# Patient Record
Sex: Female | Born: 1935 | Race: White | Hispanic: No | Smoking: Never smoker
Health system: Southern US, Community
[De-identification: ages and names within clinical notes are randomized; demographics above are authoritative.]

## PROBLEM LIST (undated history)

## (undated) DIAGNOSIS — F0390 Unspecified dementia without behavioral disturbance: Secondary | ICD-10-CM

## (undated) DIAGNOSIS — I2510 Atherosclerotic heart disease of native coronary artery without angina pectoris: Secondary | ICD-10-CM

## (undated) DIAGNOSIS — I10 Essential (primary) hypertension: Secondary | ICD-10-CM

## (undated) DIAGNOSIS — IMO0001 Reserved for inherently not codable concepts without codable children: Secondary | ICD-10-CM

## (undated) DIAGNOSIS — E119 Type 2 diabetes mellitus without complications: Secondary | ICD-10-CM

## (undated) DIAGNOSIS — E7800 Pure hypercholesterolemia, unspecified: Secondary | ICD-10-CM

## (undated) DIAGNOSIS — I639 Cerebral infarction, unspecified: Secondary | ICD-10-CM

## (undated) DIAGNOSIS — I509 Heart failure, unspecified: Secondary | ICD-10-CM

## (undated) DIAGNOSIS — N189 Chronic kidney disease, unspecified: Secondary | ICD-10-CM

## (undated) DIAGNOSIS — M47816 Spondylosis without myelopathy or radiculopathy, lumbar region: Secondary | ICD-10-CM

## (undated) DIAGNOSIS — Z794 Long term (current) use of insulin: Secondary | ICD-10-CM

## (undated) HISTORY — PX: APPENDECTOMY: SHX54

## (undated) HISTORY — PX: KNEE SURGERY: SHX244

## (undated) HISTORY — DX: Atherosclerotic heart disease of native coronary artery without angina pectoris: I25.10

## (undated) HISTORY — DX: Long term (current) use of insulin: Z79.4

## (undated) HISTORY — DX: Reserved for inherently not codable concepts without codable children: IMO0001

## (undated) HISTORY — DX: Type 2 diabetes mellitus without complications: E11.9

## (undated) HISTORY — DX: Cerebral infarction, unspecified: I63.9

## (undated) HISTORY — DX: Chronic kidney disease, unspecified: N18.9

---

## 1994-11-28 DIAGNOSIS — I639 Cerebral infarction, unspecified: Secondary | ICD-10-CM

## 1994-11-28 HISTORY — DX: Cerebral infarction, unspecified: I63.9

## 2001-11-28 DIAGNOSIS — I2510 Atherosclerotic heart disease of native coronary artery without angina pectoris: Secondary | ICD-10-CM

## 2001-11-28 HISTORY — DX: Atherosclerotic heart disease of native coronary artery without angina pectoris: I25.10

## 2002-01-22 ENCOUNTER — Encounter: Payer: Self-pay | Admitting: Emergency Medicine

## 2002-01-22 ENCOUNTER — Inpatient Hospital Stay (HOSPITAL_COMMUNITY): Admission: EM | Admit: 2002-01-22 | Discharge: 2002-01-25 | Payer: Self-pay | Admitting: Emergency Medicine

## 2002-01-25 ENCOUNTER — Encounter: Payer: Self-pay | Admitting: Cardiology

## 2002-04-02 ENCOUNTER — Ambulatory Visit (HOSPITAL_COMMUNITY): Admission: RE | Admit: 2002-04-02 | Discharge: 2002-04-02 | Payer: Self-pay | Admitting: Unknown Physician Specialty

## 2002-04-09 ENCOUNTER — Emergency Department (HOSPITAL_COMMUNITY): Admission: EM | Admit: 2002-04-09 | Discharge: 2002-04-09 | Payer: Self-pay | Admitting: Emergency Medicine

## 2003-05-27 ENCOUNTER — Ambulatory Visit (HOSPITAL_COMMUNITY): Admission: RE | Admit: 2003-05-27 | Discharge: 2003-05-27 | Payer: Self-pay | Admitting: Obstetrics & Gynecology

## 2003-05-27 ENCOUNTER — Encounter: Payer: Self-pay | Admitting: Obstetrics & Gynecology

## 2003-06-12 ENCOUNTER — Ambulatory Visit (HOSPITAL_COMMUNITY): Admission: RE | Admit: 2003-06-12 | Discharge: 2003-06-12 | Payer: Self-pay | Admitting: Obstetrics & Gynecology

## 2004-11-02 ENCOUNTER — Ambulatory Visit: Payer: Self-pay | Admitting: Family Medicine

## 2005-02-14 ENCOUNTER — Ambulatory Visit: Payer: Self-pay | Admitting: Family Medicine

## 2005-03-28 ENCOUNTER — Ambulatory Visit: Payer: Self-pay | Admitting: Family Medicine

## 2005-07-27 ENCOUNTER — Ambulatory Visit: Payer: Self-pay | Admitting: Cardiology

## 2005-08-10 ENCOUNTER — Ambulatory Visit: Payer: Self-pay

## 2005-10-28 ENCOUNTER — Ambulatory Visit: Payer: Self-pay | Admitting: Cardiology

## 2007-09-26 ENCOUNTER — Ambulatory Visit: Payer: Self-pay | Admitting: Cardiology

## 2007-09-27 ENCOUNTER — Ambulatory Visit: Payer: Self-pay | Admitting: Cardiology

## 2007-09-27 ENCOUNTER — Inpatient Hospital Stay (HOSPITAL_COMMUNITY): Admission: AD | Admit: 2007-09-27 | Discharge: 2007-09-30 | Payer: Self-pay | Admitting: Cardiology

## 2007-10-17 ENCOUNTER — Ambulatory Visit: Payer: Self-pay | Admitting: Cardiology

## 2008-01-17 ENCOUNTER — Ambulatory Visit: Payer: Self-pay | Admitting: Cardiology

## 2008-01-17 ENCOUNTER — Ambulatory Visit: Payer: Self-pay | Admitting: Cardiovascular Disease

## 2008-02-13 ENCOUNTER — Ambulatory Visit: Payer: Self-pay | Admitting: Cardiology

## 2008-02-21 ENCOUNTER — Ambulatory Visit: Payer: Self-pay

## 2008-04-30 ENCOUNTER — Ambulatory Visit: Payer: Self-pay | Admitting: Cardiology

## 2008-12-05 DIAGNOSIS — E785 Hyperlipidemia, unspecified: Secondary | ICD-10-CM

## 2008-12-05 DIAGNOSIS — I2510 Atherosclerotic heart disease of native coronary artery without angina pectoris: Secondary | ICD-10-CM | POA: Insufficient documentation

## 2009-02-19 ENCOUNTER — Ambulatory Visit: Payer: Self-pay | Admitting: Cardiology

## 2009-03-31 ENCOUNTER — Encounter: Payer: Self-pay | Admitting: Cardiology

## 2009-09-09 ENCOUNTER — Ambulatory Visit: Payer: Self-pay | Admitting: Cardiology

## 2009-09-24 ENCOUNTER — Encounter (INDEPENDENT_AMBULATORY_CARE_PROVIDER_SITE_OTHER): Payer: Self-pay | Admitting: *Deleted

## 2010-03-09 ENCOUNTER — Ambulatory Visit: Payer: Self-pay | Admitting: Cardiology

## 2010-03-09 DIAGNOSIS — R609 Edema, unspecified: Secondary | ICD-10-CM

## 2010-03-09 DIAGNOSIS — I679 Cerebrovascular disease, unspecified: Secondary | ICD-10-CM

## 2010-03-26 ENCOUNTER — Ambulatory Visit (HOSPITAL_COMMUNITY): Admission: RE | Admit: 2010-03-26 | Discharge: 2010-03-26 | Payer: Self-pay | Admitting: Cardiology

## 2010-03-26 ENCOUNTER — Ambulatory Visit: Payer: Self-pay

## 2010-03-26 ENCOUNTER — Ambulatory Visit: Payer: Self-pay | Admitting: Cardiology

## 2010-03-26 ENCOUNTER — Ambulatory Visit: Payer: Self-pay | Admitting: Internal Medicine

## 2010-04-02 ENCOUNTER — Telehealth: Payer: Self-pay | Admitting: Cardiology

## 2010-04-04 LAB — CONVERTED CEMR LAB
BUN: 31 mg/dL — ABNORMAL HIGH (ref 6–23)
CO2: 30 meq/L (ref 19–32)
Calcium: 9.7 mg/dL (ref 8.4–10.5)
Chloride: 107 meq/L (ref 96–112)
Creatinine, Ser: 1.7 mg/dL — ABNORMAL HIGH (ref 0.4–1.2)
GFR calc non Af Amer: 31.07 mL/min (ref >60)
Glucose, Bld: 80 mg/dL (ref 70–99)
Potassium: 4.4 meq/L (ref 3.5–5.1)
Sodium: 142 meq/L (ref 135–145)

## 2010-04-22 ENCOUNTER — Telehealth (INDEPENDENT_AMBULATORY_CARE_PROVIDER_SITE_OTHER): Payer: Self-pay | Admitting: *Deleted

## 2010-06-19 ENCOUNTER — Inpatient Hospital Stay (HOSPITAL_COMMUNITY)
Admission: EM | Admit: 2010-06-19 | Discharge: 2010-06-20 | Payer: Self-pay | Source: Home / Self Care | Admitting: Emergency Medicine

## 2010-06-19 ENCOUNTER — Encounter (INDEPENDENT_AMBULATORY_CARE_PROVIDER_SITE_OTHER): Payer: Self-pay | Admitting: Internal Medicine

## 2010-06-19 ENCOUNTER — Ambulatory Visit: Payer: Self-pay | Admitting: Vascular Surgery

## 2010-07-04 ENCOUNTER — Observation Stay (HOSPITAL_COMMUNITY): Admission: EM | Admit: 2010-07-04 | Discharge: 2010-07-05 | Payer: Self-pay | Admitting: Emergency Medicine

## 2010-07-04 ENCOUNTER — Ambulatory Visit: Payer: Self-pay | Admitting: Internal Medicine

## 2010-07-05 ENCOUNTER — Encounter: Payer: Self-pay | Admitting: Cardiology

## 2010-07-16 ENCOUNTER — Encounter: Payer: Self-pay | Admitting: Physician Assistant

## 2010-07-20 ENCOUNTER — Ambulatory Visit: Payer: Self-pay | Admitting: Cardiology

## 2010-07-20 ENCOUNTER — Encounter: Payer: Self-pay | Admitting: Physician Assistant

## 2010-07-20 ENCOUNTER — Ambulatory Visit: Payer: Self-pay | Admitting: Cardiovascular Disease

## 2010-07-20 DIAGNOSIS — R7982 Elevated C-reactive protein (CRP): Secondary | ICD-10-CM

## 2010-07-20 DIAGNOSIS — R002 Palpitations: Secondary | ICD-10-CM

## 2010-07-20 DIAGNOSIS — I5032 Chronic diastolic (congestive) heart failure: Secondary | ICD-10-CM

## 2010-07-23 ENCOUNTER — Emergency Department (HOSPITAL_COMMUNITY): Admission: EM | Admit: 2010-07-23 | Discharge: 2010-07-23 | Payer: Self-pay | Admitting: Emergency Medicine

## 2010-07-30 ENCOUNTER — Telehealth: Payer: Self-pay | Admitting: Cardiology

## 2010-08-04 ENCOUNTER — Ambulatory Visit: Payer: Self-pay | Admitting: Cardiology

## 2010-09-01 ENCOUNTER — Telehealth: Payer: Self-pay | Admitting: Cardiology

## 2010-09-01 ENCOUNTER — Encounter: Payer: Self-pay | Admitting: Cardiology

## 2010-09-13 ENCOUNTER — Other Ambulatory Visit: Payer: Self-pay | Admitting: Obstetrics and Gynecology

## 2010-09-15 ENCOUNTER — Ambulatory Visit: Payer: Self-pay | Admitting: Cardiology

## 2010-09-16 ENCOUNTER — Telehealth (INDEPENDENT_AMBULATORY_CARE_PROVIDER_SITE_OTHER): Payer: Self-pay | Admitting: *Deleted

## 2010-09-29 ENCOUNTER — Telehealth (INDEPENDENT_AMBULATORY_CARE_PROVIDER_SITE_OTHER): Payer: Self-pay | Admitting: Radiology

## 2010-09-30 ENCOUNTER — Ambulatory Visit: Payer: Self-pay

## 2010-09-30 ENCOUNTER — Encounter: Payer: Self-pay | Admitting: Cardiology

## 2010-10-06 ENCOUNTER — Telehealth (INDEPENDENT_AMBULATORY_CARE_PROVIDER_SITE_OTHER): Payer: Self-pay | Admitting: Radiology

## 2010-10-07 ENCOUNTER — Encounter: Payer: Self-pay | Admitting: Cardiology

## 2010-10-07 ENCOUNTER — Ambulatory Visit: Payer: Self-pay

## 2010-10-07 ENCOUNTER — Ambulatory Visit: Payer: Self-pay | Admitting: Cardiology

## 2010-10-07 ENCOUNTER — Encounter (HOSPITAL_COMMUNITY)
Admission: RE | Admit: 2010-10-07 | Discharge: 2010-11-27 | Payer: Self-pay | Source: Home / Self Care | Attending: Cardiology | Admitting: Cardiology

## 2010-11-08 ENCOUNTER — Encounter: Payer: Self-pay | Admitting: Cardiology

## 2010-11-10 ENCOUNTER — Ambulatory Visit: Payer: Self-pay | Admitting: Cardiology

## 2010-12-10 ENCOUNTER — Ambulatory Visit (HOSPITAL_COMMUNITY)
Admission: RE | Admit: 2010-12-10 | Discharge: 2010-12-10 | Payer: Self-pay | Source: Home / Self Care | Attending: Obstetrics and Gynecology | Admitting: Obstetrics and Gynecology

## 2010-12-13 LAB — GLUCOSE, CAPILLARY: Glucose-Capillary: 114 mg/dL — ABNORMAL HIGH (ref 70–99)

## 2010-12-13 LAB — URINALYSIS, ROUTINE W REFLEX MICROSCOPIC
Bilirubin Urine: NEGATIVE
Ketones, ur: NEGATIVE mg/dL
Leukocytes, UA: NEGATIVE
Nitrite: NEGATIVE
Protein, ur: 100 mg/dL — AB
Specific Gravity, Urine: 1.025 (ref 1.005–1.030)
Urine Glucose, Fasting: NEGATIVE mg/dL
Urobilinogen, UA: 0.2 mg/dL (ref 0.0–1.0)
pH: 5.5 (ref 5.0–8.0)

## 2010-12-13 LAB — PROTIME-INR
INR: 0.97 (ref 0.00–1.49)
Prothrombin Time: 13.1 seconds (ref 11.6–15.2)

## 2010-12-13 LAB — COMPREHENSIVE METABOLIC PANEL
ALT: 11 U/L (ref 0–35)
AST: 12 U/L (ref 0–37)
Albumin: 3.7 g/dL (ref 3.5–5.2)
Alkaline Phosphatase: 54 U/L (ref 39–117)
BUN: 29 mg/dL — ABNORMAL HIGH (ref 6–23)
CO2: 27 mEq/L (ref 19–32)
Calcium: 10 mg/dL (ref 8.4–10.5)
Chloride: 104 mEq/L (ref 96–112)
Creatinine, Ser: 1.7 mg/dL — ABNORMAL HIGH (ref 0.4–1.2)
GFR calc Af Amer: 35 mL/min — ABNORMAL LOW (ref >60)
GFR calc non Af Amer: 29 mL/min — ABNORMAL LOW (ref >60)
Glucose, Bld: 140 mg/dL — ABNORMAL HIGH (ref 70–99)
Potassium: 4.2 mEq/L (ref 3.5–5.1)
Sodium: 138 mEq/L (ref 135–145)
Total Bilirubin: 0.7 mg/dL (ref 0.3–1.2)
Total Protein: 6.9 g/dL (ref 6.0–8.3)

## 2010-12-13 LAB — CBC
HCT: 34.6 % — ABNORMAL LOW (ref 36.0–46.0)
Hemoglobin: 11.3 g/dL — ABNORMAL LOW (ref 12.0–15.0)
MCH: 28.2 pg (ref 26.0–34.0)
MCHC: 32.7 g/dL (ref 30.0–36.0)
MCV: 86.3 fL (ref 78.0–100.0)
Platelets: 284 10*3/uL (ref 150–400)
RBC: 4.01 MIL/uL (ref 3.87–5.11)
RDW: 13.5 % (ref 11.5–15.5)
WBC: 4.9 10*3/uL (ref 4.0–10.5)

## 2010-12-13 LAB — URINE MICROSCOPIC-ADD ON

## 2010-12-13 LAB — APTT: aPTT: 32 seconds (ref 24–37)

## 2010-12-30 NOTE — Assessment & Plan Note (Signed)
Summary: [facility]  Medications Added METOPROLOL SUCCINATE 50 MG XR24H-TAB (METOPROLOL SUCCINATE) 1 by mouth two times a day DILT-XR 120 MG XR24H-CAP (DILTIAZEM HCL) one daily      Allergies Added:   Visit Type:  Follow-up Primary Provider:  Dr. Adela Glimpse.  CC:  Palpiations.  History of Present Illness: The patient presents for followup of palpitations. Her last visit I had her wear an event monitor. She has had documented PVCs. I did increase her beta blocker to 50 mg b.i.d. She has occasionally taken t.i.d. and still ill spell the patient. She says her heart rate goes up if she walks up the stairs. She'll stop and he gets better and she can continue. She does not have any new dyspnea, PND or orthopnea. She has not had any new chest pressure, neck or arm discomfort. She denies any weight gain that she has had some lower extremity edema. She was told when she was hospitalized earlier this year to hold her diuretic. She does have renal insufficiency. However, she has been dosing with Lasix p.r.n. lower extremity edema.  Apparently the patient needs a D&C. She is presenting here for followup of palpitations and preoperative clearance.  Current Medications (verified): 1)  Aspirin 81 Mg  Tabs (Aspirin) .Marland Kitchen.. 1 By Mouth Daily 2)  Amlodipine Besylate 10 Mg Tabs (Amlodipine Besylate) .Marland Kitchen.. 1 By Mouth Dialy 3)  Isosorbide Mononitrate Cr 60 Mg Xr24h-Tab (Isosorbide Mononitrate) .Marland Kitchen.. 1 By Mouth Daily 4)  Furosemide 20 Mg Tabs (Furosemide) .... One Daily 5)  Metoprolol Succinate 50 Mg Xr24h-Tab (Metoprolol Succinate) .Marland Kitchen.. 1 By Mouth Two Times A Day 6)  Novolin 70/30 70-30 % Susp (Insulin Isophane & Regular) .... Take As Directed  Allergies (verified): 1)  ! Darvocet  Past History:  Past Medical History: CAD, NATIVE VESSEL (ICD-414.01)(Two-vessel PCI of the first OM branch and of the   first obtuse marginal and the mid-LAD. 2008) CVA (ICD-434.91) HYPERTENSION, BENIGN  (ICD-401.1) HYPERLIPIDEMIA-MIXED (ICD-272.4) Hypertrophic cardiomyopathy  Review of Systems       As stated in the HPI and negative for all other systems.   Vital Signs:  Patient profile:   77 year old female Height:      64 inches Weight:      162 pounds BMI:     27.91 Pulse rate:   66 / minute Resp:     18 per minute BP sitting:   158 / 66  (right arm)  Vitals Entered By: Marrion Coy, CNA (September 15, 2010 11:21 AM)  Physical Exam  General:  Well developed, well nourished, in no acute distress. Head:  normocephalic and atraumatic Eyes:  PERRLA/EOM intact; conjunctiva and lids normal. Mouth:  Edentulous. Oral mucosa normal. Neck:  Neck supple, no JVD. No masses, thyromegaly or abnormal cervical nodes. Chest Wall:  no deformities or breast masses noted Lungs:  Clear bilaterally to auscultation and percussion. Abdomen:  Bowel sounds positive; abdomen soft and non-tender without masses, organomegaly, or hernias noted. No hepatosplenomegaly. Msk:  Back normal, normal gait. Muscle strength and tone normal. Extremities:  Mild edema with right varicose vein Neurologic:  Alert and oriented x 3. Skin:  Intact without lesions or rashes. Cervical Nodes:  no significant adenopathy Axillary Nodes:  no significant adenopathy Inguinal Nodes:  no significant adenopathy Psych:  Normal affect.   Detailed Cardiovascular Exam  Neck    Carotids: Carotids full and equal bilaterally with transmitted systolic murmur      Neck Veins: Normal, no JVD.    Heart  Inspection: no deformities or lifts noted.      Palpation: normal PMI with no thrills palpable.      Auscultation: regular rate and rhythm, S1, S2 36 apical systolic murmur radiating out aortic outflow tract  Vascular    Abdominal Aorta: no palpable masses, pulsations, or audible bruits.      Femoral Pulses: normal femoral pulses bilaterally.      Pedal Pulses: normal pedal pulses bilaterally.      Radial Pulses: normal radial  pulses bilaterally.      Peripheral Circulation: no clubbing, cyanosis,  with normal capillary refill.     EKG  Procedure date:  09/15/2010  Findings:      Sinus rhythm, rate 67, axis within normal limits, intervals within normal limits, poor anterior R-wave progression  Impression & Recommendations:  Problem # 1:  PALPITATIONS (ICD-785.1)  She has had PVCs. I will discontinue her Norvasc and add 120 mg of Cardizem CD. She will stay on metoprolol XL 50 b.i.d. She is not to self medicate.  Orders: EKG w/ Interpretation (93000)  Problem # 2:  PREOPERATIVE EXAMINATION (ICD-V72.84) She would need stress perfusion imaging prior to elective surgery. I reviewed her coronary anatomy from her previous catheterization she had multiple areas of small vessel and nonobstructive age as well as the disease treated. We would need to risk stratify.  Problem # 3:  EDEMA (ICD-782.3) We discussed using diuretics that she needs to do this sparingly given no insufficiency. Her creatinine done 2 days ago was 1.86.  Problem # 4:  DIASTOLIC HEART FAILURE, CHRONIC (ICD-428.32) She seems to be euvolemic and we will manage volume with salt and fluid restriction and diuretics sparingly.  Problem # 5:  HOCM (ICD-425.1) She does have septal hypertrophy which will be managed clinically.  Patient Instructions: 1)  Your physician recommends that you schedule a follow-up appointment on 11/10/2010 at 10:30 am in [place] 2)  Your physician has recommended you make the following change in your medication: Stop amlodipine and start Diltiazem 120 a day 3)  Your physician has requested that you have an exercise stress myoview.  For further information please visit [email or web address]  Please follow instruction sheet, as given. Prescriptions: DILT-XR 120 MG XR24H-CAP (DILTIAZEM HCL) one daily  #30 x 6   Entered by:   Charolotte Capuchin, RN   Authorized by:   Rollene Rotunda, MD, [facility]   Signed by:   Charolotte Capuchin, RN on 09/16/2010   Method used:   Faxed to ...       Hospital doctor (retail)       [address]       [place], [place]  [address]       Ph: [phone] or [phone]       Fax: [phone]   RxID:   [id number]

## 2010-12-30 NOTE — Progress Notes (Signed)
   Phone Note From Other Clinic   Caller: Jan/[facility] Initial call taken by: KM    Faxed Labs,12 Lead over to [phone] [facility]  Apr 22, 2010 10:34 AM

## 2010-12-30 NOTE — Assessment & Plan Note (Signed)
Summary: [name]  Medications Added FUROSEMIDE 20 MG TABS (FUROSEMIDE) OUT LIVALO 4 MG TABS (PITAVASTATIN CALCIUM) 1 by mouth daily NORVASC 10 MG TABS (AMLODIPINE BESYLATE) 1 by mouth daily      Allergies Added:   Visit Type:  Follow-up Primary Provider:  Dr. Elana Alm  CC:  CAD.  History of Present Illness: The patient presents for follow up of coronary artery disease, hypertrophic cardiomyopathy and palpitations. At her last visit I switched her from Norvasc to Cardizem for treatment of her rapid heart rate. She says she feels much better on this. She denies any palpitations, presyncope or syncope. She is having less shortness of breath and has more energy. She has not had any chest discomfort, neck or arm discomfort. She has had increased lower extremity swelling the patient out of her Lasix q. day. However, she is not describing PND or orthopnea.  Current Medications (verified): 1)  Aspirin 81 Mg  Tabs (Aspirin) .Marland Kitchen.. 1 By Mouth Daily 2)  Isosorbide Mononitrate Cr 60 Mg Xr24h-Tab (Isosorbide Mononitrate) .Marland Kitchen.. 1 By Mouth Daily 3)  Furosemide 20 Mg Tabs (Furosemide) .... Out 4)  Novolin 70/30 70-30 % Susp (Insulin Isophane & Regular) .... Take As Directed 5)  Dilt-Xr 120 Mg Xr24h-Cap (Diltiazem Hcl) .... One Daily 6)  Livalo 4 Mg Tabs (Pitavastatin Calcium) .Marland Kitchen.. 1 By Mouth Daily 7)  Norvasc 10 Mg Tabs (Amlodipine Besylate) .Marland Kitchen.. 1 By Mouth Daily  Allergies (verified): 1)  ! Darvocet  Past History:  Past Medical History: Reviewed history from 09/15/2010 and no changes required. CAD, NATIVE VESSEL (ICD-414.01)(Two-vessel PCI of the first OM branch and of the   first obtuse marginal and the mid-LAD. 2008) CVA (ICD-434.91) HYPERTENSION, BENIGN (ICD-401.1) HYPERLIPIDEMIA-MIXED (ICD-272.4) Hypertrophic cardiomyopathy  Past Surgical History: Reviewed history from 12/05/2008 and no changes required. Hysterectomy Appendecotmy R Knee Surgery  Review of Systems   As stated in the HPI and negative for all other systems.   Vital Signs:  Patient profile:   77 year old female Height:      64 inches Weight:      165 pounds BMI:     28.42 Pulse rate:   78 / minute Resp:     16 per minute BP sitting:   150 / 72  (right arm)  Vitals Entered By: Marrion Coy, CNA (November 10, 2010 10:34 AM)  Physical Exam  General:  Well developed, well nourished, in no acute distress. Head:  normocephalic and atraumatic Eyes:  PERRLA/EOM intact; conjunctiva and lids normal. Neck:  Neck supple, no JVD. No masses, thyromegaly or abnormal cervical nodes. Chest Wall:  no deformities  Lungs:  Clear bilaterally to auscultation and percussion. Abdomen:  Bowel sounds positive; abdomen soft and non-tender without masses, organomegaly, or hernias noted. No hepatosplenomegaly. Msk:  Back normal, normal gait. Muscle strength and tone normal. Extremities:  moderate, edema with right varicose vein Neurologic:  Alert and oriented x 3. Skin:  Intact without lesions or rashes. Cervical Nodes:  no significant adenopathy Axillary Nodes:  no significant adenopathy Inguinal Nodes:  no significant adenopathy Psych:  Normal affect.   Detailed Cardiovascular Exam  Neck    Carotids: Carotids full and equal bilaterally with transmitted systolic murmur      Neck Veins: Normal, no JVD.    Heart    Inspection: no deformities or lifts noted.      Palpation: normal PMI with no thrills palpable.      Auscultation: regular rate and rhythm, S1, S2 36 apical systolic murmur  radiating out aortic outflow tract  Vascular    Abdominal Aorta: no palpable masses, pulsations, or audible bruits.      Femoral Pulses: normal femoral pulses bilaterally.      Pedal Pulses: normal pedal pulses bilaterally.      Radial Pulses: normal radial pulses bilaterally.      Peripheral Circulation: no clubbing, cyanosis,  with normal capillary refill.     Impression & Recommendations:  Problem # 1:   PALPITATIONS (ICD-785.1) This is much improved on the current regimen and she will continue with this.  Problem # 2:  EDEMA (ICD-782.3) She is to restart her Lasix at 20 mg daily. I have suggested compression stockings if she has continued edema. I. need to avoid overdiuresis with her renal insufficiency.  Problem # 3:  PREOPERATIVE EXAMINATION (ICD-V72.84) Patient did have a recent stress perfusion study demonstrating a well-preserved ejection fraction and no high-risk findings. This was in anticipation of a D&C. However, she is deferring surgery so her own accord.  Problem # 4:  HYPERTENSION, BENIGN (ICD-401.1) her blood pressure is controlled on meds as listed.  Patient Instructions: 1)  Your physician wants you to follow-up in:  6 months.  You will receive a reminder letter in the mail two months in advance. If you don't receive a letter, please call our office to schedule the follow-up appointment.

## 2010-12-30 NOTE — Progress Notes (Signed)
Summary: lab results     Phone Note Call from Patient Call back at Home Phone [phone]   Caller: Patient Reason for Call: Lab or Test Results Summary of Call: request lab results Initial call taken by: Migdalia Dk,  Apr 02, 2010 10:26 AM  Follow-up for Phone Call        spoke w/ pt.  She was still c/o feet swelling and she stopped her Simvastatin because a neighbor told her it would help.  She thinks it is some better today.  I gave her the results of her labs, echo, and carotid ultrasound but told her that Dr Antoine Poche had to review both the echo and carotid. She request that when you call her to please call her in the morning before 9am.   Dennis Bast, RN, BSN  Apr 02, 2010 11:18 AM  Additional Follow-up for Phone Call Additional follow up Details #1::        Reduce Norvasc to 5 mg.  Edema likely related to amlodipine.  Needs to keep a BP diary as other meds will likely need to be increased. Additional Follow-up by: Rollene Rotunda, MD, [facility],  Apr 04, 2010 12:50 PM    Additional Follow-up for Phone Call Additional follow up Details #2::    NA at home number 2:51pm 04/06/2010  Sander Nephew, RN

## 2010-12-30 NOTE — Letter (Signed)
Summary: [facility]   [facility]   Imported By: Roderic Ovens 09/10/2010 15:07:04  _____________________________________________________________________  External Attachment:    Type:   Image     Comment:   External Document

## 2010-12-30 NOTE — Assessment & Plan Note (Signed)
Summary: rov per pt call/jss  Medications Added CARVEDILOL 12.5 MG TABS (CARVEDILOL) one twice a day ASPIRIN 81 MG  TABS (ASPIRIN) 1 by mouth daily PLAVIX 75 MG TABS (CLOPIDOGREL BISULFATE) 1 by mouth daily HYDROCHLOROTHIAZIDE 25 MG TABS (HYDROCHLOROTHIAZIDE) 1 by mouth daily BENAZEPRIL HCL 40 MG TABS (BENAZEPRIL HCL) 1 by mouth two times a day SIMVASTATIN 40 MG TABS (SIMVASTATIN) 1 by mouth daily AMLODIPINE BESYLATE 10 MG TABS (AMLODIPINE BESYLATE) 1 by mouth dialy ISOSORBIDE MONONITRATE CR 60 MG XR24H-TAB (ISOSORBIDE MONONITRATE) 1 by mouth daily ETODOLAC 200 MG CAPS (ETODOLAC) 1 by mouth two times a day FUROSEMIDE 20 MG TABS (FUROSEMIDE) one daily POTASSIUM CHLORIDE CR 10 MEQ CR-TABS (POTASSIUM CHLORIDE) one daily      Allergies Added: ! DARVOCET  Visit Type:  Follow-up Primary Provider:  Dr. Adela Glimpse.  CC:  Edema and CAD.  History of Present Illness: The patient presents for followup of the above. Since I last saw her she occasionally will get some chest discomfort. This is sporadic and stable. He goes away quickly. She has not needed to take any nitroglycerin. She denies any resting symptoms that radiate to her neck or arms. She's had no associated nausea vomiting or diaphoresis. She's had no new palpitations, presyncope or syncope. She can go up and down steps without significant limitations. Her most significant concern his lower extremity edema. This is improved when she wakes up in the morning but returns quickly. She says she avoids salt. Of note at the time of her last visit she was not taking carvedilol for unclear reasons. This was restarted and she's had no problems with this. She does say her blood pressure still runs slightly high.  Current Medications (verified): 1)  Coreg 6.25 Mg Tabs (Carvedilol) .Marland Kitchen.. 1 By Mouth Two Times A Day 2)  Aspirin 81 Mg  Tabs (Aspirin) .Marland Kitchen.. 1 By Mouth Daily 3)  Plavix 75 Mg Tabs (Clopidogrel Bisulfate) .Marland Kitchen.. 1 By Mouth Daily 4)   Hydrochlorothiazide 25 Mg Tabs (Hydrochlorothiazide) .Marland Kitchen.. 1 By Mouth Daily 5)  Benazepril Hcl 40 Mg Tabs (Benazepril Hcl) .Marland Kitchen.. 1 By Mouth Two Times A Day 6)  Simvastatin 40 Mg Tabs (Simvastatin) .Marland Kitchen.. 1 By Mouth Daily 7)  Amlodipine Besylate 10 Mg Tabs (Amlodipine Besylate) .Marland Kitchen.. 1 By Mouth Dialy 8)  Isosorbide Mononitrate Cr 60 Mg Xr24h-Tab (Isosorbide Mononitrate) .Marland Kitchen.. 1 By Mouth Daily 9)  Etodolac 200 Mg Caps (Etodolac) .Marland Kitchen.. 1 By Mouth Two Times A Day  Allergies (verified): 1)  ! Darvocet  Past History:  Past Medical History: Reviewed history from 12/05/2008 and no changes required. Current Problems:  CAD, NATIVE VESSEL (ICD-414.01) CVA (ICD-434.91) HYPERTENSION, BENIGN (ICD-401.1) HYPERLIPIDEMIA-MIXED (ICD-272.4)  Past Surgical History: Reviewed history from 12/05/2008 and no changes required. Hysterectomy Appendecotmy R Knee Surgery  Review of Systems       As stated in the HPI and negative for all other systems.   Vital Signs:  Patient profile:   77 year old female Height:      64 inches Weight:      180 pounds BMI:     31.01 Pulse rate:   70 / minute Resp:     18 per minute BP sitting:   142 / 62  (right arm)  Vitals Entered By: Marrion Coy, CNA (March 09, 2010 11:58 AM)  Physical Exam  General:  Well developed, well nourished, in no acute distress. Head:  normocephalic and atraumatic Eyes:  PERRLA/EOM intact; conjunctiva and lids normal. Mouth:  edentulous, otherwise unremarkable Neck:  Mild jugular venous distention, weight form within normal limits, no thyromegaly, right carotid bruit Chest Wall:  no deformities or breast masses noted Lungs:  Clear bilaterally to auscultation and percussion. Abdomen:  Bowel sounds positive; abdomen soft and non-tender without masses, organomegaly, or hernias noted. No hepatosplenomegaly. Msk:  Back normal, normal gait. Muscle strength and tone normal. Extremities:  moderate bilateral lower extremity edema with right  varicosities Neurologic:  Alert and oriented x 3. Skin:  Intact without lesions or rashes. Cervical Nodes:  no significant adenopathy Axillary Nodes:  no significant adenopathy Psych:  Normal affect.   Detailed Cardiovascular Exam  Neck    Carotids: right carotid bruit:.      Neck Veins: Mild jugular venous distention at 45  Heart    Inspection: no deformities or lifts noted.      Palpation: normal PMI with no thrills palpable.      Auscultation: S1 and S2 within normal limits, no S3, no S4, no clicks, no rubs, 2/6 apical systolic murmur radiating slightly at the aortic outflow tract, no diastolic murmurs  Vascular    Abdominal Aorta: no palpable masses, pulsations, or audible bruits.      Femoral Pulses: normal femoral pulses bilaterally.      Pedal Pulses: normal pedal pulses bilaterally.      Radial Pulses: normal radial pulses bilaterally.      Peripheral Circulation: no clubbing, cyanosis, or edema noted with normal capillary refill.     EKG  Procedure date:  03/09/2010  Findings:      sinus rhythm, rate 70, LVH by voltage criteria, slightly prolonged QT interval, poor anterior R-wave progression, nonspecific lateral T-wave changes  Impression & Recommendations:  Problem # 1:  EDEMA (ICD-782.3) Her most significant complaint is edema. Today I will discontinue the hydrochlorothiazide, and start Lasix 20 mg daily and potassium 10 mEq daily. She will get a basic metabolic profile in 10 days. She will keep her feet elevated. Of note this edema could be related to the Norvasc in which case he will not likely go away with diuretics.  I will also be checking an echocardiogram to evaluate the possibility of pulmonary hypertension as she had some mild evidence of this previously.  Problem # 2:  CAROTID BRUIT (ICD-785.9) She will get carotid Dopplers. Orders: Carotid Duplex (Carotid Duplex)  Problem # 3:  CAD, NATIVE VESSEL (ICD-414.01) She has had coronary intervention. She  is having no new symptoms. Further cardiovascular testing is not indicated. She will continue with risk reduction. Orders: EKG w/ Interpretation (93000)  Other Orders: Echocardiogram (Echo)  Patient Instructions: 1)  Your physician recommends that you schedule a follow-up appointment as instructed 2)  Your physician recommends that you return for lab work in: 2 weeks for BMP  401.1 v58.69 3)  Your physician has recommended you make the following change in your medication: Increase Carvedilol to 12.5 mg twice a day, Stop HCTZ and start Lasix 20 mg a day, start potassium chlo 10 mEq a day 4)  Your physician has requested that you have a carotid duplex. This test is an ultrasound of the carotid arteries in your neck. It looks at blood flow through these arteries that supply the brain with blood. Allow one hour for this exam. There are no restrictions or special instructions. 5)  Your physician has requested that you have an echocardiogram.  Echocardiography is a painless test that uses sound waves to create images of your heart. It provides your doctor with information about the  size and shape of your heart and how well your heart's chambers and valves are working.  This procedure takes approximately one hour. There are no restrictions for this procedure. Prescriptions: CARVEDILOL 12.5 MG TABS (CARVEDILOL) one twice a day  #60 x 6   Entered by:   Charolotte Capuchin, RN   Authorized by:   Rollene Rotunda, MD, [facility]   Signed by:   Charolotte Capuchin, RN on 03/09/2010   Method used:   Faxed to ...       Hospital doctor (retail)       [address]       [place], [place]  [address]       Ph: [phone] or [phone]       Fax: [phone]   RxID:   [id number] POTASSIUM CHLORIDE CR 10 MEQ CR-TABS (POTASSIUM CHLORIDE) one daily  #30 x 6   Entered by:   Charolotte Capuchin, RN   Authorized by:   Rollene Rotunda, MD, [facility]   Signed by:   Charolotte Capuchin, RN on 03/09/2010   Method used:   Faxed to ...       Hospital doctor (retail)       [address]       [place], [place]  [address]       Ph: [phone] or [phone]       Fax: [phone]   RxID:   [id number] FUROSEMIDE 20 MG TABS (FUROSEMIDE) one daily  #30 x 6   Entered by:   Charolotte Capuchin, RN   Authorized by:   Rollene Rotunda, MD, [facility]   Signed by:   Charolotte Capuchin, RN on 03/09/2010   Method used:   Faxed to ...       Hospital doctor (retail)       [address]       [place], [place]  [address]       Ph: [phone] or [phone]       Fax: [phone]   RxID:   [id number]

## 2010-12-30 NOTE — Assessment & Plan Note (Signed)
Summary: [facility]  Medications Added METOPROLOL SUCCINATE 50 MG XR24H-TAB (METOPROLOL SUCCINATE) one twice a day      Allergies Added:  beneath urine is in nowas she is in no acute knees is an 80 mg twice a day okay so the small slices and she's not confused slow there's no main is masses is Vioxx is in place  Visit Type:  Follow-up Primary Provider:  Dr. Adela Glimpse.  CC:  palpitations.  History of Present Illness: The patient presents for followup of palpitations. She was seen last by our physician's assistant after a brief hospitalization for chest pain. She was having frequent ectopy. She continues to feel palpitations. She says these have been throughout the day and particularly bothersome at night. She feels her heart "skipping". She does not describe chest pressure, neck or arm discomfort. She does not describe shortness of breath, PND or orthopnea. She does not have weight gain or edema. There has been some confusion about her discharge medications and we are reviewing this thoroughly today.  Of note the patient wore an event monitor which I reviewed today. She has sinus tachycardia with frequent ectopy. She sometimes has ectopy in a bigeminal pattern. She had no sustained pauses. There was no atrial fibrillation or flutter noted.  Current Medications (verified): 1)  Aspirin 81 Mg  Tabs (Aspirin) .Marland Kitchen.. 1 By Mouth Daily 2)  Amlodipine Besylate 10 Mg Tabs (Amlodipine Besylate) .Marland Kitchen.. 1 By Mouth Dialy 3)  Isosorbide Mononitrate Cr 60 Mg Xr24h-Tab (Isosorbide Mononitrate) .Marland Kitchen.. 1 By Mouth Daily 4)  Furosemide 20 Mg Tabs (Furosemide) .... One Daily 5)  Norethindrone Acetate 5 Mg Tabs (Norethindrone Acetate) .... Take 2 Tablets Daily 6)  Metoprolol Succinate 50 Mg Xr24h-Tab (Metoprolol Succinate) .... One Twice A Day 7)  Novolin 70/30 70-30 % Susp (Insulin Isophane & Regular) .... Take As Directed  Allergies (verified): 1)  ! Darvocet  Past History:  Past Medical History: CAD,  NATIVE VESSEL (ICD-414.01)(PTCA and Cypher stenting of the mid LAD 2008) CVA (ICD-434.91) HYPERTENSION, BENIGN (ICD-401.1) HYPERLIPIDEMIA-MIXED (ICD-272.4)  Past Surgical History: Reviewed history from 12/05/2008 and no changes required. Hysterectomy Appendecotmy R Knee Surgery  Review of Systems       As stated in the HPI and negative for all other systems.   Vital Signs:  Patient profile:   77 year old female Height:      64 inches Weight:      158 pounds BMI:     27.22 Pulse rate:   52 / minute Resp:     16 per minute BP sitting:   152 / 72  (right arm)  Vitals Entered By: Marrion Coy, CNA (August 04, 2010 1:06 PM)  Physical Exam  General:  Well developed, well nourished, in no acute distress. Head:  normocephalic and atraumatic Eyes:  PERRLA/EOM intact; conjunctiva and lids normal. Mouth:  Edentulous. Oral mucosa normal. Neck:  Neck supple, no JVD. No masses, thyromegaly or abnormal cervical nodes. Chest Wall:  no deformities or breast masses noted Lungs:  Clear bilaterally to auscultation and percussion. Abdomen:  Bowel sounds positive; abdomen soft and non-tender without masses, organomegaly, or hernias noted. No hepatosplenomegaly. Msk:  Back normal, normal gait. Muscle strength and tone normal. Extremities:  No clubbing or cyanosis. Neurologic:  Alert and oriented x 3. Skin:  Intact without lesions or rashes. Cervical Nodes:  no significant adenopathy Axillary Nodes:  no significant adenopathy Inguinal Nodes:  no significant adenopathy Psych:  Normal affect.   Detailed Cardiovascular Exam  Neck    Carotids: Carotids full and equal bilaterally without bruits.      Neck Veins: Normal, no JVD.    Heart    Inspection: no deformities or lifts noted.      Palpation: normal PMI with no thrills palpable.      Auscultation: regular rate and rhythm, S1, S2 without murmurs, rubs, gallops, or clicks.    Vascular    Abdominal Aorta: no palpable masses,  pulsations, or audible bruits.      Femoral Pulses: normal femoral pulses bilaterally.      Pedal Pulses: normal pedal pulses bilaterally.      Radial Pulses: normal radial pulses bilaterally.      Peripheral Circulation: no clubbing, cyanosis, or edema noted with normal capillary refill.     Impression & Recommendations:  Problem # 1:  PALPITATIONS (ICD-785.1) She is having frequent ectopy. At this point I will increase her metoprolol to 50 mg XL b.i.d. If she continues to have these symptoms she will let us know.  Problem # 2:  DIASTOLIC HEART FAILURE, CHRONIC (ICD-428.32) She seems to be euvolemic. She was sent home off of diuretics. Other medicines were apparently not refillable in the pharmacy. We have clarified this and she will remain on the meds as listed.  Problem # 3:  HYPERTENSION, BENIGN (ICD-401.1) Her blood pressure is well controlled. She will continue the meds as listed.  Patient Instructions: 1)  Your physician recommends that you schedule a follow-up appointment in: 1 month with Dr Antoine Poche 2)  Your physician has recommended you make the following change in your medication: Increase Metoprolol to 50 mg twice daily Prescriptions: AMLODIPINE BESYLATE 10 MG TABS (AMLODIPINE BESYLATE) 1 by mouth dialy  #30 x 11   Entered by:   Charolotte Capuchin, RN   Authorized by:   Rollene Rotunda, MD, [facility]   Signed by:   Charolotte Capuchin, RN on 08/04/2010   Method used:   Faxed to ...       [facility]ospital [facility] (retail)       [address]       [place], [place]  [address]       Ph: [phone] or [phone]       Fax: [phone]   RxID:   [id number] ISOSORBIDE MONONITRATE CR 60 MG XR24H-TAB (ISOSORBIDE MONONITRATE) 1 by mouth daily  #30 x 11   Entered by:   Charolotte Capuchin, RN   Authorized by:   Rollene Rotunda, MD, [facility]   Signed by:   Charolotte Capuchin, RN on 08/04/2010   Method used:   Faxed to ...       [facility] (retail)       [address]       [place], [place]  [address]       Ph: [phone] or [phone]       Fax: [phone]   RxID:   [id number] METOPROLOL SUCCINATE 50 MG XR24H-TAB (METOPROLOL SUCCINATE) one twice a day  #60 x 6   Entered by:   Charolotte Capuchin, RN   Authorized by:   Rollene Rotunda, MD, [facility]   Signed by:   Charolotte Capuchin, RN on 08/04/2010   Method used:   Faxed to ...       Hospital doctor (retail)       [address]  [place], [place]  [address]       Ph: [phone] or [phone]       Fax: [phone]   RxID:   [id number]

## 2010-12-30 NOTE — Progress Notes (Signed)
Summary: nuc pre-procedure  Phone Note Outgoing Call   Call placed by: Domenic Polite, CNMT,  October 06, 2010 10:32 AM Call placed to: Patient Reason for Call: Confirm/change Appt Summary of Call: Reviewed information on [employer] Information Sheet (see scanned document for further details).  Spoke with patient.  Initial call taken by: Domenic Polite, CNMT,  October 06, 2010 10:32 AM     Nuclear Med Background Indications for Stress Test: Evaluation for Ischemia, Surgical Clearance, Stent Patency  Indications Comments: Pre-Op clearance for D&C - Dr. Elana Alm  History: COPD, Heart Catheterization, Myocardial Perfusion Study, Stents  History Comments: Hypertrophic cardiomyopathy, '03 cath, '06 MPI trivial isch./ EF = 63%, '08 Stents- OM1/LAD  Symptoms: Palpitations    Nuclear Pre-Procedure Cardiac Risk Factors: CVA, Family History - CAD, Hypertension, Lipids Height (in): 64

## 2010-12-30 NOTE — Progress Notes (Signed)
Summary: SURGICAL CLEARANCE NEEDED   Phone Note Other Incoming   Caller: [facility] Details for Reason: CLEARANCE FOR D&C/HYSTERECTOMY Summary of Call: PT HAS APPT 09/15/2010 FOR SURGICAL CLEARANCE WHICH NEEDS TO BE FAXED TO [phone] ATTN DR Isaias Cowman [facility].  SURGERY IS SCHEDULED FOR 09/17/2010 Initial call taken by: Charolotte Capuchin, RN,  September 01, 2010 2:27 PM

## 2010-12-30 NOTE — Progress Notes (Signed)
Summary: Pt calling regarding med & heart monitor/ call back after 3 pm   Phone Note Call from Patient Call back at Home Phone [phone]   Caller: Patient Summary of Call: Pt calling regarding medication Initial call taken by: Judie Grieve,  July 30, 2010 11:09 AM  Follow-up for Phone Call        pt saw PA 2 weeks ago and was told to take Metoprolol 50 mg twice a day and wear an event monitor.  She is concerned because while she has been taking the increase dose of medication she has not seen much of an improvment. She wants a different medication and to not wear the monitor any longer.  Pt aware I will discuss with Dr Antoine Poche and call her back with orders. Follow-up by: Charolotte Capuchin, RN,  July 30, 2010 12:43 PM  Additional Follow-up for Phone Call Additional follow up Details #1::        Per Dr Antoine Poche.  Pt to continue current therapy and to have an appt for eval.  Appt scheduled for [facility] office 08/04/2010 at 1:30.  She is aware and agreeable Additional Follow-up by: Charolotte Capuchin, RN,  July 30, 2010 5:36 PM

## 2010-12-30 NOTE — Progress Notes (Signed)
Summary: nuc pre-procedure  Phone Note Outgoing Call   Call placed by: Domenic Polite, CNMT,  September 29, 2010 4:04 PM Call placed to: Patient Reason for Call: Confirm/change Appt Summary of Call: Tried to reach patient, there was no answer. Initial call taken by: Domenic Polite, CNMT,  September 29, 2010 4:04 PM

## 2010-12-30 NOTE — Miscellaneous (Signed)
  Clinical Lists Changes  Observations: Added new observation of DOPPLER LEG:  No evidence of deep vein or superficial thrombosis involving the     right lower extremity and left lower extremity.   - No evidence of Baker's cyst on the right or left. (06/19/2010 15:03) Added new observation of US CAROTID: Stable, mild carotid artery disease 0-39% bilateral ICA stenosis (03/26/2010 15:04) Added new observation of ECHOINTERP:  - Left ventricle: The cavity size was normal. There was mild focal     basal and moderate concentric hypertrophy of the septum. Systolic     function was normal. The estimated ejection fraction was in the     range of 60% to 65%. Wall motion was normal; there were no     regional wall motion abnormalities. Features are consistent with a     pseudonormal left ventricular filling pattern, with concomitant     abnormal relaxation and increased filling pressure (grade 2     diastolic dysfunction).   - Left atrium: The atrium was mildly dilated. (03/26/2010 15:02)      Echocardiogram  Procedure date:  03/26/2010  Findings:       - Left ventricle: The cavity size was normal. There was mild focal     basal and moderate concentric hypertrophy of the septum. Systolic     function was normal. The estimated ejection fraction was in the     range of 60% to 65%. Wall motion was normal; there were no     regional wall motion abnormalities. Features are consistent with a     pseudonormal left ventricular filling pattern, with concomitant     abnormal relaxation and increased filling pressure (grade 2     diastolic dysfunction).   - Left atrium: The atrium was mildly dilated.  Venous Doppler  Procedure date:  06/19/2010  Findings:       No evidence of deep vein or superficial thrombosis involving the     right lower extremity and left lower extremity.   - No evidence of Baker's cyst on the right or left.  Carotid Doppler  Procedure date:  03/26/2010  Findings:     Stable, mild carotid artery disease 0-39% bilateral ICA stenosis

## 2010-12-30 NOTE — Progress Notes (Signed)
Summary: nuc pre-procedure  Phone Note Outgoing Call   Call placed by: Domenic Polite, CNMT,  September 29, 2010 4:06 PM Call placed to: Patient Reason for Call: Confirm/change Appt Summary of Call: Tried to reach patient,there was no answer. Initial call taken by: Domenic Polite, CNMT,  September 29, 2010 4:06 PM     Nuclear Med Background Indications for Stress Test: Evaluation for Ischemia, Surgical Clearance, Stent Patency  Indications Comments: Pre-Op clearance for D&C - Dr. Elana Alm  History: COPD, Heart Catheterization, Myocardial Perfusion Study, Stents  History Comments: Hypertrophic cardiomyopathy, '03 cath, '06 MPI trivial isch./ EF = 63%, '08 Stents- OM1/LAD  Symptoms: Palpitations    Nuclear Pre-Procedure Cardiac Risk Factors: CVA, Family History - CAD, Hypertension, Lipids Height (in): 64

## 2010-12-30 NOTE — Assessment & Plan Note (Signed)
Summary: E[facility]/. [facility]  Medications Added NORETHINDRONE ACETATE 5 MG TABS (NORETHINDRONE ACETATE) take 2 tablets daily METOPROLOL SUCCINATE 25 MG XR24H-TAB (METOPROLOL SUCCINATE) take one two times a day METOPROLOL SUCCINATE 50 MG XR24H-TAB (METOPROLOL SUCCINATE) Take one tablet by mouth twice a day NOVOLIN 70/30 70-30 % SUSP (INSULIN ISOPHANE & REGULAR) take as directed        Visit Type:  Follow-up Primary Provider:  Dr. Adela Glimpse.  CC:  irregular heart beat and edema in feet.  History of Present Illness: This is a 77 year old white female patient who was recently hospitalized with pneumonia and acute on chronic diastolic heart failure June 19, 2010. She diuresed easily and was sent home the next day. She was then readmitted July 04, 2010 with ventricular bigeminy and palpitations. She had mild volume depletion which was corrected. Her heart failure was compensated.  The patient returns for followup today.She continues to have daily palpitations.She says her heart races and then slows down to normal. She does not become dizzy or presyncopal with this. She is scheduled to have a monitor placed on her today. Her breathing has been normal and she has had no recurrent swelling. She is watching her salt closely but her blood pressure remains elevated.  Current Medications (verified): 1)  Aspirin 81 Mg  Tabs (Aspirin) .Marland Kitchen.. 1 By Mouth Daily 2)  Amlodipine Besylate 10 Mg Tabs (Amlodipine Besylate) .Marland Kitchen.. 1 By Mouth Dialy 3)  Isosorbide Mononitrate Cr 60 Mg Xr24h-Tab (Isosorbide Mononitrate) .Marland Kitchen.. 1 By Mouth Daily 4)  Furosemide 20 Mg Tabs (Furosemide) .... One Daily 5)  Norethindrone Acetate 5 Mg Tabs (Norethindrone Acetate) .... Take 2 Tablets Daily 6)  Metoprolol Succinate 25 Mg Xr24h-Tab (Metoprolol Succinate) .... Take One Two Times A Day 7)  Novolin 70/30 70-30 % Susp (Insulin Isophane & Regular) .... Take As Directed  Allergies: 1)  ! Darvocet  Past History:  Past Medical History: Last  updated: 12/05/2008 Current Problems:  CAD, NATIVE VESSEL (ICD-414.01) CVA (ICD-434.91) HYPERTENSION, BENIGN (ICD-401.1) HYPERLIPIDEMIA-MIXED (ICD-272.4)  Past Surgical History: Last updated: 12/05/2008 Hysterectomy Appendecotmy R Knee Surgery  Review of Systems       see history of present illness  Vital Signs:  Patient profile:   77 year old female Height:      64 inches Weight:      163 pounds Pulse rate:   84 / minute Pulse rhythm:   regular BP sitting:   162 / 66  (left arm)  Vitals Entered By: Jacquelin Hawking, CMA (July 20, 2010 10:42 AM)  Physical Exam  General:   Well-nournished, in no acute distress. Neck:Bilateral carotid bruits, No JVD, HJR,  or thyroid enlargement Lungs: No tachypnea, clear without wheezing, rales, or rhonchi Cardiovascular: RRR with some skipping, PMI not displaced, positive S4 and 2/6 systolic murmur at the left sternal border, bruit, thrill, or heave. Abdomen: BS normal. Soft without organomegaly, masses, lesions or tenderness. Extremities:venous stasis changes on the right ankle, without cyanosis, clubbing or edema. Good distal pulses bilateral SKin: Warm, no lesions or rashes  Musculoskeletal: No deformities Neuro: no focal signs    EKG  Procedure date:  07/20/2010  Findings:      normal sinus rhythm with nonspecific ST-T wave changes no acute change  Impression & Recommendations:  Problem # 1:  DIASTOLIC HEART FAILURE, CHRONIC (ICD-428.32) Patient had a recent admission with pneumonia and acute on chronic diastolic heart failure. She is compensated today. She should continue to follow low sodium diet. Her blood pressure is elevated today.  I will increase her metoprolol.Patient had 2-D echo March 26, 2010. Her ejection fraction was 60-65%. She had grade 2 diastolic dysfunction. The following medications were removed from the medication list:    Carvedilol 12.5 Mg Tabs (Carvedilol) ..... One twice a day    Plavix 75 Mg Tabs  (Clopidogrel bisulfate) .Marland Kitchen... 1 by mouth daily    Benazepril Hcl 40 Mg Tabs (Benazepril hcl) .Marland Kitchen... 1 by mouth two times a day Her updated medication list for this problem includes:    Aspirin 81 Mg Tabs (Aspirin) .Marland Kitchen... 1 by mouth daily    Amlodipine Besylate 10 Mg Tabs (Amlodipine besylate) .Marland Kitchen... 1 by mouth dialy    Isosorbide Mononitrate Cr 60 Mg Xr24h-tab (Isosorbide mononitrate) .Marland Kitchen... 1 by mouth daily    Furosemide 20 Mg Tabs (Furosemide) ..... One daily    Metoprolol Succinate 25 Mg Xr24h-tab (Metoprolol succinate) .Marland Kitchen... Take one two times a day  Problem # 2:  HYPERTENSION, BENIGN (ICD-401.1)  Patient's blood pressure is elevated. I will increase her metoprolol. The following medications were removed from the medication list:    Carvedilol 12.5 Mg Tabs (Carvedilol) ..... One twice a day    Benazepril Hcl 40 Mg Tabs (Benazepril hcl) .Marland Kitchen... 1 by mouth two times a day Her updated medication list for this problem includes:    Aspirin 81 Mg Tabs (Aspirin) .Marland Kitchen... 1 by mouth daily    Amlodipine Besylate 10 Mg Tabs (Amlodipine besylate) .Marland Kitchen... 1 by mouth dialy    Furosemide 20 Mg Tabs (Furosemide) ..... One daily    Metoprolol Succinate 50 Mg Xr24h-tab (Metoprolol succinate) .Marland Kitchen... Take one tablet by mouth twice a day  The following medications were removed from the medication list:    Carvedilol 12.5 Mg Tabs (Carvedilol) ..... One twice a day    Benazepril Hcl 40 Mg Tabs (Benazepril hcl) .Marland Kitchen... 1 by mouth two times a day Her updated medication list for this problem includes:    Aspirin 81 Mg Tabs (Aspirin) .Marland Kitchen... 1 by mouth daily    Amlodipine Besylate 10 Mg Tabs (Amlodipine besylate) .Marland Kitchen... 1 by mouth dialy    Furosemide 20 Mg Tabs (Furosemide) ..... One daily    Metoprolol Succinate 50 Mg Xr24h-tab (Metoprolol succinate) .Marland Kitchen... Take one tablet by mouth twice a day  Problem # 3:  PALPITATIONS (ICD-785.1)  Patient continues to have palpitations. She had ventricular bigeminy in the hospital. She  is scheduled for monitor to be placed on her today. We will also check a BMET. The following medications were removed from the medication list:    Carvedilol 12.5 Mg Tabs (Carvedilol) ..... One twice a day    Plavix 75 Mg Tabs (Clopidogrel bisulfate) .Marland Kitchen... 1 by mouth daily    Benazepril Hcl 40 Mg Tabs (Benazepril hcl) .Marland Kitchen... 1 by mouth two times a day Her updated medication list for this problem includes:    Aspirin 81 Mg Tabs (Aspirin) .Marland Kitchen... 1 by mouth daily    Amlodipine Besylate 10 Mg Tabs (Amlodipine besylate) .Marland Kitchen... 1 by mouth dialy    Isosorbide Mononitrate Cr 60 Mg Xr24h-tab (Isosorbide mononitrate) .Marland Kitchen... 1 by mouth daily    Metoprolol Succinate 50 Mg Xr24h-tab (Metoprolol succinate) .Marland Kitchen... Take one tablet by mouth twice a day  The following medications were removed from the medication list:    Carvedilol 12.5 Mg Tabs (Carvedilol) ..... One twice a day    Plavix 75 Mg Tabs (Clopidogrel bisulfate) .Marland Kitchen... 1 by mouth daily    Benazepril Hcl 40 Mg Tabs (Benazepril hcl) .Marland KitchenMarland KitchenMarland KitchenMarland Kitchen  1 by mouth two times a day Her updated medication list for this problem includes:    Aspirin 81 Mg Tabs (Aspirin) .Marland Kitchen... 1 by mouth daily    Amlodipine Besylate 10 Mg Tabs (Amlodipine besylate) .Marland Kitchen... 1 by mouth dialy    Isosorbide Mononitrate Cr 60 Mg Xr24h-tab (Isosorbide mononitrate) .Marland Kitchen... 1 by mouth daily    Metoprolol Succinate 50 Mg Xr24h-tab (Metoprolol succinate) .Marland Kitchen... Take one tablet by mouth twice a day  Orders: EKG w/ Interpretation (93000)  Problem # 4:  CAROTID BRUIT (ICD-785.9) Patient has bilateral carotid bruits. She had Dopplers back in April that were okay.  Problem # 5:  CAD, NATIVE VESSEL (ICD-414.01) Patient had stenting of the mid LAD and OM in October 2008. Patient denies chest pain. The following medications were removed from the medication list:    Carvedilol 12.5 Mg Tabs (Carvedilol) ..... One twice a day    Plavix 75 Mg Tabs (Clopidogrel bisulfate) .Marland Kitchen... 1 by mouth daily    Benazepril Hcl  40 Mg Tabs (Benazepril hcl) .Marland Kitchen... 1 by mouth two times a day Her updated medication list for this problem includes:    Aspirin 81 Mg Tabs (Aspirin) .Marland Kitchen... 1 by mouth daily    Amlodipine Besylate 10 Mg Tabs (Amlodipine besylate) .Marland Kitchen... 1 by mouth dialy    Isosorbide Mononitrate Cr 60 Mg Xr24h-tab (Isosorbide mononitrate) .Marland Kitchen... 1 by mouth daily    Metoprolol Succinate 50 Mg Xr24h-tab (Metoprolol succinate) .Marland Kitchen... Take one tablet by mouth twice a day  Orders: TLB-BMP (Basic Metabolic Panel-BMET) (80048-METABOL) EKG w/ Interpretation (93000)  Patient Instructions: 1)  Your physician recommends that you schedule a follow-up appointment in:  2 months with Dr. Antoine Poche 2)  Your physician recommends that you  have lab work today: BMET 3)  Your physician has recommended you make the following change in your medication: INCREASE Metoprolol Prescriptions: METOPROLOL SUCCINATE 50 MG XR24H-TAB (METOPROLOL SUCCINATE) Take one tablet by mouth twice a day  #30 x 6   Entered by:   Lisabeth Devoid RN   Authorized by:   Marletta Lor, PA-C   Signed by:   Lisabeth Devoid RN on 07/20/2010   Method used:   Print then Give to Patient   RxID:   [id number]

## 2010-12-30 NOTE — Miscellaneous (Signed)
  Clinical Lists Changes  Observations: Added new observation of NUCLEAR NOS: Exercise Capacity: Lexiscan with no exercise. BP Response: Normal blood pressure response. Clinical Symptoms: There is chest pain ECG Impression: No significant ST segment change suggestive of ischemia. Overall Impression: Abnormal lexisan nuclear study with mild inferior ischemia.   (10/07/2010 14:12)      Nuclear Study  Procedure date:  10/07/2010  Findings:      Exercise Capacity: Lexiscan with no exercise. BP Response: Normal blood pressure response. Clinical Symptoms: There is chest pain ECG Impression: No significant ST segment change suggestive of ischemia. Overall Impression: Abnormal lexisan nuclear study with mild inferior ischemia.

## 2010-12-30 NOTE — Assessment & Plan Note (Signed)
Summary: Cardiology Nuclear Testing  Nuclear Med Background Indications for Stress Test: Evaluation for Ischemia, Surgical Clearance, Stent Patency  Indications Comments: Pre-Op clearance for D&C - Dr. Elana Alm  History: Asthma, COPD, Heart Catheterization, Myocardial Infarction, Myocardial Perfusion Study, Stents  History Comments: Hypertrophic cardiomyopathy.  '03 cath.  '06 MPI trivial isch./ EF = 63%.  '08 Stents- OM1/LAD  Symptoms: DOE, Palpitations, Rapid HR    Nuclear Pre-Procedure Cardiac Risk Factors: CVA, Family History - CAD, Hypertension, IDDM Type 2, Lipids Caffeine/Decaff Intake: None NPO After: 9:30 PM Lungs: Clear IV 0.9% NS with Angio Cath: 20g     IV Site: R Antecubital IV Started by: Stanton Kidney, EMT-P Chest Size (in) 38     Cup Size D     Height (in): 64 Weight (lb): 164 BMI: 28.25 Tech Comments: CBG= 165 @ 11:15 am this day. Stanton Kidney, EMT-P  October 07, 2010 11:15 AM   Nuclear Med Study 1 or 2 day study:  1 day     Stress Test Type:  Eugenie Birks Reading MD:  Olga Millers, MD     Referring MD:  Shela Commons. Hochrein Resting Radionuclide:  Technetium 81m Tetrofosmin     Resting Radionuclide Dose:  33 mCi  Stress Radionuclide:  Technetium 72m Tetrofosmin     Stress Radionuclide Dose:  33 mCi   Stress Protocol      Max HR:  105 bpm     Predicted Max HR:  144 bpm  Max Systolic BP: 168 mm Hg     Percent Max HR:  72.92 %Rate Pressure Product:  62130  Lexiscan: 0.4 mg   Stress Test Technologist:  Irean Hong,  RN     Nuclear Technologist:  Domenic Polite, CNMT  Rest Procedure  Myocardial perfusion imaging was performed at rest 45 minutes following the intravenous administration of Technetium 46m Tetrofosmin.  Stress Procedure  The patient received IV Lexiscan 0.4 mg over 15-seconds.  Technetium 88m Tetrofosmin injected at 30-seconds.  There were nonspecific changes with lexiscan, rare PJC.  Quantitative spect images were obtained after a 45 minute  delay.  QPS Raw Data Images:  Acquisition technically good; normal left ventricular size. Stress Images:  There is decreased uptake in the inferior wall Rest Images:  Normal homogeneous uptake in all areas of the myocardium. Subtraction (SDS):  These findings are consistent with mild inferior ischemia. Transient Ischemic Dilatation:  1.02  (Normal <1.22)  Lung/Heart Ratio:  0.29  (Normal <0.45)  Quantitative Gated Spect Images QGS EDV:  121 ml QGS ESV:  41 ml QGS EF:  66 % QGS cine images:  Normal wall motion.   Overall Impression  Exercise Capacity: Lexiscan with no exercise. BP Response: Normal blood pressure response. Clinical Symptoms: There is chest pain ECG Impression: No significant ST segment change suggestive of ischemia. Overall Impression: Abnormal lexisan nuclear study with mild inferior ischemia.  Appended Document: Nuclear  NA 5:45 pm 11/15  NA 11/17 Low risk scan. The patient is at acceptable risk for the planned surgery.  She should continue with secondary risk reduction.  Appended Document: Cardiology Nuclear Testing pt aware of results and OK to have surgery

## 2010-12-30 NOTE — Letter (Signed)
Summary: [facility]   Imported By: Roderic Ovens 07/30/2010 16:04:19  _____________________________________________________________________  External Attachment:    Type:   Image     Comment:   External Document

## 2010-12-30 NOTE — Progress Notes (Signed)
   OV,12 lead faxed to [facility] @ [phone] [facility]  September 16, 2010 1:32 PM

## 2010-12-31 NOTE — Op Note (Signed)
NAMESHADAY, RAYBORN               ACCOUNT NO.:  [id number]  MEDICAL RECORD NO.:  [id number]          PATIENT TYPE:  AMB  LOCATION:  SDC                           FACILITY:  [facility]  PHYSICIAN:  Miguel Aschoff, M.D.       DATE OF BIRTH:  1932-09-08  DATE OF PROCEDURE:  12/10/2010 DATE OF DISCHARGE:  12/10/2010                              OPERATIVE REPORT   PREOPERATIVE DIAGNOSIS:  Postmenopausal bleeding.  POSTOPERATIVE DIAGNOSIS:  Postmenopausal bleeding.  PROCEDURES:  Cervical dilatation, hysteroscopy, uterine curettage.  SURGEON:  Miguel Aschoff, MD  ANESTHESIA:  IV sedation with paracervical block.  COMPLICATIONS:  None.  JUSTIFICATION:  The patient is a 77 year old white female initially evaluated in August 2011 for postmenopausal bleeding.  The patient underwent an endometrial biopsy at that time and the pathology report revealed scanty fragments of benign endometrium with crowded glands and breakdown.  I was concern whether this represented a polyp or hyperplasia.  Because of this bleeding in this pathology report on endometrial biopsy, it was felt that a more thorough examination of the uterine cavity had to be obtained and in view of the patient's age and medical problems, the patient ultimately underwent cardiac evaluation and medical clearance and presents now to undergo D and C and hysteroscopy.  The risks and benefits of procedure were discussed with the patient.  Informed consent has been obtained.  PROCEDURE IN DETAILS:  The patient was taken to the operating, placed in the supine position.  IV sedation was administered without difficulty. She was then placed in the dorsal lithotomy position, prepped and draped in the usual sterile fashion.  Examination under anesthesia revealed normal external genitalia.  The patient has a large rectocele, grade 3, grade 2 cystocele and grade 2-3 uterine descensus.  Vaginal vault showed atrophic changes.  The cervix was without gross  lesion.  The uterus appeared to be top normal size but was smooth and regular and mobile. No adnexal masses were felt.  At this point, a speculum was placed in the vaginal vault.  Anterior cervical lip was grasped with tenaculum and then the cervix was injected with a total of 10 mL of 1% Xylocaine by placing equal amounts at the 12, 4, and 8 o'clock positions.  After this was done, the cervix was sounded to 10 cm and once this was done then the cervix was dilated using serial Pratt dilators until a #25 Pratt dilator could be passed.  Once this was done, the diagnostic hysteroscope was advanced through the endocervical canal.  No endocervical lesions were noted on entering the endometrial cavity.  No polyps or submucous myomas were noted.  There were no gross lesions noted in the endometrial cavity.  The only remarkable finding were multiple petechia long the cervix which appeared to be more consistent with atrophy.  At this point, the hysteroscope was removed and sharp vigorous curettage was carried out with only scant amount of tissue being obtained.  Again consistent with the hysteroscopic findings.  The tissue obtained was sent for pathologic evaluation.  At this point once the curettage had been completed, the instruments were removed. Hemostasis  was readily achieved.  The patient reversed to the anesthetic and brought to recovery room in satisfactory condition.  Blood loss was minimal.  Plan is for the patient to be discharged home.  Medications for home include Tylenol No. 3 one or two every 4-6 h. if needed for pain.  She is to continue all her preoperative medications.  She is to call on January 18 for pathology report.  She will be seen back in 4 weeks for followup examination.     Miguel Aschoff, M.D.     AR/MEDQ  D:  12/10/2010  T:  12/11/2010  Job:  [id number]  Electronically Signed by Miguel Aschoff M.D. on 12/31/2010 06:44:29 AM

## 2011-02-09 LAB — CBC
HCT: 32.4 % — ABNORMAL LOW (ref 36.0–46.0)
Hemoglobin: 11 g/dL — ABNORMAL LOW (ref 12.0–15.0)
MCH: 30.3 pg (ref 26.0–34.0)
MCHC: 34 g/dL (ref 30.0–36.0)
MCV: 89.2 fL (ref 78.0–100.0)
Platelets: 212 10*3/uL (ref 150–400)
RBC: 3.64 MIL/uL — ABNORMAL LOW (ref 3.87–5.11)
RDW: 13.7 % (ref 11.5–15.5)
WBC: 4.3 10*3/uL (ref 4.0–10.5)

## 2011-02-09 LAB — APTT: aPTT: 28 seconds (ref 24–37)

## 2011-02-09 LAB — BASIC METABOLIC PANEL
BUN: 36 mg/dL — ABNORMAL HIGH (ref 6–23)
CO2: 26 mEq/L (ref 19–32)
Calcium: 10 mg/dL (ref 8.4–10.5)
Chloride: 106 mEq/L (ref 96–112)
Creatinine, Ser: 1.86 mg/dL — ABNORMAL HIGH (ref 0.4–1.2)
GFR calc Af Amer: 32 mL/min — ABNORMAL LOW (ref >60)
GFR calc non Af Amer: 26 mL/min — ABNORMAL LOW (ref >60)
Glucose, Bld: 134 mg/dL — ABNORMAL HIGH (ref 70–99)
Potassium: 4 mEq/L (ref 3.5–5.1)
Sodium: 138 mEq/L (ref 135–145)

## 2011-02-09 LAB — PROTIME-INR
INR: 0.93 (ref 0.00–1.49)
Prothrombin Time: 12.7 seconds (ref 11.6–15.2)

## 2011-02-10 LAB — CBC
HCT: 28.8 % — ABNORMAL LOW (ref 36.0–46.0)
Hemoglobin: 9.8 g/dL — ABNORMAL LOW (ref 12.0–15.0)
MCH: 31.2 pg (ref 26.0–34.0)
MCHC: 34.1 g/dL (ref 30.0–36.0)
MCV: 91.6 fL (ref 78.0–100.0)
Platelets: 176 10*3/uL (ref 150–400)
RBC: 3.15 MIL/uL — ABNORMAL LOW (ref 3.87–5.11)
RDW: 14.1 % (ref 11.5–15.5)
WBC: 3.4 10*3/uL — ABNORMAL LOW (ref 4.0–10.5)

## 2011-02-10 LAB — BASIC METABOLIC PANEL
BUN: 26 mg/dL — ABNORMAL HIGH (ref 6–23)
CO2: 23 mEq/L (ref 19–32)
Calcium: 9.6 mg/dL (ref 8.4–10.5)
Chloride: 111 mEq/L (ref 96–112)
Creatinine, Ser: 1.7 mg/dL — ABNORMAL HIGH (ref 0.4–1.2)
GFR calc Af Amer: 35 mL/min — ABNORMAL LOW (ref >60)
GFR calc non Af Amer: 29 mL/min — ABNORMAL LOW (ref >60)
Glucose, Bld: 170 mg/dL — ABNORMAL HIGH (ref 70–99)
Potassium: 4.5 mEq/L (ref 3.5–5.1)
Sodium: 139 mEq/L (ref 135–145)

## 2011-02-10 LAB — SAMPLE TO BLOOD BANK

## 2011-02-10 LAB — DIFFERENTIAL
Basophils Absolute: 0 10*3/uL (ref 0.0–0.1)
Basophils Relative: 1 % (ref 0–1)
Eosinophils Absolute: 0.1 10*3/uL (ref 0.0–0.7)
Eosinophils Relative: 2 % (ref 0–5)
Lymphocytes Relative: 24 % (ref 12–46)
Lymphs Abs: 0.8 10*3/uL (ref 0.7–4.0)
Monocytes Absolute: 0.3 10*3/uL (ref 0.1–1.0)
Monocytes Relative: 9 % (ref 3–12)
Neutro Abs: 2.2 10*3/uL (ref 1.7–7.7)
Neutrophils Relative %: 64 % (ref 43–77)

## 2011-02-11 LAB — RENAL FUNCTION PANEL
Albumin: 3.6 g/dL (ref 3.5–5.2)
BUN: 46 mg/dL — ABNORMAL HIGH (ref 6–23)
Creatinine, Ser: 2.06 mg/dL — ABNORMAL HIGH (ref 0.4–1.2)
Phosphorus: 4.4 mg/dL (ref 2.3–4.6)

## 2011-02-11 LAB — TROPONIN I
Troponin I: 0.01 ng/mL (ref 0.00–0.06)
Troponin I: 0.02 ng/mL (ref 0.00–0.06)

## 2011-02-11 LAB — CBC
HCT: 32.8 % — ABNORMAL LOW (ref 36.0–46.0)
Hemoglobin: 10.6 g/dL — ABNORMAL LOW (ref 12.0–15.0)
MCH: 29.9 pg (ref 26.0–34.0)
MCHC: 32.3 g/dL (ref 30.0–36.0)
MCV: 92.7 fL (ref 78.0–100.0)
Platelets: 187 10*3/uL (ref 150–400)
RBC: 3.54 MIL/uL — ABNORMAL LOW (ref 3.87–5.11)
RDW: 13.1 % (ref 11.5–15.5)
WBC: 3.9 10*3/uL — ABNORMAL LOW (ref 4.0–10.5)

## 2011-02-11 LAB — BASIC METABOLIC PANEL
BUN: 54 mg/dL — ABNORMAL HIGH (ref 6–23)
CO2: 21 mEq/L (ref 19–32)
Calcium: 9.4 mg/dL (ref 8.4–10.5)
Chloride: 108 mEq/L (ref 96–112)
Creatinine, Ser: 2.58 mg/dL — ABNORMAL HIGH (ref 0.4–1.2)
GFR calc Af Amer: 22 mL/min — ABNORMAL LOW (ref >60)
GFR calc non Af Amer: 18 mL/min — ABNORMAL LOW (ref >60)
Glucose, Bld: 113 mg/dL — ABNORMAL HIGH (ref 70–99)
Potassium: 5.6 mEq/L — ABNORMAL HIGH (ref 3.5–5.1)
Sodium: 136 mEq/L (ref 135–145)

## 2011-02-11 LAB — CK TOTAL AND CKMB (NOT AT ARMC)
CK, MB: 1.8 ng/mL (ref 0.3–4.0)
CK, MB: 2.1 ng/mL (ref 0.3–4.0)
Relative Index: INVALID (ref 0.0–2.5)
Total CK: 38 U/L (ref 7–177)
Total CK: 41 U/L (ref 7–177)

## 2011-02-11 LAB — DIFFERENTIAL
Basophils Absolute: 0 10*3/uL (ref 0.0–0.1)
Basophils Relative: 0 % (ref 0–1)
Eosinophils Relative: 1 % (ref 0–5)
Monocytes Absolute: 0.4 10*3/uL (ref 0.1–1.0)
Neutro Abs: 2.5 10*3/uL (ref 1.7–7.7)

## 2011-02-11 LAB — GLUCOSE, CAPILLARY
Glucose-Capillary: 157 mg/dL — ABNORMAL HIGH (ref 70–99)
Glucose-Capillary: 159 mg/dL — ABNORMAL HIGH (ref 70–99)

## 2011-02-11 LAB — TSH: TSH: 2.536 u[IU]/mL (ref 0.350–4.500)

## 2011-02-12 LAB — POCT I-STAT, CHEM 8
BUN: 25 mg/dL — ABNORMAL HIGH (ref 6–23)
Creatinine, Ser: 1.9 mg/dL — ABNORMAL HIGH (ref 0.4–1.2)
Hemoglobin: 13.3 g/dL (ref 12.0–15.0)
Potassium: 5.9 mEq/L — ABNORMAL HIGH (ref 3.5–5.1)
Sodium: 137 mEq/L (ref 135–145)

## 2011-02-12 LAB — COMPREHENSIVE METABOLIC PANEL
ALT: 9 U/L (ref 0–35)
AST: 10 U/L (ref 0–37)
Alkaline Phosphatase: 44 U/L (ref 39–117)
CO2: 28 mEq/L (ref 19–32)
GFR calc Af Amer: 27 mL/min — ABNORMAL LOW (ref >60)
GFR calc non Af Amer: 22 mL/min — ABNORMAL LOW (ref >60)
Glucose, Bld: 79 mg/dL (ref 70–99)
Potassium: 4.2 mEq/L (ref 3.5–5.1)
Sodium: 134 mEq/L — ABNORMAL LOW (ref 135–145)
Total Bilirubin: 0.8 mg/dL (ref 0.3–1.2)
Total Protein: 6.6 g/dL (ref 6.0–8.3)

## 2011-02-12 LAB — CARDIAC PANEL(CRET KIN+CKTOT+MB+TROPI)
CK, MB: 1.9 ng/mL (ref 0.3–4.0)
CK, MB: 2 ng/mL (ref 0.3–4.0)
Relative Index: INVALID (ref 0.0–2.5)
Total CK: 42 U/L (ref 7–177)
Troponin I: 0.22 ng/mL — ABNORMAL HIGH (ref 0.00–0.06)

## 2011-02-12 LAB — DIFFERENTIAL
Eosinophils Absolute: 0 10*3/uL (ref 0.0–0.7)
Lymphocytes Relative: 7 % — ABNORMAL LOW (ref 12–46)
Lymphs Abs: 0.7 10*3/uL (ref 0.7–4.0)
Neutrophils Relative %: 87 % — ABNORMAL HIGH (ref 43–77)

## 2011-02-12 LAB — LIPID PANEL
HDL: 46 mg/dL (ref >39)
LDL Cholesterol: 180 mg/dL — ABNORMAL HIGH (ref 0–99)
Total CHOL/HDL Ratio: 5.9 RATIO
Triglycerides: 219 mg/dL — ABNORMAL HIGH (ref <150)
VLDL: 44 mg/dL — ABNORMAL HIGH (ref 0–40)

## 2011-02-12 LAB — GLUCOSE, CAPILLARY
Glucose-Capillary: 114 mg/dL — ABNORMAL HIGH (ref 70–99)
Glucose-Capillary: 123 mg/dL — ABNORMAL HIGH (ref 70–99)
Glucose-Capillary: 124 mg/dL — ABNORMAL HIGH (ref 70–99)

## 2011-02-12 LAB — CK TOTAL AND CKMB (NOT AT ARMC)
CK, MB: 1.8 ng/mL (ref 0.3–4.0)
Total CK: 35 U/L (ref 7–177)

## 2011-02-12 LAB — POCT CARDIAC MARKERS
CKMB, poc: 1.2 ng/mL (ref 1.0–8.0)
Myoglobin, poc: 90.6 ng/mL (ref 12–200)

## 2011-02-12 LAB — BRAIN NATRIURETIC PEPTIDE: Pro B Natriuretic peptide (BNP): 851 pg/mL — ABNORMAL HIGH (ref 0.0–100.0)

## 2011-02-12 LAB — CBC
HCT: 32.5 % — ABNORMAL LOW (ref 36.0–46.0)
Hemoglobin: 11.4 g/dL — ABNORMAL LOW (ref 12.0–15.0)
Platelets: 177 10*3/uL (ref 150–400)
RBC: 3.81 MIL/uL — ABNORMAL LOW (ref 3.87–5.11)
WBC: 3.3 10*3/uL — ABNORMAL LOW (ref 4.0–10.5)
WBC: 9.6 10*3/uL (ref 4.0–10.5)

## 2011-02-12 LAB — CREATININE, URINE, RANDOM: Creatinine, Urine: 40.3 mg/dL

## 2011-02-12 LAB — TSH: TSH: 5.073 u[IU]/mL — ABNORMAL HIGH (ref 0.350–4.500)

## 2011-03-30 ENCOUNTER — Encounter: Payer: Self-pay | Admitting: Physician Assistant

## 2011-03-30 DIAGNOSIS — N184 Chronic kidney disease, stage 4 (severe): Secondary | ICD-10-CM | POA: Insufficient documentation

## 2011-03-30 DIAGNOSIS — E119 Type 2 diabetes mellitus without complications: Secondary | ICD-10-CM

## 2011-03-30 DIAGNOSIS — N189 Chronic kidney disease, unspecified: Secondary | ICD-10-CM

## 2011-04-12 NOTE — Assessment & Plan Note (Signed)
[facility]                            CARDIOLOGY OFFICE NOTE   [name], [name]                      MRN:          [id number]  DATE:09/09/2009                            DOB:          12-Oct-1932    PRIMARY DOCTOR:  Shelby Roan, MD   REASON FOR PRESENTATION:  Evaluate the patient with coronary disease and  palpitations.   HISTORY OF PRESENT ILLNESS:  The patient is 77 years old.  She presents  for 78-month followup, but reports that she has been having palpitations  over the last 2-3 weeks.  She describes episodes where her heart beats  very quickly.  It may last for up to 48 hours.  She said she has never  had this before.  It happens at rest.  She does not describe  irregularity.  She does not describe chest pressure or neck or arm  discomfort.  She does not have any presyncope or syncope.  It does make  her feel uncomfortable.  She has not taken her heart rate or blood  pressure during these episodes.  She has some mild lower extremity  swelling, but denies any weight gain.  She reports that her primary  discussed with her some abnormality of her thyroid, though I do not see  mention of this.  Her last thyroid study done in May was normal.   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   ALLERGIES:  Intolerances none.   MEDICATIONS:  1. Simvastatin 20 mg daily.  2. Humulin.  3. Aspirin 81 mg daily.  4. Plavix 75 mg daily.  5. Benazepril 40 mg b.i.d.  6. Imdur 60 mg daily.  7. Norvasc 10 mg daily.  8. Hydrochlorothiazide 25 mg daily.  9. Novolin.  10.Multivitamin.   REVIEW OF SYSTEMS:  As stated in the HPI, and otherwise negative for all  other systems.   PHYSICAL EXAMINATION:  GENERAL:  The patient is in  no distress.  VITAL SIGNS:  Blood pressure 158/66, heart rate 68 and regular, weight  173 pounds, body mass index 28.  HEENT:  Eyes are unremarkable.  Pupils are equal, round, and reactive to  light.  Fundi not visualized.  Oral mucosa unremarkable. NECK:  No  jugular venous distention at 45 degrees.  Carotid upstroke brisk and  symmetrical.  No bruits, no thyromegaly.  LYMPHATICS:  No cervical,  axillary, or inguinal adenopathy.  LUNGS:  Clear to auscultation bilaterally.  BACK:  No costovertebral angle tenderness.  CHEST:  Unremarkable.  HEART:  PMI not displaced or sustained.  S1 and S2 within normal limits.  No S3, no S4, no clicks, no rubs, no murmurs.  ABDOMEN:  Flat.  Positive  bowel sounds.  Normal in frequency and pitch.  No bruits, no rebound, no  guarding, no midline pulsatile mass, no hepatomegaly, no splenomegaly.  SKIN:  No rashes, no nodules.  EXTREMITIES:  2+ pulses, mild bilateral  lower extremity edema with  varicose veins on the right lower extremity and chronic venous stasis  changes.  NEURO:  Oriented to birth, place, and time.  Cranial nerves II through  XII grossly intact.  Motor grossly intact.   EKG; sinus rhythm, rate 68, right bundle-branch block, left anterior  fascicular block, right bundle-branch block is new since previous EKG.   ASSESSMENT AND PLAN:  1. The patient has palpitations as described.  She is no longer on a      beta-blocker.  I look through my notes and through her primary      office notes and could not see a reason that this was discontinued.      I am going to restart 6.25 mg twice a day of carvedilol.  If she      has continued tachy palpitations, I will increase the dose most      likely and also check an event or Holter monitor.  If she has any      severe symptoms, she is to come and get an EKG while they are      happening since they seem to persist for quite awhile.  2. Coronary disease.  We had a discussion about need to  continue      Plavix as she would like to come off this, but she has no      contraindications.  I would suggest staying on it.  3. Diabetes, per Dr. Elana [name].  4. Hypertension.  Blood pressure is slightly elevated.  This will be      treated in the context of restarting the beta-blocker.  5. Followup.  I will see her back in about 2 months or sooner if      needed.     Shelby Rotunda, MD, [facility]  Electronically Signed    JH/MedQ  DD: 09/09/2009  DT: 09/10/2009  Job #: [id number]   cc:   [name], M.D.

## 2011-04-12 NOTE — Discharge Summary (Signed)
[name], [name]               ACCOUNT NO.:  [id number]   MEDICAL RECORD NO.:  [id number]          PATIENT TYPE:  INP   LOCATION:  2020                         FACILITY:  [facility]   PHYSICIAN:  Learta Codding, MD,FACC DATE OF BIRTH:  [birth year]   DATE OF ADMISSION:  09/27/2007  DATE OF DISCHARGE:  09/30/2007                               DISCHARGE SUMMARY   DISCHARGING PHYSICIAN:  Learta Codding, MD   PRIMARY CARDIOLOGIST:  Learta Codding, MD.   PRIMARY CARE:  Unclear at this time.   DISCHARGING DIAGNOSIS:  1. Coronary artery disease, non-ST elevated MI this admission.  Status      post cardiac catheterization with drug-eluting stent to the obtuse      marginal 1 and mid- left anterior descending.  2. Altered mental status after sedative and narcotics given this      admission, resolved at time of discharge  3. Mild bump in renal function this admission stable at time of      discharge.  Creatinine 1.43 after IV fluids  4. Diabetes.  5. Hypertension.  6. Dyslipidemia.  7. Previous light stroke.  8. Previous normal ejection fraction by catheterization.   PAST SURGICAL HISTORY INCLUDES:  1. Left knee surgery.  2. Appendectomy.  3. Bilateral cataract surgery.   HOSPITAL COURSE:  [name] is a very pleasant 77 year old female from  [place] who came to the emergency room at [facility]  complaining of burning in her chest.  There, the patient was found to  have a positive enzymes with ST depression on EKG.  The patient was  transferred to [facility] for further evaluation and catheterization by  Dr. Veverly Fells. Excell Seltzer on September 28, 2007.  The patient tolerated  procedure without complications.  However, the patient experienced some  decrease in mental status after sedative hypnotics given for  catheterization and pain.  Also mild elevation in creatinine noted 1.52.  The patient kept for further evaluation, sedatives, hypnotics, and  narcotics discontinued.   The  patient gently hydrated with IV fluids.  BUN and creatinine  stabilized at 26 and 1.43, potassium 4.5.  Hemoglobin stable at 9.7.  The patient mental status returned to baseline.  Patient stable to go  home after being seen by Dr. Lewayne Bunting on day of discharge.  At time of  discharge she has been given the post cardiac catheterization discharge  instructions.  Our office in [place] will call the patient on Monday for a  followup visit.   MEDICATIONS AT DISCHARGE INCLUDE:  1. Aspirin 325 daily.  2. Plavix 75 mg daily.  3. Benazepril 40 mg daily.  4. Insulin 70/30 as previously prescribed.  5. Benicar 40 mg.  6. Furosemide 40 mg daily.  7. Carvedilol 25 mg p.o. b.i.d.  The patient has been given      prescriptions for the Plavix and carvedilol and nitroglycerin.  8. She has been instructed to STOP her Toprol.   The patient understands to call our office for any problems from her  cath site, and to follow up within 2 weeks.  DURATION OF DISCHARGE ENCOUNTER:  Greater than 30 minutes.      Dorian Pod, ACNP      Learta Codding, MD,FACC  Electronically Signed    MB/MEDQ  D:  09/30/2007  T:  10/01/2007  Job:  [id number]

## 2011-04-12 NOTE — Cardiovascular Report (Signed)
[name], [name]               ACCOUNT NO.:  [id number]   MEDICAL RECORD NO.:  [id number]          PATIENT TYPE:  INP   LOCATION:  2020                         FACILITY:  [facility]   PHYSICIAN:  Veverly Fells. Excell Seltzer, MD  DATE OF BIRTH:  Jan 19, 1932   DATE OF PROCEDURE:  09/28/2007  DATE OF DISCHARGE:                            CARDIAC CATHETERIZATION   PROCEDURE:  PTCA and stenting of OM1, PTCA and stenting of the mid-LAD,  Star close of the right femoral artery.   [name] is a 77 year old diabetic woman, who presented with unstable  angina.  She underwent diagnostic catheterization by Dr. Gala Romney  yesterday.  This demonstrated severe stenosis of the first OM branch of  the left circumflex, as well as severe stenosis of the mid-LAD.  She was  referred for PCI.   Risks and indications of procedure were reviewed with the patient.  Informed consent was obtained.  The right groin was prepped, draped,  anesthetized with 1% lidocaine using the modified Seldinger technique.  A 6-French sheath was placed in the right femoral artery.  A 6-French XB  3-5 guide catheter was inserted.  Angiomax was used for anticoagulation.  The patient had been pre-loaded with clopidogrel.   Once therapeutic ACT was achieved, a Cougar guidewire was passed easily  beyond the area of severe stenosis in the OM branch.  There was a long  area of disease in that vessel.  A 2-0 x 20-mm Fire Star balloon was  inflated to 10 atmospheres on two inflations.  A 2.5 x 28-mm Cypher  stent was deployed at 14 atmospheres.  The stent was well expanded.  Following stenting, a 2.75 x 20-mm dura Star balloon was used to post  dilate.  The vessel was postdilated up to 16 atmospheres on two  inflations.  At the completion of the intervention, the stent was widely  expanded, and there was TIMI III flow in the vessel.  At that point, I  imaged the mid-LAD.  There is an area of moderate disease involving the  first diagonal  bifurcation, and beyond that area there was a focal  severe stenosis of 90%.  I elected to treat the severe stenosis in the  mid-LAD, as I thought that was clearly the most critical lesion.  The  same Cougar guidewire was passed into the distal LAD.  The lesion was  pre-dilated with the same 2-0 20-mm Fire Star balloon, up to 10  atmospheres.  A 2-5 x 18-mm Cypher stent was deployed at 16 atmospheres  and was well expanded.  The stent was post-dilated with a 2.75 x 15-mm  Dura Star balloon, up to 16 atmospheres on two inflations.  Following  post-dilatation, angiography demonstrated a well-expanded stent.  The  stent was somewhat oversized for the vessel.  There is a step-up and  step-down off the proximal and distal ends of the stent.  There was  moderate residual stenosis just proximal to the stent, but I elected not  to treat that area, as it has a potential of compromising a large  diagonal branch.  If [name] has  recurrent symptoms, that certainly  could be treated, but I felt it was best to leave alone at this time.  At the completion of the procedure, a Star close device was used to seal  the femoral arteriotomy.  The patient tolerated the procedure well and  had no immediate complications.   ASSESSMENT:  Successful two-vessel PCI of the first OM branch and of the  first obtuse marginal and the mid-LAD.   RECOMMENDATIONS:  [name] should be on dual anti-platelet therapy  with aspirin and Plavix for 12 months.  She will be eligible for  discharge home tomorrow.      Veverly Fells. Excell Seltzer, MD  Electronically Signed     MDC/MEDQ  D:  09/28/2007  T:  09/30/2007  Job:  [id number]

## 2011-04-12 NOTE — Assessment & Plan Note (Signed)
[facility]ALTHC[facility]                            CARDIOLOGY OFFICE NOTE   NAME:Keeran, WILLISHA SLIGAR                      MRN:          [id number]  DATE:02/18/2009                            DOB:          1932-02-13    PRIMARY:  Katherine Roan, MD   REASON FOR PRESENTATION:  Evaluate the patient with coronary artery  disease and hypertension.   HISTORY OF PRESENT ILLNESS:  The patient is 77 years old.  She presents  for followup of the above.  Since I last saw her, she continues to have  problems with an unsteady gait and falling.  She has not seen any  neurologist about this.  She is not loosing consciousness.  She is not  having any palpitations or presyncope.  She is not having any chest  discomfort, neck, or arm discomfort.  She is having no new shortness of  breath, PND, or orthopnea.  She has had changes to her medical regimen  for management of her blood pressure.  She said she could not take four  medicines that were started by her previous primary care doctor.  She  had her meds changed by Dr. Elana Alm.  The last that she recalls is an  increase in Norvasc about 3 months ago.  She has no meds with her and we  had to send her home to get them, so that we could clarify what she is  on.  The list is reported below.   PAST MEDICAL HISTORY:  Coronary artery disease (see the April 30, 2008  note for details.  She had a PTCA and Cypher stent to the mid LAD),  hypertension, diabetes mellitus, cerebrovascular accident with residual  right lower extremity deficit, tuberculosis as a child, hysterectomy,  chronic renal insufficiency.   ALLERGIES:  None.   MEDICATIONS:  Simvastatin 20 mg daily, Humulin, aspirin 81 mg daily,  Plavix 75 mg daily, benazepril 40 mg b.i.d., Imdur 60 mg daily, Norvasc  10 mg daily.   REVIEW OF SYSTEMS:  As stated in the HPI and otherwise negative for all  other systems.   PHYSICAL EXAMINATION:  GENERAL:  The patient is in no  distress.  VITAL SIGNS:  Blood pressure 162/62, heart rate 83 and regular, weight  166 pounds, body mass index 27.  HEENT:  Eyes unremarkable.  Pupils equal, round, and reactive to light,  fundi not visualized, oral mucosa unremarkable.  NECK:  No jugular venous distention at 45 degrees, carotid upstroke  brisk and symmetric, no bruits, no thyromegaly.  LYMPHATICS:  No cervical, axillary, inguinal adenopathy.  LUNGS:  Clear to auscultation bilaterally.  BACK:  No costovertebral angle tenderness.  CHEST:  Unremarkable.  HEART:  PMI not displaced or sustained, S1 and S2 within normal limits,  no S3, no S4, no clicks, no rubs, no murmurs.  ABDOMEN:  Flat, positive bowel sounds normal in frequency and pitch, no  bruits, no rebound, no guarding in the midline, pulsatile mass.  No  hepatomegaly.  No splenomegaly.  SKIN:  No rashes, no nodules.  EXTREMITIES:  Pulses 2+ throughout, trace bilateral lower  extremity  edema, no cyanosis or clubbing.  NEURO:  Oriented to person, place, and time, cranial nerves II through  XII grossly intact, motor grossly intact.   EKG, sinus rhythm, rate 83, old anteroseptal infarct, nonspecific  lateral T-wave changes, no change from previous EKGs.   ASSESSMENT AND PLAN:  1. Hypertension.  Her blood pressure is elevated.  It took quite a      while to clarify her meds.  We did call the Pharmacy, but they      wanted all filled recently.  We finally sent her home and she      brought back her bottles and we have the list as above.  I am going      to add hydrochlorothiazide 12.5 mg to her regimen to try to bring      her to a target less than 140/90.  She will get a BMET in 2 weeks.      I did review her most recent electrolytes and these were fine and      should tolerate this change.  2. Coronary artery disease.  She is having no ongoing symptoms.  She      needs continued risk reduction.  3. Dyslipidemia.  I reviewed her labs from her primary care.  In  July      2009, her LDL was 106 with an HDL of 36.  I would suggest she stay      on the current medication to get a repeat since it has been quite a      while.  The goal should be an LDL less than 70 and HDL greater than      50 given her diabetes.  4. Her last hemoglobin A1c appears to be 6.2, which would be      acceptable.  This is per Dr. Elana Alm.  5. Chronic renal insufficiency, this is stable.  We will watch this as      we initiate the hydrochlorothiazide.  6. Followup.  I would like to see her back in about 6 months or sooner      if needed.     Rollene Rotunda, MD, [facility]  Electronically Signed    JH/MedQ  DD: 02/18/2009  DT: 02/19/2009  Job #: [id number]   cc:   S. Kyra Manges, MD

## 2011-04-12 NOTE — Assessment & Plan Note (Signed)
[facility]                            CARDIOLOGY OFFICE NOTE   NAME:[name]                      MRN:          [id number]  DATE:02/13/2008                            DOB:          02-Jul-1932    PRIMARY CARE PHYSICIAN:  [name].   REASON FOR PRESENTATION:  Evaluate a patient with chest pain and  coronary disease.   HISTORY OF PRESENT ILLNESS:  The patient was hospitalized in October  with unstable angina and a non-Q wave MI.  She had a cardiac  catheterization by Dr. Gala [name] demonstrating left main normal, the LAD  had a 30-40% proximal stenosis and 40% mid stenosis followed by 70-80%  mid stenosis and 50% stenosis in a second diagonal, the circumflex had  70% stenosis in an OM1 followed by 80-90% stenosis in the midportion, a  small OM2 had an 80% lesion.  The right coronary artery had a 50%  proximal stenosis and 40% mid stenosis.  The PDA had 70% mid followed by  80% mid stenosis.  The patient underwent PTCA and stenting of the mid  LAD with a Cypher stent.  She was initially in a clinical trial but did  not tolerate the medication and had this stopped.  She missed some  appointments and is now coming back for followup.  She does get some  chest discomfort and has taken about a bottle and a half of  nitroglycerin since her October stenting.  This happens at rest.  She is  very inactive.  She cannot necessarily bring it on.  It typically goes  away with the nitroglycerin.  It is a burning discomfort similar to her  previous angina but not nearly as severe.  She thinks it has been  happening with the same frequency and intensity.  She does not have any  new shortness of breath, does not have any PND or orthopnea.  She does  not describe any palpitations, presyncope or syncope.  She is bothered  by neck and shoulder pain which her primary has diagnosed as rheumatoid  arthritis.   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   ALLERGIES:  None.   MEDICATIONS:  1. Medications are insulin.  2. Aspirin 325 mg daily.  3. Plavix 75 mg.  4. Carvedilol 25 mg b.i.d.  5. Benazepril 40 mg daily.   REVIEW OF SYSTEMS:  As stated in the HPI and otherwise negative for  other systems.   PHYSICAL EXAMINATION:  GENERAL:  The patient is in no distress.  VITAL SIGNS:  Blood pressure 178/86, rate 66 and regular, weight 175  pounds, body mass index 28.  HEENT:  Eyes are unremarkable.  Pupils equal, round and reactive to  light.  Fundi not visualized.  Oral mucosa remarkable, edentulous.  NECK:  No jugular venous distention at 45 degrees, carotid upstroke  brisk and symmetrical.  Positive bilateral bruits, no thyromegaly.  LYMPHATICS:  No cervical, axillary or inguinal adenopathy.  LUNGS:  Clear to auscultation bilaterally.  BACK:  No costovertebral angle tenderness.  CHEST:  Unremarkable.  HEART:  PMI not displaced or sustained, S1 and S2 within normal limits.  No S3, no S4, 2/6 apical early systolic murmur slightly radiating out  the aortic outflow tract, no diastolic murmurs.  ABDOMEN:  Flat, positive bowel sounds, normal in frequency and pitch.  No bruits, no rebound, no guarding or midline pulsatile mass.  No  hepatomegaly or splenomegaly.  SKIN:  No rashes, no nodules.  EXTREMITIES:  2+ pulses, mild bilateral right lower extremity edema.  NEURO:  Oriented to person, place and time.  Cranial nerves II-XII  grossly intact, motor grossly intact.   EKG sinus rhythm, rate 66, axis within normal limits, ST elevation  anterior leads unchanged from previous consistent with repolarization.   ASSESSMENT AND PLAN:  1. Chest pain.  The patient seems to have a stable angina pattern.  At      this point I am going to add Imdur 60 mg daily.  She will  continue      with risk reduction and will present to the emergency room if she      has any increasing pattern of her discomfort.  2. Hypertension.  Blood pressure is not well-controlled.  She is going      to need to have close followup of this and may benefit from the      addition of amlodipine going forward.  I will defer to [name]      but will see her back in followup.  She may also have some response      with the Imdur.  3. Renal insufficiency and this is followed by Dr. Kristian [name] and      apparently stable.  4. Diabetes mellitus per [name].  5. Followup.  I would like to see her back in about 3 months or sooner      if she has any change in her symptoms or further problems.     Shelby Rotunda, MD, [facility]  Electronically Signed    JH/MedQ  DD: 02/13/2008  DT: 02/13/2008  Job #: [id number]   cc:   [name]

## 2011-04-12 NOTE — Assessment & Plan Note (Signed)
[facility]                            CARDIOLOGY OFFICE NOTE   NAME:[name], [name]                      MRN:          [id number]  DATE:04/30/2008                            DOB:          13-May-1932    PRIMARY CARE PHYSICIAN:  Prudy Feeler.   REASON FOR PRESENTATION:  Evaluate the patient with coronary disease.   HISTORY OF PRESENT ILLNESS:  The patient is 60.  She presents for follow-  up of chest pain and coronary disease.  In March, I started her on Imdur  because she was having chest discomfort.  She says since that time she  is rarely getting any chest discomfort.  She is not overly active.  However, with her mild household chores, she is not getting chest  discomfort, neck or arm discomfort.  Not any palpitations, presyncope or  syncope.  She has some resting discomfort in her shoulder and in the  dorsum of her hand that is exacerbated by movement, and she thinks maybe  her rheumatoid arthritis.   Coincidently, the patient fell yesterday.  She missed a step and hit her  face.  She now has two black eyes and of a hematoma on her forehead.  She went to the ER last night where she had a negative CT of her head  and neck.  At that time, she was said to be hypertensive which happens  when she has pain like the events of last night.  Her blood pressure is  reasonable today.   PAST MEDICAL HISTORY:  1. Coronary artery disease (October 2008 non-Q-wave MI, Left main      normal, LAD 30-40% proximal, 40% mid stenosis followed by 70%-80%      mid stenosis, 50% second diagonal stenosis, circumflex 70% stenosis      and OM1 followed by 80-90% stenosis in the midportion, small OM-2      at 80% stenosis.  The right coronary artery had 50% stenosis      followed by 40% stenosis.  The PDA had 70 followed by 80% stenosis.      The patient underwent PTCA and stenting of the mid-LAD of the      Cypher stent).  2. Hypertension.  3. Diabetes mellitus.  4.  Cerebrovascular accident with residual right lower extremity      deficit.  5. Tuberculosis as a child.  6. Hysterectomy.   ALLERGIES:  None.   MEDICATIONS:  1. Insulin.  2. Aspirin 325 mg daily.  3. Plavix 75 mg daily.  4. Carvedilol 25 mg b.i.d.  5. Benazepril 40 mg daily.  6. Imdur 60 mg daily.   REVIEW OF SYSTEMS:  As stated in the HPI and otherwise negative for  other systems.   PHYSICAL EXAMINATION:  GENERAL:  The patient is in no distress.  VITAL SIGNS:  Blood pressure 138/64, heart rate 68 and regular, weight  174 pounds, body mass index 28.  HEENT:  Eyelids ecchymotic, pupils equal, round, reactive, fundi not  visualized, oral mucosa normal.  NECK:  No jugular distention at 45 degrees, carotid upstroke brisk and  symmetric,  right greater than left carotid bruit, thyromegaly.  LYMPHATICS:  No cervical, axillary, inguinal adenopathy.  LUNGS:  Clear to auscultation bilaterally.  BACK:  No costovertebral tenderness.  CHEST:  Normal.  HEART:  PMI not displaced or sustained, S1-S2 within normal.  No S3-S4,  2/6 apical early systolic murmur slightly radiating out the aortic  outflow tract, no diastolic murmurs.  ABDOMEN:  Flat, positive bowel sounds normal frequency pitch, no bruits,  rebound, guarding or midline pulsatile mass.  No hepatosplenomegaly.  SKIN:  No rashes, no nodules.  EXTREMITIES:  Pulses 2+ throughout, trace bilateral lower extremity  edema.  NEUROLOGICAL:  Oriented to person, place and time, cranial nerves II-XII  grossly intact, motor grossly intact.   STUDIES:  EKG sinus rhythm, rate 68, axis within normal limits,  intervals within normal limits, ST elevation in the anterior leads with  some lateral ST depression unchanged from previous EKGs probable  repolarization changes.   ASSESSMENT/PLAN:  1. Coronary disease.  The patient has coronary disease as described,      but does not have any ongoing angina.  No further cardiovascular      testing is  suggested.  She will continue on medicines as listed.  2. Dyslipidemia.  I do note that she has had dyslipidemia in the past      and currently is not on a statin.  She said she could not afford      it.  I will go ahead and start simvastatin 20 mg daily.  She is      getting written instructions to get a liver test and lipids checked      in 8 weeks fasting.  She can have this done by her primary care      physician.  I will defer management to Ms. Yetta Barre.  The goal is LDL      less than 100, HDL greater than 50.  3. Hypertension.  Blood pressure is controlled today.  I think it goes      up when she is excited or in pain.  She will remain on the same      medications for now.  4. Renal insufficiency.  This is followed by Dr. Kristian Covey.  5. Follow-up.  I will see her back in about 6 months or sooner if      needed.     Rollene Rotunda, MD, [facility]  Electronically Signed   JH/MedQ  DD: 04/30/2008  DT: 04/30/2008  Job #: [id number]   cc:   c/o Dr. Samuel Jester Prudy Feeler

## 2011-04-12 NOTE — Cardiovascular Report (Signed)
[name], [name]               ACCOUNT NO.:  [id number]   MEDICAL RECORD NO.:  [id number]          PATIENT TYPE:  INP   LOCATION:  4705                         FACILITY:  [facility]   PHYSICIAN:  Bevelyn Buckles. Bensimhon, MDDATE OF BIRTH:  1931/12/22   DATE OF PROCEDURE:  09/27/2007  DATE OF DISCHARGE:                            CARDIAC CATHETERIZATION   PRIMARY CARDIOLOGIST:  Learta Codding, MD,FACC   PATIENT IDENTIFICATION:  [name] is a very pleasant 77 year old  woman with a history of diabetes, severe hypertension, hyperlipidemia  and obesity.  She also has a history of coronary artery disease, and she  is status post balloon angioplasty of the mid-LAD in February 2003 by  Dr. Chales Abrahams.  She was admitted with chest pain concerning for unstable  angina.  She is referred for cardiac catheterization.   PROCEDURES PERFORMED:  1. Selective coronary angiography.  2. Left heart cath.  3. Left ventriculogram.  4. Abdominal aortogram.  5. AngioSeal femoral artery closure.   DESCRIPTION OF PROCEDURE:  The risks and indication of procedure were  explained.  Consent was signed and placed on the chart.  A 6-French  arterial sheath was placed in the right femoral artery using a modified  Seldinger technique.  Standard catheters including JL-3.5, JR-4 and  angled pigtail were used for procedure.  All catheter exchanges were  made over wire.  There were no apparent complications.  At the end of  the procedure, the patient's right femoral arteriotomy site was closed  using an Angio-Seal closure device.  There was some mild subcutaneous  oozing and some epi with lidocaine was injected and the oozing was  stopped.   HEMODYNAMIC RESULTS:  Central aortic pressure 190/75 with a mean of 122.  LV pressure 171/9 with EDP of 16.  No aortic stenosis.   Left main was normal.   LAD was a long vessel coursing to the apex.  It gave off a small first  diagonal, a moderate-to-large second diagonal and a  small third  diagonal.  There is a 30-40% tubular lesion in the proximal LAD, a 40%  lesion in the midsection followed by a 70-80% lesion in the mid-LAD.  There is a 50% lesion in the second diagonal.   Left circumflex gave off a tiny ramus, a large OM-1 and small OM-2 and a  small posterolateral.  There was a 70% lesion in the proximal portion of  the OM-1 followed by an 80-90% lesion in the midportion.  In the small  OM-2, there was 80% lesion.   Right coronary artery was a dominant vessel, gave off an RV branch, a  large branching PDA and a small posterolateral.  There was a 50% lesion  in the proximal RCA, a 40% lesion in the midsection, PDA had moderate  diffuse disease.  There was 70% focal lesion in the midsection and an  80% lesion in the branch off the PDA.   Left ventriculogram was attempted.  As the power injector was injecting,  the catheter was ejected out of the left ventriculogram and essentially  an aortic root shot was performed that  showed normal aortic root with  trivial aortic insufficiency.   Abdominal aortogram done to evaluate her hypertension showed patent  bilateral renal arteries.   ASSESSMENT:  1. Three-vessel coronary artery disease as described above.  2. Severe hypertension with patent renals.   PLAN/DISCUSSION:  I will review the films with my colleagues.  I suspect  she will need a percutaneous intervention on the OM-1 and possibly the  LAD in the morning.  We will load Plavix and treat her hypertension.      Bevelyn Buckles. Bensimhon, MD  Electronically Signed     DRB/MEDQ  D:  09/27/2007  T:  09/28/2007  Job:  [id number]

## 2011-04-15 NOTE — Discharge Summary (Signed)
[place]  Patient:    [name], [name] Visit Number: [id number] MRN: [id number]          Service Type: MED Location: [phone] Attending Physician:  Rollene Rotunda Dictated by:   Pennelope Bracken, N.P. Admit Date:  01/22/2002 Discharge Date: 01/25/2002   CC:         Delaney Meigs, M.D.   Discharge Summary  DATE OF BIRTH:  [birth year]  REASON FOR ADMISSION:  Chest pain.  DISCHARGE DIAGNOSES: 1. Coronary artery disease status post angioplasty of the mid left anterior    descending artery. 2. Hyperlipidemia. 3. Hypertension. 4. Abnormality on chest x-ray. 5. Diabetes mellitus. 6. History of cerebrovascular accident with residual right lower extremity    deficit. 7. History of tuberculosis as a child per patient.  HISTORY OF PRESENT ILLNESS:  This delightful 77 year old white lady without prior cardiac history gives a history of two weeks of exertional chest discomfort.  She describes this as a burning that recedes when she stops her activity.  She saw Dr. Lysbeth Galas who recommended that she present to our emergency room, through which she was admitted for further evaluation and treatment.  HOSPITAL COURSE:  Patient was admitted and her pain relieved.  Her EKG revealed sinus tachycardia with evidence of left ventricular hypertrophy and possible old anteroseptal myocardial infarction.  She was treated with beta blocker, IV nitroglycerin, and Lovenox, and maintained on her home medications.  A cardiac catheterization was performed by Dr. Veneda Melter on January 24, 2002.  This revealed diffusely diseased arteries with normal left ventricular function.  An angioplasty was performed on the mid LAD where 60% stenosis was reduced to 20% without evidence of compromised flow to the second diagonal.  She tolerated the procedure well and was returned to the floor in stable condition.  The patient did experience one episode of nausea and  vomiting post catheterization on the day after catheterization.  Patient attributes this to an intolerance to anesthesia.  She otherwise recovered uneventfully but did have periods of hypertension in spite of aggressive medical management.  She had some reduction in hemoglobin and hematocrit during her stay, but guaiac stool tests were negative.  PHYSICAL EXAMINATION:  GENERAL:  On day of discharge patient offered no complaints of chest pain or dyspnea.  She was very anxious to go home and so declined staying for PA and lateral lordotic views of her chest to investigate a pulmonary nodule that was seen on x-ray the day of admission.  Both the patient and her daughter agree that they do not want this investigated at this time.  VITAL SIGNS:  Blood pressure 170/75, pulse 76, respirations 20, pulse oximetry 95% on room air.  Telemetry overnight revealed normal sinus rhythm without ectopy.  MENTAL STATUS:  The patient was alert and oriented, appropriate, in no acute distress.  LUNGS:  Clear to auscultation bilaterally.  CARDIOVASCULAR:  Regular rate and rhythm.  A 2/6 systolic ejection murmur was heard, loudest at the right sternal border.  S1 and S2 were clear.  ABDOMEN:  Nontender.  EXTREMITIES:  Without cyanosis, clubbing, or edema.  Right groin site was stable without evidence of hematoma or bruit.  LABORATORY VALUES:  On day of discharge, chemistry revealed sodium 136, potassium 4.5, chloride 103, CO2 30, BUN 16, creatinine 1.0, glucose elevated at 180.  Hemogram revealed WBC 3.9, hemoglobin 10.9 (this was 9.9 yesterday), hematocrit 31.4, MCV 34.8, platelets 178.  Lipid profile reveals a high total  cholesterol at 285 and triglycerides high at 496; other values within normal range.  Cardiac enzymes remained negative during her evaluation.  A 12-lead EKG reveals normal sinus rhythm, evidence of left ventricular hypertrophy, and ST segment flattening in leads I, V5, and V6.   This is unchanged from admission EKG.  Chest x-ray on admission revealed a possible 1 cm nodule in the right upper lobe.  No signs of edema or pneumonia.  DISPOSITION:  The patient is discharged to home in the care of her daughter on the following medications.  DISCHARGE MEDICATIONS: 1. Norvasc 10 mg one q.d.  This represents a doubling of her present dose. 2. Lopressor 50 mg one b.i.d. 3. Plavix 75 mg one q.d. with food. 4. Aspirin 325 one q.d. 5. Insulin 70/30 25 units b.i.d. as before. 6. Nitroglycerin 0.4 mg SL one every five minutes x 3 p.r.n. chest pain.  ACTIVITY:  No driving, heavy lifting, or tub baths for two days.  DIET RECOMMENDED:  Diabetic diet.  The patients elevated triglycerides may be indicative of her consumption of too many simple carbohydrates and she is made aware of this.  She was seen by our diabetes coordinator on February 26 for an evaluation and she does promise to see her diabetes educator in [place] as soon as possible.  WOUND CARE:  The patient is to call the office if her groin wound becomes hard or painful.  SPECIAL INSTRUCTIONS:  Patient is instructed to take her blood sugars and blood pressures every day and record them, and share them with her primary care Dellar Traber.  She is made aware that she has a normocytic anemia by her laboratory evaluation and says she will bring this to her primary physicians attention as well.  FOLLOW-UP:  With Dr. Antoine Poche in [facility] February 13, 2002 at 3:30 p.m.  Patient also agrees to attend cardiac rehab phase 2 in [place].  She knows to call in the interim with any problems, questions, or concerns, or change or increase in symptoms. Dictated by:   Pennelope Bracken, N.P. Attending Physician:  Rollene Rotunda DD:  01/25/02 TD:  01/25/02 Job: [id number]

## 2011-04-15 NOTE — H&P (Signed)
[place]  Patient:    [name], [name] Visit Number: [id number] MRN: [id number]          Service Type: MED Location: [phone] Attending Physician:  Rollene Rotunda Dictated by:   Rollene Rotunda, M.D. Admit Date:  01/22/2002   CC:         Delaney Meigs, M.D.   History and Physical  REASON FOR PRESENTATION:  Patient with chest pain and hypertension.  HISTORY OF PRESENT ILLNESS:  The patient is a lovely 77 year old white female without prior cardiac history but with multiple cardiovascular risk factors. She reports two weeks of chest discomfort.  This has been occurring with activity.  She describes a burning discomfort that goes away about ten minutes after she stops what she is doing.  This has become more intense.  It is unlike any discomfort she has had in the past.  It has not occurred at rest. She is not describing any radiation into her neck or into her arms.  She is not describing any associated symptoms such as nausea, vomiting or diaphoresis.  She did have discomfort this morning while making the bed.  She also had slight discomfort when she presented to the emergency room.  She was seen by Dr. Lysbeth Galas who felt this was very suspicious for unstable angina.  She is also noted to be quite hypertensive and was sent to the emergency room. She is currently pain free.  PAST MEDICAL HISTORY: 1. Hypertension since 1982. 2. Diabetes mellitus since 1987. 3. Hyperlipidemia since 1992. 4. Cerebrovascular accident in October 1996 with mild right lower extremity    residual weakness.  PAST SURGICAL HISTORY:  Appendectomy, left knee surgery.  ALLERGIES:  DARVOCET "nausea and vomiting."  MEDICATIONS: 1. Lotensin 20 mg q.d. 2. Humulin 70/30 25 units b.i.d. 3. Norvasc. 4. "Another blood pressure medication." 5. Aspirin.  SOCIAL HISTORY:  The patient is a widow and lives in [place].  She has six children.  Her brother lives with her.   She does not smoke and never has.  She does not drink alcohol.  She use to work at the [employer].  FAMILY HISTORY:  Contributory for father dying at age 31 of myocardial infarction.  REVIEW OF SYSTEMS:  As stated in the history of present illness and otherwise positive for chronic right lower extremity swelling with varicosities, mild joint pains, headaches, slowly progressive weight gain.  Negative for all other systems.  PHYSICAL EXAMINATION:  GENERAL:  The patient is in no distress.  VITAL SIGNS:  Blood pressure 213/91, heart rate 90 and regular.  HEENT:  Eyelids unremarkable.  Pupils are equal, round and reactive to light. Fundi not visualized.  Oral mucosa unremarkable, edentulous.  NECK:  No jugular venous distension, carotid upstrokes are brisk and symmetric with no bruits, no thyromegaly, transmitted systolic murmur.  LUNGS:  Clear to auscultation bilaterally.  BACK:  No costovertebral angle tenderness.  CHEST:  Unremarkable.  HEART:  PMI not displaced or sustained.  S1 and S2 within normal limits.  No S3, no S4.  There is a 3/6 apical systolic murmur radiating  out the aortic outflow tract, no diastolic murmurs.  ABDOMEN:  Flat, positive bowel sounds, normal in frequency.  No bruits, rebound, guarding.  Midline pulse, no masses, no hepatosplenomegaly.  SKIN:  No rash.  EXTREMITIES:  Pulses 2+ throughout.  No edema.  NEUROLOGIC:  Oriented to person, place and time.  Cranial nerves II-XII are grossly intact, motor is grossly  intact throughout.  EKG revealed sinus rhythm, rate 103, axis within normal limits, intervals within normal limits.  Left ventricular hypertrophy with probable repolarization changes, cannot exclude old anteroseptal myocardial infarction.  Chest x-ray read as possible 1 cm nodule in the right upper lobe.  There is no edema.  There is no pneumonia.  LABS:  WBC 4.5, hemoglobin 11.2, platelets 189, sodium 141, potassium 4.2, BUN 16,  creatinine .9, CK 61.  ASSESSMENT AND PLAN: 1. The patients chest discomfort is suggestive of unstable angina.  Her ST segment depression was more prominent when she was slightly more tachycardic in Dr. Deitra Mayo office.  Given this, the plan will be elective cardiac catheterization.  She will be treated with beta blockers, IV nitroglycerin, Lovenox, aspirin.  Will add a IIB3A inhibitor if she has any recurrent chest discomfort.  2. Hypertension.  The patients blood pressure will be controlled with the above plus the addition of Norvasc versus an ACE inhibitor as needed.  Will continue the Norvasc for now since this was an outpatient medicine.  3. Risk factor modification.  We will check hemoglobin A1C and lipid profile. She will need a statin.  4. Diabetes mellitus.  She will be continued on her outpatient regimen with coverage for sliding scale.  5. Pulmonary nodule.  The patient will have PA and lateral chest x-ray with lordotic views. Dictated by:   Rollene Rotunda, M.D. Attending Physician:  Rollene Rotunda DD:  01/22/02 TD:  01/22/02 Job: [id number]

## 2011-04-15 NOTE — Procedures (Signed)
[facility]  Patient:    [name], [name] Visit Number: [id number] MRN: [id number]          Service Type: OUT Location: RAD Attending Physician:  Colon Flattery Dictated by:   [place] Bing, M.D. Proc. Date: 04/02/02 Admit Date:  04/02/2002 Discharge Date: 04/02/2002                              Echocardiograms  REFERRING PHYSICIAN:  Colon Flattery, D.O. and Rollene Rotunda, M.D.  CLINICAL DATA:  A 77 year old woman with coronary artery disease, hypertension, and a systolic murmur.  1. Technically adequate echocardiographic study. 2. Mild left atrial enlargement, normal right atrial size. 3. Normal right ventricular size and function.  Mild RVH. 4. Mild mitral valve thickening with very mild regurgitation. 5. Normal aortic valve with annular calcification. 6. Normal tricuspid and pulmonic valves. 7. Normal internal dimension of the left ventricle, mild concentric LVH,    normal regional and global LV systolic function. Dictated by:   [place] Bing, M.D. Attending Physician:  Colon Flattery DD:  04/02/02 TD:  04/04/02 Job: [id number]

## 2011-04-15 NOTE — Cardiovascular Report (Signed)
[facility]  Patient:    [name], [name] Visit Number: [id number] MRN: [id number]          Service Type: MED Location: [phone] Attending Physician:  Rollene Rotunda Dictated by:   Veneda Melter, M.D. Proc. Date: 01/24/02 Admit Date:  01/22/2002   CC:         Rollene Rotunda, M.D. [facility]  Delaney Meigs, M.D.   Cardiac Catheterization  PROCEDURES PERFORMED: 1. Left heart catheterization. 2. Left ventriculogram. 3. Selective coronary angiography. 4. Abdominal aortogram. 5. Percutaneous transluminal coronary angioplasty of the mid left anterior    descending.  DIAGNOSES: 1. Coronary artery disease. 2. Normal left ventricular systolic function. 3. Unstable angina.  INDICATIONS: The patient is a 77 year old, white female with a history of diabetes mellitus, hypertension, and dyslipidemia, who presents with substernal chest discomfort that has been increasing for the past two weeks time. The patient was admitted to the hospital, ruled out for acute myocardial infarction. She had ECG changes in the anterolateral epicardial leads with poor R wave progression and she presents now for further cardiac assessment.  TECHNIQUE: Informed consent was obtained, the patient was brought to the cardiac catheterization lab where a 6 French sheath was placed in the right femoral artery using the modified Seldinger technique. Left ventriculogram and abdominal aortogram were then performed using a 6 French pigtail catheter. Selective coronary angiography using manual injections of contrast through 6 French preformed Judkins catheters.  FINDINGS: Initial findings are as follows: 1. Left main trunk is a large caliber vessel with mild irregularities. 2. LAD is a large caliber vessel and provides three diagonal branches as    it extends to the apex. The LAD has moderate calcification in the    proximal segment with disease of 30%. The mid LAD after the  second    diagonal branch has a focal narrowing of 70-80%. There is a long tubular    narrowing of 60% in the distal LAD prior to the apex.  The first    diagonal branch is a small caliber vessel with moderate disease. The    second diagonal branch is a larger vessel with a long tubular narrowing    of 70% in the mid section. The third diagonal branch is a trivial caliber    vessel. 3. Left circumflex artery: This again is a large caliber vessel and provides    a large bifurcating first marginal branch in the proximal segment, two    smaller marginal branches distally. The AV circumflex has moderate disease    of 30%. The first marginal branch has a narrowing of 50% in the mid    section. 4. Ramus intermedius is a trivial vessel with a high-grade narrowing of 90%    in its proximal segment. 5. Right coronary artery is dominant. This is a large caliber vessel that    provides the posterior descending artery and two small posterior    ventricular branches in the terminal segment.  The right coronary artery    has moderate diffuse disease of 30-40% in the proximal mid section and    posterior descending artery has tubular narrowing of 50% in the proximal    segment. The first posterior ventricular branch is a small caliber    vessel and has moderate narrowing of 60-70% in the distal segment.  LEFT VENTRICULOGRAM: Normal end-systolic and end-diastolic dimensions. Overall left ventricular function is well preserved, ejection fraction of greater than 55%.  There is no mitral  regurgitation. LV pressure is 190/15, aortic is 190/80, LVEDP equals 25.  ABDOMINAL AORTOGRAM: Abdominal aorta is of normal caliber. There is only mild atheromatous buildup in the infrarenal segment. There is one right and two left renal arteries, which are widely patent without significant disease. The iliac arteries are also patent with only mild atheromatous buildup.  PERCUTANEOUS TRANSLUMINAL CORONARY ANGIOPLASTY  PROCEDURE: With these findings, we elected to proceed with percutaneous intervention of the mid LAD. The patient was given 300 mg of Plavix early as well as heparin and ReoPro on a weight-adjusted basis to maintain ACT of approximately 250 seconds. A 6 [place] Voda, left 4 guide catheter was used to engage the left coronary artery and a 0.014 inch extra-support wire advanced in the distal LAD. A 2.5 x 12 mm Quantum Maverick balloon was introduced and several inflations performed in the mid LAD at up to 12 atmospheres for 60 seconds. Repeat angiography after the administration of intracoronary nitroglycerin showed an excellent result with less than 20% residual narrowing. No evidence of distal vessel damage and no compromise of flow to the second diagonal branch. Due to the proximity of the lesion to the second diagonal branch, we elected to accept this result. Final angiography was performed in various projections confirming no distal vessel damage and TIMI-3 flow through the left coronary system. The guide catheter was then removed, the sheath secured into position. The patient tolerated the procedure well and was transferred to the ward in stable condition.  FINAL RESULTS: Successful percutaneous transluminal coronary angioplasty of the mid left anterior descending with reduction of 80% narrowing to less than 20%. Dictated by:   Veneda Melter, M.D. Attending Physician:  Rollene Rotunda DD:  01/24/02 TD:  01/24/02 Job: [id number]

## 2011-04-26 ENCOUNTER — Encounter: Payer: Self-pay | Admitting: Physician Assistant

## 2011-08-25 ENCOUNTER — Other Ambulatory Visit: Payer: Self-pay

## 2011-08-25 ENCOUNTER — Encounter (HOSPITAL_COMMUNITY): Payer: Self-pay | Admitting: *Deleted

## 2011-08-25 ENCOUNTER — Emergency Department (HOSPITAL_COMMUNITY)
Admission: EM | Admit: 2011-08-25 | Discharge: 2011-08-25 | Disposition: A | Discharge status: Other Acute Inpatient Hospital | Payer: Medicare Other | Source: Home / Self Care | Attending: Emergency Medicine | Admitting: Emergency Medicine

## 2011-08-25 ENCOUNTER — Emergency Department (HOSPITAL_COMMUNITY): Payer: Medicare Other

## 2011-08-25 ENCOUNTER — Inpatient Hospital Stay (HOSPITAL_COMMUNITY)
Admission: AD | Admit: 2011-08-25 | Discharge: 2011-08-28 | DRG: 292 | Disposition: A | Discharge status: Home / Self Care | Payer: Medicare Other | Source: Other Acute Inpatient Hospital | Attending: Cardiology | Admitting: Cardiology

## 2011-08-25 DIAGNOSIS — Z9861 Coronary angioplasty status: Secondary | ICD-10-CM | POA: Insufficient documentation

## 2011-08-25 DIAGNOSIS — E119 Type 2 diabetes mellitus without complications: Secondary | ICD-10-CM | POA: Insufficient documentation

## 2011-08-25 DIAGNOSIS — Z79899 Other long term (current) drug therapy: Secondary | ICD-10-CM | POA: Insufficient documentation

## 2011-08-25 DIAGNOSIS — N289 Disorder of kidney and ureter, unspecified: Secondary | ICD-10-CM | POA: Insufficient documentation

## 2011-08-25 DIAGNOSIS — I200 Unstable angina: Secondary | ICD-10-CM | POA: Insufficient documentation

## 2011-08-25 DIAGNOSIS — I69998 Other sequelae following unspecified cerebrovascular disease: Secondary | ICD-10-CM | POA: Insufficient documentation

## 2011-08-25 DIAGNOSIS — I5032 Chronic diastolic (congestive) heart failure: Secondary | ICD-10-CM | POA: Diagnosis present

## 2011-08-25 DIAGNOSIS — E7800 Pure hypercholesterolemia, unspecified: Secondary | ICD-10-CM | POA: Insufficient documentation

## 2011-08-25 DIAGNOSIS — R079 Chest pain, unspecified: Secondary | ICD-10-CM

## 2011-08-25 DIAGNOSIS — R739 Hyperglycemia, unspecified: Secondary | ICD-10-CM

## 2011-08-25 DIAGNOSIS — Z8673 Personal history of transient ischemic attack (TIA), and cerebral infarction without residual deficits: Secondary | ICD-10-CM

## 2011-08-25 DIAGNOSIS — I2510 Atherosclerotic heart disease of native coronary artery without angina pectoris: Secondary | ICD-10-CM | POA: Insufficient documentation

## 2011-08-25 DIAGNOSIS — I10 Essential (primary) hypertension: Secondary | ICD-10-CM | POA: Insufficient documentation

## 2011-08-25 DIAGNOSIS — N189 Chronic kidney disease, unspecified: Secondary | ICD-10-CM | POA: Diagnosis present

## 2011-08-25 DIAGNOSIS — Z951 Presence of aortocoronary bypass graft: Secondary | ICD-10-CM | POA: Insufficient documentation

## 2011-08-25 DIAGNOSIS — I130 Hypertensive heart and chronic kidney disease with heart failure and stage 1 through stage 4 chronic kidney disease, or unspecified chronic kidney disease: Principal | ICD-10-CM | POA: Diagnosis present

## 2011-08-25 DIAGNOSIS — Z7982 Long term (current) use of aspirin: Secondary | ICD-10-CM

## 2011-08-25 DIAGNOSIS — I059 Rheumatic mitral valve disease, unspecified: Secondary | ICD-10-CM | POA: Diagnosis present

## 2011-08-25 HISTORY — DX: Pure hypercholesterolemia, unspecified: E78.00

## 2011-08-25 HISTORY — DX: Heart failure, unspecified: I50.9

## 2011-08-25 LAB — COMPREHENSIVE METABOLIC PANEL
Alkaline Phosphatase: 58 U/L (ref 39–117)
BUN: 24 mg/dL — ABNORMAL HIGH (ref 6–23)
Chloride: 102 mEq/L (ref 96–112)
Creatinine, Ser: 1.86 mg/dL — ABNORMAL HIGH (ref 0.50–1.10)
GFR calc Af Amer: 32 mL/min — ABNORMAL LOW (ref >60)
Glucose, Bld: 236 mg/dL — ABNORMAL HIGH (ref 70–99)
Potassium: 4.2 mEq/L (ref 3.5–5.1)
Total Bilirubin: 0.2 mg/dL — ABNORMAL LOW (ref 0.3–1.2)
Total Protein: 6.5 g/dL (ref 6.0–8.3)

## 2011-08-25 LAB — PROTIME-INR
INR: 0.91 (ref 0.00–1.49)
Prothrombin Time: 12.4 seconds (ref 11.6–15.2)

## 2011-08-25 LAB — CBC
HCT: 33.8 % — ABNORMAL LOW (ref 36.0–46.0)
Hemoglobin: 11.4 g/dL — ABNORMAL LOW (ref 12.0–15.0)
MCH: 29.8 pg (ref 26.0–34.0)
MCHC: 33.7 g/dL (ref 30.0–36.0)
MCV: 88.5 fL (ref 78.0–100.0)
Platelets: 187 K/uL (ref 150–400)
RBC: 3.82 MIL/uL — ABNORMAL LOW (ref 3.87–5.11)
RDW: 13.2 % (ref 11.5–15.5)
WBC: 4.1 K/uL (ref 4.0–10.5)

## 2011-08-25 LAB — APTT: aPTT: 29 seconds (ref 24–37)

## 2011-08-25 LAB — COMPREHENSIVE METABOLIC PANEL WITH GFR
ALT: 8 U/L (ref 0–35)
AST: 9 U/L (ref 0–37)
Albumin: 3.5 g/dL (ref 3.5–5.2)
CO2: 28 meq/L (ref 19–32)
Calcium: 10.3 mg/dL (ref 8.4–10.5)
GFR calc non Af Amer: 26 mL/min — ABNORMAL LOW (ref >60)
Sodium: 138 meq/L (ref 135–145)

## 2011-08-25 LAB — POCT I-STAT TROPONIN I: Troponin i, poc: 0.06 ng/mL (ref 0.00–0.08)

## 2011-08-25 LAB — MAGNESIUM: Magnesium: 1.9 mg/dL (ref 1.5–2.5)

## 2011-08-25 MED ORDER — METOPROLOL TARTRATE 1 MG/ML IV SOLN
INTRAVENOUS | Status: AC
  Filled 2011-08-25: qty 5

## 2011-08-25 MED ORDER — NITROGLYCERIN IN D5W 200-5 MCG/ML-% IV SOLN
10.0000 ug/min | INTRAVENOUS | Status: DC
  Administered 2011-08-25: 10 ug/min via INTRAVENOUS
  Filled 2011-08-25: qty 250

## 2011-08-25 MED ORDER — ASPIRIN 81 MG PO CHEW: 162.0000 mg | CHEWABLE_TABLET | Freq: Once | ORAL | Status: DC

## 2011-08-25 MED ORDER — SODIUM CHLORIDE 0.9 % IV SOLN
20.0000 mL | INTRAVENOUS | Status: DC
  Administered 2011-08-25: 20 mL via INTRAVENOUS

## 2011-08-25 MED ORDER — HEPARIN (PORCINE) IN NACL 100-0.45 UNIT/ML-% IJ SOLN
INTRAMUSCULAR | Status: AC
  Filled 2011-08-25: qty 250

## 2011-08-25 MED ORDER — HEPARIN (PORCINE) IN NACL 100-0.45 UNIT/ML-% IJ SOLN
10.0000 [IU]/kg/h | Freq: Once | INTRAMUSCULAR | Status: AC
  Administered 2011-08-25: 19:00:00 via INTRAVENOUS

## 2011-08-25 MED ORDER — INSULIN ASPART 100 UNIT/ML ~~LOC~~ SOLN
4.0000 [IU] | Freq: Once | SUBCUTANEOUS | Status: AC
  Administered 2011-08-25: 4 [IU] via SUBCUTANEOUS

## 2011-08-25 NOTE — ED Notes (Signed)
Pt BP remains elevated, order received from physician for Metoprolol 5 mg IV [facility] Renee Rival, RN) to give enroute, Nitroglycerin increased to 30 mcg/min.

## 2011-08-25 NOTE — ED Notes (Signed)
Nitro drip increased to 15 mcg/min, vitals: 264/113, HR 104, 96%, RR 15

## 2011-08-25 NOTE — ED Notes (Signed)
Pt pain free, denies chest pain at this time.  Pt given diet coke to drink and a [place] sandwhich.

## 2011-08-25 NOTE — ED Notes (Signed)
Pt arrives to er via ems with c/o of chest burning sensation, pt states that she has one episode last week close to the weekend and took one nitro pill and the burning sensation went away, pt was fine over the weekend, starting with burning sensation again today, was given 324 asa and two nitro enroute to er by ems with improvement in pain, pt arrives to er pain free.

## 2011-08-25 NOTE — ED Provider Notes (Addendum)
History   Chart scribed for Gavin Pound. Kyrielle Urbanski, MD by Enos Fling; the patient was seen in room APA14/APA14; this patient's care was started at 5:42 PM.    CSN: [id number] Arrival date & time: 08/25/2011  5:25 PM  Chief Complaint  Patient presents with  . Chest Pain    HPI [name] is a 77 y.o. female who presents to the Emergency Department complaining of chest pain. Pt states 2 episodes of chest pain today, both onset while walking up stairs and then relieved with sublingual NTG, described as a non-radiating tightness. Pain lasted approx 10 minutes each episode. 324 ASA given by EMS; no pain currently. No diaphoresis, n/v, sob, or numbness. Pt reports similar episode last week that resolved with 1 NTG. Pt does not recall the last time she needed to use NTG at home prior to last week. Pt also reports similar pain 3 years ago with dx MI and had CABG and 2 stents. Last stress test by Dr. Sander Radon 2 years ago that she states was normal. Pt denies h/o heartburn. She states she was recently switched from Diltiazem to Metoprolol because BP has been high (170-220 systolic) for the past few months, being followed by PCP. Pt also reports leg swelling x 1 year, is on meds per PCP with no change. No increased swelling lately. Denies recent cough, congestion, fever, chills, headache, or abd pain.   PCP Ileana Ladd [facility] office)  Past Medical History  Diagnosis Date  . CAD (coronary artery disease) 2003  . CVA (cerebral vascular accident) 1996    LEFT SIDE   . IDDM (insulin dependent diabetes mellitus)   . CKD (chronic kidney disease)   . Myocardial infarction   . Hx of CABG     2 stent placements  . Congestive heart failure   . Hypercholesteremia     Past Surgical History  Procedure Date  . Appendectomy   . Knee surgery     Family History  Problem Relation Age of Onset  . Diabetes Brother     RETINOPATHY   . Drug abuse Brother   . Liver cancer Brother   . Coronary  artery disease Mother   . Breast cancer Daughter   . Heart attack Mother   . Heart attack Father   . Coronary artery disease Father   . Diabetes Sister     History  Substance Use Topics  . Smoking status: Never Smoker   . Smokeless tobacco: Not on file  . Alcohol Use: No    OB History    Grav Para Term Preterm Abortions TAB SAB Ect Mult Living                  Review of Systems 10 Systems reviewed and are negative for acute change except as noted in the HPI.  Allergies  Propoxyphene n-acetaminophen and Simvastatin  Home Medications   Current Outpatient Rx  Name Route Sig Dispense Refill  . ASPIRIN 81 MG PO TBEC Oral Take 81 mg by mouth daily.      . FUROSEMIDE 20 MG PO TABS Oral Take 20 mg by mouth every morning.     . INSULIN ISOPHANE & REGULAR (70-30) 100 UNIT/ML [place] SUSP Subcutaneous Inject 30 Units into the skin 2 (two) times daily with a meal.      . ISOSORBIDE MONONITRATE CR 60 MG PO TB24 Oral Take 60 mg by mouth every morning.     Marland Kitchen METOPROLOL TARTRATE 25 MG PO TABS  Oral Take 25 mg by mouth 2 (two) times daily.      Marland Kitchen NITROGLYCERIN 0.4 MG SL SUBL Sublingual Place 0.4 mg under the tongue every 5 (five) minutes as needed.      Frazier Butt OP Ophthalmic Apply 1 drop to eye daily.      Marland Kitchen AMLODIPINE BESYLATE 10 MG PO TABS Oral Take 10 mg by mouth daily.      Marland Kitchen CLOPIDOGREL BISULFATE 75 MG PO TABS Oral Take 75 mg by mouth daily.      Marland Kitchen DILTIAZEM HCL 120 MG PO TABS Oral Take 120 mg by mouth 2 (two) times daily as needed.      . INSULIN ASPART PROT & ASPART (70-30) 100 UNIT/ML  SUSP Subcutaneous Inject 30 Units into the skin 2 (two) times daily with a meal.        BP 210/60  Pulse 79  Temp(Src) 98.4 F (36.9 C) (Oral)  Resp 20  SpO2 96%  Physical Exam  Nursing note and vitals reviewed. Constitutional: She is oriented to person, place, and time. She appears well-developed and well-nourished. No distress.  HENT:  Head: Normocephalic and atraumatic.  Right Ear:  External ear normal.  Left Ear: External ear normal.  Nose: Nose normal.  Mouth/Throat: Oropharynx is clear and moist.  Eyes:       Normal appearance.  Neck: Neck supple.  Cardiovascular: Normal rate and regular rhythm.   Murmur heard. Pulmonary/Chest: Effort normal and breath sounds normal. No respiratory distress. She has no wheezes. She has no rales. She exhibits no tenderness.  Abdominal: Soft. There is no tenderness.  Musculoskeletal: Normal range of motion.       Normal pulses  Neurological: She is alert and oriented to person, place, and time.       Motor intact in all extremities  Skin: Skin is warm and dry.       Color normal  Psychiatric: She has a normal mood and affect.    ED Course  CRITICAL CARE Performed by: Lear Ng Authorized by: Lear Ng Total critical care time: 30 minutes Critical care time was exclusive of separately billable procedures and treating other patients. Critical care was necessary to treat or prevent imminent or life-threatening deterioration of the following conditions: cardiac failure and circulatory failure. Critical care was time spent personally by me on the following activities: development of treatment plan with patient or surrogate, evaluation of patient's response to treatment, examination of patient, obtaining history from patient or surrogate, ordering and performing treatments and interventions, ordering and review of laboratory studies, ordering and review of radiographic studies, review of old charts and re-evaluation of patient's condition.    Labs Reviewed  CBC - Abnormal; Notable for the following:    RBC 3.82 (*)    Hemoglobin 11.4 (*)    HCT 33.8 (*)    All other components within normal limits  COMPREHENSIVE METABOLIC PANEL - Abnormal; Notable for the following:    Glucose, Bld 236 (*)    BUN 24 (*)    Creatinine, Ser 1.86 (*)    Total Bilirubin 0.2 (*)    GFR calc non Af Amer 26 (*)    GFR calc Af Amer 32  (*)    All other components within normal limits  APTT  PROTIME-INR  MAGNESIUM  POCT I-STAT TROPONIN I  URINALYSIS, ROUTINE W REFLEX MICROSCOPIC  I-STAT TROPONIN I   Dg Chest Portable 1 View  08/25/2011  *RADIOLOGY REPORT*  Clinical Data: Chest  pain  PORTABLE CHEST - 1 VIEW  Comparison: Chest radiograph 07/04/2010  Findings: Heart size appears stable and within normal limits. There is aortic arch atherosclerosis.  Slight pulmonary vascular congestion.  Diffuse peribronchial thickening, appears chronic. Negative for edema, focal airspace disease, pleural effusion, or pneumothorax.  No acute bony abnormality.  IMPRESSION: Stable chest radiograph. Peribronchial thickening may reflect acute or chronic bronchitis, but is nonspecific.  Original Report Authenticated By: Britta Mccreedy, M.D.    All results reviewed and discussed, questions answered, pt agreeable with plan.   OTHER DATA REVIEWED: Nursing notes and vital signs reviewed. Prior records reviewed.  MEDS GIVEN IN ED:  Medications  0.9 %  sodium chloride infusion (20 mL Intravenous New Bag 08/25/11 1818)  nitroGLYCERIN 0.2 mg/mL in dextrose 5 % infusion (10 mcg/min Intravenous New Bag 08/25/11 1819)  insulin aspart (novoLOG) injection 4 Units   heparin ADULT infusion 100 units/mL (25000 units/250 mL) (  Intravenous New Bag 08/25/11 1913)     7:37 PM - Consult with [place] cardiology   MDM  Pt with concerning symptoms of UA.  Pt had CP resolved with NTG last week.  Again today 2 episodes that seemed to occur with walking down he 10 stairs at home, then with walking up her stairs, again relieved by NTG both times.  She has 2 stents, had neg stress test about 2 years ago per her history.  Troponin here is neg, ECG shows no new acute changes compared to old.  Will put on NTG drip to reduce MAP as she was very HTN on arrival.  [facility] shows no wide mediastinum.  She did have some back pain, but reports pain was similar to prior angina.    ECG  shows NSR at rate 82, PR prolongation.  LVH by voltage, non specific ST changes anterorlaterally.  Slightly improved from ECG in July, 2011.  Normal axis.  Q waves anteriorly with poor r wave progression.    Pt took ASA by EMS PTA.  Will discuss with [place] cardiology to transport to [facility] for further eval and management.  Pt is pain free at this time.  Will also order IV heparin for now    IMPRESSION: 1. Unstable angina   2. Hyperglycemia   3. Hypertension   4. Renal insufficiency     SCRIBE ATTESTATION: I personally performed the services described in this documentation, which was scribed in my presence. The recorded information has been reviewed and considered. [place] Haub Y.    Dr. Myrtis Ser is accepting.  Will try to increase NTG drip rate to improve BP here.  Will go to [facility] by [facility] to step down or CCU bed once available.  Pt remains CP free currently.      Gavin Pound. Avraj Lindroth, MD 08/25/11 1939   Pt is ordered metoprolol IV and RN told to increase NTG again prior to pt's transfer  Gavin Pound. Oletta Lamas, MD 08/25/11 2121

## 2011-08-25 NOTE — ED Notes (Signed)
Pt Nitro increased to 20 mcg/min, BP remains elevated.

## 2011-08-26 DIAGNOSIS — I10 Essential (primary) hypertension: Secondary | ICD-10-CM

## 2011-08-26 DIAGNOSIS — I059 Rheumatic mitral valve disease, unspecified: Secondary | ICD-10-CM

## 2011-08-26 LAB — PRO B NATRIURETIC PEPTIDE: Pro B Natriuretic peptide (BNP): 2323 pg/mL — ABNORMAL HIGH (ref 0–450)

## 2011-08-26 LAB — CARDIAC PANEL(CRET KIN+CKTOT+MB+TROPI)
CK, MB: 2.8 ng/mL (ref 0.3–4.0)
Relative Index: INVALID (ref 0.0–2.5)
Total CK: 33 U/L (ref 7–177)
Total CK: 38 U/L (ref 7–177)
Total CK: 39 U/L (ref 7–177)
Troponin I: <0.3 ng/mL (ref <0.30)

## 2011-08-26 LAB — BASIC METABOLIC PANEL
BUN: 25 mg/dL — ABNORMAL HIGH (ref 6–23)
CO2: 26 mEq/L (ref 19–32)
Chloride: 103 mEq/L (ref 96–112)
Creatinine, Ser: 1.8 mg/dL — ABNORMAL HIGH (ref 0.50–1.10)

## 2011-08-26 LAB — RENAL FUNCTION PANEL
BUN: 25 mg/dL — ABNORMAL HIGH (ref 6–23)
Chloride: 102 mEq/L (ref 96–112)
Glucose, Bld: 208 mg/dL — ABNORMAL HIGH (ref 70–99)
Potassium: 3.9 mEq/L (ref 3.5–5.1)

## 2011-08-26 LAB — LIPID PANEL
HDL: 40 mg/dL (ref >39)
LDL Cholesterol: UNDETERMINED mg/dL (ref 0–99)
Total CHOL/HDL Ratio: 7.1 RATIO
VLDL: UNDETERMINED mg/dL (ref 0–40)

## 2011-08-26 LAB — GLUCOSE, CAPILLARY
Glucose-Capillary: 182 mg/dL — ABNORMAL HIGH (ref 70–99)
Glucose-Capillary: 147 mg/dL — ABNORMAL HIGH (ref 70–99)
Glucose-Capillary: 122 mg/dL — ABNORMAL HIGH (ref 70–99)

## 2011-08-26 LAB — TSH: TSH: 7.367 u[IU]/mL — ABNORMAL HIGH (ref 0.350–4.500)

## 2011-08-26 LAB — HEMOGLOBIN A1C: Hgb A1c MFr Bld: 6.6 % — ABNORMAL HIGH (ref <5.7)

## 2011-08-27 LAB — BASIC METABOLIC PANEL
BUN: 31 mg/dL — ABNORMAL HIGH (ref 6–23)
Calcium: 9.9 mg/dL (ref 8.4–10.5)
GFR calc non Af Amer: 25 mL/min — ABNORMAL LOW (ref >60)
Glucose, Bld: 159 mg/dL — ABNORMAL HIGH (ref 70–99)
Sodium: 137 mEq/L (ref 135–145)

## 2011-08-27 LAB — GLUCOSE, CAPILLARY: Glucose-Capillary: 209 mg/dL — ABNORMAL HIGH (ref 70–99)

## 2011-08-28 LAB — GLUCOSE, CAPILLARY
Glucose-Capillary: 187 mg/dL — ABNORMAL HIGH (ref 70–99)
Glucose-Capillary: 180 mg/dL — ABNORMAL HIGH (ref 70–99)
Glucose-Capillary: 249 mg/dL — ABNORMAL HIGH (ref 70–99)

## 2011-08-28 LAB — BASIC METABOLIC PANEL
CO2: 30 mEq/L (ref 19–32)
Chloride: 98 mEq/L (ref 96–112)
Sodium: 137 mEq/L (ref 135–145)

## 2011-08-29 NOTE — Discharge Summary (Addendum)
[name], [place]               ACCOUNT NO.:  [id number]  MEDICAL RECORD NO.:  [id number]  LOCATION:  3707                         FACILITY:  [facility]  PHYSICIAN:  Cassell Clement, M.D. DATE OF BIRTH:  11-20-1932  DATE OF ADMISSION:  08/25/2011 DATE OF DISCHARGE:  08/28/2011                              DISCHARGE SUMMARY   DISCHARGE DIAGNOSES: 1. Hypertensive urgency. 2. Chest pain secondary to hypertension.     a.     Negative cardiac enzymes.     b.     No further ischemic workup at this time. 3. Chronic diastolic heart failure.     a.     Hypertrophic cardiomyopathy by echo in April 2011.     b.     A 2-D echocardiogram on August 18, 2011 with ejection      fraction of 60-65% with grade 1 diastolic dysfunction, no mention      of hypertrophic cardiomyopathy.  Mild mitral regurgitation. 4. Coronary artery disease.     a.     Status post balloon angioplasty to the left anterior      descending in 2003.     b.     Status post Cypher drug-eluting stent placement to the mid      left anterior descending/obtuse marginal in October 2008.     c.     Stress testing in 2011 showing mild inferior ischemia, felt      to be low risk. 5. History of cerebrovascular accident. 6. Chronic renal insufficiency with baseline creatinine 1.7-2.2.     a.     Discharge creatinine 2.03. 7. Ventricular bigeminy corresponding with palpitations in August     2011. 8. Abnormal TSH, for outpatient recheck. 9. Vaginal bleeding with benign hysteroscopy in January 2012. 10.Diabetes mellitus.  SURGICAL HISTORY:  Appendectomy and right knee surgery.  HOSPITAL COURSE:  [name] is a 79-year lady with a history of CAD, diastolic heart and hypertension, who presented initially to [facility] for evaluation of chest pain in the setting of uncontrolled hypertension with systolic blood pressure of 230.  She has had elevated blood pressure for the past 1-2 weeks.  In the ER at [facility], she  was started on IV nitroglycerin drip and transferred to [facility] for further evaluation and management.  Labetalol was started in move of Lopressor and overnight her nitroglycerin was titrated for better blood pressure control.  Cardiac enzymes were cycled which were negative.  The admitting physician was hesitant to use systemic anticoagulation in the setting of severe systolic hypertension which was quite reasonable.  The patient's blood pressure medication was titrated.  A 2-D echocardiogram demonstrated EF of 60-65% with grade 1 diastolic dysfunction.  Please note that there was no mention of hypertrophic cardiomyopathy on this echocardiogram as previously discussed.  Dr. Antoine Poche did not feel that any further ischemic workup was necessary at this time.  By day of discharge, the patient's blood pressures improved to the 140 range.  Dr. Patty Sermons, has seen and examined her today and feel she is stable for discharge  DISCHARGE LABS:  Sodium 137, potassium 2.9, chloride 98, CO2 of 30, glucose 191, BUN 42, creatinine  2.03.  LFTs were normal on September 27 with the exception of total bilirubin 0.2.  A1c was 6.6.  Cardiac enzymes negative x3.  Initial BNP 2323.  TSH 7.367.  STUDIES: 1. Chest x-ray on August 25, 2011, showed stable chest radiograph.     Peribronchial thickening may reflect acute-on-chronic bronchitis     that is nonspecific. 2. A 2-D echocardiogram, please see above for discussion.  DISCHARGE MEDICATIONS: 1. Amlodipine 2.5 mg daily which is new. 2. Labetalol 300 mg t.i.d. which is new. 3. Lisinopril 10 mg daily which is new. 4. Aspirin 81 mg every morning. 5. Lasix 20 mg every morning. 6. Imdur 60 mg daily. 7. Nitro sublingual 0.4 mg every 5 minutes as needed up to 3 doses for     chest pain. 8. Novolin 70/30 25 units subcutaneously b.i.d.  DISPOSITION:  [name] will be discharged in stable condition to home.  She is to increase activity slowly  and follow a low-sodium heart- healthy diabetic diet.  She is to follow up with her family doctor in regards to her renal insufficiency, diabetes, and also to recheck her thyroid function which was incidentally noted to be mildly abnormal at discharge.  She is also going to follow up with Dr. Jenene Slicker office and our office will call her with this appointment.  DURATION OF DISCHARGE ENCOUNTER:  Greater than 30 minutes including physician and PA time.     Ronie Spies, P.A.C.   ______________________________ Cassell Clement, M.D.    DD/MEDQ  D:  08/28/2011  T:  08/28/2011  Job:  [id number]  cc:   Dr. Lenise Arena  Electronically Signed by Cassell Clement M.D. on 08/29/2011 09:26:16 AM Electronically Signed by Ronie Spies  on 09/05/2011 09:40:22 AM

## 2011-09-06 LAB — COMPREHENSIVE METABOLIC PANEL
Albumin: 3 — ABNORMAL LOW
Alkaline Phosphatase: 31 — ABNORMAL LOW
BUN: 26 — ABNORMAL HIGH
CO2: 27
Chloride: 106
GFR calc non Af Amer: 36 — ABNORMAL LOW
Glucose, Bld: 92
Potassium: 4.5
Total Bilirubin: 0.5

## 2011-09-06 LAB — CBC
HCT: 28.8 — ABNORMAL LOW
Hemoglobin: 9.7 — ABNORMAL LOW
MCHC: 34.8
MCHC: 34.9
Platelets: 169
RBC: 3.14 — ABNORMAL LOW
RDW: 13.4
WBC: 3.6 — ABNORMAL LOW

## 2011-09-06 LAB — BASIC METABOLIC PANEL
BUN: 27 — ABNORMAL HIGH
GFR calc non Af Amer: 34 — ABNORMAL LOW
Potassium: 4.3
Sodium: 137

## 2011-09-06 LAB — CARDIAC PANEL(CRET KIN+CKTOT+MB+TROPI)
CK, MB: 4.8 — ABNORMAL HIGH
Total CK: 53
Total CK: 53
Total CK: 69
Troponin I: 0.6

## 2011-09-07 LAB — CBC
Hemoglobin: 10.2 — ABNORMAL LOW
Hemoglobin: 9.7 — ABNORMAL LOW
Platelets: 183
RDW: 12.8
RDW: 13
WBC: 3.3 — ABNORMAL LOW

## 2011-09-07 LAB — COMPREHENSIVE METABOLIC PANEL
ALT: 11
Albumin: 3.3 — ABNORMAL LOW
Alkaline Phosphatase: 38 — ABNORMAL LOW
Glucose, Bld: 174 — ABNORMAL HIGH
Potassium: 4.6
Sodium: 137
Total Protein: 5.9 — ABNORMAL LOW

## 2011-09-07 LAB — BASIC METABOLIC PANEL
Calcium: 9.2
GFR calc Af Amer: 42 — ABNORMAL LOW
GFR calc non Af Amer: 34 — ABNORMAL LOW
Glucose, Bld: 189 — ABNORMAL HIGH
Sodium: 137

## 2011-09-07 LAB — BLOOD GAS, ARTERIAL
Bicarbonate: 26 — ABNORMAL HIGH
Patient temperature: 98.6
pH, Arterial: 7.36
pO2, Arterial: 126 — ABNORMAL HIGH

## 2011-09-07 LAB — DIFFERENTIAL
Basophils Relative: 1
Eosinophils Absolute: 0.1
Monocytes Absolute: 0.3
Monocytes Relative: 11

## 2011-09-07 LAB — APTT: aPTT: 33

## 2011-09-07 LAB — PROTIME-INR: INR: 1

## 2011-09-15 ENCOUNTER — Ambulatory Visit (INDEPENDENT_AMBULATORY_CARE_PROVIDER_SITE_OTHER): Payer: Medicare Other | Admitting: Cardiology

## 2011-09-15 ENCOUNTER — Encounter: Payer: Self-pay | Admitting: Cardiology

## 2011-09-15 VITALS — BP 171/63 | HR 59 | Ht 64.0 in | Wt 171.0 lb

## 2011-09-15 DIAGNOSIS — I2510 Atherosclerotic heart disease of native coronary artery without angina pectoris: Secondary | ICD-10-CM

## 2011-09-15 DIAGNOSIS — R002 Palpitations: Secondary | ICD-10-CM

## 2011-09-15 DIAGNOSIS — I10 Essential (primary) hypertension: Secondary | ICD-10-CM

## 2011-09-15 DIAGNOSIS — I5032 Chronic diastolic (congestive) heart failure: Secondary | ICD-10-CM

## 2011-09-15 DIAGNOSIS — I509 Heart failure, unspecified: Secondary | ICD-10-CM

## 2011-09-15 DIAGNOSIS — R011 Cardiac murmur, unspecified: Secondary | ICD-10-CM

## 2011-09-15 MED ORDER — AMLODIPINE BESYLATE 5 MG PO TABS: 5.0000 mg | ORAL_TABLET | Freq: Every day | ORAL | Status: DC

## 2011-09-15 NOTE — Assessment & Plan Note (Signed)
She had no sustained arrhythmias in the hospital.  No further monitoring is planned.  I will consider this if her symptoms worsen.

## 2011-09-15 NOTE — Assessment & Plan Note (Signed)
She seems to be euvolemic.  At this point, no change in therapy is indicated.  We have reviewed salt and fluid restrictions.  No further cardiovascular testing is indicated.   

## 2011-09-15 NOTE — Assessment & Plan Note (Signed)
She had a low risk stress perfusion study in 2011 and no new angina.  No change in therapy is indicated.   

## 2011-09-15 NOTE — Progress Notes (Signed)
HPI The patient presents for follow up of coronary artery disease, hypertrophic cardiomyopathy and palpitations.  She was in the hospital in late Sept. She had hypertensive urgency.  I reviewed the hospital records. She was supposed to go home on Norvasc 2.5 mg. Somehow this was changed to 10 mg post discharge but she has not been taking this at all because the higher dose in the past caused swelling. Her blood pressures have not been well controlled. She said her systolic will sometimes range 161 in the morning. She is feeling palpitations. She's not having any syncope though she will occasionally get lightheaded. She is not having any new chest pressure, neck or arm discomfort. She didn't fall couple of times and actually has injured her ankle with some swelling and bruising.   Allergies  Allergen Reactions  . Propoxyphene N-Acetaminophen   . Simvastatin Other (See Comments)    headache    Current Outpatient Prescriptions  Medication Sig Dispense Refill  . aspirin (LONGS ADULT LOW STRENGTH ASA) 81 MG EC tablet Take 81 mg by mouth daily.        . furosemide (LASIX) 20 MG tablet Take 20 mg by mouth every morning.       . insulin aspart protamine-insulin aspart (NOVOLOG 70/30) (70-30) 100 UNIT/ML injection Inject 30 Units into the skin 2 (two) times daily with a meal.        . insulin NPH-insulin regular (NOVOLIN 70/30) (70-30) 100 UNIT/ML injection Inject 30 Units into the skin 2 (two) times daily with a meal.        . isosorbide mononitrate (IMDUR) 60 MG 24 hr tablet Take 60 mg by mouth every morning.       . labetalol (NORMODYNE) 300 MG tablet Take 300 mg by mouth 3 (three) times daily.        Marland Kitchen lisinopril (PRINIVIL,ZESTRIL) 10 MG tablet Take 10 mg by mouth daily.        Bertram Gala Glycol-Propyl Glycol (SYSTANE OP) Apply 1 drop to eye daily.          Past Medical History  Diagnosis Date  . CAD (coronary artery disease) 2003  . CVA (cerebral vascular accident) 1996    LEFT SIDE   . IDDM  (insulin dependent diabetes mellitus)   . CKD (chronic kidney disease)   . Myocardial infarction   . Hx of CABG     2 stent placements  . Congestive heart failure   . Hypercholesteremia     Past Surgical History  Procedure Date  . Appendectomy   . Knee surgery     ROS:  As stated in the HPI and negative for all other systems.  PHYSICAL EXAM BP 171/63  Pulse 59  Ht 5\' 4"  (1.626 m)  Wt 171 lb (77.565 kg)  BMI 29.35 kg/m2 GENERAL:  Well appearing HEENT:  Pupils equal round and reactive, fundi not visualized, oral mucosa unremarkable, dentures NECK:  No jugular venous distention, waveform within normal limits, carotid upstroke brisk and symmetric, no bruits, positive bilateral transmitted systolic murmur, no thyromegaly LYMPHATICS:  No cervical, inguinal adenopathy LUNGS:  Clear to auscultation bilaterally BACK:  No CVA tenderness CHEST:  Unremarkable HEART:  PMI not displaced or sustained,S1 and S2 within normal limits, no S3, no S4, no clicks, no rubs, apical systolic murmurs ABD:  Flat, positive bowel sounds normal in frequency in pitch, no bruits, no rebound, no guarding, no midline pulsatile mass, no hepatomegaly, no splenomegaly EXT:  2 plus pulses throughout, no cyanosis  no , left ankle swelling and bruising SKIN:  No rashes no nodules NEURO:  Cranial nerves II through XII grossly intact, motor grossly intact throughout PSYCH:  Cognitively intact, oriented to person place and time  ASSESSMENT AND PLAN

## 2011-09-15 NOTE — Assessment & Plan Note (Signed)
I reviewed the last echo in the hospital last month.  I suspect the murmur is some septal hypertrophy though this was not mentioned.  No change in therapy is indicated at this point.

## 2011-09-15 NOTE — Patient Instructions (Signed)
Start Amlodipine 5 mg every night  Continue all other medications as listed  Follow up with Dr Antoine Poche in [place]

## 2011-09-15 NOTE — Assessment & Plan Note (Signed)
Her blood pressure is still elevated.  I will start Norvasc 5 mg qhs.  She will keep the BP diary.

## 2011-09-19 ENCOUNTER — Emergency Department (HOSPITAL_COMMUNITY): Payer: Medicare Other

## 2011-09-19 ENCOUNTER — Inpatient Hospital Stay (HOSPITAL_COMMUNITY)
Admission: EM | Admit: 2011-09-19 | Discharge: 2011-09-22 | DRG: 683 | Disposition: A | Discharge status: Home Health | Payer: Medicare Other | Attending: Internal Medicine | Admitting: Internal Medicine

## 2011-09-19 DIAGNOSIS — I69959 Hemiplegia and hemiparesis following unspecified cerebrovascular disease affecting unspecified side: Secondary | ICD-10-CM

## 2011-09-19 DIAGNOSIS — N189 Chronic kidney disease, unspecified: Secondary | ICD-10-CM | POA: Diagnosis present

## 2011-09-19 DIAGNOSIS — Z794 Long term (current) use of insulin: Secondary | ICD-10-CM

## 2011-09-19 DIAGNOSIS — I2510 Atherosclerotic heart disease of native coronary artery without angina pectoris: Secondary | ICD-10-CM | POA: Diagnosis present

## 2011-09-19 DIAGNOSIS — R5381 Other malaise: Secondary | ICD-10-CM | POA: Diagnosis present

## 2011-09-19 DIAGNOSIS — I129 Hypertensive chronic kidney disease with stage 1 through stage 4 chronic kidney disease, or unspecified chronic kidney disease: Principal | ICD-10-CM | POA: Diagnosis present

## 2011-09-19 DIAGNOSIS — D649 Anemia, unspecified: Secondary | ICD-10-CM | POA: Diagnosis present

## 2011-09-19 DIAGNOSIS — I5032 Chronic diastolic (congestive) heart failure: Secondary | ICD-10-CM | POA: Diagnosis present

## 2011-09-19 DIAGNOSIS — E119 Type 2 diabetes mellitus without complications: Secondary | ICD-10-CM | POA: Diagnosis present

## 2011-09-19 DIAGNOSIS — Z9181 History of falling: Secondary | ICD-10-CM

## 2011-09-19 LAB — CBC
HCT: 26.5 % — ABNORMAL LOW (ref 36.0–46.0)
Hemoglobin: 8.9 g/dL — ABNORMAL LOW (ref 12.0–15.0)
MCH: 30 pg (ref 26.0–34.0)
MCHC: 33.6 g/dL (ref 30.0–36.0)
MCV: 89.2 fL (ref 78.0–100.0)

## 2011-09-19 LAB — BASIC METABOLIC PANEL
BUN: 34 mg/dL — ABNORMAL HIGH (ref 6–23)
Calcium: 10.1 mg/dL (ref 8.4–10.5)
Creatinine, Ser: 2 mg/dL — ABNORMAL HIGH (ref 0.50–1.10)
GFR calc non Af Amer: 23 mL/min — ABNORMAL LOW (ref >90)
Glucose, Bld: 115 mg/dL — ABNORMAL HIGH (ref 70–99)
Sodium: 140 mEq/L (ref 135–145)

## 2011-09-19 LAB — URINALYSIS, ROUTINE W REFLEX MICROSCOPIC
Bilirubin Urine: NEGATIVE
Nitrite: NEGATIVE
Specific Gravity, Urine: 1.007 (ref 1.005–1.030)
Urobilinogen, UA: 0.2 mg/dL (ref 0.0–1.0)

## 2011-09-19 LAB — DIFFERENTIAL
Basophils Relative: 0 % (ref 0–1)
Lymphs Abs: 0.8 10*3/uL (ref 0.7–4.0)
Monocytes Absolute: 0.3 10*3/uL (ref 0.1–1.0)
Monocytes Relative: 11 % (ref 3–12)
Neutro Abs: 1.4 10*3/uL — ABNORMAL LOW (ref 1.7–7.7)

## 2011-09-19 LAB — URINE MICROSCOPIC-ADD ON

## 2011-09-20 DIAGNOSIS — I369 Nonrheumatic tricuspid valve disorder, unspecified: Secondary | ICD-10-CM

## 2011-09-20 LAB — CARDIAC PANEL(CRET KIN+CKTOT+MB+TROPI)
CK, MB: 2.8 ng/mL (ref 0.3–4.0)
CK, MB: 2.3 ng/mL (ref 0.3–4.0)
Relative Index: INVALID (ref 0.0–2.5)
Relative Index: INVALID (ref 0.0–2.5)
Relative Index: INVALID (ref 0.0–2.5)
Total CK: 46 U/L (ref 7–177)
Troponin I: <0.3 ng/mL (ref <0.30)
Troponin I: <0.3 ng/mL (ref <0.30)
Troponin I: <0.3 ng/mL (ref <0.30)

## 2011-09-20 LAB — GLUCOSE, CAPILLARY: Glucose-Capillary: 223 mg/dL — ABNORMAL HIGH (ref 70–99)

## 2011-09-20 LAB — HEMOGLOBIN AND HEMATOCRIT, BLOOD: HCT: 27.2 % — ABNORMAL LOW (ref 36.0–46.0)

## 2011-09-20 LAB — TSH: TSH: 5.134 u[IU]/mL — ABNORMAL HIGH (ref 0.350–4.500)

## 2011-09-20 LAB — TYPE AND SCREEN

## 2011-09-20 LAB — ABO/RH: ABO/RH(D): A POS

## 2011-09-20 LAB — IRON: Iron: 50 ug/dL (ref 42–135)

## 2011-09-20 LAB — FOLATE: Folate: >20 ng/mL

## 2011-09-20 NOTE — H&P (Signed)
NAMESHARONE, [name]               ACCOUNT NO.:  [id number]  MEDICAL RECORD NO.:  [id number]  LOCATION:  [facility]                         FACILITY:  [facility]  PHYSICIAN:  Tarry Kos, MD       DATE OF BIRTH:  [birth year]  DATE OF ADMISSION:  09/19/2011 DATE OF DISCHARGE:                             HISTORY & PHYSICAL   CHIEF COMPLAINT:  "Weak."  HPI:  [name] is a 77 year old female who comes to emergency department today because she has been feeling just generalized weakness for several  weeks.  She comes to the ED because of generalized weakness.  She denies any fevers, denies any nausea, vomiting, diarrhea. Denies any melanotic stools.  Denies any bright red blood per rectum. She denies any dysuria.  She just says she has just been feeling weak. She does have a history of anemia during her previous pregnancies.  Her orthostatics are normal here, but we are being asked to admit the patient.  She is found to be anemic with a hemoglobin of 8.9.  Her last hemoglobin here back in August 25, 2011.  Her hemoglobin was 11.4. Again, she denies any melanotic stools.  She denies any bright red blood per rectum.  She denies any GI bleeding.  She has not had any previous scoping.  REVIEW OF SYSTEMS:  Otherwise negative.  PAST MEDICAL HISTORY: 1. Chronic kidney disease. 2. Chronic diastolic heart failure. 3. Hypertensive urgency. 4. Coronary artery disease status post stenting. 5. History CVA in the past.  Baseline creatinine is around 2. 6. History of ventricular bigeminy. 7. History of vaginal bleeding in the past, which she currently     denies. 8. Diabetes. 9. Status post appendectomy. 10.Status post right knee surgery.  SOCIAL HISTORY:  She lives at home by herself.  She denies any drugs or any alcohol use.  HOME MEDICATIONS: Not completed in the ED yet.  PHYSICAL EXAMINATION:  VITAL SIGNS:  She is afebrile.  Vital signs stable.  Orthostatics normal in the ED. GENERAL:   She is alert and oriented x4.  No apparent distress, cooperative, and friendly. HEENT:  Extraocular movements intact.  Pupils equal and reactive to light.  Oropharynx clear.  Mucous membranes moist. NECK:  No JVD.  No carotid bruits. COR:  Regular rate and rhythm without murmurs, rubs, or gallops. CHEST:  Clear to auscultation bilaterally.  No rhonchi, wheeze, or rales.  ABDOMEN:  Soft, nontender, nondistended, positive bowel sounds. No hepatosplenomegaly. EXTREMITIES:  No clubbing, cyanosis, or edema. PSYCHIATRIC:  Normal affect. NEUROLOGIC:  No focal neurologic deficits.  Cranial nerves II through XII grossly intact. SKIN:  No rashes.  LABS:  Her hemoglobin is as stated above.  Her occult blood fecal is negative.  Cardiac enzymes negative.  BUN and creatinine are at her baseline at 34 and 2.  Electrolytes normal.  MCV is normal at 89.2. Urinalysis is negative.  Chest x-ray, some mild vascular congestion.  A 12-lead EKG with first-degree AV block, no acute changes.  ASSESSMENT AND PLAN:  This is a 77 year old female with weakness ofreally unclear etiology. 1. Generalized weakness.  I did not find a source of infection.  She  does have a new normocytic anemia with no obvious acute GI     bleeding.  I will serial her hemoglobin q.8 hours and check iron     studies, B12, folate, thyroid studies, and rule her out with serial     cardiac enzymes.  We will also obtain a 2D echo and obtain     continued telemetry monitoring to pick up any arrhythmias.  We will     continue to heme test her stool x3 at least and check orthostatics     again in the morning.  Depending on her iron studies, would     consider getting GI involved either inpatient or outpatient as     patient says she has not had any screening colonoscopy done before. 2. History of diastolic congestive heart failure, this is stable at     this time, but again we will serial her enzymes. 3. Mild dehydration in the setting of  chronic kidney disease.  We will     gently hydrate her overnight. 4. We will clarify her home medications and reconcile that hopefully     later on tonight.  Further recommendation pending over hospital course.          ______________________________ Tarry Kos, MD     RD/MEDQ  D:  09/19/2011  T:  09/20/2011  Job:  [id number]  Electronically Signed by Tarry Kos MD on 09/20/2011 03:29:38 AM

## 2011-09-21 LAB — GLUCOSE, CAPILLARY
Glucose-Capillary: 142 mg/dL — ABNORMAL HIGH (ref 70–99)
Glucose-Capillary: 112 mg/dL — ABNORMAL HIGH (ref 70–99)

## 2011-09-21 LAB — HEMOGLOBIN AND HEMATOCRIT, BLOOD
HCT: 30.6 % — ABNORMAL LOW (ref 36.0–46.0)
Hemoglobin: 9.8 g/dL — ABNORMAL LOW (ref 12.0–15.0)

## 2011-09-22 LAB — CBC
HCT: 25.8 % — ABNORMAL LOW (ref 36.0–46.0)
MCH: 29.4 pg (ref 26.0–34.0)
MCV: 89.3 fL (ref 78.0–100.0)
Platelets: 175 10*3/uL (ref 150–400)
RDW: 13.2 % (ref 11.5–15.5)

## 2011-09-22 LAB — BASIC METABOLIC PANEL
BUN: 31 mg/dL — ABNORMAL HIGH (ref 6–23)
Chloride: 106 mEq/L (ref 96–112)
Creatinine, Ser: 1.77 mg/dL — ABNORMAL HIGH (ref 0.50–1.10)
GFR calc Af Amer: 31 mL/min — ABNORMAL LOW (ref >90)
GFR calc non Af Amer: 27 mL/min — ABNORMAL LOW (ref >90)
Glucose, Bld: 102 mg/dL — ABNORMAL HIGH (ref 70–99)

## 2011-09-22 NOTE — H&P (Signed)
[name], [name]               ACCOUNT NO.:  [id number]  MEDICAL RECORD NO.:  [id number]  LOCATION:  2601                         FACILITY:  [facility]  PHYSICIAN:  Therisa Doyne, MD    DATE OF BIRTH:  05-May-1932  DATE OF ADMISSION:  08/25/2011 DATE OF DISCHARGE:                             HISTORY & PHYSICAL   ATTENDING PHYSICIAN:  Rollene Rotunda, MD, [facility]  REASON FOR ADMISSION:  Chest pain and hypertensive urgency.  HISTORY OF PRESENT ILLNESS:  This is a 77 year old white female with past medical history significant for hypertension, diastolic heart failure, and coronary artery disease status post PCI who presents for evaluation of chest pain in the setting of hypertensive urgency.  The patient reports that she has had chronic systolic hypertension in her systolics run in the 170s and they have been doing this for many months. More recently, she had her antihypertensive regimen adjusted.  She was having difficulties with palpitations and her diltiazem was discontinued and she was started on low dose Lopressor 25 mg b.i.d.  Also she was complaining of lower extremity edema and her Norvasc was discontinued, which improved her lower extremity edema.  However, with the discontinuation Norvasc and diltiazem.  Her blood pressures have been elevated in the past 1-2 weeks.  She does report a 1 week ago at night, she woke with chest pain that lasted 5 minutes that was nonradiating and not associated with any signs or symptoms.  Today, she had again 2 episodes of chest pain at rest.  She came to the emergency department, was noted be markedly hypertensive with systolic blood pressure 230/120. She was given IV nitroglycerin transferred to [facility] for further evaluation and management.  Currently, she is on IV nitroglycerin drip as well as IV heparin drip.  With respect to her symptoms, she does live at home independently and is able to do her activities of daily living  without any limitations.  She did not have the heart failure symptoms of PND, orthopnea or exertional shortness of breath.  She denies any tachy palpitations or syncope.  PAST MEDICAL HISTORY: 1. Coronary artery disease status post balloon angioplasty of the LAD     in 2003, and subsequent Cypher stent placement to the mid LAD in     the OM in 2008.  She had a stress test in 2011, which was nuclear     stress, which demonstrated borderline area of inducible ischemia in     the inferior wall.  This was medically treated.  She is chronic     diastolic heart failure with pseudonormalization pattern. 2. History of CVA. 3. Chronic renal insufficiency with a baseline creatinine of 1.7-2.2.  SOCIAL HISTORY:  The patient lives at home in [place].  She is widowed. She denies tobacco, alcohol or drug use.  Family history is notable for multiple family members have myocardial infarction.  REVIEW OF SYSTEMS:  All systems reviewed are negative as mentioned above in history of present illness.  HOME MEDICATIONS: 1. Aspirin 81 mg daily. 2. Imdur 60 mg daily. 3. Metoprolol 25 mg b.i.d. 4. Novolin 70/30 insulin 25 units b.i.d. 5. Sublingual nitroglycerin. 6. Lasix 20 mg daily.  ALLERGIES:  DARVOCET.  PHYSICAL EXAMINATION:  VITAL SIGNS: Temperature afebrile, blood pressure is currently 205/100, heart rate is 86, respirations 18, O2 saturation 100% on room air. GENERAL:  In no acute distress. HEENT: Normocephalic atraumatic.  Pupils equal, round, and reactive to light and accommodation.  Oropharynx pink and moist without lesions. NECK:  Supple with no lymphadenopathy.  No jugular venous distention or masses. CARDIOVASCULAR:  Regular rate and rhythm, 2/6 systolic ejection murmur heard at the base of the heart. LUNGS:  Clear to auscultation bilaterally. ABDOMEN:  Positive bowel sounds, soft and nondistended. EXTREMITIES:  No clubbing, cyanosis, trace lower extremity edema. SKIN:  No  rashes. BACK:  No CVA tenderness. NEUROLOGIC:  No focal deficits like normal affect.  PERTINENT DATA: 1. EKG demonstrates sinus rhythm at 76 beats per minute, left     ventricular hypertrophy with repolarization abnormalities Q-waves     in V1 and V2.  No acute signs of ischemia.  PERTINENT LABORATORY DATA:  Notable for hemoglobin 11.4, BUN 24, creatinine 1.8, glucose 236, troponin 0.06.  Liver functions within normal limits.  Chest x-ray showed no definite evidence of acute cardiopulmonary disease.  IMPRESSION AND PLAN:  [name] is a 77 year old white female with past medical history significant for coronary artery disease status post PCI, diastolic heart failure and hypertension who presents for evaluation of chest pain in the setting of uncontrolled hypertension with systolic blood pressures of 230. 1. We will admit the patient to [facility] to Dr. Jenene Slicker     care. 2. Chest pain and hypertensive urgency.  I suspect that her chest pain     is likely due to her uncontrolled systolic hypertension.  Her     systolic blood pressure was greater than 230 at the outside     hospital.  I suspect that her poorly controlled hypertension is     likely related to the recent medication changes that are made by     her primary care provider. She was recently discontinued on diltiazem  and Norvasc was started on Lopressor 25 mg b.i.d.  I would like to start the patient on labetalol and discontinue her Lopressor, we will start on 200 mg t.i.d. of labetalol.  We will also started on lisinopril 10 mg daily.  I will increase nitroglycerin to  50 mcg per minute this evening while we titrate up her blood pressure medications, certainly renal artery stenosis could account for some of her poorly controlled hypertension. We will check a renal artery duplex in the morning.  We will also check an echocardiogram to ensure the left ventricular function is normal and as her diastolic  filling parameters are additionally, she has a systolic ejection murmur which needs to be evaluated and echo will help with this.  If she rules out for myocardial infarction, I would recommend titrating up her blood pressure management considering performing an outpatient stress test.  We will discontinue her heparin at this time as this is unlikely to be in acute coronary syndrome and I am reluctant to use systemic anticoagulation in the setting of severe systolic hypertension. 1. Hypertension.  Please see above for management. 2. Diastolic heart failure.  We will check an echocardiogram, assess     her diastolic filling parameters.  She will continue on medical     therapy.  We will check a BNP. 3. Diabetes mellitus.  We will check hemoglobin A1c.  We will give her     10 units of Lantus in the evening  this     while she is here in the hospital and cover with a sliding scale     insulin. 4. Fluids, electrolytes, nutrition:  Saline lock IV fluids,     electrolytes are stable n.p.o. 5. Deep venous thrombosis prophylaxis.  Subcutaneous heparin use.     Therisa Doyne, MD     SJT/MEDQ  D:  08/25/2011  T:  08/25/2011  Job:  [id number]  Electronically Signed by Aldona Bar MD on 09/22/2011 06:10:34 AM

## 2011-09-23 NOTE — Discharge Summary (Signed)
[name], [name] NO.:  [id number]  MEDICAL RECORD NO.:  [id number]  LOCATION:  2038                         FACILITY:  [facility]  PHYSICIAN:  Isidor Holts, M.D.  DATE OF BIRTH:  October 04, 1932  DATE OF ADMISSION:  09/19/2011 DATE OF DISCHARGE:  09/22/2011                              DISCHARGE SUMMARY   PRIMARY MD:  Helene Kelp, PA-C, [place], [place]  PRIMARY CARDIOLOGIST:  Rollene Rotunda, MD, [facility]  DISCHARGE DIAGNOSES: 1. Generalized weakness, multifactorial. 2. Chronic kidney disease, baseline creatinine 1.7-2.2. 3. History of chronic diastolic congestive heart failure. 4. Hypertension. 5. History of coronary artery disease, status post stent to left     anterior descending coronar artery. 6. Status post previous cerebrovascular accident/right hemiparesis. 7. Type 2 diabetes mellitus, insulin-requiring. 8. Moderate normocytic anemia. 9. Subclinical hypothyroidism.  DISCHARGE MEDICATIONS: 1. Enteric-coated aspirin 81 mg p.o. daily. 2. Amlodipine 2.5 mg p.o. daily. 3. Furosemide 20 mg p.o. q.a.m. 4. Isosorbide mononitrate XR 60 mg p.o. daily. 5. Labetalol 300 mg p.o. t.i.d. 6. Nitroglycerin 0.4 mg sublingually p.r.n. every 5 minutes x3 doses     for chest pain. 7. NovoLog (70/30) 25 units subcutaneously b.i.d.  PROCEDURES: 1. Chest x-ray, September 19, 2011.  This showed vascular congestion,     diffusely increased interstitial markings, likely reflecting mild     interstitial edema. 2. 2-D echocardiogram, September 20, 2011.  This showed normal left     ventricular cavity size.  Wall thickness was normal.  Systolic     function was normal with estimated of EF 55%-60%.  There was     calcification of the noncoronary cusp of the aortic valve.  Left     atrium was mildly dilated.  There was no atrial septal defect or     patent foramen ovale.  CONSULTATIONS:  None.  Telephone discussion held with Dr. Virgil Benedict Gastroenterologist on  September 22, 2011.  ADMISSION HISTORY:  As in H and P notes of September 19, 2011, dictated by Dr. Tarry Kos.  However, in brief, this is a 77 year old female, with known history of chronic kidney disease, baseline creatinine 1.7-2.2; chronic diastolic congestive heart failure; hypertension; coronary artery disease, status post stent to LAD; history of previous CVA with residual mild right hemiparesis; insulin-requiring type 2 diabetes mellitus; history of previous right knee surgery; history of vaginal bleeding, status post benign hysteroscopy in January 2012, presenting with complaints of weakness, described as generalized, for several weeks. On initial evaluation, she was found to be anemic with a hemoglobin of 8.9.  She was admitted for further evaluation, investigation, and management.  CLINICAL COURSE: 1. Chronic kidney disease.  The patient's baseline creatinine is known     to be 1.7-2.2.  BUN at the time of presentation was 34 with a     creatinine of 2.2, i.e., at baseline.  Over the course of the     patient's hospitalization, her creatinine improved and as of     September 22, 2011, BUN was 31, creatinine 1.77.  2. History of chronic diastolic congestive heart failure.  The patient     had no complaints of shortness of breath at the time of  presentation, although she did admit to fatigue.  Her initial chest     x-ray of September 19, 2011 showed vascular congestion with diffusely     increased interstitial markings, likely reflecting mild     interstitial edema.  She had no chest pain.  Cardiac enzymes were     cycled and remained unelevated.  2-D echocardiogram was done on     September 20, 2011.  For details of findings, refer to procedure list     above, but this showed well-preserved LV function.  Initial BNP was     1552.  The patient was continued on preadmission dose of diuretics     and did not develop overt congestive heart failure, during this      hospitalization.  3. Hypertension.  This has required multiple antihypertensive     medications and certain amount of titration; however, as of September 22, 2011, BP was controlled at 132/52 mmHg.  The patient has been     continued on preadmission antihypertensive medications, including     labetalol, calcium channel antagonist as well as diuretics and     nitrates.  4. History of coronary artery disease.  The patient has remained     asymptomatic from this viewpoint.  5. Insulin-requiring type 2 diabetes mellitus.  The patient was     managed with scheduled insulin, carbohydrate-modified diet and     sliding-scale insulin diet during the course of this     hospitalization and remained euglycemic.  6. Anemia.  The patient presented with a normocytic anemia with     hemoglobin of 8.9 and MCV of 89.2.  Fecal occult blood testing was     negative.  It is not clear what the source of the patient's anemia     is.  However, she has never had a colonoscopy and it is felt that     she will benefit from endoscopic GI evaluation.  With this in mind,     I discussed with Dr. Herbert Moors, gastroenterologist, on September 22, 2011.  He has agreed to evaluate the patient on an outpatient     basis.  An appointment is scheduled for September 26, 2011.  7. Subclinical hypothyroidism.  The patient's TSH was found to be     mildly elevated at 5.134, free T4 was 1.10, and T3 was 94.8, i.e.,     biochemically, these findings are consistent with subclinical     hypothyroidism.  The patient will need continued followup with     regular thyroid profile checks periodically, under the care of the     patient's primary MD.  8. Deconditioning.  The patient was evaluated by PT/OT and found to be     somewhat deconditioned.  She was recommended short-term SNF for     rehabilitation; however, the patient has adamantly declined.     Therefore, she will undergo home health PT/OT, following discharge.  9.  Generalized weakness.  This is multifactorial, secondary to a     combination of factors, including chronic kidney disease,     subclinical hypothyroidism, and normocytic anemia, as well as a     certain amount of deconditioning.  Improvement is anticipated,     provided these are adequately addressed.  DISPOSITION:  The patient was considered clinically stable for discharge on September 22, 2011.  She was very keen to be discharged.  As described above, she has declined short-term SNF placement.  Therefore, she has been discharged to home.  ACTIVITY:  As tolerated.  Recommended to increase activity slowly; otherwise, per PT/OT.  DIET:  Heart healthy/carbohydrate modified.  FOLLOWUP INSTRUCTIONS:  The patient will follow up with her primary MD, Dr. Helene Kelp, [place], [facility] in the next 2-3 weeks.  She has a followup appointment scheduled for GI evaluation by Dr. Herbert Moors.  Appointment has been scheduled for September 26, 2011 at 10:15 a.m., telephone number [phone].  Details have been communicated to the patient and her family.  They verbalized understanding.  SPECIAL INSTRUCTIONS: 1. The patient's primary MD is recommended to follow up the patient's     thyroid profile in 6-8 weeks.  She may need to be considered for low-     dose thyroxine in due course. Follwing GI evaluation, patient may benefit     from iron supplementation, for anemia. 2. Home health PT/OT, RN, and rolling walker have been arranged, as     well as home health social worker.     Isidor Holts, M.D.     CO/MEDQ  D:  09/22/2011  T:  09/22/2011  Job:  [id number]  cc:   Helene Kelp, PA-C Rollene Rotunda, MD, [facility]  Electronically Signed by Isidor Holts M.D. on 09/23/2011 14:78:29 PM

## 2011-10-13 ENCOUNTER — Encounter: Payer: Self-pay | Admitting: Cardiology

## 2011-10-26 ENCOUNTER — Ambulatory Visit (INDEPENDENT_AMBULATORY_CARE_PROVIDER_SITE_OTHER): Payer: Medicare Other | Admitting: Cardiology

## 2011-10-26 ENCOUNTER — Encounter: Payer: Self-pay | Admitting: Cardiology

## 2011-10-26 VITALS — BP 213/70 | HR 67 | Ht 64.0 in | Wt 174.0 lb

## 2011-10-26 DIAGNOSIS — I10 Essential (primary) hypertension: Secondary | ICD-10-CM

## 2011-10-26 DIAGNOSIS — N189 Chronic kidney disease, unspecified: Secondary | ICD-10-CM

## 2011-10-26 DIAGNOSIS — R011 Cardiac murmur, unspecified: Secondary | ICD-10-CM

## 2011-10-26 MED ORDER — HYDRALAZINE HCL 10 MG PO TABS: 10.0000 mg | ORAL_TABLET | Freq: Three times a day (TID) | ORAL | Status: DC

## 2011-10-26 NOTE — Assessment & Plan Note (Addendum)
I reviewed the last echo in the hospital recently.  I suspect the murmur is some septal hypertrophy though this was not mentioned.  No change in therapy is indicated at this point.

## 2011-10-26 NOTE — Progress Notes (Signed)
HPI The patient presents for follow up of coronary artery disease and HTN.  She was in the hospital in late Sept. She had hypertensive urgency.  Her blood pressures are still elevated.  She is not taking ACE inhibitor which she was on previously because of renal insufficiency. She hasn't tolerated even 5 mg of amlodipine because of lower extremity swelling area and her blood pressures remain high and occasionally she takes her labetalol 4 times daily. She actually feels well. She denies any chest pressure, neck or arm discomfort. She denies any new shortness of breath, PND or orthopnea. She's had a little rash breaking out on her lower legs. She's had very mild lower extremity edema.  Allergies  Allergen Reactions  . Propoxyphene N-Acetaminophen   . Simvastatin Other (See Comments)    headache    Current Outpatient Prescriptions  Medication Sig Dispense Refill  . aspirin (LONGS ADULT LOW STRENGTH ASA) 81 MG EC tablet Take 81 mg by mouth daily.        . ferrous sulfate 325 (65 FE) MG tablet Take 325 mg by mouth daily with breakfast.        . furosemide (LASIX) 20 MG tablet Take 20 mg by mouth every morning.       . insulin aspart protamine-insulin aspart (NOVOLOG 70/30) (70-30) 100 UNIT/ML injection Inject 30 Units into the skin 2 (two) times daily with a meal.        . insulin NPH-insulin regular (NOVOLIN 70/30) (70-30) 100 UNIT/ML injection Inject 30 Units into the skin 2 (two) times daily with a meal.        . isosorbide mononitrate (IMDUR) 60 MG 24 hr tablet Take 60 mg by mouth every morning.       . labetalol (NORMODYNE) 300 MG tablet Take 300 mg by mouth 3 (three) times daily.        Bertram Gala Glycol-Propyl Glycol (SYSTANE OP) Apply 1 drop to eye daily.          Past Medical History  Diagnosis Date  . CAD (coronary artery disease) 2003  . CVA (cerebral vascular accident) 1996    LEFT SIDE   . IDDM (insulin dependent diabetes mellitus)   . CKD (chronic kidney disease)   .  Myocardial infarction   . Hx of CABG     2 stent placements  . Congestive heart failure   . Hypercholesteremia     Past Surgical History  Procedure Date  . Appendectomy   . Knee surgery     ROS:  As stated in the HPI and negative for all other systems.  PHYSICAL EXAM BP 213/70  Pulse 67  Ht 5\' 4"  (1.626 m)  Wt 174 lb (78.926 kg)  BMI 29.87 kg/m2 GENERAL:  Well appearing HEENT:  Pupils equal round and reactive, fundi not visualized, oral mucosa unremarkable, dentures NECK:  No jugular venous distention, waveform within normal limits, carotid upstroke brisk and symmetric, no bruits, positive bilateral transmitted systolic murmur, no thyromegaly LYMPHATICS:  No cervical, inguinal adenopathy LUNGS:  Clear to auscultation bilaterally BACK:  No CVA tenderness CHEST:  Unremarkable HEART:  PMI not displaced or sustained,S1 and S2 within normal limits, no S3, no S4, no clicks, no rubs, apical systolic murmurs, increases with the strain phase of valsava. ABD:  Flat, positive bowel sounds normal in frequency in pitch, no bruits, no rebound, no guarding, no midline pulsatile mass, no hepatomegaly, no splenomegaly EXT:  2 plus pulses throughout, no cyanosis no , mild left  greater than right ankle swelling.  Varicose veins SKIN:  No rashes no nodules NEURO:  Cranial nerves II through XII grossly intact, motor grossly intact throughout PSYCH:  Cognitively intact, oriented to person place and time  ASSESSMENT AND PLAN

## 2011-10-26 NOTE — Assessment & Plan Note (Signed)
I will check the ultrasound.  She is to schedule an appt with nephrology.

## 2011-10-26 NOTE — Assessment & Plan Note (Signed)
She seems to be euvolemic.  At this point, no change in therapy is indicated.  We have reviewed salt and fluid restrictions.  No further cardiovascular testing is indicated.   

## 2011-10-26 NOTE — Assessment & Plan Note (Signed)
Her blood pressure is not well controlled. Of note she has been referred for nephrology appointment with her renal insufficiency. She will continue to keep her blood pressure diary.

## 2011-10-26 NOTE — Assessment & Plan Note (Signed)
She had a low risk stress perfusion study in 2011 and no new angina.  No change in therapy is indicated.

## 2011-10-26 NOTE — Patient Instructions (Addendum)
Please start Hydralazine 10 mg three times a day Continue all other medications as listed  Your physician has requested that you have a renal artery duplex. During this test, an ultrasound is used to evaluate blood flow to the kidneys. Allow one hour for this exam. Do not eat after midnight the day before and avoid carbonated beverages. Take your medications as you usually do.  Follow up with Dr Antoine Poche in [place] in 1 month

## 2011-11-09 ENCOUNTER — Encounter: Payer: Medicare Other | Admitting: *Deleted

## 2011-12-01 ENCOUNTER — Encounter (INDEPENDENT_AMBULATORY_CARE_PROVIDER_SITE_OTHER): Payer: Medicare Other | Admitting: *Deleted

## 2011-12-01 DIAGNOSIS — N189 Chronic kidney disease, unspecified: Secondary | ICD-10-CM

## 2011-12-01 DIAGNOSIS — I10 Essential (primary) hypertension: Secondary | ICD-10-CM

## 2011-12-07 ENCOUNTER — Encounter: Payer: Self-pay | Admitting: Cardiology

## 2011-12-07 ENCOUNTER — Ambulatory Visit (INDEPENDENT_AMBULATORY_CARE_PROVIDER_SITE_OTHER): Payer: Medicare Other | Admitting: Cardiology

## 2011-12-07 VITALS — BP 164/70 | HR 71 | Ht 64.0 in | Wt 164.0 lb

## 2011-12-07 DIAGNOSIS — I509 Heart failure, unspecified: Secondary | ICD-10-CM

## 2011-12-07 DIAGNOSIS — I5032 Chronic diastolic (congestive) heart failure: Secondary | ICD-10-CM

## 2011-12-07 DIAGNOSIS — N189 Chronic kidney disease, unspecified: Secondary | ICD-10-CM

## 2011-12-07 DIAGNOSIS — R002 Palpitations: Secondary | ICD-10-CM

## 2011-12-07 DIAGNOSIS — I10 Essential (primary) hypertension: Secondary | ICD-10-CM

## 2011-12-07 DIAGNOSIS — R011 Cardiac murmur, unspecified: Secondary | ICD-10-CM

## 2011-12-07 DIAGNOSIS — I2510 Atherosclerotic heart disease of native coronary artery without angina pectoris: Secondary | ICD-10-CM

## 2011-12-07 MED ORDER — HYDRALAZINE HCL 10 MG PO TABS: 10.0000 mg | ORAL_TABLET | Freq: Three times a day (TID) | ORAL | Status: DC

## 2011-12-07 NOTE — Patient Instructions (Signed)
Restart Hydralazine 10 mg three times a day. Continue all other medications as listed.  Follow up in 6 months with Dr Antoine Poche.  You will receive a letter in the mail 2 months before you are due.  Please call us when you receive this letter to schedule your follow up appointment.

## 2011-12-07 NOTE — Assessment & Plan Note (Signed)
I will reschedule her appointment with nephrology today. Her last creatinine done the other day was 2.5 I have reviewed these labs from her primary care office.

## 2011-12-07 NOTE — Assessment & Plan Note (Signed)
She seems to be euvolemic. She will continue the meds as listed. 

## 2011-12-07 NOTE — Progress Notes (Signed)
HPI The patient presents for follow up of coronary artery disease and HTN.  She was in the hospital in late Sept. She had hypertensive urgency.  Her blood pressures are still elevated.  Of note she has not been taking hydralazine recently on her own. She thought it was running her blood pressure up area she did have renal ultrasound recently which was normal. She was supposed to see a nephrologist but there was some confusion and this appointment and she has not done this. She has fatigue area did she has increased sleep. However, she's not having any chest pressure, neck or arm discomfort. She's not having any palpitations, presyncope or syncope. She has had no PND or orthopnea.  Allergies  Allergen Reactions  . Propoxyphene N-Acetaminophen   . Simvastatin Other (See Comments)    headache    Current Outpatient Prescriptions  Medication Sig Dispense Refill  . aspirin (LONGS ADULT LOW STRENGTH ASA) 81 MG EC tablet Take 81 mg by mouth daily.        . B Complex-C-Min-Fe-FA (HEMATINIC PLUS VIT/MINERALS PO) Take by mouth. 1 tab daily      . furosemide (LASIX) 20 MG tablet Take 20 mg by mouth every morning.       . insulin aspart protamine-insulin aspart (NOVOLOG 70/30) (70-30) 100 UNIT/ML injection Inject 30 Units into the skin 2 (two) times daily with a meal.        . isosorbide mononitrate (IMDUR) 60 MG 24 hr tablet Take 60 mg by mouth every morning.       . labetalol (NORMODYNE) 300 MG tablet Take 300 mg by mouth 3 (three) times daily.        Bertram Gala Glycol-Propyl Glycol (SYSTANE OP) Apply 1 drop to eye daily.        . hydrALAZINE (APRESOLINE) 10 MG tablet Take 1 tablet (10 mg total) by mouth 3 (three) times daily.  90 tablet  6    Past Medical History  Diagnosis Date  . CAD (coronary artery disease) 2003    Last catheterization 2008. Two-vessel PCI of the first OM branch and mid LAD.  Marland Kitchen CVA (cerebral vascular accident) 1996    LEFT SIDE   . IDDM (insulin dependent diabetes  mellitus)   . CKD (chronic kidney disease)   . Myocardial infarction   . Congestive heart failure   . Hypercholesteremia     Past Surgical History  Procedure Date  . Appendectomy   . Knee surgery     ROS:  As stated in the HPI and negative for all other systems.  PHYSICAL EXAM BP 164/70  Pulse 71  Ht 5\' 4"  (1.626 m)  Wt 164 lb (74.39 kg)  BMI 28.15 kg/m2 GENERAL:  Well appearing HEENT:  Pupils equal round and reactive, fundi not visualized, oral mucosa unremarkable, dentures NECK:  No jugular venous distention, waveform within normal limits, carotid upstroke brisk and symmetric, no bruits, positive bilateral transmitted systolic murmur, no thyromegaly LYMPHATICS:  No cervical, inguinal adenopathy LUNGS:  Clear to auscultation bilaterally BACK:  No CVA tenderness CHEST:  Unremarkable HEART:  PMI not displaced or sustained,S1 and S2 within normal limits, no S3, no S4, no clicks, no rubs, apical systolic murmurs, increases with the strain phase of valsava. ABD:  Flat, positive bowel sounds normal in frequency in pitch, no bruits, no rebound, no guarding, no midline pulsatile mass, no hepatomegaly, no splenomegaly EXT:  2 plus pulses throughout, no cyanosis no , mild left greater than right ankle swelling.  Varicose veins SKIN:  No rashes no nodules NEURO:  Cranial nerves II through XII grossly intact, motor grossly intact throughout PSYCH:  Cognitively intact, oriented to person place and time  ASSESSMENT AND PLAN

## 2011-12-07 NOTE — Assessment & Plan Note (Signed)
The patient has no new sypmtoms.  No further cardiovascular testing is indicated.  We will continue with aggressive risk reduction and meds as listed.  

## 2011-12-07 NOTE — Assessment & Plan Note (Signed)
She's not having any problem with these. No further evaluation is necessary.

## 2011-12-07 NOTE — Assessment & Plan Note (Signed)
She has some calcification of her aortic valve without significant stenosis. No further imaging is indicated. I reviewed her most recent echo.

## 2011-12-07 NOTE — Assessment & Plan Note (Signed)
Her blood pressure is still not at target. She needs to resume her hydralazine. Once she is back on this I can judge whether she needs further meds titration.

## 2012-01-11 ENCOUNTER — Encounter: Payer: Self-pay | Admitting: Cardiology

## 2012-02-15 ENCOUNTER — Telehealth: Payer: Self-pay | Admitting: Cardiology

## 2012-02-15 NOTE — Telephone Encounter (Signed)
Recvd call from Dr. Kristian Covey office requesting copy of Renal US performed.  Faxed to [phone]

## 2012-02-29 ENCOUNTER — Telehealth: Payer: Self-pay | Admitting: Nephrology

## 2012-02-29 NOTE — Telephone Encounter (Signed)
02/29/12 spoke with patient regarding a referral to our office, [facility] from Dr.Befekadu for evaluation of RAS. Patient refused to schedule an appointment and stated she"feels great" and would not be able to come due to not having money or transportation. I notified Stacy with Dr.Befekadu's office of this conversation with patient and informed her that their office would need to refer her again if necessary. She understood and will inform the referring doctor. Jacklyn Shell

## 2012-10-21 ENCOUNTER — Encounter (HOSPITAL_COMMUNITY): Payer: Self-pay | Admitting: Emergency Medicine

## 2012-10-21 ENCOUNTER — Emergency Department (HOSPITAL_COMMUNITY): Payer: Medicare Other

## 2012-10-21 ENCOUNTER — Inpatient Hospital Stay (HOSPITAL_COMMUNITY)
Admission: EM | Admit: 2012-10-21 | Discharge: 2012-10-31 | DRG: 302 | Disposition: A | Discharge status: Home / Self Care | Payer: Medicare Other | Attending: Internal Medicine | Admitting: Internal Medicine

## 2012-10-21 DIAGNOSIS — D649 Anemia, unspecified: Secondary | ICD-10-CM

## 2012-10-21 DIAGNOSIS — E782 Mixed hyperlipidemia: Secondary | ICD-10-CM | POA: Diagnosis present

## 2012-10-21 DIAGNOSIS — I259 Chronic ischemic heart disease, unspecified: Secondary | ICD-10-CM | POA: Diagnosis present

## 2012-10-21 DIAGNOSIS — R002 Palpitations: Secondary | ICD-10-CM | POA: Diagnosis present

## 2012-10-21 DIAGNOSIS — I472 Ventricular tachycardia, unspecified: Secondary | ICD-10-CM | POA: Diagnosis not present

## 2012-10-21 DIAGNOSIS — E785 Hyperlipidemia, unspecified: Secondary | ICD-10-CM

## 2012-10-21 DIAGNOSIS — N189 Chronic kidney disease, unspecified: Secondary | ICD-10-CM

## 2012-10-21 DIAGNOSIS — Z794 Long term (current) use of insulin: Secondary | ICD-10-CM

## 2012-10-21 DIAGNOSIS — E781 Pure hyperglyceridemia: Secondary | ICD-10-CM | POA: Diagnosis present

## 2012-10-21 DIAGNOSIS — R011 Cardiac murmur, unspecified: Secondary | ICD-10-CM

## 2012-10-21 DIAGNOSIS — N039 Chronic nephritic syndrome with unspecified morphologic changes: Secondary | ICD-10-CM | POA: Diagnosis present

## 2012-10-21 DIAGNOSIS — I5032 Chronic diastolic (congestive) heart failure: Secondary | ICD-10-CM

## 2012-10-21 DIAGNOSIS — Z9089 Acquired absence of other organs: Secondary | ICD-10-CM

## 2012-10-21 DIAGNOSIS — M1990 Unspecified osteoarthritis, unspecified site: Secondary | ICD-10-CM | POA: Diagnosis present

## 2012-10-21 DIAGNOSIS — Z7982 Long term (current) use of aspirin: Secondary | ICD-10-CM

## 2012-10-21 DIAGNOSIS — D638 Anemia in other chronic diseases classified elsewhere: Secondary | ICD-10-CM | POA: Diagnosis present

## 2012-10-21 DIAGNOSIS — I4729 Other ventricular tachycardia: Secondary | ICD-10-CM | POA: Diagnosis not present

## 2012-10-21 DIAGNOSIS — E162 Hypoglycemia, unspecified: Secondary | ICD-10-CM

## 2012-10-21 DIAGNOSIS — Z9861 Coronary angioplasty status: Secondary | ICD-10-CM

## 2012-10-21 DIAGNOSIS — I200 Unstable angina: Secondary | ICD-10-CM | POA: Diagnosis present

## 2012-10-21 DIAGNOSIS — I2510 Atherosclerotic heart disease of native coronary artery without angina pectoris: Principal | ICD-10-CM | POA: Diagnosis present

## 2012-10-21 DIAGNOSIS — R079 Chest pain, unspecified: Secondary | ICD-10-CM

## 2012-10-21 DIAGNOSIS — N183 Chronic kidney disease, stage 3 unspecified: Secondary | ICD-10-CM | POA: Diagnosis present

## 2012-10-21 DIAGNOSIS — I679 Cerebrovascular disease, unspecified: Secondary | ICD-10-CM | POA: Diagnosis present

## 2012-10-21 DIAGNOSIS — I130 Hypertensive heart and chronic kidney disease with heart failure and stage 1 through stage 4 chronic kidney disease, or unspecified chronic kidney disease: Secondary | ICD-10-CM | POA: Diagnosis present

## 2012-10-21 DIAGNOSIS — D631 Anemia in chronic kidney disease: Secondary | ICD-10-CM | POA: Diagnosis present

## 2012-10-21 DIAGNOSIS — Z8673 Personal history of transient ischemic attack (TIA), and cerebral infarction without residual deficits: Secondary | ICD-10-CM

## 2012-10-21 DIAGNOSIS — I252 Old myocardial infarction: Secondary | ICD-10-CM

## 2012-10-21 DIAGNOSIS — I5033 Acute on chronic diastolic (congestive) heart failure: Secondary | ICD-10-CM | POA: Diagnosis present

## 2012-10-21 DIAGNOSIS — Z888 Allergy status to other drugs, medicaments and biological substances status: Secondary | ICD-10-CM

## 2012-10-21 DIAGNOSIS — N184 Chronic kidney disease, stage 4 (severe): Secondary | ICD-10-CM | POA: Diagnosis present

## 2012-10-21 DIAGNOSIS — Z8249 Family history of ischemic heart disease and other diseases of the circulatory system: Secondary | ICD-10-CM

## 2012-10-21 DIAGNOSIS — E119 Type 2 diabetes mellitus without complications: Secondary | ICD-10-CM | POA: Diagnosis present

## 2012-10-21 DIAGNOSIS — I10 Essential (primary) hypertension: Secondary | ICD-10-CM

## 2012-10-21 DIAGNOSIS — I5031 Acute diastolic (congestive) heart failure: Secondary | ICD-10-CM

## 2012-10-21 DIAGNOSIS — N179 Acute kidney failure, unspecified: Secondary | ICD-10-CM

## 2012-10-21 DIAGNOSIS — I359 Nonrheumatic aortic valve disorder, unspecified: Secondary | ICD-10-CM | POA: Diagnosis present

## 2012-10-21 DIAGNOSIS — Z79899 Other long term (current) drug therapy: Secondary | ICD-10-CM

## 2012-10-21 DIAGNOSIS — R0989 Other specified symptoms and signs involving the circulatory and respiratory systems: Secondary | ICD-10-CM | POA: Diagnosis present

## 2012-10-21 DIAGNOSIS — I509 Heart failure, unspecified: Secondary | ICD-10-CM | POA: Diagnosis present

## 2012-10-21 LAB — CBC WITH DIFFERENTIAL/PLATELET
Eosinophils Absolute: 0.1 10*3/uL (ref 0.0–0.7)
Lymphs Abs: 1.4 10*3/uL (ref 0.7–4.0)
MCH: 30.4 pg (ref 26.0–34.0)
Neutro Abs: 3.2 10*3/uL (ref 1.7–7.7)
Neutrophils Relative %: 63 % (ref 43–77)
Platelets: 217 10*3/uL (ref 150–400)
RBC: 3.95 MIL/uL (ref 3.87–5.11)
WBC: 5.1 10*3/uL (ref 4.0–10.5)

## 2012-10-21 LAB — GLUCOSE, CAPILLARY: Glucose-Capillary: 148 mg/dL — ABNORMAL HIGH (ref 70–99)

## 2012-10-21 LAB — BASIC METABOLIC PANEL
Calcium: 10.3 mg/dL (ref 8.4–10.5)
GFR calc Af Amer: 28 mL/min — ABNORMAL LOW (ref >90)
GFR calc non Af Amer: 24 mL/min — ABNORMAL LOW (ref >90)
Glucose, Bld: 172 mg/dL — ABNORMAL HIGH (ref 70–99)
Sodium: 138 mEq/L (ref 135–145)

## 2012-10-21 LAB — TROPONIN I: Troponin I: <0.3 ng/mL (ref <0.30)

## 2012-10-21 MED ORDER — ENOXAPARIN SODIUM 40 MG/0.4ML ~~LOC~~ SOLN
40.0000 mg | SUBCUTANEOUS | Status: DC
  Administered 2012-10-22 (×2): 40 mg via SUBCUTANEOUS
  Filled 2012-10-21 (×2): qty 0.4

## 2012-10-21 MED ORDER — ASPIRIN EC 81 MG PO TBEC
81.0000 mg | DELAYED_RELEASE_TABLET | Freq: Every day | ORAL | Status: DC
  Administered 2012-10-22 – 2012-10-31 (×10): 81 mg via ORAL
  Filled 2012-10-21 (×12): qty 1

## 2012-10-21 MED ORDER — ACETAMINOPHEN 325 MG PO TABS
650.0000 mg | ORAL_TABLET | ORAL | Status: DC | PRN
  Administered 2012-10-26: 650 mg via ORAL
  Filled 2012-10-21: qty 2

## 2012-10-21 MED ORDER — INSULIN ASPART 100 UNIT/ML ~~LOC~~ SOLN: 0.0000 [IU] | Freq: Every day | SUBCUTANEOUS | Status: DC

## 2012-10-21 MED ORDER — SODIUM CHLORIDE 0.9 % IJ SOLN
3.0000 mL | Freq: Two times a day (BID) | INTRAMUSCULAR | Status: DC
  Administered 2012-10-22 – 2012-10-30 (×14): 3 mL via INTRAVENOUS
  Filled 2012-10-21: qty 3

## 2012-10-21 MED ORDER — ASPIRIN 81 MG PO CHEW
324.0000 mg | CHEWABLE_TABLET | ORAL | Status: AC
  Filled 2012-10-21: qty 4
  Filled 2012-10-21: qty 3
  Filled 2012-10-21: qty 1

## 2012-10-21 MED ORDER — SODIUM CHLORIDE 0.9 % IJ SOLN
3.0000 mL | INTRAMUSCULAR | Status: DC | PRN
  Administered 2012-10-23 – 2012-10-26 (×2): 3 mL via INTRAVENOUS

## 2012-10-21 MED ORDER — FUROSEMIDE 20 MG PO TABS
20.0000 mg | ORAL_TABLET | ORAL | Status: DC
  Administered 2012-10-22: 20 mg via ORAL
  Filled 2012-10-21: qty 1

## 2012-10-21 MED ORDER — INSULIN NPH (HUMAN) (ISOPHANE) 100 UNIT/ML ~~LOC~~ SUSP: 20.0000 [IU] | Freq: Two times a day (BID) | SUBCUTANEOUS | Status: DC

## 2012-10-21 MED ORDER — LABETALOL HCL 200 MG PO TABS
300.0000 mg | ORAL_TABLET | Freq: Every day | ORAL | Status: DC
  Administered 2012-10-22: 300 mg via ORAL
  Filled 2012-10-21: qty 1
  Filled 2012-10-21: qty 2
  Filled 2012-10-21: qty 1

## 2012-10-21 MED ORDER — INSULIN ASPART 100 UNIT/ML ~~LOC~~ SOLN
0.0000 [IU] | Freq: Three times a day (TID) | SUBCUTANEOUS | Status: DC
  Administered 2012-10-22: 5 [IU] via SUBCUTANEOUS
  Administered 2012-10-23 (×2): 2 [IU] via SUBCUTANEOUS
  Administered 2012-10-24 – 2012-10-25 (×4): 3 [IU] via SUBCUTANEOUS
  Administered 2012-10-27: 2 [IU] via SUBCUTANEOUS
  Administered 2012-10-28: 3 [IU] via SUBCUTANEOUS
  Administered 2012-10-28 – 2012-10-29 (×2): 2 [IU] via SUBCUTANEOUS
  Administered 2012-10-30: 3 [IU] via SUBCUTANEOUS
  Administered 2012-10-30 – 2012-10-31 (×2): 2 [IU] via SUBCUTANEOUS

## 2012-10-21 MED ORDER — NITROGLYCERIN 0.4 MG SL SUBL
0.4000 mg | SUBLINGUAL_TABLET | SUBLINGUAL | Status: DC | PRN
  Administered 2012-10-26: 0.4 mg via SUBLINGUAL
  Filled 2012-10-21: qty 25

## 2012-10-21 MED ORDER — PANTOPRAZOLE SODIUM 40 MG PO TBEC
40.0000 mg | DELAYED_RELEASE_TABLET | Freq: Every day | ORAL | Status: DC
  Administered 2012-10-22 – 2012-10-31 (×11): 40 mg via ORAL
  Filled 2012-10-21 (×11): qty 1

## 2012-10-21 MED ORDER — NIFEDIPINE ER OSMOTIC RELEASE 30 MG PO TB24
60.0000 mg | ORAL_TABLET | Freq: Every day | ORAL | Status: DC
  Administered 2012-10-22: 60 mg via ORAL
  Filled 2012-10-21: qty 2

## 2012-10-21 MED ORDER — GI COCKTAIL ~~LOC~~: 30.0000 mL | Freq: Two times a day (BID) | ORAL | Status: DC | PRN

## 2012-10-21 MED ORDER — ISOSORBIDE MONONITRATE ER 60 MG PO TB24
60.0000 mg | ORAL_TABLET | Freq: Every day | ORAL | Status: DC
  Administered 2012-10-22 – 2012-10-26 (×5): 60 mg via ORAL
  Filled 2012-10-21 (×7): qty 1

## 2012-10-21 MED ORDER — ONDANSETRON HCL 4 MG/2ML IJ SOLN: 4.0000 mg | Freq: Four times a day (QID) | INTRAMUSCULAR | Status: DC | PRN

## 2012-10-21 MED ORDER — SODIUM CHLORIDE 0.9 % IV SOLN: 250.0000 mL | INTRAVENOUS | Status: DC | PRN

## 2012-10-21 MED ORDER — ASPIRIN 300 MG RE SUPP
300.0000 mg | RECTAL | Status: AC
  Administered 2012-10-22: 300 mg via RECTAL
  Filled 2012-10-21: qty 1

## 2012-10-21 NOTE — ED Notes (Signed)
Patient c/o central chest pain that does not radiate.  Describes pain as burning with no radiation.  Denies shortness of breath or vomiting, but states does have nausea.

## 2012-10-21 NOTE — ED Provider Notes (Addendum)
History     CSN: [id number]  Arrival date & time 10/21/12  1931   First MD Initiated Contact with Patient 10/21/12 2015      Chief Complaint  Patient presents with  . Chest Pain    (Consider location/radiation/quality/duration/timing/severity/associated sxs/prior treatment) HPI Complains of chest pain burning in nature moderate to severe ,onset tonight patient had 3 episodes each lasting 15 minutes, pain anterior nonradiating each episode was treated with one sublingual nitroglycerin with complete relief. Patient also treated herself with aspirin this morning, 1 baby aspirin and one adult dose aspirin. She is presently asymptomatic. No shortness of breath. Admits to nausea, no sweatiness no other associated symptoms. Brought via EMS Past Medical History  Diagnosis Date  . CAD (coronary artery disease) 2003    Last catheterization 2008. Two-vessel PCI of the first OM branch and mid LAD.  Marland Kitchen CVA (cerebral vascular accident) 1996    LEFT SIDE   . IDDM (insulin dependent diabetes mellitus)   . CKD (chronic kidney disease)   . Myocardial infarction   . Congestive heart failure   . Hypercholesteremia     Past Surgical History  Procedure Date  . Appendectomy   . Knee surgery     Family History  Problem Relation Age of Onset  . Diabetes Brother     RETINOPATHY   . Drug abuse Brother   . Liver cancer Brother   . Coronary artery disease Mother   . Breast cancer Daughter   . Heart attack Mother   . Heart attack Father   . Coronary artery disease Father   . Diabetes Sister     History  Substance Use Topics  . Smoking status: Never Smoker   . Smokeless tobacco: Not on file  . Alcohol Use: No    OB History    Grav Para Term Preterm Abortions TAB SAB Ect Mult Living                  Review of Systems  Constitutional: Negative.   HENT: Negative.   Respiratory: Negative.   Cardiovascular: Positive for chest pain.  Gastrointestinal: Positive for nausea.    Musculoskeletal: Negative.   Skin: Negative.   Neurological: Negative.   Hematological: Negative.   Psychiatric/Behavioral: Negative.   All other systems reviewed and are negative.    Allergies  Propoxyphene-acetaminophen and Simvastatin  Home Medications   Current Outpatient Rx  Name  Route  Sig  Dispense  Refill  . ASPIRIN 81 MG PO TBEC   Oral   Take 81 mg by mouth daily.           . FUROSEMIDE 20 MG PO TABS   Oral   Take 20 mg by mouth every morning.          . INSULIN ISOPHANE HUMAN 100 UNIT/ML [place] SUSP   Subcutaneous   Inject 20-25 Units into the skin 2 (two) times daily. Patient takes 25 units in the morning and 20 units at night         . ISOSORBIDE MONONITRATE ER 60 MG PO TB24   Oral   Take 60 mg by mouth every morning.          Marland Kitchen LABETALOL HCL 300 MG PO TABS   Oral   Take 300 mg by mouth daily.          Marland Kitchen LISINOPRIL 20 MG PO TABS   Oral   Take 20 mg by mouth daily.         Marland Kitchen  NIFEDIPINE ER OSMOTIC 60 MG PO TB24   Oral   Take 60 mg by mouth daily.         Frazier Butt OP   Ophthalmic   Apply 1 drop to eye daily.             BP 179/67  Pulse 78  Temp 98.8 F (37.1 C) (Oral)  Resp 18  Ht 5\' 4"  (1.626 m)  Wt 162 lb (73.483 kg)  BMI 27.81 kg/m2  SpO2 97%  Physical Exam  Nursing note and vitals reviewed. Constitutional: She appears well-developed and well-nourished.  HENT:  Head: Normocephalic and atraumatic.  Eyes: Conjunctivae normal are normal. Pupils are equal, round, and reactive to light.  Neck: Neck supple. No tracheal deviation present. No thyromegaly present.  Cardiovascular: Normal rate and regular rhythm.   No murmur heard. Pulmonary/Chest: Effort normal and breath sounds normal.  Abdominal: Soft. Bowel sounds are normal. She exhibits no distension. There is no tenderness.  Musculoskeletal: Normal range of motion. She exhibits no edema and no tenderness.  Neurological: She is alert. Coordination normal.  Skin: Skin is  warm and dry. No rash noted.  Psychiatric: She has a normal mood and affect.    ED Course  Procedures (including critical care time)  Labs Reviewed - No data to display No results found.  Date: 10/21/2012  Rate: 80  Rhythm: normal sinus rhythm  QRS Axis: left  Intervals: normal  ST/T Wave abnormalities: nonspecific ST/T changes  Conduction Disutrbances:nonspecific intraventricular conduction delay  Narrative Interpretation:   Old EKG Reviewed: Left axis deviation new from every 25th 2003 otherwise no significant changes interpreted by me Results for orders placed during the hospital encounter of 10/21/12  TROPONIN I      Component Value Range   Troponin I <0.30  <0.30 ng/mL  BASIC METABOLIC PANEL      Component Value Range   Sodium 138  135 - 145 mEq/L   Potassium 4.1  3.5 - 5.1 mEq/L   Chloride 102  96 - 112 mEq/L   CO2 28  19 - 32 mEq/L   Glucose, Bld 172 (*) 70 - 99 mg/dL   BUN 32 (*) 6 - 23 mg/dL   Creatinine, Ser 9.60 (*) 0.50 - 1.10 mg/dL   Calcium 45.4  8.4 - 09.8 mg/dL   GFR calc non Af Amer 24 (*) >90 mL/min   GFR calc Af Amer 28 (*) >90 mL/min  CBC WITH DIFFERENTIAL      Component Value Range   WBC 5.1  4.0 - 10.5 K/uL   RBC 3.95  3.87 - 5.11 MIL/uL   Hemoglobin 12.0  12.0 - 15.0 g/dL   HCT 11.9  14.7 - 82.9 %   MCV 91.6  78.0 - 100.0 fL   MCH 30.4  26.0 - 34.0 pg   MCHC 33.1  30.0 - 36.0 g/dL   RDW 56.2  13.0 - 86.5 %   Platelets 217  150 - 400 K/uL   Neutrophils Relative 63  43 - 77 %   Neutro Abs 3.2  1.7 - 7.7 K/uL   Lymphocytes Relative 27  12 - 46 %   Lymphs Abs 1.4  0.7 - 4.0 K/uL   Monocytes Relative 8  3 - 12 %   Monocytes Absolute 0.4  0.1 - 1.0 K/uL   Eosinophils Relative 2  0 - 5 %   Eosinophils Absolute 0.1  0.0 - 0.7 K/uL   Basophils Relative 0  0 -  1 %   Basophils Absolute 0.0  0.0 - 0.1 K/uL   Dg Chest Portable 1 View  10/21/2012  *RADIOLOGY REPORT*  Clinical Data: Chest pain.  PORTABLE CHEST - 1 VIEW  Comparison: Chest x-ray  09/19/2011.  Findings: Lung volumes are low.  No consolidative airspace disease. No pleural effusions.  No evidence of pulmonary edema.  Heart size is normal. The patient is rotated to the right on today's exam, resulting in distortion of the mediastinal contours and reduced diagnostic sensitivity and specificity for mediastinal pathology. Atherosclerosis of the thoracic aorta.  IMPRESSION: 1.  Low lung volumes without radiographic evidence of acute cardiopulmonary disease. 2.  Atherosclerosis.   Original Report Authenticated By: Trudie Reed, M.D.    No diagnosis found. Chest x-ray reviewed by me  10:05 AM remains asymptomatic MDM  Spoke with Dr. Phillips Odor plan 23 hour observation telemetry. Consider cardiology consult in the morning Diagnosis#1 unstable angina #2 hyperglycemia        Doug Sou, MD 10/21/12 2212  Doug Sou, MD 10/21/12 2213

## 2012-10-22 ENCOUNTER — Encounter (HOSPITAL_COMMUNITY): Payer: Self-pay | Admitting: Adult Health

## 2012-10-22 DIAGNOSIS — R079 Chest pain, unspecified: Secondary | ICD-10-CM

## 2012-10-22 DIAGNOSIS — N189 Chronic kidney disease, unspecified: Secondary | ICD-10-CM

## 2012-10-22 DIAGNOSIS — I2510 Atherosclerotic heart disease of native coronary artery without angina pectoris: Principal | ICD-10-CM

## 2012-10-22 LAB — HEPATIC FUNCTION PANEL
ALT: 9 U/L (ref 0–35)
Albumin: 3.6 g/dL (ref 3.5–5.2)
Indirect Bilirubin: 0.1 mg/dL — ABNORMAL LOW (ref 0.3–0.9)
Total Protein: 7.2 g/dL (ref 6.0–8.3)

## 2012-10-22 LAB — HEMOGLOBIN A1C
Hgb A1c MFr Bld: 6.5 % — ABNORMAL HIGH (ref <5.7)
Mean Plasma Glucose: 140 mg/dL — ABNORMAL HIGH (ref <117)

## 2012-10-22 LAB — LIPID PANEL
HDL: 39 mg/dL — ABNORMAL LOW (ref >39)
LDL Cholesterol: UNDETERMINED mg/dL (ref 0–99)

## 2012-10-22 LAB — GLUCOSE, CAPILLARY
Glucose-Capillary: 168 mg/dL — ABNORMAL HIGH (ref 70–99)
Glucose-Capillary: 115 mg/dL — ABNORMAL HIGH (ref 70–99)
Glucose-Capillary: 164 mg/dL — ABNORMAL HIGH (ref 70–99)

## 2012-10-22 LAB — TSH: TSH: 3.76 u[IU]/mL (ref 0.350–4.500)

## 2012-10-22 LAB — TROPONIN I: Troponin I: <0.3 ng/mL (ref <0.30)

## 2012-10-22 MED ORDER — LABETALOL HCL 200 MG PO TABS
300.0000 mg | ORAL_TABLET | Freq: Two times a day (BID) | ORAL | Status: DC
  Administered 2012-10-22 – 2012-10-26 (×8): 300 mg via ORAL
  Filled 2012-10-22 (×8): qty 2

## 2012-10-22 MED ORDER — CLONIDINE HCL 0.2 MG/24HR TD PTWK
0.2000 mg | MEDICATED_PATCH | TRANSDERMAL | Status: DC
  Administered 2012-10-29: 0.2 mg via TRANSDERMAL
  Filled 2012-10-22: qty 1

## 2012-10-22 MED ORDER — ATORVASTATIN CALCIUM 40 MG PO TABS
40.0000 mg | ORAL_TABLET | Freq: Every day | ORAL | Status: DC
  Administered 2012-10-23 – 2012-10-30 (×8): 40 mg via ORAL
  Filled 2012-10-22 (×9): qty 1

## 2012-10-22 MED ORDER — INSULIN NPH (HUMAN) (ISOPHANE) 100 UNIT/ML ~~LOC~~ SUSP
25.0000 [IU] | Freq: Every day | SUBCUTANEOUS | Status: DC
  Administered 2012-10-22: 25 [IU] via SUBCUTANEOUS
  Filled 2012-10-22: qty 10

## 2012-10-22 MED ORDER — FUROSEMIDE 10 MG/ML IJ SOLN
40.0000 mg | Freq: Two times a day (BID) | INTRAMUSCULAR | Status: DC
  Administered 2012-10-22: 40 mg via INTRAVENOUS
  Filled 2012-10-22: qty 4

## 2012-10-22 MED ORDER — AMLODIPINE BESYLATE 5 MG PO TABS
10.0000 mg | ORAL_TABLET | Freq: Every day | ORAL | Status: DC
  Administered 2012-10-23 – 2012-10-31 (×9): 10 mg via ORAL
  Filled 2012-10-22: qty 2
  Filled 2012-10-22: qty 1
  Filled 2012-10-22 (×8): qty 2

## 2012-10-22 MED ORDER — INSULIN NPH (HUMAN) (ISOPHANE) 100 UNIT/ML ~~LOC~~ SUSP
25.0000 [IU] | Freq: Every day | SUBCUTANEOUS | Status: DC
  Administered 2012-10-22 – 2012-10-23 (×2): 25 [IU] via SUBCUTANEOUS
  Filled 2012-10-22: qty 10

## 2012-10-22 MED ORDER — INSULIN NPH (HUMAN) (ISOPHANE) 100 UNIT/ML ~~LOC~~ SUSP: 20.0000 [IU] | Freq: Every day | SUBCUTANEOUS | Status: DC

## 2012-10-22 MED ORDER — FUROSEMIDE 10 MG/ML IJ SOLN
40.0000 mg | Freq: Every day | INTRAMUSCULAR | Status: DC
  Administered 2012-10-23: 40 mg via INTRAVENOUS
  Filled 2012-10-22: qty 4

## 2012-10-22 NOTE — Progress Notes (Signed)
Nutrition Brief Note  Patient identified on the Malnutrition Screening Tool (MST) Report  Body mass index is 27.81 kg/(m^2). Pt meets criteria for Overweight based on current BMI.   Current diet order is CHO Modified Med (1600-2000), patient is consumed approximately 100% of  Breakfast. She is being discharged today per pt and is very anxious to get home and take care of her cat. Labs and medications reviewed.   No nutrition interventions warranted at this time. If nutrition issues arise, please consult RD.   #[phone]

## 2012-10-22 NOTE — H&P (Addendum)
[facility] History and Physical  [name] ZOX:[id number] DOB: 07-20-1932 DOA: 10/21/2012  Referring physician: EDP PCP: Horald Pollen., PA  Specialists: [facility]  Chief Complaint: Chest Pain  HPI: [name] is a 78 y.o. female  With past medical history significant for hypertension, diastolic heart failure, and coronary artery disease status post PCI in 2012 who presents for  evaluation of chest pain. Pain is described as "burning", substernal, associated with mild diaphoresis and shortness of breath. Does not radiate, no pressure or palpitations. No association with food intake or indigestion. The pain has been intermittent for 2-3 days, she usually takes NTG and it goes away except for this evening- she took NTG SL X2 w/o relief and decided to come in for evaluation. No CP while in ED. No signs of ischemia on EKG, first troponin was negative. She is hypertensive but reports having taking her daily BP medications.   Review of Systems: Review of Systems  Constitutional: Positive for malaise/fatigue and diaphoresis. Negative for fever, chills and weight loss.  HENT: Negative.   Eyes: Negative.   Respiratory: Positive for shortness of breath. Negative for cough, hemoptysis, sputum production and wheezing.   Cardiovascular: Positive for chest pain. Negative for palpitations, orthopnea, claudication, leg swelling and PND.  Gastrointestinal: Negative for heartburn, nausea, vomiting, abdominal pain, diarrhea and constipation.  Genitourinary: Negative for dysuria and urgency.  Musculoskeletal: Positive for joint pain. Negative for myalgias.  Skin: Negative.   Neurological: Positive for weakness. Negative for dizziness, sensory change, speech change and focal weakness.  Endo/Heme/Allergies: Negative.   Psychiatric/Behavioral: Negative for depression. The patient is not nervous/anxious.   All other systems reviewed and are negative.     Past Medical History    Diagnosis Date  . CAD (coronary artery disease) 2003    Last catheterization 2008. Two-vessel PCI of the first OM branch and mid LAD.  Marland Kitchen CVA (cerebral vascular accident) 1996    LEFT SIDE   . IDDM (insulin dependent diabetes mellitus)   . CKD (chronic kidney disease)   . Myocardial infarction   . Congestive heart failure   . Hypercholesteremia    Past Surgical History  Procedure Date  . Appendectomy   . Knee surgery    Social History:  reports that she has never smoked. She does not have any smokeless tobacco history on file. She reports that she does not drink alcohol or use illicit drugs. Lives at home independently.  Allergies  Allergen Reactions  . Propoxyphene-Acetaminophen   . Simvastatin Other (See Comments)    headache    Family History  Problem Relation Age of Onset  . Diabetes Brother     RETINOPATHY   . Drug abuse Brother   . Liver cancer Brother   . Coronary artery disease Mother   . Breast cancer Daughter   . Heart attack Mother   . Heart attack Father   . Coronary artery disease Father   . Diabetes Sister     Prior to Admission medications   Medication Sig Start Date End Date Taking? Authorizing Provider  aspirin (LONGS ADULT LOW STRENGTH ASA) 81 MG EC tablet Take 81 mg by mouth daily.     Yes Historical Provider, MD  furosemide (LASIX) 20 MG tablet Take 20 mg by mouth every morning.    Yes Historical Provider, MD  insulin NPH (HUMULIN N,NOVOLIN N) 100 UNIT/ML injection Inject 20-25 Units into the skin 2 (two) times daily. Patient takes 25 units in the morning and 20  units at night   Yes Historical Provider, MD  isosorbide mononitrate (IMDUR) 60 MG 24 hr tablet Take 60 mg by mouth every morning.    Yes Historical Provider, MD  labetalol (NORMODYNE) 300 MG tablet Take 300 mg by mouth daily.    Yes Historical Provider, MD  lisinopril (PRINIVIL,ZESTRIL) 20 MG tablet Take 20 mg by mouth daily.   Yes Historical Provider, MD  NIFEdipine (PROCARDIA  XL/ADALAT-CC) 60 MG 24 hr tablet Take 60 mg by mouth daily.   Yes Historical Provider, MD  Polyethyl Glycol-Propyl Glycol (SYSTANE OP) Apply 1 drop to eye daily.     Yes Historical Provider, MD   Physical Exam: Filed Vitals:   10/21/12 1936 10/21/12 2000 10/21/12 2100 10/21/12 2356  BP: 212/70 179/67 187/67 173/69  Pulse: 86 78 76 78  Temp: 98.8 F (37.1 C)   98.2 F (36.8 C)  TempSrc: Oral   Oral  Resp: 18 18 12 16   Height: 5\' 4"  (1.626 m)     Weight: 73.483 kg (162 lb)     SpO2: 97% 97% 97% 98%   General appearance: NAD, conversant  Eyes: anicteric sclerae, moist conjunctivae; no lid-lag; PERRLA HENT: Atraumatic; oropharynx clear with moist mucous membranes and no mucosal ulcerations; normal hard and soft palate Neck: Trachea midline; FROM, supple, no thyromegaly or lymphadenopathy Lungs: CTA, with normal respiratory effort and no intercostal retractions CV: RRR, loud 3/6 SEM, +bilateral soft neck bruits  Abdomen: Soft, non-tender; no masses or HSM Extremities: Trace peripheral edema  Skin: Normal temperature, turgor and texture; no rash, ulcers or subcutaneous nodules Psych: Appropriate affect, alert and oriented to person, place and time  Labs on Admission:  Basic Metabolic Panel:  Lab 10/21/12 1610  NA 138  K 4.1  CL 102  CO2 28  GLUCOSE 172*  BUN 32*  CREATININE 1.93*  CALCIUM 10.3  MG --  PHOS --   Liver Function Tests:  Lab 10/21/12 2111  AST 11  ALT 9  ALKPHOS 61  BILITOT 0.2*  PROT 7.2  ALBUMIN 3.6   CBC:  Lab 10/21/12 2111  WBC 5.1  NEUTROABS 3.2  HGB 12.0  HCT 36.2  MCV 91.6  PLT 217   Cardiac Enzymes:  Lab 10/21/12 2111  CKTOTAL --  CKMB --  CKMBINDEX --  TROPONINI <0.30    BNP (last 3 results)  Basename 10/21/12 2111  PROBNP 1968.0*   CBG:  Lab 10/21/12 2359  GLUCAP 148*    Radiological Exams on Admission: Dg Chest Portable 1 View  10/21/2012  *RADIOLOGY REPORT*  Clinical Data: Chest pain.  PORTABLE CHEST - 1 VIEW   Comparison: Chest x-ray 09/19/2011.  Findings: Lung volumes are low.  No consolidative airspace disease. No pleural effusions.  No evidence of pulmonary edema.  Heart size is normal. The patient is rotated to the right on today's exam, resulting in distortion of the mediastinal contours and reduced diagnostic sensitivity and specificity for mediastinal pathology. Atherosclerosis of the thoracic aorta.  IMPRESSION: 1.  Low lung volumes without radiographic evidence of acute cardiopulmonary disease. 2.  Atherosclerosis.   Original Report Authenticated By: Trudie Reed, M.D.     EKG: Independently reviewed, paper copy in ED. NSR unchanged from priors.  Assessment/Plan Principal Problem:  *Chest pain Active Problems:  HYPERLIPIDEMIA-MIXED  CAD, NATIVE VESSEL  Palpitations  CARDIAC MURMUR, SYSTOLIC  CAROTID BRUIT  DM (diabetes mellitus)  CKD (chronic kidney disease)  HTN (hypertension)  78 yo with chest pain- multiple risk factors and known CAD  with stenting over a year ago. Description of pain more in line with GI source given burning nature of pain, but may be atypical presentation. Admitted for ACS rule out and possible cardiology evaluation for stress testing.    ASA, B. Blocker, Statin, O2 given.  Cycle CE, monitor on tele. 12 lead in AM  Gi cocktail empirically, started PPI  NTG prn for persistent CP  FLP in AM  Resumed HTN meds except for ACE-possible RAS and elevated Scr  SSI for DM  Code Status: Full Code Family Communication: Discussed plan of care with patient Disposition Plan: admitted as observation <2 midnights anticipated  Time spent: 50 minutes  [facility] Pager [phone]  If 7PM-7AM, please contact night-coverage [email or web address] Password [email or web address] 10/22/2012, 1:26 AM

## 2012-10-22 NOTE — Consult Note (Signed)
CARDIOLOGY CONSULT NOTE  Patient ID: [name] MRN: [id number] DOB/AGE: [birth year] 76 y.o.  Admit date: 10/21/2012 Referring Physician: P[facility]Gos[facility]i  Primary PhysicianMAIER,ANDREW C., PA Primary Cardiologist: [facility] [place] [facility]) Reason for Consultation:Chest Painw with known CAD Principal Problem:  *Chest pain Active Problems:  HYPERLIPIDEMIA-MIXED  CAD, NATIVE VESSEL  Palpitations  CARDIAC MURMUR, SYSTOLIC  CAROTID BRUIT  DM (diabetes mellitus)  CKD (chronic kidney disease)  HTN (hypertension)  HPI:[name] is a 78 y/o patient normally seen by Dr. [place] Lions in the [place] office who previously underwent PTCA of the LAD  in 2003, and subsequent Cypher stent placement to the mid LAD and OM1 in 2008. Nuclear stress test in 2011 indicated mild inferior ischemia and normal EF. This was medically treated. She has chronic  diastolic heart failure, moderate to severe hypertension, a history of medical noncompliance, CKD, and left cerebral CVA. She presented to [facility] with complaints of chest discomfort described as burning, with some relief using NTG.    On arrival the patient was found to be hypertensive with BP 212/70 mmHg. BUN/Creatinine-32/1.93 it is approximately baseline. [id number] with a history of chronic elevation. Cardiac enzymes were negative. CXR revealed no cardiopulmonary process. EKG-NSR, first degree AV block, LVH, prior anteroseptal MI, ST-T wave abnormalities consistent with lateral ischemia or LVH, which are new compared with a previous tracing performed 09/19/2011.  Previously evaluated by Dr. Fausto Skillern for renal insufficiency, who last saw her in October of 2013.  Renal ultrasound in January of 2013 excluded renal artery stenosis.   She admits to dietary noncompliance, as she eats fast food predominately.  She also does not take lasix BID as directed, but only once a day. She says that it makes her "run to much."   Review of systems complete and found to be  negative unless listed above   Past Medical History  Diagnosis Date  . CAD (coronary artery disease) 2003    Last catheterization 2008. Two-vessel PCI of the first OM branch and mid LAD.  Marland Kitchen CVA (cerebral vascular accident) 1996    LEFT SIDE   . IDDM (insulin dependent diabetes mellitus)   . CKD (chronic kidney disease)   . Myocardial infarction   . Congestive heart failure   . Hypercholesteremia     Family History  Problem Relation Age of Onset  . Diabetes Brother     RETINOPATHY   . Drug abuse Brother   . Liver cancer Brother   . Coronary artery disease Mother   . Breast cancer Daughter   . Heart attack Mother   . Heart attack Father   . Coronary artery disease Father   . Diabetes Sister     History   Social History  . Marital Status: Widowed    Spouse Name: N/A    Number of Children: N/A  . Years of Education: N/A   Occupational History  . Not on file.   Social History Main Topics  . Smoking status: Never Smoker   . Smokeless tobacco: Not on file  . Alcohol Use: No  . Drug Use: No  . Sexually Active: No   Other Topics Concern  . Not on file   Social History Narrative   Lives alone, but has a 76 year old grand daughter staying with her right now.    Past Surgical History  Procedure Date  . Appendectomy   . Knee surgery      Prescriptions prior to admission  Medication Sig Dispense Refill  . aspirin (LONGS ADULT  LOW STRENGTH ASA) 81 MG EC tablet Take 81 mg by mouth daily.        . furosemide (LASIX) 20 MG tablet Take 20 mg by mouth every morning.       . insulin NPH (HUMULIN N,NOVOLIN N) 100 UNIT/ML injection Inject 20-25 Units into the skin 2 (two) times daily. Patient takes 25 units in the morning and 20 units at night      . isosorbide mononitrate (IMDUR) 60 MG 24 hr tablet Take 60 mg by mouth every morning.       . labetalol (NORMODYNE) 300 MG tablet Take 300 mg by mouth daily.       Marland Kitchen lisinopril (PRINIVIL,ZESTRIL) 20 MG tablet Take 20 mg by mouth  daily.      Marland Kitchen NIFEdipine (PROCARDIA XL/ADALAT-CC) 60 MG 24 hr tablet Take 60 mg by mouth daily.      Bertram Gala Glycol-Propyl Glycol (SYSTANE OP) Apply 1 drop to eye daily.        Echocardiogram 2012  Left ventricle: The cavity size was normal. Wall thickness     was normal. Systolic function was normal. The estimated     ejection fraction was in the range of 55% to 60%.   - Aortic valve: Calcification of the non coronary cusp   - Left atrium: The atrium was mildly dilated.   - Atrial septum: No defect or patent foramen ovale was     identified.  Physical Exam: Blood pressure 179/67, pulse 76, temperature 98.1 F (36.7 C), temperature source Oral, resp. rate 16, height 5\' 4"  (1.626 m), weight 162 lb (73.483 kg), SpO2 100.00%.  General: Well developed, well nourished, obese, in no acute distress Head: Eyes PERRLA, No xanthomas.   Normal cephalic and atramatic  Lungs: Clear bilaterally to auscultation and percussion. Heart: HRRR S1 S2, soft S3, 2/6 systolic ejection murmur at the cardiac base.  Pulses are 2+ & equal.            Bilateral carotid bruit. No JVD.  No abdominal bruits. No femoral bruits. Abdomen: Bowel sounds are positive, abdomen soft and non-tender without masses or                  Hernia's noted. Msk:  Back normal,. Normal strength and tone for age. Extremities: No clubbing, cyanosis or edema.  DP +1 Neuro: Alert and oriented X 3. Psych:  Good affect, responds appropriately  Lab Results  Component Value Date   WBC 5.1 10/21/2012   HGB 12.0 10/21/2012   HCT 36.2 10/21/2012   MCV 91.6 10/21/2012   PLT 217 10/21/2012     Lab 10/21/12 2111  NA 138  K 4.1  CL 102  CO2 28  BUN 32*  CREATININE 1.93*  CALCIUM 10.3  PROT 7.2  BILITOT 0.2*  ALKPHOS 61  ALT 9  AST 11  GLUCOSE 172*   Lab Results  Component Value Date   CKTOTAL 41 09/20/2011   CKMB 2.3 09/20/2011   TROPONINI <0.30 10/22/2012    Lipid profile in 09/2012: Triglycerides-503, total-273, HDL-39,  LDL-cannot calculate  Radiology: Dg Chest Portable 1 View  10/21/2012  *RADIOLOGY REPORT*  Clinical Data: Chest pain.  PORTABLE CHEST - 1 VIEW  Comparison: Chest x-ray 09/19/2011.  Findings: Lung volumes are low.  No consolidative airspace disease. No pleural effusions.  No evidence of pulmonary edema.  Heart size is normal. The patient is rotated to the right on today's exam, resulting in distortion of the mediastinal contours and reduced  diagnostic sensitivity and specificity for mediastinal pathology. Atherosclerosis of the thoracic aorta.  IMPRESSION: 1.  Low lung volumes without radiographic evidence of acute cardiopulmonary disease. 2.  Atherosclerosis.   Original Report Authenticated By: Trudie Reed, M.D.    EKG: NSR, first degree AV block, prior anteroseptal MI., ST-T wave abnormality consistent with lateral ischemia or LVH, which is new when compared to previous tracing performed 09/19/11.  ASSESSMENT AND PLAN:   1.Uncontrolled hypertension:  Possibly partially related to dietary noncompliance with diet and medications.  Blood pressure is still moderately elevated despite amlodipine, isosorbide and labetalol.  Creatinine has been elevated to 2.02 in Oct of 2012, and has been up and down since that time. Currently it is 1.93, 1.77 on admission.  2. Acute on Chronic Diastolic CHF: BNP is 1960, which is about twice as high as it has been in the past. No compelling evidence for congestive heart failure. Will begin Lasix 40 mg IV BID for the next 24 hours.   3. CAD: Doubt symptoms are cardiac in etiology, but more related to hypertension. No acute changes in her EKG at present. Cardiac enzymes are negative.Will have echo completed as she has ongoing hypertension to evaluate LV fx and LV size assist in medical management.   4. Hyperlipidemia: Will need effective treatment  Bettey Mare. Lyman Bishop NP [facility] 10/22/2012, 1:58 PM  Cardiology Attending Patient interviewed and  examined. Discussed with Joni Reining, NP.  Above note annotated and modified based upon my findings.  Chest pain has resolved. It is unclear whether it was accompanied by significant ST segment depression on EKG. A repeat tracing will be obtained.  It is not clear that patient has presented with acute coronary syndrome. She does require adequate control of hypertension-clonidine will be added to her medical regime, and stress testing, which will be undertaken in the morning. Orwin Bing, MD 10/22/2012, 6:11 PM

## 2012-10-22 NOTE — Progress Notes (Signed)
UR Chart Review Completed  

## 2012-10-22 NOTE — Progress Notes (Signed)
     Subjective: This lady was admitted yesterday with burning in her chest. It was partially relieved by nitroglycerin. She does have a history of coronary disease. Cardiac enzymes so far negative.           Physical Exam: Blood pressure 179/67, pulse 76, temperature 98.1 F (36.7 C), temperature source Oral, resp. rate 16, height 5\' 4"  (1.626 m), weight 73.483 kg (162 lb), SpO2 100.00%. She looks systemically well. Heart sounds are present and normal. Lung fields are clear. She is alert and oriented.   Investigations:  No results found for this or any previous visit (from the past 240 hour(s)).   Basic Metabolic Panel:  [facility] 10/21/12 2111  NA 138  K 4.1  CL 102  CO2 28  GLUCOSE 172*  BUN 32*  CREATININE 1.93*  CALCIUM 10.3  MG --  PHOS --   Liver Function Tests:  [facility] 10/21/12 2111  AST 11  ALT 9  ALKPHOS 61  BILITOT 0.2*  PROT 7.2  ALBUMIN 3.6     CBC:  Basename 10/21/12 2111  WBC 5.1  NEUTROABS 3.2  HGB 12.0  HCT 36.2  MCV 91.6  PLT 217    Dg Chest Portable 1 View  10/21/2012  *RADIOLOGY REPORT*  Clinical Data: Chest pain.  PORTABLE CHEST - 1 VIEW  Comparison: Chest x-ray 09/19/2011.  Findings: Lung volumes are low.  No consolidative airspace disease. No pleural effusions.  No evidence of pulmonary edema.  Heart size is normal. The patient is rotated to the right on today's exam, resulting in distortion of the mediastinal contours and reduced diagnostic sensitivity and specificity for mediastinal pathology. Atherosclerosis of the thoracic aorta.  IMPRESSION: 1.  Low lung volumes without radiographic evidence of acute cardiopulmonary disease. 2.  Atherosclerosis.   Original Report Authenticated By: Trudie Reed, M.D.       Medications: I have reviewed the patient's current medications.  Impression: 1. Chest pain, unclear etiology, risk factors and history of coronary artery disease. 2. Hyperlipidemia. 3. Diabetes mellitus. 4.  Chronic disease. 5. Hypertension. Uncontrolled.     Plan: 1. Stop nifedipine. Start Norvasc 10 mg daily. 2. Cardiology consultation.     LOS: 1 day   Wilson Singer Pager [phone]  10/22/2012, 11:56 AM

## 2012-10-23 ENCOUNTER — Encounter (HOSPITAL_COMMUNITY): Payer: Self-pay | Admitting: Cardiology

## 2012-10-23 ENCOUNTER — Observation Stay (HOSPITAL_COMMUNITY): Payer: Medicare Other

## 2012-10-23 ENCOUNTER — Encounter (HOSPITAL_COMMUNITY): Payer: Self-pay

## 2012-10-23 DIAGNOSIS — I679 Cerebrovascular disease, unspecified: Secondary | ICD-10-CM

## 2012-10-23 DIAGNOSIS — I2510 Atherosclerotic heart disease of native coronary artery without angina pectoris: Secondary | ICD-10-CM

## 2012-10-23 DIAGNOSIS — E119 Type 2 diabetes mellitus without complications: Secondary | ICD-10-CM

## 2012-10-23 DIAGNOSIS — E785 Hyperlipidemia, unspecified: Secondary | ICD-10-CM

## 2012-10-23 DIAGNOSIS — R079 Chest pain, unspecified: Secondary | ICD-10-CM

## 2012-10-23 DIAGNOSIS — I7090 Unspecified atherosclerosis: Secondary | ICD-10-CM

## 2012-10-23 LAB — BASIC METABOLIC PANEL
CO2: 27 mEq/L (ref 19–32)
Chloride: 102 mEq/L (ref 96–112)
Chloride: 99 mEq/L (ref 96–112)
Creatinine, Ser: 2.76 mg/dL — ABNORMAL HIGH (ref 0.50–1.10)
GFR calc Af Amer: 20 mL/min — ABNORMAL LOW (ref >90)
Potassium: 4.1 mEq/L (ref 3.5–5.1)
Potassium: 4.3 mEq/L (ref 3.5–5.1)
Sodium: 138 mEq/L (ref 135–145)

## 2012-10-23 LAB — GLUCOSE, CAPILLARY
Glucose-Capillary: 80 mg/dL (ref 70–99)
Glucose-Capillary: 163 mg/dL — ABNORMAL HIGH (ref 70–99)

## 2012-10-23 MED ORDER — REGADENOSON 0.4 MG/5ML IV SOLN
INTRAVENOUS | Status: AC
  Administered 2012-10-23: 0.4 mg via INTRAVENOUS
  Filled 2012-10-23: qty 5

## 2012-10-23 MED ORDER — CLONIDINE HCL 0.2 MG/24HR TD PTWK: 1.0000 | MEDICATED_PATCH | TRANSDERMAL | Status: DC

## 2012-10-23 MED ORDER — TECHNETIUM TC 99M SESTAMIBI - CARDIOLITE
30.0000 | Freq: Once | INTRAVENOUS | Status: AC | PRN
  Administered 2012-10-23: 11:00:00 30 via INTRAVENOUS

## 2012-10-23 MED ORDER — AMLODIPINE BESYLATE 10 MG PO TABS: 10.0000 mg | ORAL_TABLET | Freq: Every day | ORAL | Status: DC

## 2012-10-23 MED ORDER — ATORVASTATIN CALCIUM 40 MG PO TABS: 40.0000 mg | ORAL_TABLET | Freq: Every day | ORAL | Status: DC

## 2012-10-23 MED ORDER — SODIUM CHLORIDE 0.9 % IJ SOLN
INTRAMUSCULAR | Status: AC
  Administered 2012-10-23: 10 mL via INTRAVENOUS
  Filled 2012-10-23: qty 10

## 2012-10-23 MED ORDER — ENOXAPARIN SODIUM 30 MG/0.3ML ~~LOC~~ SOLN
30.0000 mg | SUBCUTANEOUS | Status: DC
  Administered 2012-10-24 – 2012-10-30 (×8): 30 mg via SUBCUTANEOUS
  Filled 2012-10-23 (×8): qty 0.3

## 2012-10-23 MED ORDER — TECHNETIUM TC 99M SESTAMIBI - CARDIOLITE
10.0000 | Freq: Once | INTRAVENOUS | Status: AC | PRN
  Administered 2012-10-23: 09:00:00 9 via INTRAVENOUS

## 2012-10-23 NOTE — Progress Notes (Signed)
UR Chart Review Completed  

## 2012-10-23 NOTE — Progress Notes (Signed)
Inpatient Diabetes Program Recommendations  AACE/ADA: New Consensus Statement on Inpatient Glycemic Control  Target Ranges:  Prepandial:   less than 140 mg/dL      Peak postprandial:   less than 180 mg/dL (1-2 hours)      Critically ill patients:  140 - 180 mg/dL  Pager:  [phone] Hours:  8 am-10pm   Reason for Visit: Hypoglycemia 11/26 in am :  66 mg/dL  Inpatient Diabetes Program Recommendations Insulin - Basal: Decrease evening dose of NPH to 20 units  Note: Patient takes NPH 25 units in am and 20 units in pm  Alfredia Client PhD, RN, BC-ADM Diabetes Coordinator  Office:  [phone] Team Pager:  [phone]

## 2012-10-23 NOTE — Progress Notes (Signed)
[name]  78 y.o.  female  Subjective: No problems overnight. Denies chest discomfort or dyspnea. Eager for discharge.  Allergy: Propoxyphene-acetaminophen and Simvastatin  Objective: Vital signs in last 24 hours: Temp:  [97.9 F (36.6 C)-98.3 F (36.8 C)] 98.3 F (36.8 C) (11/26 0610) Pulse Rate:  [65-69] 69  (11/26 0610) Resp:  [16-18] 18  (11/26 0610) BP: (129-148)/(67-70) 129/70 mmHg (11/26 0610) SpO2:  [96 %-97 %] 96 % (11/26 0610) Weight:  [74.9 kg (165 lb 2 oz)] 74.9 kg (165 lb 2 oz) (11/26 0610)  74.9 kg (165 lb 2 oz) Body mass index is 28.34 kg/(m^2).  Weight change: 1.417 kg (3 lb 2 oz) since admission Last BM Date: 10/21/12  Intake/Output since admission: +760 cc  General- Well developed; no acute distress; overweight Neck- No JVD, no carotid bruits Lungs- clear lung fields; normal I:E ratio Cardiovascular- normal PMI; normal S1 and S2; modest systolic ejection murmur Abdomen- normal bowel sounds; soft and non-tender without masses or organomegaly Skin- Warm, no significant lesions Extremities- Nl distal pulses; no edema  Lab Results: Cardiac Markers:   Basename 10/22/12 1140 10/22/12 0521  TROPONINI <0.30 <0.30   Pro BNP: 1968  CBC:   Basename 10/21/12 2111  WBC 5.1  HGB 12.0  HCT 36.2  PLT 217   BMET:  Basename 10/23/12 0502 10/21/12 2111  NA 138 138  K 4.1 4.1  CL 102 102  CO2 28 28  GLUCOSE 66* 172*  BUN 44* 32*  CREATININE 2.50* 1.93*  CALCIUM 10.2 10.3   Hepatic Function:   Basename 10/21/12 2111  PROT 7.2  ALBUMIN 3.6  AST 11  ALT 9  ALKPHOS 61  BILITOT 0.2*  BILIDIR 0.1  IBILI 0.1*   GFR:  Estimated Creatinine Clearance: 18.4 ml/min (by C-G formula based on Cr of 2.5). Lipids:   Basename 10/22/12 0521  CHOL 273*  TRIG 503*  HDL 39*   EKG:  Normal sinus rhythm; probable LVH with lateral ST-T wave abnormality; prior anterior MI, but ST segment elevation and Q waves are more prominent in leads V4-V6 than on the previous  tracing performed 10/21/12.  Imaging Studies/Results: Dg Chest  10/21/2012: Atherosclerosis of the thoracic aorta.  Low lung volumes without radiographic evidence of acute cardiopulmonary disease. 2.  Atherosclerosis.   Original Report Authenticated By: Trudie Reed, M.D.    Imaging: Imaging results have been reviewed  Medications: I have reviewed the patient's current medications.   Assessment/Plan: Chest pain:  No recurrence of symptoms, but stress nuclear study demonstrates ischemia in 2 vascular territories. Further diagnostic testing, specifically cardiac catheterization, would be desirable; however, patient has moderate chronic kidney disease with exacerbation since admission. It will be necessary to stabilize this problem before considering catheterization.   Hyperlipidemia: Lipid profile in 09/2012:273, 503, 39, NA.  Markedly abnormal total cholesterol in the setting of known coronary artery disease will require appropriate pharmacologic therapy.   Arteriosclerotic cardiovascular disease: Currently admission and as not clearly represent an acute coronary syndrome, but stress nuclear study is at least moderate risk. Coronary angiography advisable, but will be deferred until optimal safety can be achieved.   Chronic kidney disease: Substantial increase in creatinine over 24 hours.  Patient is receiving no nephrotoxic drugs, but lisinopril  And furosemide were discontinued just this morning.  Control of hypertension has been gradual and appropriate. Repeat metabolic profile will be obtained in the morning. Administration of contrast cannot be considered at present, unless emergent.  Hypertension: Blood pressure is markedly  improved and optimal with current medication, which will be continued.  [name] 10/23/2012, 6:55 PM

## 2012-10-23 NOTE — Progress Notes (Signed)
Stress Lab Nurses Notes - [name] 10/23/2012 Reason for doing test: Chest Pain Type of test: Marlane Hatcher Nurse performing test: Parke Poisson, RN Nuclear Medicine Tech: Lou Cal Echo Tech: Not Applicable MD performing test: [name] Family MD: Helene Kelp, PA Test explained and consent signed: yes IV started: 20g jelco, Saline lock flushed, No redness or edema and Saline lock from floor Symptoms: None Treatment/Intervention: None Reason test stopped: protocol completed After recovery IV was: No redness or edema and Saline Lock flushed Patient to return to Nuc. Med at : 11:30 am Patient discharged: Transported back to room 314 via WC Patient's Condition upon discharge was: stable Comments: During test BP 114/45 & HR 76.  Recovery BP 117/47 & HR 70.  Symptoms resolved in recovery. Erskine Speed T

## 2012-10-24 DIAGNOSIS — I10 Essential (primary) hypertension: Secondary | ICD-10-CM

## 2012-10-24 DIAGNOSIS — I200 Unstable angina: Secondary | ICD-10-CM | POA: Diagnosis present

## 2012-10-24 DIAGNOSIS — I059 Rheumatic mitral valve disease, unspecified: Secondary | ICD-10-CM

## 2012-10-24 LAB — BASIC METABOLIC PANEL
GFR calc Af Amer: 19 mL/min — ABNORMAL LOW (ref >90)
GFR calc non Af Amer: 16 mL/min — ABNORMAL LOW (ref >90)
Glucose, Bld: 189 mg/dL — ABNORMAL HIGH (ref 70–99)
Potassium: 4.4 mEq/L (ref 3.5–5.1)
Sodium: 135 mEq/L (ref 135–145)

## 2012-10-24 LAB — GLUCOSE, CAPILLARY
Glucose-Capillary: 55 mg/dL — ABNORMAL LOW (ref 70–99)
Glucose-Capillary: 179 mg/dL — ABNORMAL HIGH (ref 70–99)
Glucose-Capillary: 168 mg/dL — ABNORMAL HIGH (ref 70–99)

## 2012-10-24 MED ORDER — SODIUM CHLORIDE 0.9 % IV SOLN
INTRAVENOUS | Status: DC
  Administered 2012-10-24 – 2012-10-26 (×6): via INTRAVENOUS

## 2012-10-24 MED ORDER — INSULIN NPH (HUMAN) (ISOPHANE) 100 UNIT/ML ~~LOC~~ SUSP
20.0000 [IU] | Freq: Every day | SUBCUTANEOUS | Status: DC
  Administered 2012-10-24 – 2012-10-26 (×2): 20 [IU] via SUBCUTANEOUS

## 2012-10-24 MED ORDER — INSULIN NPH (HUMAN) (ISOPHANE) 100 UNIT/ML ~~LOC~~ SUSP
20.0000 [IU] | Freq: Every day | SUBCUTANEOUS | Status: DC
  Administered 2012-10-25 – 2012-10-27 (×3): 20 [IU] via SUBCUTANEOUS
  Filled 2012-10-24: qty 10

## 2012-10-24 MED ORDER — OMEGA-3-ACID ETHYL ESTERS 1 G PO CAPS
1.0000 g | ORAL_CAPSULE | Freq: Two times a day (BID) | ORAL | Status: DC
  Administered 2012-10-24 – 2012-10-31 (×15): 1 g via ORAL
  Filled 2012-10-24 (×15): qty 1

## 2012-10-24 NOTE — Progress Notes (Signed)
Pt's CBG 55.  Pt A&O with no complaints.  Hypoglycemia protocol initiated.  Pt provided with breakfast tray.  CBG recheck of 67.  Pt finished eating breakfast.  Pt's CBG 124.

## 2012-10-24 NOTE — Progress Notes (Addendum)
Primary cardiologist: Dr. Rollene Rotunda  Subjective:   No chest burning or shortness of breath. No palpitations. Stable appetite.   Objective:   Temp:  [98.1 F (36.7 C)] 98.1 F (36.7 C) (11/27 0500) Pulse Rate:  [64-73] 69  (11/27 0928) Resp:  [20] 20  (11/27 0500) BP: (120-133)/(63-65) 120/63 mmHg (11/27 0928) SpO2:  [94 %-99 %] 99 % (11/27 0928) Weight:  [160 lb 0.9 oz (72.6 kg)] 160 lb 0.9 oz (72.6 kg) (11/27 0630) Last BM Date: 10/21/12  Filed Weights   10/21/12 1936 10/23/12 0610 10/24/12 0630  Weight: 162 lb (73.483 kg) 165 lb 2 oz (74.9 kg) 160 lb 0.9 oz (72.6 kg)    Intake/Output Summary (Last 24 hours) at 10/24/12 1009 Last data filed at 10/24/12 0631  Gross per 24 hour  Intake      0 ml  Output    800 ml  Net   -800 ml   Telemetry: Sinus rhythm with prolonged PR.  Exam:  General: No acute distress.  Lungs: Clear, nonlabored.  Cardiac: RRR, 2/6 systolic murmur. No gallop.  Extremities: No pitting.  Lab Results:  Basic Metabolic Panel:  Lab 10/23/12 0454 10/23/12 0502 10/21/12 2111  NA 137 138 138  K 4.3 4.1 4.1  CL 99 102 102  CO2 27 28 28   GLUCOSE 190* 66* 172*  BUN 52* 44* 32*  CREATININE 2.76* 2.50* 1.93*  CALCIUM 9.9 10.2 10.3  MG -- -- --    Liver Function Tests:  Lab 10/21/12 2111  AST 11  ALT 9  ALKPHOS 61  BILITOT 0.2*  PROT 7.2  ALBUMIN 3.6    CBC:  Lab 10/21/12 2111  WBC 5.1  HGB 12.0  HCT 36.2  MCV 91.6  PLT 217    Cardiac Enzymes:  Lab 10/22/12 1140 10/22/12 0521 10/21/12 2111  CKTOTAL -- -- --  CKMB -- -- --  CKMBINDEX -- -- --  TROPONINI <0.30 <0.30 <0.30    BNP:  Basename 10/21/12 2111  PROBNP 1968.0*    ECG: Evidence of anterolateral infarct with residual ST segment elevation in the absence of chest pain.   Medications:   Scheduled Medications:    . amLODipine  10 mg Oral Daily  . aspirin EC  81 mg Oral Daily  . atorvastatin  40 mg Oral q1800  . cloNIDine  0.2 mg Transdermal Weekly    . enoxaparin (LOVENOX) injection  30 mg Subcutaneous Q24H  . insulin aspart  0-15 Units Subcutaneous TID WC  . insulin aspart  0-5 Units Subcutaneous QHS  . insulin NPH  20 Units Subcutaneous QAC breakfast  . insulin NPH  25 Units Subcutaneous QAC supper  . isosorbide mononitrate  60 mg Oral Daily  . labetalol  300 mg Oral BID  . pantoprazole  40 mg Oral Daily  . [COMPLETED] regadenoson      . sodium chloride  3 mL Intravenous Q12H  . [COMPLETED] sodium chloride      . [DISCONTINUED] enoxaparin (LOVENOX) injection  40 mg Subcutaneous Q24H  . [DISCONTINUED] furosemide  40 mg Intravenous Daily  . [DISCONTINUED] insulin NPH  25 Units Subcutaneous QAC breakfast     Infusions:    . sodium chloride 100 mL/hr at 10/24/12 0932     PRN Medications:  sodium chloride, acetaminophen, gi cocktail, nitroGLYCERIN, ondansetron (ZOFRAN) IV, sodium chloride, [COMPLETED] technetium sestamibi   Assessment:   1. Unstable angina, troponin I levels are normal. ECG abnormal with evidence of anterolateral infarct, dynamic ST  segment changes. She has been symptomatically stable during hospitalization.  2. Known CAD status post Cypher DES to the first obtuse marginal and mid LAD in 2008. Lexiscan Myoview performed yesterday revealed evidence of distal anteroseptal ischemia as well as inferior ischemia, LVEF 65%.  3. Acute on chronic renal failure, creatinine 1.9 up to 2.7 since 11/25. Patient states that she follows with Dr. Kristian Covey, baseline creatinine possibly around 2.0 based on lab work from last year. She has been on Lasix and also lisinopril earlier in the hospitalization, both of which were recently discontinued.  4. Hyperlipidemia, presently on Lipitor. Total cholesterol 273, triglycerides 503.  5. Hypertension, blood pressure coming under better control.  6. Type 2 diabetes mellitus.  7. Cardiac murmur with previously documented aortic valve calcification as of 2012. Followup  echocardiogram pending.   Plan/Discussion:    Case discussed with the patient and also Dr. Karilyn Cota. Although cardiac catheterization is certainly a reasonable next step in her cardiac evaluation, would defer until renal function hopefully can improve to baseline. Would plan gentle hydration, hold off on ACE inhibitor or further diuretics. Would also add omega-3 supplements in light of her elevated triglycerides, continue statin therapy. Blood pressure and heart rate are relatively well controlled on current regimen. She can ultimately be transferred to our cardiology service in [place] depending on improvement in renal function that would allow cardiac catheterization, or if her clinical status deteriorates. She has been stable over the last 48 hours.   Jonelle Sidle, M.D., F.A.C.C.

## 2012-10-24 NOTE — Progress Notes (Signed)
Subjective: This lady is very stable symptomatically. She has no further chest discomfort. Unfortunately, her stress test was positive. Also, in the meantime her renal function has deteriorated.           Physical Exam: Blood pressure 120/63, pulse 69, temperature 98.1 F (36.7 C), temperature source Oral, resp. rate 20, height 5\' 4"  (1.626 m), weight 72.6 kg (160 lb 0.9 oz), SpO2 99.00%. She looks systemically well. Heart sounds are present and normal. Lung fields are clear. She is alert and oriented.   Investigations:     Basic Metabolic Panel:  Basename 10/23/12 1949 10/23/12 0502  NA 137 138  K 4.3 4.1  CL 99 102  CO2 27 28  GLUCOSE 190* 66*  BUN 52* 44*  CREATININE 2.76* 2.50*  CALCIUM 9.9 10.2  MG -- --  PHOS -- --   Liver Function Tests:  [facility] 10/21/12 2111  AST 11  ALT 9  ALKPHOS 61  BILITOT 0.2*  PROT 7.2  ALBUMIN 3.6     CBC:  Basename 10/21/12 2111  WBC 5.1  NEUTROABS 3.2  HGB 12.0  HCT 36.2  MCV 91.6  PLT 217    Nm Myocar Single W/spect W/wall Motion And Ef  10/23/2012  nm myoview pharmacologic stress  Ordering Physician: [place] Bing  Reading Physician: [place] Bing  Clinical Data: 78 year old woman with known coronary artery disease admitted to hospital with chest discomfort.  NUCLEAR MEDICINE ADENOSINE STRESS MYOVIEW STUDY WITH SPECT AND LEFT VENTRIUCLAR EJECTION FRACTION  Radionuclide Data: One-day rest/stress protocol performed with 10/30 mCi of Tc-26m Myoview.  Stress Data: Regadenoson infusion resulted in no symptoms.  There was a modest increase in heart rate and minimal decrease in systolic blood pressure following drug administration.  No arrhythmias noted.  EKG: Normal sinus rhythm; first-degree AV block; LVH with repolarization abnormality; septal myocardial infarction of indeterminate age. No significant change   with pharmacologic stress.  Scintigraphic Data: Acquisition notable for mild movement, mild breast  attenuation and mild diaphragmatic attenuation.  Left ventricular size was at the upper limit of normal.  On tomographic images reconstructed in standard planes, there was a small to moderate defect involving the inferior wall and extending inferolaterally and inferoseptally.  Tracer uptake was moderately depressed.   Moderate but incomplete reversibility was appreciated.  A small defect of mild to moderate severity was present in the distal anteroseptal segment extending apically.  Near complete reversibility was apparent by comparison to the resting portion of the study.  The gated reconstruction demonstrated normal regional and global LV systolic function as well as normal systolic accentuation of activity throughout.  Estimated ejection fraction was 65%.  IMPRESSION: Abnormal pharmacologic stress nuclear study revealing borderline left ventricular dilatation, normal LV systolic function, a negative stress  EKG, but scintigraphic evidence for inferior and distal anteroseptal ischemia suggesting significant obstructive disease in the distribution of two coronary vessels.  Other findings as noted.   Original Report Authenticated By: [place] Bing       Medications: I have reviewed the patient's current medications.  Impression: 1. Chest pain with positive stress test. 2. Hyperlipidemia. 3. Diabetes mellitus. 4. Acute on chronic kidney disease. Worsening creatinine. 5. Hypertension. Better control.     Plan: 1. Start intravenous fluids. 2. Monitor renal function closely. 3. Agree with starting Lovaza for her hypertriglyceridemia. 4. Hopefully, she can still obtain cardiac catheterization when renal function stabilizes.     LOS: 3 days   Wilson Singer Pager [phone]  10/24/2012,  10:54 AM

## 2012-10-24 NOTE — Progress Notes (Signed)
*  PRELIMINARY RESULTS* Echocardiogram 2D Echocardiogram has been performed.  Conrad [place] 10/24/2012, 4:00 PM

## 2012-10-24 NOTE — Care Management Note (Signed)
    Page 1 of 1   10/31/2012     10:48:23 AM   CARE MANAGEMENT NOTE 10/31/2012  Patient:  [name], [name]   Account Number:  [id number]  Date Initiated:  10/24/2012  Documentation initiated by:  Rosemary Holms  Subjective/Objective Assessment:   Pt admitted from home. Stress test indicates a need for cath but now working up kidney issues.     Action/Plan:   DC home unless transferred for Cardiac Cath   Anticipated DC Date:  10/31/2012   Anticipated DC Plan:  HOME/SELF CARE      DC Planning Services  CM consult      Choice offered to / List presented to:             Status of service:  Completed, signed off Medicare Important Message given?  YES (If response is "NO", the following Medicare IM given date fields will be blank) Date Medicare IM given:  10/26/2012 Date Additional Medicare IM given:    Discharge Disposition:    Per UR Regulation:    If discussed at Long Length of Stay Meetings, dates discussed:    Comments:  10/31/12 Rosemary Holms RN BSN CM Pt DC and left before IM signed. Declined HH  10/29/12 Weslyn Holsonback RN BNS CM Pt unable to go for Cath due to renal func.and low Hgb. Will continue to follow  10/24/12 Rosemary Holms RN BSN CM

## 2012-10-25 LAB — GLUCOSE, CAPILLARY
Glucose-Capillary: 157 mg/dL — ABNORMAL HIGH (ref 70–99)
Glucose-Capillary: 108 mg/dL — ABNORMAL HIGH (ref 70–99)

## 2012-10-25 LAB — CBC
MCH: 30.3 pg (ref 26.0–34.0)
MCHC: 33.2 g/dL (ref 30.0–36.0)
MCV: 91.2 fL (ref 78.0–100.0)
Platelets: 144 10*3/uL — ABNORMAL LOW (ref 150–400)

## 2012-10-25 LAB — COMPREHENSIVE METABOLIC PANEL WITH GFR
ALT: 9 U/L (ref 0–35)
AST: 9 U/L (ref 0–37)
Albumin: 2.8 g/dL — ABNORMAL LOW (ref 3.5–5.2)
Alkaline Phosphatase: 48 U/L (ref 39–117)
BUN: 49 mg/dL — ABNORMAL HIGH (ref 6–23)
CO2: 24 meq/L (ref 19–32)
Calcium: 9.1 mg/dL (ref 8.4–10.5)
Chloride: 104 meq/L (ref 96–112)
Creatinine, Ser: 2.33 mg/dL — ABNORMAL HIGH (ref 0.50–1.10)
GFR calc Af Amer: 22 mL/min — ABNORMAL LOW
GFR calc non Af Amer: 19 mL/min — ABNORMAL LOW
Glucose, Bld: 191 mg/dL — ABNORMAL HIGH (ref 70–99)
Potassium: 4.5 meq/L (ref 3.5–5.1)
Sodium: 136 meq/L (ref 135–145)
Total Bilirubin: 0.1 mg/dL — ABNORMAL LOW (ref 0.3–1.2)
Total Protein: 5.5 g/dL — ABNORMAL LOW (ref 6.0–8.3)

## 2012-10-25 NOTE — Progress Notes (Signed)
Chart reviewed.  Subjective: No chest pain.  Patient reports she will leave tomorrow, to pick up her check and pain to light DL, otherwise her lites will get turned off. No shortness of breath.  Objective: Vital signs in last 24 hours: Filed Vitals:   10/24/12 2139 10/25/12 0610 10/25/12 1048 10/25/12 1341  BP: 147/69 149/68 153/68 146/65  Pulse: 68 68 65 67  Temp: 98.3 F (36.8 C) 98.1 F (36.7 C)  97.9 F (36.6 C)  TempSrc: Oral Oral  Oral  Resp: 20 20  20   Height:      Weight:  74.7 kg (164 lb 10.9 oz)    SpO2: 96% 96% 98% 96%   Weight change: 2.1 kg (4 lb 10.1 oz)  Intake/Output Summary (Last 24 hours) at 10/25/12 1346 Last data filed at 10/25/12 1200  Gross per 24 hour  Intake 2763.33 ml  Output   1400 ml  Net 1363.33 ml   General: Comfortable. Lying supine. Lungs clear to auscultation bilaterally without wheeze rhonchi or rales Cardiovascular regular rate rhythm without murmurs gallops rubs Abdomen soft nontender nondistended Extremities no clubbing cyanosis or edema  Lab Results: Basic Metabolic Panel:  Lab 10/25/12 1610 10/24/12 1146  NA 136 135  K 4.5 4.4  CL 104 99  CO2 24 27  GLUCOSE 191* 189*  BUN 49* 53*  CREATININE 2.33* 2.63*  CALCIUM 9.1 9.6  MG -- --  PHOS -- --   Liver Function Tests:  Lab 10/25/12 0504 10/21/12 2111  AST 9 11  ALT 9 9  ALKPHOS 48 61  BILITOT 0.1* 0.2*  PROT 5.5* 7.2  ALBUMIN 2.8* 3.6   No results found for this basename: LIPASE:2,AMYLASE:2 in the last 168 hours No results found for this basename: AMMONIA:2 in the last 168 hours CBC:  Lab 10/25/12 0504 10/21/12 2111  WBC 3.1* 5.1  NEUTROABS -- 3.2  HGB 8.9* 12.0  HCT 26.8* 36.2  MCV 91.2 91.6  PLT 144* 217   Cardiac Enzymes:  Lab 10/22/12 1140 10/22/12 0521 10/21/12 2111  CKTOTAL -- -- --  CKMB -- -- --  CKMBINDEX -- -- --  TROPONINI <0.30 <0.30 <0.30   BNP:  Lab 10/21/12 2111  PROBNP 1968.0*   D-Dimer: No results found for this basename:  DDIMER:2 in the last 168 hours CBG:  Lab 10/25/12 1107 10/25/12 0739 10/24/12 2137 10/24/12 1607 10/24/12 1114 10/24/12 0828  GLUCAP 162* 157* 155* 168* 179* 124*   Hemoglobin A1C:  Lab 10/21/12 2111  HGBA1C 6.5*   Fasting Lipid Panel:  Lab 10/22/12 0521  CHOL 273*  HDL 39*  LDLCALC UNABLE TO CALCULATE IF TRIGLYCERIDE OVER 400 mg/dL  TRIG 960*  CHOLHDL 7.0  LDLDIRECT --   Thyroid Function Tests:  Lab 10/21/12 2111  TSH 3.760  T4TOTAL --  FREET4 --  T3FREE --  THYROIDAB --    Scheduled Meds:   . amLODipine  10 mg Oral Daily  . aspirin EC  81 mg Oral Daily  . atorvastatin  40 mg Oral q1800  . cloNIDine  0.2 mg Transdermal Weekly  . enoxaparin (LOVENOX) injection  30 mg Subcutaneous Q24H  . insulin aspart  0-15 Units Subcutaneous TID WC  . insulin aspart  0-5 Units Subcutaneous QHS  . insulin NPH  20 Units Subcutaneous QAC breakfast  . insulin NPH  20 Units Subcutaneous QAC supper  . isosorbide mononitrate  60 mg Oral Daily  . labetalol  300 mg Oral BID  . omega-3 acid ethyl esters  1 g Oral BID  . pantoprazole  40 mg Oral Daily  . sodium chloride  3 mL Intravenous Q12H  . [DISCONTINUED] insulin NPH  25 Units Subcutaneous QAC supper   Continuous Infusions:   . sodium chloride 100 mL/hr at 10/25/12 0813   PRN Meds:.sodium chloride, acetaminophen, gi cocktail, nitroGLYCERIN, ondansetron (ZOFRAN) IV, sodium chloride Assessment/Plan:  Principal Problem:  *Chest pain Active Problems:  Hyperlipidemia  Arteriosclerotic cardiovascular disease (ASCVD)  CARDIAC MURMUR, SYSTOLIC  Cerebrovascular disease  DM (diabetes mellitus)  CKD (chronic kidney disease)  Hypertension  Unstable angina  Patient is stable. Her creatinine is improving. Will discuss with Dr. Karilyn Cota Re: continued hospitalization versus discharge and consideration for outpatient cardiac catheterization. It is unclear from previous notes whether cardiology felt she should remain in patient or not  over the holiday weekend.   LOS: 4 days   Zyiah Withington L 10/25/2012, 1:46 PM

## 2012-10-26 ENCOUNTER — Inpatient Hospital Stay (HOSPITAL_COMMUNITY): Payer: Medicare Other

## 2012-10-26 DIAGNOSIS — I5032 Chronic diastolic (congestive) heart failure: Secondary | ICD-10-CM

## 2012-10-26 DIAGNOSIS — N179 Acute kidney failure, unspecified: Secondary | ICD-10-CM | POA: Diagnosis present

## 2012-10-26 LAB — BASIC METABOLIC PANEL
CO2: 23 mEq/L (ref 19–32)
Chloride: 110 mEq/L (ref 96–112)
GFR calc non Af Amer: 22 mL/min — ABNORMAL LOW (ref >90)
Glucose, Bld: 140 mg/dL — ABNORMAL HIGH (ref 70–99)
Potassium: 4.9 mEq/L (ref 3.5–5.1)
Sodium: 141 mEq/L (ref 135–145)

## 2012-10-26 LAB — GLUCOSE, CAPILLARY: Glucose-Capillary: 82 mg/dL (ref 70–99)

## 2012-10-26 LAB — PRO B NATRIURETIC PEPTIDE: Pro B Natriuretic peptide (BNP): 3196 pg/mL — ABNORMAL HIGH (ref 0–450)

## 2012-10-26 MED ORDER — ISOSORBIDE MONONITRATE ER 60 MG PO TB24
120.0000 mg | ORAL_TABLET | Freq: Every day | ORAL | Status: DC
  Administered 2012-10-27 – 2012-10-31 (×5): 120 mg via ORAL
  Filled 2012-10-26 (×5): qty 2

## 2012-10-26 MED ORDER — SODIUM CHLORIDE 0.9 % IV SOLN: INTRAVENOUS | Status: DC

## 2012-10-26 MED ORDER — LABETALOL HCL 200 MG PO TABS
400.0000 mg | ORAL_TABLET | Freq: Two times a day (BID) | ORAL | Status: DC
  Administered 2012-10-26 – 2012-10-31 (×9): 400 mg via ORAL
  Filled 2012-10-26 (×10): qty 2

## 2012-10-26 NOTE — Progress Notes (Signed)
[name]  78 y.o.  female  Subjective: No problems overnight. Now c/o mid-substernal chest discomfort; no dyspnea; no chronic lung disease.  Allergy: Propoxyphene-acetaminophen and Simvastatin  Objective: Vital signs in last 24 hours: Temp:  [98.1 F (36.7 C)-98.4 F (36.9 C)] 98.1 F (36.7 C) (11/29 0601) Pulse Rate:  [64-68] 64  (11/29 0601) Resp:  [20] 20  (11/29 0601) BP: (138-139)/(71-78) 138/71 mmHg (11/29 0601) SpO2:  [95 %-97 %] 95 % (11/29 0601) Weight:  [75 kg (165 lb 5.5 oz)] 75 kg (165 lb 5.5 oz) (11/29 0601)  75 kg (165 lb 5.5 oz) Body mass index is 28.38 kg/(m^2).  Weight change: +1.5 kg since admit; 2.5 kg since 2 days ago Last BM Date: 10/21/12  Intake/Output since admission: +900 cc   General- Well developed; no acute distress; overweight Neck- No JVD, no carotid bruits Lungs- clear lung fields; normal I:E ratio Cardiovascular- normal PMI; normal S1 and S2; modest systolic ejection murmur Abdomen- normal bowel sounds; soft and non-tender without masses or organomegaly Skin- Warm, no significant lesions Extremities- Nl distal pulses; no edema  Pro BNP: 1966  Basename 10/25/12 0504  WBC 3.1*  HGB 8.9*  HCT 26.8*  PLT 144*   BMET:   Basename 10/26/12 0835 10/25/12 0504  NA 141 136  K 4.9 4.5  CL 110 104  CO2 23 24  GLUCOSE 140* 191*  BUN 39* 49*  CREATININE 2.09* 2.33*  CALCIUM 9.4 9.1   Hepatic Function:    Basename 10/25/12 0504  PROT 5.5*  ALBUMIN 2.8*  AST 9  ALT 9  ALKPHOS 48  BILITOT 0.1*  BILIDIR --  IBILI --   GFR:  Estimated Creatinine Clearance: 22 ml/min (by C-G formula based on Cr of 2.09). EKG:  Normal sinus rhythm; probable LVH with lateral ST-T wave abnormality; prior anterior MI, but ST segment elevation and Q waves are more prominent in leads V4-V6 than on the previous tracing performed 10/21/12.  Imaging Studies/Results: Dg Chest  10/21/2012: Atherosclerosis of the thoracic aorta.  Low lung volumes without  radiographic evidence of acute cardiopulmonary disease. 2.  Atherosclerosis.    Imaging: Imaging results have been reviewed  Medications: I have reviewed the patient's current medications. Current Facility-Administered Medications  Medication Dose Route Frequency Provider Last Rate Last Dose  . amLODipine (NORVASC) tablet 10 mg  10 mg Oral Daily Nimish C Karilyn Cota, MD   10 mg at 10/26/12 1000  . aspirin EC tablet 81 mg  81 mg Oral Daily Edsel Petrin, DO   81 mg at 10/26/12 1000  . atorvastatin (LIPITOR) tablet 40 mg  40 mg Oral q1800 Kathlen Brunswick, MD   40 mg at 10/25/12 1710  . cloNIDine (CATAPRES - Dosed in mg/24 hr) patch 0.2 mg  0.2 mg Transdermal Weekly Kathlen Brunswick, MD      . enoxaparin (LOVENOX) injection 30 mg  30 mg Subcutaneous Q24H Nimish C Karilyn Cota, MD   30 mg at 10/25/12 2255  . insulin NPH (HUMULIN N,NOVOLIN N) injection 20 Units  20 Units Subcutaneous QAC supper Wilson Singer, MD   20 Units at 10/24/12 1730  . isosorbide mononitrate (IMDUR) 24 hr tablet 60 mg  60 mg Oral Daily Edsel Petrin, DO   60 mg at 10/26/12 1000  . labetalol (NORMODYNE) tablet 300 mg  300 mg Oral BID Kathlen Brunswick, MD   300 mg at 10/26/12 1000  . nitroGLYCERIN (NITROSTAT) SL tablet 0.4 mg  0.4 mg Sublingual  Q5 Min x 3 PRN Edsel Petrin, DO      . omega-3 acid ethyl esters (LOVAZA) capsule 1 g  1 g Oral BID Jonelle Sidle, MD   1 g at 10/26/12 1000  . ondansetron (ZOFRAN) injection 4 mg  4 mg Intravenous Q6H PRN Edsel Petrin, DO      . pantoprazole (PROTONIX) EC tablet 40 mg  40 mg Oral Daily Edsel Petrin, DO   40 mg at 10/26/12 1000    Assessment/Plan: Chest pain:  Recurrent symptoms. Nature of chest discomfort is vague, and dyspnea is also a diagnostic consideration, especially in light of recent hydration.  EKG and chest x-ray are pending; sublingual nitroglycerin will be administered. With moderate risk stress nuclear study, coronary angiography continues  to appear necessary. We will plan for that study on Monday.   Hyperlipidemia: Lipid profile in 09/2012:273, 503, 39, NA.  Markedly abnormal total cholesterol in the setting of known coronary artery disease will require appropriate pharmacologic therapy-statin and omega-3 fatty acid prescribed.   Arteriosclerotic cardiovascular disease: Currently admission worrisome for acute coronary syndrome. Coronary angiography planned for Monday; renal function is about at baseline.   Acute renal failure on chronic kidney disease: Creatinine improved with modification of medical therapy. We will continue to follow.  Hypertension: Blood pressure is markedly improved, but has increased over the past 48 hours and is now somewhat suboptimal. Medical therapy will be adjusted.  Prentiss Bing, M.D. 10/26/2012, 2:24 PM

## 2012-10-26 NOTE — Progress Notes (Signed)
Subjective: Patient is upset and wants to go home. Initially reported that she had no chest pain, then reported that she did have chest pain, then stopped answering all questions.  Objective: Vital signs in last 24 hours: Filed Vitals:   10/25/12 1048 10/25/12 1341 10/25/12 2126 10/26/12 0601  BP: 153/68 146/65 139/78 138/71  Pulse: 65 67 68 64  Temp:  97.9 F (36.6 C) 98.4 F (36.9 C) 98.1 F (36.7 C)  TempSrc:  Oral Oral Oral  Resp:  20 20 20   Height:      Weight:    75 kg (165 lb 5.5 oz)  SpO2: 98% 96% 97% 95%   Weight change: 0.3 kg (10.6 oz)  Intake/Output Summary (Last 24 hours) at 10/26/12 1352 Last data filed at 10/26/12 0135  Gross per 24 hour  Intake 1143.33 ml  Output   1900 ml  Net -756.67 ml   General: Lying on her side, will not answer my questions, intermittently angry, intermittently crying. Lungs clear to auscultation bilaterally without wheeze rhonchi or rales Cardiovascular regular rate rhythm without murmurs gallops rubs Abdomen soft nontender nondistended Extremities no clubbing cyanosis or edema  Lab Results: Basic Metabolic Panel:  Lab 10/26/12 1914 10/25/12 0504  NA 141 136  K 4.9 4.5  CL 110 104  CO2 23 24  GLUCOSE 140* 191*  BUN 39* 49*  CREATININE 2.09* 2.33*  CALCIUM 9.4 9.1  MG -- --  PHOS -- --   Liver Function Tests:  Lab 10/25/12 0504 10/21/12 2111  AST 9 11  ALT 9 9  ALKPHOS 48 61  BILITOT 0.1* 0.2*  PROT 5.5* 7.2  ALBUMIN 2.8* 3.6   No results found for this basename: LIPASE:2,AMYLASE:2 in the last 168 hours No results found for this basename: AMMONIA:2 in the last 168 hours CBC:  Lab 10/25/12 0504 10/21/12 2111  WBC 3.1* 5.1  NEUTROABS -- 3.2  HGB 8.9* 12.0  HCT 26.8* 36.2  MCV 91.2 91.6  PLT 144* 217   Cardiac Enzymes:  Lab 10/22/12 1140 10/22/12 0521 10/21/12 2111  CKTOTAL -- -- --  CKMB -- -- --  CKMBINDEX -- -- --  TROPONINI <0.30 <0.30 <0.30   BNP:  Lab 10/21/12 2111  PROBNP 1968.0*    D-Dimer: No results found for this basename: DDIMER:2 in the last 168 hours CBG:  Lab 10/25/12 2125 10/25/12 1631 10/25/12 1107 10/25/12 0739 10/24/12 2137 10/24/12 1607  GLUCAP 172* 108* 162* 157* 155* 168*   Hemoglobin A1C:  Lab 10/21/12 2111  HGBA1C 6.5*   Fasting Lipid Panel:  Lab 10/22/12 0521  CHOL 273*  HDL 39*  LDLCALC UNABLE TO CALCULATE IF TRIGLYCERIDE OVER 400 mg/dL  TRIG 782*  CHOLHDL 7.0  LDLDIRECT --   Thyroid Function Tests:  Lab 10/21/12 2111  TSH 3.760  T4TOTAL --  FREET4 --  T3FREE --  THYROIDAB --    Scheduled Meds:    . amLODipine  10 mg Oral Daily  . aspirin EC  81 mg Oral Daily  . atorvastatin  40 mg Oral q1800  . cloNIDine  0.2 mg Transdermal Weekly  . enoxaparin (LOVENOX) injection  30 mg Subcutaneous Q24H  . insulin aspart  0-15 Units Subcutaneous TID WC  . insulin aspart  0-5 Units Subcutaneous QHS  . insulin NPH  20 Units Subcutaneous QAC breakfast  . insulin NPH  20 Units Subcutaneous QAC supper  . isosorbide mononitrate  60 mg Oral Daily  . labetalol  300 mg Oral BID  .  omega-3 acid ethyl esters  1 g Oral BID  . pantoprazole  40 mg Oral Daily  . sodium chloride  3 mL Intravenous Q12H   Continuous Infusions:    . sodium chloride 100 mL/hr at 10/26/12 0648   PRN Meds:.sodium chloride, acetaminophen, gi cocktail, nitroGLYCERIN, ondansetron (ZOFRAN) IV, sodium chloride Assessment/Plan:  Principal Problem:  *Chest pain Active Problems:  Hyperlipidemia  Arteriosclerotic cardiovascular disease (ASCVD)  CARDIAC MURMUR, SYSTOLIC  Cerebrovascular disease  DM (diabetes mellitus)  CKD (chronic kidney disease)  Hypertension  Unstable angina  Discussed with Dr. Karilyn Cota 11/28. Apparently, the plan was for patient to remain inpatient until her creatinine improved to the point where she could withstand cardiac catheterization, then transfer to [facility] for cath. Dr. Dietrich Pates will reportedly be making rounds today. Unless he  recommends discharge and outpatient followup, patient would have to leave AGAINST MEDICAL ADVICE if she decides to go. Discussed with patient's daughter.   LOS: 5 days   Evani Shrider L 10/26/2012, 1:52 PM

## 2012-10-27 DIAGNOSIS — M1990 Unspecified osteoarthritis, unspecified site: Secondary | ICD-10-CM

## 2012-10-27 LAB — GLUCOSE, CAPILLARY: Glucose-Capillary: 137 mg/dL — ABNORMAL HIGH (ref 70–99)

## 2012-10-27 LAB — BASIC METABOLIC PANEL
CO2: 22 mEq/L (ref 19–32)
Calcium: 9.6 mg/dL (ref 8.4–10.5)
Creatinine, Ser: 2.1 mg/dL — ABNORMAL HIGH (ref 0.50–1.10)

## 2012-10-27 MED ORDER — INSULIN NPH (HUMAN) (ISOPHANE) 100 UNIT/ML ~~LOC~~ SUSP
16.0000 [IU] | Freq: Every day | SUBCUTANEOUS | Status: DC
  Administered 2012-10-27 – 2012-10-29 (×3): 16 [IU] via SUBCUTANEOUS
  Filled 2012-10-27: qty 10

## 2012-10-27 MED ORDER — ACETAMINOPHEN 325 MG PO TABS
650.0000 mg | ORAL_TABLET | Freq: Three times a day (TID) | ORAL | Status: DC
Start: 2012-10-27 — End: 2012-10-31
  Administered 2012-10-27 – 2012-10-31 (×12): 650 mg via ORAL
  Filled 2012-10-27 (×12): qty 2

## 2012-10-27 MED ORDER — INSULIN NPH (HUMAN) (ISOPHANE) 100 UNIT/ML ~~LOC~~ SUSP
16.0000 [IU] | Freq: Every day | SUBCUTANEOUS | Status: DC
  Administered 2012-10-28: 16 [IU] via SUBCUTANEOUS
  Filled 2012-10-27: qty 10

## 2012-10-27 NOTE — Progress Notes (Signed)
Subjective: No chest pain.  No dyspnea. C/o right shoulder pain and right hip pain "from arthritis".  Objective: Vital signs in last 24 hours: Filed Vitals:   10/26/12 2107 10/26/12 2302 10/27/12 0419 10/27/12 0437  BP:    168/73  Pulse:    65  Temp:  98.8 F (37.1 C)  97 F (36.1 C)  TempSrc:  Oral  Axillary  Resp:    20  Height:      Weight:   77.7 kg (171 lb 4.8 oz)   SpO2: 93%   93%   Weight change: 2.7 kg (5 lb 15.2 oz)  Intake/Output Summary (Last 24 hours) at 10/27/12 1044 Last data filed at 10/27/12 0644  Gross per 24 hour  Intake 3053.67 ml  Output    850 ml  Net 2203.67 ml   General: In chair. Comfortable. Talking with a visitor. Much more cooperative today. Answers questions. Lungs clear to auscultation bilaterally without wheeze rhonchi or rales Cardiovascular regular rate rhythm without murmurs gallops rubs Abdomen soft nontender nondistended Extremities no clubbing cyanosis or edema Musculoskeletal: Full range of motion in the shoulder and hip. No deformities noted. No tenderness.  Lab Results: Basic Metabolic Panel:  Lab 10/27/12 9604 10/26/12 0835  NA 138 141  K 4.3 4.9  CL 108 110  CO2 22 23  GLUCOSE 82 140*  BUN 38* 39*  CREATININE 2.10* 2.09*  CALCIUM 9.6 9.4  MG -- --  PHOS -- --   Liver Function Tests:  Lab 10/25/12 0504 10/21/12 2111  AST 9 11  ALT 9 9  ALKPHOS 48 61  BILITOT 0.1* 0.2*  PROT 5.5* 7.2  ALBUMIN 2.8* 3.6   No results found for this basename: LIPASE:2,AMYLASE:2 in the last 168 hours No results found for this basename: AMMONIA:2 in the last 168 hours CBC:  Lab 10/25/12 0504 10/21/12 2111  WBC 3.1* 5.1  NEUTROABS -- 3.2  HGB 8.9* 12.0  HCT 26.8* 36.2  MCV 91.2 91.6  PLT 144* 217   Cardiac Enzymes:  Lab 10/27/12 0500 10/22/12 1140 10/22/12 0521  CKTOTAL -- -- --  CKMB -- -- --  CKMBINDEX -- -- --  TROPONINI <0.30 <0.30 <0.30   BNP:  Lab 10/26/12 0835 10/21/12 2111  PROBNP 3196.0* 1968.0*    D-Dimer: No results found for this basename: DDIMER:2 in the last 168 hours CBG:  Lab 10/27/12 0745 10/26/12 2000 10/26/12 1703 10/25/12 2125 10/25/12 1631 10/25/12 1107  GLUCAP 88 91 82 172* 108* 162*   Hemoglobin A1C:  Lab 10/21/12 2111  HGBA1C 6.5*   Fasting Lipid Panel:  Lab 10/22/12 0521  CHOL 273*  HDL 39*  LDLCALC UNABLE TO CALCULATE IF TRIGLYCERIDE OVER 400 mg/dL  TRIG 540*  CHOLHDL 7.0  LDLDIRECT --   Thyroid Function Tests:  Lab 10/21/12 2111  TSH 3.760  T4TOTAL --  FREET4 --  T3FREE --  THYROIDAB --    Scheduled Meds:    . amLODipine  10 mg Oral Daily  . aspirin EC  81 mg Oral Daily  . atorvastatin  40 mg Oral q1800  . cloNIDine  0.2 mg Transdermal Weekly  . enoxaparin (LOVENOX) injection  30 mg Subcutaneous Q24H  . insulin aspart  0-15 Units Subcutaneous TID WC  . insulin aspart  0-5 Units Subcutaneous QHS  . insulin NPH  20 Units Subcutaneous QAC breakfast  . insulin NPH  20 Units Subcutaneous QAC supper  . isosorbide mononitrate  120 mg Oral Daily  . labetalol  400  mg Oral BID  . omega-3 acid ethyl esters  1 g Oral BID  . pantoprazole  40 mg Oral Daily  . sodium chloride  3 mL Intravenous Q12H  . [DISCONTINUED] isosorbide mononitrate  60 mg Oral Daily  . [DISCONTINUED] labetalol  300 mg Oral BID   Continuous Infusions:    . sodium chloride 20 mL/hr at 10/26/12 1612  . sodium chloride     PRN Meds:.sodium chloride, acetaminophen, gi cocktail, nitroGLYCERIN, ondansetron (ZOFRAN) IV, sodium chloride Assessment/Plan:  Principal Problem:  *Chest pain Active Problems:  OA (osteoarthritis)  Hyperlipidemia  Arteriosclerotic cardiovascular disease (ASCVD)  CARDIAC MURMUR, SYSTOLIC  Cerebrovascular disease  DM (diabetes mellitus)  CKD (chronic kidney disease)  Hypertension  Unstable angina  Acute renal failure  Dr. Dietrich Pates ordered a chest x-ray and BNP yesterday. Chest x-ray shows CHF. Pro BNP greater than 3000. Admission pro BNP was  over thousand. Will saline lock IV fluid. Awaiting cardiac catheterization. Will give Tylenol for osteoarthritis. Decrease NPH to 16 units twice daily.   LOS: 6 days   [name] L 10/27/2012, 10:44 AM

## 2012-10-28 DIAGNOSIS — I472 Ventricular tachycardia: Secondary | ICD-10-CM | POA: Clinically undetermined

## 2012-10-28 LAB — GLUCOSE, CAPILLARY
Glucose-Capillary: 112 mg/dL — ABNORMAL HIGH (ref 70–99)
Glucose-Capillary: 88 mg/dL (ref 70–99)
Glucose-Capillary: 87 mg/dL (ref 70–99)
Glucose-Capillary: 157 mg/dL — ABNORMAL HIGH (ref 70–99)
Glucose-Capillary: 74 mg/dL (ref 70–99)

## 2012-10-28 LAB — BASIC METABOLIC PANEL
BUN: 40 mg/dL — ABNORMAL HIGH (ref 6–23)
Chloride: 107 mEq/L (ref 96–112)
GFR calc Af Amer: 22 mL/min — ABNORMAL LOW (ref >90)
Potassium: 4.2 mEq/L (ref 3.5–5.1)

## 2012-10-28 MED ORDER — SODIUM CHLORIDE 0.9 % IV SOLN
INTRAVENOUS | Status: DC
  Administered 2012-10-28 – 2012-10-29 (×2): via INTRAVENOUS

## 2012-10-28 NOTE — Progress Notes (Signed)
6 second run of sustained v-tach. Dr. Lendell Caprice notified and aware.

## 2012-10-28 NOTE — Progress Notes (Addendum)
Subjective: No chest pain.  No dyspnea. Shoulder and hip pain improved.  Objective: Vital signs in last 24 hours: Filed Vitals:   10/27/12 1444 10/27/12 2036 10/27/12 2120 10/28/12 0540  BP: 142/74  144/71 127/68  Pulse: 77 67 67 60  Temp: 98 F (36.7 C)  99.4 F (37.4 C) 98.2 F (36.8 C)  TempSrc: Oral  Oral Oral  Resp: 20 20 18 18   Height:      Weight:    77.6 kg (171 lb 1.2 oz)  SpO2: 92% 94% 96% 94%   Weight change: -0.1 kg (-3.5 oz)  Intake/Output Summary (Last 24 hours) at 10/28/12 1241 Last data filed at 10/28/12 0715  Gross per 24 hour  Intake      0 ml  Output    900 ml  Net   -900 ml   General: Lying flat with no evidence of labored breathing or discomfort. Lungs clear to auscultation bilaterally without wheeze rhonchi or rales Cardiovascular regular rate rhythm without murmurs gallops rubs Abdomen soft nontender nondistended Extremities no clubbing cyanosis or edema Musculoskeletal: Full range of motion in the shoulder and hip. No deformities noted. No tenderness.  Lab Results: Basic Metabolic Panel:  Lab 10/28/12 1610 10/28/12 0559 10/27/12 0500  NA -- 136 138  K -- 4.2 4.3  CL -- 107 108  CO2 -- 23 22  GLUCOSE -- 98 82  BUN -- 40* 38*  CREATININE -- 2.29* 2.10*  CALCIUM -- 9.3 9.6  MG 1.8 -- --  PHOS -- -- --   Liver Function Tests:  Lab 10/25/12 0504 10/21/12 2111  AST 9 11  ALT 9 9  ALKPHOS 48 61  BILITOT 0.1* 0.2*  PROT 5.5* 7.2  ALBUMIN 2.8* 3.6   No results found for this basename: LIPASE:2,AMYLASE:2 in the last 168 hours No results found for this basename: AMMONIA:2 in the last 168 hours CBC:  Lab 10/25/12 0504 10/21/12 2111  WBC 3.1* 5.1  NEUTROABS -- 3.2  HGB 8.9* 12.0  HCT 26.8* 36.2  MCV 91.2 91.6  PLT 144* 217   Cardiac Enzymes:  Lab 10/27/12 0500 10/22/12 1140 10/22/12 0521  CKTOTAL -- -- --  CKMB -- -- --  CKMBINDEX -- -- --  TROPONINI <0.30 <0.30 <0.30   BNP:  Lab 10/26/12 0835 10/21/12 2111  PROBNP  3196.0* 1968.0*   D-Dimer: No results found for this basename: DDIMER:2 in the last 168 hours CBG:  Lab 10/28/12 1140 10/28/12 0727 10/27/12 2122 10/27/12 1623 10/27/12 1126 10/27/12 0745  GLUCAP 145* 87 88 99 137* 88   Hemoglobin A1C:  Lab 10/21/12 2111  HGBA1C 6.5*   Fasting Lipid Panel:  Lab 10/22/12 0521  CHOL 273*  HDL 39*  LDLCALC UNABLE TO CALCULATE IF TRIGLYCERIDE OVER 400 mg/dL  TRIG 960*  CHOLHDL 7.0  LDLDIRECT --   Thyroid Function Tests:  Lab 10/21/12 2111  TSH 3.760  T4TOTAL --  FREET4 --  T3FREE --  THYROIDAB --    Scheduled Meds:    . acetaminophen  650 mg Oral TID  . amLODipine  10 mg Oral Daily  . aspirin EC  81 mg Oral Daily  . atorvastatin  40 mg Oral q1800  . cloNIDine  0.2 mg Transdermal Weekly  . enoxaparin (LOVENOX) injection  30 mg Subcutaneous Q24H  . insulin aspart  0-15 Units Subcutaneous TID WC  . insulin aspart  0-5 Units Subcutaneous QHS  . insulin NPH  16 Units Subcutaneous QAC breakfast  . insulin NPH  16 Units Subcutaneous QAC supper  . isosorbide mononitrate  120 mg Oral Daily  . labetalol  400 mg Oral BID  . omega-3 acid ethyl esters  1 g Oral BID  . pantoprazole  40 mg Oral Daily  . sodium chloride  3 mL Intravenous Q12H   Continuous Infusions:   PRN Meds:.sodium chloride, gi cocktail, nitroGLYCERIN, ondansetron (ZOFRAN) IV, sodium chloride Assessment/Plan:  Principal Problem:  *Chest pain Active Problems:  OA (osteoarthritis) improved on Tylenol  Hyperlipidemia  Arteriosclerotic cardiovascular disease (ASCVD)  CARDIAC MURMUR, SYSTOLIC  Cerebrovascular disease  DM (diabetes mellitus)  CKD (chronic kidney disease)  Hypertension  Acute renal failure NSVT:  Lytes ok  Creatinine increased a bit today. Will resume saline, as patient is not short of breath. 50 cc an hour. Will make her n.p.o. after midnight in anticipation for cardiac catheterization tomorrow. Hold a.m. NPH.   LOS: 7 days   [name]  L 10/28/2012, 12:41 PM

## 2012-10-29 ENCOUNTER — Inpatient Hospital Stay (HOSPITAL_COMMUNITY): Payer: Medicare Other

## 2012-10-29 ENCOUNTER — Encounter (HOSPITAL_COMMUNITY)
Admission: EM | Disposition: A | Discharge status: Home / Self Care | Payer: Self-pay | Source: Home / Self Care | Attending: Internal Medicine

## 2012-10-29 DIAGNOSIS — D649 Anemia, unspecified: Secondary | ICD-10-CM | POA: Clinically undetermined

## 2012-10-29 DIAGNOSIS — E162 Hypoglycemia, unspecified: Secondary | ICD-10-CM | POA: Clinically undetermined

## 2012-10-29 DIAGNOSIS — I5031 Acute diastolic (congestive) heart failure: Secondary | ICD-10-CM

## 2012-10-29 LAB — CBC
HCT: 23.4 % — ABNORMAL LOW (ref 36.0–46.0)
MCHC: 33.3 g/dL (ref 30.0–36.0)
MCV: 90.3 fL (ref 78.0–100.0)
Platelets: 157 10*3/uL (ref 150–400)
RDW: 12.6 % (ref 11.5–15.5)

## 2012-10-29 LAB — PROTIME-INR: INR: 1.1 (ref 0.00–1.49)

## 2012-10-29 LAB — BASIC METABOLIC PANEL
Chloride: 109 mEq/L (ref 96–112)
GFR calc Af Amer: 23 mL/min — ABNORMAL LOW (ref >90)
Potassium: 4.2 mEq/L (ref 3.5–5.1)

## 2012-10-29 LAB — GLUCOSE, CAPILLARY

## 2012-10-29 LAB — TYPE AND SCREEN: Antibody Screen: NEGATIVE

## 2012-10-29 SURGERY — CORONARY ANGIOGRAM
Anesthesia: Choice | Laterality: Right

## 2012-10-29 SURGERY — LEFT AND RIGHT HEART CATHETERIZATION WITH CORONARY ANGIOGRAM
Anesthesia: LOCAL

## 2012-10-29 MED ORDER — INSULIN NPH (HUMAN) (ISOPHANE) 100 UNIT/ML ~~LOC~~ SUSP
10.0000 [IU] | SUBCUTANEOUS | Status: DC
  Administered 2012-10-30 – 2012-10-31 (×2): 10 [IU] via SUBCUTANEOUS

## 2012-10-29 MED ORDER — GLUCOSE 40 % PO GEL
ORAL | Status: AC
  Administered 2012-10-29: 37.5 g
  Filled 2012-10-29: qty 0.83

## 2012-10-29 MED ORDER — FUROSEMIDE 10 MG/ML IJ SOLN
40.0000 mg | Freq: Two times a day (BID) | INTRAMUSCULAR | Status: DC
  Administered 2012-10-29 – 2012-10-31 (×4): 40 mg via INTRAVENOUS
  Filled 2012-10-29 (×4): qty 4

## 2012-10-29 NOTE — Progress Notes (Signed)
Ambulated in hallway without difficulty,with one assist.

## 2012-10-29 NOTE — Progress Notes (Addendum)
Subjective: No shortness of breath. No chest pain. Ambulated with physical therapy.  Objective: Vital signs in last 24 hours: Filed Vitals:   10/28/12 0540 10/28/12 2052 10/29/12 0411 10/29/12 1400  BP: 127/68 152/60 149/66 155/59  Pulse: 60 67 64 81  Temp: 98.2 F (36.8 C) 99.1 F (37.3 C) 98.9 F (37.2 C) 98 F (36.7 C)  TempSrc: Oral Oral Oral Oral  Resp: 18 18 18 18   Height:      Weight: 77.6 kg (171 lb 1.2 oz)  81.1 kg (178 lb 12.7 oz)   SpO2: 94% 95% 92% 95%   Weight change: 3.5 kg (7 lb 11.5 oz)  Intake/Output Summary (Last 24 hours) at 10/29/12 1857 Last data filed at 10/29/12 1825  Gross per 24 hour  Intake 2207.53 ml  Output   2550 ml  Net -342.47 ml   General: Lying flat with no evidence of labored breathing or discomfort. Lungs clear to auscultation bilaterally without wheeze rhonchi or rales Cardiovascular regular rate rhythm without murmurs gallops rubs Abdomen soft nontender nondistended Extremities no clubbing cyanosis or edema  Lab Results: Basic Metabolic Panel:  Lab 10/29/12 5621 10/28/12 1035 10/28/12 0559  NA 140 -- 136  K 4.2 -- 4.2  CL 109 -- 107  CO2 20 -- 23  GLUCOSE 67* -- 98  BUN 42* -- 40*  CREATININE 2.27* -- 2.29*  CALCIUM 9.5 -- 9.3  MG -- 1.8 --  PHOS -- -- --   Liver Function Tests:  Lab 10/25/12 0504  AST 9  ALT 9  ALKPHOS 48  BILITOT 0.1*  PROT 5.5*  ALBUMIN 2.8*   No results found for this basename: LIPASE:2,AMYLASE:2 in the last 168 hours No results found for this basename: AMMONIA:2 in the last 168 hours CBC:  Lab 10/29/12 0657 10/29/12 0518 10/25/12 0504  WBC -- 3.3* 3.1*  NEUTROABS -- -- --  HGB 8.0* 7.8* --  HCT 23.7* 23.4* --  MCV -- 90.3 91.2  PLT -- 157 144*   Cardiac Enzymes:  Lab 10/27/12 0500  CKTOTAL --  CKMB --  CKMBINDEX --  TROPONINI <0.30   BNP:  Lab 10/29/12 0518 10/26/12 0835  PROBNP 4674.0* 3196.0*   D-Dimer: No results found for this basename: DDIMER:2 in the last 168  hours CBG:  Lab 10/29/12 1625 10/29/12 1108 10/29/12 0810 10/29/12 0732 10/28/12 2001 10/28/12 1628  GLUCAP 123* 92 100* 66* 74 157*   Hemoglobin A1C: No results found for this basename: HGBA1C in the last 168 hours Fasting Lipid Panel: No results found for this basename: CHOL,HDL,LDLCALC,TRIG,CHOLHDL,LDLDIRECT in the last 308 hours Thyroid Function Tests: No results found for this basename: TSH,T4TOTAL,FREET4,T3FREE,THYROIDAB in the last 168 hours  Scheduled Meds:    . acetaminophen  650 mg Oral TID  . amLODipine  10 mg Oral Daily  . aspirin EC  81 mg Oral Daily  . atorvastatin  40 mg Oral q1800  . cloNIDine  0.2 mg Transdermal Weekly  . [COMPLETED] dextrose      . enoxaparin (LOVENOX) injection  30 mg Subcutaneous Q24H  . furosemide  40 mg Intravenous BID  . insulin aspart  0-15 Units Subcutaneous TID WC  . insulin aspart  0-5 Units Subcutaneous QHS  . insulin NPH  16 Units Subcutaneous QAC supper  . isosorbide mononitrate  120 mg Oral Daily  . labetalol  400 mg Oral BID  . omega-3 acid ethyl esters  1 g Oral BID  . pantoprazole  40 mg Oral Daily  .  sodium chloride  3 mL Intravenous Q12H   Continuous Infusions:    . [DISCONTINUED] sodium chloride 50 mL/hr at 10/29/12 1640   PRN Meds:.sodium chloride, gi cocktail, nitroGLYCERIN, ondansetron (ZOFRAN) IV, sodium chloride Assessment/Plan:  Principal Problem:  *Chest pain Active Problems:  OA (osteoarthritis) improved on Tylenol  Hyperlipidemia  Arteriosclerotic cardiovascular disease (ASCVD)  CARDIAC MURMUR, SYSTOLIC  Cerebrovascular disease  DM (diabetes mellitus)  CKD (chronic kidney disease)  Hypertension  Acute renal failure NSVT:  Lytes ok Anemia: Chronic. Patient had a hemoglobin of around 8 last year. She had heme-negative stool and unremarkable anemia panel. Patient's drop is at least in part due to hydration. Will Hemoccult stools repeat anemia panel. Cardiology has resumed diuretics. Baseline creatinine  reportedly range is 1.7-2.2. Patient is awaiting cardiac catheterization. Will decrease NPH further due to continued periodic hypoglycemia.   LOS: 8 days   Mckinnon Glick L 10/29/2012, 6:57 PM

## 2012-10-29 NOTE — Progress Notes (Signed)
INITIAL ADULT NUTRITION ASSESSMENT Date: 10/29/2012   Time: 11:28 AM Reason for Assessment: Poor po intake  ASSESSMENT: Female 77 y.o.  Dx: Chest pain   Past Medical History  Diagnosis Date  . CAD (coronary artery disease) 2003    Last catheterization 2008. Two-vessel PCI of the first OM branch and mid LAD.  Marland Kitchen CVA (cerebral vascular accident) 1996    LEFT SIDE   . IDDM (insulin dependent diabetes mellitus)   . CKD (chronic kidney disease)   . Myocardial infarction   . Congestive heart failure   . Hypercholesteremia     Scheduled Meds:   . acetaminophen  650 mg Oral TID  . amLODipine  10 mg Oral Daily  . aspirin EC  81 mg Oral Daily  . atorvastatin  40 mg Oral q1800  . cloNIDine  0.2 mg Transdermal Weekly  . [COMPLETED] dextrose      . enoxaparin (LOVENOX) injection  30 mg Subcutaneous Q24H  . insulin aspart  0-15 Units Subcutaneous TID WC  . insulin aspart  0-5 Units Subcutaneous QHS  . insulin NPH  16 Units Subcutaneous QAC supper  . isosorbide mononitrate  120 mg Oral Daily  . labetalol  400 mg Oral BID  . omega-3 acid ethyl esters  1 g Oral BID  . pantoprazole  40 mg Oral Daily  . sodium chloride  3 mL Intravenous Q12H  . [DISCONTINUED] insulin NPH  16 Units Subcutaneous QAC breakfast   Continuous Infusions:   . sodium chloride 50 mL/hr at 10/28/12 1300   PRN Meds:.sodium chloride, gi cocktail, nitroGLYCERIN, ondansetron (ZOFRAN) IV, sodium chloride  Ht: 5\' 4"  (162.6 cm)  Wt: 178 lb 12.7 oz (81.1 kg)  Ideal Wt: 54.7 kg  % Ideal Wt: 148%  Usual Wt: 162# 10/21/12 Pt is on daily wt monitoring. Wt Readings from Last 10 Encounters:  10/29/12 178 lb 12.7 oz (81.1 kg)  10/29/12 178 lb 12.7 oz (81.1 kg)  12/07/11 164 lb (74.39 kg)  10/26/11 174 lb (78.926 kg)  09/15/11 171 lb (77.565 kg)  11/10/10 165 lb (74.844 kg)  10/07/10 164 lb (74.39 kg)  09/15/10 162 lb (73.483 kg)  08/04/10 158 lb (71.668 kg)  07/20/10 163 lb (73.936 kg)     Body mass index is  30.69 kg/(m^2). Pt BMI-27.81 admission. Current BMI value likely inflated due to fluid changes?  Food/Nutrition Related Hx: pt chart review shows severe wt gain of 16 #, 10% x 8 days. Elevated lipids. Her appetite and po intake has deteriorated since admission and has become inadequate over past 4 days. 100% of peaches and ice cream last night and prior to that she has refused some meals. NPO  After MN for cardiac catherization but that is being held and diet has been advanced back to CHO  modified. Discussed with pt the importance of adequate oral intake. She refuses Glucerna but is agreeable to  milk with meals. She contributes her poor meal intake to being "here" says that she eats good when at home.     CMP     Component Value Date/Time   NA 140 10/29/2012 0518   K 4.2 10/29/2012 0518   CL 109 10/29/2012 0518   CO2 20 10/29/2012 0518   GLUCOSE 67* 10/29/2012 0518   BUN 42* 10/29/2012 0518   CREATININE 2.27* 10/29/2012 0518   CALCIUM 9.5 10/29/2012 0518   PROT 5.5* 10/25/2012 0504   ALBUMIN 2.8* 10/25/2012 0504   AST 9 10/25/2012 0504   ALT 9  10/25/2012 0504   ALKPHOS 48 10/25/2012 0504   BILITOT 0.1* 10/25/2012 0504   GFRNONAA 20* 10/29/2012 0518   GFRAA 23* 10/29/2012 0518    Intake/Output Summary (Last 24 hours) at 10/29/12 1143 Last data filed at 10/29/12 0800  Gross per 24 hour  Intake 1236.7 ml  Output   1200 ml  Net   36.7 ml     Diet Order: Carb Control   IVF:    sodium chloride Last Rate: 50 mL/hr at 10/28/12 1300    Estimated Nutritional Needs:   Kcal:1500-1760 kcal/day Protein:80-88 gr/day Fluid:1 ml/kcal   NUTRITION DIAGNOSIS: -Inadequate oral intake (NI-2.1).  Status: Ongoing  RELATED TO: decreased appetite  AS EVIDENCE BY: pt refusing meals and limited food choices (ice cream, pudding)  MONITORING/EVALUATION(Goals): Monitor meals and wt changes Goal: Pt to meet >/= 90% of their estimated nutrition needs; not met  EDUCATION NEEDS: -Education needs  addressed  INTERVENTION: -Milk with meals (low fat)  Dietitian [phone]  DOCUMENTATION CODES Per approved criteria  -Not Applicable    Francene Boyers 10/29/2012, 11:28 AM

## 2012-10-29 NOTE — Progress Notes (Signed)
[name]  78 y.o.  female  Subjective: Patient reports dyspnea during a brief walk down the hall. Denies orthopnea or PND. Not experiencing chest discomfort.  Allergy: Propoxyphene-acetaminophen and Simvastatin  Objective: Vital signs in last 24 hours: Temp:  [98 F (36.7 C)-99.1 F (37.3 C)] 98 F (36.7 C) (12/02 1400) Pulse Rate:  [64-81] 81  (12/02 1400) Resp:  [18] 18  (12/02 1400) BP: (149-155)/(59-66) 155/59 mmHg (12/02 1400) SpO2:  [92 %-95 %] 95 % (12/02 1400) Weight:  [81.1 kg (178 lb 12.7 oz)] 81.1 kg (178 lb 12.7 oz) (12/02 0411)  81.1 kg (178 lb 12.7 oz) Body mass index is 30.69 kg/(m^2).  Weight change: 3.5 kg (7 lb 11.5 oz) Last BM Date: 10/28/12 Weight increased 7 kg since admission, 6 kg over the past 2 days.  Intake/Output from previous day: 12/01 0701 - 12/02 0700 In: 1236.7 [P.O.:600; I.V.:636.7] Out: 1500 [Urine:1500] Total I&O since admission: +2.3 L General- Well developed; no acute distress Neck- mild JVD, no carotid bruits Lungs- modest basilar rales; decreased breath sounds with dullness to percussion at the bases; normal I:E ratio Cardiovascular- normal PMI; normal S1 and S2; S4 present; modest systolic murmur Abdomen- normal bowel sounds; soft and non-tender without masses or organomegaly Skin- Warm, no significant lesions Extremities- Nl distal pulses; 1/2+ pedal and 1+ presacral edema  Basename 10/29/12 0657 10/29/12 0518  WBC -- 3.3*  HGB 8.0* 7.8*  HCT 23.7* 23.4*  PLT -- 157   BMET:  Basename 10/29/12 0518 10/28/12 0559  NA 140 136  K 4.2 4.2  CL 109 107  CO2 20 23  GLUCOSE 67* 98  BUN 42* 40*  CREATININE 2.27* 2.29*  CALCIUM 9.5 9.3   EKG:  10/26/12-normal sinus rhythm; first-degree AV block; prior anterior myocardial infarction with residual ST segment elevation; IVCD; no change compared with previous tracing performed 10/24/12  Imaging Studies/Results: Dg Chest Port 10/29/2012: Mild cardiomegaly, bibasilar airspace disease  more prominent on the right, bibasilar atelectasis, tortuous aorta, small bilateral effusions   Echocardiogram: 10/24/12-Preserved left ventricular systolic function with ejection fraction of approximately 65%; no significant valvular abnormalities.  Imaging: Imaging results have been reviewed  Medications:  I have reviewed the patient's current medications.   Marland Kitchen acetaminophen  650 mg Oral TID  . amLODipine  10 mg Oral Daily  . aspirin EC  81 mg Oral Daily  . atorvastatin  40 mg Oral q1800  . cloNIDine  0.2 mg Transdermal Weekly  . enoxaparin (LOVENOX)   30 mg Subcutaneous Q24H  . insulin aspart  0-15 Units Subcutaneous TID WC  . insulin NPH  10 Units Subcutaneous BH-qamhs  . isosorbide mononitrate  120 mg Oral Daily  . labetalol  400 mg Oral BID  . omega-3 acid ethyl esters  1 g Oral BID  . pantoprazole  40 mg Oral Daily    Assessment/Plan: Congestive heart failure has been present since 11/29. Patient continues to receive 50 cc of normal saline per hour, which will be discontinued. Treatment with furosemide will be resumed. Cardiac catheterization will be deferred until patient is more stable, but recovery of renal function does not appear likely.  Hypertension is fairly well controlled with systolics 130-150 mmHg end diastolic well less than 80.  Congestive heart failure is acute diastolic based upon a normal ejection fraction a few days ago and likely the result of fluid administration, renal insufficiency and hypertensive heart disease.  Moderately severe anemia with normal iron studies in the past. Stool for  Hemoccult testing is pending. May all be secondary to chronic kidney disease. Nephrology consultation would be helpful. Treatment with an erythropoietin analog may be necessary.  [name] 10/29/2012, 8:26 PM

## 2012-10-29 NOTE — Progress Notes (Signed)
Hypoglycemic Event  CBG: 66  Treatment: 1 tube instant glucose  Symptoms: None  Follow-up CBG: Time:0807 CBG Result:100  Possible Reasons for Event: Inadequate meal intake  Comments/MD notified: Dr Lendell Caprice, notified.    [name]  Remember to initiate Hypoglycemia Order Set & complete

## 2012-10-29 NOTE — Progress Notes (Signed)
Cardiac Lab called at [facility] this am,patient scheduled for Coronary Angiography was informed by Armandina Stammer RN that the procedure would be on hold due to abnormal labs,Dr  Lendell Caprice notified,and Dr Dietrich Pates  Informed.

## 2012-10-29 NOTE — Progress Notes (Signed)
Utilization Review Complete  

## 2012-10-30 ENCOUNTER — Inpatient Hospital Stay (HOSPITAL_COMMUNITY): Payer: Medicare Other

## 2012-10-30 DIAGNOSIS — I214 Non-ST elevation (NSTEMI) myocardial infarction: Secondary | ICD-10-CM

## 2012-10-30 DIAGNOSIS — I509 Heart failure, unspecified: Secondary | ICD-10-CM

## 2012-10-30 DIAGNOSIS — D649 Anemia, unspecified: Secondary | ICD-10-CM

## 2012-10-30 DIAGNOSIS — I2510 Atherosclerotic heart disease of native coronary artery without angina pectoris: Secondary | ICD-10-CM

## 2012-10-30 DIAGNOSIS — I5031 Acute diastolic (congestive) heart failure: Secondary | ICD-10-CM

## 2012-10-30 LAB — GLUCOSE, CAPILLARY
Glucose-Capillary: 136 mg/dL — ABNORMAL HIGH (ref 70–99)
Glucose-Capillary: 156 mg/dL — ABNORMAL HIGH (ref 70–99)
Glucose-Capillary: 116 mg/dL — ABNORMAL HIGH (ref 70–99)

## 2012-10-30 LAB — BASIC METABOLIC PANEL
Chloride: 108 mEq/L (ref 96–112)
GFR calc Af Amer: 20 mL/min — ABNORMAL LOW (ref >90)
Potassium: 4.2 mEq/L (ref 3.5–5.1)

## 2012-10-30 LAB — HEMOGLOBIN AND HEMATOCRIT, BLOOD
HCT: 26 % — ABNORMAL LOW (ref 36.0–46.0)
Hemoglobin: 8.7 g/dL — ABNORMAL LOW (ref 12.0–15.0)

## 2012-10-30 LAB — FOLATE: Folate: 15.3 ng/mL

## 2012-10-30 LAB — IRON AND TIBC
Iron: 25 ug/dL — ABNORMAL LOW (ref 42–135)
TIBC: 184 ug/dL — ABNORMAL LOW (ref 250–470)

## 2012-10-30 LAB — VITAMIN B12: Vitamin B-12: 256 pg/mL (ref 211–911)

## 2012-10-30 MED ORDER — POLYSACCHARIDE IRON COMPLEX 150 MG PO CAPS
150.0000 mg | ORAL_CAPSULE | Freq: Two times a day (BID) | ORAL | Status: DC
  Administered 2012-10-30 – 2012-10-31 (×2): 150 mg via ORAL
  Filled 2012-10-30 (×2): qty 1

## 2012-10-30 NOTE — Progress Notes (Addendum)
     Subjective: This lady has no symptoms whatsoever at the present time. She denies any dyspnea, chest pain or palpitations. Apparently in the early hours of this morning she had asymptomatic V. tach.           Physical Exam: Blood pressure 160/75, pulse 64, temperature 98 F (36.7 C), temperature source Oral, resp. rate 19, height 5\' 4"  (1.626 m), weight 77.4 kg (170 lb 10.2 oz), SpO2 95.00%. She does look systemically well. There is no increased work of breathing at rest. Heart sounds are present and appear to be in sinus rhythm. Lung fields are clear except for some crackles in the right lower zone. Jugular venous pressure not elevated. She does not appear to have any significant peripheral pitting edema in her legs. She is alert and orientated.   Investigations:     Basic Metabolic Panel:  Basename 10/30/12 0504 10/29/12 0518 10/28/12 1035  NA 140 140 --  K 4.2 4.2 --  CL 108 109 --  CO2 23 20 --  GLUCOSE 87 67* --  BUN 42* 42* --  CREATININE 2.48* 2.27* --  CALCIUM 9.6 9.5 --  MG -- -- 1.8  PHOS -- -- --       CBC:  Basename 10/29/12 0657 10/29/12 0518  WBC -- 3.3*  NEUTROABS -- --  HGB 8.0* 7.8*  HCT 23.7* 23.4*  MCV -- 90.3  PLT -- 157    Dg Chest Port 1 View  10/29/2012  *RADIOLOGY REPORT*  Clinical Data: Short of breath.  CHF.  PORTABLE CHEST - 1 VIEW  Comparison: 10/26/2012.  Findings: Mild cardiomegaly.  Basilar predominant right greater than left airspace disease is present, most compatible with asymmetric/atypical pulmonary edema.  Basilar atelectasis is also present.  Tortuous thoracic aorta. Monitoring leads are projected over the chest.  Small bilateral pleural effusions are likely present.  IMPRESSION: Mild moderate CHF with right greater than left basilar airspace disease, most compatible with asymmetric pulmonary edema; pneumonia considered less likely.   Original Report Authenticated By: Andreas Newport, M.D.       Medications: I have  reviewed the patient's current medications.  Impression: 1. Acute on chronic diastolic congestive heart failure, clinically compensated. 2. Worsening renal function, acute on chronic kidney disease, in part related to use of intravenous diuretics. 3. Ischemic heart disease with positive stress test. 4. Type 2 diabetes mellitus. 5. Hypertension. 6. Anemia, likely of chronic disease and chronic kidney disease.     Plan: 1. Continue with current therapy. 2. Repeat hemoglobin this morning. Transfuse with blood if hemoglobin less than 8. 3. Await cardiology recommendations. I think that we need to make a decision whether this lady needs to be transferred to [facility] or discharge home. The patient herself is very frustrated and wants to go home.     LOS: 9 days   Wilson Singer Pager [phone]  10/30/2012, 10:37 AM

## 2012-10-30 NOTE — Progress Notes (Signed)
At 0122, Patient had a 30-40 beat run of V tach. Patient was sleeping, and all the leads were on. Patient was woken up and was alert and oriented. BP was 144/66 and HR 59. Oxygen was 96% on 2L. Doctor was notified and no new orders given. Will continue to monitor patient. Telemetry was printed out and put in the patient's chart.

## 2012-10-30 NOTE — Consult Note (Signed)
Reason for Consult: Renal failure Referring Physician: [name] is an 78 y.o. female.  HPI: She is a patient was history of diabetes, chronic renal failure stage IV presently had came to the hospital because of epigastric chest pain. According to the patient she was having severe pain last week around epigastric area she took a sublingual nitroglycerin improved but then the pain came back. Hence patient is admitted for chest pain. Presently consult is called because of her elevated pending creatinine. Presently patient denies any nausea vomiting. [name] is his known to me and that she's being followed as an outpatient for her chronic renal failure.  Past Medical History  Diagnosis Date  . CAD (coronary artery disease) 2003    Last catheterization 2008. Two-vessel PCI of the first OM branch and mid LAD.  Marland Kitchen CVA (cerebral vascular accident) 1996    LEFT SIDE   . IDDM (insulin dependent diabetes mellitus)   . CKD (chronic kidney disease)   . Myocardial infarction   . Congestive heart failure   . Hypercholesteremia     Past Surgical History  Procedure Date  . Appendectomy   . Knee surgery     Family History  Problem Relation Age of Onset  . Diabetes Brother     RETINOPATHY   . Drug abuse Brother   . Liver cancer Brother   . Coronary artery disease Mother   . Breast cancer Daughter   . Heart attack Mother   . Heart attack Father   . Coronary artery disease Father   . Diabetes Sister     Social History:  reports that she has never smoked. She does not have any smokeless tobacco history on file. She reports that she does not drink alcohol or use illicit drugs.  Allergies:  Allergies  Allergen Reactions  . Propoxyphene-Acetaminophen   . Simvastatin Other (See Comments)    headache    Medications: I have reviewed the patient's current medications.  Results for orders placed during the hospital encounter of 10/21/12 (from the past 48 hour(s))  GLUCOSE,  CAPILLARY     Status: Abnormal   Collection Time   10/28/12  4:28 PM      Component Value Range Comment   Glucose-Capillary 157 (*) 70 - 99 mg/dL    Comment 1 Notify RN     GLUCOSE, CAPILLARY     Status: Normal   Collection Time   10/28/12  8:01 PM      Component Value Range Comment   Glucose-Capillary 74  70 - 99 mg/dL   BASIC METABOLIC PANEL     Status: Abnormal   Collection Time   10/29/12  5:18 AM      Component Value Range Comment   Sodium 140  135 - 145 mEq/L    Potassium 4.2  3.5 - 5.1 mEq/L    Chloride 109  96 - 112 mEq/L    CO2 20  19 - 32 mEq/L    Glucose, Bld 67 (*) 70 - 99 mg/dL    BUN 42 (*) 6 - 23 mg/dL    Creatinine, Ser 4.54 (*) 0.50 - 1.10 mg/dL    Calcium 9.5  8.4 - 09.8 mg/dL    GFR calc non Af Amer 20 (*) >90 mL/min    GFR calc Af Amer 23 (*) >90 mL/min   CBC     Status: Abnormal   Collection Time   10/29/12  5:18 AM      Component Value  Range Comment   WBC 3.3 (*) 4.0 - 10.5 K/uL    RBC 2.59 (*) 3.87 - 5.11 MIL/uL    Hemoglobin 7.8 (*) 12.0 - 15.0 g/dL    HCT 40.9 (*) 81.1 - 46.0 %    MCV 90.3  78.0 - 100.0 fL    MCH 30.1  26.0 - 34.0 pg    MCHC 33.3  30.0 - 36.0 g/dL    RDW 91.4  78.2 - 95.6 %    Platelets 157  150 - 400 K/uL   PROTIME-INR     Status: Normal   Collection Time   10/29/12  5:18 AM      Component Value Range Comment   Prothrombin Time 14.1  11.6 - 15.2 seconds    INR 1.10  0.00 - 1.49   PRO B NATRIURETIC PEPTIDE     Status: Abnormal   Collection Time   10/29/12  5:18 AM      Component Value Range Comment   Pro B Natriuretic peptide (BNP) 4674.0 (*) 0 - 450 pg/mL   HEMOGLOBIN AND HEMATOCRIT, BLOOD     Status: Abnormal   Collection Time   10/29/12  6:57 AM      Component Value Range Comment   Hemoglobin 8.0 (*) 12.0 - 15.0 g/dL    HCT 21.3 (*) 08.6 - 46.0 %   TYPE AND SCREEN     Status: Normal   Collection Time   10/29/12  6:57 AM      Component Value Range Comment   ABO/RH(D) A POS      Antibody Screen NEG      Sample Expiration  11/01/2012     GLUCOSE, CAPILLARY     Status: Abnormal   Collection Time   10/29/12  7:32 AM      Component Value Range Comment   Glucose-Capillary 66 (*) 70 - 99 mg/dL    Comment 1 Documented in Chart      Comment 2 Notify RN     GLUCOSE, CAPILLARY     Status: Abnormal   Collection Time   10/29/12  8:10 AM      Component Value Range Comment   Glucose-Capillary 100 (*) 70 - 99 mg/dL   GLUCOSE, CAPILLARY     Status: Normal   Collection Time   10/29/12 11:08 AM      Component Value Range Comment   Glucose-Capillary 92  70 - 99 mg/dL    Comment 1 Documented in Chart      Comment 2 Notify RN     GLUCOSE, CAPILLARY     Status: Abnormal   Collection Time   10/29/12  4:25 PM      Component Value Range Comment   Glucose-Capillary 123 (*) 70 - 99 mg/dL    Comment 1 Documented in Chart      Comment 2 Notify RN     GLUCOSE, CAPILLARY     Status: Abnormal   Collection Time   10/29/12  8:48 PM      Component Value Range Comment   Glucose-Capillary 107 (*) 70 - 99 mg/dL    Comment 1 Notify RN     BASIC METABOLIC PANEL     Status: Abnormal   Collection Time   10/30/12  5:04 AM      Component Value Range Comment   Sodium 140  135 - 145 mEq/L    Potassium 4.2  3.5 - 5.1 mEq/L    Chloride 108  96 - 112 mEq/L  CO2 23  19 - 32 mEq/L    Glucose, Bld 87  70 - 99 mg/dL    BUN 42 (*) 6 - 23 mg/dL    Creatinine, Ser 0.98 (*) 0.50 - 1.10 mg/dL    Calcium 9.6  8.4 - 11.9 mg/dL    GFR calc non Af Amer 18 (*) >90 mL/min    GFR calc Af Amer 20 (*) >90 mL/min   VITAMIN B12     Status: Normal   Collection Time   10/30/12  5:04 AM      Component Value Range Comment   Vitamin B-12 256  211 - 911 pg/mL   FOLATE     Status: Normal   Collection Time   10/30/12  5:04 AM      Component Value Range Comment   Folate 15.3     IRON AND TIBC     Status: Abnormal   Collection Time   10/30/12  5:04 AM      Component Value Range Comment   Iron 25 (*) 42 - 135 ug/dL    TIBC 147 (*) 829 - 562 ug/dL     Saturation Ratios 14 (*) 20 - 55 %    UIBC 159  125 - 400 ug/dL   FERRITIN     Status: Normal   Collection Time   10/30/12  5:04 AM      Component Value Range Comment   Ferritin 215  10 - 291 ng/mL   GLUCOSE, CAPILLARY     Status: Abnormal   Collection Time   10/30/12  7:28 AM      Component Value Range Comment   Glucose-Capillary 106 (*) 70 - 99 mg/dL    Comment 1 Documented in Chart      Comment 2 Notify RN     HEMOGLOBIN AND HEMATOCRIT, BLOOD     Status: Abnormal   Collection Time   10/30/12 10:52 AM      Component Value Range Comment   Hemoglobin 8.7 (*) 12.0 - 15.0 g/dL    HCT 13.0 (*) 86.5 - 46.0 %   GLUCOSE, CAPILLARY     Status: Abnormal   Collection Time   10/30/12 11:32 AM      Component Value Range Comment   Glucose-Capillary 136 (*) 70 - 99 mg/dL    Comment 1 Documented in Chart      Comment 2 Notify RN       Dg Chest Port 1 View  10/30/2012  *RADIOLOGY REPORT*  Clinical Data: Follow-up CHF.  PORTABLE CHEST - 1 VIEW  Comparison: 10/29/2012  Findings: Mild cardiomegaly.  Bibasilar airspace opacities have increased slightly since prior study, which represent worsening atelectasis and/or edema.  Small bilateral effusions.  No acute bony abnormality.  IMPRESSION: Increasing bibasilar airspace opacities with small effusions.  This could represent worsening edema and/or atelectasis.   Original Report Authenticated By: Charlett Nose, M.D.    Dg Chest Port 1 View  10/29/2012  *RADIOLOGY REPORT*  Clinical Data: Short of breath.  CHF.  PORTABLE CHEST - 1 VIEW  Comparison: 10/26/2012.  Findings: Mild cardiomegaly.  Basilar predominant right greater than left airspace disease is present, most compatible with asymmetric/atypical pulmonary edema.  Basilar atelectasis is also present.  Tortuous thoracic aorta. Monitoring leads are projected over the chest.  Small bilateral pleural effusions are likely present.  IMPRESSION: Mild moderate CHF with right greater than left basilar airspace  disease, most compatible with asymmetric pulmonary edema; pneumonia considered less likely.  Original Report Authenticated By: Andreas Newport, M.D.     Review of Systems  Respiratory: Negative for sputum production and shortness of breath.   Cardiovascular: Positive for chest pain. Negative for orthopnea, claudication and leg swelling.  Gastrointestinal: Positive for heartburn. Negative for nausea, vomiting and diarrhea.  Neurological: Positive for weakness.   Blood pressure 128/50, pulse 57, temperature 97.8 F (36.6 C), temperature source Oral, resp. rate 18, height 5\' 4"  (1.626 m), weight 77.4 kg (170 lb 10.2 oz), SpO2 98.00%. Physical Exam  Eyes: Left eye exhibits no discharge. No scleral icterus.  Neck: No JVD present.  Cardiovascular: Normal rate and regular rhythm.   No murmur heard. Respiratory: No respiratory distress. She has no wheezes.  GI: She exhibits no distension. There is no tenderness.  Musculoskeletal: She exhibits no edema.    Assessment/Plan: Problem #1 renal failure chronic her BUN and creatinine at this moment seems to be stable. Patient baseline creatinine range is between low 2's to high2's. Presently this has seems to be more or less her baseline and patient does not have any uremic sign symptoms. Her potassium is good. Problem #2 chest pain presently has improved. Possibly her cardiac as patient has multiple risk factors. Problem #3 history of hypertension blood pressure seems to be reasonably controlled Problem #4 nonsustained ventricular tachycardia Problem #5 history of CVA Problem #6 history of diabetes Problem #7 history of diastolic dysfunction Problem #8 anemia patient her iron saturation is low but ferritin is high. Possibly combination of anemia of iron deficiency and chronic disease. Plan: Nu-Iron 150 mg by mouth twice a day We'll check her phosphorus and basic metabolic panel in the morning If patient is going to have cardiac catheterization  because of history of diabetes, chronic renal failure and CHF there is a chance her renal function will get worst. And I have explained to her in detail. Once her iron saturation improves possibly patient will benefit from Epogen.  [name] S 10/30/2012, 4:07 PM

## 2012-10-30 NOTE — Progress Notes (Signed)
[name]  78 y.o.  female  Subjective:  Denies orthopnea or PND. Not experiencing chest discomfort.  Allergy: Propoxyphene-acetaminophen and Simvastatin  Objective: Vital signs in last 24 hours: Temp:  [97.8 F (36.6 C)-98.1 F (36.7 C)] 97.8 F (36.6 C) (12/03 1321) Pulse Rate:  [57-70] 57  (12/03 1321) Resp:  [18-19] 18  (12/03 1321) BP: (128-188)/(50-75) 128/50 mmHg (12/03 1321) SpO2:  [93 %-98 %] 98 % (12/03 1321) Weight:  [77.4 kg (170 lb 10.2 oz)] 77.4 kg (170 lb 10.2 oz) (12/03 0551)  77.4 kg (170 lb 10.2 oz) Body mass index is 29.29 kg/(m^2).  Weight change: -3.7 kg (-8 lb 2.5 oz) Last BM Date: 10/27/12 Weight: Decreased 4 kg overnight  Intake/Output from previous day: 12/02 0701 - 12/03 0700 In: 1240.8 [P.O.:630; I.V.:610.8] Out: 2950 [Urine:2950] Total I&O since admission: -500 cc; -3 L over the past 3 days General- Well developed; no acute distress Neck- mild JVD, no carotid bruits Lungs- modest basilar rales; decreased breath sounds with dullness to percussion at the bases; normal I:E ratio Cardiovascular- normal PMI; normal S1 and S2; S4 present; modest systolic murmur Abdomen- normal bowel sounds; soft and non-tender without masses or organomegaly Skin- Warm, no significant lesions Extremities- Nl distal pulses; 1/2+ pedal and 1+ presacral edema  Basename 10/30/12 1052 10/29/12 0657 10/29/12 0518  WBC -- -- 3.3*  HGB 8.7* 8.0* --  HCT 26.0* 23.7* --  PLT -- -- 157   BMET:   Basename 10/30/12 0504 10/29/12 0518  NA 140 140  K 4.2 4.2  CL 108 109  CO2 23 20  GLUCOSE 87 67*  BUN 42* 42*  CREATININE 2.48* 2.27*  CALCIUM 9.6 9.5   EKG:  10/26/12-normal sinus rhythm; first-degree AV block; prior anterior myocardial infarction with residual ST segment elevation; IVCD; no change compared with previous tracing performed 10/24/12  Imaging Studies/Results: Dg Chest Port 10/30/2012: Mild cardiomegaly, bibasilar airspace disease-increased since previous  day; bibasilar atelectasis, tortuous aorta, small bilateral effusions   Echocardiogram: 10/24/12-Preserved left ventricular systolic function with ejection fraction of approximately 65%; no significant valvular abnormalities.  Imaging: Imaging results have been reviewed  Medications:  I have reviewed the patient's current medications.   Marland Kitchen acetaminophen  650 mg Oral TID  . amLODipine  10 mg Oral Daily  . aspirin EC  81 mg Oral Daily  . atorvastatin  40 mg Oral q1800  . cloNIDine  0.2 mg Transdermal Weekly  . enoxaparin (LOVENOX)   30 mg Subcutaneous Q24H  . insulin aspart  0-15 Units Subcutaneous TID WC  . insulin NPH  10 Units Subcutaneous BH-qamhs  . isosorbide mononitrate  120 mg Oral Daily  . labetalol  400 mg Oral BID  . omega-3 acid ethyl esters  1 g Oral BID    furosemide   40 mg   IV   twice a day   . pantoprazole  40 mg Oral Daily    Assessment/Plan: Arteriosclerotic cardiovascular disease (ASCVD): Non-ST segment elevation myocardial infarction; moderate to high risk stress test; attempting to control congestive heart failure and acute on chronic renal disease to allow catheterization to proceed with relative safety.  Hypertension: Blood pressure relatively well controlled except for a single measurement of 190 systolic; all diastolic pressures well less than 80.  Diastolic congestive heart failure: Clinically improved with moderate diuresis, but chest x-ray has worsened; hopefully, this is simply a lagging clinical indicator.  Most recent proBNP level of 4674 is well above typical value of 1500-2500.  Chronic kidney disease: Stage III-IV,  exacerbated by diuresis, but weight continues to be about baseline, and pulmonary edema persists. Continue IV furosemide one more day, then change to 40 mg orally per day.  Arrhythmia: Available tracings shows only artifact, not ventricular tachycardia.  Patient continues to request discharge. Although adjustment of medical therapy in  hospital is the safest course, we can try to accomplish this from the office. If creatinine is not markedly increased tomorrow, and additional diuresis is achieved over the course of the morning and/or weight is approaching baseline, she can be switched to oral furosemide, we can followup closely as an outpatient, and hopefully arrange for outpatient catheterization within the next week or so.  Fullerton Bing, M.D. 10/30/2012, 6:47 PM

## 2012-10-31 DIAGNOSIS — N179 Acute kidney failure, unspecified: Secondary | ICD-10-CM

## 2012-10-31 LAB — COMPREHENSIVE METABOLIC PANEL
ALT: 12 U/L (ref 0–35)
AST: 11 U/L (ref 0–37)
Calcium: 10.1 mg/dL (ref 8.4–10.5)
Potassium: 4.4 mEq/L (ref 3.5–5.1)
Sodium: 138 mEq/L (ref 135–145)
Total Protein: 6.2 g/dL (ref 6.0–8.3)

## 2012-10-31 LAB — CBC
MCH: 30.4 pg (ref 26.0–34.0)
MCHC: 33.7 g/dL (ref 30.0–36.0)
Platelets: 224 10*3/uL (ref 150–400)
RBC: 2.73 MIL/uL — ABNORMAL LOW (ref 3.87–5.11)

## 2012-10-31 LAB — GLUCOSE, CAPILLARY: Glucose-Capillary: 141 mg/dL — ABNORMAL HIGH (ref 70–99)

## 2012-10-31 MED ORDER — LABETALOL HCL 200 MG PO TABS: 400.0000 mg | ORAL_TABLET | Freq: Two times a day (BID) | ORAL | Status: DC

## 2012-10-31 MED ORDER — OMEGA-3-ACID ETHYL ESTERS 1 G PO CAPS: 1.0000 g | ORAL_CAPSULE | Freq: Two times a day (BID) | ORAL | Status: DC

## 2012-10-31 MED ORDER — AMLODIPINE BESYLATE 10 MG PO TABS: 10.0000 mg | ORAL_TABLET | Freq: Every day | ORAL | Status: DC

## 2012-10-31 MED ORDER — ISOSORBIDE MONONITRATE ER 120 MG PO TB24: 120.0000 mg | ORAL_TABLET | Freq: Every day | ORAL | Status: DC

## 2012-10-31 MED ORDER — POLYSACCHARIDE IRON COMPLEX 150 MG PO CAPS: 150.0000 mg | ORAL_CAPSULE | Freq: Two times a day (BID) | ORAL | Status: DC

## 2012-10-31 MED ORDER — CLONIDINE HCL 0.2 MG/24HR TD PTWK: 1.0000 | MEDICATED_PATCH | TRANSDERMAL | Status: DC

## 2012-10-31 MED ORDER — NITROGLYCERIN 0.4 MG SL SUBL: 0.4000 mg | SUBLINGUAL_TABLET | SUBLINGUAL | Status: DC | PRN

## 2012-10-31 MED ORDER — FUROSEMIDE 40 MG PO TABS: 40.0000 mg | ORAL_TABLET | Freq: Every day | ORAL | Status: DC

## 2012-10-31 NOTE — Progress Notes (Signed)
UR chart review completed.  

## 2012-10-31 NOTE — Progress Notes (Signed)
Discharge Summary: a/o.vss. Saline lock removed. Up ad lib. Discharge instructions given. Prescriptions given. Pt verbalized understanding of instructions. Left floor via wheelchair with nursing staff and family member.

## 2012-10-31 NOTE — Discharge Summary (Signed)
Physician Discharge Summary  [name] YQM:[id number] DOB: 01/26/1932 DOA: 10/21/2012  PCP: Horald Pollen., PA  Admit date: 10/21/2012 Discharge date: 10/31/2012  Time spent: Greater than 30 minutes  Recommendations for Outpatient Follow-up:  1. Followup with Dr. Dietrich Pates, cardiology within a week.   Discharge Diagnoses:  1. Acute on chronic diastolic congestive heart failure, clinically compensated. 2. Acute on chronic kidney disease, stable. 3. Ischemic heart disease with positive stress test. 4. Type 2 diabetes mellitus. 5. Hypertension. 6. Anemia of chronic disease and chronic kidney disease.   Discharge Condition: Stable and improved.  Diet recommendation: Carbohydrate modified diet.  Filed Weights   10/29/12 0411 10/30/12 0551 10/31/12 0440  Weight: 81.1 kg (178 lb 12.7 oz) 77.4 kg (170 lb 10.2 oz) 70.3 kg (154 lb 15.7 oz)    History of present illness:  This very pleasant 78 year old lady presents to the hospital with symptoms of chest pain. Please see initial history as outlined below: HPI: [name] is a 78 y.o. female With past medical history significant for hypertension, diastolic heart failure, and coronary artery disease status post PCI in 2012 who presents for  evaluation of chest pain. Pain is described as "burning", substernal, associated with mild diaphoresis and shortness of breath. Does not radiate, no pressure or palpitations. No association with food intake or indigestion. The pain has been intermittent for 2-3 days, she usually takes NTG and it goes away except for this evening- she took NTG SL X2 w/o relief and decided to come in for evaluation. No CP while in ED. No signs of ischemia on EKG, first troponin was negative. She is hypertensive but reports having taking her daily BP medications.  Hospital Course:  The patient was admitted and serial cardiac enzymes were performed. These were all negative. However because her history was impressive,  she underwent a nuclear medicine stress test. This was positive. She was seen by cardiology, Dr. Dietrich Pates. He recommended cardiac catheterization. Unfortunately the patient's renal function was not optimal. She was hydrated gently. This did improve her renal function from her baseline chronic kidney disease, however she then went into congestive heart failure. Therefore she had to be diuresis. Throughout this hospital stay, she has been largely asymptomatic from her burning chest pain or dyspnea almost since the first day after admission. Her chronic kidney disease is probably unlikely to improve any significantly. Intravenous Lasix has somewhat worsened  but has achieved some significant weight loss. Since hospitalization, she is now just 8 pounds lighter than when she came into the hospital. Her creatinine today is slightly worse than yesterday but I believe this is acceptable and she can be sent home with oral Lasix. She needs close followup with Dr. Dietrich Pates within a week or so to decide regarding cardiac catheterization. I've told her that should she get burning chest discomfort again she must come to the emergency room immediately.            Procedures:  None.   Consultations:  Cardiology, Dr. Dietrich Pates.  Discharge Exam: Filed Vitals:   10/30/12 1125 10/30/12 1321 10/30/12 2139 10/31/12 0440  BP: 188/64 128/50 136/66 172/82  Pulse: 70 57 55 62  Temp:  97.8 F (36.6 C) 97.7 F (36.5 C) 97.9 F (36.6 C)  TempSrc:  Oral Oral Oral  Resp:  18 18 18   Height:      Weight:    70.3 kg (154 lb 15.7 oz)  SpO2:  98% 96% 96%    General: She looks systemically  well. There is no increased work of breathing. Cardiovascular: Heart sounds are present without gallop rhythm. Jugular venous pressure not raised. There is very minimal peripheral pitting edema in her ankles. Respiratory: Lung fields are entirely clear.  Discharge Instructions  Discharge Orders    Future Orders Please Complete By  Expires   Diet - low sodium heart healthy      Diet - low sodium heart healthy      Increase activity slowly      Increase activity slowly          Medication List     As of 10/31/2012  8:56 AM    STOP taking these medications         lisinopril 20 MG tablet   Commonly known as: PRINIVIL,ZESTRIL      NIFEdipine 60 MG 24 hr tablet   Commonly known as: PROCARDIA XL/ADALAT-CC      TAKE these medications         amLODipine 10 MG tablet   Commonly known as: NORVASC   Take 1 tablet (10 mg total) by mouth daily.      atorvastatin 40 MG tablet   Commonly known as: LIPITOR   Take 1 tablet (40 mg total) by mouth daily at 6 PM.      cloNIDine 0.2 mg/24hr patch   Commonly known as: CATAPRES - Dosed in mg/24 hr   Place 1 patch (0.2 mg total) onto the skin once a week.      furosemide 40 MG tablet   Commonly known as: LASIX   Take 1 tablet (40 mg total) by mouth daily.      insulin NPH 100 UNIT/ML injection   Commonly known as: HUMULIN N,NOVOLIN N   Inject 20-25 Units into the skin 2 (two) times daily. Patient takes 25 units in the morning and 20 units at night      iron polysaccharides 150 MG capsule   Commonly known as: NIFEREX   Take 1 capsule (150 mg total) by mouth 2 (two) times daily.      isosorbide mononitrate 120 MG 24 hr tablet   Commonly known as: IMDUR   Take 1 tablet (120 mg total) by mouth daily.      labetalol 200 MG tablet   Commonly known as: NORMODYNE   Take 2 tablets (400 mg total) by mouth 2 (two) times daily.      LONGS ADULT LOW STRENGTH ASA 81 MG EC tablet   Generic drug: aspirin   Take 81 mg by mouth daily.      nitroGLYCERIN 0.4 MG SL tablet   Commonly known as: NITROSTAT   Place 1 tablet (0.4 mg total) under the tongue every 5 (five) minutes x 3 doses as needed for chest pain.      omega-3 acid ethyl esters 1 G capsule   Commonly known as: LOVAZA   Take 1 capsule (1 g total) by mouth 2 (two) times daily.      SYSTANE OP   Apply 1 drop to  eye daily.           Follow-up Information    Follow up with [place] Bing, MD. Schedule an appointment as soon as possible for a visit in 1 week.   Contact information:   [address] [place] [address] [phone]           The results of significant diagnostics from this hospitalization (including imaging, microbiology, ancillary and laboratory) are listed below for reference.  Significant Diagnostic Studies: Dg Chest 2 View  10/26/2012  *RADIOLOGY REPORT*  Clinical Data: Chest pain.  Possible congestive heart failure.  CHEST - 2 VIEW  Comparison: 10/21/2012 and 09/19/2011.  Findings: The heart size and mediastinal contours are stable. There is aortic atherosclerosis.  There is increased interstitial prominence with septal lines consistent with pulmonary edema. There are small bilateral pleural effusions.  No confluent airspace opacity is seen.  IMPRESSION: Interstitial pulmonary edema with small bilateral pleural effusions consistent with congestive heart failure.   Original Report Authenticated By: Carey Bullocks, M.D.    Nm Myocar Single W/spect W/wall Motion And Ef  10/23/2012  nm myoview pharmacologic stress  Ordering Physician: [place] Bing  Reading Physician: [place] Bing  Clinical Data: 78 year old woman with known coronary artery disease admitted to hospital with chest discomfort.  NUCLEAR MEDICINE ADENOSINE STRESS MYOVIEW STUDY WITH SPECT AND LEFT VENTRIUCLAR EJECTION FRACTION  Radionuclide Data: One-day rest/stress protocol performed with 10/30 mCi of Tc-66m Myoview.  Stress Data: Regadenoson infusion resulted in no symptoms.  There was a modest increase in heart rate and minimal decrease in systolic blood pressure following drug administration.  No arrhythmias noted.  EKG: Normal sinus rhythm; first-degree AV block; LVH with repolarization abnormality; septal myocardial infarction of indeterminate age. No significant change   with pharmacologic stress.   Scintigraphic Data: Acquisition notable for mild movement, mild breast attenuation and mild diaphragmatic attenuation.  Left ventricular size was at the upper limit of normal.  On tomographic images reconstructed in standard planes, there was a small to moderate defect involving the inferior wall and extending inferolaterally and inferoseptally.  Tracer uptake was moderately depressed.   Moderate but incomplete reversibility was appreciated.  A small defect of mild to moderate severity was present in the distal anteroseptal segment extending apically.  Near complete reversibility was apparent by comparison to the resting portion of the study.  The gated reconstruction demonstrated normal regional and global LV systolic function as well as normal systolic accentuation of activity throughout.  Estimated ejection fraction was 65%.  IMPRESSION: Abnormal pharmacologic stress nuclear study revealing borderline left ventricular dilatation, normal LV systolic function, a negative stress  EKG, but scintigraphic evidence for inferior and distal anteroseptal ischemia suggesting significant obstructive disease in the distribution of two coronary vessels.  Other findings as noted.   Original Report Authenticated By:  Bing    Dg Chest Port 1 View  10/30/2012  *RADIOLOGY REPORT*  Clinical Data: Follow-up CHF.  PORTABLE CHEST - 1 VIEW  Comparison: 10/29/2012  Findings: Mild cardiomegaly.  Bibasilar airspace opacities have increased slightly since prior study, which represent worsening atelectasis and/or edema.  Small bilateral effusions.  No acute bony abnormality.  IMPRESSION: Increasing bibasilar airspace opacities with small effusions.  This could represent worsening edema and/or atelectasis.   Original Report Authenticated By: Charlett Nose, M.D.    Dg Chest Port 1 View  10/29/2012  *RADIOLOGY REPORT*  Clinical Data: Short of breath.  CHF.  PORTABLE CHEST - 1 VIEW  Comparison: 10/26/2012.  Findings: Mild  cardiomegaly.  Basilar predominant right greater than left airspace disease is present, most compatible with asymmetric/atypical pulmonary edema.  Basilar atelectasis is also present.  Tortuous thoracic aorta. Monitoring leads are projected over the chest.  Small bilateral pleural effusions are likely present.  IMPRESSION: Mild moderate CHF with right greater than left basilar airspace disease, most compatible with asymmetric pulmonary edema; pneumonia considered less likely.   Original Report Authenticated By: Andreas Newport, M.D.    Dg  Chest Portable 1 View  10/21/2012  *RADIOLOGY REPORT*  Clinical Data: Chest pain.  PORTABLE CHEST - 1 VIEW  Comparison: Chest x-ray 09/19/2011.  Findings: Lung volumes are low.  No consolidative airspace disease. No pleural effusions.  No evidence of pulmonary edema.  Heart size is normal. The patient is rotated to the right on today's exam, resulting in distortion of the mediastinal contours and reduced diagnostic sensitivity and specificity for mediastinal pathology. Atherosclerosis of the thoracic aorta.  IMPRESSION: 1.  Low lung volumes without radiographic evidence of acute cardiopulmonary disease. 2.  Atherosclerosis.   Original Report Authenticated By: Trudie Reed, M.D.         Labs: Basic Metabolic Panel:  Lab 10/31/12 8469 10/30/12 0504 10/29/12 0518 10/28/12 1035 10/28/12 0559 10/27/12 0500  NA 138 140 140 -- 136 138  K 4.4 4.2 4.2 -- 4.2 4.3  CL 105 108 109 -- 107 108  CO2 25 23 20  -- 23 22  GLUCOSE 144* 87 67* -- 98 82  BUN 47* 42* 42* -- 40* 38*  CREATININE 2.58* 2.48* 2.27* -- 2.29* 2.10*  CALCIUM 10.1 9.6 9.5 -- 9.3 9.6  MG -- -- -- 1.8 -- --  PHOS 4.6 -- -- -- -- --   Liver Function Tests:  Lab 10/31/12 0525 10/25/12 0504  AST 11 9  ALT 12 9  ALKPHOS 75 48  BILITOT 0.2* 0.1*  PROT 6.2 5.5*  ALBUMIN 2.9* 2.8*     CBC:  Lab 10/31/12 0525 10/30/12 1052 10/29/12 0657 10/29/12 0518 10/25/12 0504  WBC 3.0* -- -- 3.3* 3.1*   NEUTROABS -- -- -- -- --  HGB 8.3* 8.7* 8.0* 7.8* 8.9*  HCT 24.6* 26.0* 23.7* 23.4* 26.8*  MCV 90.1 -- -- 90.3 91.2  PLT 224 -- -- 157 144*   Cardiac Enzymes:  Lab 10/27/12 0500  CKTOTAL --  CKMB --  CKMBINDEX --  TROPONINI <0.30   BNP: BNP (last 3 results)  Basename 10/29/12 0518 10/26/12 0835 10/21/12 2111  PROBNP 4674.0* 3196.0* 1968.0*   CBG:  Lab 10/31/12 0733 10/30/12 2137 10/30/12 1616 10/30/12 1132 10/30/12 0728  GLUCAP 141* 116* 156* 136* 106*       Signed:  Nayomi Tabron C  [facility] 10/31/2012, 8:56 AM

## 2012-10-31 NOTE — Progress Notes (Signed)
Subjective:  Patient ready for discharge  Objective:  Vital Signs in the last 24 hours: Temp:  [97.7 F (36.5 C)-97.9 F (36.6 C)] 97.9 F (36.6 C) (12/04 0440) Pulse Rate:  [55-70] 62  (12/04 0440) Resp:  [18] 18  (12/04 0440) BP: (128-188)/(50-82) 172/82 mmHg (12/04 0440) SpO2:  [96 %-98 %] 96 % (12/04 0440) Weight:  [154 lb 15.7 oz (70.3 kg)] 154 lb 15.7 oz (70.3 kg) (12/04 0440)  Intake/Output from previous day: 12/03 0701 - 12/04 0700 In: 729 [P.O.:726; I.V.:3] Out: 2800 [Urine:2800] Intake/Output from this shift:    Physical Exam: NECK: Without JVD, HJR, or bruit LUNGS: Decreased breath sounds, but clear HEART: Regular rate and rhythm, 2/6 systolic murmur and 1/6 diastolic murmur, no gallop, rub, bruit, thrill, or heave EXTREMITIES: Without cyanosis, clubbing, or edema   Lab Results:  Basename 10/31/12 0525 10/30/12 1052 10/29/12 0518  WBC 3.0* -- 3.3*  HGB 8.3* 8.7* --  PLT 224 -- 157    Basename 10/31/12 0525 10/30/12 0504  NA 138 140  K 4.4 4.2  CL 105 108  CO2 25 23  GLUCOSE 144* 87  BUN 47* 42*  CREATININE 2.58* 2.48*   No results found for this basename: TROPONINI:2,CK,MB:2 in the last 72 hours Hepatic Function Panel  Basename 10/31/12 0525  PROT 6.2  ALBUMIN 2.9*  AST 11  ALT 12  ALKPHOS 75  BILITOT 0.2*  BILIDIR --  IBILI --   No results found for this basename: CHOL in the last 72 hours No results found for this basename: PROTIME in the last 72 hours  Imaging: Dg Chest Port 1 View  10/30/2012  *RADIOLOGY REPORT*  Clinical Data: Follow-up CHF.  PORTABLE CHEST - 1 VIEW  Comparison: 10/29/2012  Findings: Mild cardiomegaly.  Bibasilar airspace opacities have increased slightly since prior study, which represent worsening atelectasis and/or edema.  Small bilateral effusions.  No acute bony abnormality.  IMPRESSION: Increasing bibasilar airspace opacities with small effusions.  This could represent worsening edema and/or atelectasis.   Original  Report Authenticated By: Charlett Nose, M.D.    Dg Chest Port 1 View  10/29/2012  *RADIOLOGY REPORT*  Clinical Data: Short of breath.  CHF.  PORTABLE CHEST - 1 VIEW  Comparison: 10/26/2012.  Findings: Mild cardiomegaly.  Basilar predominant right greater than left airspace disease is present, most compatible with asymmetric/atypical pulmonary edema.  Basilar atelectasis is also present.  Tortuous thoracic aorta. Monitoring leads are projected over the chest.  Small bilateral pleural effusions are likely present.  IMPRESSION: Mild moderate CHF with right greater than left basilar airspace disease, most compatible with asymmetric pulmonary edema; pneumonia considered less likely.   Original Report Authenticated By: Andreas Newport, M.D.     Cardiac Studies:  Assessment/Plan:  Assessment/Plan: Arteriosclerotic cardiovascular disease (ASCVD): Non-ST segment elevation myocardial infarction; moderate to high risk stress test; attempting to control congestive heart failure and acute on chronic renal disease. Patient requesting discharge and doesn't want to proceed with cath.Will schedule outpatient follow up.  Hypertension: Blood pressure relatively well controlled except for a single measurement of 190 systolic; all diastolic pressures well less than 80.  Diastolic congestive heart failure: Clinically improved with moderate diuresis, but chest x-ray has worsened; hopefully, this is simply a lagging clinical indicator.  Most recent proBNP level of 4674 is well above typical value of 1500-2500.  Chronic kidney disease: Stage III-IV,  Crt 2.58, exacerbated by diuresis, but weight continues to be about baseline, and pulmonary edema persists. Switch to oral  lasix and going home.  Arrhythmia: Available tracings shows only artifact, not ventricular tachycardia.    LOS: 10 days    [name] 10/31/2012, 8:38 AM  Patient examined chart reviewed.  Agree with assessment as above by [name]  Patient  will f/u with Dr Antoine Poche in [place]

## 2012-11-07 ENCOUNTER — Encounter: Payer: Medicare Other | Admitting: Adult Health

## 2012-12-05 ENCOUNTER — Encounter: Payer: Self-pay | Admitting: Cardiology

## 2012-12-05 ENCOUNTER — Ambulatory Visit (INDEPENDENT_AMBULATORY_CARE_PROVIDER_SITE_OTHER): Payer: Medicare Other | Admitting: Cardiology

## 2012-12-05 VITALS — BP 138/60 | HR 60 | Ht 64.0 in | Wt 161.0 lb

## 2012-12-05 DIAGNOSIS — I10 Essential (primary) hypertension: Secondary | ICD-10-CM

## 2012-12-05 DIAGNOSIS — I2510 Atherosclerotic heart disease of native coronary artery without angina pectoris: Secondary | ICD-10-CM

## 2012-12-05 DIAGNOSIS — I5031 Acute diastolic (congestive) heart failure: Secondary | ICD-10-CM

## 2012-12-05 DIAGNOSIS — I7090 Unspecified atherosclerosis: Secondary | ICD-10-CM

## 2012-12-05 DIAGNOSIS — I679 Cerebrovascular disease, unspecified: Secondary | ICD-10-CM

## 2012-12-05 DIAGNOSIS — I5032 Chronic diastolic (congestive) heart failure: Secondary | ICD-10-CM

## 2012-12-05 DIAGNOSIS — I200 Unstable angina: Secondary | ICD-10-CM

## 2012-12-05 DIAGNOSIS — I472 Ventricular tachycardia: Secondary | ICD-10-CM

## 2012-12-05 DIAGNOSIS — I509 Heart failure, unspecified: Secondary | ICD-10-CM

## 2012-12-05 NOTE — Patient Instructions (Addendum)
The current medical regimen is effective;  continue present plan and medications.  Please have blood work at Dr [employer] office today  (BMP)  Follow up in 4 months with Dr Antoine Poche.  You will receive a letter in the mail 2 months before you are due.  Please call us when you receive this letter to schedule your follow up appointment.

## 2012-12-05 NOTE — Progress Notes (Signed)
HPI The patient presents for follow up of coronary artery disease and HTN.  Since I last saw her she was hospitalized at [facility]. She had chest discomfort suggestive of unstable angina. She had a stress perfusion study demonstrating inferior and distal anteroseptal ischemia. She was to have a cardiac cath. However, she had progressive renal insufficiency. She subsequently had resolution of her symptoms and so cath was deferred. She says  Since that hospitalization she has had no further chest pain. She denies neck or arm discomfort. She's had no palpitations, presyncope or syncope.  She did have a headache that she thought was related to Catapres patch which she has since stopped using. Of note we did stop lisinopril and nifedipine  During her hospitalization.  She did report that she's had some clumsiness in her feet and actually fell today while on her son's gravel driveway. She hit her head slightly.   Allergies  Allergen Reactions  . Propoxyphene-Acetaminophen   . Simvastatin Other (See Comments)    headache    Current Outpatient Prescriptions  Medication Sig Dispense Refill  . amLODipine (NORVASC) 10 MG tablet Take 1 tablet (10 mg total) by mouth daily.  30 tablet  0  . aspirin (LONGS ADULT LOW STRENGTH ASA) 81 MG EC tablet Take 81 mg by mouth daily.        Marland Kitchen atorvastatin (LIPITOR) 40 MG tablet Take 1 tablet (40 mg total) by mouth daily at 6 PM.  30 tablet  0  . cloNIDine (CATAPRES - DOSED IN MG/24 HR) 0.2 mg/24hr patch Place 1 patch (0.2 mg total) onto the skin once a week.  4 patch  0  . furosemide (LASIX) 40 MG tablet Take 1 tablet (40 mg total) by mouth daily.  30 tablet  0  . insulin NPH (HUMULIN N,NOVOLIN N) 100 UNIT/ML injection Inject 20-25 Units into the skin 2 (two) times daily. Patient takes 25 units in the morning and 20 units at night      . iron polysaccharides (NIFEREX) 150 MG capsule Take 1 capsule (150 mg total) by mouth 2 (two) times daily.  60 capsule  0  .  isosorbide mononitrate (IMDUR) 120 MG 24 hr tablet Take 1 tablet (120 mg total) by mouth daily.  30 tablet  0  . labetalol (NORMODYNE) 200 MG tablet Take 2 tablets (400 mg total) by mouth 2 (two) times daily.  120 tablet  0  . nitroGLYCERIN (NITROSTAT) 0.4 MG SL tablet Place 1 tablet (0.4 mg total) under the tongue every 5 (five) minutes x 3 doses as needed for chest pain.  30 tablet  0  . omega-3 acid ethyl esters (LOVAZA) 1 G capsule Take 1 capsule (1 g total) by mouth 2 (two) times daily.  60 capsule  0  . Polyethyl Glycol-Propyl Glycol (SYSTANE OP) Apply 1 drop to eye daily.          Past Medical History  Diagnosis Date  . CAD (coronary artery disease) 2003    Last catheterization 2008. Two-vessel PCI of the first OM branch and mid LAD.  Marland Kitchen CVA (cerebral vascular accident) 1996    LEFT SIDE   . IDDM (insulin dependent diabetes mellitus)   . CKD (chronic kidney disease)   . Myocardial infarction   . Congestive heart failure   . Hypercholesteremia     Past Surgical History  Procedure Date  . Appendectomy   . Knee surgery     ROS:  As stated in the HPI and  negative for all other systems.  PHYSICAL EXAM BP 138/60  Pulse 60  Ht 5\' 4"  (1.626 m)  Wt 161 lb (73.029 kg)  BMI 27.64 kg/m2  SpO2 99% GENERAL:  Well appearing HEENT:  Pupils equal round and reactive, fundi not visualized, oral mucosa unremarkable, dentures, she has a slight knot on her head without any bruising erythema or bleeding  NECK:  No jugular venous distention, waveform within normal limits, carotid upstroke brisk and symmetric, no bruits, positive bilateral transmitted systolic murmur, no thyromegaly LYMPHATICS:  No cervical, inguinal adenopathy LUNGS:  Clear to auscultation bilaterally BACK:  No CVA tenderness CHEST:  Unremarkable HEART:  PMI not displaced or sustained,S1 and S2 within normal limits, no S3, no S4, no clicks, no rubs, apical systolic murmurs, increases with the strain phase of valsava and  radiating out the carotids. ABD:  Flat, positive bowel sounds normal in frequency in pitch, no bruits, no rebound, no guarding, no midline pulsatile mass, no hepatomegaly, no splenomegaly EXT:  2 plus pulses throughout, no cyanosis no , mild left greater than right ankle swelling.  Varicose veins SKIN:  No rashes no nodules NEURO:  Cranial nerves II through XII grossly intact, motor grossly intact throughout PSYCH:  Cognitively intact, oriented to person place and time  ASSESSMENT AND PLAN  CAD, NATIVE VESSEL -  Given the fact that the patient's not having any further chest pain and she has renal insufficiency making cath higher risk we will continue to manage her medically. She will remain on the meds as listed.  HYPERTENSION, BENIGN -  Her blood pressure seems to be at target. I think she's doing okay off the clonidine patch. She will continue these meds as listed.  DIASTOLIC HEART FAILURE, CHRONIC -  She seems to be euvolemic. She will continue the meds as listed.  CKD (chronic kidney disease) -  She has had chronic renal insufficiency. I will send her to get a basic mtabolic profile. I  CARDIAC MURMUR, SYSTOLIC -  She has some calcification of her aortic valve on her echo when she was hospitalized in November. I reviewed this. She's had no change in her exam or symptoms. No further evaluation is necessary.

## 2012-12-06 ENCOUNTER — Other Ambulatory Visit: Payer: Self-pay | Admitting: Cardiology

## 2013-02-07 ENCOUNTER — Encounter: Payer: Self-pay | Admitting: Family Medicine

## 2013-02-18 ENCOUNTER — Ambulatory Visit: Payer: Self-pay | Admitting: Family Medicine

## 2013-02-23 ENCOUNTER — Other Ambulatory Visit: Payer: Self-pay | Admitting: *Deleted

## 2013-02-25 MED ORDER — MECLIZINE HCL 25 MG PO TABS: 25.0000 mg | ORAL_TABLET | Freq: Three times a day (TID) | ORAL | Status: DC | PRN

## 2013-02-26 ENCOUNTER — Other Ambulatory Visit: Payer: Self-pay

## 2013-02-26 MED ORDER — ATORVASTATIN CALCIUM 40 MG PO TABS: ORAL_TABLET | ORAL | Status: DC

## 2013-02-26 NOTE — Telephone Encounter (Signed)
Pt rqst 90 day supply atorvastatin 40mg 

## 2013-03-01 ENCOUNTER — Ambulatory Visit (INDEPENDENT_AMBULATORY_CARE_PROVIDER_SITE_OTHER): Payer: Medicare Other

## 2013-03-01 ENCOUNTER — Encounter: Payer: Self-pay | Admitting: Family Medicine

## 2013-03-01 ENCOUNTER — Ambulatory Visit (INDEPENDENT_AMBULATORY_CARE_PROVIDER_SITE_OTHER): Payer: Medicare Other | Admitting: Family Medicine

## 2013-03-01 VITALS — BP 142/62 | HR 65 | Temp 97.4°F | Ht 64.0 in | Wt 165.8 lb

## 2013-03-01 DIAGNOSIS — I83893 Varicose veins of bilateral lower extremities with other complications: Secondary | ICD-10-CM | POA: Insufficient documentation

## 2013-03-01 DIAGNOSIS — N183 Chronic kidney disease, stage 3 unspecified: Secondary | ICD-10-CM

## 2013-03-01 DIAGNOSIS — I5032 Chronic diastolic (congestive) heart failure: Secondary | ICD-10-CM

## 2013-03-01 DIAGNOSIS — M25473 Effusion, unspecified ankle: Secondary | ICD-10-CM

## 2013-03-01 DIAGNOSIS — E785 Hyperlipidemia, unspecified: Secondary | ICD-10-CM

## 2013-03-01 DIAGNOSIS — M25476 Effusion, unspecified foot: Secondary | ICD-10-CM | POA: Insufficient documentation

## 2013-03-01 DIAGNOSIS — D649 Anemia, unspecified: Secondary | ICD-10-CM

## 2013-03-01 DIAGNOSIS — I10 Essential (primary) hypertension: Secondary | ICD-10-CM

## 2013-03-01 DIAGNOSIS — I7090 Unspecified atherosclerosis: Secondary | ICD-10-CM

## 2013-03-01 DIAGNOSIS — R011 Cardiac murmur, unspecified: Secondary | ICD-10-CM

## 2013-03-01 DIAGNOSIS — I679 Cerebrovascular disease, unspecified: Secondary | ICD-10-CM

## 2013-03-01 DIAGNOSIS — M25475 Effusion, left foot: Secondary | ICD-10-CM

## 2013-03-01 DIAGNOSIS — I2510 Atherosclerotic heart disease of native coronary artery without angina pectoris: Secondary | ICD-10-CM

## 2013-03-01 DIAGNOSIS — E119 Type 2 diabetes mellitus without complications: Secondary | ICD-10-CM

## 2013-03-01 DIAGNOSIS — M1990 Unspecified osteoarthritis, unspecified site: Secondary | ICD-10-CM | POA: Insufficient documentation

## 2013-03-01 LAB — BASIC METABOLIC PANEL WITH GFR
BUN: 38 mg/dL — ABNORMAL HIGH (ref 6–23)
CO2: 27 mEq/L (ref 19–32)
Calcium: 10.3 mg/dL (ref 8.4–10.5)
Chloride: 105 mEq/L (ref 96–112)
Creat: 2.33 mg/dL — ABNORMAL HIGH (ref 0.50–1.10)
GFR, Est African American: 22 mL/min — ABNORMAL LOW
GFR, Est Non African American: 19 mL/min — ABNORMAL LOW
Glucose, Bld: 147 mg/dL — ABNORMAL HIGH (ref 70–99)
Potassium: 4.8 mEq/L (ref 3.5–5.3)
Sodium: 140 mEq/L (ref 135–145)

## 2013-03-01 LAB — HEPATIC FUNCTION PANEL
ALT: 14 U/L (ref 0–35)
AST: 12 U/L (ref 0–37)
Albumin: 4.4 g/dL (ref 3.5–5.2)
Alkaline Phosphatase: 54 U/L (ref 39–117)
Bilirubin, Direct: 0.1 mg/dL (ref 0.0–0.3)
Indirect Bilirubin: 0.4 mg/dL (ref 0.0–0.9)
Total Bilirubin: 0.5 mg/dL (ref 0.3–1.2)
Total Protein: 6.8 g/dL (ref 6.0–8.3)

## 2013-03-01 LAB — POCT GLYCOSYLATED HEMOGLOBIN (HGB A1C): Hemoglobin A1C: 6.7

## 2013-03-01 LAB — POCT UA - MICROALBUMIN: Microalbumin Ur, POC: 100 mg/dL

## 2013-03-01 MED ORDER — ACETAMINOPHEN-CODEINE #3 300-30 MG PO TABS: 1.0000 | ORAL_TABLET | Freq: Four times a day (QID) | ORAL | Status: DC | PRN

## 2013-03-01 NOTE — Assessment & Plan Note (Signed)
Stable -no changes  

## 2013-03-01 NOTE — Assessment & Plan Note (Signed)
Chronic pain. Tylenol not helping anymore. Needs something stronger

## 2013-03-01 NOTE — Assessment & Plan Note (Signed)
Has been stable and asymptomatic

## 2013-03-01 NOTE — Progress Notes (Signed)
Patient ID: [name], female   DOB: [birth year], 79 y.o.   MRN: [id number] SUBJECTIVE:   HPI:  Patient is here for follow up of Diabetes Mellitus.Symptoms of DM:has had no Nocturia ,deniesUrinary Frequency ,denies Blurred vision ,deniesDizziness,denies.Dysuria,deniesparesthesias, admits toextremity pain or ulcers. Marland Kitchendenieschest pain. .has not hadan annual eye exam. do check the feet. doescheck CBGs. Average CBG:________.Marland Kitchen deniesto episodes of hypoglycemia. doeshave an emergency hypoglycemic plan. admits toCompliance with medications. deniesProblems with medications.  Patient is here for follow up of hypertension: deniesHeadache;deniesChest Pain;deniesweakness;deniesShortness of Breath or Orthopnea;deniesVisual changes;deniespalpitations;deniescough;deniespedal edema;deniessymptoms of TIA or stroke; admits toCompliance with medications. deniesProblems with medications.  Swelling of the left foot.needs this checked.    Past Medical History  Diagnosis Date  . CAD (coronary artery disease) 2003    Last catheterization 2008. Two-vessel PCI of the first OM branch and mid LAD.  Marland Kitchen CVA (cerebral vascular accident) 1996    LEFT SIDE   . IDDM (insulin dependent diabetes mellitus)   . CKD (chronic kidney disease)   . Myocardial infarction   . Congestive heart failure   . Hypercholesteremia    Past Surgical History  Procedure Laterality Date  . Appendectomy    . Knee surgery     History   Social History  . Marital Status: Widowed    Spouse Name: N/A    Number of Children: N/A  . Years of Education: N/A   Occupational History  . Not on file.   Social History Main Topics  . Smoking status: Never Smoker   . Smokeless tobacco: Not on file  . Alcohol Use: No  . Drug Use: No  . Sexually Active: No   Other Topics Concern  . Not on file   Social History Narrative   Lives alone, but has a 39 year old grand daughter staying with her right now.   Family History  Problem  Relation Age of Onset  . Diabetes Brother     RETINOPATHY   . Drug abuse Brother   . Liver cancer Brother   . Coronary artery disease Mother   . Breast cancer Daughter   . Heart attack Mother   . Heart attack Father   . Coronary artery disease Father   . Diabetes Sister    Current Outpatient Prescriptions on File Prior to Visit  Medication Sig Dispense Refill  . amLODipine (NORVASC) 10 MG tablet TAKE 1 TABLET DAILY  30 tablet  5  . aspirin (LONGS ADULT LOW STRENGTH ASA) 81 MG EC tablet Take 81 mg by mouth daily.        Marland Kitchen atorvastatin (LIPITOR) 40 MG tablet TAKE ONE TABLET AT BEDTIME  90 tablet  1  . furosemide (LASIX) 40 MG tablet TAKE 1 TABLET DAILY  30 tablet  5  . insulin NPH (HUMULIN N,NOVOLIN N) 100 UNIT/ML injection Inject 20-25 Units into the skin 2 (two) times daily. Patient takes 25 units in the morning and 20 units at night      . iron polysaccharides (NIFEREX) 150 MG capsule Take 1 capsule (150 mg total) by mouth 2 (two) times daily.  60 capsule  0  . isosorbide mononitrate (IMDUR) 120 MG 24 hr tablet TAKE 1 TABLET DAILY  30 tablet  5  . labetalol (NORMODYNE) 200 MG tablet TAKE (2) TABLETS TWICE DAILY.  120 tablet  5  . meclizine (ANTIVERT) 25 MG tablet Take 1 tablet (25 mg total) by mouth 3 (three) times daily as needed.  30 tablet  0  . nitroGLYCERIN (  NITROSTAT) 0.4 MG SL tablet Place 1 tablet (0.4 mg total) under the tongue every 5 (five) minutes x 3 doses as needed for chest pain.  30 tablet  0  . Polyethyl Glycol-Propyl Glycol (SYSTANE OP) Apply 1 drop to eye daily.        . cloNIDine (CATAPRES - DOSED IN MG/24 HR) 0.2 mg/24hr patch Place 1 patch onto the skin once a week.      Marland Kitchen NIFEdipine (PROCARDIA XL/ADALAT-CC) 60 MG 24 hr tablet Take 60 mg by mouth daily.      Marland Kitchen omega-3 acid ethyl esters (LOVAZA) 1 G capsule Take 1 capsule (1 g total) by mouth 2 (two) times daily.  60 capsule  0   No current facility-administered medications on file prior to visit.   Allergies   Allergen Reactions  . Propoxyphene-Acetaminophen   . Simvastatin Other (See Comments)    headache   Immunization History  Administered Date(s) Administered  . Influenza-Generic 08/28/2012  . Pneumococcal-Generic 08/28/2010   Prior to Admission medications   Medication Sig Start Date End Date Taking? Authorizing Provider  amLODipine (NORVASC) 10 MG tablet TAKE 1 TABLET DAILY 12/06/12  Yes Rollene Rotunda, MD  aspirin (LONGS ADULT LOW STRENGTH ASA) 81 MG EC tablet Take 81 mg by mouth daily.     Yes Historical Provider, MD  atorvastatin (LIPITOR) 40 MG tablet TAKE ONE TABLET AT BEDTIME 02/26/13  Yes Rollene Rotunda, MD  furosemide (LASIX) 40 MG tablet TAKE 1 TABLET DAILY 12/06/12  Yes Rollene Rotunda, MD  insulin NPH (HUMULIN N,NOVOLIN N) 100 UNIT/ML injection Inject 20-25 Units into the skin 2 (two) times daily. Patient takes 25 units in the morning and 20 units at night   Yes Historical Provider, MD  iron polysaccharides (NIFEREX) 150 MG capsule Take 1 capsule (150 mg total) by mouth 2 (two) times daily. 10/31/12  Yes Nimish Normajean Glasgow, MD  isosorbide mononitrate (IMDUR) 120 MG 24 hr tablet TAKE 1 TABLET DAILY 12/06/12  Yes Rollene Rotunda, MD  labetalol (NORMODYNE) 200 MG tablet TAKE (2) TABLETS TWICE DAILY. 12/06/12  Yes Rollene Rotunda, MD  meclizine (ANTIVERT) 25 MG tablet Take 1 tablet (25 mg total) by mouth 3 (three) times daily as needed. 02/23/13  Yes Raymon Mutton, NP  nitroGLYCERIN (NITROSTAT) 0.4 MG SL tablet Place 1 tablet (0.4 mg total) under the tongue every 5 (five) minutes x 3 doses as needed for chest pain. 10/31/12  Yes Nimish Normajean Glasgow, MD  Omega-3 Fatty Acids (SUPER OMEGA-3) 1000 MG CAPS Take 1 capsule by mouth 2 (two) times daily.   Yes Historical Provider, MD  Polyethyl Glycol-Propyl Glycol (SYSTANE OP) Apply 1 drop to eye daily.     Yes Historical Provider, MD  cloNIDine (CATAPRES - DOSED IN MG/24 HR) 0.2 mg/24hr patch Place 1 patch onto the skin once a week.    Historical Provider, MD   NIFEdipine (PROCARDIA XL/ADALAT-CC) 60 MG 24 hr tablet Take 60 mg by mouth daily.    Historical Provider, MD  omega-3 acid ethyl esters (LOVAZA) 1 G capsule Take 1 capsule (1 g total) by mouth 2 (two) times daily. 10/31/12   Nimish Normajean Glasgow, MD    ROS:  OBJECTIVE:    APPEARANCE:  Patient in no acute distress.The patient appeared well nourished and normally developed. Acyanotic.  Waist:  VITAL SIGNS:  SKIN: warm and  Dry without overt rashes, tattoos and scars  HEAD and Neck: without JVD, Normal No scleral icterus  CHEST & LUNGS: Clear  CVS:  Reveals the PMI to be normally located. Regular rhythm, First and Second Heart sounds are normal, and absence of murmurs, rubs or gallops.  ABDOMEN:  Benign,, no organomegaly, no masses, no Abdominal Aortic enlargement. No Guarding , no rebound. No Bruits.  RECTAL:N/A  GU:N/A  EXTREMETIES: edematous 2+ on right , trace on left. Pedal pulses are abnormal. The posterial tibial pulses are diminished. The dorsal pedis are present.  MUSCULOSKELETAL:  Spine: decreased flexion due to stiffness. Joints: left foot. Has a 1.5 cm swelling (hard boney) of the left 5th Tarso-metatarsal joint.t Right leg significant varicose veins.  NEUROLOGIC: oriented to time,place and person; nonfocal. Strength is normal Sensory is abormal of the feet. The right big toe tip was unable to sense the monofilament exam.  Reflexes are normal Cranial Nerves are normal.  ASSESSMENT:  Chronic anemia Has been stable and asymptomatic  Swelling of foot joint Will do an Xray of the left foot. Suspect that it is all due to arthritic changes.  Hyperlipidemia Await labs. Low fat diet. Hand outs given  Arteriosclerotic cardiovascular disease (ASCVD) No changes, as per cardiologists. Request was for a BMP while she was here today  DIASTOLIC HEART FAILURE, CHRONIC Stable no changes  CARDIAC MURMUR, SYSTOLIC No changes.  Cerebrovascular disease Aggressive  dietary intervention discussed.  DM (diabetes mellitus) DM monitoring encouraged. Await labs.  Hypertension Discussed HTN goals. Stable. meds reviewed.  Osteoarthritis Chronic pain. Tylenol not helping anymore. Needs something stronger    PLAN:  [facility] reading (PRIMARY) by  Dr. Leodis Sias: Preliminary: soft tissue  Swelling. Arthritic changes. Meds ordered this encounter  Medications  . Omega-3 Fatty Acids (SUPER OMEGA-3) 1000 MG CAPS    Sig: Take 1 capsule by mouth 2 (two) times daily.  Marland Kitchen DISCONTD: acetaminophen-codeine (TYLENOL #3) 300-30 MG per tablet    Sig: Take 1 tablet by mouth every 4 (four) hours as needed for pain.  Marland Kitchen acetaminophen-codeine (TYLENOL #3) 300-30 MG per tablet    Sig: Take 1 tablet by mouth every 6 (six) hours as needed for pain.    Dispense:  45 tablet    Refill:  0   Orders Placed This Encounter  Procedures  . DG Foot Complete Left    Standing Status: Future     Number of Occurrences: 1     Standing Expiration Date: 05/01/2014    Order Specific Question:  Reason for Exam (SYMPTOM  OR DIAGNOSIS REQUIRED)    Answer:  foot swelling    Order Specific Question:  Preferred imaging location?    Answer:  Internal  . NMR Lipoprofile with Lipids  . Hepatic function panel  . BASIC METABOLIC PANEL WITH GFR  . Microalbumin, urine  . POCT glycosylated hemoglobin (Hb A1C)  . POCT UA - Microalbumin  Diet and Exercise discussed with patient. For nutrition information, I recommend books: Eat to Live by Dr Monico Hoar. Prevent and Reverse Heart Disease by Dr Suzzette Righter.  Exercise recommendations are:  If unable to walk, then the patient can exercise in a chair 3 times a day. By flapping arms like a bird gently and raising legs outwards to the front.  If ambulatory, the patient can go for walks for 30 minutes 3 times a week. Then increase the intensity and duration as tolerated. Goal is to try to attain exercise frequency to 5 times a week. Best to  perform resistance exercises 2 days a week and cardio type exercises 3 days per week.  Rtc 3-4 months.  Karon Heckendorn P.  Modesto Charon, M.D.

## 2013-03-01 NOTE — Assessment & Plan Note (Signed)
Await labs. Low fat diet. Hand outs given

## 2013-03-01 NOTE — Assessment & Plan Note (Signed)
No changes, as per cardiologists. Request was for a BMP while she was here today

## 2013-03-01 NOTE — Assessment & Plan Note (Signed)
Will do an Xray of the left foot. Suspect that it is all due to arthritic changes.

## 2013-03-01 NOTE — Assessment & Plan Note (Signed)
Discussed HTN goals. Stable. meds reviewed.

## 2013-03-01 NOTE — Assessment & Plan Note (Signed)
Aggressive dietary intervention discussed.

## 2013-03-01 NOTE — Assessment & Plan Note (Signed)
DM monitoring encouraged. Await labs.

## 2013-03-01 NOTE — Assessment & Plan Note (Signed)
No changes

## 2013-03-02 ENCOUNTER — Other Ambulatory Visit: Payer: Self-pay | Admitting: Physician Assistant

## 2013-03-02 LAB — MICROALBUMIN, URINE: Microalb, Ur: 105.86 mg/dL — ABNORMAL HIGH (ref 0.00–1.89)

## 2013-03-04 LAB — NMR LIPOPROFILE WITH LIPIDS
Cholesterol, Total: 205 mg/dL — ABNORMAL HIGH (ref <200)
HDL Particle Number: 28.7 umol/L — ABNORMAL LOW (ref >=30.5)
HDL Size: 9.4 nm (ref >=9.2)
HDL-C: 40 mg/dL (ref >=40)
LDL (calc): 108 mg/dL — ABNORMAL HIGH (ref <100)
LDL Particle Number: 795 nmol/L (ref <1000)
LDL Size: 19.6 nm — ABNORMAL LOW (ref >20.5)
LP-IR Score: 35 (ref <=45)
Large HDL-P: 4.8 umol/L (ref >=4.8)
Large VLDL-P: 0.9 nmol/L (ref <=2.7)
Small LDL Particle Number: 514 nmol/L (ref <=527)
Triglycerides: 283 mg/dL — ABNORMAL HIGH (ref <150)
VLDL Size: 47.7 nm — ABNORMAL HIGH (ref <=46.6)

## 2013-03-06 ENCOUNTER — Telehealth: Payer: Self-pay | Admitting: *Deleted

## 2013-03-06 ENCOUNTER — Other Ambulatory Visit: Payer: Self-pay | Admitting: Family Medicine

## 2013-03-06 MED ORDER — FENOFIBRATE 54 MG PO TABS: 54.0000 mg | ORAL_TABLET | Freq: Every day | ORAL | Status: DC

## 2013-03-06 NOTE — Telephone Encounter (Signed)
Message copied by Magdalene River on Wed Mar 06, 2013 10:35 AM ------      Message from: Ileana Ladd      Created: Wed Mar 06, 2013  9:53 AM       Labs abnormal. Lipids well controlled. Diabetes, close to goal. Will need to adjust her lipid medications.      Prescription adjustment will be made in Epic. ------

## 2013-03-06 NOTE — Progress Notes (Signed)
Quick Note:  Labs abnormal. Lipids well controlled. Diabetes, close to goal. Will need to adjust her lipid medications. Prescription adjustment will be made in Epic. ______

## 2013-03-09 ENCOUNTER — Other Ambulatory Visit: Payer: Self-pay | Admitting: Physician Assistant

## 2013-03-09 ENCOUNTER — Other Ambulatory Visit: Payer: Self-pay | Admitting: Cardiology

## 2013-03-12 NOTE — Telephone Encounter (Signed)
Patient returned call. Please call back

## 2013-03-19 ENCOUNTER — Telehealth: Payer: Self-pay

## 2013-03-19 NOTE — Telephone Encounter (Signed)
Tried calling pt to discuss her medications due to telephone message from J Cohill LPN about being confused on the meds.  I notified [employer] and got recent med list. No answer at home number.

## 2013-03-19 NOTE — Telephone Encounter (Signed)
Spoke with pt she didn't know about the tricor and advised her this was prescribed on 03-06-13. Advised her to call [facility] pharmacy and they would help expplain meds she is on. She stated I don't take tricor-- advised again -it was at the [facility] rx. Pt agreed to go to [employer].  Spoke to [facility] pharmacy earlier and they reported she is on tricor.  Advise pt to call back prn

## 2013-03-19 NOTE — Telephone Encounter (Signed)
Pt very confused about her meds- will forward this this to gina h. To address with dr Modesto Charon.

## 2013-03-20 ENCOUNTER — Encounter: Payer: Self-pay | Admitting: *Deleted

## 2013-03-20 ENCOUNTER — Other Ambulatory Visit: Payer: Self-pay | Admitting: Family Medicine

## 2013-03-20 DIAGNOSIS — I509 Heart failure, unspecified: Secondary | ICD-10-CM

## 2013-03-20 DIAGNOSIS — Z79899 Other long term (current) drug therapy: Secondary | ICD-10-CM

## 2013-03-20 NOTE — Progress Notes (Signed)
Patient ID: [name], female   DOB: [birth year], 79 y.o.   MRN: [id number] PT'S DAUGHTER CALLED BACK AFTER CALLING 911, EMS CAME OUT AND EVALUATED THE PT AND THEY THINK SHE IS STABLE. PT REFUSED TO GO TO HOSP. DAUGHTER HAS AN APPT FOR Friday WITH DR Modesto Charon TO REVIEW ALL PT'S INFO/MEDS.

## 2013-03-20 NOTE — Progress Notes (Signed)
Patient ID: [name], female   DOB: [birth year], 79 y.o.   MRN: [id number] Pt's daughter called in to triage stating that her mom felt very sick and nausea right now. Her mom srtates that she took one pill from every bottle last she has in her possession. Her daughter read them to me which includes:  Isosorbide 140 Amlodipine 10 Lasix 40 Fenofibrate 54 Lisinopril 40 Meclizine 25 Labetalol 200 Tylenol #3 And another  Tylenol #3 Pt has bp cuff at home- i advised her to ck bp - it was 125/52 p-58 @ 9:28 am S/s-  Nausea, sleepy, dont feel well  I spoke with Dr Modesto Charon- he recommends er visit now- call 911- and daughter aware that she needs to go.  Also he is going to set up [facility] advanced home care or thn to help in the house He wants to meet with the daughter on Friday or Monday to discuss all meds and follow up on the status of her care.  Daughter will call triage today and update on pt's progress.   jhb

## 2013-03-21 ENCOUNTER — Other Ambulatory Visit: Payer: Self-pay | Admitting: *Deleted

## 2013-03-21 MED ORDER — ATORVASTATIN CALCIUM 40 MG PO TABS: ORAL_TABLET | ORAL | Status: DC

## 2013-03-22 ENCOUNTER — Encounter: Payer: Self-pay | Admitting: Family Medicine

## 2013-03-22 ENCOUNTER — Ambulatory Visit (INDEPENDENT_AMBULATORY_CARE_PROVIDER_SITE_OTHER): Payer: Medicare Other | Admitting: Family Medicine

## 2013-03-22 VITALS — BP 118/57 | HR 59 | Temp 98.4°F | Ht 63.0 in | Wt 165.2 lb

## 2013-03-22 DIAGNOSIS — N183 Chronic kidney disease, stage 3 unspecified: Secondary | ICD-10-CM

## 2013-03-22 DIAGNOSIS — Z79899 Other long term (current) drug therapy: Secondary | ICD-10-CM

## 2013-03-22 DIAGNOSIS — M1990 Unspecified osteoarthritis, unspecified site: Secondary | ICD-10-CM

## 2013-03-22 DIAGNOSIS — I509 Heart failure, unspecified: Secondary | ICD-10-CM

## 2013-03-22 DIAGNOSIS — E119 Type 2 diabetes mellitus without complications: Secondary | ICD-10-CM

## 2013-03-22 DIAGNOSIS — I10 Essential (primary) hypertension: Secondary | ICD-10-CM

## 2013-03-22 DIAGNOSIS — I5031 Acute diastolic (congestive) heart failure: Secondary | ICD-10-CM

## 2013-03-22 DIAGNOSIS — E785 Hyperlipidemia, unspecified: Secondary | ICD-10-CM

## 2013-03-22 NOTE — Progress Notes (Signed)
Patient ID: [name], female   DOB: 02-07-1932, 79 y.o.   MRN: [id number] SUBJECTIVE: HPI: Brought by daughter who moved in with patient to take care of her.  Patient has so many meds to take that she just takes whatever she wants whenever she wants. She apparently took excessive amounts of tylenol with codiene 2 days ago when her meds were mixed up.She was stuporous and refused to go to the hospital with EMS. She is not depressed. She is uncooperative with her family and tries to Ross independence.  PMH/PSH: reviewed/updated in Epic  SH/FH: reviewed/updated in Epic  Allergies: reviewed/updated in Epic  Medications: reviewed/updated in Epic  Immunizations: reviewed/updated in Epic  ROS: As above in the HPI. All other systems are stable or negative.  OBJECTIVE: APPEARANCE: OBese white female  Patient in no acute distress.The patient appeared well nourished and normally developed. Acyanotic. Waist:obese. VITAL SIGNS:BP 118/57  Pulse 59  Temp(Src) 98.4 F (36.9 C) (Oral)  Ht 5\' 3"  (1.6 m)  Wt 165 lb 3.2 oz (74.934 kg)  BMI 29.27 kg/m2   SKIN: warm and  Dry without overt rashes, tattoos and scars  HEAD and Neck: without JVD, Head and scalp: normal Eyes:No scleral icterus. Fundi normal, eye movements normal. Ears: Auricle normal, canal normal, Tympanic membranes normal, insufflation normal. Nose: normal Throat: normal Neck & thyroid: normal  CHEST & LUNGS: Chest wall: normal Lungs: Clear  CVS: Reveals the PMI to be normally located. Regular rhythm, First and Second Heart sounds are normal,  absence of murmurs, rubs or gallops. Peripheral vasculature: Radial pulses: normal Dorsal pedis pulses: normal Posterior pulses: normal  ABDOMEN:  Appearance: normal Benign,, no organomegaly, no masses, no Abdominal Aortic enlargement. No Guarding , no rebound. No Bruits. Bowel sounds: normal  RECTAL: N/A GU: N/A  EXTREMETIES: nonedematous. Both Femoral  and Pedal pulses are normal.   NEUROLOGIC: oriented to,place and person; nonfocal in extremities.   ASSESSMENT Medication management - Plan: COMPLETE METABOLIC PANEL WITH GFR  Osteoarthritis  CKD (chronic kidney disease) stage 3, GFR 30-59 ml/min  DM (diabetes mellitus)  Hyperlipidemia  Hypertension  OA (osteoarthritis)  Acute diastolic congestive heart failure   PLAN: Orders Placed This Encounter  Procedures  . COMPLETE METABOLIC PANEL WITH GFR   No results found for this or any previous visit (from the past 24 hour(s)). Meds ordered this encounter  Medications  . lisinopril (PRINIVIL,ZESTRIL) 40 MG tablet    Sig: Take 40 mg by mouth daily. Given by Dr Kristian Covey  . labetalol (NORMODYNE) 200 MG tablet    Sig:   advised daughter to go to [facility] and pay the fee for the medication boxes for the pharmacy to sort the meds for each day and times. This is a service the pharmacy offers.  Keep the appointment for 2 to 3 months as planned.  Massa Pe P. Modesto Charon, M.D.

## 2013-03-23 ENCOUNTER — Other Ambulatory Visit: Payer: Self-pay | Admitting: Family Medicine

## 2013-03-23 DIAGNOSIS — N184 Chronic kidney disease, stage 4 (severe): Secondary | ICD-10-CM

## 2013-03-23 LAB — COMPLETE METABOLIC PANEL WITH GFR
ALT: 11 U/L (ref 0–35)
AST: 10 U/L (ref 0–37)
Albumin: 4 g/dL (ref 3.5–5.2)
Alkaline Phosphatase: 41 U/L (ref 39–117)
BUN: 44 mg/dL — ABNORMAL HIGH (ref 6–23)
CO2: 24 mEq/L (ref 19–32)
Calcium: 9.7 mg/dL (ref 8.4–10.5)
Chloride: 106 mEq/L (ref 96–112)
Creat: 3.08 mg/dL — ABNORMAL HIGH (ref 0.50–1.10)
GFR, Est African American: 16 mL/min — ABNORMAL LOW
GFR, Est Non African American: 14 mL/min — ABNORMAL LOW
Glucose, Bld: 158 mg/dL — ABNORMAL HIGH (ref 70–99)
Potassium: 5.1 mEq/L (ref 3.5–5.3)
Sodium: 141 mEq/L (ref 135–145)
Total Bilirubin: 0.3 mg/dL (ref 0.3–1.2)
Total Protein: 6 g/dL (ref 6.0–8.3)

## 2013-03-23 NOTE — Progress Notes (Signed)
Quick Note:  Labs abnormal.kidney functions are worse. Liver is okay. Will need to see a kidney specialist. Let daughter know. Referral already made in EPIC. Thanks  ______

## 2013-04-23 ENCOUNTER — Other Ambulatory Visit: Payer: Self-pay | Admitting: General Practice

## 2013-05-14 ENCOUNTER — Emergency Department (HOSPITAL_COMMUNITY): Payer: Medicare Other

## 2013-05-14 ENCOUNTER — Encounter (HOSPITAL_COMMUNITY): Payer: Self-pay

## 2013-05-14 ENCOUNTER — Inpatient Hospital Stay (HOSPITAL_COMMUNITY)
Admission: EM | Admit: 2013-05-14 | Discharge: 2013-05-18 | DRG: 348 | Disposition: A | Discharge status: Home Health | Payer: Medicare Other | Attending: Internal Medicine | Admitting: Internal Medicine

## 2013-05-14 DIAGNOSIS — I129 Hypertensive chronic kidney disease with stage 1 through stage 4 chronic kidney disease, or unspecified chronic kidney disease: Secondary | ICD-10-CM | POA: Diagnosis present

## 2013-05-14 DIAGNOSIS — I83893 Varicose veins of bilateral lower extremities with other complications: Secondary | ICD-10-CM

## 2013-05-14 DIAGNOSIS — Y92009 Unspecified place in unspecified non-institutional (private) residence as the place of occurrence of the external cause: Secondary | ICD-10-CM

## 2013-05-14 DIAGNOSIS — E663 Overweight: Secondary | ICD-10-CM | POA: Diagnosis present

## 2013-05-14 DIAGNOSIS — N179 Acute kidney failure, unspecified: Secondary | ICD-10-CM | POA: Diagnosis present

## 2013-05-14 DIAGNOSIS — W19XXXA Unspecified fall, initial encounter: Secondary | ICD-10-CM

## 2013-05-14 DIAGNOSIS — R011 Cardiac murmur, unspecified: Secondary | ICD-10-CM

## 2013-05-14 DIAGNOSIS — E119 Type 2 diabetes mellitus without complications: Secondary | ICD-10-CM | POA: Diagnosis present

## 2013-05-14 DIAGNOSIS — I509 Heart failure, unspecified: Secondary | ICD-10-CM | POA: Diagnosis present

## 2013-05-14 DIAGNOSIS — E785 Hyperlipidemia, unspecified: Secondary | ICD-10-CM

## 2013-05-14 DIAGNOSIS — I2510 Atherosclerotic heart disease of native coronary artery without angina pectoris: Secondary | ICD-10-CM

## 2013-05-14 DIAGNOSIS — E669 Obesity, unspecified: Secondary | ICD-10-CM | POA: Diagnosis present

## 2013-05-14 DIAGNOSIS — I200 Unstable angina: Secondary | ICD-10-CM

## 2013-05-14 DIAGNOSIS — Z79899 Other long term (current) drug therapy: Secondary | ICD-10-CM

## 2013-05-14 DIAGNOSIS — M47817 Spondylosis without myelopathy or radiculopathy, lumbosacral region: Secondary | ICD-10-CM | POA: Diagnosis present

## 2013-05-14 DIAGNOSIS — I472 Ventricular tachycardia: Secondary | ICD-10-CM

## 2013-05-14 DIAGNOSIS — R079 Chest pain, unspecified: Secondary | ICD-10-CM

## 2013-05-14 DIAGNOSIS — I5032 Chronic diastolic (congestive) heart failure: Secondary | ICD-10-CM | POA: Diagnosis present

## 2013-05-14 DIAGNOSIS — I10 Essential (primary) hypertension: Secondary | ICD-10-CM | POA: Diagnosis present

## 2013-05-14 DIAGNOSIS — D72819 Decreased white blood cell count, unspecified: Secondary | ICD-10-CM | POA: Diagnosis present

## 2013-05-14 DIAGNOSIS — M6282 Rhabdomyolysis: Secondary | ICD-10-CM | POA: Diagnosis present

## 2013-05-14 DIAGNOSIS — Z8249 Family history of ischemic heart disease and other diseases of the circulatory system: Secondary | ICD-10-CM

## 2013-05-14 DIAGNOSIS — D649 Anemia, unspecified: Secondary | ICD-10-CM | POA: Diagnosis present

## 2013-05-14 DIAGNOSIS — M1990 Unspecified osteoarthritis, unspecified site: Secondary | ICD-10-CM | POA: Diagnosis present

## 2013-05-14 DIAGNOSIS — N184 Chronic kidney disease, stage 4 (severe): Secondary | ICD-10-CM | POA: Diagnosis present

## 2013-05-14 DIAGNOSIS — M47816 Spondylosis without myelopathy or radiculopathy, lumbar region: Secondary | ICD-10-CM

## 2013-05-14 DIAGNOSIS — E7800 Pure hypercholesterolemia, unspecified: Secondary | ICD-10-CM | POA: Diagnosis present

## 2013-05-14 DIAGNOSIS — N289 Disorder of kidney and ureter, unspecified: Secondary | ICD-10-CM | POA: Diagnosis present

## 2013-05-14 DIAGNOSIS — I5031 Acute diastolic (congestive) heart failure: Secondary | ICD-10-CM

## 2013-05-14 DIAGNOSIS — Z9181 History of falling: Secondary | ICD-10-CM

## 2013-05-14 DIAGNOSIS — E871 Hypo-osmolality and hyponatremia: Secondary | ICD-10-CM | POA: Diagnosis present

## 2013-05-14 DIAGNOSIS — I252 Old myocardial infarction: Secondary | ICD-10-CM

## 2013-05-14 DIAGNOSIS — F0390 Unspecified dementia without behavioral disturbance: Secondary | ICD-10-CM | POA: Diagnosis present

## 2013-05-14 DIAGNOSIS — B964 Proteus (mirabilis) (morganii) as the cause of diseases classified elsewhere: Secondary | ICD-10-CM | POA: Diagnosis present

## 2013-05-14 DIAGNOSIS — I679 Cerebrovascular disease, unspecified: Secondary | ICD-10-CM

## 2013-05-14 DIAGNOSIS — Z6827 Body mass index (BMI) 27.0-27.9, adult: Secondary | ICD-10-CM

## 2013-05-14 DIAGNOSIS — T675XXA Heat exhaustion, unspecified, initial encounter: Secondary | ICD-10-CM

## 2013-05-14 DIAGNOSIS — K610 Anal abscess: Secondary | ICD-10-CM

## 2013-05-14 DIAGNOSIS — Z794 Long term (current) use of insulin: Secondary | ICD-10-CM

## 2013-05-14 DIAGNOSIS — Z833 Family history of diabetes mellitus: Secondary | ICD-10-CM

## 2013-05-14 DIAGNOSIS — Z8673 Personal history of transient ischemic attack (TIA), and cerebral infarction without residual deficits: Secondary | ICD-10-CM

## 2013-05-14 DIAGNOSIS — R509 Fever, unspecified: Secondary | ICD-10-CM

## 2013-05-14 DIAGNOSIS — K612 Anorectal abscess: Principal | ICD-10-CM | POA: Diagnosis present

## 2013-05-14 DIAGNOSIS — E162 Hypoglycemia, unspecified: Secondary | ICD-10-CM

## 2013-05-14 HISTORY — DX: Spondylosis without myelopathy or radiculopathy, lumbar region: M47.816

## 2013-05-14 HISTORY — DX: Unspecified dementia, unspecified severity, without behavioral disturbance, psychotic disturbance, mood disturbance, and anxiety: F03.90

## 2013-05-14 HISTORY — DX: Essential (primary) hypertension: I10

## 2013-05-14 LAB — CBC WITH DIFFERENTIAL/PLATELET
Hemoglobin: 9.8 g/dL — ABNORMAL LOW (ref 12.0–15.0)
Lymphocytes Relative: 5 % — ABNORMAL LOW (ref 12–46)
Lymphs Abs: 0.4 10*3/uL — ABNORMAL LOW (ref 0.7–4.0)
MCH: 29.8 pg (ref 26.0–34.0)
MCV: 89.4 fL (ref 78.0–100.0)
Monocytes Relative: 16 % — ABNORMAL HIGH (ref 3–12)
Neutrophils Relative %: 79 % — ABNORMAL HIGH (ref 43–77)
Platelets: 159 10*3/uL (ref 150–400)
RBC: 3.29 MIL/uL — ABNORMAL LOW (ref 3.87–5.11)
WBC: 8.9 10*3/uL (ref 4.0–10.5)

## 2013-05-14 LAB — LACTIC ACID, PLASMA: Lactic Acid, Venous: 0.7 mmol/L (ref 0.5–2.2)

## 2013-05-14 LAB — URINALYSIS, ROUTINE W REFLEX MICROSCOPIC
Bilirubin Urine: NEGATIVE
Specific Gravity, Urine: 1.02 (ref 1.005–1.030)
Urobilinogen, UA: 0.2 mg/dL (ref 0.0–1.0)
pH: 6 (ref 5.0–8.0)

## 2013-05-14 LAB — COMPREHENSIVE METABOLIC PANEL
AST: 14 U/L (ref 0–37)
BUN: 46 mg/dL — ABNORMAL HIGH (ref 6–23)
CO2: 24 mEq/L (ref 19–32)
Glucose, Bld: 164 mg/dL — ABNORMAL HIGH (ref 70–99)
Potassium: 4 mEq/L (ref 3.5–5.1)
Sodium: 133 mEq/L — ABNORMAL LOW (ref 135–145)

## 2013-05-14 LAB — TROPONIN I: Troponin I: <0.3 ng/mL (ref <0.30)

## 2013-05-14 LAB — URINE MICROSCOPIC-ADD ON

## 2013-05-14 LAB — PRO B NATRIURETIC PEPTIDE: Pro B Natriuretic peptide (BNP): 4841 pg/mL — ABNORMAL HIGH (ref 0–450)

## 2013-05-14 LAB — PROTIME-INR: INR: 1.2 (ref 0.00–1.49)

## 2013-05-14 MED ORDER — AMLODIPINE BESYLATE 5 MG PO TABS
10.0000 mg | ORAL_TABLET | Freq: Every day | ORAL | Status: DC
  Administered 2013-05-15 – 2013-05-18 (×4): 10 mg via ORAL
  Filled 2013-05-14 (×4): qty 2

## 2013-05-14 MED ORDER — ATORVASTATIN CALCIUM 40 MG PO TABS
40.0000 mg | ORAL_TABLET | Freq: Every day | ORAL | Status: DC
  Administered 2013-05-14 – 2013-05-17 (×4): 40 mg via ORAL
  Filled 2013-05-14 (×4): qty 1

## 2013-05-14 MED ORDER — FENOFIBRATE 54 MG PO TABS
54.0000 mg | ORAL_TABLET | Freq: Every day | ORAL | Status: DC
  Administered 2013-05-15 – 2013-05-18 (×4): 54 mg via ORAL
  Filled 2013-05-14 (×6): qty 1

## 2013-05-14 MED ORDER — OMEGA-3-ACID ETHYL ESTERS 1 G PO CAPS
1.0000 g | ORAL_CAPSULE | Freq: Two times a day (BID) | ORAL | Status: DC
  Administered 2013-05-14 – 2013-05-18 (×8): 1 g via ORAL
  Filled 2013-05-14 (×8): qty 1

## 2013-05-14 MED ORDER — INSULIN ASPART 100 UNIT/ML ~~LOC~~ SOLN
0.0000 [IU] | Freq: Three times a day (TID) | SUBCUTANEOUS | Status: DC
  Administered 2013-05-15 – 2013-05-16 (×5): 2 [IU] via SUBCUTANEOUS
  Administered 2013-05-16: 1 [IU] via SUBCUTANEOUS
  Administered 2013-05-17 (×3): 3 [IU] via SUBCUTANEOUS
  Administered 2013-05-18: 2 [IU] via SUBCUTANEOUS

## 2013-05-14 MED ORDER — SODIUM CHLORIDE 0.9 % IV BOLUS (SEPSIS)
500.0000 mL | Freq: Once | INTRAVENOUS | Status: AC
  Administered 2013-05-14: 500 mL via INTRAVENOUS

## 2013-05-14 MED ORDER — HALOPERIDOL LACTATE 5 MG/ML IJ SOLN: 2.5000 mg | Freq: Four times a day (QID) | INTRAMUSCULAR | Status: DC | PRN

## 2013-05-14 MED ORDER — SODIUM CHLORIDE 0.9 % IV SOLN: INTRAVENOUS | Status: DC

## 2013-05-14 MED ORDER — SUPER OMEGA-3 1000 MG PO CAPS: 1.0000 | ORAL_CAPSULE | Freq: Two times a day (BID) | ORAL | Status: DC

## 2013-05-14 MED ORDER — SODIUM CHLORIDE 0.9 % IV SOLN
INTRAVENOUS | Status: DC
  Administered 2013-05-14 – 2013-05-17 (×4): via INTRAVENOUS

## 2013-05-14 MED ORDER — HEPARIN SODIUM (PORCINE) 5000 UNIT/ML IJ SOLN
5000.0000 [IU] | Freq: Three times a day (TID) | INTRAMUSCULAR | Status: DC
  Filled 2013-05-14: qty 1

## 2013-05-14 MED ORDER — MECLIZINE HCL 12.5 MG PO TABS: 25.0000 mg | ORAL_TABLET | Freq: Three times a day (TID) | ORAL | Status: DC | PRN

## 2013-05-14 MED ORDER — CLONIDINE HCL 0.2 MG/24HR TD PTWK
0.2000 mg | MEDICATED_PATCH | TRANSDERMAL | Status: DC
  Administered 2013-05-14: 0.2 mg via TRANSDERMAL
  Filled 2013-05-14: qty 1

## 2013-05-14 MED ORDER — CLINDAMYCIN PHOSPHATE 600 MG/50ML IV SOLN
600.0000 mg | Freq: Three times a day (TID) | INTRAVENOUS | Status: DC
  Administered 2013-05-14 – 2013-05-15 (×2): 600 mg via INTRAVENOUS
  Filled 2013-05-14 (×3): qty 50

## 2013-05-14 MED ORDER — CLINDAMYCIN PHOSPHATE 600 MG/50ML IV SOLN
INTRAVENOUS | Status: AC
  Filled 2013-05-14: qty 100

## 2013-05-14 MED ORDER — NIFEDIPINE ER OSMOTIC RELEASE 30 MG PO TB24: 60.0000 mg | ORAL_TABLET | Freq: Every day | ORAL | Status: DC

## 2013-05-14 MED ORDER — LABETALOL HCL 200 MG PO TABS
200.0000 mg | ORAL_TABLET | Freq: Two times a day (BID) | ORAL | Status: DC
  Administered 2013-05-14 – 2013-05-16 (×4): 200 mg via ORAL
  Filled 2013-05-14 (×4): qty 1

## 2013-05-14 MED ORDER — NITROGLYCERIN 0.4 MG SL SUBL: 0.4000 mg | SUBLINGUAL_TABLET | SUBLINGUAL | Status: DC | PRN

## 2013-05-14 MED ORDER — ACETAMINOPHEN 325 MG PO TABS
650.0000 mg | ORAL_TABLET | ORAL | Status: DC | PRN
  Administered 2013-05-14 – 2013-05-15 (×2): 650 mg via ORAL
  Filled 2013-05-14 (×2): qty 2

## 2013-05-14 MED ORDER — HYDRALAZINE HCL 20 MG/ML IJ SOLN: 10.0000 mg | INTRAMUSCULAR | Status: DC | PRN

## 2013-05-14 MED ORDER — ONDANSETRON HCL 4 MG/2ML IJ SOLN: 4.0000 mg | INTRAMUSCULAR | Status: DC | PRN

## 2013-05-14 MED ORDER — ISOSORBIDE MONONITRATE ER 60 MG PO TB24
120.0000 mg | ORAL_TABLET | Freq: Every day | ORAL | Status: DC
  Administered 2013-05-15 – 2013-05-18 (×4): 120 mg via ORAL
  Filled 2013-05-14 (×4): qty 2

## 2013-05-14 MED ORDER — SODIUM CHLORIDE 0.9 % IV SOLN
INTRAVENOUS | Status: DC
  Administered 2013-05-14: 20:00:00 via INTRAVENOUS

## 2013-05-14 MED ORDER — DOCUSATE SODIUM 100 MG PO CAPS
100.0000 mg | ORAL_CAPSULE | Freq: Two times a day (BID) | ORAL | Status: DC
  Administered 2013-05-14 – 2013-05-15 (×2): 100 mg via ORAL
  Filled 2013-05-14 (×6): qty 1

## 2013-05-14 MED ORDER — INSULIN ASPART 100 UNIT/ML ~~LOC~~ SOLN
0.0000 [IU] | Freq: Every day | SUBCUTANEOUS | Status: DC
  Administered 2013-05-15: 2 [IU] via SUBCUTANEOUS
  Administered 2013-05-17 (×2): 3 [IU] via SUBCUTANEOUS

## 2013-05-14 MED ORDER — SORBITOL 70 % SOLN: 30.0000 mL | Freq: Every day | Status: DC | PRN

## 2013-05-14 MED ORDER — RISAQUAD PO CAPS
2.0000 | ORAL_CAPSULE | Freq: Every day | ORAL | Status: DC
  Administered 2013-05-14 – 2013-05-18 (×5): 2 via ORAL
  Filled 2013-05-14 (×5): qty 2

## 2013-05-14 NOTE — ED Notes (Signed)
pt fully immobilized upon arrival to ED. LSB removed with EDP at bedside. Pt still has c-collar present. C-collar repositioned several times. pt educated on importance of not moving neck.

## 2013-05-14 NOTE — ED Notes (Signed)
Pt in radiology 

## 2013-05-14 NOTE — H&P (Signed)
[facility] History and Physical  BONNIE OVERDORF  ZOX:[id number]  DOB: 31-Mar-1932   DOA: 05/14/2013   PCP:   Rudi Heap, MD   Chief Complaint:  Found on the floor at home   HPI: [name] is a 79 y.o. female.   Elderly lady with dementia, lives at home with her son who is away most of the day; patient reports a fall early today and was found to home, on the floor conscious; ambient room temperature hot but unable to get up off the floor unaided; says she's been on the ground since morning. Temperature was 103;  the patient was incontinence of urine while lying/sitting on the floor, and there is some question as to whether this incontinence is been going on for days.   Evaluated with x-rays and plain CT scans in the emergency room; no fractures found; the does have what appears to be perianal versus perirectal abscess, which was suspected to be the cause of her fever and acute weakness.  Hospitalist service was called and surgical services also reports been informed  Patient reports that she has been falling repeatedly for the past 3 days; the patient appears somewhat confused   Rewiew of Systems:  Denies fever chills nausea or vomiting but because of her mental state review of systems is incomplete    Past Medical History  Diagnosis Date  . CAD (coronary artery disease) 2003    Last catheterization 2008. Two-vessel PCI of the first OM branch and mid LAD.  Marland Kitchen CVA (cerebral vascular accident) 1996    LEFT SIDE   . IDDM (insulin dependent diabetes mellitus)   . CKD (chronic kidney disease)   . Myocardial infarction   . Congestive heart failure   . Hypercholesteremia   . Hypertension   . Dementia     Past Surgical History  Procedure Laterality Date  . Appendectomy    . Knee surgery      Medications:  HOME MEDS: Prior to Admission medications   Medication Sig Start Date End Date Taking? Authorizing Provider  amLODipine (NORVASC) 10 MG tablet Take 10 mg by  mouth daily.   Yes Historical Provider, MD  aspirin EC 81 MG tablet Take 81 mg by mouth daily.   Yes Historical Provider, MD  furosemide (LASIX) 40 MG tablet Take 40 mg by mouth daily.   Yes Historical Provider, MD  insulin NPH-regular (NOVOLIN 70/30) (70-30) 100 UNIT/ML injection Inject 25 Units into the skin 2 (two) times daily.   Yes Historical Provider, MD  isosorbide mononitrate (IMDUR) 120 MG 24 hr tablet Take 120 mg by mouth daily.   Yes Historical Provider, MD  meclizine (ANTIVERT) 25 MG tablet Take 25 mg by mouth 3 (three) times daily as needed.   Yes Historical Provider, MD  acetaminophen-codeine (TYLENOL #3) 300-30 MG per tablet Take 1 tablet by mouth every 6 (six) hours as needed for pain. 03/01/13   Ileana Ladd, MD  atorvastatin (LIPITOR) 40 MG tablet Take 40 mg by mouth at bedtime. 03/21/13   Coralie Keens, FNP  cloNIDine (CATAPRES - DOSED IN MG/24 HR) 0.2 mg/24hr patch Place 1 patch onto the skin once a week.    Historical Provider, MD  fenofibrate 54 MG tablet Take 1 tablet (54 mg total) by mouth daily. 03/06/13   Ileana Ladd, MD  insulin NPH (HUMULIN N,NOVOLIN N) 100 UNIT/ML injection Inject 20-25 Units into the skin 2 (two) times daily. Patient takes 25 units in the morning and  20 units at night    Historical Provider, MD  iron polysaccharides (NIFEREX) 150 MG capsule Take 1 capsule (150 mg total) by mouth 2 (two) times daily. 10/31/12   Nimish Normajean Glasgow, MD  labetalol (NORMODYNE) 200 MG tablet Take 200 mg by mouth 2 (two) times daily.  12/06/12   Rollene Rotunda, MD  lisinopril (PRINIVIL,ZESTRIL) 40 MG tablet Take 40 mg by mouth daily. Given by Dr Kristian Covey    Historical Provider, MD  NIFEdipine (PROCARDIA XL/ADALAT-CC) 60 MG 24 hr tablet Take 60 mg by mouth daily.    Historical Provider, MD  nitroGLYCERIN (NITROSTAT) 0.4 MG SL tablet Place 1 tablet (0.4 mg total) under the tongue every 5 (five) minutes x 3 doses as needed for chest pain. 10/31/12   Nimish Normajean Glasgow, MD  omega-3 acid  ethyl esters (LOVAZA) 1 G capsule Take 1 capsule (1 g total) by mouth 2 (two) times daily. 10/31/12   Nimish Normajean Glasgow, MD  Omega-3 Fatty Acids (SUPER OMEGA-3) 1000 MG CAPS Take 1 capsule by mouth 2 (two) times daily.    Historical Provider, MD  Polyethyl Glycol-Propyl Glycol (SYSTANE OP) Apply 1 drop to eye daily.      Historical Provider, MD     Allergies:  Allergies  Allergen Reactions  . Propoxyphene-Acetaminophen   . Simvastatin Other (See Comments)    headache    Social History:   reports that she has never smoked. She does not have any smokeless tobacco history on file. She reports that she does not drink alcohol or use illicit drugs.  Family History: Family History  Problem Relation Age of Onset  . Diabetes Brother     RETINOPATHY   . Drug abuse Brother   . Liver cancer Brother   . Coronary artery disease Mother   . Breast cancer Daughter   . Heart attack Mother   . Heart attack Father   . Coronary artery disease Father   . Diabetes Sister      Physical Exam: Filed Vitals:   05/14/13 1803 05/14/13 2038  BP: 176/53 168/52  Pulse: 90 88  Temp: 102.1 F (38.9 C) 101.6 F (38.7 C)  TempSrc: Oral Oral  Resp: 20 20  Height: 5\' 4"  (1.626 m)   Weight: 73.483 kg (162 lb)   SpO2: 97% 98%   Blood pressure 168/52, pulse 88, temperature 101.6 F (38.7 C), temperature source Oral, resp. rate 20, height 5\' 4"  (1.626 m), weight 73.483 kg (162 lb), SpO2 98.00%. Body mass index is 27.79 kg/(m^2).   GEN:  Pleasant but confused elderly lady lying bed in no acute distress; cooperative with exam PSYCH:  Alert HEENT: Mucous membranes pink, dry, and anicteric; PERRLA; EOM intact; no cervical lymphadenopathy nor thyromegaly or carotid bruit; no JVD; Breasts:: Not examined CHEST WALL: No tenderness CHEST: Normal respiration, clear to auscultation bilaterally HEART: Regular rate and rhythm; no murmurs rubs or gallops ABDOMEN: Obese, soft non-tender; no masses, no organomegaly,  normal abdominal bowel sounds;  Rectal Exam: Not done EXTREMITIES:  age-appropriate arthropathy of the hands and knees; no edema; no ulcerations. Genitalia: Swelling and erythema in the right perianal area extending anteriorly towards the labia PULSES: 2+ and symmetric SKIN: Normal hydration no rash or ulceration CNS: Cranial nerves 2-12 grossly intact no focal lateralizing neurologic deficit   Labs on Admission:  Basic Metabolic Panel:  Recent Labs Lab 05/14/13 1832  NA 133*  K 4.0  CL 96  CO2 24  GLUCOSE 164*  BUN 46*  CREATININE 3.11*  CALCIUM 10.1   Liver Function Tests:  Recent Labs Lab 05/14/13 1832  AST 14  ALT 11  ALKPHOS 38*  BILITOT 0.7  PROT 7.6  ALBUMIN 4.1   No results found for this basename: LIPASE, AMYLASE,  in the last 168 hours No results found for this basename: AMMONIA,  in the last 168 hours CBC:  Recent Labs Lab 05/14/13 1832  WBC 8.9  NEUTROABS 7.0  HGB 9.8*  HCT 29.4*  MCV 89.4  PLT 159   Cardiac Enzymes:  Recent Labs Lab 05/14/13 1832  TROPONINI <0.30   BNP: No components found with this basename: POCBNP,  D-dimer: No components found with this basename: D-DIMER,  CBG: No results found for this basename: GLUCAP,  in the last 168 hours  Radiological Exams on Admission: Ct Abdomen Pelvis Wo Contrast  05/14/2013   *RADIOLOGY REPORT*  Clinical Data: Periumbilical abscess  CT ABDOMEN AND PELVIS WITHOUT CONTRAST  Technique:  Multidetector CT imaging of the abdomen and pelvis was performed following the standard protocol without intravenous contrast.  Comparison: March 21, 2008.  Findings: Severe degenerative disc disease is noted at T12-L1. Severe degenerative disc disease is noted at L3-4 with grade 1 anterolisthesis secondary to posterior facet joint hypertrophy. Visualized lung bases appear normal.  No focal abnormality seen involving the liver, spleen or pancreas. No gallstones are noted. Adrenal glands appear normal.  No renal  or ureteral calculi are noted.  Left-sided peripelvic cysts are again noted.  No definite hydronephrosis or renal obstruction is noted.  Status post appendectomy.  Urinary bladder appears normal.  Probable exophytic fibroid is seen arising from uterine fundus which is unchanged compared to prior exam.  Atherosclerotic calcifications of abdominal aorta are noted.  IMPRESSION: Stable left parapelvic cysts compared to prior exam.  Probable exophytic uterine fibroid which is unchanged compared to prior exam.  No hydronephrosis or renal obstruction is noted. Degenerative disc disease of T12-L1 and L3-4 is noted.   Original Report Authenticated By: Lupita Raider.,  M.D.   Ct Head Wo Contrast  05/14/2013   *RADIOLOGY REPORT*  Clinical Data:  Dementia and found on floor.  CT HEAD WITHOUT CONTRAST CT CERVICAL SPINE WITHOUT CONTRAST  Technique:  Multidetector CT imaging of the head and cervical spine was performed following the standard protocol without intravenous contrast.  Multiplanar CT image reconstructions of the cervical spine were also generated.  Comparison:  01/25/2013  CT HEAD  Findings: No evidence for acute hemorrhage, mass lesion, midline shift, hydrocephalus or large infarct.  The visualized paranasal sinuses are clear.  No acute bony abnormality. There is subtle low density in the periventricular and subcortical white matter. Findings are suggestive for chronic changes.  IMPRESSION: No acute intracranial abnormality.  CT CERVICAL SPINE  Findings: Lung apices are clear.  Multilevel cervical spondylosis. No acute bony abnormality.  The thyroid tissue is heterogeneous.  1 cm nodule in the left thyroid lobe.  Normal alignment of the cervical spine.  IMPRESSION: Multilevel cervical spondylosis.  No acute bony abnormality.   Original Report Authenticated By: Richarda Overlie, M.D.   Ct Cervical Spine Wo Contrast  05/14/2013   *RADIOLOGY REPORT*  Clinical Data:  Dementia and found on floor.  CT HEAD WITHOUT CONTRAST  CT CERVICAL SPINE WITHOUT CONTRAST  Technique:  Multidetector CT imaging of the head and cervical spine was performed following the standard protocol without intravenous contrast.  Multiplanar CT image reconstructions of the cervical spine were also generated.  Comparison:  01/25/2013  CT HEAD  Findings: No evidence for acute hemorrhage, mass lesion, midline shift, hydrocephalus or large infarct.  The visualized paranasal sinuses are clear.  No acute bony abnormality. There is subtle low density in the periventricular and subcortical white matter. Findings are suggestive for chronic changes.  IMPRESSION: No acute intracranial abnormality.  CT CERVICAL SPINE  Findings: Lung apices are clear.  Multilevel cervical spondylosis. No acute bony abnormality.  The thyroid tissue is heterogeneous.  1 cm nodule in the left thyroid lobe.  Normal alignment of the cervical spine.  IMPRESSION: Multilevel cervical spondylosis.  No acute bony abnormality.   Original Report Authenticated By: Richarda Overlie, M.D.   Dg Chest Port 1 View  05/14/2013   *RADIOLOGY REPORT*  Clinical Data: Fever, urinary incontinence, fall  PORTABLE CHEST - 1 VIEW  Comparison: 10/30/2012  Findings: Normal heart size.  Atherosclerotic calcification of the aortic arch.  Normal hilar contours.  Normal pulmonary vascularity. Lung volumes slightly low but the lungs are clear.  Negative for pleural effusion or pneumothorax.  No acute osseous abnormality.  IMPRESSION: No acute cardiopulmonary disease.   Original Report Authenticated By: Britta Mccreedy, M.D.     Assessment/Plan  Active Problems: Perianal abscess   DM (diabetes mellitus)   Acute on chronic CKD (chronic kidney disease) stage 3, GFR 30-59 ml/min   Hypertension   Chronic anemia   Rhabdomyolysis, mild     Heat exhaustion   Fall   PLAN: Admit this lady for hydration; her BNP is elevated and she does have a history of heart failure but she looks clinically dry; Reassess fluid status in the  morning Empirically start clindamycin and a probiotic, pending incision and drainage of perianal abscess Monitor hemoglobin and blood pressure for evidence of bleed Check serum B12; it was noted to have borderline low in the past and patient's is apparent not taking the supplements although she carries a history of dementia Continue antihypertensives A sliding scale insulin, but will temporarily hold long-acting insulin given dehydration and temporary deterioration from baseline renal function   Other plans as per orders.  Code Status: Full code  Family Communication: No family available Disposition Plan: Depending on hospital course   Jerriyah Louis Nocturnist [facility] Pager [phone]   05/14/2013, 9:04 PM

## 2013-05-14 NOTE — ED Provider Notes (Addendum)
History     CSN: [id number]  Arrival date & time 05/14/13  4098   First MD Initiated Contact with Patient 05/14/13 1815      Chief Complaint  Patient presents with  . Fever  . Urinary Incontinence  . Fall    (Consider location/radiation/quality/duration/timing/severity/associated sxs/prior treatment) Patient is a 79 y.o. female presenting with fever and fall. The history is limited by the condition of the patient.  Fever Associated symptoms: confusion   Fall   level V caveat applies due to confusion. Patient brought in by EMS. EMS reported to the house was extremely hot. Patient was found on the floor extra chair unable to get up. Patient claims that she had been laying there all day long. She had wet herself. Patient noted a temp of 103. Patient temperature here was 102. Patient has a history of some confusion. However she is alert. Patient was complaining of her back hurting. Patient was brought in fully immobilized on long spine board and c-collar.  Past Medical History  Diagnosis Date  . CAD (coronary artery disease) 2003    Last catheterization 2008. Two-vessel PCI of the first OM branch and mid LAD.  Marland Kitchen CVA (cerebral vascular accident) 1996    LEFT SIDE   . IDDM (insulin dependent diabetes mellitus)   . CKD (chronic kidney disease)   . Myocardial infarction   . Congestive heart failure   . Hypercholesteremia   . Hypertension   . Dementia     Past Surgical History  Procedure Laterality Date  . Appendectomy    . Knee surgery      Family History  Problem Relation Age of Onset  . Diabetes Brother     RETINOPATHY   . Drug abuse Brother   . Liver cancer Brother   . Coronary artery disease Mother   . Breast cancer Daughter   . Heart attack Mother   . Heart attack Father   . Coronary artery disease Father   . Diabetes Sister     History  Substance Use Topics  . Smoking status: Never Smoker   . Smokeless tobacco: Not on file  . Alcohol Use: No    OB  History   Grav Para Term Preterm Abortions TAB SAB Ect Mult Living                  Review of Systems  Unable to perform ROS Constitutional: Positive for fever.  Psychiatric/Behavioral: Positive for confusion.   level V caveat applies due to confusion.  Allergies  Propoxyphene-acetaminophen and Simvastatin  Home Medications   Current Outpatient Rx  Name  Route  Sig  Dispense  Refill  . amLODipine (NORVASC) 10 MG tablet   Oral   Take 10 mg by mouth daily.         Marland Kitchen aspirin EC 81 MG tablet   Oral   Take 81 mg by mouth daily.         . furosemide (LASIX) 40 MG tablet   Oral   Take 40 mg by mouth daily.         . insulin NPH-regular (NOVOLIN 70/30) (70-30) 100 UNIT/ML injection   Subcutaneous   Inject 25 Units into the skin 2 (two) times daily.         . isosorbide mononitrate (IMDUR) 120 MG 24 hr tablet   Oral   Take 120 mg by mouth daily.         . meclizine (ANTIVERT) 25 MG tablet  Oral   Take 25 mg by mouth 3 (three) times daily as needed.         Marland Kitchen acetaminophen-codeine (TYLENOL #3) 300-30 MG per tablet   Oral   Take 1 tablet by mouth every 6 (six) hours as needed for pain.   45 tablet   0   . atorvastatin (LIPITOR) 40 MG tablet   Oral   Take 40 mg by mouth at bedtime.         . cloNIDine (CATAPRES - DOSED IN MG/24 HR) 0.2 mg/24hr patch   Transdermal   Place 1 patch onto the skin once a week.         . fenofibrate 54 MG tablet   Oral   Take 1 tablet (54 mg total) by mouth daily.   30 tablet   3   . insulin NPH (HUMULIN N,NOVOLIN N) 100 UNIT/ML injection   Subcutaneous   Inject 20-25 Units into the skin 2 (two) times daily. Patient takes 25 units in the morning and 20 units at night         . iron polysaccharides (NIFEREX) 150 MG capsule   Oral   Take 1 capsule (150 mg total) by mouth 2 (two) times daily.   60 capsule   0   . labetalol (NORMODYNE) 200 MG tablet   Oral   Take 200 mg by mouth 2 (two) times daily.           Marland Kitchen lisinopril (PRINIVIL,ZESTRIL) 40 MG tablet   Oral   Take 40 mg by mouth daily. Given by Dr Kristian Covey         . NIFEdipine (PROCARDIA XL/ADALAT-CC) 60 MG 24 hr tablet   Oral   Take 60 mg by mouth daily.         . nitroGLYCERIN (NITROSTAT) 0.4 MG SL tablet   Sublingual   Place 1 tablet (0.4 mg total) under the tongue every 5 (five) minutes x 3 doses as needed for chest pain.   30 tablet   0   . omega-3 acid ethyl esters (LOVAZA) 1 G capsule   Oral   Take 1 capsule (1 g total) by mouth 2 (two) times daily.   60 capsule   0   . Omega-3 Fatty Acids (SUPER OMEGA-3) 1000 MG CAPS   Oral   Take 1 capsule by mouth 2 (two) times daily.         Bertram Gala Glycol-Propyl Glycol (SYSTANE OP)   Ophthalmic   Apply 1 drop to eye daily.             BP 168/52  Pulse 88  Temp(Src) 101.6 F (38.7 C) (Oral)  Resp 20  Ht 5\' 4"  (1.626 m)  Wt 162 lb (73.483 kg)  BMI 27.79 kg/m2  SpO2 98%  Physical Exam  Constitutional: She appears well-developed and well-nourished.  HENT:  Head: Normocephalic and atraumatic.  Eyes: Conjunctivae and EOM are normal. Pupils are equal, round, and reactive to light.  Neck:  C-collar in place.  Cardiovascular: Normal rate, regular rhythm and normal heart sounds.   No murmur heard. Pulmonary/Chest: Effort normal and breath sounds normal. No respiratory distress.  Abdominal: Soft. Bowel sounds are normal. There is no tenderness.  Genitourinary:  Right perianal area with a 4 cm area of induration and erythema questionable fluctuance. His right long the perianal area does move up into her inferior posterior aspect of the labia on the right side.  Musculoskeletal: Normal range of motion. She exhibits no  edema and no tenderness.  Neurological: She is alert. No cranial nerve deficit. She exhibits normal muscle tone. Coordination normal.  But confused.  Skin: Skin is warm. No rash noted. There is erythema.    ED Course  Procedures (including critical  care time)  Labs Reviewed  CBC WITH DIFFERENTIAL - Abnormal; Notable for the following:    RBC 3.29 (*)    Hemoglobin 9.8 (*)    HCT 29.4 (*)    Neutrophils Relative % 79 (*)    Lymphocytes Relative 5 (*)    Lymphs Abs 0.4 (*)    Monocytes Relative 16 (*)    Monocytes Absolute 1.4 (*)    All other components within normal limits  COMPREHENSIVE METABOLIC PANEL - Abnormal; Notable for the following:    Sodium 133 (*)    Glucose, Bld 164 (*)    BUN 46 (*)    Creatinine, Ser 3.11 (*)    Alkaline Phosphatase 38 (*)    GFR calc non Af Amer 13 (*)    GFR calc Af Amer 15 (*)    All other components within normal limits  PRO B NATRIURETIC PEPTIDE - Abnormal; Notable for the following:    Pro B Natriuretic peptide (BNP) 4841.0 (*)    All other components within normal limits  PROTIME-INR  TROPONIN I  URINALYSIS, ROUTINE W REFLEX MICROSCOPIC   Ct Abdomen Pelvis Wo Contrast  05/14/2013   *RADIOLOGY REPORT*  Clinical Data: Periumbilical abscess  CT ABDOMEN AND PELVIS WITHOUT CONTRAST  Technique:  Multidetector CT imaging of the abdomen and pelvis was performed following the standard protocol without intravenous contrast.  Comparison: March 21, 2008.  Findings: Severe degenerative disc disease is noted at T12-L1. Severe degenerative disc disease is noted at L3-4 with grade 1 anterolisthesis secondary to posterior facet joint hypertrophy. Visualized lung bases appear normal.  No focal abnormality seen involving the liver, spleen or pancreas. No gallstones are noted. Adrenal glands appear normal.  No renal or ureteral calculi are noted.  Left-sided peripelvic cysts are again noted.  No definite hydronephrosis or renal obstruction is noted.  Status post appendectomy.  Urinary bladder appears normal.  Probable exophytic fibroid is seen arising from uterine fundus which is unchanged compared to prior exam.  Atherosclerotic calcifications of abdominal aorta are noted.  IMPRESSION: Stable left parapelvic  cysts compared to prior exam.  Probable exophytic uterine fibroid which is unchanged compared to prior exam.  No hydronephrosis or renal obstruction is noted. Degenerative disc disease of T12-L1 and L3-4 is noted.   Original Report Authenticated By: Lupita Raider.,  M.D.   Ct Head Wo Contrast  05/14/2013   *RADIOLOGY REPORT*  Clinical Data:  Dementia and found on floor.  CT HEAD WITHOUT CONTRAST CT CERVICAL SPINE WITHOUT CONTRAST  Technique:  Multidetector CT imaging of the head and cervical spine was performed following the standard protocol without intravenous contrast.  Multiplanar CT image reconstructions of the cervical spine were also generated.  Comparison:  01/25/2013  CT HEAD  Findings: No evidence for acute hemorrhage, mass lesion, midline shift, hydrocephalus or large infarct.  The visualized paranasal sinuses are clear.  No acute bony abnormality. There is subtle low density in the periventricular and subcortical white matter. Findings are suggestive for chronic changes.  IMPRESSION: No acute intracranial abnormality.  CT CERVICAL SPINE  Findings: Lung apices are clear.  Multilevel cervical spondylosis. No acute bony abnormality.  The thyroid tissue is heterogeneous.  1 cm nodule in the left thyroid lobe.  Normal alignment of the cervical spine.  IMPRESSION: Multilevel cervical spondylosis.  No acute bony abnormality.   Original Report Authenticated By: Richarda Overlie, M.D.   Ct Cervical Spine Wo Contrast  05/14/2013   *RADIOLOGY REPORT*  Clinical Data:  Dementia and found on floor.  CT HEAD WITHOUT CONTRAST CT CERVICAL SPINE WITHOUT CONTRAST  Technique:  Multidetector CT imaging of the head and cervical spine was performed following the standard protocol without intravenous contrast.  Multiplanar CT image reconstructions of the cervical spine were also generated.  Comparison:  01/25/2013  CT HEAD  Findings: No evidence for acute hemorrhage, mass lesion, midline shift, hydrocephalus or large infarct.   The visualized paranasal sinuses are clear.  No acute bony abnormality. There is subtle low density in the periventricular and subcortical white matter. Findings are suggestive for chronic changes.  IMPRESSION: No acute intracranial abnormality.  CT CERVICAL SPINE  Findings: Lung apices are clear.  Multilevel cervical spondylosis. No acute bony abnormality.  The thyroid tissue is heterogeneous.  1 cm nodule in the left thyroid lobe.  Normal alignment of the cervical spine.  IMPRESSION: Multilevel cervical spondylosis.  No acute bony abnormality.   Original Report Authenticated By: Richarda Overlie, M.D.   Dg Chest Port 1 View  05/14/2013   *RADIOLOGY REPORT*  Clinical Data: Fever, urinary incontinence, fall  PORTABLE CHEST - 1 VIEW  Comparison: 10/30/2012  Findings: Normal heart size.  Atherosclerotic calcification of the aortic arch.  Normal hilar contours.  Normal pulmonary vascularity. Lung volumes slightly low but the lungs are clear.  Negative for pleural effusion or pneumothorax.  No acute osseous abnormality.  IMPRESSION: No acute cardiopulmonary disease.   Original Report Authenticated By: Britta Mccreedy, M.D.   Results for orders placed during the hospital encounter of 05/14/13  CBC WITH DIFFERENTIAL      Result Value Range   WBC 8.9  4.0 - 10.5 K/uL   RBC 3.29 (*) 3.87 - 5.11 MIL/uL   Hemoglobin 9.8 (*) 12.0 - 15.0 g/dL   HCT 40.9 (*) 81.1 - 91.4 %   MCV 89.4  78.0 - 100.0 fL   MCH 29.8  26.0 - 34.0 pg   MCHC 33.3  30.0 - 36.0 g/dL   RDW 78.2  95.6 - 21.3 %   Platelets 159  150 - 400 K/uL   Neutrophils Relative % 79 (*) 43 - 77 %   Neutro Abs 7.0  1.7 - 7.7 K/uL   Lymphocytes Relative 5 (*) 12 - 46 %   Lymphs Abs 0.4 (*) 0.7 - 4.0 K/uL   Monocytes Relative 16 (*) 3 - 12 %   Monocytes Absolute 1.4 (*) 0.1 - 1.0 K/uL   Eosinophils Relative 0  0 - 5 %   Eosinophils Absolute 0.0  0.0 - 0.7 K/uL   Basophils Relative 0  0 - 1 %   Basophils Absolute 0.0  0.0 - 0.1 K/uL  COMPREHENSIVE METABOLIC  PANEL      Result Value Range   Sodium 133 (*) 135 - 145 mEq/L   Potassium 4.0  3.5 - 5.1 mEq/L   Chloride 96  96 - 112 mEq/L   CO2 24  19 - 32 mEq/L   Glucose, Bld 164 (*) 70 - 99 mg/dL   BUN 46 (*) 6 - 23 mg/dL   Creatinine, Ser 0.86 (*) 0.50 - 1.10 mg/dL   Calcium 57.8  8.4 - 46.9 mg/dL   Total Protein 7.6  6.0 - 8.3 g/dL   Albumin 4.1  3.5 - 5.2 g/dL   AST 14  0 - 37 U/L   ALT 11  0 - 35 U/L   Alkaline Phosphatase 38 (*) 39 - 117 U/L   Total Bilirubin 0.7  0.3 - 1.2 mg/dL   GFR calc non Af Amer 13 (*) >90 mL/min   GFR calc Af Amer 15 (*) >90 mL/min  PROTIME-INR      Result Value Range   Prothrombin Time 15.0  11.6 - 15.2 seconds   INR 1.20  0.00 - 1.49  PRO B NATRIURETIC PEPTIDE      Result Value Range   Pro B Natriuretic peptide (BNP) 4841.0 (*) 0 - 450 pg/mL  TROPONIN I      Result Value Range   Troponin I <0.30  <0.30 ng/mL    Date: 05/14/2013  Rate: 82  Rhythm: normal sinus rhythm  QRS Axis: normal  Intervals: normal  ST/T Wave abnormalities: nonspecific ST/T changes  Conduction Disutrbances:first-degree A-V block   Narrative Interpretation:   Old EKG Reviewed: unchanged No significant changes in EKG compared to 10/26/2012 however there are some fusion complexes present. Patient's troponin was negative.    1. Fall, initial encounter   2. Heat exhaustion, initial encounter   3. Renal insufficiency   4. Perianal abscess       MDM  Patient brought in by EMS. The patient had fallen and was laying on the floor That the her home where was extremely hot was 103. Patient was at home by herself does live with a family member who is gone most of the day. Workup so far without specific evidence of infection urinalysis is still pending. Could possibly be a source chest x-ray negative for pneumonia. Concern could be 2 discrete be from heat exhaustion over exposure to the heat. Patient does not have a significant leukocytosis. Troponin was negative no evidence of an acute  cardiac event liver function tests without significant abnormalities. Patient does have renal insufficiency that has been present in the past. The intranasal sniffing we changed. On examination patient has a right-sided perianal or perirectal abscess. CT scan was done to try to further evaluate this but due to her renal function cannot be done with contrast so not really identified on the CT scan. May just be perianal but have not ruled out being perirectal. Fever could be related to this. I&D R. valuation of this by general surgery would probably be important.  Patient's BUN and creatinine unchanged compared to the end of November but is a little bit worse compared to the creatinine prior to that. Also urinalysis shows some mild hemoglobin without a lot of red blood cells be consistent with some myoglobinuria. And therefore some mild rhabdomyolysis. Hour the urine is not dark in color it is clear. Urinalysis is still pending.  Was discussed with hospitalist about admission and probably social service consult about the home environment. An evaluation by general surgery for the perianal perirectal abscess.        Shelda Jakes, MD 05/14/13 2055  Shelda Jakes, MD 05/14/13 1610  Shelda Jakes, MD 05/14/13 2112  Shelda Jakes, MD 05/14/13 2113

## 2013-05-14 NOTE — ED Notes (Signed)
EMS reports pt has had urinary incontinence since SUnday.  Reports today fell out of chair.  C/O pain in left knee, leg, and lower back.  Pt alert, answering questions.  Reports leg pain has been going on for " a while."   EMS reports pt's house was extrememly hot, reports pt had temp of 103.  EMS administered bolus NSS.  Pt's temp decreased to 102 upon arrival to ED>  Pt fully immobilized on LSB by EMS>

## 2013-05-15 DIAGNOSIS — E663 Overweight: Secondary | ICD-10-CM | POA: Diagnosis present

## 2013-05-15 DIAGNOSIS — E871 Hypo-osmolality and hyponatremia: Secondary | ICD-10-CM | POA: Diagnosis present

## 2013-05-15 DIAGNOSIS — N289 Disorder of kidney and ureter, unspecified: Secondary | ICD-10-CM

## 2013-05-15 DIAGNOSIS — W19XXXA Unspecified fall, initial encounter: Secondary | ICD-10-CM

## 2013-05-15 LAB — CBC
MCH: 30.3 pg (ref 26.0–34.0)
MCHC: 33.5 g/dL (ref 30.0–36.0)
MCV: 90.7 fL (ref 78.0–100.0)
Platelets: 156 10*3/uL (ref 150–400)
RBC: 3 MIL/uL — ABNORMAL LOW (ref 3.87–5.11)

## 2013-05-15 LAB — MAGNESIUM: Magnesium: 1.9 mg/dL (ref 1.5–2.5)

## 2013-05-15 LAB — BASIC METABOLIC PANEL
BUN: 44 mg/dL — ABNORMAL HIGH (ref 6–23)
Calcium: 9.5 mg/dL (ref 8.4–10.5)
Creatinine, Ser: 2.79 mg/dL — ABNORMAL HIGH (ref 0.50–1.10)
GFR calc Af Amer: 18 mL/min — ABNORMAL LOW (ref >90)
GFR calc non Af Amer: 15 mL/min — ABNORMAL LOW (ref >90)
Glucose, Bld: 145 mg/dL — ABNORMAL HIGH (ref 70–99)

## 2013-05-15 LAB — VITAMIN B12: Vitamin B-12: 244 pg/mL (ref 211–911)

## 2013-05-15 LAB — HEPATIC FUNCTION PANEL
ALT: 9 U/L (ref 0–35)
Alkaline Phosphatase: 32 U/L — ABNORMAL LOW (ref 39–117)
Bilirubin, Direct: 0.2 mg/dL (ref 0.0–0.3)
Indirect Bilirubin: 0.5 mg/dL (ref 0.3–0.9)
Total Bilirubin: 0.7 mg/dL (ref 0.3–1.2)
Total Protein: 6.9 g/dL (ref 6.0–8.3)

## 2013-05-15 LAB — PREPARE RBC (CROSSMATCH)

## 2013-05-15 LAB — CK: Total CK: 428 U/L — ABNORMAL HIGH (ref 7–177)

## 2013-05-15 LAB — GLUCOSE, CAPILLARY
Glucose-Capillary: 198 mg/dL — ABNORMAL HIGH (ref 70–99)
Glucose-Capillary: 228 mg/dL — ABNORMAL HIGH (ref 70–99)

## 2013-05-15 MED ORDER — LIDOCAINE HCL (PF) 1 % IJ SOLN
INTRAMUSCULAR | Status: AC
  Administered 2013-05-15: 10 mL
  Filled 2013-05-15: qty 5

## 2013-05-15 MED ORDER — SODIUM CHLORIDE 0.9 % IV SOLN
1.5000 g | Freq: Two times a day (BID) | INTRAVENOUS | Status: DC
  Administered 2013-05-15 – 2013-05-16 (×2): 1.5 g via INTRAVENOUS
  Filled 2013-05-15 (×3): qty 1.5

## 2013-05-15 MED ORDER — FUROSEMIDE 40 MG PO TABS
40.0000 mg | ORAL_TABLET | Freq: Every day | ORAL | Status: DC
  Administered 2013-05-16 – 2013-05-18 (×3): 40 mg via ORAL
  Filled 2013-05-15 (×3): qty 1

## 2013-05-15 MED ORDER — LIDOCAINE HCL (PF) 1 % IJ SOLN
INTRAMUSCULAR | Status: AC
  Filled 2013-05-15: qty 5

## 2013-05-15 NOTE — Progress Notes (Signed)
Utilization Review Complete  

## 2013-05-15 NOTE — Evaluation (Signed)
Physical Therapy Evaluation Patient Details Name: [name] MRN: [id number] DOB: 25-Oct-1932 Today's Date: 05/15/2013 Time: 4098-1191 PT Time Calculation (min): 22 min  PT Assessment / Plan / Recommendation Clinical Impression  Pt was seen for a limited evaluation.  She appears very lethergic and keeps eyes closed most of the time of my visit.  She lives alone and is active at home, independent with all ADLs including driving.  Daughter states that when pt is not doing something that she sleeps in a recliner.  Currently,, pt was agreeable to initiate PT eval where she demonstrated decreased strength in both quadriceps which probably causes some gait instabiity.  She refused to participate further, for no specific reason.  It will be impossible for me to make any discharge recommendations until I can evaluate her mobility.  She promises  to work with me tomorrow.    PT Assessment  Patient needs continued PT services    Follow Up Recommendations   (to be determined)    Does the patient have the potential to tolerate intense rehabilitation      Barriers to Discharge Inaccessible home environment;Decreased caregiver support 10 steps to entrance of home, lives alone    Equipment Recommendations  None recommended by PT    Recommendations for Other Services     Frequency Min 3X/week    Precautions / Restrictions Restrictions Weight Bearing Restrictions: No   Pertinent Vitals/Pain       Mobility  Bed Mobility Bed Mobility: Not assessed Details for Bed Mobility Assistance: pt refuses to move in the bed or transfer OOB...doesn't know why and has no specific c/o    Exercises     PT Diagnosis:    PT Problem List: Decreased strength PT Treatment Interventions: Gait training;Stair training;Functional mobility training;Therapeutic exercise;Patient/family education   PT Goals Acute Rehab PT Goals PT Goal Formulation: With patient Time For Goal Achievement: 05/29/13 Potential to  Achieve Goals: Good Pt will go Supine/Side to Sit: with modified independence;with HOB 0 degrees PT Goal: Supine/Side to Sit - Progress: Goal set today Pt will go Sit to Supine/Side: with modified independence;with HOB 0 degrees PT Goal: Sit to Supine/Side - Progress: Goal set today Pt will go Sit to Stand: with modified independence;with upper extremity assist PT Goal: Sit to Stand - Progress: Goal set today Pt will go Stand to Sit: with modified independence;with upper extremity assist PT Goal: Stand to Sit - Progress: Goal set today Pt will Ambulate: 16 - 50 feet;with least restrictive assistive device;with supervision PT Goal: Ambulate - Progress: Goal set today Pt will Go Up / Down Stairs: Flight;with supervision;with rail(s) PT Goal: Up/Down Stairs - Progress: Goal set today  Visit Information  Last PT Received On: 05/15/13    Subjective Data  Subjective: I'm cold Patient Stated Goal: none stated   Prior Functioning  Home Living Lives With: Alone Available Help at Discharge: Family;Available PRN/intermittently Type of Home: House Home Access: Stairs to enter [employer] of Steps: 10 Entrance Stairs-Rails: Right Home Layout: One level Bathroom Shower/Tub: Engineer, manufacturing systems: Standard Home Adaptive Equipment: Walker - rolling Prior Function Level of Independence: Independent Able to Take Stairs?: Yes Driving: Yes Comments: pt is active at home and even cleans her son's house Communication Communication: No difficulties    Cognition  Cognition Arousal/Alertness: Awake/alert Behavior During Therapy: WFL for tasks assessed/performed Overall Cognitive Status: Within Functional Limits for tasks assessed    Extremity/Trunk Assessment Right Upper Extremity Assessment RUE ROM/Strength/Tone: Within functional levels Left Upper  Extremity Assessment LUE ROM/Strength/Tone: Within functional levels Right Lower Extremity Assessment RLE  ROM/Strength/Tone: Deficits RLE ROM/Strength/Tone Deficits: strength of quadriceps =3-/5, otherwise generally 4-/5 RLE Sensation: WFL - Light Touch Left Lower Extremity Assessment LLE ROM/Strength/Tone: Deficits LLE ROM/Strength/Tone Deficits: quadriceps = 3-/5, otherwise generally 4-/5 LLE Sensation: WFL - Light Touch   Balance    End of Session PT - End of Session Activity Tolerance: Other (comment) (tx limited by lack of cooperation) Patient left: in bed;with bed alarm set;with call bell/phone within reach;with family/visitor present  GP     Myrlene Broker L 05/15/2013, 11:36 AM

## 2013-05-15 NOTE — Care Management Note (Signed)
    Page 1 of 2   05/17/2013     10:37:04 AM   CARE MANAGEMENT NOTE 05/17/2013  Patient:  [name], [name]   Account Number:  [id number]  Date Initiated:  05/15/2013  Documentation initiated by:  Rosemary Holms  Subjective/Objective Assessment:   Pt admitted from home where she reportedly lives with son. PT consult needed for DC disposition.     Action/Plan:   Anticipated DC Date:  05/18/2013   Anticipated DC Plan:  HOME W HOME HEALTH SERVICES      DC Planning Services  CM consult      [facility] Choice  HOME HEALTH   Choice offered to / List presented to:  C-1 Patient        HH arranged  HH-2 PT      [facility] agency  [facility]   Status of service:  Completed, signed off Medicare Important Message given?  YES (If response is "NO", the following Medicare IM given date fields will be blank) Date Medicare IM given:  05/17/2013 Date Additional Medicare IM given:    Discharge Disposition:  HOME W HOME HEALTH SERVICES  Per UR Regulation:    If discussed at Long Length of Stay Meetings, dates discussed:    Comments:  05/17/13 1030 Damien Batty RN BSN CM Pt agreeable to [facility] PT and selected [facility]. Rolling Walker being delivered to pt today for anticipated DC tomorrow.  05/16/13 1120 Kaislee Chao RN BSN CM Pt would not work w/ PT yesterday. CM stressed that if she wanted to go home she had to prove to the MD that she could maneuver at home. Pt states she would. Does not want SNF or [facility]  05/15/13 Dajana Gehrig Leanord Hawking RN BSN CM

## 2013-05-15 NOTE — Consult Note (Signed)
Reason for Consult:Perirectal abscess Referring Physician: [facility]  [name] is an 79 y.o. female.  HPI: Patient presented to [facility] after being found down by her son.  The patient is somewhat of a poor historian regarding events and perirectal changes.  Apparently patient lives with her son and was home alone.  When he returned she was found laying on the floor.  She denies any fever or chills.  She denies any perirectal pain.  She states she has not had any similar lesions in the past.  Initially states she was unaware of the abscess and then states it has only been there for 2 days or less.    Past Medical History  Diagnosis Date  . CAD (coronary artery disease) 2003    Last catheterization 2008. Two-vessel PCI of the first OM branch and mid LAD.  Marland Kitchen CVA (cerebral vascular accident) 1996    LEFT SIDE   . IDDM (insulin dependent diabetes mellitus)   . CKD (chronic kidney disease)   . Myocardial infarction   . Congestive heart failure   . Hypercholesteremia   . Hypertension   . Dementia     Past Surgical History  Procedure Laterality Date  . Appendectomy    . Knee surgery      Family History  Problem Relation Age of Onset  . Diabetes Brother     RETINOPATHY   . Drug abuse Brother   . Liver cancer Brother   . Coronary artery disease Mother   . Breast cancer Daughter   . Heart attack Mother   . Heart attack Father   . Coronary artery disease Father   . Diabetes Sister     Social History:  reports that she has never smoked. She does not have any smokeless tobacco history on file. She reports that she does not drink alcohol or use illicit drugs.  Allergies:  Allergies  Allergen Reactions  . Propoxyphene-Acetaminophen   . Simvastatin Other (See Comments)    headache    Medications:  I have reviewed the patient's current medications. Prior to Admission:  Prescriptions prior to admission  Medication Sig Dispense Refill  . fish oil-omega-3 fatty acids  1000 MG capsule Take 1 g by mouth daily.      Marland Kitchen amLODipine (NORVASC) 10 MG tablet Take 10 mg by mouth daily.      Marland Kitchen aspirin EC 81 MG tablet Take 81 mg by mouth daily.      Marland Kitchen atorvastatin (LIPITOR) 40 MG tablet Take 40 mg by mouth at bedtime.      . fenofibrate 54 MG tablet Take 1 tablet (54 mg total) by mouth daily.  30 tablet  3  . furosemide (LASIX) 40 MG tablet Take 40 mg by mouth daily.      . insulin NPH-regular (NOVOLIN 70/30) (70-30) 100 UNIT/ML injection Inject 25 Units into the skin 2 (two) times daily.      . iron polysaccharides (NIFEREX) 150 MG capsule Take 1 capsule (150 mg total) by mouth 2 (two) times daily.  60 capsule  0  . isosorbide mononitrate (IMDUR) 120 MG 24 hr tablet Take 120 mg by mouth daily.      Marland Kitchen labetalol (NORMODYNE) 200 MG tablet Take 200 mg by mouth 2 (two) times daily.       Marland Kitchen lisinopril (PRINIVIL,ZESTRIL) 40 MG tablet Take 40 mg by mouth daily. Given by Dr Kristian Covey      . nitroGLYCERIN (NITROSTAT) 0.4 MG SL tablet Place 1 tablet (0.4  mg total) under the tongue every 5 (five) minutes x 3 doses as needed for chest pain.  30 tablet  0   Scheduled: . acidophilus  2 capsule Oral Daily  . amLODipine  10 mg Oral Daily  . ampicillin-sulbactam (UNASYN) IV  1.5 g Intravenous Q12H  . atorvastatin  40 mg Oral QHS  . cloNIDine  0.2 mg Transdermal Weekly  . docusate sodium  100 mg Oral BID  . fenofibrate  54 mg Oral Daily  . [START ON 05/16/2013] furosemide  40 mg Oral Daily  . insulin aspart  0-5 Units Subcutaneous QHS  . insulin aspart  0-9 Units Subcutaneous TID WC  . isosorbide mononitrate  120 mg Oral Daily  . labetalol  200 mg Oral BID  . omega-3 acid ethyl esters  1 g Oral BID    Results for orders placed during the hospital encounter of 05/14/13 (from the past 48 hour(s))  CBC WITH DIFFERENTIAL     Status: Abnormal   Collection Time    05/14/13  6:32 PM      Result Value Range   WBC 8.9  4.0 - 10.5 K/uL   RBC 3.29 (*) 3.87 - 5.11 MIL/uL   Hemoglobin 9.8  (*) 12.0 - 15.0 g/dL   HCT 45.4 (*) 09.8 - 11.9 %   MCV 89.4  78.0 - 100.0 fL   MCH 29.8  26.0 - 34.0 pg   MCHC 33.3  30.0 - 36.0 g/dL   RDW 14.7  82.9 - 56.2 %   Platelets 159  150 - 400 K/uL   Neutrophils Relative % 79 (*) 43 - 77 %   Neutro Abs 7.0  1.7 - 7.7 K/uL   Lymphocytes Relative 5 (*) 12 - 46 %   Lymphs Abs 0.4 (*) 0.7 - 4.0 K/uL   Monocytes Relative 16 (*) 3 - 12 %   Monocytes Absolute 1.4 (*) 0.1 - 1.0 K/uL   Eosinophils Relative 0  0 - 5 %   Eosinophils Absolute 0.0  0.0 - 0.7 K/uL   Basophils Relative 0  0 - 1 %   Basophils Absolute 0.0  0.0 - 0.1 K/uL  COMPREHENSIVE METABOLIC PANEL     Status: Abnormal   Collection Time    05/14/13  6:32 PM      Result Value Range   Sodium 133 (*) 135 - 145 mEq/L   Potassium 4.0  3.5 - 5.1 mEq/L   Chloride 96  96 - 112 mEq/L   CO2 24  19 - 32 mEq/L   Glucose, Bld 164 (*) 70 - 99 mg/dL   BUN 46 (*) 6 - 23 mg/dL   Creatinine, Ser 1.30 (*) 0.50 - 1.10 mg/dL   Calcium 86.5  8.4 - 78.4 mg/dL   Total Protein 7.6  6.0 - 8.3 g/dL   Albumin 4.1  3.5 - 5.2 g/dL   AST 14  0 - 37 U/L   ALT 11  0 - 35 U/L   Alkaline Phosphatase 38 (*) 39 - 117 U/L   Total Bilirubin 0.7  0.3 - 1.2 mg/dL   GFR calc non Af Amer 13 (*) >90 mL/min   GFR calc Af Amer 15 (*) >90 mL/min   Comment:            The eGFR has been calculated     using the CKD EPI equation.     This calculation has not been     validated in all clinical  situations.     eGFR's persistently     <90 mL/min signify     possible Chronic Kidney Disease.  PROTIME-INR     Status: None   Collection Time    05/14/13  6:32 PM      Result Value Range   Prothrombin Time 15.0  11.6 - 15.2 seconds   INR 1.20  0.00 - 1.49  PRO B NATRIURETIC PEPTIDE     Status: Abnormal   Collection Time    05/14/13  6:32 PM      Result Value Range   Pro B Natriuretic peptide (BNP) 4841.0 (*) 0 - 450 pg/mL  TROPONIN I     Status: None   Collection Time    05/14/13  6:32 PM      Result Value Range     Troponin I <0.30  <0.30 ng/mL   Comment:            Due to the release kinetics of cTnI,     a negative result within the first hours     of the onset of symptoms does not rule out     myocardial infarction with certainty.     If myocardial infarction is still suspected,     repeat the test at appropriate intervals.  URINALYSIS, ROUTINE W REFLEX MICROSCOPIC     Status: Abnormal   Collection Time    05/14/13  8:38 PM      Result Value Range   Color, Urine YELLOW  YELLOW   APPearance CLEAR  CLEAR   Specific Gravity, Urine 1.020  1.005 - 1.030   pH 6.0  5.0 - 8.0   Glucose, UA NEGATIVE  NEGATIVE mg/dL   Hgb urine dipstick LARGE (*) NEGATIVE   Bilirubin Urine NEGATIVE  NEGATIVE   Ketones, ur NEGATIVE  NEGATIVE mg/dL   Protein, ur 629 (*) NEGATIVE mg/dL   Urobilinogen, UA 0.2  0.0 - 1.0 mg/dL   Nitrite NEGATIVE  NEGATIVE   Leukocytes, UA NEGATIVE  NEGATIVE  URINE MICROSCOPIC-ADD ON     Status: Abnormal   Collection Time    05/14/13  8:38 PM      Result Value Range   Squamous Epithelial / LPF RARE  RARE   RBC / HPF 0-2  <3 RBC/hpf   Bacteria, UA FEW (*) RARE   Urine-Other AMORPHOUS URATES/PHOSPHATES     Comment: RTE PRESENT  LACTIC ACID, PLASMA     Status: None   Collection Time    05/14/13  9:19 PM      Result Value Range   Lactic Acid, Venous 0.7  0.5 - 2.2 mmol/L  CK     Status: Abnormal   Collection Time    05/14/13 11:09 PM      Result Value Range   Total CK 428 (*) 7 - 177 U/L  HEMOGLOBIN A1C     Status: Abnormal   Collection Time    05/14/13 11:09 PM      Result Value Range   Hemoglobin A1C 6.9 (*) <5.7 %   Comment: (NOTE)  According to the ADA Clinical Practice Recommendations for 2011, when     HbA1c is used as a screening test:      >=6.5%   Diagnostic of Diabetes Mellitus               (if abnormal result is confirmed)     5.7-6.4%   Increased risk of developing Diabetes Mellitus      References:Diagnosis and Classification of Diabetes Mellitus,Diabetes     Care,2011,34(Suppl 1):S62-S69 and Standards of Medical Care in             Diabetes - 2011,Diabetes Care,2011,34 (Suppl 1):S11-S61.   Mean Plasma Glucose 151 (*) <117 mg/dL  HEPATIC FUNCTION PANEL     Status: Abnormal   Collection Time    05/14/13 11:09 PM      Result Value Range   Total Protein 6.9  6.0 - 8.3 g/dL   Albumin 3.5  3.5 - 5.2 g/dL   AST 17  0 - 37 U/L   ALT 9  0 - 35 U/L   Alkaline Phosphatase 32 (*) 39 - 117 U/L   Total Bilirubin 0.7  0.3 - 1.2 mg/dL   Bilirubin, Direct 0.2  0.0 - 0.3 mg/dL   Indirect Bilirubin 0.5  0.3 - 0.9 mg/dL  MAGNESIUM     Status: None   Collection Time    05/14/13 11:09 PM      Result Value Range   Magnesium 1.9  1.5 - 2.5 mg/dL  TSH     Status: None   Collection Time    05/14/13 11:09 PM      Result Value Range   TSH 1.847  0.350 - 4.500 uIU/mL  GLUCOSE, CAPILLARY     Status: Abnormal   Collection Time    05/14/13 11:36 PM      Result Value Range   Glucose-Capillary 137 (*) 70 - 99 mg/dL  OCCULT BLOOD X 1 CARD TO LAB, STOOL     Status: None   Collection Time    05/15/13  2:15 AM      Result Value Range   Fecal Occult Bld NEGATIVE  NEGATIVE  CBC     Status: Abnormal   Collection Time    05/15/13  2:30 AM      Result Value Range   WBC 9.1  4.0 - 10.5 K/uL   RBC 3.00 (*) 3.87 - 5.11 MIL/uL   Hemoglobin 9.1 (*) 12.0 - 15.0 g/dL   HCT 40.9 (*) 81.1 - 91.4 %   MCV 90.7  78.0 - 100.0 fL   MCH 30.3  26.0 - 34.0 pg   MCHC 33.5  30.0 - 36.0 g/dL   RDW 78.2  95.6 - 21.3 %   Platelets 156  150 - 400 K/uL  TYPE AND SCREEN     Status: None   Collection Time    05/15/13  2:30 AM      Result Value Range   ABO/RH(D) A POS     Antibody Screen NEG     Sample Expiration 05/18/2013     Unit Number [id number]     Blood Component Type RED CELLS,LR     Unit division 00     Status of Unit ALLOCATED     Transfusion Status OK TO TRANSFUSE     Crossmatch Result  Compatible     Unit Number [id number]     Blood Component Type RED CELLS,LR     Unit division 00     Status  of Unit ALLOCATED     Transfusion Status OK TO TRANSFUSE     Crossmatch Result Compatible    PREPARE RBC (CROSSMATCH)     Status: None   Collection Time    05/15/13  2:44 AM      Result Value Range   Order Confirmation ORDER PROCESSED BY BLOOD BANK    BASIC METABOLIC PANEL     Status: Abnormal   Collection Time    05/15/13  2:47 AM      Result Value Range   Sodium 132 (*) 135 - 145 mEq/L   Potassium 3.8  3.5 - 5.1 mEq/L   Chloride 98  96 - 112 mEq/L   CO2 21  19 - 32 mEq/L   Glucose, Bld 145 (*) 70 - 99 mg/dL   BUN 44 (*) 6 - 23 mg/dL   Creatinine, Ser 4.54 (*) 0.50 - 1.10 mg/dL   Calcium 9.5  8.4 - 09.8 mg/dL   GFR calc non Af Amer 15 (*) >90 mL/min   GFR calc Af Amer 18 (*) >90 mL/min   Comment:            The eGFR has been calculated     using the CKD EPI equation.     This calculation has not been     validated in all clinical     situations.     eGFR's persistently     <90 mL/min signify     possible Chronic Kidney Disease.  VITAMIN B12     Status: None   Collection Time    05/15/13  6:39 AM      Result Value Range   Vitamin B-12 244  211 - 911 pg/mL  GLUCOSE, CAPILLARY     Status: Abnormal   Collection Time    05/15/13  7:39 AM      Result Value Range   Glucose-Capillary 155 (*) 70 - 99 mg/dL   Comment 1 Notify RN    CLOSTRIDIUM DIFFICILE BY PCR     Status: None   Collection Time    05/15/13 10:35 AM      Result Value Range   C difficile by pcr NEGATIVE  NEGATIVE  GLUCOSE, CAPILLARY     Status: Abnormal   Collection Time    05/15/13 11:58 AM      Result Value Range   Glucose-Capillary 198 (*) 70 - 99 mg/dL   Comment 1 Notify RN    GLUCOSE, CAPILLARY     Status: Abnormal   Collection Time    05/15/13  4:32 PM      Result Value Range   Glucose-Capillary 161 (*) 70 - 99 mg/dL   Comment 1 Notify RN    GLUCOSE, CAPILLARY     Status: Abnormal    Collection Time    05/15/13  8:45 PM      Result Value Range   Glucose-Capillary 228 (*) 70 - 99 mg/dL    Ct Abdomen Pelvis Wo Contrast  05/14/2013   *RADIOLOGY REPORT*  Clinical Data: Periumbilical abscess  CT ABDOMEN AND PELVIS WITHOUT CONTRAST  Technique:  Multidetector CT imaging of the abdomen and pelvis was performed following the standard protocol without intravenous contrast.  Comparison: March 21, 2008.  Findings: Severe degenerative disc disease is noted at T12-L1. Severe degenerative disc disease is noted at L3-4 with grade 1 anterolisthesis secondary to posterior facet joint hypertrophy. Visualized lung bases appear normal.  No focal abnormality seen involving the liver, spleen or pancreas. No  gallstones are noted. Adrenal glands appear normal.  No renal or ureteral calculi are noted.  Left-sided peripelvic cysts are again noted.  No definite hydronephrosis or renal obstruction is noted.  Status post appendectomy.  Urinary bladder appears normal.  Probable exophytic fibroid is seen arising from uterine fundus which is unchanged compared to prior exam.  Atherosclerotic calcifications of abdominal aorta are noted.  IMPRESSION: Stable left parapelvic cysts compared to prior exam.  Probable exophytic uterine fibroid which is unchanged compared to prior exam.  No hydronephrosis or renal obstruction is noted. Degenerative disc disease of T12-L1 and L3-4 is noted.   Original Report Authenticated By: Lupita Raider.,  M.D.   Ct Head Wo Contrast  05/14/2013   *RADIOLOGY REPORT*  Clinical Data:  Dementia and found on floor.  CT HEAD WITHOUT CONTRAST CT CERVICAL SPINE WITHOUT CONTRAST  Technique:  Multidetector CT imaging of the head and cervical spine was performed following the standard protocol without intravenous contrast.  Multiplanar CT image reconstructions of the cervical spine were also generated.  Comparison:  01/25/2013  CT HEAD  Findings: No evidence for acute hemorrhage, mass lesion, midline  shift, hydrocephalus or large infarct.  The visualized paranasal sinuses are clear.  No acute bony abnormality. There is subtle low density in the periventricular and subcortical white matter. Findings are suggestive for chronic changes.  IMPRESSION: No acute intracranial abnormality.  CT CERVICAL SPINE  Findings: Lung apices are clear.  Multilevel cervical spondylosis. No acute bony abnormality.  The thyroid tissue is heterogeneous.  1 cm nodule in the left thyroid lobe.  Normal alignment of the cervical spine.  IMPRESSION: Multilevel cervical spondylosis.  No acute bony abnormality.   Original Report Authenticated By: Richarda Overlie, M.D.   Ct Cervical Spine Wo Contrast  05/14/2013   *RADIOLOGY REPORT*  Clinical Data:  Dementia and found on floor.  CT HEAD WITHOUT CONTRAST CT CERVICAL SPINE WITHOUT CONTRAST  Technique:  Multidetector CT imaging of the head and cervical spine was performed following the standard protocol without intravenous contrast.  Multiplanar CT image reconstructions of the cervical spine were also generated.  Comparison:  01/25/2013  CT HEAD  Findings: No evidence for acute hemorrhage, mass lesion, midline shift, hydrocephalus or large infarct.  The visualized paranasal sinuses are clear.  No acute bony abnormality. There is subtle low density in the periventricular and subcortical white matter. Findings are suggestive for chronic changes.  IMPRESSION: No acute intracranial abnormality.  CT CERVICAL SPINE  Findings: Lung apices are clear.  Multilevel cervical spondylosis. No acute bony abnormality.  The thyroid tissue is heterogeneous.  1 cm nodule in the left thyroid lobe.  Normal alignment of the cervical spine.  IMPRESSION: Multilevel cervical spondylosis.  No acute bony abnormality.   Original Report Authenticated By: Richarda Overlie, M.D.   Dg Chest Port 1 View  05/14/2013   *RADIOLOGY REPORT*  Clinical Data: Fever, urinary incontinence, fall  PORTABLE CHEST - 1 VIEW  Comparison: 10/30/2012   Findings: Normal heart size.  Atherosclerotic calcification of the aortic arch.  Normal hilar contours.  Normal pulmonary vascularity. Lung volumes slightly low but the lungs are clear.  Negative for pleural effusion or pneumothorax.  No acute osseous abnormality.  IMPRESSION: No acute cardiopulmonary disease.   Original Report Authenticated By: Britta Mccreedy, M.D.    Review of Systems  Constitutional: Negative for fever, chills, weight loss and malaise/fatigue.  HENT: Negative.   Eyes: Negative.   Respiratory: Negative.   Cardiovascular: Negative.   Gastrointestinal: Positive  for heartburn. Negative for nausea, vomiting, abdominal pain, diarrhea, constipation, blood in stool and melena.  Genitourinary: Negative.   Musculoskeletal: Negative.   Skin: Negative.   Neurological: Negative.  Negative for weakness.  Endo/Heme/Allergies: Negative.   Psychiatric/Behavioral: Negative.    Blood pressure 124/53, pulse 65, temperature 98.2 F (36.8 C), temperature source Oral, resp. rate 18, height 5\' 4"  (1.626 m), weight 73.45 kg (161 lb 14.9 oz), SpO2 98.00%. Physical Exam  Constitutional: She appears well-developed and well-nourished. No distress.  HENT:  Head: Normocephalic and atraumatic.  Eyes: Conjunctivae and EOM are normal. Pupils are equal, round, and reactive to light. No scleral icterus.  Neck: Normal range of motion. Neck supple. No thyromegaly present.  Cardiovascular: Normal rate, regular rhythm and normal heart sounds.   Respiratory: Effort normal and breath sounds normal. No respiratory distress.  GI: Soft. Bowel sounds are normal. She exhibits no distension. There is no tenderness.  Genitourinary:     Lymphadenopathy:    She has no cervical adenopathy.    Assessment/Plan: Perirectal abscess.  Options discussed with patient and family.  She is agreeable to I&D.  Will proceed.  Continue on abx.  Dyer Klug C 05/15/2013, 10:10 PM

## 2013-05-15 NOTE — Progress Notes (Signed)
Nutrition Brief Note  Patient identified on the Malnutrition Screening Tool (MST) Report  Body mass index is 27.78 kg/(m^2). Patient meets criteria for overweight based on current BMI.   Current diet order is CHO Modified Medium, patient is consuming approximately 50% of meals at this time. She reports good appetite and is unable to provide usual body weight.  Labs and medications reviewed.   No nutrition interventions warranted at this time. If nutrition issues arise, please consult RD.   Royann Shivers MS,RD,LDN,CSG Office: [phone] Pager: [phone]

## 2013-05-15 NOTE — Progress Notes (Signed)
ANTIBIOTIC CONSULT NOTE - INITIAL  Pharmacy Consult for Unasyn Indication: perianal abscess  Allergies  Allergen Reactions  . Propoxyphene-Acetaminophen   . Simvastatin Other (See Comments)    headache    Patient Measurements: Height: 5\' 4"  (162.6 cm) Weight: 161 lb 14.9 oz (73.45 kg) IBW/kg (Calculated) : 54.7  Vital Signs: Temp: 98.8 F (37.1 C) (06/18 1003) Temp src: Oral (06/18 1003) BP: 137/42 mmHg (06/18 1003) Pulse Rate: 66 (06/18 1003) Intake/Output from previous day: 06/17 0701 - 06/18 0700 In: 673.3 [I.V.:573.3; IV Piggyback:100] Out: 900 [Urine:900] Intake/Output from this shift: Total I/O In: 480 [P.O.:480] Out: -   Labs:  Recent Labs  05/14/13 1832 05/15/13 0230 05/15/13 0247  WBC 8.9 9.1  --   HGB 9.8* 9.1*  --   PLT 159 156  --   CREATININE 3.11*  --  2.79*   Estimated Creatinine Clearance: 16.3 ml/min (by C-G formula based on Cr of 2.79). No results found for this basename: VANCOTROUGH, VANCOPEAK, VANCORANDOM, GENTTROUGH, GENTPEAK, GENTRANDOM, TOBRATROUGH, TOBRAPEAK, TOBRARND, AMIKACINPEAK, AMIKACINTROU, AMIKACIN,  in the last 72 hours   Microbiology: No results found for this or any previous visit (from the past 720 hour(s)).  Medical History: Past Medical History  Diagnosis Date  . CAD (coronary artery disease) 2003    Last catheterization 2008. Two-vessel PCI of the first OM branch and mid LAD.  Marland Kitchen CVA (cerebral vascular accident) 1996    LEFT SIDE   . IDDM (insulin dependent diabetes mellitus)   . CKD (chronic kidney disease)   . Myocardial infarction   . Congestive heart failure   . Hypercholesteremia   . Hypertension   . Dementia     Medications:  Scheduled:  . acidophilus  2 capsule Oral Daily  . amLODipine  10 mg Oral Daily  . ampicillin-sulbactam (UNASYN) IV  1.5 g Intravenous Q12H  . atorvastatin  40 mg Oral QHS  . cloNIDine  0.2 mg Transdermal Weekly  . docusate sodium  100 mg Oral BID  . fenofibrate  54 mg Oral Daily   . [START ON 05/16/2013] furosemide  40 mg Oral Daily  . insulin aspart  0-5 Units Subcutaneous QHS  . insulin aspart  0-9 Units Subcutaneous TID WC  . isosorbide mononitrate  120 mg Oral Daily  . labetalol  200 mg Oral BID  . omega-3 acid ethyl esters  1 g Oral BID   Assessment: 79 yo F admitted s/p fall & noted to have perianal abscess on admission.  She is asymptomatic.  Surgical consult pending.  She has chronic renal insufficiency.  Renal function is at patient's baseline.  She is currently on Clindamycin which is causing diarrhea and concern for Cdiff therefore changing therapy to Unasyn.  Goal of Therapy:  Eradicate infection.  Plan:  1) Unasyn 1.5gm IV Q12h 2) Monitor renal function and cx data   Elson Clan 05/15/2013,11:21 AM

## 2013-05-15 NOTE — Progress Notes (Signed)
Subjective: The patient is laying in bed. She has no complaints of headache, dizziness, or buttock pain. She says that she has chronic pain in her knees, particularly the left knee and believes it gave away causing her fall.  Objective: Vital signs in last 24 hours: Filed Vitals:   05/15/13 0643 05/15/13 0646 05/15/13 0649 05/15/13 1003  BP: 132/47 139/43 141/46 137/42  Pulse: 65 66 79 66  Temp: 98.7 F (37.1 C)   98.8 F (37.1 C)  TempSrc: Oral   Oral  Resp: 18   20  Height:      Weight:      SpO2: 98%   100%    Intake/Output Summary (Last 24 hours) at 05/15/13 1047 Last data filed at 05/15/13 0841  Gross per 24 hour  Intake 1153.33 ml  Output    900 ml  Net 253.33 ml    Weight change:   Physical exam: General: Alert 79 year old overweight Caucasian woman laying in bed, in no acute distress. Lungs: Clear anteriorly with decreased breath sounds in the bases. Heart: S1, S2, with a 2-3/6 systolic murmur. Abdomen: Mildly obese, positive bowel sounds, soft, nontender, nondistended. Buttocks/sacrum/perirectal area: Approximate 4 cm indurated area of the right inner buttock/perineal area with scant erythema, no drainage, and very mild tenderness. Incontinent of liquidy green stool. Extremities: No pedal edema. Arthritic hypertrophic changes of her left knee, but no warmth or palpation of fluid. Mild crepitus with left knee extension and flexion. Overall, no acute hot red joints. Neurologic: She is alert and oriented to herself, hospital, and surroundings. Her speech is clear. Cranial nerves II through XII are grossly intact. She is able to raise each of her legs against gravity approximately 30.  Lab Results: Basic Metabolic Panel:  Recent Labs  65/78/46 1832 05/14/13 2309 05/15/13 0247  NA 133*  --  132*  K 4.0  --  3.8  CL 96  --  98  CO2 24  --  21  GLUCOSE 164*  --  145*  BUN 46*  --  44*  CREATININE 3.11*  --  2.79*  CALCIUM 10.1  --  9.5  MG  --  1.9  --     Liver Function Tests:  Recent Labs  05/14/13 1832 05/14/13 2309  AST 14 17  ALT 11 9  ALKPHOS 38* 32*  BILITOT 0.7 0.7  PROT 7.6 6.9  ALBUMIN 4.1 3.5   No results found for this basename: LIPASE, AMYLASE,  in the last 72 hours No results found for this basename: AMMONIA,  in the last 72 hours CBC:  Recent Labs  05/14/13 1832 05/15/13 0230  WBC 8.9 9.1  NEUTROABS 7.0  --   HGB 9.8* 9.1*  HCT 29.4* 27.2*  MCV 89.4 90.7  PLT 159 156   Cardiac Enzymes:  Recent Labs  05/14/13 1832 05/14/13 2309  CKTOTAL  --  428*  TROPONINI <0.30  --    BNP:  Recent Labs  05/14/13 1832  PROBNP 4841.0*   D-Dimer: No results found for this basename: DDIMER,  in the last 72 hours CBG:  Recent Labs  05/14/13 2336  GLUCAP 137*   Hemoglobin A1C: No results found for this basename: HGBA1C,  in the last 72 hours Fasting Lipid Panel: No results found for this basename: CHOL, HDL, LDLCALC, TRIG, CHOLHDL, LDLDIRECT,  in the last 72 hours Thyroid Function Tests: No results found for this basename: TSH, T4TOTAL, FREET4, T3FREE, THYROIDAB,  in the last 72 hours Anemia Panel: No  results found for this basename: VITAMINB12, FOLATE, FERRITIN, TIBC, IRON, RETICCTPCT,  in the last 72 hours Coagulation:  Recent Labs  05/14/13 1832  LABPROT 15.0  INR 1.20   Urine Drug Screen: Drugs of Abuse  No results found for this basename: labopia,  cocainscrnur,  labbenz,  amphetmu,  thcu,  labbarb    Alcohol Level: No results found for this basename: ETH,  in the last 72 hours Urinalysis:  Recent Labs  05/14/13 2038  COLORURINE YELLOW  LABSPEC 1.020  PHURINE 6.0  GLUCOSEU NEGATIVE  HGBUR LARGE*  BILIRUBINUR NEGATIVE  KETONESUR NEGATIVE  PROTEINUR 100*  UROBILINOGEN 0.2  NITRITE NEGATIVE  LEUKOCYTESUR NEGATIVE   Misc. Labs:   Micro: No results found for this or any previous visit (from the past 240 hour(s)).  Studies/Results: Ct Abdomen Pelvis Wo Contrast  05/14/2013    *RADIOLOGY REPORT*  Clinical Data: Periumbilical abscess  CT ABDOMEN AND PELVIS WITHOUT CONTRAST  Technique:  Multidetector CT imaging of the abdomen and pelvis was performed following the standard protocol without intravenous contrast.  Comparison: March 21, 2008.  Findings: Severe degenerative disc disease is noted at T12-L1. Severe degenerative disc disease is noted at L3-4 with grade 1 anterolisthesis secondary to posterior facet joint hypertrophy. Visualized lung bases appear normal.  No focal abnormality seen involving the liver, spleen or pancreas. No gallstones are noted. Adrenal glands appear normal.  No renal or ureteral calculi are noted.  Left-sided peripelvic cysts are again noted.  No definite hydronephrosis or renal obstruction is noted.  Status post appendectomy.  Urinary bladder appears normal.  Probable exophytic fibroid is seen arising from uterine fundus which is unchanged compared to prior exam.  Atherosclerotic calcifications of abdominal aorta are noted.  IMPRESSION: Stable left parapelvic cysts compared to prior exam.  Probable exophytic uterine fibroid which is unchanged compared to prior exam.  No hydronephrosis or renal obstruction is noted. Degenerative disc disease of T12-L1 and L3-4 is noted.   Original Report Authenticated By: Lupita Raider.,  M.D.   Ct Head Wo Contrast  05/14/2013   *RADIOLOGY REPORT*  Clinical Data:  Dementia and found on floor.  CT HEAD WITHOUT CONTRAST CT CERVICAL SPINE WITHOUT CONTRAST  Technique:  Multidetector CT imaging of the head and cervical spine was performed following the standard protocol without intravenous contrast.  Multiplanar CT image reconstructions of the cervical spine were also generated.  Comparison:  01/25/2013  CT HEAD  Findings: No evidence for acute hemorrhage, mass lesion, midline shift, hydrocephalus or large infarct.  The visualized paranasal sinuses are clear.  No acute bony abnormality. There is subtle low density in the  periventricular and subcortical white matter. Findings are suggestive for chronic changes.  IMPRESSION: No acute intracranial abnormality.  CT CERVICAL SPINE  Findings: Lung apices are clear.  Multilevel cervical spondylosis. No acute bony abnormality.  The thyroid tissue is heterogeneous.  1 cm nodule in the left thyroid lobe.  Normal alignment of the cervical spine.  IMPRESSION: Multilevel cervical spondylosis.  No acute bony abnormality.   Original Report Authenticated By: Richarda Overlie, M.D.   Ct Cervical Spine Wo Contrast  05/14/2013   *RADIOLOGY REPORT*  Clinical Data:  Dementia and found on floor.  CT HEAD WITHOUT CONTRAST CT CERVICAL SPINE WITHOUT CONTRAST  Technique:  Multidetector CT imaging of the head and cervical spine was performed following the standard protocol without intravenous contrast.  Multiplanar CT image reconstructions of the cervical spine were also generated.  Comparison:  01/25/2013  CT HEAD  Findings: No evidence for acute hemorrhage, mass lesion, midline shift, hydrocephalus or large infarct.  The visualized paranasal sinuses are clear.  No acute bony abnormality. There is subtle low density in the periventricular and subcortical white matter. Findings are suggestive for chronic changes.  IMPRESSION: No acute intracranial abnormality.  CT CERVICAL SPINE  Findings: Lung apices are clear.  Multilevel cervical spondylosis. No acute bony abnormality.  The thyroid tissue is heterogeneous.  1 cm nodule in the left thyroid lobe.  Normal alignment of the cervical spine.  IMPRESSION: Multilevel cervical spondylosis.  No acute bony abnormality.   Original Report Authenticated By: Richarda Overlie, M.D.   Dg Chest Port 1 View  05/14/2013   *RADIOLOGY REPORT*  Clinical Data: Fever, urinary incontinence, fall  PORTABLE CHEST - 1 VIEW  Comparison: 10/30/2012  Findings: Normal heart size.  Atherosclerotic calcification of the aortic arch.  Normal hilar contours.  Normal pulmonary vascularity. Lung volumes  slightly low but the lungs are clear.  Negative for pleural effusion or pneumothorax.  No acute osseous abnormality.  IMPRESSION: No acute cardiopulmonary disease.   Original Report Authenticated By: Britta Mccreedy, M.D.    Medications:  Scheduled: . acidophilus  2 capsule Oral Daily  . amLODipine  10 mg Oral Daily  . atorvastatin  40 mg Oral QHS  . clindamycin (CLEOCIN) IV  600 mg Intravenous Q8H  . cloNIDine  0.2 mg Transdermal Weekly  . docusate sodium  100 mg Oral BID  . fenofibrate  54 mg Oral Daily  . insulin aspart  0-5 Units Subcutaneous QHS  . insulin aspart  0-9 Units Subcutaneous TID WC  . isosorbide mononitrate  120 mg Oral Daily  . labetalol  200 mg Oral BID  . omega-3 acid ethyl esters  1 g Oral BID   Continuous: . sodium chloride 100 mL/hr at 05/14/13 2355   WUJ:[id number], haloperidol lactate, hydrALAZINE, meclizine, nitroGLYCERIN, ondansetron (ZOFRAN) IV, sorbitol  Assessment: Principal Problem:   Fall Active Problems:   Perianal abscess   Heat exhaustion   DM (diabetes mellitus)   CKD (chronic kidney disease) stage 3, GFR 30-59 ml/min   Hypertension   OA (osteoarthritis)   Chronic anemia   Hyponatremia   Overweight   Acute renal insufficiency   1. Fall. Etiology unclear, but the patient denies loss of consciousness and says that her left knee gave away. She appears to be neurologically intact. No obvious evidence of an ischemic event. Her CK is in the 400s, not consistent with rhabdomyolysis. CT scans negative for acute fractures.  Fever. This is presumably secondary to the perianal abscess. Her urinalysis is unremarkable. Her chest x-ray is unremarkable.  Perianal abscess. Surprisingly, the patient is asymptomatic. Will await general surgery consultation. Clindamycin has been started, but given her liquidy stool, I would favor Unasyn or Zosyn instead to decrease her chance of acquiring C. difficile.  Acute renal insufficiency superimposed on stage III  chronic kidney disease. The patient's baseline creatinine is approximately 2.3-2.4. We'll continue gentle hydration and holding the Lasix. We'll watch her pulmonary status closely given her history of chronic diastolic heart failure. Next  History of coronary artery disease/chronic diastolic heart failure/hypertension. Currently stable. Continue Imdur, Catapres patch, omega-3 fatty acids, Lipitor, amlodipine, fenofibrate, and labetalol. Hold Lasix today.  Hyponatremia. Her serum sodium is slightly below normal limits and has not improved with hydration overnight. We'll continue to monitor and adjust IV fluids accordingly.  Type 2 diabetes mellitus. Reasonable control. We'll continue sliding scale NovoLog and adjust accordingly.  Normocytic anemia. She has a history of mild iron deficiency per iron studies in December 2013.Marland Kitchen Last year, her hemoglobin was in the 8.7 range. Obviously, her hemoglobin on admission was better, but still low. She reports refusal of colonoscopy in the past and still does.    Plan:  1. Discontinue clindamycin in favor of Unasyn. 2. Await general surgery consultation. 3. Decrease IV fluids a little. We'll possibly restart Lasix tomorrow. 4. Since stool to assess for C. difficile PCR. 5. Check TSH vitamin B12 and pending. We'll order hemoglobin A1c. 6. Physical therapy consultation.    LOS: 1 day   Shreshta Medley 05/15/2013, 10:47 AM

## 2013-05-15 NOTE — Procedures (Signed)
Incision and Drainage Procedure Note  Pre-operative Diagnosis: Perirectal abscess  Post-operative Diagnosis: same  Indications: Patient presented with fluctuants and erythema along right perirectal area.  Anesthesia: 1% plain lidocaine  Procedure Details  The procedure, risks and complications have been discussed in detail (including, but not limited to airway compromise, infection, bleeding) with the patient, and the patient has signed consent to the procedure.  The skin was sterilely prepped and draped over the affected area in the usual fashion. After adequate local anesthesia, I&D with a #11 blade was performed on the right perirectal area.. Purulent drainage: present The patient was observed until stable.  Findings: Large amount of purulent discharge.  Foul odor.    EBL: minimal cc's  Drains: none, wound was packed with iodoform  Condition: Tolerated procedure well   Complications: none.

## 2013-05-15 NOTE — Clinical Social Work Psychosocial (Signed)
Clinical Social Work Department BRIEF PSYCHOSOCIAL ASSESSMENT 05/15/2013  Patient:  [name], [name]     Account Number:  [id number]     Admit date:  05/14/2013  Clinical Social Worker:  Nancie Neas  Date/Time:  05/15/2013 02:40 PM  Referred by:  Physician  Date Referred:  05/15/2013 Referred for  SNF Placement   Other Referral:   Interview type:  Patient Other interview type:   and daughter- [name]    PSYCHOSOCIAL DATA Living Status:  ALONE Admitted from facility:   Level of care:   Primary support name:  [name] Primary support relationship to patient:  CHILD, ADULT Degree of support available:   supportive    CURRENT CONCERNS Current Concerns  Post-Acute Placement   Other Concerns:    SOCIAL WORK ASSESSMENT / PLAN CSW met with pt and pt's daughter [name] at bedside. Pt alert and oriented per report, but quickly fell asleep when CSW started assessment. She did give permission for CSW to talk with [name]. [name] reports pt lives alone but has 6 children who all live nearby. [name] was living with pt until a month ago when she got her own apartment because she could not take the temperature that pt keeps her house at. She states even in 90 degree heat, pt will have her heat on inside and have a sweater on. [name] had been trying to call her for 2 hours and was getting a busy signal. She went to pt's house to check on her and pt was on floor after a fall. They called EMS. Pt is independent at baseline and has no problems with driving, cooking, and cleaning. She helps take care of her son's home when he is out of town as well. [name] reports they did not know pt had a diagnosis of dementia. She recalls some forgetfulness by pt, but nothing too concerning. Pt would have difficulty taking her medications as prescribed. [name] had pharmacy create pre-packaged mediations for her and she has been doing well with this. PT assessed pt today, but pt refused to get up. [name] indicates if  pt is not up doing something she is sleeping. She does not feel pt would be agreeable to any type of placement. Her 77 year old niece had been staying with pt some to help out, but was not at pt's home when she fell. [name] is agreeable to CSW follow up after PT can complete full evaluation tomorrow.   Assessment/plan status:  Psychosocial Support/Ongoing Assessment of Needs Other assessment/ plan:   Information/referral to community resources:   none at this time, will follow for PT recommendations    PATIENT'S/FAMILY'S RESPONSE TO PLAN OF CARE: Pt unable to discuss plan of care. Pt's daughter indicates pt usually manages just fine at home, but keeps the home too warm for her to stay with her. CSW will follow up tomorrow to discuss d/c plan with pt.       Derenda Fennel, [place] [phone]

## 2013-05-16 ENCOUNTER — Encounter (HOSPITAL_COMMUNITY): Payer: Self-pay | Admitting: Internal Medicine

## 2013-05-16 DIAGNOSIS — N179 Acute kidney failure, unspecified: Secondary | ICD-10-CM

## 2013-05-16 DIAGNOSIS — M47816 Spondylosis without myelopathy or radiculopathy, lumbar region: Secondary | ICD-10-CM | POA: Diagnosis present

## 2013-05-16 LAB — GLUCOSE, CAPILLARY
Glucose-Capillary: 144 mg/dL — ABNORMAL HIGH (ref 70–99)
Glucose-Capillary: 199 mg/dL — ABNORMAL HIGH (ref 70–99)

## 2013-05-16 LAB — CBC
MCV: 88.8 fL (ref 78.0–100.0)
Platelets: 148 10*3/uL — ABNORMAL LOW (ref 150–400)
RBC: 2.59 MIL/uL — ABNORMAL LOW (ref 3.87–5.11)
WBC: 5.1 10*3/uL (ref 4.0–10.5)

## 2013-05-16 LAB — BASIC METABOLIC PANEL
CO2: 22 mEq/L (ref 19–32)
Chloride: 101 mEq/L (ref 96–112)
Creatinine, Ser: 2.79 mg/dL — ABNORMAL HIGH (ref 0.50–1.10)
Sodium: 133 mEq/L — ABNORMAL LOW (ref 135–145)

## 2013-05-16 MED ORDER — PIPERACILLIN-TAZOBACTAM IN DEX 2-0.25 GM/50ML IV SOLN
2.2500 g | Freq: Three times a day (TID) | INTRAVENOUS | Status: DC
  Administered 2013-05-16 – 2013-05-17 (×4): 2.25 g via INTRAVENOUS
  Filled 2013-05-16 (×13): qty 50

## 2013-05-16 MED ORDER — INSULIN NPH (HUMAN) (ISOPHANE) 100 UNIT/ML ~~LOC~~ SUSP
5.0000 [IU] | Freq: Two times a day (BID) | SUBCUTANEOUS | Status: DC
  Administered 2013-05-17: 5 [IU] via SUBCUTANEOUS
  Filled 2013-05-16: qty 10

## 2013-05-16 MED ORDER — LABETALOL HCL 200 MG PO TABS
100.0000 mg | ORAL_TABLET | Freq: Two times a day (BID) | ORAL | Status: DC
  Administered 2013-05-17 – 2013-05-18 (×3): 100 mg via ORAL
  Filled 2013-05-16 (×3): qty 1

## 2013-05-16 MED ORDER — VANCOMYCIN HCL IN DEXTROSE 1-5 GM/200ML-% IV SOLN
1000.0000 mg | INTRAVENOUS | Status: DC
  Administered 2013-05-16: 1000 mg via INTRAVENOUS
  Filled 2013-05-16 (×3): qty 200

## 2013-05-16 NOTE — Progress Notes (Signed)
Physical Therapy Treatment Patient Details Name: [name] MRN: [id number] DOB: [birth year] Today's Date: 05/16/2013 Time: 4098-1191 PT Time Calculation (min): 30 min  PT Assessment / Plan / Recommendation Comments on Treatment Session  Pt alert and more cooperative.  She was able to transfer OOB with no assistance but was unsteady upon standing.  She needed a walker for gait stability.  She reports that she has only 2 steps at back entrance of home, so this will be the entrance she will use.  Will plan to practice steps tomorrow.    Follow Up Recommendations  Home health PT     Does the patient have the potential to tolerate intense rehabilitation     Barriers to Discharge        Equipment Recommendations  Rolling walker with 5" wheels    Recommendations for Other Services    Frequency     Plan      Precautions / Restrictions     Pertinent Vitals/Pain     Mobility  Bed Mobility Bed Mobility: Supine to Sit;Sit to Supine Supine to Sit: 6: Modified independent (Device/Increase time);HOB flat Sit to Supine: 6: Modified independent (Device/Increase time);HOB flat Transfers Transfers: Sit to Stand;Stand to Sit Sit to Stand: 5: Supervision;From bed Stand to Sit: To bed;5: Supervision Details for Transfer Assistance: Unsteady upon standing...reaches for a support Ambulation/Gait Ambulation/Gait Assistance: 5: Supervision Ambulation Distance (Feet): 100 Feet Assistive device: Rolling walker Ambulation/Gait Assistance Details: Pt needs walker for stablity Gait Pattern: Trunk flexed;Within Functional Limits Gait velocity: WNL Stairs: No Wheelchair Mobility Wheelchair Mobility: No    Exercises General Exercises - Lower Extremity Ankle Circles/Pumps: AROM;Both;10 reps;Supine Long Arc Quad: AROM;Both;10 reps;Seated Heel Slides: AROM;Both;10 reps;Supine   PT Diagnosis: Difficulty walking;Generalized weakness  PT Problem List:   PT Treatment Interventions:     PT  Goals Acute Rehab PT Goals PT Goal: Supine/Side to Sit - Progress: Progressing toward goal PT Goal: Sit to Supine/Side - Progress: Progressing toward goal PT Goal: Sit to Stand - Progress: Progressing toward goal PT Goal: Stand to Sit - Progress: Progressing toward goal Pt will Ambulate: 51 - 150 feet PT Goal: Ambulate - Progress: Updated due to goal met  Visit Information  Last PT Received On: 05/16/13    Subjective Data  Subjective: I feel better today   Cognition       Balance     End of Session PT - End of Session Equipment Utilized During Treatment: Gait belt Activity Tolerance: Patient tolerated treatment well Patient left: in bed;with call bell/phone within reach;with family/visitor present Nurse Communication: Mobility status   GP     Konrad Penta 05/16/2013, 2:25 PM

## 2013-05-16 NOTE — Clinical Social Work Note (Signed)
CSW followed up with pt after PT. Pt states she is going home at d/c and nobody can make her go anywhere else. Pt feels she can manage at home with home health. Family was also willing to assist yesterday. CM notified and aware of equipment/home health needs. CSW signing off but can be reconsulted if needed.   Derenda Fennel, [place] [phone]

## 2013-05-16 NOTE — Progress Notes (Signed)
ANTIBIOTIC CONSULT NOTE  Pharmacy Consult for Vancomycin and Zosyn Indication: perianal abscess  Allergies  Allergen Reactions  . Propoxyphene-Acetaminophen   . Simvastatin Other (See Comments)    headache   Patient Measurements: Height: 5\' 4"  (162.6 cm) Weight: 173 lb 11.2 oz (78.79 kg) (1 blanket) IBW/kg (Calculated) : 54.7  Vital Signs: Temp: 99.1 F (37.3 C) (06/19 0411) Temp src: Oral (06/19 0411) BP: 136/44 mmHg (06/19 0414) Pulse Rate: 66 (06/19 0411) Intake/Output from previous day: 06/18 0701 - 06/19 0700 In: 930 [P.O.:930] Out: 925 [Urine:925] Intake/Output from this shift:   Labs:  Recent Labs  05/14/13 1832 05/15/13 0230 05/15/13 0247 05/16/13 0539  WBC 8.9 9.1  --  5.1  HGB 9.8* 9.1*  --  7.9*  PLT 159 156  --  148*  CREATININE 3.11*  --  2.79* 2.79*   Estimated Creatinine Clearance: 16.9 ml/min (by C-G formula based on Cr of 2.79).  No results found for this basename: VANCOTROUGH, Leodis Binet, VANCORANDOM, GENTTROUGH, GENTPEAK, GENTRANDOM, TOBRATROUGH, TOBRAPEAK, TOBRARND, AMIKACINPEAK, AMIKACINTROU, AMIKACIN,  in the last 72 hours   Microbiology: Recent Results (from the past 720 hour(s))  CLOSTRIDIUM DIFFICILE BY PCR     Status: None   Collection Time    05/15/13 10:35 AM      Result Value Range Status   C difficile by pcr NEGATIVE  NEGATIVE Final  CULTURE, ROUTINE-ABSCESS     Status: None   Collection Time    05/15/13 12:40 PM      Result Value Range Status   Specimen Description ABSCESS PERIRECTAL   Final   Special Requests NONE   Final   Gram Stain     Final   Value: RARE WBC PRESENT, PREDOMINANTLY PMN     NO SQUAMOUS EPITHELIAL CELLS SEEN     FEW GRAM POSITIVE COCCI     IN PAIRS FEW GRAM NEGATIVE RODS   Culture Culture reincubated for better growth   Final   Report Status PENDING   Incomplete   Medical History: Past Medical History  Diagnosis Date  . CAD (coronary artery disease) 2003    Last catheterization 2008. Two-vessel PCI  of the first OM branch and mid LAD.  Marland Kitchen CVA (cerebral vascular accident) 1996    LEFT SIDE   . IDDM (insulin dependent diabetes mellitus)   . CKD (chronic kidney disease)   . Myocardial infarction   . Congestive heart failure   . Hypercholesteremia   . Hypertension   . Dementia   . DJD (degenerative joint disease) of lumbar spine     Medications:  Scheduled:  . acidophilus  2 capsule Oral Daily  . amLODipine  10 mg Oral Daily  . atorvastatin  40 mg Oral QHS  . cloNIDine  0.2 mg Transdermal Weekly  . docusate sodium  100 mg Oral BID  . fenofibrate  54 mg Oral Daily  . furosemide  40 mg Oral Daily  . insulin aspart  0-5 Units Subcutaneous QHS  . insulin aspart  0-9 Units Subcutaneous TID WC  . isosorbide mononitrate  120 mg Oral Daily  . labetalol  200 mg Oral BID  . omega-3 acid ethyl esters  1 g Oral BID  . piperacillin-tazobactam (ZOSYN)  IV  2.25 g Intravenous Q8H  . vancomycin  1,000 mg Intravenous Q48H   Assessment: 79 yo F admitted s/p fall & noted to have perianal abscess on admission.  She is asymptomatic.  Surgical I&D completed 6/18. She has chronic renal insufficiency.  Renal  function is stable.  Estimated Creatinine Clearance: 16.9 ml/min (by C-G formula based on Cr of 2.79).  Unasyn to be switched to Vancomycin and Zosyn.  Goal of Therapy:  Eradicate infection. Vancomycin trough 15-20.  Plan:  Zosyn 2.25gm IV every 8 hours. Vancomycin 1000mg  IV every 48 hours. Follow-up micro data, labs, vitals.  Mady Gemma 05/16/2013,8:12 AM

## 2013-05-16 NOTE — Progress Notes (Signed)
Subjective: The patient sitting up on the side of the bed, working with the physical therapist. She has no complaints of chest pain, shortness of breath, or perirectal/pain.  Objective: Vital signs in last 24 hours: Filed Vitals:   05/16/13 0411 05/16/13 0414 05/16/13 0957 05/16/13 1428  BP: 124/34 136/44 110/62 105/58  Pulse: 66  57 59  Temp: 99.1 F (37.3 C)  98.2 F (36.8 C) 98.3 F (36.8 C)  TempSrc: Oral  Oral Oral  Resp: 18  16 18   Height:      Weight: 78.79 kg (173 lb 11.2 oz)     SpO2: 99%  97% 96%    Intake/Output Summary (Last 24 hours) at 05/16/13 1544 Last data filed at 05/16/13 1431  Gross per 24 hour  Intake    390 ml  Output    725 ml  Net   -335 ml    Weight change: 5.307 kg (11 lb 11.2 oz)  Physical exam: General: Alert 79 year old overweight Caucasian woman in no acute distress. Lungs: Clear anteriorly with decreased breath sounds in the bases. Heart: S1, S2, with a 2-3/6 systolic murmur. Abdomen: Mildly obese, positive bowel sounds, soft, nontender, nondistended. Buttocks/sacrum/perirectal area: Not examined today. Extremities: No pedal edema. Arthritic hypertrophic changes of her left knee, but no warmth or palpation of fluid. Mild crepitus with left knee extension and flexion. Overall, no acute hot red joints. Neurologic: She is alert and oriented to herself, hospital, and surroundings. Her speech is clear. Cranial nerves II through XII are grossly intact.  Lab Results: Basic Metabolic Panel:  Recent Labs  56/21/30 2309 05/15/13 0247 05/16/13 0539  NA  --  132* 133*  K  --  3.8 3.9  CL  --  98 101  CO2  --  21 22  GLUCOSE  --  145* 157*  BUN  --  44* 47*  CREATININE  --  2.79* 2.79*  CALCIUM  --  9.5 9.1  MG 1.9  --   --    Liver Function Tests:  Recent Labs  05/14/13 1832 05/14/13 2309  AST 14 17  ALT 11 9  ALKPHOS 38* 32*  BILITOT 0.7 0.7  PROT 7.6 6.9  ALBUMIN 4.1 3.5   No results found for this basename: LIPASE, AMYLASE,   in the last 72 hours No results found for this basename: AMMONIA,  in the last 72 hours CBC:  Recent Labs  05/14/13 1832 05/15/13 0230 05/16/13 0539  WBC 8.9 9.1 5.1  NEUTROABS 7.0  --   --   HGB 9.8* 9.1* 7.9*  HCT 29.4* 27.2* 23.0*  MCV 89.4 90.7 88.8  PLT 159 156 148*   Cardiac Enzymes:  Recent Labs  05/14/13 1832 05/14/13 2309  CKTOTAL  --  428*  TROPONINI <0.30  --    BNP:  Recent Labs  05/14/13 1832  PROBNP 4841.0*   D-Dimer: No results found for this basename: DDIMER,  in the last 72 hours CBG:  Recent Labs  05/15/13 0739 05/15/13 1158 05/15/13 1632 05/15/13 2045 05/16/13 0741 05/16/13 1132  GLUCAP 155* 198* 161* 228* 144* 199*   Hemoglobin A1C:  Recent Labs  05/14/13 2309  HGBA1C 6.9*   Fasting Lipid Panel: No results found for this basename: CHOL, HDL, LDLCALC, TRIG, CHOLHDL, LDLDIRECT,  in the last 72 hours Thyroid Function Tests:  Recent Labs  05/14/13 2309  TSH 1.847   Anemia Panel:  Recent Labs  05/15/13 0639  VITAMINB12 244   Coagulation:  Recent Labs  05/14/13 1832  LABPROT 15.0  INR 1.20   Urine Drug Screen: Drugs of Abuse  No results found for this basename: labopia,  cocainscrnur,  labbenz,  amphetmu,  thcu,  labbarb    Alcohol Level: No results found for this basename: ETH,  in the last 72 hours Urinalysis:  Recent Labs  05/14/13 2038  COLORURINE YELLOW  LABSPEC 1.020  PHURINE 6.0  GLUCOSEU NEGATIVE  HGBUR LARGE*  BILIRUBINUR NEGATIVE  KETONESUR NEGATIVE  PROTEINUR 100*  UROBILINOGEN 0.2  NITRITE NEGATIVE  LEUKOCYTESUR NEGATIVE   Misc. Labs:   Micro: Recent Results (from the past 240 hour(s))  CLOSTRIDIUM DIFFICILE BY PCR     Status: None   Collection Time    05/15/13 10:35 AM      Result Value Range Status   C difficile by pcr NEGATIVE  NEGATIVE Final  CULTURE, ROUTINE-ABSCESS     Status: None   Collection Time    05/15/13 12:40 PM      Result Value Range Status   Specimen Description  ABSCESS PERIRECTAL   Final   Special Requests NONE   Final   Gram Stain     Final   Value: RARE WBC PRESENT, PREDOMINANTLY PMN     NO SQUAMOUS EPITHELIAL CELLS SEEN     FEW GRAM POSITIVE COCCI     IN PAIRS FEW GRAM NEGATIVE RODS   Culture Culture reincubated for better growth   Final   Report Status PENDING   Incomplete    Studies/Results: Ct Abdomen Pelvis Wo Contrast  05/14/2013   *RADIOLOGY REPORT*  Clinical Data: Periumbilical abscess  CT ABDOMEN AND PELVIS WITHOUT CONTRAST  Technique:  Multidetector CT imaging of the abdomen and pelvis was performed following the standard protocol without intravenous contrast.  Comparison: March 21, 2008.  Findings: Severe degenerative disc disease is noted at T12-L1. Severe degenerative disc disease is noted at L3-4 with grade 1 anterolisthesis secondary to posterior facet joint hypertrophy. Visualized lung bases appear normal.  No focal abnormality seen involving the liver, spleen or pancreas. No gallstones are noted. Adrenal glands appear normal.  No renal or ureteral calculi are noted.  Left-sided peripelvic cysts are again noted.  No definite hydronephrosis or renal obstruction is noted.  Status post appendectomy.  Urinary bladder appears normal.  Probable exophytic fibroid is seen arising from uterine fundus which is unchanged compared to prior exam.  Atherosclerotic calcifications of abdominal aorta are noted.  IMPRESSION: Stable left parapelvic cysts compared to prior exam.  Probable exophytic uterine fibroid which is unchanged compared to prior exam.  No hydronephrosis or renal obstruction is noted. Degenerative disc disease of T12-L1 and L3-4 is noted.   Original Report Authenticated By: Lupita Raider.,  M.D.   Ct Head Wo Contrast  05/14/2013   *RADIOLOGY REPORT*  Clinical Data:  Dementia and found on floor.  CT HEAD WITHOUT CONTRAST CT CERVICAL SPINE WITHOUT CONTRAST  Technique:  Multidetector CT imaging of the head and cervical spine was performed  following the standard protocol without intravenous contrast.  Multiplanar CT image reconstructions of the cervical spine were also generated.  Comparison:  01/25/2013  CT HEAD  Findings: No evidence for acute hemorrhage, mass lesion, midline shift, hydrocephalus or large infarct.  The visualized paranasal sinuses are clear.  No acute bony abnormality. There is subtle low density in the periventricular and subcortical white matter. Findings are suggestive for chronic changes.  IMPRESSION: No acute intracranial abnormality.  CT CERVICAL SPINE  Findings: Lung apices are clear.  Multilevel cervical spondylosis. No acute bony abnormality.  The thyroid tissue is heterogeneous.  1 cm nodule in the left thyroid lobe.  Normal alignment of the cervical spine.  IMPRESSION: Multilevel cervical spondylosis.  No acute bony abnormality.   Original Report Authenticated By: Richarda Overlie, M.D.   Ct Cervical Spine Wo Contrast  05/14/2013   *RADIOLOGY REPORT*  Clinical Data:  Dementia and found on floor.  CT HEAD WITHOUT CONTRAST CT CERVICAL SPINE WITHOUT CONTRAST  Technique:  Multidetector CT imaging of the head and cervical spine was performed following the standard protocol without intravenous contrast.  Multiplanar CT image reconstructions of the cervical spine were also generated.  Comparison:  01/25/2013  CT HEAD  Findings: No evidence for acute hemorrhage, mass lesion, midline shift, hydrocephalus or large infarct.  The visualized paranasal sinuses are clear.  No acute bony abnormality. There is subtle low density in the periventricular and subcortical white matter. Findings are suggestive for chronic changes.  IMPRESSION: No acute intracranial abnormality.  CT CERVICAL SPINE  Findings: Lung apices are clear.  Multilevel cervical spondylosis. No acute bony abnormality.  The thyroid tissue is heterogeneous.  1 cm nodule in the left thyroid lobe.  Normal alignment of the cervical spine.  IMPRESSION: Multilevel cervical  spondylosis.  No acute bony abnormality.   Original Report Authenticated By: Richarda Overlie, M.D.   Dg Chest Port 1 View  05/14/2013   *RADIOLOGY REPORT*  Clinical Data: Fever, urinary incontinence, fall  PORTABLE CHEST - 1 VIEW  Comparison: 10/30/2012  Findings: Normal heart size.  Atherosclerotic calcification of the aortic arch.  Normal hilar contours.  Normal pulmonary vascularity. Lung volumes slightly low but the lungs are clear.  Negative for pleural effusion or pneumothorax.  No acute osseous abnormality.  IMPRESSION: No acute cardiopulmonary disease.   Original Report Authenticated By: Britta Mccreedy, M.D.    Medications:  Scheduled: . acidophilus  2 capsule Oral Daily  . amLODipine  10 mg Oral Daily  . atorvastatin  40 mg Oral QHS  . cloNIDine  0.2 mg Transdermal Weekly  . docusate sodium  100 mg Oral BID  . fenofibrate  54 mg Oral Daily  . furosemide  40 mg Oral Daily  . insulin aspart  0-5 Units Subcutaneous QHS  . insulin aspart  0-9 Units Subcutaneous TID WC  . isosorbide mononitrate  120 mg Oral Daily  . labetalol  200 mg Oral BID  . omega-3 acid ethyl esters  1 g Oral BID  . piperacillin-tazobactam (ZOSYN)  IV  2.25 g Intravenous Q8H  . vancomycin  1,000 mg Intravenous Q48H   Continuous: . sodium chloride 70 mL/hr at 05/16/13 0036   UJW:[id number], haloperidol lactate, hydrALAZINE, meclizine, nitroGLYCERIN, ondansetron (ZOFRAN) IV, sorbitol  Assessment: Principal Problem:   Fall Active Problems:   Perianal abscess   Heat exhaustion   DM (diabetes mellitus)   CKD (chronic kidney disease) stage 3, GFR 30-59 ml/min   Hypertension   OA (osteoarthritis)   Chronic anemia   Hyponatremia   Overweight   Acute renal insufficiency   DJD (degenerative joint disease) of lumbar spine   1. Fall. Etiology unclear, but the patient denies loss of consciousness and says that her left knee gave away. She appears to be neurologically intact. No obvious evidence of an ischemic  event. Her CK is in the 400s, not consistent with rhabdomyolysis. CT scans negative for acute fractures.  Fever. This is presumably secondary to the perianal abscess. Her urinalysis is unremarkable. Her chest x-ray is  unremarkable.  Perianal/perirectal abscess. Postoperative day #1, incision and drainage by general surgeon Dr. Leticia Penna. Clindamycin has been started, but given her liquidy stool, I would favor Unasyn or Zosyn. Given her fever overnight, will broaden antibiotic therapy to Zosyn and vancomycin. C. difficile PCR negative.  Acute renal insufficiency superimposed on stage III chronic kidney disease. The patient's baseline creatinine is approximately 2.3-2.4. She appears to be more hydrated following gentle hydration. We'll decrease the IV fluids as she is getting fluids with her antibiotics. Lasix was restarted this morning.  We'll watch her pulmonary status closely given her history of chronic diastolic heart failure.   History of coronary artery disease/chronic diastolic heart failure/hypertension. Currently stable. Continue Imdur, Catapres patch, omega-3 fatty acids, Lipitor, amlodipine, fenofibrate, and labetalol. Lasix restarted.  Hyponatremia. Her serum sodium is slightly below normal limits. We'll continue to monitor.  Type 2 diabetes mellitus. Reasonable control. We'll continue sliding scale NovoLog and adjust accordingly. Will start small dosing of twice a day NPH (she is on 70/30 insulin at home). Hemoglobin A1c 6.9.  Normocytic anemia. Her hemoglobin has fallen from 9.1 to 7.9 which may be in part dilutional and from perioperative blood loss. Her TSH is within normal limits. Her vitamin B12 level is low-normal. We'll give her a couple vitamin B12 injections. She has a history of mild iron deficiency per iron studies in December 2013.Marland Kitchen Last year, her hemoglobin was in the 8.7 range. She reports refusal of colonoscopy in the past and still does. I do not believe she needs a blood  transfusion at this time. We'll continue to monitor.    Plan:  1. Decrease IV fluids. 2. Postoperative wound changes per Dr. Dian Situ. 3. Possible discharge in the next 24-48 hours. 4. Monitor hemoglobin/hematocrit. 5. Discontinue Foley catheter.     LOS: 2 days   Yasmyn Bellisario 05/16/2013, 3:44 PM

## 2013-05-17 LAB — CBC
HCT: 21.8 % — ABNORMAL LOW (ref 36.0–46.0)
HCT: 25.6 % — ABNORMAL LOW (ref 36.0–46.0)
Hemoglobin: 7.3 g/dL — ABNORMAL LOW (ref 12.0–15.0)
Hemoglobin: 8.7 g/dL — ABNORMAL LOW (ref 12.0–15.0)
MCH: 29.9 pg (ref 26.0–34.0)
MCH: 30 pg (ref 26.0–34.0)
MCHC: 33.5 g/dL (ref 30.0–36.0)
MCHC: 34 g/dL (ref 30.0–36.0)
MCV: 89.3 fL (ref 78.0–100.0)
MCV: 88.3 fL (ref 78.0–100.0)
Platelets: 156 K/uL (ref 150–400)
Platelets: 177 K/uL (ref 150–400)
RBC: 2.44 MIL/uL — ABNORMAL LOW (ref 3.87–5.11)
RBC: 2.9 MIL/uL — ABNORMAL LOW (ref 3.87–5.11)
RDW: 12.4 % (ref 11.5–15.5)
RDW: 13.1 % (ref 11.5–15.5)
WBC: 3.6 K/uL — ABNORMAL LOW (ref 4.0–10.5)
WBC: 3.6 K/uL — ABNORMAL LOW (ref 4.0–10.5)

## 2013-05-17 LAB — GLUCOSE, CAPILLARY
Glucose-Capillary: 245 mg/dL — ABNORMAL HIGH (ref 70–99)
Glucose-Capillary: 242 mg/dL — ABNORMAL HIGH (ref 70–99)
Glucose-Capillary: 251 mg/dL — ABNORMAL HIGH (ref 70–99)

## 2013-05-17 LAB — BASIC METABOLIC PANEL WITH GFR
BUN: 47 mg/dL — ABNORMAL HIGH (ref 6–23)
CO2: 23 meq/L (ref 19–32)
Calcium: 9.5 mg/dL (ref 8.4–10.5)
Chloride: 103 meq/L (ref 96–112)
Creatinine, Ser: 3.14 mg/dL — ABNORMAL HIGH (ref 0.50–1.10)
GFR calc Af Amer: 15 mL/min — ABNORMAL LOW (ref >90)
GFR calc non Af Amer: 13 mL/min — ABNORMAL LOW (ref >90)
Glucose, Bld: 225 mg/dL — ABNORMAL HIGH (ref 70–99)
Potassium: 4.1 meq/L (ref 3.5–5.1)
Sodium: 137 meq/L (ref 135–145)

## 2013-05-17 LAB — PREPARE RBC (CROSSMATCH)

## 2013-05-17 MED ORDER — FUROSEMIDE 10 MG/ML IJ SOLN: 20.0000 mg | Freq: Once | INTRAMUSCULAR | Status: DC

## 2013-05-17 MED ORDER — CYANOCOBALAMIN 1000 MCG/ML IJ SOLN
1000.0000 ug | Freq: Every day | INTRAMUSCULAR | Status: DC
  Administered 2013-05-17 – 2013-05-18 (×2): 1000 ug via INTRAMUSCULAR
  Filled 2013-05-17 (×2): qty 1

## 2013-05-17 MED ORDER — AMOXICILLIN-POT CLAVULANATE 500-125 MG PO TABS
1.0000 | ORAL_TABLET | Freq: Three times a day (TID) | ORAL | Status: DC
  Administered 2013-05-17 – 2013-05-18 (×3): 500 mg via ORAL
  Filled 2013-05-17 (×3): qty 1

## 2013-05-17 MED ORDER — INSULIN NPH (HUMAN) (ISOPHANE) 100 UNIT/ML ~~LOC~~ SUSP
12.0000 [IU] | Freq: Two times a day (BID) | SUBCUTANEOUS | Status: DC
  Administered 2013-05-17 – 2013-05-18 (×2): 12 [IU] via SUBCUTANEOUS

## 2013-05-17 NOTE — Progress Notes (Signed)
Inpatient Diabetes Program Recommendations  AACE/ADA: New Consensus Statement on Inpatient Glycemic Control (2013)  Target Ranges:  Prepandial:   less than 140 mg/dL      Peak postprandial:   less than 180 mg/dL (1-2 hours)      Critically ill patients:  140 - 180 mg/dL     Results for [name], [name] (MRN [id number]) as of 05/17/2013 11:46  Ref. Range 05/17/2013 07:30 05/17/2013 11:26  Glucose-Capillary Latest Range: 70-99 mg/dL 409 (H) 811 (H)    **Per records, patient takes 70/30 insulin at home- 25 units bid with meals  **Noted NPH insulin started today (5 units bid)  **Patient ate 100% of her breakfast this morning  If patient's CBGs continue to stay elevated, recommend the following insulin adjustments:  1. D/C NPH insulin 2. Start 1/2 patient's home dose of 70/30 insulin- 12 units bid with meals   Will follow. Ambrose Finland RN, MSN, CDE Diabetes Coordinator Inpatient Diabetes Program [phone]

## 2013-05-17 NOTE — Progress Notes (Signed)
  Subjective: No complaints,  no perirectal pain. No fevers or chills.  Objective: Vital signs in last 24 hours: Temp:  [97.5 F (36.4 C)-98.3 F (36.8 C)] 97.5 F (36.4 C) (06/20 1215) Pulse Rate:  [59-79] 79 (06/20 1215) Resp:  [18-22] 20 (06/20 1215) BP: (105-151)/(48-75) 130/75 mmHg (06/20 1215) SpO2:  [96 %-100 %] 97 % (06/20 1215) Weight:  [78 kg (171 lb 15.3 oz)] 78 kg (171 lb 15.3 oz) (06/20 0443) Last BM Date: 05/16/13  Intake/Output from previous day: 06/19 0701 - 06/20 0700 In: 3832 [P.O.:750; I.V.:2932; IV Piggyback:150] Out: 350 [Urine:350] Intake/Output this shift: Total I/O In: 632.5 [P.O.:120; I.V.:500; Blood:12.5] Out: -   General appearance: alert, cooperative and no distress GI: Gluteal region: Right-sided incision and drainage site intact. Packing removed. Still with foul-smelling purulent discharge. No erythema has improved. Some induration around the area. No significant pain  Lab Results:   Recent Labs  05/16/13 0539 05/17/13 0456  WBC 5.1 3.6*  HGB 7.9* 7.3*  HCT 23.0* 21.8*  PLT 148* 156   BMET  Recent Labs  05/16/13 0539 05/17/13 0456  NA 133* 137  K 3.9 4.1  CL 101 103  CO2 22 23  GLUCOSE 157* 225*  BUN 47* 47*  CREATININE 2.79* 3.14*  CALCIUM 9.1 9.5   PT/INR  Recent Labs  05/14/13 1832  LABPROT 15.0  INR 1.20   ABG No results found for this basename: PHART, PCO2, PO2, HCO3,  in the last 72 hours  Studies/Results: No results found.  Anti-infectives: Anti-infectives   Start     Dose/Rate Route Frequency Ordered Stop   05/16/13 1000  vancomycin (VANCOCIN) IVPB 1000 mg/200 mL premix     1,000 mg 200 mL/hr over 60 Minutes Intravenous Every 48 hours 05/16/13 0810     05/16/13 0900  piperacillin-tazobactam (ZOSYN) IVPB 2.25 g     2.25 g 100 mL/hr over 30 Minutes Intravenous Every 8 hours 05/16/13 0808     05/15/13 1200  ampicillin-sulbactam (UNASYN) 1.5 g in sodium chloride 0.9 % 50 mL IVPB  Status:  Discontinued      1.5 g 100 mL/hr over 30 Minutes Intravenous Every 12 hours 05/15/13 1121 05/16/13 0752   05/14/13 2330  clindamycin (CLEOCIN) IVPB 600 mg  Status:  Discontinued     600 mg 100 mL/hr over 30 Minutes Intravenous 3 times per day 05/14/13 2326 05/15/13 1102      Assessment/Plan: s/p * No surgery found * Perirectal abscess. Continue local wound care. Consider switching patient to Augmentin for oral antibiotic coverage. Patient okay for discharge from my standpoint once needs hospitalist requirements.  LOS: 3 days    [name] C 05/17/2013

## 2013-05-17 NOTE — Progress Notes (Signed)
Subjective:  She has no complaints of chest pain, shortness of breath, or perirectal/pain. No complaints of chest pain or chest congestion. She denies rectal bleeding or black tarry stools.  Objective: Vital signs in last 24 hours: Filed Vitals:   05/17/13 1006 05/17/13 1155 05/17/13 1215 05/17/13 1247  BP:  150/68 130/75 142/69  Pulse: 75 78 79 60  Temp:  97.7 F (36.5 C) 97.5 F (36.4 C) 97.8 F (36.6 C)  TempSrc:  Oral Oral Oral  Resp:  22 20 20   Height:      Weight:      SpO2: 98% 98% 97% 98%    Intake/Output Summary (Last 24 hours) at 05/17/13 1327 Last data filed at 05/17/13 1215  Gross per 24 hour  Intake 4104.5 ml  Output    350 ml  Net 3754.5 ml    Weight change: -0.79 kg (-1 lb 11.9 oz)  Physical exam: General: Alert 79 year old overweight Caucasian woman in no acute distress. Lungs: Clear anteriorly with decreased breath sounds in the bases. Heart: S1, S2, with a 2-3/6 systolic murmur. Abdomen: Mildly obese, positive bowel sounds, soft, nontender, nondistended. Right buttock/sacrum/perirectal area: With small amount of malodorous pink discharge and scant surrounding erythema of the incision site; surrounding induration, no tenderness. Extremities: No pedal edema. Arthritic hypertrophic changes of her left knee, but no warmth or palpation of fluid. Mild crepitus with left knee extension and flexion. Overall, no acute hot red joints. Neurologic: She is alert and oriented to herself, hospital, and surroundings. Her speech is clear. Cranial nerves II through XII are grossly intact.  Lab Results: Basic Metabolic Panel:  Recent Labs  45/40/98 2309  05/16/13 0539 05/17/13 0456  NA  --   < > 133* 137  K  --   < > 3.9 4.1  CL  --   < > 101 103  CO2  --   < > 22 23  GLUCOSE  --   < > 157* 225*  BUN  --   < > 47* 47*  CREATININE  --   < > 2.79* 3.14*  CALCIUM  --   < > 9.1 9.5  MG 1.9  --   --   --   < > = values in this interval not displayed. Liver Function  Tests:  Recent Labs  05/14/13 1832 05/14/13 2309  AST 14 17  ALT 11 9  ALKPHOS 38* 32*  BILITOT 0.7 0.7  PROT 7.6 6.9  ALBUMIN 4.1 3.5   No results found for this basename: LIPASE, AMYLASE,  in the last 72 hours No results found for this basename: AMMONIA,  in the last 72 hours CBC:  Recent Labs  05/14/13 1832  05/16/13 0539 05/17/13 0456  WBC 8.9  < > 5.1 3.6*  NEUTROABS 7.0  --   --   --   HGB 9.8*  < > 7.9* 7.3*  HCT 29.4*  < > 23.0* 21.8*  MCV 89.4  < > 88.8 89.3  PLT 159  < > 148* 156  < > = values in this interval not displayed. Cardiac Enzymes:  Recent Labs  05/14/13 1832 05/14/13 2309  CKTOTAL  --  428*  TROPONINI <0.30  --    BNP:  Recent Labs  05/14/13 1832  PROBNP 4841.0*   D-Dimer: No results found for this basename: DDIMER,  in the last 72 hours CBG:  Recent Labs  05/16/13 1132 05/16/13 1632 05/16/13 2115 05/17/13 0019 05/17/13 0730 05/17/13 1126  GLUCAP 199*  198* 276* 265* 205* 245*   Hemoglobin A1C:  Recent Labs  05/14/13 2309  HGBA1C 6.9*   Fasting Lipid Panel: No results found for this basename: CHOL, HDL, LDLCALC, TRIG, CHOLHDL, LDLDIRECT,  in the last 72 hours Thyroid Function Tests:  Recent Labs  05/14/13 2309  TSH 1.847   Anemia Panel:  Recent Labs  05/15/13 0639  VITAMINB12 244   Coagulation:  Recent Labs  05/14/13 1832  LABPROT 15.0  INR 1.20   Urine Drug Screen: Drugs of Abuse  No results found for this basename: labopia,  cocainscrnur,  labbenz,  amphetmu,  thcu,  labbarb    Alcohol Level: No results found for this basename: ETH,  in the last 72 hours Urinalysis:  Recent Labs  05/14/13 2038  COLORURINE YELLOW  LABSPEC 1.020  PHURINE 6.0  GLUCOSEU NEGATIVE  HGBUR LARGE*  BILIRUBINUR NEGATIVE  KETONESUR NEGATIVE  PROTEINUR 100*  UROBILINOGEN 0.2  NITRITE NEGATIVE  LEUKOCYTESUR NEGATIVE   Misc. Labs:   Micro: Recent Results (from the past 240 hour(s))  CLOSTRIDIUM DIFFICILE BY  PCR     Status: None   Collection Time    05/15/13 10:35 AM      Result Value Range Status   C difficile by pcr NEGATIVE  NEGATIVE Final  CULTURE, ROUTINE-ABSCESS     Status: None   Collection Time    05/15/13 12:40 PM      Result Value Range Status   Specimen Description ABSCESS PERIRECTAL   Final   Special Requests NONE   Final   Gram Stain     Final   Value: RARE WBC PRESENT, PREDOMINANTLY PMN     NO SQUAMOUS EPITHELIAL CELLS SEEN     FEW GRAM POSITIVE COCCI     IN PAIRS FEW GRAM NEGATIVE RODS   Culture ABUNDANT GRAM NEGATIVE RODS   Final   Report Status PENDING   Incomplete    Studies/Results: No results found.  Medications:  Scheduled: . acidophilus  2 capsule Oral Daily  . amLODipine  10 mg Oral Daily  . atorvastatin  40 mg Oral QHS  . cloNIDine  0.2 mg Transdermal Weekly  . docusate sodium  100 mg Oral BID  . fenofibrate  54 mg Oral Daily  . furosemide  40 mg Oral Daily  . insulin aspart  0-5 Units Subcutaneous QHS  . insulin aspart  0-9 Units Subcutaneous TID WC  . insulin NPH  5 Units Subcutaneous BID AC  . isosorbide mononitrate  120 mg Oral Daily  . labetalol  100 mg Oral BID  . omega-3 acid ethyl esters  1 g Oral BID  . piperacillin-tazobactam (ZOSYN)  IV  2.25 g Intravenous Q8H  . vancomycin  1,000 mg Intravenous Q48H   Continuous: . sodium chloride 30 mL/hr at 05/16/13 1554   [id number], haloperidol lactate, hydrALAZINE, meclizine, nitroGLYCERIN, ondansetron (ZOFRAN) IV, sorbitol  Assessment: Principal Problem:   Fall Active Problems:   Perianal abscess   Heat exhaustion   DM (diabetes mellitus)   CKD (chronic kidney disease) stage 3, GFR 30-59 ml/min   Hypertension   OA (osteoarthritis)   Chronic anemia   Hyponatremia   Overweight   Acute renal insufficiency   DJD (degenerative joint disease) of lumbar spine   1. Fall. Etiology unclear, but the patient denies loss of consciousness and says that her left knee gave away. She appears  to be neurologically intact. No obvious evidence of an ischemic event. Her CK is in the 400s, not consistent  with rhabdomyolysis. CT scans negative for acute fractures.  Fever. Now resolved. This was presumably secondary to the perianal abscess. Her urinalysis is unremarkable. Her chest x-ray is unremarkable.  Perianal/perirectal abscess. Postoperative day #2, incision and drainage by general surgeon Dr. Leticia Penna. Clindamycin has been started, but discontinued in favor of  Zosyn. Vancomycin added. C. difficile PCR negative. We'll narrow antibiotic to Augmentin.  Acute renal insufficiency superimposed on stage III chronic kidney disease. The patient's baseline creatinine is approximately 2.3-2.4. Her creatinine has increased to 3 overnight. We'll discontinue potentially nephrotoxic antibiotics. We'll increase IV fluids back to 75 cc an hour. We'll watch her pulmonary status closely given her history of chronic diastolic heart failure.   History of coronary artery disease/chronic diastolic heart failure/hypertension. Currently stable. Continue Imdur, Catapres patch, omega-3 fatty acids, Lipitor, amlodipine, fenofibrate, and labetalol. Lasix restarted.  Hyponatremia. Her serum sodium is slightly below normal limits. We'll continue to monitor.  Type 2 diabetes mellitus. Reasonable control. We'll continue sliding scale NovoLog and adjust accordingly. Will slightly increase dosing of twice a day NPH (she is on 70/30 insulin at home). Diabetes coordinator recommendations noted, but would prefer NPH twice a day with sliding scale NovoLog while the patient is hospitalized and has acute on chronic renal failure.. Hemoglobin A1c 6.9.  Normocytic anemia. Her hemoglobin has decreased progressively, without overt GI or GU bleeding. May be, in part, secondary to hemodilution and some perioperative blood loss. Her TSH is within normal limits. Her vitamin B12 level is low-normal. We'll give her a couple vitamin B12  injections. She has a history of mild iron deficiency per iron studies in December 2013.Marland Kitchen Last year, her hemoglobin was in the 8.7 range. She reports refusal of colonoscopy in the past and still does. We'll transfuse her one unit of packed red blood cells today.    Plan:  1. Will increase IV fluids back to 75 cc an hour overnight. 2. Transfuse one unit of packed red blood cells today. Followup on the CBC. 3. Discontinue vancomycin and Zosyn. Start Augmentin orally. 4. Recommendations per Dr. Leticia Penna noted. 5. Possible discharge tomorrow with home wound care recommendations by Dr. Dian Situ.      LOS: 3 days   [name] 05/17/2013, 1:27 PM

## 2013-05-17 NOTE — Progress Notes (Signed)
  Subjective: Patient without perirectal complaints.  Still some tenderness but overall improved.  No nausea.    Objective: Vital signs in last 24 hours: Temp:  [97.6 F (36.4 C)-99.6 F (37.6 C)] 97.9 F (36.6 C) (06/19 2117) Pulse Rate:  [57-72] 72 (06/19 2117) Resp:  [16-18] 18 (06/19 2117) BP: (105-149)/(34-62) 149/48 mmHg (06/19 2117) SpO2:  [96 %-99 %] 99 % (06/19 2117) Weight:  [78.79 kg (173 lb 11.2 oz)] 78.79 kg (173 lb 11.2 oz) (06/19 0411) Last BM Date: 05/16/13  Intake/Output from previous day: 06/19 0701 - 06/20 0700 In: 600 [P.O.:600] Out: 350 [Urine:350] Intake/Output this shift:    General appearance: alert and no distress GI: Perirectal dressing minimal discharge.  Packing in place.    Lab Results:   Recent Labs  05/15/13 0230 05/16/13 0539  WBC 9.1 5.1  HGB 9.1* 7.9*  HCT 27.2* 23.0*  PLT 156 148*   BMET  Recent Labs  05/15/13 0247 05/16/13 0539  NA 132* 133*  K 3.8 3.9  CL 98 101  CO2 21 22  GLUCOSE 145* 157*  BUN 44* 47*  CREATININE 2.79* 2.79*  CALCIUM 9.5 9.1   PT/INR  Recent Labs  05/14/13 1832  LABPROT 15.0  INR 1.20   ABG No results found for this basename: PHART, PCO2, PO2, HCO3,  in the last 72 hours  Studies/Results: No results found.  Anti-infectives: Anti-infectives   Start     Dose/Rate Route Frequency Ordered Stop   05/16/13 1000  vancomycin (VANCOCIN) IVPB 1000 mg/200 mL premix     1,000 mg 200 mL/hr over 60 Minutes Intravenous Every 48 hours 05/16/13 0810     05/16/13 0900  piperacillin-tazobactam (ZOSYN) IVPB 2.25 g     2.25 g 100 mL/hr over 30 Minutes Intravenous Every 8 hours 05/16/13 0808     05/15/13 1200  ampicillin-sulbactam (UNASYN) 1.5 g in sodium chloride 0.9 % 50 mL IVPB  Status:  Discontinued     1.5 g 100 mL/hr over 30 Minutes Intravenous Every 12 hours 05/15/13 1121 05/16/13 0752   05/14/13 2330  clindamycin (CLEOCIN) IVPB 600 mg  Status:  Discontinued     600 mg 100 mL/hr over 30 Minutes  Intravenous 3 times per day 05/14/13 2326 05/15/13 1102      Assessment/Plan: s/p * No surgery found * Perirectal abscess.  COntinue abx coverage.  COntinue local wound care.  Likely discharge tomorrow.    LOS: 3 days    Roisin Mones C 05/17/2013

## 2013-05-17 NOTE — Progress Notes (Signed)
Physical Therapy Treatment Patient Details Name: [name] MRN: [id number] DOB: Jan 30, 1932 Today's Date: 05/17/2013 Time: 4098-1191 PT Time Calculation (min): 20 min  PT Assessment / Plan / Recommendation Comments on Treatment Session  Pt is much improved with overall mobility.  She is still more steady using a walker for gait and is quite functional with it.  She is able to ascend/descend steps with no assistance.  Goals for PT have been met.    Follow Up Recommendations        Does the patient have the potential to tolerate intense rehabilitation     Barriers to Discharge        Equipment Recommendations       Recommendations for Other Services    Frequency     Plan All goals met and education completed, patient dischaged from PT services    Precautions / Restrictions     Pertinent Vitals/Pain     Mobility  Bed Mobility Supine to Sit: 7: Independent Sit to Supine: 7: Independent Transfers Sit to Stand: 6: Modified independent (Device/Increase time);From bed Stand to Sit: 6: Modified independent (Device/Increase time);To bed Details for Transfer Assistance: no unsteadiness with stance Ambulation/Gait Ambulation/Gait Assistance: 6: Modified independent (Device/Increase time) Ambulation Distance (Feet): 175 Feet Assistive device: Rolling walker Gait Pattern: Within Functional Limits Gait velocity: WNL Stairs: Yes Stairs Assistance: 6: Modified independent (Device/Increase time) Stair Management Technique: One rail Right;Forwards Number of Stairs: 3 Wheelchair Mobility Wheelchair Mobility: No    Exercises     PT Diagnosis:    PT Problem List:   PT Treatment Interventions:     PT Goals Acute Rehab PT Goals PT Goal: Supine/Side to Sit - Progress: Met PT Goal: Sit to Supine/Side - Progress: Met PT Goal: Sit to Stand - Progress: Met PT Goal: Stand to Sit - Progress: Met PT Goal: Ambulate - Progress: Met Pt will Go Up / Down Stairs: with modified  independence;with rail(s) PT Goal: Up/Down Stairs - Progress: Met (pt will enter through back door with only 3 steps)  Visit Information  Last PT Received On: 05/17/13    Subjective Data  Subjective: feels good   Cognition       Balance     End of Session PT - End of Session Equipment Utilized During Treatment: Gait belt Activity Tolerance: Patient tolerated treatment well Patient left: in bed;with call bell/phone within reach   GP     [name] 05/17/2013, 3:20 PM

## 2013-05-18 LAB — GLUCOSE, CAPILLARY: Glucose-Capillary: 154 mg/dL — ABNORMAL HIGH (ref 70–99)

## 2013-05-18 LAB — CBC
MCH: 29.9 pg (ref 26.0–34.0)
MCHC: 33.9 g/dL (ref 30.0–36.0)
Platelets: 191 10*3/uL (ref 150–400)

## 2013-05-18 LAB — BASIC METABOLIC PANEL
BUN: 42 mg/dL — ABNORMAL HIGH (ref 6–23)
Calcium: 9.7 mg/dL (ref 8.4–10.5)
GFR calc non Af Amer: 18 mL/min — ABNORMAL LOW (ref >90)
Glucose, Bld: 163 mg/dL — ABNORMAL HIGH (ref 70–99)

## 2013-05-18 LAB — CULTURE, ROUTINE-ABSCESS

## 2013-05-18 MED ORDER — LISINOPRIL 40 MG PO TABS: 20.0000 mg | ORAL_TABLET | Freq: Every day | ORAL | Status: DC

## 2013-05-18 MED ORDER — AMOXICILLIN-POT CLAVULANATE 500-125 MG PO TABS: 1.0000 | ORAL_TABLET | Freq: Two times a day (BID) | ORAL | Status: DC

## 2013-05-18 NOTE — Progress Notes (Signed)
IV removed, site WNL.  Pt given d/c instructions and new prescriptions.  Discussed home care with patient and discussed home medications, patient verbalizes understanding, teachback completed. F/U appointment in place with Dr Christell Constant and Kristian Covey, pt aware she needs to make appt with Dr Leticia Penna, pt states she will make and keep appointment.  Discussed and demonstrated wound care with patient and her daughter, teachback done, pt sent home with enough supplies to last until follow up MD appt with Dr Leticia Penna. Pt is stable at this time and desires to go home before lunch. Pt has had walker delivered to her room and taking home with her.  HH arranged and notified of DC.  Pt taken to main entrance in wheelchair by staff member.

## 2013-05-18 NOTE — Discharge Summary (Signed)
Physician Discharge Summary  [name] NGE:[id number] DOB: 1932/08/24 DOA: 05/14/2013  PCP: Rudi Heap, MD  Admit date: 05/14/2013 Discharge date: 05/18/2013  Time spent: Greater than 30 minutes  Recommendations for Outpatient Follow-up:  1. Recommend followup assessment of her hemoglobin/hematocrit and renal function at the followup appointments. 2. Home health physical therapy was ordered.  Discharge Diagnoses:   1. Status post fall at home, secondary to degenerative changes of her knees. No evidence of syncope. 2. Perianal abscess secondary to Proteus mirabilis, status post incision and drainage by Dr. Leticia Penna on 05/15/2013. 3. Acute on chronic anemia. Status post 1 unit of packed red blood cell transfusion. Hemoglobin 8.6 prior to discharge. The patient has repeatedly refused upper and/or lower endoscopy. 4. Acute renal insufficiency superimposed on stage III to stage IV chronic kidney disease. BUN 42 and creatinine 2.45 at the time of discharge. 5. Type 2 diabetes mellitus. 6. Mild hyponatremia, hypovolemia. Resolved with IV fluids. 7. Hypertension. Stable. 8. Overweight. 9. Degenerative joint disease/osteoarthritis. 10. Mild intermittent leukopenia. WBC 3.5 prior to discharge. 11. Mild dementia. 12. Coronary artery disease. Remains stable.  Discharge Condition: Improved.  Diet recommendation: Carbohydrate modified/heart healthy.  Filed Weights   05/16/13 0411 05/17/13 0443 05/18/13 0455  Weight: 78.79 kg (173 lb 11.2 oz) 78 kg (171 lb 15.3 oz) 80.7 kg (177 lb 14.6 oz)    History of present illness:  The patient is a 79 year old woman with a history significant for coronary artery disease, chronic kidney disease, and diabetes mellitus, who presented to the emergency department on 05/14/2013 after being found on the floor at home by her daughter. The patient reported that her knees gave away and she fell. She was unable to get up. She Christell Constant reported that she was  incontinent of urine while on the floor. She had subjective fevers. In the emergency department, she was febrile with temperature 102.4 degrees. Otherwise, she was hemodynamically stable though mildly hypertensive. Her lab data were significant for a serum sodium of 133, BUN of 46, creatinine of 3.11, proBNP of 4841, normal troponin I., hemoglobin of 9.8, and glucose of 164. Her urinalysis was not consistent with infection. Her EKG revealed normal sinus rhythm with a heart rate of 82 beats per minute, first degree AV block, nonspecific ST and T-wave abnormalities. CT of her abdomen and pelvis revealed stable parapelvic cysts and an exophtic uterine fibroid, unchanged. CT scan of her head revealed no acute intracranial abnormality. CT of her cervical spine revealed multilevel cervical spondylosis, but no acute bony abnormality. On exam, Dr. Orvan Falconer noted swelling and erythema of the right perianal area. She was admitted for further evaluation and management.  Hospital Course:   1. Perianal abscess, secondary to Proteus mirabilis. The patient was started on gentle IV fluids. Although her pro BNP was elevated, she did not appear to have decompensated heart failure and appeared rather dry. Clindamycin was started empirically. Sliding scale NovoLog was initiated for treatment of her hypertension. Most of her medications were continued with exception of her ACE inhibitor. She was found to have liquidy brown stool, and therefore, clindamycin was discontinued. Her stool specimen was sent for evaluation and it was negative for C. difficile PCR. Subsequently, she was started on Zosyn and vancomycin. General surgeon, Dr. Dian Situ was consulted. Following his evaluation, he proceeded with incision and drainage of the abscess. Per his operative report, there was a large amount of purulent malodorous discharge. A specimen was sent and it eventually grew out abundant Proteus mirabilis. After several  days of IV antibiotics,  they were discontinued in favor of Augmentin for which the Proteus was sensitive to. Her fever resolved. She felt better.  2. Acute on chronic anemia. The patient has a known history of anemia, presumably secondary to chronic disease/chronic kidney disease. She has refused evaluation with endoscopies in the past. Upon my questioning, she still refused. Her hemoglobin fell to a nadir of 7.3. There was no significant blood loss during the incision and drainage. There is no bright red blood parenchymal or black tarry stools. Nevertheless, she was transfused one unit of packed red blood cells. Her hemoglobin improved appropriately and was 8.6 prior to discharge. Further evaluation and management will be deferred to her primary care physician.  3. Hyponatremia. The patient was started on IV fluid hydration with normal saline. Following several days of normal saline, her serum sodium improved. Her TSH was within normal limits at 1.8.  4. Hypertension/coronary artery disease/chronic diastolic heart failure. She was maintained on all of her chronic cardiac medications with exception of lisinopril due to slightly worsening renal failure. Her troponin I was negative. Her total CK was elevated at 428, not indicative of rhabdomyolysis or acute coronary syndrome. Her proBNP was elevated, but this was likely the consequence of her worsening renal function. She was gently hydrated. Following hydration, Lasix was restarted. Lisinopril dosing was decreased to 10 mg instead of 20 mg upon discharge due to her slightly worsened renal function.   5. Stage III chronic kidney disease with acute renal insufficiency. The patient's creatinine was 3.11 on admission. This is above her baseline of 2.2-2.3. Lisinopril and Lasix were discontinued temporarily. She was started on gentle IV fluids for several days. Her renal function improved to 2.45. Upon discharge, lisinopril was restarted at a lower dose of 10 mg daily. And Lasix was  restarted. Further management will be deferred to her nephrologist, Dr. Kristian Covey.  6. Type 2 diabetes mellitus. Her diabetes was fairly well controlled on the regimen ordered. Her hemoglobin A1c was 6.9. She was discharged on her previous home regimen.  7. Deconditioning. The physical therapist evaluated her. The therapist recommended home health physical therapy which was ordered.  Procedures:  Incision and drainage of perianal abscess per Dr. Dian Situ.  Consultations:  General surgeon, Tilford Pillar M.D.  Discharge Exam: Filed Vitals:   05/18/13 0455 05/18/13 0625 05/18/13 0628 05/18/13 0632  BP:  161/65 171/77 161/74  Pulse:  62 68 65  Temp:  98.2 F (36.8 C)    TempSrc:  Oral    Resp:  18 18 18   Height:      Weight: 80.7 kg (177 lb 14.6 oz)     SpO2:  99% 97% 97%    General: No acute distress. Cardiovascular: S1, S2, with a soft systolic murmur. Respiratory: Clear to auscultation bilaterally. Right gluteal area with decrease in erythema and induration. No evidence of foul-smelling drainage. Nontender.  Discharge Instructions  Discharge Orders   Future Appointments Provider Department Dept Phone   06/21/2013 9:00 AM Ileana Ladd, MD [facility] [phone]   Future Orders Complete By Expires     Diet - low sodium heart healthy  As directed     Diet Carb Modified  As directed     Discharge wound care:  As directed     Comments:      Clean the buttock incision site daily with mild soapy water. Pack with iodoform dressing daily and cover with 4 x 4 gauze. Call Dr. Leticia Penna  for further instructions as needed.    Increase activity slowly  As directed         Medication List    TAKE these medications       amLODipine 10 MG tablet  Commonly known as:  NORVASC  Take 10 mg by mouth daily.     amoxicillin-clavulanate 500-125 MG per tablet  Commonly known as:  AUGMENTIN  Take 1 tablet (500 mg total) by mouth 2 (two) times daily with a meal.  Antibiotic to be taken for 6 more days.     aspirin EC 81 MG tablet  Take 81 mg by mouth daily.     atorvastatin 40 MG tablet  Commonly known as:  LIPITOR  Take 40 mg by mouth at bedtime.     fenofibrate 54 MG tablet  Take 1 tablet (54 mg total) by mouth daily.     fish oil-omega-3 fatty acids 1000 MG capsule  Take 1 g by mouth daily.     furosemide 40 MG tablet  Commonly known as:  LASIX  Take 40 mg by mouth daily.     insulin NPH-regular (70-30) 100 UNIT/ML injection  Commonly known as:  NOVOLIN 70/30  Inject 25 Units into the skin 2 (two) times daily.     iron polysaccharides 150 MG capsule  Commonly known as:  NIFEREX  Take 1 capsule (150 mg total) by mouth 2 (two) times daily.     isosorbide mononitrate 120 MG 24 hr tablet  Commonly known as:  IMDUR  Take 120 mg by mouth daily.     labetalol 200 MG tablet  Commonly known as:  NORMODYNE  Take 200 mg by mouth 2 (two) times daily.     lisinopril 40 MG tablet  Commonly known as:  PRINIVIL,ZESTRIL  Take 0.5 tablets (20 mg total) by mouth daily. Given by Dr Kristian Covey     nitroGLYCERIN 0.4 MG SL tablet  Commonly known as:  NITROSTAT  Place 1 tablet (0.4 mg total) under the tongue every 5 (five) minutes x 3 doses as needed for chest pain.       Allergies  Allergen Reactions  . Propoxyphene-Acetaminophen   . Simvastatin Other (See Comments)    headache       Follow-up Information   Call ZIEGLER,BRENT C, MD. (Call for follow up to be seen in 1-2 weeks.)    Contact information:   [address] [place] [address] [phone]       Follow up with [facility] S, MD. (Follow up as scheduled)    Contact information:   [address] [place] [address] [phone]       Call Rudi Heap, MD. (Call for follow up in 1-2 weeks.)    Contact information:   [address] [place] [address] [phone]        The results of significant diagnostics from this hospitalization  (including imaging, microbiology, ancillary and laboratory) are listed below for reference.    Significant Diagnostic Studies: Ct Abdomen Pelvis Wo Contrast  05/14/2013   *RADIOLOGY REPORT*  Clinical Data: Periumbilical abscess  CT ABDOMEN AND PELVIS WITHOUT CONTRAST  Technique:  Multidetector CT imaging of the abdomen and pelvis was performed following the standard protocol without intravenous contrast.  Comparison: March 21, 2008.  Findings: Severe degenerative disc disease is noted at T12-L1. Severe degenerative disc disease is noted at L3-4 with grade 1 anterolisthesis secondary to posterior facet joint hypertrophy. Visualized lung bases appear normal.  No focal  abnormality seen involving the liver, spleen or pancreas. No gallstones are noted. Adrenal glands appear normal.  No renal or ureteral calculi are noted.  Left-sided peripelvic cysts are again noted.  No definite hydronephrosis or renal obstruction is noted.  Status post appendectomy.  Urinary bladder appears normal.  Probable exophytic fibroid is seen arising from uterine fundus which is unchanged compared to prior exam.  Atherosclerotic calcifications of abdominal aorta are noted.  IMPRESSION: Stable left parapelvic cysts compared to prior exam.  Probable exophytic uterine fibroid which is unchanged compared to prior exam.  No hydronephrosis or renal obstruction is noted. Degenerative disc disease of T12-L1 and L3-4 is noted.   Original Report Authenticated By: Lupita Raider.,  M.D.   Ct Head Wo Contrast  05/14/2013   *RADIOLOGY REPORT*  Clinical Data:  Dementia and found on floor.  CT HEAD WITHOUT CONTRAST CT CERVICAL SPINE WITHOUT CONTRAST  Technique:  Multidetector CT imaging of the head and cervical spine was performed following the standard protocol without intravenous contrast.  Multiplanar CT image reconstructions of the cervical spine were also generated.  Comparison:  01/25/2013  CT HEAD  Findings: No evidence for acute hemorrhage,  mass lesion, midline shift, hydrocephalus or large infarct.  The visualized paranasal sinuses are clear.  No acute bony abnormality. There is subtle low density in the periventricular and subcortical white matter. Findings are suggestive for chronic changes.  IMPRESSION: No acute intracranial abnormality.  CT CERVICAL SPINE  Findings: Lung apices are clear.  Multilevel cervical spondylosis. No acute bony abnormality.  The thyroid tissue is heterogeneous.  1 cm nodule in the left thyroid lobe.  Normal alignment of the cervical spine.  IMPRESSION: Multilevel cervical spondylosis.  No acute bony abnormality.   Original Report Authenticated By: Richarda Overlie, M.D.   Ct Cervical Spine Wo Contrast  05/14/2013   *RADIOLOGY REPORT*  Clinical Data:  Dementia and found on floor.  CT HEAD WITHOUT CONTRAST CT CERVICAL SPINE WITHOUT CONTRAST  Technique:  Multidetector CT imaging of the head and cervical spine was performed following the standard protocol without intravenous contrast.  Multiplanar CT image reconstructions of the cervical spine were also generated.  Comparison:  01/25/2013  CT HEAD  Findings: No evidence for acute hemorrhage, mass lesion, midline shift, hydrocephalus or large infarct.  The visualized paranasal sinuses are clear.  No acute bony abnormality. There is subtle low density in the periventricular and subcortical white matter. Findings are suggestive for chronic changes.  IMPRESSION: No acute intracranial abnormality.  CT CERVICAL SPINE  Findings: Lung apices are clear.  Multilevel cervical spondylosis. No acute bony abnormality.  The thyroid tissue is heterogeneous.  1 cm nodule in the left thyroid lobe.  Normal alignment of the cervical spine.  IMPRESSION: Multilevel cervical spondylosis.  No acute bony abnormality.   Original Report Authenticated By: Richarda Overlie, M.D.   Dg Chest Port 1 View  05/14/2013   *RADIOLOGY REPORT*  Clinical Data: Fever, urinary incontinence, fall  PORTABLE CHEST - 1 VIEW   Comparison: 10/30/2012  Findings: Normal heart size.  Atherosclerotic calcification of the aortic arch.  Normal hilar contours.  Normal pulmonary vascularity. Lung volumes slightly low but the lungs are clear.  Negative for pleural effusion or pneumothorax.  No acute osseous abnormality.  IMPRESSION: No acute cardiopulmonary disease.   Original Report Authenticated By: Britta Mccreedy, M.D.    Microbiology: Recent Results (from the past 240 hour(s))  CLOSTRIDIUM DIFFICILE BY PCR     Status: None   Collection Time  05/15/13 10:35 AM      Result Value Range Status   C difficile by pcr NEGATIVE  NEGATIVE Final  CULTURE, ROUTINE-ABSCESS     Status: None   Collection Time    05/15/13 12:40 PM      Result Value Range Status   Specimen Description ABSCESS PERIRECTAL   Final   Special Requests NONE   Final   Gram Stain     Final   Value: RARE WBC PRESENT, PREDOMINANTLY PMN     NO SQUAMOUS EPITHELIAL CELLS SEEN     FEW GRAM POSITIVE COCCI     IN PAIRS FEW GRAM NEGATIVE RODS   Culture ABUNDANT PROTEUS MIRABILIS   Final   Report Status 05/18/2013 FINAL   Final   Organism ID, Bacteria PROTEUS MIRABILIS   Final     Labs: Basic Metabolic Panel:  Recent Labs Lab 05/14/13 1832 05/14/13 2309 05/15/13 0247 05/16/13 0539 05/17/13 0456 05/18/13 0620  NA 133*  --  132* 133* 137 138  K 4.0  --  3.8 3.9 4.1 4.3  CL 96  --  98 101 103 107  CO2 24  --  21 22 23 24   GLUCOSE 164*  --  145* 157* 225* 163*  BUN 46*  --  44* 47* 47* 42*  CREATININE 3.11*  --  2.79* 2.79* 3.14* 2.45*  CALCIUM 10.1  --  9.5 9.1 9.5 9.7  MG  --  1.9  --   --   --   --    Liver Function Tests:  Recent Labs Lab 05/14/13 1832 05/14/13 2309  AST 14 17  ALT 11 9  ALKPHOS 38* 32*  BILITOT 0.7 0.7  PROT 7.6 6.9  ALBUMIN 4.1 3.5   No results found for this basename: LIPASE, AMYLASE,  in the last 168 hours No results found for this basename: AMMONIA,  in the last 168 hours CBC:  Recent Labs Lab 05/14/13 1832  05/15/13 0230 05/16/13 0539 05/17/13 0456 05/17/13 1604 05/18/13 0620  WBC 8.9 9.1 5.1 3.6* 3.6* 3.5*  NEUTROABS 7.0  --   --   --   --   --   HGB 9.8* 9.1* 7.9* 7.3* 8.7* 8.6*  HCT 29.4* 27.2* 23.0* 21.8* 25.6* 25.4*  MCV 89.4 90.7 88.8 89.3 88.3 88.2  PLT 159 156 148* 156 177 191   Cardiac Enzymes:  Recent Labs Lab 05/14/13 1832 05/14/13 2309  CKTOTAL  --  428*  TROPONINI <0.30  --    BNP: BNP (last 3 results)  Recent Labs  10/26/12 0835 10/29/12 0518 05/14/13 1832  PROBNP 3196.0* 4674.0* 4841.0*   CBG:  Recent Labs Lab 05/17/13 1126 05/17/13 1618 05/17/13 2051 05/18/13 0717 05/18/13 1117  GLUCAP 245* 242* 251* 154* 139*       Signed:  Augusta Hilbert  [facility] 05/18/2013, 2:31 PM

## 2013-05-19 LAB — TYPE AND SCREEN
ABO/RH(D): A POS
Unit division: 0
Unit division: 0

## 2013-06-03 ENCOUNTER — Other Ambulatory Visit: Payer: Self-pay

## 2013-06-03 MED ORDER — POLYSACCHARIDE IRON COMPLEX 150 MG PO CAPS: 150.0000 mg | ORAL_CAPSULE | Freq: Two times a day (BID) | ORAL | Status: DC

## 2013-06-04 ENCOUNTER — Ambulatory Visit: Payer: Medicare Other | Admitting: Family Medicine

## 2013-06-05 ENCOUNTER — Other Ambulatory Visit: Payer: Self-pay | Admitting: Cardiology

## 2013-06-07 ENCOUNTER — Telehealth: Payer: Self-pay | Admitting: Family Medicine

## 2013-06-07 NOTE — Telephone Encounter (Signed)
Spoke with pt and she stated been having black runny stools for 3 days. Pt advised to go to ER to get evaluated per Dr Modesto Charon Pt verbalized understanding.

## 2013-06-21 ENCOUNTER — Encounter: Payer: Self-pay | Admitting: Family Medicine

## 2013-06-21 ENCOUNTER — Other Ambulatory Visit: Payer: Self-pay | Admitting: Cardiology

## 2013-06-21 ENCOUNTER — Ambulatory Visit (INDEPENDENT_AMBULATORY_CARE_PROVIDER_SITE_OTHER): Payer: Medicare Other | Admitting: Family Medicine

## 2013-06-21 VITALS — BP 172/65 | HR 76 | Temp 99.4°F | Ht 64.0 in | Wt 158.2 lb

## 2013-06-21 DIAGNOSIS — E663 Overweight: Secondary | ICD-10-CM

## 2013-06-21 DIAGNOSIS — R635 Abnormal weight gain: Secondary | ICD-10-CM

## 2013-06-21 DIAGNOSIS — E785 Hyperlipidemia, unspecified: Secondary | ICD-10-CM

## 2013-06-21 DIAGNOSIS — N183 Chronic kidney disease, stage 3 unspecified: Secondary | ICD-10-CM

## 2013-06-21 DIAGNOSIS — R011 Cardiac murmur, unspecified: Secondary | ICD-10-CM

## 2013-06-21 DIAGNOSIS — R079 Chest pain, unspecified: Secondary | ICD-10-CM

## 2013-06-21 DIAGNOSIS — I5032 Chronic diastolic (congestive) heart failure: Secondary | ICD-10-CM

## 2013-06-21 DIAGNOSIS — I10 Essential (primary) hypertension: Secondary | ICD-10-CM

## 2013-06-21 DIAGNOSIS — E119 Type 2 diabetes mellitus without complications: Secondary | ICD-10-CM

## 2013-06-21 DIAGNOSIS — R002 Palpitations: Secondary | ICD-10-CM | POA: Insufficient documentation

## 2013-06-21 DIAGNOSIS — M1990 Unspecified osteoarthritis, unspecified site: Secondary | ICD-10-CM

## 2013-06-21 DIAGNOSIS — I679 Cerebrovascular disease, unspecified: Secondary | ICD-10-CM

## 2013-06-21 DIAGNOSIS — D649 Anemia, unspecified: Secondary | ICD-10-CM

## 2013-06-21 LAB — COMPLETE METABOLIC PANEL WITH GFR
ALT: 12 U/L (ref 0–35)
AST: 12 U/L (ref 0–37)
Albumin: 4.2 g/dL (ref 3.5–5.2)
Alkaline Phosphatase: 36 U/L — ABNORMAL LOW (ref 39–117)
BUN: 34 mg/dL — ABNORMAL HIGH (ref 6–23)
CO2: 26 mEq/L (ref 19–32)
Calcium: 10.1 mg/dL (ref 8.4–10.5)
Chloride: 102 mEq/L (ref 96–112)
Creat: 2.89 mg/dL — ABNORMAL HIGH (ref 0.50–1.10)
GFR, Est African American: 17 mL/min — ABNORMAL LOW
GFR, Est Non African American: 15 mL/min — ABNORMAL LOW
Glucose, Bld: 206 mg/dL — ABNORMAL HIGH (ref 70–99)
Potassium: 4.3 mEq/L (ref 3.5–5.3)
Sodium: 139 mEq/L (ref 135–145)
Total Bilirubin: 0.5 mg/dL (ref 0.3–1.2)
Total Protein: 6.7 g/dL (ref 6.0–8.3)

## 2013-06-21 LAB — POCT CBC
Granulocyte percent: 78.3 %G (ref 37–80)
HCT, POC: 27.9 % — AB (ref 37.7–47.9)
Hemoglobin: 9.4 g/dL — AB (ref 12.2–16.2)
Lymph, poc: 0.9 (ref 0.6–3.4)
MCH, POC: 29.1 pg (ref 27–31.2)
MCHC: 33.5 g/dL (ref 31.8–35.4)
MCV: 86.9 fL (ref 80–97)
MPV: 6.9 fL (ref 0–99.8)
POC Granulocyte: 5.1 (ref 2–6.9)
POC LYMPH PERCENT: 14.3 %L (ref 10–50)
Platelet Count, POC: 267 10*3/uL (ref 142–424)
RBC: 3.2 M/uL — AB (ref 4.04–5.48)
RDW, POC: 13.6 %
WBC: 6.5 10*3/uL (ref 4.6–10.2)

## 2013-06-21 LAB — THYROID PANEL WITH TSH
Free Thyroxine Index: 2.3 (ref 1.0–3.9)
T3 Uptake: 28 % (ref 22.5–37.0)
T4, Total: 8.2 ug/dL (ref 5.0–12.5)
TSH: 2.7 u[IU]/mL (ref 0.350–4.500)

## 2013-06-21 LAB — MAGNESIUM: Magnesium: 2 mg/dL (ref 1.5–2.5)

## 2013-06-21 NOTE — Progress Notes (Signed)
Patient ID: [name], female   DOB: [birth year], 79 y.o.   MRN: [id number] SUBJECTIVE: CC: Chief Complaint  Patient presents with  . Follow-up    3 month follow up   nonfasting  dont know what meds she takes t [employer] . states was seen at [facility] 1 month ago for bump on resctal area but stated she does not want dr Modesto Charon checking     HPI: Patient is here for follow up of Diabetes Mellitus: Symptoms of DM: Denies Nocturia ,Denies Urinary Frequency , denies Blurred vision ,deniesDizziness,denies.Dysuria,denies paresthesias, denies extremity pain or ulcers.Marland Kitchendenies chest pain. has had an annual eye exam. do check the feet. Does check CBGs. Average CBG:105 to 110 , HGBA1C was 6.9 last month. Denies episodes of hypoglycemia. Does have an emergency hypoglycemic plan. admits toCompliance with medications. Denies Problems with medications.  Has had palpitations.  Had a rectal abscess which was surgically drained.  Past Medical History  Diagnosis Date  . CAD (coronary artery disease) 2003    Last catheterization 2008. Two-vessel PCI of the first OM branch and mid LAD.  Marland Kitchen CVA (cerebral vascular accident) 1996    LEFT SIDE   . IDDM (insulin dependent diabetes mellitus)   . CKD (chronic kidney disease)   . Myocardial infarction   . Congestive heart failure   . Hypercholesteremia   . Hypertension   . Dementia   . DJD (degenerative joint disease) of lumbar spine    Past Surgical History  Procedure Laterality Date  . Appendectomy    . Knee surgery     History   Social History  . Marital Status: Widowed    Spouse Name: N/A    Number of Children: N/A  . Years of Education: N/A   Occupational History  . Not on file.   Social History Main Topics  . Smoking status: Never Smoker   . Smokeless tobacco: Not on file  . Alcohol Use: No  . Drug Use: No  . Sexually Active: No   Other Topics Concern  . Not on file   Social History Narrative   Lives alone, but  has a 49 year old grand daughter staying with her right now.   Family History  Problem Relation Age of Onset  . Diabetes Brother     RETINOPATHY   . Drug abuse Brother   . Liver cancer Brother   . Coronary artery disease Mother   . Breast cancer Daughter   . Heart attack Mother   . Heart attack Father   . Coronary artery disease Father   . Diabetes Sister    Current Outpatient Prescriptions on File Prior to Visit  Medication Sig Dispense Refill  . amLODipine (NORVASC) 10 MG tablet Take 10 mg by mouth daily.      Marland Kitchen amoxicillin-clavulanate (AUGMENTIN) 500-125 MG per tablet Take 1 tablet (500 mg total) by mouth 2 (two) times daily with a meal. Antibiotic to be taken for 6 more days.  12 tablet  0  . aspirin EC 81 MG tablet Take 81 mg by mouth daily.      Marland Kitchen atorvastatin (LIPITOR) 40 MG tablet Take 40 mg by mouth at bedtime.      . fenofibrate 54 MG tablet Take 1 tablet (54 mg total) by mouth daily.  30 tablet  3  . fish oil-omega-3 fatty acids 1000 MG capsule Take 1 g by mouth daily.      . furosemide (LASIX) 40 MG tablet Take 40 mg  by mouth daily.      . furosemide (LASIX) 40 MG tablet TAKE 1 TABLET DAILY  30 tablet  1  . insulin NPH-regular (NOVOLIN 70/30) (70-30) 100 UNIT/ML injection Inject 25 Units into the skin 2 (two) times daily.      . iron polysaccharides (NIFEREX) 150 MG capsule Take 1 capsule (150 mg total) by mouth 2 (two) times daily.  60 capsule  2  . isosorbide mononitrate (IMDUR) 120 MG 24 hr tablet Take 120 mg by mouth daily.      . isosorbide mononitrate (IMDUR) 120 MG 24 hr tablet TAKE 1 TABLET DAILY  30 tablet  1  . labetalol (NORMODYNE) 200 MG tablet Take 200 mg by mouth 2 (two) times daily.       Marland Kitchen lisinopril (PRINIVIL,ZESTRIL) 40 MG tablet Take 0.5 tablets (20 mg total) by mouth daily. Given by Dr Kristian Covey      . nitroGLYCERIN (NITROSTAT) 0.4 MG SL tablet Place 1 tablet (0.4 mg total) under the tongue every 5 (five) minutes x 3 doses as needed for chest pain.  30  tablet  0   No current facility-administered medications on file prior to visit.   Allergies  Allergen Reactions  . Propoxyphene-Acetaminophen   . Simvastatin Other (See Comments)    headache   Immunization History  Administered Date(s) Administered  . Influenza-Generic 08/28/2012  . Pneumococcal-Generic 08/28/2010   Prior to Admission medications   Medication Sig Start Date End Date Taking? Authorizing Provider  amLODipine (NORVASC) 10 MG tablet Take 10 mg by mouth daily.    Historical Provider, MD  amoxicillin-clavulanate (AUGMENTIN) 500-125 MG per tablet Take 1 tablet (500 mg total) by mouth 2 (two) times daily with a meal. Antibiotic to be taken for 6 more days. 05/18/13   Elliot Cousin, MD  aspirin EC 81 MG tablet Take 81 mg by mouth daily.    Historical Provider, MD  atorvastatin (LIPITOR) 40 MG tablet Take 40 mg by mouth at bedtime. 03/21/13   Coralie Keens, FNP  fenofibrate 54 MG tablet Take 1 tablet (54 mg total) by mouth daily. 03/06/13   Ileana Ladd, MD  fish oil-omega-3 fatty acids 1000 MG capsule Take 1 g by mouth daily.    Historical Provider, MD  furosemide (LASIX) 40 MG tablet Take 40 mg by mouth daily.    Historical Provider, MD  furosemide (LASIX) 40 MG tablet TAKE 1 TABLET DAILY 06/05/13   Rollene Rotunda, MD  insulin NPH-regular (NOVOLIN 70/30) (70-30) 100 UNIT/ML injection Inject 25 Units into the skin 2 (two) times daily.    Historical Provider, MD  iron polysaccharides (NIFEREX) 150 MG capsule Take 1 capsule (150 mg total) by mouth 2 (two) times daily. 06/03/13   Ileana Ladd, MD  isosorbide mononitrate (IMDUR) 120 MG 24 hr tablet Take 120 mg by mouth daily.    Historical Provider, MD  isosorbide mononitrate (IMDUR) 120 MG 24 hr tablet TAKE 1 TABLET DAILY 06/05/13   Rollene Rotunda, MD  labetalol (NORMODYNE) 200 MG tablet Take 200 mg by mouth 2 (two) times daily.  12/06/12   Rollene Rotunda, MD  lisinopril (PRINIVIL,ZESTRIL) 40 MG tablet Take 0.5 tablets (20 mg total) by  mouth daily. Given by Dr Kristian Covey 05/18/13   Elliot Cousin, MD  nitroGLYCERIN (NITROSTAT) 0.4 MG SL tablet Place 1 tablet (0.4 mg total) under the tongue every 5 (five) minutes x 3 doses as needed for chest pain. 10/31/12   Nimish Normajean Glasgow, MD    ROS:  As above in the HPI. All other systems are stable or negative.  OBJECTIVE: APPEARANCE:  Patient in no acute distress.The patient appeared well nourished and normally developed. Acyanotic. Waist: VITAL SIGNS:BP 172/65  Pulse 76  Temp(Src) 99.4 F (37.4 C) (Oral)  Ht 5\' 4"  (1.626 m)  Wt 158 lb 3.2 oz (71.759 kg)  BMI 27.14 kg/m2 WF  SKIN: warm and  Dry without overt rashes, tattoos and scars  HEAD and Neck: without JVD, Head and scalp: normal Eyes:No scleral icterus. Fundi normal, eye movements normal. Ears: Auricle normal, canal normal, Tympanic membranes normal, insufflation normal. Nose: normal Throat: normal Neck & thyroid: normal  CHEST & LUNGS: Chest wall: normal Lungs: Clear  CVS: Reveals the PMI to be normally located. Regular rhythm with occasional extra beats First and Second Heart sounds are normal,  2/6 systolicmurmurs, rubs or gallops. Peripheral vasculature: Radial pulses: normal Dorsal pedis pulses: normal Posterior pulses: normal  ABDOMEN:  Appearance: normal Benign, no organomegaly, no masses, no Abdominal Aortic enlargement. No Guarding , no rebound. No Bruits. Bowel sounds: normal  RECTAL: N/A GU: N/A  EXTREMETIES: nonedematous.  NEUROLOGIC: oriented to time,place and person; nonfocal.  ASSESSMENT: Chronic anemia - Plan: POCT CBC  Osteoarthritis  Overweight  Hypertension  Hyperlipidemia  CKD (chronic kidney disease) stage 3, GFR 30-59 ml/min  DIASTOLIC HEART FAILURE, CHRONIC - Plan: Ambulatory referral to Cardiology  Chest pain - Plan: EKG 12-Lead  Cerebrovascular disease - Plan: NMR Lipoprofile with Lipids  CARDIAC MURMUR, SYSTOLIC  DM (diabetes mellitus) - Plan: NMR  Lipoprofile with Lipids  Palpitations - Plan: COMPLETE METABOLIC PANEL WITH GFR, Magnesium, Thyroid Panel With TSH, EKG 12-Lead, Ambulatory referral to Cardiology, CANCELED: EKG 12-Lead  HLD (hyperlipidemia) - Plan: NMR Lipoprofile with Lipids  PLAN: Orders Placed This Encounter  Procedures  . COMPLETE METABOLIC PANEL WITH GFR  . Magnesium  . Thyroid Panel With TSH  . NMR Lipoprofile with Lipids  . Ambulatory referral to Cardiology    Referral Priority:  Routine    Referral Type:  Consultation    Referral Reason:  Specialty Services Required    Requested Specialty:  Cardiology    Number of Visits Requested:  1  . POCT CBC  . EKG 12-Lead    Need for cardiac follow up. Diet and exercise reviewed.  meds reviewed.  EKG reviewed: no acute findings.  Return in about 4 weeks (around 07/19/2013) for Recheck medical problems, recheck BP.  Rocklin Soderquist P. Modesto Charon, M.D.

## 2013-06-24 LAB — NMR LIPOPROFILE WITH LIPIDS
Cholesterol, Total: 177 mg/dL (ref <200)
HDL Particle Number: 35.3 umol/L (ref >=30.5)
HDL Size: 8.3 nm — ABNORMAL LOW (ref >=9.2)
HDL-C: 42 mg/dL (ref >=40)
LDL (calc): 87 mg/dL (ref <100)
LDL Particle Number: 1108 nmol/L — ABNORMAL HIGH (ref <1000)
LDL Size: 21.4 nm (ref >20.5)
LP-IR Score: 50 — ABNORMAL HIGH (ref <=45)
Large HDL-P: <1.3 umol/L — ABNORMAL LOW (ref >=4.8)
Large VLDL-P: 1 nmol/L (ref <=2.7)
Small LDL Particle Number: 513 nmol/L (ref <=527)
Triglycerides: 240 mg/dL — ABNORMAL HIGH (ref <150)
VLDL Size: 47 nm — ABNORMAL HIGH (ref <=46.6)

## 2013-07-04 ENCOUNTER — Other Ambulatory Visit: Payer: Self-pay | Admitting: Family Medicine

## 2013-07-18 ENCOUNTER — Other Ambulatory Visit: Payer: Self-pay | Admitting: Family Medicine

## 2013-07-23 ENCOUNTER — Ambulatory Visit: Payer: Medicare Other | Admitting: Family Medicine

## 2013-07-24 ENCOUNTER — Other Ambulatory Visit: Payer: Self-pay | Admitting: General Practice

## 2013-08-01 ENCOUNTER — Other Ambulatory Visit: Payer: Self-pay | Admitting: Cardiology

## 2013-08-09 ENCOUNTER — Ambulatory Visit (INDEPENDENT_AMBULATORY_CARE_PROVIDER_SITE_OTHER): Payer: Medicare Other | Admitting: Family Medicine

## 2013-08-09 ENCOUNTER — Encounter: Payer: Self-pay | Admitting: Family Medicine

## 2013-08-09 VITALS — BP 184/70 | HR 70 | Temp 97.6°F | Wt 165.0 lb

## 2013-08-09 DIAGNOSIS — M25476 Effusion, unspecified foot: Secondary | ICD-10-CM

## 2013-08-09 DIAGNOSIS — I2510 Atherosclerotic heart disease of native coronary artery without angina pectoris: Secondary | ICD-10-CM

## 2013-08-09 DIAGNOSIS — I7090 Unspecified atherosclerosis: Secondary | ICD-10-CM

## 2013-08-09 DIAGNOSIS — E785 Hyperlipidemia, unspecified: Secondary | ICD-10-CM

## 2013-08-09 DIAGNOSIS — I5032 Chronic diastolic (congestive) heart failure: Secondary | ICD-10-CM

## 2013-08-09 DIAGNOSIS — I679 Cerebrovascular disease, unspecified: Secondary | ICD-10-CM

## 2013-08-09 DIAGNOSIS — E119 Type 2 diabetes mellitus without complications: Secondary | ICD-10-CM

## 2013-08-09 DIAGNOSIS — D649 Anemia, unspecified: Secondary | ICD-10-CM

## 2013-08-09 DIAGNOSIS — E663 Overweight: Secondary | ICD-10-CM

## 2013-08-09 DIAGNOSIS — M1990 Unspecified osteoarthritis, unspecified site: Secondary | ICD-10-CM

## 2013-08-09 DIAGNOSIS — M47817 Spondylosis without myelopathy or radiculopathy, lumbosacral region: Secondary | ICD-10-CM

## 2013-08-09 DIAGNOSIS — I83893 Varicose veins of bilateral lower extremities with other complications: Secondary | ICD-10-CM

## 2013-08-09 DIAGNOSIS — M47816 Spondylosis without myelopathy or radiculopathy, lumbar region: Secondary | ICD-10-CM

## 2013-08-09 DIAGNOSIS — M25473 Effusion, unspecified ankle: Secondary | ICD-10-CM

## 2013-08-09 DIAGNOSIS — R011 Cardiac murmur, unspecified: Secondary | ICD-10-CM

## 2013-08-09 DIAGNOSIS — I10 Essential (primary) hypertension: Secondary | ICD-10-CM

## 2013-08-09 DIAGNOSIS — N183 Chronic kidney disease, stage 3 unspecified: Secondary | ICD-10-CM

## 2013-08-09 MED ORDER — CLONIDINE HCL 0.1 MG PO TABS: 0.1000 mg | ORAL_TABLET | Freq: Two times a day (BID) | ORAL | Status: DC

## 2013-08-09 NOTE — Progress Notes (Signed)
Patient ID: [name], female   DOB: [birth year], 79 y.o.   MRN: [id number] SUBJECTIVE: CC: Chief Complaint  Patient presents with  . Follow-up    1 follow up     HPI: Patient is here for follow up of hypertension: denies Headache;deniesChest Pain;denies weakness;denies Shortness of Breath or Orthopnea;denies Visual changes;denies palpitations;denies cough;denies pedal edema;denies symptoms of TIA or stroke; admits to Compliance with medications. denies Problems with medications. Had a shunt in the left arm for future dialysis. Came to recheck BP.  Past Medical History  Diagnosis Date  . CAD (coronary artery disease) 2003    Last catheterization 2008. Two-vessel PCI of the first OM branch and mid LAD.  Marland Kitchen CVA (cerebral vascular accident) 1996    LEFT SIDE   . IDDM (insulin dependent diabetes mellitus)   . CKD (chronic kidney disease)   . Myocardial infarction   . Congestive heart failure   . Hypercholesteremia   . Hypertension   . Dementia   . DJD (degenerative joint disease) of lumbar spine    Past Surgical History  Procedure Laterality Date  . Appendectomy    . Knee surgery     History   Social History  . Marital Status: Widowed    Spouse Name: N/A    Number of Children: N/A  . Years of Education: N/A   Occupational History  . Not on file.   Social History Main Topics  . Smoking status: Never Smoker   . Smokeless tobacco: Not on file  . Alcohol Use: No  . Drug Use: No  . Sexual Activity: No   Other Topics Concern  . Not on file   Social History Narrative   Lives alone, but has a 52 year old grand daughter staying with her right now.   Family History  Problem Relation Age of Onset  . Diabetes Brother     RETINOPATHY   . Drug abuse Brother   . Liver cancer Brother   . Coronary artery disease Mother   . Breast cancer Daughter   . Heart attack Mother   . Heart attack Father   . Coronary artery disease Father   . Diabetes Sister    Current  Outpatient Prescriptions on File Prior to Visit  Medication Sig Dispense Refill  . amLODipine (NORVASC) 10 MG tablet TAKE 1 TABLET DAILY  30 tablet  2  . aspirin EC 81 MG tablet Take 81 mg by mouth daily.      Marland Kitchen atorvastatin (LIPITOR) 40 MG tablet TAKE 1 TABLET DAILY  90 tablet  1  . fenofibrate 54 MG tablet TAKE 1 TABLET DAILY  30 tablet  5  . fish oil-omega-3 fatty acids 1000 MG capsule Take 1 g by mouth daily.      . furosemide (LASIX) 40 MG tablet TAKE 1 TABLET ONCE A DAY  30 tablet  1  . insulin NPH-regular (NOVOLIN 70/30) (70-30) 100 UNIT/ML injection Inject 25 Units into the skin 2 (two) times daily.      . iron polysaccharides (NIFEREX) 150 MG capsule Take 1 capsule (150 mg total) by mouth 2 (two) times daily.  60 capsule  2  . isosorbide mononitrate (IMDUR) 120 MG 24 hr tablet TAKE 1 TABLET ONCE A DAY  30 tablet  1  . labetalol (NORMODYNE) 200 MG tablet Take 200 mg by mouth 2 (two) times daily.       . nitroGLYCERIN (NITROSTAT) 0.4 MG SL tablet Place 1 tablet (0.4 mg total) under the  tongue every 5 (five) minutes x 3 doses as needed for chest pain.  30 tablet  0  . NOVOLIN 70/30 (70-30) 100 UNIT/ML injection INJECT 25 UNITS SQ EVERY MORNING AND EVENING  20 mL  1   No current facility-administered medications on file prior to visit.   Allergies  Allergen Reactions  . Propoxyphene-Acetaminophen   . Simvastatin Other (See Comments)    headache   Immunization History  Administered Date(s) Administered  . Influenza-Generic 08/28/2012  . Pneumococcal-Generic 08/28/2010   Prior to Admission medications   Medication Sig Start Date End Date Taking? Authorizing Provider  amLODipine (NORVASC) 10 MG tablet TAKE 1 TABLET DAILY 06/21/13  Yes Rollene Rotunda, MD  aspirin EC 81 MG tablet Take 81 mg by mouth daily.   Yes Historical Provider, MD  atorvastatin (LIPITOR) 40 MG tablet TAKE 1 TABLET DAILY 07/18/13  Yes Ileana Ladd, MD  fenofibrate 54 MG tablet TAKE 1 TABLET DAILY 07/04/13  Yes  Ileana Ladd, MD  fish oil-omega-3 fatty acids 1000 MG capsule Take 1 g by mouth daily.   Yes Historical Provider, MD  furosemide (LASIX) 40 MG tablet TAKE 1 TABLET ONCE A DAY 08/01/13  Yes Rollene Rotunda, MD  insulin NPH-regular (NOVOLIN 70/30) (70-30) 100 UNIT/ML injection Inject 25 Units into the skin 2 (two) times daily.   Yes Historical Provider, MD  iron polysaccharides (NIFEREX) 150 MG capsule Take 1 capsule (150 mg total) by mouth 2 (two) times daily. 06/03/13  Yes Ileana Ladd, MD  isosorbide mononitrate (IMDUR) 120 MG 24 hr tablet TAKE 1 TABLET ONCE A DAY 08/01/13  Yes Rollene Rotunda, MD  labetalol (NORMODYNE) 200 MG tablet Take 200 mg by mouth 2 (two) times daily.  12/06/12  Yes Rollene Rotunda, MD  nitroGLYCERIN (NITROSTAT) 0.4 MG SL tablet Place 1 tablet (0.4 mg total) under the tongue every 5 (five) minutes x 3 doses as needed for chest pain. 10/31/12  Yes Nimish Normajean Glasgow, MD  NOVOLIN 70/30 (70-30) 100 UNIT/ML injection INJECT 25 UNITS SQ EVERY MORNING AND EVENING 07/24/13  Yes Ernestina Penna, MD  cloNIDine (CATAPRES) 0.1 MG tablet Take 1 tablet (0.1 mg total) by mouth 2 (two) times daily. 08/09/13   Ileana Ladd, MD     ROS: As above in the HPI. All other systems are stable or negative.  OBJECTIVE: APPEARANCE:  Patient in no acute distress.The patient appeared well nourished and normally developed. Acyanotic. Waist: VITAL SIGNS:BP 184/70  Pulse 70  Temp(Src) 97.6 F (36.4 C) (Oral)  Wt 165 lb (74.844 kg)  BMI 28.31 kg/m2 WF overweight SKIN: warm and  Dry without overt rashes, tattoos and scars  HEAD and Neck: without JVD, Head and scalp: normal Eyes:No scleral icterus. Fundi normal, eye movements normal. Ears: Auricle normal, canal normal, Tympanic membranes normal, insufflation normal. Nose: normal Throat: normal Neck & thyroid: normal  CHEST & LUNGS: Chest wall: normal Lungs: Clear  CVS: Reveals the PMI to be normally located. Regular rhythm, First and Second  Heart sounds are normal,  absence of murmurs, rubs or gallops. Peripheral vasculature: Radial pulses: normal Dorsal pedis pulses: normal Posterior pulses: normal  ABDOMEN:  Appearance: normal Benign, no organomegaly, no masses, no Abdominal Aortic enlargement. No Guarding , no rebound. No Bruits. Bowel sounds: normal  RECTAL: N/A GU: N/A  EXTREMETIES: nonedematous.  MUSCULOSKELETAL:  Spine: normal Joints: intact  NEUROLOGIC: oriented to time,place and person; nonfocal.  ASSESSMENT: HTN (hypertension) - Plan: cloNIDine (CATAPRES) 0.1 MG tablet  Chronic anemia  Osteoarthritis  Swelling of foot joint, unspecified laterality  Varicose veins of lower extremities with other complications  Arteriosclerotic cardiovascular disease (ASCVD)  CARDIAC MURMUR, SYSTOLIC  Cerebrovascular disease  CKD (chronic kidney disease) stage 3, GFR 30-59 ml/min  DIASTOLIC HEART FAILURE, CHRONIC  DJD (degenerative joint disease) of lumbar spine  DM (diabetes mellitus)  Hyperlipidemia  Overweight   PLAN: Meds ordered this encounter  Medications  . cloNIDine (CATAPRES) 0.1 MG tablet    Sig: Take 1 tablet (0.1 mg total) by mouth 2 (two) times daily.    Dispense:  60 tablet    Refill:  3   DASH Diet discussed and handout in the AVS  Goals for BP discussed  Continue other medications.  Return in about 2 weeks (around 08/23/2013) for recheck BP.  Modena Bellemare P. [place] Charon, M.D.

## 2013-08-09 NOTE — Progress Notes (Addendum)
Patient ID: [name], female   DOB: [birth year], 79 y.o.   MRN: [id number] . Error entry above in the H&P in regards to a shunt being placed for future dialysis. Wrong patient entry. Please disregard all mention in this note of a shunt for dialysis.  Layani Foronda P. [place] Charon, M.D.

## 2013-08-09 NOTE — Patient Instructions (Addendum)
DASH Diet  The DASH diet stands for "Dietary Approaches to Stop Hypertension." It is a healthy eating plan that has been shown to reduce high blood pressure (hypertension) in as little as 14 days, while also possibly providing other significant health benefits. These other health benefits include reducing the risk of breast cancer after menopause and reducing the risk of type 2 diabetes, heart disease, colon cancer, and stroke. Health benefits also include weight loss and slowing kidney failure in patients with chronic kidney disease.   DIET GUIDELINES  · Limit salt (sodium). Your diet should contain less than 1500 mg of sodium daily.  · Limit refined or processed carbohydrates. Your diet should include mostly whole grains. Desserts and added sugars should be used sparingly.  · Include small amounts of heart-healthy fats. These types of fats include nuts, oils, and tub margarine. Limit saturated and trans fats. These fats have been shown to be harmful in the body.  CHOOSING FOODS   The following food groups are based on a 2000 calorie diet. See your Registered Dietitian for individual calorie needs.  Grains and Grain Products (6 to 8 servings daily)  · Eat More Often: Whole-wheat bread, brown rice, whole-grain or wheat pasta, quinoa, popcorn without added fat or salt (air popped).  · Eat Less Often: White bread, white pasta, white rice, cornbread.  Vegetables (4 to 5 servings daily)  · Eat More Often: Fresh, frozen, and canned vegetables. Vegetables may be raw, steamed, roasted, or grilled with a minimal amount of fat.  · Eat Less Often/Avoid: Creamed or fried vegetables. Vegetables in a cheese sauce.  Fruit (4 to 5 servings daily)  · Eat More Often: All fresh, canned (in natural juice), or frozen fruits. Dried fruits without added sugar. One hundred percent fruit juice (½ cup [237 mL] daily).  · Eat Less Often: Dried fruits with added sugar. Canned fruit in light or heavy syrup.  Lean Meats, Fish, and Poultry (2  servings or less daily. One serving is 3 to 4 oz [85-114 g]).  · Eat More Often: Ninety percent or leaner ground beef, tenderloin, sirloin. Round cuts of beef, chicken breast, turkey breast. All fish. Grill, bake, or broil your meat. Nothing should be fried.  · Eat Less Often/Avoid: Fatty cuts of meat, turkey, or chicken leg, thigh, or wing. Fried cuts of meat or fish.  Dairy (2 to 3 servings)  · Eat More Often: Low-fat or fat-free milk, low-fat plain or light yogurt, reduced-fat or part-skim cheese.  · Eat Less Often/Avoid: Milk (whole, 2%). Whole milk yogurt. Full-fat cheeses.  Nuts, Seeds, and Legumes (4 to 5 servings per week)  · Eat More Often: All without added salt.  · Eat Less Often/Avoid: Salted nuts and seeds, canned beans with added salt.  Fats and Sweets (limited)  · Eat More Often: Vegetable oils, tub margarines without trans fats, sugar-free gelatin. Mayonnaise and salad dressings.  · Eat Less Often/Avoid: Coconut oils, palm oils, butter, stick margarine, cream, half and half, cookies, candy, pie.  FOR MORE INFORMATION  The Dash Diet Eating Plan: www.dashdiet.org  Document Released: 11/03/2011 Document Revised: 02/06/2012 Document Reviewed: 11/03/2011  [employer]® Patient Information ©2014 [employer].

## 2013-08-12 ENCOUNTER — Encounter: Payer: Self-pay | Admitting: *Deleted

## 2013-08-28 ENCOUNTER — Ambulatory Visit: Payer: Medicare Other | Admitting: Family Medicine

## 2013-08-28 ENCOUNTER — Other Ambulatory Visit: Payer: Self-pay | Admitting: Family Medicine

## 2013-08-29 ENCOUNTER — Encounter: Payer: Self-pay | Admitting: Family Medicine

## 2013-08-29 ENCOUNTER — Ambulatory Visit (INDEPENDENT_AMBULATORY_CARE_PROVIDER_SITE_OTHER): Payer: Medicare Other | Admitting: Family Medicine

## 2013-08-29 VITALS — BP 140/51 | HR 66 | Temp 98.1°F | Wt 161.4 lb

## 2013-08-29 DIAGNOSIS — M25473 Effusion, unspecified ankle: Secondary | ICD-10-CM

## 2013-08-29 DIAGNOSIS — I7090 Unspecified atherosclerosis: Secondary | ICD-10-CM

## 2013-08-29 DIAGNOSIS — R011 Cardiac murmur, unspecified: Secondary | ICD-10-CM

## 2013-08-29 DIAGNOSIS — I5032 Chronic diastolic (congestive) heart failure: Secondary | ICD-10-CM

## 2013-08-29 DIAGNOSIS — M47816 Spondylosis without myelopathy or radiculopathy, lumbar region: Secondary | ICD-10-CM

## 2013-08-29 DIAGNOSIS — I679 Cerebrovascular disease, unspecified: Secondary | ICD-10-CM

## 2013-08-29 DIAGNOSIS — I83893 Varicose veins of bilateral lower extremities with other complications: Secondary | ICD-10-CM

## 2013-08-29 DIAGNOSIS — M47817 Spondylosis without myelopathy or radiculopathy, lumbosacral region: Secondary | ICD-10-CM

## 2013-08-29 DIAGNOSIS — N183 Chronic kidney disease, stage 3 unspecified: Secondary | ICD-10-CM

## 2013-08-29 DIAGNOSIS — D649 Anemia, unspecified: Secondary | ICD-10-CM

## 2013-08-29 DIAGNOSIS — M1990 Unspecified osteoarthritis, unspecified site: Secondary | ICD-10-CM

## 2013-08-29 DIAGNOSIS — E119 Type 2 diabetes mellitus without complications: Secondary | ICD-10-CM

## 2013-08-29 DIAGNOSIS — I10 Essential (primary) hypertension: Secondary | ICD-10-CM

## 2013-08-29 DIAGNOSIS — I2510 Atherosclerotic heart disease of native coronary artery without angina pectoris: Secondary | ICD-10-CM

## 2013-08-29 DIAGNOSIS — M25476 Effusion, unspecified foot: Secondary | ICD-10-CM

## 2013-08-29 MED ORDER — ISOSORBIDE MONONITRATE ER 120 MG PO TB24: ORAL_TABLET | ORAL | Status: DC

## 2013-08-29 MED ORDER — FUROSEMIDE 40 MG PO TABS: ORAL_TABLET | ORAL | Status: DC

## 2013-08-29 MED ORDER — LABETALOL HCL 200 MG PO TABS: 200.0000 mg | ORAL_TABLET | Freq: Two times a day (BID) | ORAL | Status: DC

## 2013-08-29 MED ORDER — FENOFIBRATE 54 MG PO TABS: ORAL_TABLET | ORAL | Status: DC

## 2013-08-29 MED ORDER — AMLODIPINE BESYLATE 10 MG PO TABS: ORAL_TABLET | ORAL | Status: DC

## 2013-08-29 MED ORDER — ATORVASTATIN CALCIUM 40 MG PO TABS: ORAL_TABLET | ORAL | Status: DC

## 2013-08-29 NOTE — Progress Notes (Signed)
Patient ID: [name], female   DOB: 03/29/1932, 79 y.o.   MRN: [id number] SUBJECTIVE: CC: Chief Complaint  Patient presents with  . Follow-up    2 week follow up   . Medication Refill    needs refill     HPI: Patient is here for follow up of hypertension: denies Headache;deniesChest Pain;denies weakness;denies Shortness of Breath or Orthopnea;denies Visual changes;denies palpitations;denies cough;denies pedal edema;denies symptoms of TIA or stroke; admits to Compliance with medications. denies Problems with medications. BP better now Other problems  Stable  Past Medical History  Diagnosis Date  . CAD (coronary artery disease) 2003    Last catheterization 2008. Two-vessel PCI of the first OM branch and mid LAD.  Marland Kitchen CVA (cerebral vascular accident) 1996    LEFT SIDE   . IDDM (insulin dependent diabetes mellitus)   . CKD (chronic kidney disease)   . Myocardial infarction   . Congestive heart failure   . Hypercholesteremia   . Hypertension   . Dementia   . DJD (degenerative joint disease) of lumbar spine    Past Surgical History  Procedure Laterality Date  . Appendectomy    . Knee surgery     History   Social History  . Marital Status: Widowed    Spouse Name: N/A    Number of Children: N/A  . Years of Education: N/A   Occupational History  . Not on file.   Social History Main Topics  . Smoking status: Never Smoker   . Smokeless tobacco: Not on file  . Alcohol Use: No  . Drug Use: No  . Sexual Activity: No   Other Topics Concern  . Not on file   Social History Narrative   Lives alone, but has a 92 year old grand daughter staying with her right now.   Family History  Problem Relation Age of Onset  . Diabetes Brother     RETINOPATHY   . Drug abuse Brother   . Liver cancer Brother   . Coronary artery disease Mother   . Breast cancer Daughter   . Heart attack Mother   . Heart attack Father   . Coronary artery disease Father   . Diabetes Sister     Current Outpatient Prescriptions on File Prior to Visit  Medication Sig Dispense Refill  . amLODipine (NORVASC) 10 MG tablet TAKE 1 TABLET DAILY  30 tablet  2  . aspirin EC 81 MG tablet Take 81 mg by mouth daily.      Marland Kitchen atorvastatin (LIPITOR) 40 MG tablet TAKE 1 TABLET DAILY  90 tablet  1  . cloNIDine (CATAPRES) 0.1 MG tablet Take 1 tablet (0.1 mg total) by mouth 2 (two) times daily.  60 tablet  3  . fenofibrate 54 MG tablet TAKE 1 TABLET DAILY  30 tablet  5  . fish oil-omega-3 fatty acids 1000 MG capsule Take 1 g by mouth daily.      . furosemide (LASIX) 40 MG tablet TAKE 1 TABLET ONCE A DAY  30 tablet  1  . insulin NPH-regular (NOVOLIN 70/30) (70-30) 100 UNIT/ML injection Inject 25 Units into the skin 2 (two) times daily.      . iron polysaccharides (NIFEREX) 150 MG capsule Take 1 capsule (150 mg total) by mouth 2 (two) times daily.  60 capsule  2  . isosorbide mononitrate (IMDUR) 120 MG 24 hr tablet TAKE 1 TABLET ONCE A DAY  30 tablet  1  . labetalol (NORMODYNE) 200 MG tablet Take 200  mg by mouth 2 (two) times daily.       . nitroGLYCERIN (NITROSTAT) 0.4 MG SL tablet Place 1 tablet (0.4 mg total) under the tongue every 5 (five) minutes x 3 doses as needed for chest pain.  30 tablet  0  . NOVOLIN 70/30 (70-30) 100 UNIT/ML injection INJECT 25 UNITS SQ EVERY MORNING AND EVENING  20 mL  1   No current facility-administered medications on file prior to visit.   Allergies  Allergen Reactions  . Propoxyphene-Acetaminophen   . Simvastatin Other (See Comments)    headache   Immunization History  Administered Date(s) Administered  . Influenza-Generic 08/28/2012  . Pneumococcal-Generic 08/28/2010   Prior to Admission medications   Medication Sig Start Date End Date Taking? Authorizing Provider  amLODipine (NORVASC) 10 MG tablet TAKE 1 TABLET DAILY 06/21/13  Yes Rollene Rotunda, MD  aspirin EC 81 MG tablet Take 81 mg by mouth daily.   Yes Historical Provider, MD  atorvastatin (LIPITOR) 40  MG tablet TAKE 1 TABLET DAILY 07/18/13  Yes Ileana Ladd, MD  cloNIDine (CATAPRES) 0.1 MG tablet Take 1 tablet (0.1 mg total) by mouth 2 (two) times daily. 08/09/13  Yes Ileana Ladd, MD  fenofibrate 54 MG tablet TAKE 1 TABLET DAILY 07/04/13  Yes Ileana Ladd, MD  fish oil-omega-3 fatty acids 1000 MG capsule Take 1 g by mouth daily.   Yes Historical Provider, MD  furosemide (LASIX) 40 MG tablet TAKE 1 TABLET ONCE A DAY 08/01/13  Yes Rollene Rotunda, MD  insulin NPH-regular (NOVOLIN 70/30) (70-30) 100 UNIT/ML injection Inject 25 Units into the skin 2 (two) times daily.   Yes Historical Provider, MD  iron polysaccharides (NIFEREX) 150 MG capsule Take 1 capsule (150 mg total) by mouth 2 (two) times daily. 06/03/13  Yes Ileana Ladd, MD  isosorbide mononitrate (IMDUR) 120 MG 24 hr tablet TAKE 1 TABLET ONCE A DAY 08/01/13  Yes Rollene Rotunda, MD  labetalol (NORMODYNE) 200 MG tablet Take 200 mg by mouth 2 (two) times daily.  12/06/12  Yes Rollene Rotunda, MD  nitroGLYCERIN (NITROSTAT) 0.4 MG SL tablet Place 1 tablet (0.4 mg total) under the tongue every 5 (five) minutes x 3 doses as needed for chest pain. 10/31/12  Yes Nimish Normajean Glasgow, MD  NOVOLIN 70/30 (70-30) 100 UNIT/ML injection INJECT 25 UNITS SQ EVERY MORNING AND EVENING 07/24/13  Yes Ernestina Penna, MD     ROS: As above in the HPI. All other systems are stable or negative.  OBJECTIVE: APPEARANCE:  Patient in no acute distress.The patient appeared well nourished and normally developed. Acyanotic. Waist: VITAL SIGNS:BP 140/51  Pulse 66  Temp(Src) 98.1 F (36.7 C) (Oral)  Wt 161 lb 6.4 oz (73.211 kg)  BMI 27.69 kg/m2  Elderly WF  SKIN: warm and  Dry without overt rashes, tattoos and scars  HEAD and Neck: without JVD, Head and scalp: normal Eyes:No scleral icterus. Fundi normal, eye movements normal. Ears: Auricle normal, canal normal, Tympanic membranes normal, insufflation normal. Nose: normal Throat: normal Neck & thyroid:  normal  CHEST & LUNGS: Chest wall: normal Lungs: Clear  CVS: Reveals the PMI to be normally located. Regular rhythm, First and Second Heart sounds are normal,  absence of murmurs, rubs or gallops. Peripheral vasculature: Radial pulses: normal Dorsal pedis pulses: normal Posterior pulses: normal  ABDOMEN:  Appearance: normal Benign, no organomegaly, no masses, no Abdominal Aortic enlargement. No Guarding , no rebound. No Bruits. Bowel sounds: normal  RECTAL: N/A GU: N/A  EXTREMETIES: trace  Edema around right ankle.  NEUROLOGIC: oriented to time,place and person; nonfocal.  Results for orders placed in visit on 06/21/13  COMPLETE METABOLIC PANEL WITH GFR      Result Value Range   Sodium 139  135 - 145 mEq/L   Potassium 4.3  3.5 - 5.3 mEq/L   Chloride 102  96 - 112 mEq/L   CO2 26  19 - 32 mEq/L   Glucose, Bld 206 (*) 70 - 99 mg/dL   BUN 34 (*) 6 - 23 mg/dL   Creat 1.61 (*) 0.96 - 1.10 mg/dL   Total Bilirubin 0.5  0.3 - 1.2 mg/dL   Alkaline Phosphatase 36 (*) 39 - 117 U/L   AST 12  0 - 37 U/L   ALT 12  0 - 35 U/L   Total Protein 6.7  6.0 - 8.3 g/dL   Albumin 4.2  3.5 - 5.2 g/dL   Calcium 04.5  8.4 - 40.9 mg/dL   GFR, Est African American 17 (*)    GFR, Est Non African American 15 (*)   MAGNESIUM      Result Value Range   Magnesium 2.0  1.5 - 2.5 mg/dL  THYROID PANEL WITH TSH      Result Value Range   T4, Total 8.2  5.0 - 12.5 ug/dL   T3 Uptake 81.1  91.4 - 37.0 %   Free Thyroxine Index 2.3  1.0 - 3.9   TSH 2.700  0.350 - 4.500 uIU/mL  NMR LIPOPROFILE WITH LIPIDS      Result Value Range   LDL Particle Number [id number] (*) <1000 nmol/L   LDL (calc) 87  <100 mg/dL   HDL-C 42  >=78 mg/dL   Triglycerides 295 (*) <150 mg/dL   Cholesterol, Total 621  <200 mg/dL   HDL Particle Number 30.8  >=65.7 umol/L   Large HDL-P <1.3 (*) >=4.8 umol/L   Large VLDL-P 1.0  <=2.7 nmol/L   Small LDL Particle Number [id number]  <=527 nmol/L   LDL Size 21.4  >20.5 nm   HDL Size 8.3 (*)  >=9.2 nm   VLDL Size 47.0 (*) <=46.6 nm   LP-IR Score 50 (*) <=45  POCT CBC      Result Value Range   WBC 6.5  4.6 - 10.2 K/uL   Lymph, poc 0.9  0.6 - 3.4   POC LYMPH PERCENT 14.3  10 - 50 %L   MID (cbc)    0 - 0.9   POC MID %    0 - 12 %M   POC Granulocyte 5.1  2 - 6.9   Granulocyte percent 78.3  37 - 80 %G   RBC 3.2 (*) 4.04 - 5.48 M/uL   Hemoglobin 9.4 (*) 12.2 - 16.2 g/dL   HCT, POC 84.6 (*) 96.2 - 47.9 %   MCV 86.9  80 - 97 fL   MCH, POC 29.1  27 - 31.2 pg   MCHC 33.5  31.8 - 35.4 g/dL   RDW, POC 95.2     Platelet Count, POC 267.0  142 - 424 K/uL   MPV 6.9  0 - 99.8 fL     ASSESSMENT: HTN (hypertension)  Arteriosclerotic cardiovascular disease (ASCVD)  Cerebrovascular disease  Chronic anemia  CKD (chronic kidney disease) stage 3, GFR 30-59 ml/min  DIASTOLIC HEART FAILURE, CHRONIC  DM (diabetes mellitus)  CARDIAC MURMUR, SYSTOLIC  DJD (degenerative joint disease) of lumbar spine  Varicose veins of lower extremities with other complications  Swelling of foot joint, unspecified laterality  Osteoarthritis  BP okay for age at present.  PLAN:  Meds ordered this encounter  Medications  . amLODipine (NORVASC) 10 MG tablet    Sig: TAKE 1 TABLET DAILY    Dispense:  30 tablet    Refill:  5  . fenofibrate 54 MG tablet    Sig: TAKE 1 TABLET DAILY    Dispense:  30 tablet    Refill:  5  . labetalol (NORMODYNE) 200 MG tablet    Sig: Take 1 tablet (200 mg total) by mouth 2 (two) times daily.    Dispense:  60 tablet    Refill:  5  . atorvastatin (LIPITOR) 40 MG tablet    Sig: TAKE 1 TABLET DAILY    Dispense:  90 tablet    Refill:  3  . furosemide (LASIX) 40 MG tablet    Sig: TAKE 1 TABLET ONCE A DAY    Dispense:  30 tablet    Refill:  5  . isosorbide mononitrate (IMDUR) 120 MG 24 hr tablet    Sig: TAKE 1 TABLET ONCE A DAY    Dispense:  30 tablet    Refill:  5    Keep active.  Return in about 4 weeks (around 09/26/2013) for Recheck medical  problems.  Tatijana Bierly P. Modesto Charon, M.D.

## 2013-09-04 ENCOUNTER — Ambulatory Visit: Payer: Medicare Other | Admitting: Cardiology

## 2013-09-30 ENCOUNTER — Encounter (INDEPENDENT_AMBULATORY_CARE_PROVIDER_SITE_OTHER): Payer: Self-pay

## 2013-09-30 ENCOUNTER — Encounter: Payer: Self-pay | Admitting: Family Medicine

## 2013-09-30 ENCOUNTER — Ambulatory Visit (INDEPENDENT_AMBULATORY_CARE_PROVIDER_SITE_OTHER): Payer: Medicare Other | Admitting: Family Medicine

## 2013-09-30 VITALS — BP 165/62 | HR 65 | Temp 98.3°F | Ht 64.0 in | Wt 165.4 lb

## 2013-09-30 DIAGNOSIS — E119 Type 2 diabetes mellitus without complications: Secondary | ICD-10-CM

## 2013-09-30 DIAGNOSIS — N183 Chronic kidney disease, stage 3 unspecified: Secondary | ICD-10-CM

## 2013-09-30 DIAGNOSIS — I2510 Atherosclerotic heart disease of native coronary artery without angina pectoris: Secondary | ICD-10-CM

## 2013-09-30 DIAGNOSIS — M1990 Unspecified osteoarthritis, unspecified site: Secondary | ICD-10-CM

## 2013-09-30 DIAGNOSIS — D649 Anemia, unspecified: Secondary | ICD-10-CM

## 2013-09-30 DIAGNOSIS — I7090 Unspecified atherosclerosis: Secondary | ICD-10-CM

## 2013-09-30 DIAGNOSIS — I10 Essential (primary) hypertension: Secondary | ICD-10-CM

## 2013-09-30 DIAGNOSIS — I5032 Chronic diastolic (congestive) heart failure: Secondary | ICD-10-CM

## 2013-09-30 DIAGNOSIS — Z23 Encounter for immunization: Secondary | ICD-10-CM

## 2013-09-30 DIAGNOSIS — I679 Cerebrovascular disease, unspecified: Secondary | ICD-10-CM

## 2013-09-30 LAB — POCT GLYCOSYLATED HEMOGLOBIN (HGB A1C): Hemoglobin A1C: 6.2

## 2013-09-30 MED ORDER — CLONIDINE HCL 0.1 MG PO TABS: 0.1000 mg | ORAL_TABLET | Freq: Three times a day (TID) | ORAL | Status: DC

## 2013-09-30 NOTE — Progress Notes (Signed)
SUBJECTIVE: CC: Chief Complaint  Patient presents with  . Follow-up    HTN HAS NOT HAD MORNING MEDS  BS THIS AM  PER PT 81     HPI: Patient is here for follow up of hypertension: denies Headache;deniesChest Pain;denies weakness;denies Shortness of Breath or Orthopnea;denies Visual changes;denies palpitations;denies cough;denies pedal edema;denies symptoms of TIA or stroke; admits to Compliance with medications. denies Problems with medications. BPs run 150s  Systolic at home.  Patient is here for follow up of Diabetes Mellitus: Symptoms evaluated: Denies Nocturia ,Denies Urinary Frequency , denies Blurred vision ,deniesDizziness,denies.Dysuria,denies paresthesias, denies extremity pain or ulcers.Marland Kitchendenies chest pain. has had an annual eye exam. do check the feet. Does check CBGs. Average CBG:90-125 Denies episodes of hypoglycemia. Does have an emergency hypoglycemic plan. admits toCompliance with medications. Denies Problems with medications.   Past Medical History  Diagnosis Date  . CAD (coronary artery disease) 2003    Last catheterization 2008. Two-vessel PCI of the first OM branch and mid LAD.  Marland Kitchen CVA (cerebral vascular accident) 1996    LEFT SIDE   . IDDM (insulin dependent diabetes mellitus)   . CKD (chronic kidney disease)   . Myocardial infarction   . Congestive heart failure   . Hypercholesteremia   . Hypertension   . Dementia   . DJD (degenerative joint disease) of lumbar spine    Past Surgical History  Procedure Laterality Date  . Appendectomy    . Knee surgery     History   Social History  . Marital Status: Widowed    Spouse Name: N/A    Number of Children: N/A  . Years of Education: N/A   Occupational History  . Not on file.   Social History Main Topics  . Smoking status: Never Smoker   . Smokeless tobacco: Not on file  . Alcohol Use: No  . Drug Use: No  . Sexual Activity: No   Other Topics Concern  . Not on file   Social History Narrative    Lives alone, but has a 76 year old grand daughter staying with her right now.   Family History  Problem Relation Age of Onset  . Diabetes Brother     RETINOPATHY   . Drug abuse Brother   . Liver cancer Brother   . Coronary artery disease Mother   . Breast cancer Daughter   . Heart attack Mother   . Heart attack Father   . Coronary artery disease Father   . Diabetes Sister    Current Outpatient Prescriptions on File Prior to Visit  Medication Sig Dispense Refill  . amLODipine (NORVASC) 10 MG tablet TAKE 1 TABLET DAILY  30 tablet  5  . aspirin EC 81 MG tablet Take 81 mg by mouth daily.      Marland Kitchen atorvastatin (LIPITOR) 40 MG tablet TAKE 1 TABLET DAILY  90 tablet  3  . fenofibrate 54 MG tablet TAKE 1 TABLET DAILY  30 tablet  5  . FERREX 150 150 MG capsule TAKE  (1)  CAPSULE  TWICE DAILY.  60 capsule  5  . fish oil-omega-3 fatty acids 1000 MG capsule Take 1 g by mouth daily.      . furosemide (LASIX) 40 MG tablet TAKE 1 TABLET ONCE A DAY  30 tablet  5  . insulin NPH-regular (NOVOLIN 70/30) (70-30) 100 UNIT/ML injection Inject 25 Units into the skin 2 (two) times daily.      . isosorbide mononitrate (IMDUR) 120 MG 24 hr tablet TAKE  1 TABLET ONCE A DAY  30 tablet  5  . labetalol (NORMODYNE) 200 MG tablet Take 1 tablet (200 mg total) by mouth 2 (two) times daily.  60 tablet  5  . nitroGLYCERIN (NITROSTAT) 0.4 MG SL tablet Place 1 tablet (0.4 mg total) under the tongue every 5 (five) minutes x 3 doses as needed for chest pain.  30 tablet  0  . NOVOLIN 70/30 (70-30) 100 UNIT/ML injection INJECT 25 UNITS SQ EVERY MORNING AND EVENING  20 mL  1   No current facility-administered medications on file prior to visit.   Allergies  Allergen Reactions  . Propoxyphene-Acetaminophen   . Simvastatin Other (See Comments)    headache   Immunization History  Administered Date(s) Administered  . Influenza-Generic 08/28/2012  . Pneumococcal-Generic 08/28/2010   Prior to Admission medications    Medication Sig Start Date End Date Taking? Authorizing Provider  amLODipine (NORVASC) 10 MG tablet TAKE 1 TABLET DAILY 08/29/13   Ileana Ladd, MD  aspirin EC 81 MG tablet Take 81 mg by mouth daily.    Historical Provider, MD  atorvastatin (LIPITOR) 40 MG tablet TAKE 1 TABLET DAILY 08/29/13   Ileana Ladd, MD  cloNIDine (CATAPRES) 0.1 MG tablet Take 1 tablet (0.1 mg total) by mouth 2 (two) times daily. 08/09/13   Ileana Ladd, MD  fenofibrate 54 MG tablet TAKE 1 TABLET DAILY 08/29/13   Ileana Ladd, MD  FERREX 150 150 MG capsule TAKE  (1)  CAPSULE  TWICE DAILY. 08/28/13   Ileana Ladd, MD  fish oil-omega-3 fatty acids 1000 MG capsule Take 1 g by mouth daily.    Historical Provider, MD  furosemide (LASIX) 40 MG tablet TAKE 1 TABLET ONCE A DAY 08/29/13   Ileana Ladd, MD  insulin NPH-regular (NOVOLIN 70/30) (70-30) 100 UNIT/ML injection Inject 25 Units into the skin 2 (two) times daily.    Historical Provider, MD  isosorbide mononitrate (IMDUR) 120 MG 24 hr tablet TAKE 1 TABLET ONCE A DAY 08/29/13   Ileana Ladd, MD  labetalol (NORMODYNE) 200 MG tablet Take 1 tablet (200 mg total) by mouth 2 (two) times daily. 08/29/13   Ileana Ladd, MD  nitroGLYCERIN (NITROSTAT) 0.4 MG SL tablet Place 1 tablet (0.4 mg total) under the tongue every 5 (five) minutes x 3 doses as needed for chest pain. 10/31/12   Wilson Singer, MD  NOVOLIN 70/30 (70-30) 100 UNIT/ML injection INJECT 25 UNITS SQ EVERY MORNING AND EVENING 07/24/13   Ernestina Penna, MD     ROS: As above in the HPI. All other systems are stable or negative.  OBJECTIVE: APPEARANCE:  Patient in no acute distress.The patient appeared well nourished and normally developed. Acyanotic. Waist: VITAL SIGNS:BP 165/62  Pulse 65  Temp(Src) 98.3 F (36.8 C) (Oral)  Ht 5\' 4"  (1.626 m)  Wt 165 lb 6.4 oz (75.025 kg)  BMI 28.38 kg/m2 Elderly WF  SKIN: warm and  Dry without overt rashes, tattoos and scars  HEAD and Neck: without JVD, Head and  scalp: normal Eyes:No scleral icterus. Fundi normal, eye movements normal. Ears: Auricle normal, canal normal, Tympanic membranes normal, insufflation normal. Nose: normal Throat: normal Neck & thyroid: normal  CHEST & LUNGS: Chest wall: normal Lungs: Clear  CVS: Reveals the PMI to be normally located. Regular rhythm, First and Second Heart sounds are normal,  absence of murmurs, rubs or gallops. Peripheral vasculature: Radial pulses: normal Dorsal pedis pulses: diminished Posterior pulses:diminished  ABDOMEN:  Appearance: normal Benign, no organomegaly, no masses, no Abdominal Aortic enlargement. No Guarding , no rebound. No Bruits. Bowel sounds: normal  RECTAL: N/A GU: N/A  EXTREMETIES: nonedematous.  MUSCULOSKELETAL:  Spine: normal Joints: 5th left tarso metatarsal joint hypertrophied. Right ankle  Soft tissue  swelling  NEUROLOGIC: oriented to time,place and person; nonfocal. Strength is normal Sensory is normal . Foot exam intact except for diminished but palpable pulses Reflexes are normal Cranial Nerves are normal.  ASSESSMENT: BP not at goal even for age. Patient is doing much better but will increase the clonidine to improve the BP.  HTN (hypertension) - Plan: CMP14+EGFR, cloNIDine (CATAPRES) 0.1 MG tablet  Arteriosclerotic cardiovascular disease (ASCVD) - Plan: CMP14+EGFR, Lipid panel  Cerebrovascular disease  Chronic anemia  CKD (chronic kidney disease) stage 3, GFR 30-59 ml/min  DIASTOLIC HEART FAILURE, CHRONIC  DM (diabetes mellitus) - Plan: POCT glycosylated hemoglobin (Hb A1C), CMP14+EGFR  Osteoarthritis  PLAN:  Orders Placed This Encounter  Procedures  . CMP14+EGFR  . Lipid panel  . POCT glycosylated hemoglobin (Hb A1C)   Meds ordered this encounter  Medications  . cloNIDine (CATAPRES) 0.1 MG tablet    Sig: Take 1 tablet (0.1 mg total) by mouth 3 (three) times daily.    Dispense:  90 tablet    Refill:  4   Medications  Discontinued During This Encounter  Medication Reason  . cloNIDine (CATAPRES) 0.1 MG tablet Reorder       Dr Woodroe Mode Recommendations  For nutrition information, I recommend books:  1).Eat to Live by Dr Monico Hoar. 2).Prevent and Reverse Heart Disease by Dr Suzzette Righter. 3) Dr Katherina Right Book:  Program to Reverse Diabetes  Exercise recommendations are:  If unable to walk, then the patient can exercise in a chair 3 times a day. By flapping arms like a bird gently and raising legs outwards to the front.  If ambulatory, the patient can go for walks for 30 minutes 3 times a week. Then increase the intensity and duration as tolerated.  Goal is to try to attain exercise frequency to 5 times a week.  If applicable: Best to perform resistance exercises (machines or weights) 2 days a week and cardio type exercises 3 days per week.  Handout on hypertension and DM foot care in the AVS  Return in about 6 weeks (around 11/11/2013) for recheck BP.  Jeniffer Culliver P. [place] Charon, M.D.

## 2013-09-30 NOTE — Patient Instructions (Signed)
    Dr Munira Polson's Recommendations  For nutrition information, I recommend books:  1).Eat to Live by Dr Joel Fuhrman. 2).Prevent and Reverse Heart Disease by Dr Caldwell Esselstyn. 3) Dr Neal Barnard's Book:  Program to Reverse Diabetes  Exercise recommendations are:  If unable to walk, then the patient can exercise in a chair 3 times a day. By flapping arms like a bird gently and raising legs outwards to the front.  If ambulatory, the patient can go for walks for 30 minutes 3 times a week. Then increase the intensity and duration as tolerated.  Goal is to try to attain exercise frequency to 5 times a week.  If applicable: Best to perform resistance exercises (machines or weights) 2 days a week and cardio type exercises 3 days per week.   Diabetes and Foot Care Diabetes may cause you to have a poor blood supply (circulation) to your legs and feet. Because of this, the skin may be thinner, break easier, and heal more slowly. You also may have nerve damage in your legs and feet causing decreased feeling. You may not notice minor injuries to your feet that could lead to serious problems or infections. Taking care of your feet is one of the most important things you can do for yourself.  HOME CARE INSTRUCTIONS  Do not go barefoot. Bare feet are easily injured.  Check your feet daily for blisters, cuts, and redness.  Wash your feet with warm water (not hot) and mild soap. Pat your feet and between your toes until completely dry.  Apply a moisturizing lotion that does not contain alcohol or petroleum jelly to the dry skin on your feet and to dry brittle toenails. Do not put it between your toes.  Trim your toenails straight across. Do not dig under them or around the cuticle.  Do not cut corns or calluses, or try to remove them with medicine.  Wear clean cotton socks or stockings every day. Make sure they are not too tight. Do not wear knee high stockings since they may decrease  blood flow to your legs.  Wear leather shoes that fit properly and have enough cushioning. To break in new shoes, wear them just a few hours a day to avoid injuring your feet.  Wear shoes at all times, even in the house.  Do not cross your legs. This may decrease the blood flow to your feet.  If you find a minor scrape, cut, or break in the skin on your feet, keep it and the skin around it clean and dry. These areas may be cleansed with mild soap and water. Do not use peroxide, alcohol, iodine or Merthiolate.  When you remove an adhesive bandage, be sure not to harm the skin around it.  If you have a wound, look at it several times a day to make sure it is healing.  Do not use heating pads or hot water bottles. Burns can occur. If you have lost feeling in your feet or legs, you may not know it is happening until it is too late.  Report any cuts, sores or bruises to your caregiver. Do not wait! SEEK MEDICAL CARE IF:   You have an injury that is not healing or you notice redness, numbness, burning, or tingling.  Your feet always feel cold.  You have pain or cramps in your legs and feet. SEEK IMMEDIATE MEDICAL CARE IF:   There is increasing redness, swelling, or increasing pain in the wound.    There is a red line that goes up your leg.  Pus is coming from a wound.  You develop an unexplained oral temperature above 102 F (38.9 C), or as your caregiver suggests.  You notice a bad smell coming from an ulcer or wound. MAKE SURE YOU:   Understand these instructions.  Will watch your condition.  Will get help right away if you are not doing well or get worse. Document Released: 11/11/2000 Document Revised: 02/06/2012 Document Reviewed: 05/20/2009 [facility] Patient Information 2014 [place], [place].  Hypertension As your heart beats, it forces blood through your arteries. This force is your blood pressure. If the pressure is too high, it is called hypertension (HTN) or high blood  pressure. HTN is dangerous because you may have it and not know it. High blood pressure may mean that your heart has to work harder to pump blood. Your arteries may be narrow or stiff. The extra work puts you at risk for heart disease, stroke, and other problems.  Blood pressure consists of two numbers, a higher number over a lower, 110/72, for example. It is stated as "110 over 72." The ideal is below 120 for the top number (systolic) and under 80 for the bottom (diastolic). Write down your blood pressure today. You should pay close attention to your blood pressure if you have certain conditions such as:  Heart failure.  Prior heart attack.  Diabetes  Chronic kidney disease.  Prior stroke.  Multiple risk factors for heart disease. To see if you have HTN, your blood pressure should be measured while you are seated with your arm held at the level of the heart. It should be measured at least twice. A one-time elevated blood pressure reading (especially in the Emergency Department) does not mean that you need treatment. There may be conditions in which the blood pressure is different between your right and left arms. It is important to see your caregiver soon for a recheck. Most people have essential hypertension which means that there is not a specific cause. This type of high blood pressure may be lowered by changing lifestyle factors such as:  Stress.  Smoking.  Lack of exercise.  Excessive weight.  Drug/tobacco/alcohol use.  Eating less salt. Most people do not have symptoms from high blood pressure until it has caused damage to the body. Effective treatment can often prevent, delay or reduce that damage. TREATMENT  When a cause has been identified, treatment for high blood pressure is directed at the cause. There are a large number of medications to treat HTN. These fall into several categories, and your caregiver will help you select the medicines that are best for you. Medications  may have side effects. You should review side effects with your caregiver. If your blood pressure stays high after you have made lifestyle changes or started on medicines,   Your medication(s) may need to be changed.  Other problems may need to be addressed.  Be certain you understand your prescriptions, and know how and when to take your medicine.  Be sure to follow up with your caregiver within the time frame advised (usually within two weeks) to have your blood pressure rechecked and to review your medications.  If you are taking more than one medicine to lower your blood pressure, make sure you know how and at what times they should be taken. Taking two medicines at the same time can result in blood pressure that is too low. SEEK IMMEDIATE MEDICAL CARE IF:  You develop  a severe headache, blurred or changing vision, or confusion.  You have unusual weakness or numbness, or a faint feeling.  You have severe chest or abdominal pain, vomiting, or breathing problems. MAKE SURE YOU:   Understand these instructions.  Will watch your condition.  Will get help right away if you are not doing well or get worse. Document Released: 11/14/2005 Document Revised: 02/06/2012 Document Reviewed: 07/04/2008 [facility] Patient Information 2014 [place], [place].

## 2013-10-01 LAB — CMP14+EGFR
ALT: 12 IU/L (ref 0–32)
AST: 14 IU/L (ref 0–40)
Albumin/Globulin Ratio: 2.5 (ref 1.1–2.5)
Albumin: 4.8 g/dL (ref 3.5–4.8)
Alkaline Phosphatase: 29 IU/L — ABNORMAL LOW (ref 39–117)
BUN/Creatinine Ratio: 15 (ref 11–26)
BUN: 46 mg/dL — ABNORMAL HIGH (ref 8–27)
CO2: 27 mmol/L (ref 18–29)
Calcium: 10.7 mg/dL — ABNORMAL HIGH (ref 8.6–10.2)
Chloride: 100 mmol/L (ref 97–108)
Creatinine, Ser: 3.14 mg/dL (ref 0.57–1.00)
GFR calc Af Amer: 16 mL/min/{1.73_m2} — ABNORMAL LOW (ref >59)
GFR calc non Af Amer: 13 mL/min/{1.73_m2} — ABNORMAL LOW (ref >59)
Globulin, Total: 1.9 g/dL (ref 1.5–4.5)
Glucose: 139 mg/dL — ABNORMAL HIGH (ref 65–99)
Potassium: 5.1 mmol/L (ref 3.5–5.2)
Sodium: 142 mmol/L (ref 134–144)
Total Bilirubin: 0.4 mg/dL (ref 0.0–1.2)
Total Protein: 6.7 g/dL (ref 6.0–8.5)

## 2013-10-01 LAB — LIPID PANEL
Chol/HDL Ratio: 3.2 ratio units (ref 0.0–4.4)
Cholesterol, Total: 186 mg/dL (ref 100–199)
HDL: 59 mg/dL (ref >39)
LDL Calculated: 98 mg/dL (ref 0–99)
Triglycerides: 147 mg/dL (ref 0–149)
VLDL Cholesterol Cal: 29 mg/dL (ref 5–40)

## 2013-10-01 LAB — SPECIMEN STATUS REPORT

## 2013-10-02 ENCOUNTER — Encounter: Payer: Self-pay | Admitting: Family Medicine

## 2013-10-02 NOTE — Progress Notes (Signed)
Quick Note:  Call Patient Labs abnormal:he kidney is getting worse.  Recommendations: Take the blood pressure medications as prescribed. Come in weekly to check the blood pressure. Change her 6 week appointment to 4 weeks. Will need to get better BP control and improvement of creatinine otherwise she will need to see the kidney specialists.   ______

## 2013-10-03 ENCOUNTER — Encounter: Payer: Self-pay | Admitting: *Deleted

## 2013-10-03 NOTE — Progress Notes (Signed)
Quick Note:  Copy of labs sent to patient ______ 

## 2013-10-04 ENCOUNTER — Telehealth: Payer: Self-pay | Admitting: Family Medicine

## 2013-10-04 NOTE — Telephone Encounter (Signed)
PAtient aware it was 6.2

## 2013-10-05 ENCOUNTER — Other Ambulatory Visit: Payer: Self-pay | Admitting: Family Medicine

## 2013-10-09 ENCOUNTER — Ambulatory Visit (INDEPENDENT_AMBULATORY_CARE_PROVIDER_SITE_OTHER): Payer: Medicare Other | Admitting: Cardiology

## 2013-10-09 ENCOUNTER — Encounter: Payer: Self-pay | Admitting: Cardiology

## 2013-10-09 VITALS — BP 151/67 | HR 64 | Ht 64.0 in | Wt 165.0 lb

## 2013-10-09 DIAGNOSIS — I10 Essential (primary) hypertension: Secondary | ICD-10-CM

## 2013-10-09 DIAGNOSIS — I5032 Chronic diastolic (congestive) heart failure: Secondary | ICD-10-CM

## 2013-10-09 DIAGNOSIS — R079 Chest pain, unspecified: Secondary | ICD-10-CM

## 2013-10-09 DIAGNOSIS — I509 Heart failure, unspecified: Secondary | ICD-10-CM

## 2013-10-09 DIAGNOSIS — I200 Unstable angina: Secondary | ICD-10-CM

## 2013-10-09 DIAGNOSIS — I5031 Acute diastolic (congestive) heart failure: Secondary | ICD-10-CM

## 2013-10-09 NOTE — Patient Instructions (Signed)
The current medical regimen is effective;  continue present plan and medications.  Follow up in 1 year with Dr Hochrein.  You will receive a letter in the mail 2 months before you are due.  Please call us when you receive this letter to schedule your follow up appointment.  

## 2013-10-09 NOTE — Progress Notes (Signed)
HPI The patient presents for follow up of coronary artery disease and HTN.  Since I last saw her she was hospitalized at [facility] with a fall and perianal abscess. Since an episode of chest last year and a stress perfusion study demonstrating inferior and distal anteroseptal ischemia for which she was managed medically she's had no cardiovascular complaints however. She does have renal insufficiency and cardiac catheterization for because of this. She says she fell and did not have syncope. She's now living back on her own. She does have some chronic lower extremity swelling but she's not had any chest pressure, neck or arm discomfort. She has not any palpitations, presyncope or syncope.  She has had no PND or orthopnea.   Allergies  Allergen Reactions  . Propoxyphene-Acetaminophen   . Simvastatin Other (See Comments)    headache    Current Outpatient Prescriptions  Medication Sig Dispense Refill  . amLODipine (NORVASC) 10 MG tablet TAKE 1 TABLET DAILY  30 tablet  5  . aspirin EC 81 MG tablet Take 81 mg by mouth daily.      Marland Kitchen atorvastatin (LIPITOR) 40 MG tablet TAKE 1 TABLET DAILY  90 tablet  3  . fish oil-omega-3 fatty acids 1000 MG capsule Take 1 g by mouth daily.      . cloNIDine (CATAPRES) 0.1 MG tablet Take 1 tablet (0.1 mg total) by mouth 3 (three) times daily.  90 tablet  4  . fenofibrate 54 MG tablet TAKE 1 TABLET DAILY  30 tablet  5  . FERREX 150 150 MG capsule TAKE  (1)  CAPSULE  TWICE DAILY.  60 capsule  5  . furosemide (LASIX) 40 MG tablet TAKE 1 TABLET ONCE A DAY  30 tablet  5  . insulin NPH-regular (NOVOLIN 70/30) (70-30) 100 UNIT/ML injection Inject 25 Units into the skin 2 (two) times daily.      . isosorbide mononitrate (IMDUR) 120 MG 24 hr tablet TAKE 1 TABLET ONCE A DAY  30 tablet  5  . labetalol (NORMODYNE) 200 MG tablet Take 1 tablet (200 mg total) by mouth 2 (two) times daily.  60 tablet  5  . nitroGLYCERIN (NITROSTAT) 0.4 MG SL tablet Place 1 tablet (0.4 mg total) under  the tongue every 5 (five) minutes x 3 doses as needed for chest pain.  30 tablet  0  . NOVOLIN 70/30 (70-30) 100 UNIT/ML injection INJECT 25 UNITS TWICE A DAY EVERY MORNING AND EVENING  20 mL  2   No current facility-administered medications for this visit.    Past Medical History  Diagnosis Date  . CAD (coronary artery disease) 2003    Last catheterization 2008. Two-vessel PCI of the first OM branch and mid LAD.  Marland Kitchen CVA (cerebral vascular accident) 1996    LEFT SIDE   . IDDM (insulin dependent diabetes mellitus)   . CKD (chronic kidney disease)   . Myocardial infarction   . Congestive heart failure   . Hypercholesteremia   . Hypertension   . Dementia   . DJD (degenerative joint disease) of lumbar spine     Past Surgical History  Procedure Laterality Date  . Appendectomy    . Knee surgery      ROS:  As stated in the HPI and negative for all other systems.  PHYSICAL EXAM BP 151/67  Pulse 64  Ht 5\' 4"  (1.626 m)  Wt 165 lb (74.844 kg)  BMI 28.31 kg/m2 GENERAL:  Well appearing HEENT:  Pupils equal round and  reactive, fundi not visualized, oral mucosa unremarkable, dentures, she has a slight knot on her head without any bruising erythema or bleeding  NECK:  No jugular venous distention, waveform within normal limits, carotid upstroke brisk and symmetric, no bruits, positive bilateral transmitted systolic murmur, no thyromegaly LUNGS:  Clear to auscultation bilaterally BACK:  No CVA tenderness CHEST:  Unremarkable HEART:  PMI not displaced or sustained,S1 and S2 within normal limits, no S3, no S4, no clicks, no rubs, apical systolic murmurs, increases with the strain phase of valsava and radiating out the carotids. ABD:  Flat, positive bowel sounds normal in frequency in pitch, no bruits, no rebound, no guarding, no midline pulsatile mass, no hepatomegaly, no splenomegaly EXT:  2 plus pulses throughout, no cyanosis no , mild left greater than right ankle swelling.  Varicose  veins   EKG:  Sinus rhythm, rate 64, right bundle branch block, left anterior fascicular block, first degree block, no acute ST-T wave changes. 10/09/2013  ASSESSMENT AND PLAN  CAD, NATIVE VESSEL -  Given the fact that the patient's not having any further chest pain and she has renal insufficiency making cath higher risk we will continue to manage her medically.   HYPERTENSION, BENIGN -  Her blood pressure today is slightly elevated but not far above the 150 target for her age.  She will continue these meds as listed.  DIASTOLIC HEART FAILURE, CHRONIC -  She seems to be euvolemic. She will continue the meds as listed.  CKD (chronic kidney disease) -  She has had chronic renal insufficiency. She will continue to have this followed by Rudi Heap, MD  CARDIAC MURMUR, SYSTOLIC -  She has some calcification of her aortic valve on her echo when she was hospitalized in of last year.  She's had no change in her exam or symptoms. No further evaluation is necessary.

## 2013-12-09 ENCOUNTER — Other Ambulatory Visit: Payer: Self-pay

## 2013-12-09 MED ORDER — NITROGLYCERIN 0.4 MG SL SUBL: 0.4000 mg | SUBLINGUAL_TABLET | SUBLINGUAL | Status: DC | PRN

## 2013-12-09 NOTE — Telephone Encounter (Signed)
Last sen 09/30/13  FPW

## 2013-12-09 NOTE — Telephone Encounter (Signed)
Call patient : Prescription refilled & sent to pharmacy in [place]. 

## 2014-01-24 ENCOUNTER — Other Ambulatory Visit: Payer: Self-pay | Admitting: Family Medicine

## 2014-01-28 ENCOUNTER — Ambulatory Visit: Payer: Medicare Other | Admitting: Family Medicine

## 2014-02-21 ENCOUNTER — Other Ambulatory Visit: Payer: Self-pay | Admitting: Family Medicine

## 2014-03-14 ENCOUNTER — Other Ambulatory Visit: Payer: Self-pay | Admitting: Family Medicine

## 2014-03-17 ENCOUNTER — Telehealth: Payer: Self-pay | Admitting: Family Medicine

## 2014-03-17 ENCOUNTER — Ambulatory Visit (INDEPENDENT_AMBULATORY_CARE_PROVIDER_SITE_OTHER): Payer: Medicare Other

## 2014-03-17 ENCOUNTER — Encounter: Payer: Self-pay | Admitting: Family Medicine

## 2014-03-17 ENCOUNTER — Ambulatory Visit (INDEPENDENT_AMBULATORY_CARE_PROVIDER_SITE_OTHER): Payer: Medicare Other | Admitting: Family Medicine

## 2014-03-17 VITALS — BP 139/56 | HR 60 | Temp 98.2°F | Ht 64.0 in | Wt 163.0 lb

## 2014-03-17 DIAGNOSIS — M79609 Pain in unspecified limb: Secondary | ICD-10-CM

## 2014-03-17 DIAGNOSIS — S62639A Displaced fracture of distal phalanx of unspecified finger, initial encounter for closed fracture: Secondary | ICD-10-CM | POA: Insufficient documentation

## 2014-03-17 DIAGNOSIS — W19XXXA Unspecified fall, initial encounter: Secondary | ICD-10-CM

## 2014-03-17 NOTE — Telephone Encounter (Signed)
Call patient : Prescription refilled & sent to pharmacy in [place]. 

## 2014-03-17 NOTE — Telephone Encounter (Signed)
Last visit for hypertension 10/14.

## 2014-03-17 NOTE — Patient Instructions (Signed)
Flexor Digitorum Profundus Rupture ([place] Finger) Flexor digitorum profundus rupture, commonly called [place] finger, is a condition where a person is unable to bend his or her own finger, without assistance. This is caused by injury to the tendon that bends the last joint of the finger. The tendon tears (ruptures), and sometimes pulls a piece of bone off with it. This is called an avulsion fracture. The injury is called [place] finger, because it often occurs when a player grabs another player's [place] or pants. SYMPTOMS   A "pop" or rip felt in the finger, at the time of injury.  Pain when moving the finger.  Inability to bend the finger, without assistance.  Full passive motion of the finger (can be bent with help).  Tenderness, swelling, and warmth of the injured finger.  Bruising after 48 hours.  Sometimes, a lump felt in the palm of the hand. CAUSES  The most common cause is forced straightening (extension) of a bent (flexed) finger. The force on the tendon is greater than it can withstand, causing it to rupture. [place] finger is less commonly the result of a cut (laceration). RISK INCREASES WITH:  Sports that involve grasping an object, such as a [place] (rugby, football (during tackling), and ice hockey when gloves are removed and a player grabs another player's [place]).  Poor hand strength and flexibility.  Previous finger injury. PREVENTION   Warm up and stretch properly before activity.  Maintain physical fitness:  Hand and finger flexibility.  Muscle strength and endurance.  Taping, splinting, or protective strapping may be advised before activity.  Learn and use proper tackling technique. PROGNOSIS  [place] finger often requires surgery, followed by a 3 month recovery. RELATED COMPLICATIONS   Permanent deformity (inability to bend finger).  Stiffness of finger.  If untreated, unstable last joint.  Poor finger function.  Repeated rupture of the  tendon.  Pain or weakness with gripping.  Prolonged disability.  Arthritis of the finger, especially if associated with a fracture.  Risks of surgery: infection, injury to nerves (numbness, weakness), bleeding, weakness, recurring tendon injury, finger stiffness. TREATMENT  [place] finger usually requires surgery. Before surgery, treatment involves restraint and ice and medicine, to reduce pain and inflammation. Surgery will involve reattaching the tendon to the bone. After surgery, the hand is restrained for protection during the healing process. Stretching and strengthening exercises will be needed, for you to regain strength and a full range of motion. Exercises may be completed at home or with a therapist. MEDICATION   If pain medicine is needed, nonsteroidal anti-inflammatory medicines (aspirin and ibuprofen), or other minor pain relievers (acetaminophen), are often advised.  Do not take pain medicine for 7 days before surgery.  Prescription pain relievers may be given. Use only as directed and only as much as you need. SEEK MEDICAL CARE IF:   Pain increases, despite treatment.  Any of the following occur after surgery:  You experience pain, numbness, or coldness in the finger.  Blue, gray, or dark color appears in the fingernails.  You develop signs of infection: fever, increased pain, swelling, redness, drainage of fluids, or bleeding in the affected area.  New, unexplained symptoms develop. (Drugs used in treatment may produce side effects.) Document Released: 11/14/2005 Document Revised: 02/06/2012 Document Reviewed: 02/26/2009 [facility] Patient Information 2014 [place], [place].

## 2014-03-17 NOTE — Telephone Encounter (Signed)
appt at 9:40 for fall

## 2014-03-17 NOTE — Progress Notes (Signed)
Patient ID: [name], female   DOB: 10-29-1932, 80 y.o.   MRN: [id number] SUBJECTIVE: CC: Chief Complaint  Patient presents with  . Acute Visit    FELL IN CHURCH YEST C/O LEFT RING FINGER HURTING    HPI: [place] yesterday. S/p CVA with right leg weakness. Has a walker and did not want to use it and she is unstable without the walker. Was [place] the [place]. Right leg buckled and she fell and stubbed the left ring finger and the DIP is in flexion and she cannot straighten it. She can bend it.   Past Medical History  Diagnosis Date  . CAD (coronary artery disease) 2003    Last catheterization 2008. Two-vessel PCI of the first OM branch and mid LAD.  Marland Kitchen CVA (cerebral vascular accident) 1996    LEFT SIDE   . IDDM (insulin dependent diabetes mellitus)   . CKD (chronic kidney disease)   . Myocardial infarction   . Congestive heart failure   . Hypercholesteremia   . Hypertension   . Dementia   . DJD (degenerative joint disease) of lumbar spine    Past Surgical History  Procedure Laterality Date  . Appendectomy    . Knee surgery     History   Social History  . Marital Status: Widowed    Spouse Name: N/A    Number of Children: N/A  . Years of Education: N/A   Occupational History  . Not on file.   Social History Main Topics  . Smoking status: Never Smoker   . Smokeless tobacco: Not on file  . Alcohol Use: No  . Drug Use: No  . Sexual Activity: No   Other Topics Concern  . Not on file   Social History Narrative   Lives alone, but has a 5 year old grand daughter staying with her right now.   Family History  Problem Relation Age of Onset  . Diabetes Brother     RETINOPATHY   . Drug abuse Brother   . Liver cancer Brother   . Coronary artery disease Mother   . Breast cancer Daughter   . Heart attack Mother   . Heart attack Father   . Coronary artery disease Father   . Diabetes Sister    Current Outpatient Prescriptions on File Prior to Visit  Medication  Sig Dispense Refill  . amLODipine (NORVASC) 10 MG tablet TAKE 1 TABLET DAILY  30 tablet  5  . aspirin EC 81 MG tablet Take 81 mg by mouth daily.      Marland Kitchen atorvastatin (LIPITOR) 40 MG tablet TAKE 1 TABLET DAILY  90 tablet  3  . cloNIDine (CATAPRES) 0.1 MG tablet Take 0.1 mg by mouth 2 (two) times daily.      . fenofibrate 54 MG tablet TAKE 1 TABLET DAILY  30 tablet  5  . FERREX 150 150 MG capsule TAKE  (1)  CAPSULE  TWICE DAILY.  60 capsule  1  . fish oil-omega-3 fatty acids 1000 MG capsule Take 1 g by mouth daily.      . furosemide (LASIX) 40 MG tablet TAKE 1 TABLET DAILY  30 tablet  2  . insulin NPH-regular (NOVOLIN 70/30) (70-30) 100 UNIT/ML injection Inject 25 Units into the skin 2 (two) times daily.      . isosorbide mononitrate (IMDUR) 120 MG 24 hr tablet TAKE 1 TABLET DAILY  30 tablet  2  . labetalol (NORMODYNE) 200 MG tablet Take 1 tablet (200 mg total)  by mouth 2 (two) times daily.  60 tablet  5  . nitroGLYCERIN (NITROSTAT) 0.4 MG SL tablet Place 1 tablet (0.4 mg total) under the tongue every 5 (five) minutes x 3 doses as needed for chest pain.  30 tablet  0  . NOVOLIN 70/30 (70-30) 100 UNIT/ML injection INJECT 25 UNITS TWICE A DAY EVERY MORNING AND EVENING  20 mL  2   No current facility-administered medications on file prior to visit.   Allergies  Allergen Reactions  . Propoxyphene N-Acetaminophen   . Simvastatin Other (See Comments)    headache   Immunization History  Administered Date(s) Administered  . Influenza,inj,Quad PF,36+ Mos 09/30/2013  . Influenza-Unspecified 08/28/2012  . Pneumococcal-Unspecified 08/28/2010   Prior to Admission medications   Medication Sig Start Date End Date Taking? Authorizing Provider  amLODipine (NORVASC) 10 MG tablet TAKE 1 TABLET DAILY 08/29/13   [name], MD  aspirin EC 81 MG tablet Take 81 mg by mouth daily.    Historical Provider, MD  atorvastatin (LIPITOR) 40 MG tablet TAKE 1 TABLET DAILY 08/29/13   [name], MD  cloNIDine  (CATAPRES) 0.1 MG tablet Take 0.1 mg by mouth 2 (two) times daily. 09/30/13   [name], MD  fenofibrate 54 MG tablet TAKE 1 TABLET DAILY 08/29/13   [name], MD  FERREX 150 150 MG capsule TAKE  (1)  CAPSULE  TWICE DAILY.    [name], MD  fish oil-omega-3 fatty acids 1000 MG capsule Take 1 g by mouth daily.    Historical Provider, MD  furosemide (LASIX) 40 MG tablet TAKE 1 TABLET DAILY    [name], MD  insulin NPH-regular (NOVOLIN 70/30) (70-30) 100 UNIT/ML injection Inject 25 Units into the skin 2 (two) times daily.    Historical Provider, MD  isosorbide mononitrate (IMDUR) 120 MG 24 hr tablet TAKE 1 TABLET DAILY    [name], MD  labetalol (NORMODYNE) 200 MG tablet Take 1 tablet (200 mg total) by mouth 2 (two) times daily. 08/29/13   [name], MD  nitroGLYCERIN (NITROSTAT) 0.4 MG SL tablet Place 1 tablet (0.4 mg total) under the tongue every 5 (five) minutes x 3 doses as needed for chest pain. 12/09/13   [name], MD  NOVOLIN 70/30 (70-30) 100 UNIT/ML injection INJECT 25 UNITS TWICE A DAY EVERY MORNING AND EVENING 10/05/13   [name], MD     ROS: As above in the HPI. All other systems are stable or negative.  OBJECTIVE: APPEARANCE:  Patient in no acute distress.The patient appeared well nourished and normally developed. Acyanotic. Waist: VITAL SIGNS:BP 139/56  Pulse 60  Temp(Src) 98.2 F (36.8 C) (Oral)  Ht 5\' 4"  (1.626 m)  Wt 163 lb (73.936 kg)  BMI 27.97 kg/m2   SKIN: warm and  Dry without overt rashes, tattoos and scars  HEAD and Neck: without JVD, Head and scalp: normal Eyes:No scleral icterus. Fundi normal, eye movements normal. Ears: Auricle normal, canal normal, Tympanic membranes normal, insufflation normal. Nose: normal Throat: normal Neck & thyroid: normal  CHEST & LUNGS: Chest wall: normal Lungs: Clear  CVS: Reveals the PMI to be normally located. Regular rhythm, First and Second Heart sounds are normal,  absence of  murmurs, rubs or gallops. Peripheral vasculature: Radial pulses: normal  ABDOMEN:  Appearance: overweight Benign, no organomegaly, no masses, no Abdominal Aortic enlargement. No Guarding , no rebound. No Bruits. Bowel sounds: normal  RECTAL: N/A GU: N/A  MUSCULOSKELETAL:  Left ring finger in flexion at 30 degrees. at the DIP joint.with swelling.unable to extend DIP.  NEUROLOGIC: oriented to time,place and person; nonfocal. Strength is  4.5/5 in the right lower extremity. Cranial Nerves are normal.  ASSESSMENT: Fall - Plan: DG Finger Ring Left  Avulsion fracture of distal phalanx of finger - Plan: Ambulatory referral to Hand Surgery  PLAN: Fall prevention. Advised not to be so resistant to using her walker. She needs to use it for ambulation at all times.patient advised.   Orders Placed This Encounter  Procedures  . DG Finger Ring Left    Standing Status: Future     Number of Occurrences: 1     Standing Expiration Date: 05/17/2015    Order Specific Question:  Reason for Exam (SYMPTOM  OR DIAGNOSIS REQUIRED)    Answer:  fall    Order Specific Question:  Preferred imaging location?    Answer:  Internal  . Ambulatory referral to Hand Surgery    Referral Priority:  Routine    Referral Type:  Surgical    Referral Reason:  Specialty Services Required    Requested Specialty:  Hand Surgery    Number of Visits Requested:  1  [facility] reading (PRIMARY) by  Dr. Jacelyn Grip: avulsion fracture on dorsal aspect finger in flexion at the DIP.  Splinted in extension with a finger splint. Referral to hand orthopedics.                                  No orders of the defined types were placed in this encounter.   There are no discontinued medications. Return if symptoms worsen or fail to improve, for referred to hand orthopedist. keep regular follow up.  Audley Hinojos P. Jacelyn Grip, M.D.

## 2014-03-21 ENCOUNTER — Encounter: Payer: Self-pay | Admitting: Family Medicine

## 2014-03-21 ENCOUNTER — Ambulatory Visit (INDEPENDENT_AMBULATORY_CARE_PROVIDER_SITE_OTHER): Payer: Medicare Other | Admitting: Family Medicine

## 2014-03-21 VITALS — BP 137/55 | HR 62 | Temp 97.6°F | Ht 64.0 in | Wt 163.4 lb

## 2014-03-21 DIAGNOSIS — M1990 Unspecified osteoarthritis, unspecified site: Secondary | ICD-10-CM

## 2014-03-21 DIAGNOSIS — M6282 Rhabdomyolysis: Secondary | ICD-10-CM | POA: Insufficient documentation

## 2014-03-21 DIAGNOSIS — I200 Unstable angina: Secondary | ICD-10-CM

## 2014-03-21 DIAGNOSIS — H8110 Benign paroxysmal vertigo, unspecified ear: Secondary | ICD-10-CM | POA: Insufficient documentation

## 2014-03-21 DIAGNOSIS — I7090 Unspecified atherosclerosis: Secondary | ICD-10-CM

## 2014-03-21 DIAGNOSIS — N184 Chronic kidney disease, stage 4 (severe): Secondary | ICD-10-CM

## 2014-03-21 DIAGNOSIS — I10 Essential (primary) hypertension: Secondary | ICD-10-CM

## 2014-03-21 DIAGNOSIS — I679 Cerebrovascular disease, unspecified: Secondary | ICD-10-CM

## 2014-03-21 DIAGNOSIS — I5032 Chronic diastolic (congestive) heart failure: Secondary | ICD-10-CM

## 2014-03-21 DIAGNOSIS — E119 Type 2 diabetes mellitus without complications: Secondary | ICD-10-CM

## 2014-03-21 DIAGNOSIS — I2510 Atherosclerotic heart disease of native coronary artery without angina pectoris: Secondary | ICD-10-CM

## 2014-03-21 DIAGNOSIS — R011 Cardiac murmur, unspecified: Secondary | ICD-10-CM

## 2014-03-21 DIAGNOSIS — D649 Anemia, unspecified: Secondary | ICD-10-CM

## 2014-03-21 LAB — POCT UA - MICROSCOPIC ONLY
Bacteria, U Microscopic: NEGATIVE
Casts, Ur, LPF, POC: NEGATIVE
Crystals, Ur, HPF, POC: NEGATIVE
Yeast, UA: NEGATIVE

## 2014-03-21 LAB — POCT GLYCOSYLATED HEMOGLOBIN (HGB A1C): Hemoglobin A1C: 7

## 2014-03-21 NOTE — Progress Notes (Signed)
Patient ID: [name], female   DOB: 1931-12-18, 80 y.o.   MRN: [id number] SUBJECTIVE: CC: Chief Complaint  Patient presents with  . Follow-up    6 month follow up chronic problems  oriented to date time and place    HPI:  Patient is here for follow up of Diabetes Mellitus/CAD/HTN/CKD/CHF: Symptoms evaluated: Denies Nocturia ,Denies Urinary Frequency , denies Blurred vision ,deniesDizziness,denies.Dysuria,denies paresthesias, denies extremity pain or ulcers.Marland Kitchendenies chest pain. has had an annual eye exam. do check the feet. Does check CBGs. Average BDZ:HGDJMEQAST Denies episodes of hypoglycemia. Does have an emergency hypoglycemic plan. admits toCompliance with medications. Denies Problems with medications.  Did not see the orthopedist today because it was too early in the morning to get to the appointment. She was Dx by me to have a [place] finger.  Past Medical History  Diagnosis Date  . CAD (coronary artery disease) 2003    Last catheterization 2008. Two-vessel PCI of the first OM branch and mid LAD.  Marland Kitchen CVA (cerebral vascular accident) 1996    LEFT SIDE   . IDDM (insulin dependent diabetes mellitus)   . CKD (chronic kidney disease)   . Myocardial infarction   . Congestive heart failure   . Hypercholesteremia   . Hypertension   . Dementia   . DJD (degenerative joint disease) of lumbar spine    Past Surgical History  Procedure Laterality Date  . Appendectomy    . Knee surgery     History   Social History  . Marital Status: Widowed    Spouse Name: N/A    Number of Children: N/A  . Years of Education: N/A   Occupational History  . Not on file.   Social History Main Topics  . Smoking status: Never Smoker   . Smokeless tobacco: Not on file  . Alcohol Use: No  . Drug Use: No  . Sexual Activity: No   Other Topics Concern  . Not on file   Social History Narrative   Lives alone, but has a 52 year old grand daughter staying with her right now.   Family  History  Problem Relation Age of Onset  . Diabetes Brother     RETINOPATHY   . Drug abuse Brother   . Liver cancer Brother   . Coronary artery disease Mother   . Breast cancer Daughter   . Heart attack Mother   . Heart attack Father   . Coronary artery disease Father   . Diabetes Sister    Current Outpatient Prescriptions on File Prior to Visit  Medication Sig Dispense Refill  . amLODipine (NORVASC) 10 MG tablet TAKE 1 TABLET DAILY  90 tablet  0  . aspirin EC 81 MG tablet Take 81 mg by mouth daily.      Marland Kitchen atorvastatin (LIPITOR) 40 MG tablet TAKE 1 TABLET DAILY  90 tablet  3  . cloNIDine (CATAPRES) 0.1 MG tablet Take 0.1 mg by mouth 2 (two) times daily.      . fenofibrate 54 MG tablet TAKE 1 TABLET DAILY  30 tablet  5  . FERREX 150 150 MG capsule TAKE  (1)  CAPSULE  TWICE DAILY.  60 capsule  1  . fish oil-omega-3 fatty acids 1000 MG capsule Take 1 g by mouth daily.      . furosemide (LASIX) 40 MG tablet TAKE 1 TABLET DAILY  30 tablet  2  . isosorbide mononitrate (IMDUR) 120 MG 24 hr tablet TAKE 1 TABLET DAILY  30 tablet  2  . labetalol (NORMODYNE) 200 MG tablet Take 1 tablet (200 mg total) by mouth 2 (two) times daily.  60 tablet  5  . nitroGLYCERIN (NITROSTAT) 0.4 MG SL tablet Place 1 tablet (0.4 mg total) under the tongue every 5 (five) minutes x 3 doses as needed for chest pain.  30 tablet  0  . NOVOLIN 70/30 (70-30) 100 UNIT/ML injection INJECT 25 UNITS TWICE A DAY EVERY MORNING AND EVENING  20 mL  2   No current facility-administered medications on file prior to visit.   Allergies  Allergen Reactions  . Propoxyphene N-Acetaminophen   . Simvastatin Other (See Comments)    headache   Immunization History  Administered Date(s) Administered  . Influenza,inj,Quad PF,36+ Mos 09/30/2013  . Influenza-Unspecified 08/28/2012  . Pneumococcal-Unspecified 08/28/2010   Prior to Admission medications   Medication Sig Start Date End Date Taking? Authorizing Provider  amLODipine  (NORVASC) 10 MG tablet TAKE 1 TABLET DAILY   Yes [name], MD  aspirin EC 81 MG tablet Take 81 mg by mouth daily.   Yes Historical Provider, MD  atorvastatin (LIPITOR) 40 MG tablet TAKE 1 TABLET DAILY 08/29/13  Yes [name], MD  cloNIDine (CATAPRES) 0.1 MG tablet Take 0.1 mg by mouth 2 (two) times daily. 09/30/13  Yes [name], MD  fenofibrate 54 MG tablet TAKE 1 TABLET DAILY 08/29/13  Yes [name], MD  FERREX 150 150 MG capsule TAKE  (1)  CAPSULE  TWICE DAILY.   Yes [name], MD  fish oil-omega-3 fatty acids 1000 MG capsule Take 1 g by mouth daily.   Yes Historical Provider, MD  furosemide (LASIX) 40 MG tablet TAKE 1 TABLET DAILY   Yes [name], MD  isosorbide mononitrate (IMDUR) 120 MG 24 hr tablet TAKE 1 TABLET DAILY   Yes [name], MD  labetalol (NORMODYNE) 200 MG tablet Take 1 tablet (200 mg total) by mouth 2 (two) times daily. 08/29/13  Yes [name], MD  nitroGLYCERIN (NITROSTAT) 0.4 MG SL tablet Place 1 tablet (0.4 mg total) under the tongue every 5 (five) minutes x 3 doses as needed for chest pain. 12/09/13  Yes [name], MD  NOVOLIN 70/30 (70-30) 100 UNIT/ML injection INJECT 25 UNITS TWICE A DAY EVERY MORNING AND EVENING 10/05/13  Yes [name], MD     ROS: As above in the HPI. All other systems are stable or negative.  OBJECTIVE: APPEARANCE:  Patient in no acute distress.The patient appeared well nourished and normally developed. Acyanotic. Waist: VITAL SIGNS:BP 137/55  Pulse 62  Temp(Src) 97.6 F (36.4 C) (Oral)  Ht _0  (1.626 m)  Wt 163 lb 6.4 oz (74.118 kg)  BMI 28.03 kg/m2  WF SKIN: warm and  Dry without overt rashes, tattoos and scars  HEAD and Neck: without JVD, Head and scalp: normal Eyes:No scleral icterus. Fundi normal, eye movements normal. Ears: Auricle normal, canal normal, Tympanic membranes normal, insufflation normal. Nose: normal Throat: normal Neck & thyroid: normal  CHEST & LUNGS: Chest wall:  normal Lungs: Clear  CVS: Reveals the PMI to be normally located. Regular rhythm, First and Second Heart sounds are normal,  absence of murmurs, rubs or gallops. Peripheral vasculature: Radial pulses: normal Dorsal pedis pulses: normal Posterior pulses: normal  ABDOMEN:  Appearance: normal Benign, no organomegaly, no masses, no Abdominal Aortic enlargement. No Guarding , no rebound. No Bruits. Bowel sounds: normal  RECTAL: N/A GU: N/A  EXTREMETIES: 2+  edematous right leg. This is chronic from varicose veins.  MUSCULOSKELETAL:  Spine: normal Joints: left ring finger in the splint.   NEUROLOGIC: oriented to time,place and person;  Right lower extremity 4.5/5 strength from her CVA many yyears ago. Walks with a limp. She has a cane but she left it in the Car.!! Sensory is normal Reflexes are normal Cranial Nerves are normal.  ASSESSMENT:  HTN (hypertension) - Plan: CMP14+EGFR  DM (diabetes mellitus) - Plan: POCT glycosylated hemoglobin (Hb A1C), POCT UA - Microscopic Only  DIASTOLIC HEART FAILURE, CHRONIC  CKD (chronic kidney disease) stage 4, GFR 15-29 ml/min  Chronic anemia  Cerebrovascular disease  Arteriosclerotic cardiovascular disease (ASCVD) - Plan: NMR, lipoprofile  Osteoarthritis  CARDIAC MURMUR, SYSTOLIC  Unstable angina  Benign paroxysmal positional vertigo - recent but resolved  PLAN:  DM footcare in the AVS Fall precaution in the AVS and  discussed. Compliance discussed.  Diet discussed Keeping active encouraged.  Reschedule the appointment for treatment of her [place] finger Orders Placed This Encounter  Procedures  . CMP14+EGFR  . NMR, lipoprofile  . POCT glycosylated hemoglobin (Hb A1C)  . POCT UA - Microscopic Only   No orders of the defined types were placed in this encounter.   Medications Discontinued During This Encounter  Medication Reason  . insulin NPH-regular (NOVOLIN 70/30) (70-30) 100 UNIT/ML injection Duplicate    Return in about 3 months (around 06/20/2014) for Recheck medical problems.  Vika Buske P. Jacelyn Grip, M.D.

## 2014-03-21 NOTE — Patient Instructions (Signed)
Diabetes and Foot Care Diabetes may cause you to have problems because of poor blood supply (circulation) to your feet and legs. This may cause the skin on your feet to become thinner, break easier, and heal more slowly. Your skin may become dry, and the skin may peel and crack. You may also have nerve damage in your legs and feet causing decreased feeling in them. You may not notice minor injuries to your feet that could lead to infections or more serious problems. Taking care of your feet is one of the most important things you can do for yourself.  HOME CARE INSTRUCTIONS  Wear shoes at all times, even in the house. Do not go barefoot. Bare feet are easily injured.  Check your feet daily for blisters, cuts, and redness. If you cannot see the bottom of your feet, use a mirror or ask someone for help.  Wash your feet with warm water (do not use hot water) and mild soap. Then pat your feet and the areas between your toes until they are completely dry. Do not soak your feet as this can dry your skin.  Apply a moisturizing lotion or petroleum jelly (that does not contain alcohol and is unscented) to the skin on your feet and to dry, brittle toenails. Do not apply lotion between your toes.  Trim your toenails straight across. Do not dig under them or around the cuticle. File the edges of your nails with an emery board or nail file.  Do not cut corns or calluses or try to remove them with medicine.  Wear clean socks or stockings every day. Make sure they are not too tight. Do not wear knee-high stockings since they may decrease blood flow to your legs.  Wear shoes that fit properly and have enough cushioning. To break in new shoes, wear them for just a few hours a day. This prevents you from injuring your feet. Always look in your shoes before you put them on to be sure there are no objects inside.  Do not cross your legs. This may decrease the blood flow to your feet.  If you find a minor scrape,  cut, or break in the skin on your feet, keep it and the skin around it clean and dry. These areas may be cleansed with mild soap and water. Do not cleanse the area with peroxide, alcohol, or iodine.  When you remove an adhesive bandage, be sure not to damage the skin around it.  If you have a wound, look at it several times a day to make sure it is healing.  Do not use heating pads or hot water bottles. They may burn your skin. If you have lost feeling in your feet or legs, you may not know it is happening until it is too late.  Make sure your health care provider performs a complete foot exam at least annually or more often if you have foot problems. Report any cuts, sores, or bruises to your health care provider immediately. SEEK MEDICAL CARE IF:   You have an injury that is not healing.  You have cuts or breaks in the skin.  You have an ingrown nail.  You notice redness on your legs or feet.  You feel burning or tingling in your legs or feet.  You have pain or cramps in your legs and feet.  Your legs or feet are numb.  Your feet always feel cold. SEEK IMMEDIATE MEDICAL CARE IF:   There is increasing redness,   swelling, or pain in or around a wound.  There is a red line that goes up your leg.  Pus is coming from a wound.  You develop a fever or as directed by your health care provider.  You notice a bad smell coming from an ulcer or wound. Document Released: 11/11/2000 Document Revised: 07/17/2013 Document Reviewed: 04/23/2013 [facility] Patient Information 2014 [place].    Fall Prevention and Home Safety Falls cause injuries and can affect all age groups. It is possible to use preventive measures to significantly decrease the likelihood of falls. There are many simple measures which can make your home safer and prevent falls. OUTDOORS  Repair cracks and edges of walkways and driveways.  Remove high doorway thresholds.  Trim shrubbery on the main path into  your home.  Have good outside lighting.  Clear walkways of tools, rocks, debris, and clutter.  Check that handrails are not broken and are securely fastened. Both sides of steps should have handrails.  Have leaves, snow, and ice cleared regularly.  Use sand or salt on walkways during winter months.  In the garage, clean up grease or oil spills. BATHROOM  Install night lights.  Install grab bars by the toilet and in the tub and shower.  Use non-skid mats or decals in the tub or shower.  Place a plastic non-slip stool in the shower to sit on, if needed.  Keep floors dry and clean up all water on the floor immediately.  Remove soap buildup in the tub or shower on a regular basis.  Secure bath mats with non-slip, double-sided rug tape.  Remove throw rugs and tripping hazards from the floors. BEDROOMS  Install night lights.  Make sure a bedside light is easy to reach.  Do not use oversized bedding.  Keep a telephone by your bedside.  Have a firm chair with side arms to use for getting dressed.  Remove throw rugs and tripping hazards from the floor. KITCHEN  Keep handles on pots and pans turned toward the center of the stove. Use back burners when possible.  Clean up spills quickly and allow time for drying.  Avoid walking on wet floors.  Avoid hot utensils and knives.  Position shelves so they are not too high or low.  Place commonly used objects within easy reach.  If necessary, use a sturdy step stool with a grab bar when reaching.  Keep electrical cables out of the way.  Do not use floor polish or wax that makes floors slippery. If you must use wax, use non-skid floor wax.  Remove throw rugs and tripping hazards from the floor. STAIRWAYS  Never leave objects on stairs.  Place handrails on both sides of stairways and use them. Fix any loose handrails. Make sure handrails on both sides of the stairways are as long as the stairs.  Check carpeting to  make sure it is firmly attached along stairs. Make repairs to worn or loose carpet promptly.  Avoid placing throw rugs at the top or bottom of stairways, or properly secure the rug with carpet tape to prevent slippage. Get rid of throw rugs, if possible.  Have an electrician put in a light switch at the top and bottom of the stairs. OTHER FALL PREVENTION TIPS  Wear low-heel or rubber-soled shoes that are supportive and fit well. Wear closed toe shoes.  When using a stepladder, make sure it is fully opened and both spreaders are firmly locked. Do not climb a closed stepladder.  Add  color or contrast paint or tape to grab bars and handrails in your home. Place contrasting color strips on first and last steps.  Learn and use mobility aids as needed. Install an electrical emergency response system.  Turn on lights to avoid dark areas. Replace light bulbs that burn out immediately. Get light switches that glow.  Arrange furniture to create clear pathways. Keep furniture in the same place.  Firmly attach carpet with non-skid or double-sided tape.  Eliminate uneven floor surfaces.  Select a carpet pattern that does not visually hide the edge of steps.  Be aware of all pets. OTHER HOME SAFETY TIPS  Set the water temperature for 120 F (48.8 C).  Keep emergency numbers on or near the telephone.  Keep smoke detectors on every level of the home and near sleeping areas. Document Released: 11/04/2002 Document Revised: 05/15/2012 Document Reviewed: 02/03/2012 [facility] Patient Information 2014 [place].

## 2014-03-22 LAB — NMR, LIPOPROFILE
Cholesterol: 158 mg/dL (ref <200)
HDL Cholesterol by NMR: 50 mg/dL (ref >=40)
HDL Particle Number: 40.9 umol/L (ref >=30.5)
LDL Particle Number: 727 nmol/L (ref <1000)
LDL Size: 20.5 nm (ref >20.5)
LDLC SERPL CALC-MCNC: 73 mg/dL (ref <100)
LP-IR Score: 61 — ABNORMAL HIGH (ref <=45)
Small LDL Particle Number: 278 nmol/L (ref <=527)
Triglycerides by NMR: 175 mg/dL — ABNORMAL HIGH (ref <150)

## 2014-03-22 LAB — CMP14+EGFR
ALT: 12 IU/L (ref 0–32)
AST: 13 IU/L (ref 0–40)
Albumin/Globulin Ratio: 2.2 (ref 1.1–2.5)
Albumin: 4.6 g/dL (ref 3.5–4.8)
Alkaline Phosphatase: 31 IU/L — ABNORMAL LOW (ref 39–117)
BUN/Creatinine Ratio: 16 (ref 11–26)
BUN: 49 mg/dL — ABNORMAL HIGH (ref 8–27)
CO2: 27 mmol/L (ref 18–29)
Calcium: 9.9 mg/dL (ref 8.7–10.3)
Chloride: 100 mmol/L (ref 97–108)
Creatinine, Ser: 3.05 mg/dL (ref 0.57–1.00)
GFR calc Af Amer: 16 mL/min/{1.73_m2} — ABNORMAL LOW (ref >59)
GFR calc non Af Amer: 14 mL/min/{1.73_m2} — ABNORMAL LOW (ref >59)
Globulin, Total: 2.1 g/dL (ref 1.5–4.5)
Glucose: 206 mg/dL — ABNORMAL HIGH (ref 65–99)
Potassium: 5 mmol/L (ref 3.5–5.2)
Sodium: 141 mmol/L (ref 134–144)
Total Bilirubin: 0.2 mg/dL (ref 0.0–1.2)
Total Protein: 6.7 g/dL (ref 6.0–8.5)

## 2014-04-17 ENCOUNTER — Other Ambulatory Visit: Payer: Self-pay | Admitting: *Deleted

## 2014-04-17 MED ORDER — GLUCOSE BLOOD VI STRP: ORAL_STRIP | Status: DC

## 2014-04-18 ENCOUNTER — Other Ambulatory Visit: Payer: Self-pay

## 2014-04-18 DIAGNOSIS — I2510 Atherosclerotic heart disease of native coronary artery without angina pectoris: Secondary | ICD-10-CM

## 2014-04-18 DIAGNOSIS — I10 Essential (primary) hypertension: Secondary | ICD-10-CM

## 2014-04-18 MED ORDER — CLONIDINE HCL 0.1 MG PO TABS: 0.1000 mg | ORAL_TABLET | Freq: Two times a day (BID) | ORAL | Status: DC

## 2014-04-18 MED ORDER — AMLODIPINE BESYLATE 10 MG PO TABS: ORAL_TABLET | ORAL | Status: DC

## 2014-04-18 MED ORDER — INSULIN NPH ISOPHANE & REGULAR (70-30) 100 UNIT/ML ~~LOC~~ SUSP: SUBCUTANEOUS | Status: DC

## 2014-04-18 MED ORDER — POLYSACCHARIDE IRON COMPLEX 150 MG PO CAPS: ORAL_CAPSULE | ORAL | Status: DC

## 2014-04-18 MED ORDER — ISOSORBIDE MONONITRATE ER 120 MG PO TB24: ORAL_TABLET | ORAL | Status: DC

## 2014-04-18 MED ORDER — LABETALOL HCL 200 MG PO TABS: 200.0000 mg | ORAL_TABLET | Freq: Two times a day (BID) | ORAL | Status: DC

## 2014-04-18 MED ORDER — FUROSEMIDE 40 MG PO TABS: ORAL_TABLET | ORAL | Status: DC

## 2014-05-06 ENCOUNTER — Ambulatory Visit (INDEPENDENT_AMBULATORY_CARE_PROVIDER_SITE_OTHER): Payer: Medicare Other | Admitting: Family Medicine

## 2014-05-06 VITALS — BP 146/66 | HR 60 | Temp 98.7°F | Ht 64.0 in | Wt 162.0 lb

## 2014-05-06 DIAGNOSIS — H6090 Unspecified otitis externa, unspecified ear: Secondary | ICD-10-CM

## 2014-05-06 DIAGNOSIS — H60399 Other infective otitis externa, unspecified ear: Secondary | ICD-10-CM

## 2014-05-06 MED ORDER — NEOMYCIN-POLYMYXIN-HC 3.5-10000-1 OT SOLN: 3.0000 [drp] | Freq: Four times a day (QID) | OTIC | Status: DC

## 2014-05-06 NOTE — Progress Notes (Signed)
   Subjective:    Patient ID: [name], female    DOB: 10/16/1932, 80 y.o.   MRN: [id number]  HPI This 80 y.o. female presents for evaluation of ear discomfort and her feet are swelling.   Review of Systems No chest pain, SOB, HA, dizziness, vision change, N/V, diarrhea, constipation, dysuria, urinary urgency or frequency, myalgias, arthralgias or rash.     Objective:   Physical Exam  Vital signs noted  Well developed well nourished female.  HEENT - Head atraumatic Normocephalic                Eyes - PERRLA, Conjuctiva - clear Sclera- Clear EOMI                Ears - EAC's with discomfort with speculum exam and occluded with cerumen                Nose - Nares patent                 Throat - oropharanx wnl Respiratory - Lungs CTA bilateral Cardiac - RRR S1 and S2 without murmur GI - Abdomen soft Nontender and bowel sounds active x 4 Extremities  - Bilateral edema in legs and ankles right worse than left Neuro - Grossly intact.      Assessment & Plan:  Otitis externa - Plan: neomycin-polymyxin-hydrocortisone (CORTISPORIN) otic solution Qid x 7 days  Edema - Venous compression stockings with medium compression.  Lysbeth Penner FNP

## 2014-08-20 ENCOUNTER — Telehealth: Payer: Self-pay | Admitting: Cardiology

## 2014-08-20 NOTE — Telephone Encounter (Signed)
Closed encounter °

## 2014-08-22 ENCOUNTER — Telehealth: Payer: Self-pay | Admitting: Cardiology

## 2014-08-22 NOTE — Telephone Encounter (Signed)
Spoke to [place] with [facility] - Heart failure case management program It is a voluntary service provide by her insurance.they provide a weight scale /tablet send information wireless to to the program  RN informed [name] - EF 65-70% -10/24/2012. [name]  question if patient has active CHF - not seen since 11/2012 will leave it up to patient since it is voluntary program [name] sates she will wait to re evaluate patient after she sees Dr Percival Spanish and primary  if any question may contact [name].

## 2014-08-22 NOTE — Telephone Encounter (Signed)
[name] called in stating that [name] wants to participate in the Heart Failure Clinic. [name] would like to confirm her current diagnosis of CHF and would like her echo results. Please call  Thanks

## 2014-09-29 ENCOUNTER — Telehealth: Payer: Self-pay | Admitting: Family Medicine

## 2014-09-29 NOTE — Telephone Encounter (Signed)
Pt having elevated bp readings, readings are 200/61 and 173/63. Pt has no complaints of HA, dizziness, visual disturbances,pt given appt for tomorrow @ 9 with bill oxford advised pt to bring in her home bp cuff so we can compare the readings, pt voiced understanding

## 2014-09-30 ENCOUNTER — Other Ambulatory Visit: Payer: Self-pay | Admitting: Family Medicine

## 2014-09-30 ENCOUNTER — Ambulatory Visit (INDEPENDENT_AMBULATORY_CARE_PROVIDER_SITE_OTHER): Payer: Medicare Other | Admitting: Family Medicine

## 2014-09-30 ENCOUNTER — Encounter: Payer: Self-pay | Admitting: Family Medicine

## 2014-09-30 VITALS — BP 150/64 | HR 78 | Temp 97.3°F | Ht 64.0 in | Wt 155.4 lb

## 2014-09-30 DIAGNOSIS — N189 Chronic kidney disease, unspecified: Secondary | ICD-10-CM

## 2014-09-30 DIAGNOSIS — N390 Urinary tract infection, site not specified: Secondary | ICD-10-CM

## 2014-09-30 DIAGNOSIS — N184 Chronic kidney disease, stage 4 (severe): Secondary | ICD-10-CM

## 2014-09-30 DIAGNOSIS — D509 Iron deficiency anemia, unspecified: Secondary | ICD-10-CM

## 2014-09-30 DIAGNOSIS — E785 Hyperlipidemia, unspecified: Secondary | ICD-10-CM

## 2014-09-30 DIAGNOSIS — E1122 Type 2 diabetes mellitus with diabetic chronic kidney disease: Secondary | ICD-10-CM

## 2014-09-30 LAB — POCT CBC
Granulocyte percent: 69.1 %G (ref 37–80)
HCT, POC: 29.5 % — AB (ref 37.7–47.9)
Hemoglobin: 10.2 g/dL — AB (ref 12.2–16.2)
Lymph, poc: 1 (ref 0.6–3.4)
MCH, POC: 31.3 pg — AB (ref 27–31.2)
MCHC: 34.7 g/dL (ref 31.8–35.4)
MCV: 90.3 fL (ref 80–97)
MPV: 7.8 fL (ref 0–99.8)
POC Granulocyte: 2.6 (ref 2–6.9)
POC LYMPH PERCENT: 26.9 %L (ref 10–50)
Platelet Count, POC: 200 10*3/uL (ref 142–424)
RBC: 3.3 M/uL — AB (ref 4.04–5.48)
RDW, POC: 12.9 %
WBC: 3.7 10*3/uL — AB (ref 4.6–10.2)

## 2014-09-30 LAB — POCT URINALYSIS DIPSTICK
Bilirubin, UA: NEGATIVE
Glucose, UA: NEGATIVE
Ketones, UA: NEGATIVE
Nitrite, UA: NEGATIVE
Spec Grav, UA: 1.02
Urobilinogen, UA: NEGATIVE
pH, UA: 6

## 2014-09-30 LAB — POCT UA - MICROSCOPIC ONLY
Casts, Ur, LPF, POC: NEGATIVE
Crystals, Ur, HPF, POC: NEGATIVE
Mucus, UA: NEGATIVE
Yeast, UA: NEGATIVE

## 2014-09-30 LAB — POCT GLYCOSYLATED HEMOGLOBIN (HGB A1C): Hemoglobin A1C: 6.8

## 2014-09-30 NOTE — Progress Notes (Signed)
   Subjective:    Patient ID: [name], female    DOB: 11/25/1932, 80 y.o.   MRN: [id number]  HPI C/o lower extremity edema and elevated blood pressure. She has been having some swelling on her right lower extremity from CVA.  She has hx of stage 4 CKD.  She sees nephrology. She has CAD and has 2 coronary artery stents.  She sees Dr. Rosie Fate.  She was in the hospital 6/15 for acute on chronic anemia and received 2 units PRBC.  She has DM.    Review of Systems  Constitutional: Negative for fever.  HENT: Negative for ear pain.   Eyes: Negative for discharge.  Respiratory: Negative for cough.   Cardiovascular: Negative for chest pain.  Gastrointestinal: Negative for abdominal distention.  Endocrine: Negative for polyuria.  Genitourinary: Negative for difficulty urinating.  Musculoskeletal: Negative for gait problem and neck pain.  Skin: Negative for color change and rash.  Neurological: Negative for speech difficulty and headaches.  Psychiatric/Behavioral: Negative for agitation.       Objective:    Pulse 78  Temp(Src) 97.3 F (36.3 C) (Oral)  Wt 155 lb 6.4 oz (70.489 kg) Physical Exam  Constitutional: She is oriented to person, place, and time. She appears well-developed and well-nourished.  HENT:  Head: Normocephalic and atraumatic.  Mouth/Throat: Oropharynx is clear and moist.  Eyes: Pupils are equal, round, and reactive to light.  Neck: Normal range of motion. Neck supple.  Cardiovascular: Normal rate and regular rhythm.   No murmur heard. Pulmonary/Chest: Effort normal and breath sounds normal.  Abdominal: Soft. Bowel sounds are normal. There is no tenderness.  Neurological: She is alert and oriented to person, place, and time.  Skin: Skin is warm and dry.  Psychiatric: She has a normal mood and affect.          Assessment & Plan:  No diagnosis found.   No Follow-up on file.  Lysbeth Penner FNP

## 2014-09-30 NOTE — Addendum Note (Signed)
Addended by: Earlene Plater on: 09/30/2014 09:47 AM   Modules accepted: Miquel Dunn

## 2014-09-30 NOTE — Progress Notes (Signed)
   Subjective:    Patient ID: [name], female    DOB: 07-16-1932, 80 y.o.   MRN: [id number]  HPI Patient is here for routine follow up.  She has been having elevated blood pressure and swelling in her right lower extremity.  She has hx of CVA and since she swells more in right LE.  She has hx of T2DM. She has hx of hyperlipidemia and HTN.  She has been seeing nephrology for stage 4 CKD.  She was hospitalized in 6/15 for acute on chronic anemia due to CKD and IDA.  She received 2 unit prbc's.  She has hx of CAD and sees Dr. Rosie Fate. She is due for labs.    Review of Systems  Constitutional: Negative for fever.  HENT: Negative for ear pain.   Eyes: Negative for discharge.  Respiratory: Negative for cough.   Cardiovascular: Positive for leg swelling. Negative for chest pain.  Gastrointestinal: Negative for abdominal distention.  Endocrine: Negative for polyuria.  Genitourinary: Negative for difficulty urinating.  Musculoskeletal: Negative for gait problem and neck pain.  Skin: Negative for color change and rash.  Neurological: Negative for speech difficulty and headaches.  Psychiatric/Behavioral: Negative for agitation.       Objective:    BP 150/64 mmHg  Pulse 78  Temp(Src) 97.3 F (36.3 C) (Oral)  Ht $R'5\' 4"'ZU$  (1.626 m)  Wt 155 lb 6.4 oz (70.489 kg)  BMI 26.66 kg/m2 Physical Exam  Constitutional: She is oriented to person, place, and time. She appears well-developed and well-nourished.  HENT:  Head: Normocephalic and atraumatic.  Mouth/Throat: Oropharynx is clear and moist.  Eyes: Pupils are equal, round, and reactive to light.  Neck: Normal range of motion. Neck supple.  Cardiovascular: Normal rate and regular rhythm.   No murmur heard. Pulmonary/Chest: Effort normal and breath sounds normal.  Abdominal: Soft. Bowel sounds are normal. There is no tenderness.  Neurological: She is alert and oriented to person, place, and time.  Skin: Skin is warm and dry.  Psychiatric:  She has a normal mood and affect.          Assessment & Plan:     ICD-9-CM ICD-10-CM   1. Chronic kidney disease (CKD), stage IV (severe) 585.4 N18.4 POCT urinalysis dipstick     POCT UA - Microscopic Only     CMP14+EGFR  2. Iron deficiency anemia 280.9 D50.9 POCT CBC  3. Type 2 diabetes mellitus with diabetic chronic kidney disease 250.40 E11.22 POCT glycosylated hemoglobin (Hb A1C)   585.9 N18.9 TSH  4. Hyperlipidemia 272.4 E78.5 Lipid panel     No Follow-up on file.  Lysbeth Penner FNP

## 2014-09-30 NOTE — Addendum Note (Signed)
Addended by: Selmer Dominion on: 09/30/2014 02:15 PM   Modules accepted: Orders

## 2014-10-01 ENCOUNTER — Ambulatory Visit: Payer: Medicare Other | Admitting: Cardiology

## 2014-10-01 LAB — CMP14+EGFR
ALT: 11 IU/L (ref 0–32)
AST: 10 IU/L (ref 0–40)
Albumin/Globulin Ratio: 2 (ref 1.1–2.5)
Albumin: 4.3 g/dL (ref 3.5–4.7)
Alkaline Phosphatase: 31 IU/L — ABNORMAL LOW (ref 39–117)
BUN/Creatinine Ratio: 18 (ref 11–26)
BUN: 42 mg/dL — ABNORMAL HIGH (ref 8–27)
CO2: 23 mmol/L (ref 18–29)
Calcium: 10 mg/dL (ref 8.7–10.3)
Chloride: 104 mmol/L (ref 97–108)
Creatinine, Ser: 2.4 mg/dL — ABNORMAL HIGH (ref 0.57–1.00)
GFR calc Af Amer: 21 mL/min/{1.73_m2} — ABNORMAL LOW (ref >59)
GFR calc non Af Amer: 19 mL/min/{1.73_m2} — ABNORMAL LOW (ref >59)
Globulin, Total: 2.1 g/dL (ref 1.5–4.5)
Glucose: 197 mg/dL — ABNORMAL HIGH (ref 65–99)
Potassium: 4.9 mmol/L (ref 3.5–5.2)
Sodium: 141 mmol/L (ref 134–144)
Total Bilirubin: 0.3 mg/dL (ref 0.0–1.2)
Total Protein: 6.4 g/dL (ref 6.0–8.5)

## 2014-10-01 LAB — LIPID PANEL
Chol/HDL Ratio: 2.6 ratio units (ref 0.0–4.4)
Cholesterol, Total: 174 mg/dL (ref 100–199)
HDL: 66 mg/dL (ref >39)
LDL Calculated: 82 mg/dL (ref 0–99)
Triglycerides: 129 mg/dL (ref 0–149)
VLDL Cholesterol Cal: 26 mg/dL (ref 5–40)

## 2014-10-01 LAB — URINE CULTURE

## 2014-10-01 LAB — TSH: TSH: 3.91 u[IU]/mL (ref 0.450–4.500)

## 2014-10-03 ENCOUNTER — Other Ambulatory Visit: Payer: Self-pay | Admitting: Family Medicine

## 2014-10-08 ENCOUNTER — Encounter: Payer: Self-pay | Admitting: Cardiology

## 2014-10-08 ENCOUNTER — Ambulatory Visit (INDEPENDENT_AMBULATORY_CARE_PROVIDER_SITE_OTHER): Payer: Medicare Other | Admitting: Cardiology

## 2014-10-08 VITALS — BP 132/60 | HR 70 | Ht 64.0 in | Wt 155.0 lb

## 2014-10-08 DIAGNOSIS — I5032 Chronic diastolic (congestive) heart failure: Secondary | ICD-10-CM

## 2014-10-08 DIAGNOSIS — I10 Essential (primary) hypertension: Secondary | ICD-10-CM

## 2014-10-08 MED ORDER — NITROGLYCERIN 0.4 MG SL SUBL: 0.4000 mg | SUBLINGUAL_TABLET | SUBLINGUAL | Status: DC | PRN

## 2014-10-08 NOTE — Patient Instructions (Signed)
Please stop your Clonidine. Use SL Nitroglycerin as directed for chest pain. Continue all other medications as listed.  Follow up in 6 months with Dr. Percival Spanish in [place].  You will receive a letter in the mail 2 months before you are due.  Please call us when you receive this letter to schedule your follow up appointment.

## 2014-10-08 NOTE — Progress Notes (Signed)
HPI The patient presents for follow up of coronary artery disease and HTN.  She reports that she was hospitalized recently at [facility] apparently with tiredness. She was found to be anemic. She says she was transfused 2 units and given iron. However, she refused an EGD. I don't have these records. I am able to review her hemoglobin which is now 10.2 but got down into the sevens. She says she never saw any GI bleeding or black stools. She has however had chest pain. This happens sometimes at night. It happens a couple of times per week. She describes a burning is on like her previous heart pain. She says she takes "acting pill". It turns out this is the clonidine that she is taking. She otherwise says that she's able to be active. 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 With this she denies any chest discomfort, neck or arm discomfort. She's not had any shortness of breath, PND or orthopnea. She hasn't noticed any palpitations. She's had no presyncope or syncope. She doesn't describe any radiation of discomfort to her jaw or arms. She has no associated nausea vomiting or diaphoresis. She does have a little lower extremity swelling.  Allergies  Allergen Reactions  . Propoxyphene N-Acetaminophen   . Simvastatin Other (See Comments)    headache    Current Outpatient Prescriptions  Medication Sig Dispense Refill  . amLODipine (NORVASC) 10 MG tablet TAKE 1 TABLET DAILY 90 tablet 1  . aspirin EC 81 MG tablet Take 81 mg by mouth daily.    Marland Kitchen atorvastatin (LIPITOR) 40 MG tablet TAKE 1 TABLET DAILY 90 tablet 3  . cloNIDine (CATAPRES) 0.1 MG tablet Take 1 tablet (0.1 mg total) by mouth 2 (two) times daily. 180 tablet 1  . fenofibrate 54 MG tablet TAKE 1 TABLET DAILY 30 tablet 5  . insulin NPH-regular Human (NOVOLIN 70/30) (70-30) 100 UNIT/ML injection INJECT 25 UNITS TWICE A DAY EVERY MORNING AND EVENING 20 mL 1  . iron polysaccharides (FERREX 150) 150 MG capsule TAKE  (1)  CAPSULE  TWICE DAILY. 180 capsule 1  .  isosorbide mononitrate (IMDUR) 120 MG 24 hr tablet TAKE 1 TABLET DAILY 90 tablet 1  . lisinopril (PRINIVIL,ZESTRIL) 40 MG tablet Take 40 mg by mouth daily.    . nitroGLYCERIN (NITROSTAT) 0.4 MG SL tablet Place 1 tablet (0.4 mg total) under the tongue every 5 (five) minutes x 3 doses as needed for chest pain. 30 tablet 0  . glucose blood test strip Use as instructed 100 each 12   No current facility-administered medications for this visit.    Past Medical History  Diagnosis Date  . CAD (coronary artery disease) 2003    Last catheterization 2008. Two-vessel PCI of the first OM branch and mid LAD.  Marland Kitchen CVA (cerebral vascular accident) 1996    LEFT SIDE   . IDDM (insulin dependent diabetes mellitus)   . CKD (chronic kidney disease)   . Myocardial infarction   . Congestive heart failure   . Hypercholesteremia   . Hypertension   . Dementia   . DJD (degenerative joint disease) of lumbar spine     Past Surgical History  Procedure Laterality Date  . Appendectomy    . Knee surgery      ROS:  As stated in the HPI and negative for all other systems.  PHYSICAL EXAM BP 132/60 mmHg  Pulse 70  Ht 5\' 4"  (1.626 m)  Wt 155 lb (70.308 kg)  BMI 26.59 kg/m2 GENERAL:  Well appearing  HEENT:  Pupils equal round and reactive, fundi not visualized, oral mucosa unremarkable, dentures, she has a slight knot on her head without any bruising erythema or bleeding  NECK:  No jugular venous distention, waveform within normal limits, carotid upstroke brisk and symmetric, no bruits, positive bilateral transmitted systolic murmur, no thyromegaly LUNGS:  Clear to auscultation bilaterally BACK:  No CVA tenderness CHEST:  Unremarkable HEART:  PMI not displaced or sustained,S1 and S2 within normal limits, no S3, no S4, no clicks, no rubs, apical systolic murmurs, increases with the strain phase of valsava and radiating out the carotids. ABD:  Flat, positive bowel sounds normal in frequency in pitch, no bruits, no  rebound, no guarding, no midline pulsatile mass, no hepatomegaly, no splenomegaly EXT:  2 plus pulses throughout, no cyanosis no , mild left greater than right ankle swelling.  Varicose veins   EKG:  Sinus rhythm, rate 70, axis within normal limits, intervals within normal limits, borderline interventricular conduction delay and nonspecific ST changes. 10/08/2014  ASSESSMENT AND PLAN  CAD, NATIVE VESSEL -  The patient does have chest pain and I cannot absolutely exclude angina. However, with recent GI bleeding and her renal insufficiency she would be high risk for catheterization. In addition she would refuse. Her symptoms very likely heart GI given the recent GI bleed which he refuses EGD. I have convinced her not to take when necessary clonidine for chest pain but did take a sublingual nitroglycerin. He will let me know however if this worsens  or she has any increase in symptoms.  HYPERTENSION, BENIGN -  The blood pressure is at target. No change in medications is indicated. We will continue with therapeutic lifestyle changes (TLC).  DIASTOLIC HEART FAILURE, CHRONIC -  She seems to be euvolemic. She will continue the meds as listed.  CKD (chronic kidney disease) -  She has had chronic renal insufficiency. I reviewed her labs her last creatinine was 2.4. She will continue to have this followed by Wardell Honour, MD and she is being evaluated by nephrologist. She seems to be tolerating her ACE inhibitor because the creatinine is actually lower than previous.  CARDIAC MURMUR, SYSTOLIC -  She has some calcification of her aortic valve on a previous echo.  She's had no change in her exam or symptoms. No further evaluation is necessary.  DYSLIPIDEMIA  - Her lipids are listed below in an excellent range. She will continue the meds as listed.  Lab Results  Component Value Date   CHOL 158 03/21/2014   TRIG 129 09/30/2014   HDL 66 09/30/2014   LDLCALC 82 09/30/2014

## 2014-10-28 ENCOUNTER — Other Ambulatory Visit: Payer: Self-pay | Admitting: Family Medicine

## 2014-10-28 NOTE — Telephone Encounter (Signed)
Last see 09/30/14 B Oxford   This med not on EPIC list

## 2014-10-29 NOTE — Telephone Encounter (Signed)
Please review

## 2014-10-30 ENCOUNTER — Ambulatory Visit (INDEPENDENT_AMBULATORY_CARE_PROVIDER_SITE_OTHER): Payer: Medicare Other | Admitting: Family Medicine

## 2014-10-30 ENCOUNTER — Encounter: Payer: Self-pay | Admitting: Family Medicine

## 2014-10-30 VITALS — Temp 98.1°F | Ht 64.0 in | Wt 153.8 lb

## 2014-10-30 DIAGNOSIS — I10 Essential (primary) hypertension: Secondary | ICD-10-CM

## 2014-10-30 DIAGNOSIS — N189 Chronic kidney disease, unspecified: Secondary | ICD-10-CM

## 2014-10-30 DIAGNOSIS — N184 Chronic kidney disease, stage 4 (severe): Secondary | ICD-10-CM

## 2014-10-30 DIAGNOSIS — E785 Hyperlipidemia, unspecified: Secondary | ICD-10-CM

## 2014-10-30 DIAGNOSIS — D631 Anemia in chronic kidney disease: Secondary | ICD-10-CM

## 2014-10-30 DIAGNOSIS — E1122 Type 2 diabetes mellitus with diabetic chronic kidney disease: Secondary | ICD-10-CM

## 2014-10-30 NOTE — Patient Instructions (Signed)
Constipation Constipation is when a person has fewer than three bowel movements a week, has difficulty having a bowel movement, or has stools that are dry, hard, or larger than normal. As people grow older, constipation is more common. If you try to fix constipation with medicines that make you have a bowel movement (laxatives), the problem may get worse. Long-term laxative use may cause the muscles of the colon to become weak. A low-fiber diet, not taking in enough fluids, and taking certain medicines may make constipation worse.  CAUSES   Certain medicines, such as antidepressants, pain medicine, iron supplements, antacids, and water pills.   Certain diseases, such as diabetes, irritable bowel syndrome (IBS), thyroid disease, or depression.   Not drinking enough water.   Not eating enough fiber-rich foods.   Stress or travel.   Lack of physical activity or exercise.   Ignoring the urge to have a bowel movement.   Using laxatives too much.  SIGNS AND SYMPTOMS   Having fewer than three bowel movements a week.   Straining to have a bowel movement.      Please go to pharmacy and purchase Senna S and take twice daily at same time as blood pressure medication.  Having stools that are hard, dry, or larger than normal.   Feeling full or bloated.   Pain in the lower abdomen.   Not feeling relief after having a bowel movement.  DIAGNOSIS  Your health care provider will take a medical history and perform a physical exam. Further testing may be done for severe constipation. Some tests may include:  A barium enema X-ray to examine your rectum, colon, and, sometimes, your small intestine.   A sigmoidoscopy to examine your lower colon.   A colonoscopy to examine your entire colon. TREATMENT  Treatment will depend on the severity of your constipation and what is causing it. Some dietary treatments include drinking more fluids and eating more fiber-rich foods.  Lifestyle treatments may include regular exercise. If these diet and lifestyle recommendations do not help, your health care provider may recommend taking over-the-counter laxative medicines to help you have bowel movements. Prescription medicines may be prescribed if over-the-counter medicines do not work.  HOME CARE INSTRUCTIONS   Eat foods that have a lot of fiber, such as fruits, vegetables, whole grains, and beans.  Limit foods high in fat and processed sugars, such as french fries, hamburgers, cookies, candies, and soda.   A fiber supplement may be added to your diet if you cannot get enough fiber from foods.   Drink enough fluids to keep your urine clear or pale yellow.   Exercise regularly or as directed by your health care provider.   Go to the restroom when you have the urge to go. Do not hold it.   Only take over-the-counter or prescription medicines as directed by your health care provider. Do not take other medicines for constipation without talking to your health care provider first.  [place] IF:   You have bright red blood in your stool.   Your constipation lasts for more than 4 days or gets worse.   You have abdominal or rectal pain.   You have thin, pencil-like stools.   You have unexplained weight loss. MAKE SURE YOU:   Understand these instructions.  Will watch your condition.  Will get help right away if you are not doing well or get worse. Document Released: 08/12/2004 Document Revised: 11/19/2013 Document Reviewed: 08/26/2013 [facility] Patient Information 2015 [employer]. This information is not intended to replace advice given to you by your health care provider. Make sure you discuss any questions you have with your health care provider.  

## 2014-10-30 NOTE — Progress Notes (Signed)
   Subjective:    Patient ID: [name], female    DOB: 1932/05/23, 80 y.o.   MRN: [id number]  HPI 80 year old female with chief complaint that she thinks her blood count may be low. She has a history of anemia secondary to chronic kidney disease. In addition today she is complaining of constipation. She has to use a laxative fairly regularly to help her bowels move. She has received blood transfusions when her hemoglobin dropped too low.    Review of Systems  HENT: Negative.   Respiratory: Negative.   Cardiovascular: Negative.   Gastrointestinal: Positive for constipation.  Genitourinary: Negative.   Hematological: Negative.   Psychiatric/Behavioral: Negative.        Objective:   Physical Exam  Constitutional: She is oriented to person, place, and time. She appears well-developed and well-nourished.  Conjunctiva appear pale but nailbeds appear normal  Cardiovascular: Normal rate and regular rhythm.   Murmur heard. Pulmonary/Chest: Effort normal and breath sounds normal.  Neurological: She is alert and oriented to person, place, and time.          Assessment & Plan:  Temp(Src) 98.1 F (36.7 C) (Oral)  Ht 5\' 4"  (1.626 m)  Wt 153 lb 12.8 oz (69.763 kg)  BMI 26.39 kg/m2   1. Type 2 diabetes mellitus with diabetic chronic kidney disease  - CBC With differential/Platelet  2. Hyperlipidemia  - CBC With differential/Platelet  3. Essential hypertension Blood pressure elevated today will increase clonidine to 0.2 mg twice a day. Previously she had taken this 3 times a day - CBC With differential/Platelet  4. Anemia associated with chronic renal failure, stage 4 (severe)  - CBC With differential/Platelet

## 2014-10-31 LAB — CBC WITH DIFFERENTIAL
BASOS ABS: 0 10*3/uL (ref 0.0–0.2)
Basos: 1 %
EOS ABS: 0.2 10*3/uL (ref 0.0–0.4)
Eos: 6 %
HCT: 29.4 % — ABNORMAL LOW (ref 34.0–46.6)
HEMOGLOBIN: 10.1 g/dL — AB (ref 11.1–15.9)
IMMATURE GRANS (ABS): 0 10*3/uL (ref 0.0–0.1)
Immature Granulocytes: 0 %
Lymphocytes Absolute: 1.1 10*3/uL (ref 0.7–3.1)
Lymphs: 33 %
MCH: 30.9 pg (ref 26.6–33.0)
MCHC: 34.4 g/dL (ref 31.5–35.7)
MCV: 90 fL (ref 79–97)
MONOS ABS: 0.3 10*3/uL (ref 0.1–0.9)
Monocytes: 8 %
NEUTROS PCT: 52 %
Neutrophils Absolute: 1.7 10*3/uL (ref 1.4–7.0)
Platelets: 212 10*3/uL (ref 150–379)
RBC: 3.27 x10E6/uL — ABNORMAL LOW (ref 3.77–5.28)
RDW: 12.7 % (ref 12.3–15.4)
WBC: 3.3 10*3/uL — ABNORMAL LOW (ref 3.4–10.8)

## 2014-10-31 LAB — SPECIMEN STATUS REPORT

## 2014-11-07 ENCOUNTER — Telehealth: Payer: Self-pay | Admitting: Family Medicine

## 2014-11-11 NOTE — Telephone Encounter (Signed)
Lasix 20 mg , 2 30 1  qd and appt to reassess edema

## 2014-11-11 NOTE — Telephone Encounter (Signed)
Aware,scripty for lasix called to [facility] and appt scheduled for 12-02-14 at 2:30.

## 2014-11-26 ENCOUNTER — Telehealth: Payer: Self-pay | Admitting: Family Medicine

## 2014-11-26 NOTE — Telephone Encounter (Signed)
[name] is aware by VM to come by and get samples (in [place])  NA at pt's number

## 2014-11-29 ENCOUNTER — Other Ambulatory Visit: Payer: Self-pay | Admitting: Family Medicine

## 2014-12-02 ENCOUNTER — Ambulatory Visit (INDEPENDENT_AMBULATORY_CARE_PROVIDER_SITE_OTHER): Payer: Medicare Other | Admitting: Family Medicine

## 2014-12-02 ENCOUNTER — Encounter: Payer: Self-pay | Admitting: Family Medicine

## 2014-12-02 VITALS — BP 153/66 | HR 91 | Temp 97.3°F | Ht 64.0 in | Wt 154.6 lb

## 2014-12-02 DIAGNOSIS — R609 Edema, unspecified: Secondary | ICD-10-CM

## 2014-12-02 NOTE — Progress Notes (Signed)
   Subjective:    Patient ID: [name], female    DOB: December 28, 1931, 81 y.o.   MRN: [id number]  HPI 83-year-old female here to follow-up dependent edema. The edema is not related to heart failure rather to chronic kidney disease and venous stasis disease. I did give her some Lasix to try but she says she hasn't seen no effect on her urine output with Lasix. The swelling does decline overnight. She has some support hose given to her previously says they are hard to take off and on and does not use them. She is followed by [place] [facility] who provided her some scales to weigh herself every day but I really think this is more for folks with congestive heart failure then edema secondary to renal disease and venous disease    Review of Systems  Constitutional: Negative.   HENT: Negative.   Respiratory: Negative.   Cardiovascular: Positive for leg swelling.  Gastrointestinal: Negative.   Genitourinary: Negative.   Musculoskeletal: Negative.   Psychiatric/Behavioral: Negative.        Objective:   Physical Exam  Constitutional: She appears well-developed.  Cardiovascular: Normal rate.   Pulmonary/Chest: Effort normal.  Musculoskeletal: She exhibits edema.  Varicosities are present in both lower extremities right greater than left there is 2+ edema in the ankles and feet today   BP 153/66 mmHg  Pulse 91  Temp(Src) 97.3 F (36.3 C) (Oral)  Ht 5\' 4"  (1.626 m)  Wt 154 lb 9.6 oz (70.126 kg)  BMI 26.52 kg/m2       Assessment & Plan:  1. Edema Suspect edema is multifactorial and we need to increase Lasix to overcome resistance from chronic kidney disease. I have asked her to take 40 mg of Lasix daily 3 days and then as needed thereafter. When she returns in a month would like to check a metabolic panel to   [name] her renal function and potassium

## 2014-12-09 ENCOUNTER — Other Ambulatory Visit: Payer: Self-pay | Admitting: Family Medicine

## 2014-12-22 ENCOUNTER — Telehealth: Payer: Self-pay | Admitting: Family Medicine

## 2014-12-22 NOTE — Telephone Encounter (Signed)
Pt given appt with dr Sabra Heck 2/9 at 2pm.

## 2014-12-23 ENCOUNTER — Telehealth: Payer: Self-pay | Admitting: Family Medicine

## 2014-12-23 NOTE — Telephone Encounter (Signed)
[name] from [place] heart care case manager. Patient told her she had stopped 5 her meds on Friday. She stopped lisinorpril, amlodipine, atorvastatin, isosorbide, fenofibrate. She is checking BP daily at home. Appointment scheduled for Friday at 8:30 with [facility].

## 2014-12-26 ENCOUNTER — Other Ambulatory Visit: Payer: Self-pay | Admitting: Family Medicine

## 2014-12-26 ENCOUNTER — Encounter: Payer: Self-pay | Admitting: Family Medicine

## 2014-12-26 ENCOUNTER — Ambulatory Visit (INDEPENDENT_AMBULATORY_CARE_PROVIDER_SITE_OTHER): Payer: Medicare Other | Admitting: Family Medicine

## 2014-12-26 VITALS — BP 155/67 | HR 76 | Temp 97.3°F | Ht 64.0 in | Wt 151.0 lb

## 2014-12-26 DIAGNOSIS — N184 Chronic kidney disease, stage 4 (severe): Secondary | ICD-10-CM

## 2014-12-26 DIAGNOSIS — N189 Chronic kidney disease, unspecified: Secondary | ICD-10-CM

## 2014-12-26 DIAGNOSIS — E1122 Type 2 diabetes mellitus with diabetic chronic kidney disease: Secondary | ICD-10-CM

## 2014-12-26 DIAGNOSIS — I10 Essential (primary) hypertension: Secondary | ICD-10-CM

## 2014-12-26 DIAGNOSIS — E785 Hyperlipidemia, unspecified: Secondary | ICD-10-CM

## 2014-12-26 LAB — POCT UA - MICROALBUMIN: Microalbumin Ur, POC: 100 mg/L

## 2014-12-26 MED ORDER — INSULIN NPH (HUMAN) (ISOPHANE) 100 UNIT/ML ~~LOC~~ SUSP: 25.0000 [IU] | Freq: Two times a day (BID) | SUBCUTANEOUS | Status: DC

## 2014-12-26 NOTE — Patient Instructions (Signed)

## 2014-12-26 NOTE — Progress Notes (Signed)
Subjective:    Patient ID: [name], female    DOB: 04-Oct-1932, 81 y.o.   MRN: [id number]  HPI  81 year old female comes in today with concerns that her maintenance medications may have caused constipation last week. She did have a bowl movement after stopping the medications for 5 days and taking a laxative. She has since restarted her meds at the advice of a nurse from her insurance company. She also brings a paper from her insurance company stating that Novolin is no longer covered and that she should switch to Humulin or Humalog.  Patient Active Problem List   Diagnosis Date Noted  . Rhabdomyolysis 03/21/2014  . Benign paroxysmal positional vertigo 03/21/2014  . Avulsion fracture of distal phalanx of finger 03/17/2014  . HTN (hypertension) 08/09/2013  . Palpitations 06/21/2013  . Lumbar spondylosis 05/16/2013  . Hyponatremia 05/15/2013  . Overweight 05/15/2013  . Acute renal insufficiency 05/15/2013  . Perianal abscess 05/14/2013  . Heat exhaustion 05/14/2013  . Fall 05/14/2013  . Medication management 03/22/2013  . Varicose veins of lower extremities with other complications 87/68/1157  . Swelling of foot joint 03/01/2013  . Osteoarthritis 03/01/2013  . Chronic anemia 10/29/2012  . Hypoglycemia 10/29/2012  . Acute diastolic congestive heart failure 10/29/2012  . NSVT (nonsustained ventricular tachycardia) 10/28/2012  . OA (osteoarthritis) 10/27/2012  . Acute renal failure 10/26/2012  . Unstable angina 10/24/2012  . Chest pain 10/21/2012  . Hypertension 10/26/2011  . DM (diabetes mellitus) 03/30/2011  . CKD (chronic kidney disease) stage 4, GFR 15-29 ml/min 03/30/2011  . HOCM 09/15/2010  . DIASTOLIC HEART FAILURE, CHRONIC 07/20/2010  . CARDIAC MURMUR, SYSTOLIC 26/20/3559  . Cerebrovascular disease 03/09/2010  . Hyperlipidemia 12/05/2008  . Arteriosclerotic cardiovascular disease (ASCVD) 12/05/2008   Outpatient Encounter Prescriptions as of 12/26/2014    Medication Sig  . amLODipine (NORVASC) 10 MG tablet TAKE 1 TABLET DAILY  . aspirin EC 81 MG tablet Take 81 mg by mouth daily.  Marland Kitchen atorvastatin (LIPITOR) 40 MG tablet TAKE 1 TABLET DAILY  . cloNIDine (CATAPRES) 0.1 MG tablet TAKE 1 TABLET (0.1 MG TOTAL)  BY MOUTH 2 (TWO) TIMES DAILY.  . fenofibrate 54 MG tablet TAKE 1 TABLET DAILY  . furosemide (LASIX) 20 MG tablet TAKE ONE TABLET BY MOUTH ONE TIME DAILY  . glucose blood test strip Use as instructed  . insulin NPH-regular Human (NOVOLIN 70/30) (70-30) 100 UNIT/ML injection INJECT 25 UNITS TWICE A DAY EVERY MORNING AND EVENING  . isosorbide mononitrate (IMDUR) 120 MG 24 hr tablet TAKE ONE TABLET BY MOUTH ONE TIME DAILY  . lisinopril (PRINIVIL,ZESTRIL) 40 MG tablet Take 40 mg by mouth daily.  . nitroGLYCERIN (NITROSTAT) 0.4 MG SL tablet Place 1 tablet (0.4 mg total) under the tongue every 5 (five) minutes x 3 doses as needed for chest pain.  . [DISCONTINUED] iron polysaccharides (FERREX 150) 150 MG capsule TAKE  (1)  CAPSULE  TWICE DAILY.     Review of Systems  Constitutional: Negative.   HENT: Negative.   Respiratory: Negative.   Cardiovascular: Positive for leg swelling.  Genitourinary: Negative.   Musculoskeletal: Negative.   Psychiatric/Behavioral: Negative.        Objective:   Physical Exam  Constitutional: She is oriented to person, place, and time. She appears well-developed.  HENT:  Head: Normocephalic.  Cardiovascular: Normal rate.   Holosystolic murmur  Pulmonary/Chest: Effort normal and breath sounds normal.  Abdominal: Soft.  Musculoskeletal: Normal range of motion.  Neurological: She is alert and  oriented to person, place, and time.    BP 155/67 mmHg  Pulse 76  Temp(Src) 97.3 F (36.3 C) (Oral)  Ht $R'5\' 4"'HF$  (1.626 m)  Wt 151 lb (68.493 kg)  BMI 25.91 kg/m2       Assessment & Plan:  1. Essential hypertension BP okay on current meds  2. Hyperlipidemia Will hold lipid meds and follow; pt desires less  meds  3. CKD (chronic kidney disease) stage 4, GFR 15-29 ml/min Taking lasix q day - BMP8+EGFR  4. Type 2 diabetes mellitus with diabetic chronic kidney disease Switch to Humulin N per ins request  Wardell Honour MD

## 2014-12-26 NOTE — Addendum Note (Signed)
Addended by: Earlene Plater on: 12/26/2014 09:17 AM   Modules accepted: Orders

## 2014-12-27 LAB — BMP8+EGFR
BUN/Creatinine Ratio: 22 (ref 11–26)
BUN: 67 mg/dL — ABNORMAL HIGH (ref 8–27)
CHLORIDE: 99 mmol/L (ref 97–108)
CO2: 22 mmol/L (ref 18–29)
CREATININE: 3.11 mg/dL — AB (ref 0.57–1.00)
Calcium: 10 mg/dL (ref 8.7–10.3)
GFR calc Af Amer: 16 mL/min/{1.73_m2} — ABNORMAL LOW (ref >59)
GFR, EST NON AFRICAN AMERICAN: 14 mL/min/{1.73_m2} — AB (ref >59)
GLUCOSE: 236 mg/dL — AB (ref 65–99)
Potassium: 5.4 mmol/L — ABNORMAL HIGH (ref 3.5–5.2)
Sodium: 138 mmol/L (ref 134–144)

## 2014-12-27 LAB — MICROALBUMIN, URINE: MICROALBUM., U, RANDOM: 569.3 ug/mL — AB (ref 0.0–17.0)

## 2014-12-30 ENCOUNTER — Other Ambulatory Visit: Payer: Self-pay

## 2014-12-30 DIAGNOSIS — R7989 Other specified abnormal findings of blood chemistry: Secondary | ICD-10-CM

## 2015-01-01 ENCOUNTER — Other Ambulatory Visit: Payer: Self-pay | Admitting: *Deleted

## 2015-01-01 ENCOUNTER — Other Ambulatory Visit: Payer: Self-pay | Admitting: Family Medicine

## 2015-01-01 DIAGNOSIS — N184 Chronic kidney disease, stage 4 (severe): Secondary | ICD-10-CM

## 2015-01-04 DIAGNOSIS — N189 Chronic kidney disease, unspecified: Secondary | ICD-10-CM | POA: Diagnosis not present

## 2015-01-04 DIAGNOSIS — Z7982 Long term (current) use of aspirin: Secondary | ICD-10-CM | POA: Diagnosis not present

## 2015-01-04 DIAGNOSIS — R079 Chest pain, unspecified: Secondary | ICD-10-CM | POA: Diagnosis not present

## 2015-01-04 DIAGNOSIS — I2510 Atherosclerotic heart disease of native coronary artery without angina pectoris: Secondary | ICD-10-CM | POA: Diagnosis not present

## 2015-01-04 DIAGNOSIS — I129 Hypertensive chronic kidney disease with stage 1 through stage 4 chronic kidney disease, or unspecified chronic kidney disease: Secondary | ICD-10-CM | POA: Diagnosis not present

## 2015-01-04 DIAGNOSIS — E119 Type 2 diabetes mellitus without complications: Secondary | ICD-10-CM | POA: Diagnosis not present

## 2015-01-04 DIAGNOSIS — Z794 Long term (current) use of insulin: Secondary | ICD-10-CM | POA: Diagnosis not present

## 2015-01-04 DIAGNOSIS — N183 Chronic kidney disease, stage 3 (moderate): Secondary | ICD-10-CM | POA: Diagnosis not present

## 2015-01-04 DIAGNOSIS — I159 Secondary hypertension, unspecified: Secondary | ICD-10-CM | POA: Diagnosis not present

## 2015-01-04 DIAGNOSIS — I700 Atherosclerosis of aorta: Secondary | ICD-10-CM | POA: Diagnosis not present

## 2015-01-04 DIAGNOSIS — Z79899 Other long term (current) drug therapy: Secondary | ICD-10-CM | POA: Diagnosis not present

## 2015-01-04 DIAGNOSIS — I69351 Hemiplegia and hemiparesis following cerebral infarction affecting right dominant side: Secondary | ICD-10-CM | POA: Diagnosis not present

## 2015-01-04 DIAGNOSIS — E785 Hyperlipidemia, unspecified: Secondary | ICD-10-CM | POA: Diagnosis not present

## 2015-01-05 ENCOUNTER — Telehealth: Payer: Self-pay | Admitting: Family Medicine

## 2015-01-05 DIAGNOSIS — I2510 Atherosclerotic heart disease of native coronary artery without angina pectoris: Secondary | ICD-10-CM | POA: Diagnosis not present

## 2015-01-05 DIAGNOSIS — R079 Chest pain, unspecified: Secondary | ICD-10-CM | POA: Diagnosis not present

## 2015-01-05 NOTE — Telephone Encounter (Signed)
They found the rx and do not need a call back now.

## 2015-01-06 ENCOUNTER — Telehealth: Payer: Self-pay | Admitting: Family Medicine

## 2015-01-06 ENCOUNTER — Ambulatory Visit: Payer: Medicare Other | Admitting: Family Medicine

## 2015-01-06 NOTE — Telephone Encounter (Signed)
Stp she was seen at [facility] for chest pain, she left yesterday but they had wanted her to stay another day for further testing, has appt with Dr.Hochrein tomorrow and Dr.Miller in 2 weeks, pt advised to see Dr.Hochrein tomorrow and if she still has questions regarding her medications or needs to be seen to call back. Pt voiced understanding.

## 2015-01-07 ENCOUNTER — Encounter: Payer: Self-pay | Admitting: Cardiology

## 2015-01-07 ENCOUNTER — Ambulatory Visit (INDEPENDENT_AMBULATORY_CARE_PROVIDER_SITE_OTHER): Payer: Medicare Other | Admitting: Cardiology

## 2015-01-07 VITALS — BP 110/52 | HR 53 | Ht 64.0 in | Wt 152.0 lb

## 2015-01-07 DIAGNOSIS — I2510 Atherosclerotic heart disease of native coronary artery without angina pectoris: Secondary | ICD-10-CM

## 2015-01-07 NOTE — Patient Instructions (Signed)
The current medical regimen is effective;  continue present plan and medications.  Follow up in 1 month with Dr Percival Spanish in [place].  Thank you for choosing [facility]!!

## 2015-01-07 NOTE — Progress Notes (Signed)
HPI The patient presents for follow up of coronary artery disease and HTN.  She reports that she was hospitalized at [facility] with anemia several weeks ago.  However, she did not want an EGD.   She says she was transfused 2 units and given iron.   When I last saw her she was having some chest discomfort but she wanted conservative management of this. She now has been hospitalized at [facility] again with chest pain responsive to nitroglycerin. This happened last Saturday she took a nitroglycerin. She had severe pain again on Sunday while at church and they called and sent her to the hospital. This was again responsive to nitroglycerin.  In the hospital she had negative cardiac enzymes. She had some rebound hypertension because she had been off of her medications. In particular she has not been able to get her clonidine. There was some confusion about her discharge medications as described below.  Allergies  Allergen Reactions  . Propoxyphene N-Acetaminophen   . Simvastatin Other (See Comments)    headache    Current Outpatient Prescriptions  Medication Sig Dispense Refill  . amLODipine (NORVASC) 10 MG tablet TAKE ONE TABLET BY MOUTH ONE TIME DAILY 90 tablet 1  . aspirin EC 81 MG tablet Take 81 mg by mouth daily.    . cloNIDine (CATAPRES) 0.1 MG tablet TAKE 1 TABLET (0.1 MG TOTAL)  BY MOUTH 2 (TWO) TIMES DAILY. 180 tablet 0  . fenofibrate 54 MG tablet TAKE 1 TABLET DAILY 30 tablet 5  . furosemide (LASIX) 20 MG tablet TAKE ONE TABLET BY MOUTH ONE TIME DAILY 30 tablet 0  . insulin NPH Human (HUMULIN N) 100 UNIT/ML injection Inject 0.25 mLs (25 Units total) into the skin 2 (two) times daily. 10 mL 11  . insulin NPH-regular Human (NOVOLIN 70/30) (70-30) 100 UNIT/ML injection INJECT 25 UNITS TWICE A DAY EVERY MORNING AND EVENING 20 mL 1  . isosorbide mononitrate (IMDUR) 120 MG 24 hr tablet TAKE ONE TABLET BY MOUTH ONE TIME DAILY 30 tablet 3  . lisinopril (PRINIVIL,ZESTRIL) 40 MG tablet Take 40 mg by  mouth daily.    . nitroGLYCERIN (NITROSTAT) 0.4 MG SL tablet Place 1 tablet (0.4 mg total) under the tongue every 5 (five) minutes x 3 doses as needed for chest pain. 30 tablet 11  . glucose blood test strip Use as instructed 100 each 12   No current facility-administered medications for this visit.    Past Medical History  Diagnosis Date  . CAD (coronary artery disease) 2003    Last catheterization 2008. Two-vessel PCI of the first OM branch and mid LAD.  Marland Kitchen CVA (cerebral vascular accident) 1996    LEFT SIDE   . IDDM (insulin dependent diabetes mellitus)   . CKD (chronic kidney disease)   . Myocardial infarction   . Congestive heart failure   . Hypercholesteremia   . Hypertension   . Dementia   . DJD (degenerative joint disease) of lumbar spine     Past Surgical History  Procedure Laterality Date  . Appendectomy    . Knee surgery      ROS:  As stated in the HPI and negative for all other systems.  PHYSICAL EXAM BP 110/52 mmHg  Pulse 53  Ht 5\' 4"  (1.626 m)  Wt 152 lb (68.947 kg)  BMI 26.08 kg/m2 GENERAL:  Well appearing HEENT:  Pupils equal round and reactive, fundi not visualized, oral mucosa unremarkable, dentures, she has a slight knot on her head without any  bruising erythema or bleeding  NECK:  No jugular venous distention, waveform within normal limits, carotid upstroke brisk and symmetric, no bruits, positive bilateral transmitted systolic murmur, no thyromegaly LUNGS:  Clear to auscultation bilaterally BACK:  No CVA tenderness CHEST:  Unremarkable HEART:  PMI not displaced or sustained,S1 and S2 within normal limits, no S3, no S4, no clicks, no rubs, apical systolic murmurs, increases with the strain phase of valsava and radiating out the carotids. ABD:  Flat, positive bowel sounds normal in frequency in pitch, no bruits, no rebound, no guarding, no midline pulsatile mass, no hepatomegaly, no splenomegaly EXT:  2 plus pulses throughout, no cyanosis no , mild left  greater than right ankle swelling.  Varicose veins   EKG:  Sinus rhythm, rate 53, axis within normal limits, intervals within normal limits, borderline interventricular conduction delay and nonspecific ST changes. 01/07/2015  ASSESSMENT AND PLAN  CAD, NATIVE VESSEL -  The patient is adamant that she does not want catheterization if there is any chance this will precipitate her kidney disease for dialysis. She wants medical management only. There was great confusion about her medications. I was able finally to retrieve some records from her hospitalization. However, her discharge medications which include labetalol and Demadex are not what she is taking. I did not have her listed on labetalol and she's not on this. Her heart rate would not allow [place] to start this. I think for now she needs to stay on the meds as listed. We'll see her back in a month and sooner if she has any further chest pain. She understands that if this truly is unstable angina she has a risk of death without cardiac catheterization. She prefers death to dialysis.  HYPERTENSION, BENIGN -  The blood pressure is at target. No change in medications is indicated. We will continue with therapeutic lifestyle changes (TLC). Her blood pressure has been difficult to control. I understand she was not on her clonidine and had some rebound hypertension recently. However, I think she needs to continue this medication.  DIASTOLIC HEART FAILURE, CHRONIC -  She seems to be euvolemic. She will continue the meds as listed.  CKD (chronic kidney disease) -  She has had chronic renal insufficiency. I reviewed her labs her last creatinine was 3.06 . She will continue to have this followed by Wardell Honour, MD and she is being evaluated by nephrologist. She seems to be tolerating her ACE inhibitor because the creatinine is actually lower than previous.  CARDIAC MURMUR, SYSTOLIC -  She has some calcification of her aortic valve on a previous echo.   She's had no change in her exam or symptoms. No further evaluation is necessary.  DYSLIPIDEMIA  - Her lipids are listed below in an excellent range. She will continue the meds as listed.

## 2015-01-22 ENCOUNTER — Other Ambulatory Visit (INDEPENDENT_AMBULATORY_CARE_PROVIDER_SITE_OTHER): Payer: Medicare Other

## 2015-01-22 DIAGNOSIS — N184 Chronic kidney disease, stage 4 (severe): Secondary | ICD-10-CM | POA: Diagnosis not present

## 2015-01-22 NOTE — Progress Notes (Signed)
Lab only 

## 2015-01-23 LAB — RENAL FUNCTION PANEL
Albumin: 4.5 g/dL (ref 3.5–4.7)
BUN/Creatinine Ratio: 18 (ref 11–26)
BUN: 58 mg/dL — ABNORMAL HIGH (ref 8–27)
CALCIUM: 10.1 mg/dL (ref 8.7–10.3)
CHLORIDE: 105 mmol/L (ref 97–108)
CO2: 20 mmol/L (ref 18–29)
Creatinine, Ser: 3.19 mg/dL (ref 0.57–1.00)
GFR calc Af Amer: 15 mL/min/{1.73_m2} — ABNORMAL LOW (ref >59)
GFR calc non Af Amer: 13 mL/min/{1.73_m2} — ABNORMAL LOW (ref >59)
GLUCOSE: 126 mg/dL — AB (ref 65–99)
POTASSIUM: 6 mmol/L — AB (ref 3.5–5.2)
Phosphorus: 4 mg/dL (ref 2.5–4.5)
SODIUM: 142 mmol/L (ref 134–144)

## 2015-01-26 ENCOUNTER — Telehealth: Payer: Self-pay | Admitting: Family Medicine

## 2015-01-26 DIAGNOSIS — D509 Iron deficiency anemia, unspecified: Secondary | ICD-10-CM | POA: Diagnosis not present

## 2015-01-26 DIAGNOSIS — E875 Hyperkalemia: Secondary | ICD-10-CM | POA: Diagnosis not present

## 2015-01-26 DIAGNOSIS — N185 Chronic kidney disease, stage 5: Secondary | ICD-10-CM | POA: Diagnosis not present

## 2015-01-26 DIAGNOSIS — I10 Essential (primary) hypertension: Secondary | ICD-10-CM | POA: Diagnosis not present

## 2015-01-27 ENCOUNTER — Telehealth: Payer: Self-pay | Admitting: Family Medicine

## 2015-01-27 NOTE — Telephone Encounter (Signed)
Okay to change as requested. 

## 2015-01-28 ENCOUNTER — Ambulatory Visit: Payer: Medicare Other | Admitting: *Deleted

## 2015-01-28 VITALS — BP 130/62 | HR 74

## 2015-01-28 DIAGNOSIS — Z0130 Encounter for examination of blood pressure without abnormal findings: Secondary | ICD-10-CM

## 2015-01-28 MED ORDER — CLONIDINE HCL 0.2 MG PO TABS: 0.2000 mg | ORAL_TABLET | Freq: Two times a day (BID) | ORAL | Status: DC

## 2015-01-28 NOTE — Progress Notes (Signed)
Patient here for blood pressure recheck due to elevated readings at previous visits.   Her monitor reading was 154/60.

## 2015-01-28 NOTE — Progress Notes (Signed)
Patient is prescribed Clonidine .1mg  BID. She got up at 3:00 am to use the restroom and she checked her blood pressure at that time. Systolic was 939. The reading was elevated so she took a clonidine. She took a second dose later in the morning and her blood pressure improved. Stated systolic was around 030 before 2nd dose. Reading at 10:00 am was 157/64 after 2 doses of clonidine.  She is due to refill Clonidine within the next few days.  Her monitor reads about 16 points higher systolic pressure than our monitor.  Dr Sabra Heck is out of the office until next week.  Discussed with Evelina Dun, FNP.  Clonidine increased to .2mg  BID. She will continue to check her blood pressure at home and keep a record of them. She will follow up with [place] next week with the readings and sooner if she continues to have elevated readings or any negative side effects.  Patient verbalized understanding and agreement to plan.

## 2015-01-28 NOTE — Telephone Encounter (Signed)
Stp and she will come in today at 11 to make sure her BP cuff is accurate.

## 2015-01-29 DIAGNOSIS — Z823 Family history of stroke: Secondary | ICD-10-CM | POA: Diagnosis not present

## 2015-01-29 DIAGNOSIS — S20219A Contusion of unspecified front wall of thorax, initial encounter: Secondary | ICD-10-CM | POA: Diagnosis not present

## 2015-01-29 DIAGNOSIS — S2222XA Fracture of body of sternum, initial encounter for closed fracture: Secondary | ICD-10-CM | POA: Diagnosis not present

## 2015-01-29 DIAGNOSIS — R112 Nausea with vomiting, unspecified: Secondary | ICD-10-CM | POA: Diagnosis not present

## 2015-01-29 DIAGNOSIS — S2691XA Contusion of heart, unspecified with or without hemopericardium, initial encounter: Secondary | ICD-10-CM | POA: Diagnosis not present

## 2015-01-29 DIAGNOSIS — R079 Chest pain, unspecified: Secondary | ICD-10-CM | POA: Diagnosis not present

## 2015-01-29 DIAGNOSIS — E782 Mixed hyperlipidemia: Secondary | ICD-10-CM | POA: Diagnosis not present

## 2015-01-29 DIAGNOSIS — Z8673 Personal history of transient ischemic attack (TIA), and cerebral infarction without residual deficits: Secondary | ICD-10-CM | POA: Diagnosis not present

## 2015-01-29 DIAGNOSIS — Z833 Family history of diabetes mellitus: Secondary | ICD-10-CM | POA: Diagnosis not present

## 2015-01-29 DIAGNOSIS — I2510 Atherosclerotic heart disease of native coronary artery without angina pectoris: Secondary | ICD-10-CM | POA: Diagnosis not present

## 2015-01-29 DIAGNOSIS — S2220XA Unspecified fracture of sternum, initial encounter for closed fracture: Secondary | ICD-10-CM | POA: Diagnosis not present

## 2015-01-29 DIAGNOSIS — Z8249 Family history of ischemic heart disease and other diseases of the circulatory system: Secondary | ICD-10-CM | POA: Diagnosis not present

## 2015-01-29 DIAGNOSIS — Z794 Long term (current) use of insulin: Secondary | ICD-10-CM | POA: Diagnosis not present

## 2015-01-29 DIAGNOSIS — Z955 Presence of coronary angioplasty implant and graft: Secondary | ICD-10-CM | POA: Diagnosis not present

## 2015-01-29 DIAGNOSIS — R0789 Other chest pain: Secondary | ICD-10-CM | POA: Diagnosis not present

## 2015-01-29 DIAGNOSIS — N189 Chronic kidney disease, unspecified: Secondary | ICD-10-CM | POA: Diagnosis not present

## 2015-01-29 DIAGNOSIS — E119 Type 2 diabetes mellitus without complications: Secondary | ICD-10-CM | POA: Diagnosis not present

## 2015-01-29 DIAGNOSIS — Z7982 Long term (current) use of aspirin: Secondary | ICD-10-CM | POA: Diagnosis not present

## 2015-01-29 DIAGNOSIS — I517 Cardiomegaly: Secondary | ICD-10-CM | POA: Diagnosis not present

## 2015-01-29 DIAGNOSIS — I129 Hypertensive chronic kidney disease with stage 1 through stage 4 chronic kidney disease, or unspecified chronic kidney disease: Secondary | ICD-10-CM | POA: Diagnosis not present

## 2015-01-29 DIAGNOSIS — R52 Pain, unspecified: Secondary | ICD-10-CM | POA: Diagnosis not present

## 2015-01-29 DIAGNOSIS — E785 Hyperlipidemia, unspecified: Secondary | ICD-10-CM | POA: Diagnosis not present

## 2015-01-29 DIAGNOSIS — Z79899 Other long term (current) drug therapy: Secondary | ICD-10-CM | POA: Diagnosis not present

## 2015-01-29 NOTE — Telephone Encounter (Signed)
Patient came in yesterday to have BP checked. Medication changed.

## 2015-01-30 DIAGNOSIS — S2611XA Contusion of heart without hemopericardium, initial encounter: Secondary | ICD-10-CM | POA: Diagnosis not present

## 2015-01-31 DIAGNOSIS — S2220XA Unspecified fracture of sternum, initial encounter for closed fracture: Secondary | ICD-10-CM | POA: Diagnosis not present

## 2015-02-02 ENCOUNTER — Telehealth: Payer: Self-pay | Admitting: Cardiology

## 2015-02-02 ENCOUNTER — Other Ambulatory Visit: Payer: Self-pay | Admitting: *Deleted
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
## 2015-02-02 NOTE — Telephone Encounter (Signed)
Ask her please to follow up in PCP office first following the hospitalization.  If they believe that there are any cardiac issues they can refer back to [place].
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
## 2015-02-03 NOTE — Telephone Encounter (Signed)
Spoke to daughter, she voiced understanding of physician's instructions.

## 2015-02-04 ENCOUNTER — Telehealth: Payer: Self-pay | Admitting: Family Medicine

## 2015-02-11 ENCOUNTER — Encounter: Payer: Self-pay | Admitting: Cardiology

## 2015-02-11 ENCOUNTER — Ambulatory Visit (INDEPENDENT_AMBULATORY_CARE_PROVIDER_SITE_OTHER): Payer: Medicare Other | Admitting: Cardiology

## 2015-02-11 VITALS — BP 118/50 | HR 60 | Ht 64.0 in | Wt 153.0 lb

## 2015-02-11 DIAGNOSIS — R072 Precordial pain: Secondary | ICD-10-CM
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
## 2015-02-11 NOTE — Patient Instructions (Signed)
The current medical regimen is effective;  continue present plan and medications.  Follow up in 6 months with Dr Hochrein.  You will receive a letter in the mail 2 months before you are due.  Please call us when you receive this letter to schedule your follow up appointment.  

## 2015-02-13 ENCOUNTER — Ambulatory Visit: Payer: Medicare Other | Admitting: Nurse Practitioner

## 2015-02-24 ENCOUNTER — Encounter: Payer: Self-pay | Admitting: Family Medicine

## 2015-02-24 ENCOUNTER — Ambulatory Visit (INDEPENDENT_AMBULATORY_CARE_PROVIDER_SITE_OTHER): Payer: Medicare Other | Admitting: Family Medicine

## 2015-02-24 VITALS — BP 203/69 | HR 71 | Temp 97.9°F | Ht 64.0 in | Wt 157.0 lb

## 2015-02-24 DIAGNOSIS — R079 Chest pain, unspecified: Secondary | ICD-10-CM

## 2015-02-24 MED ORDER — TRAMADOL HCL 50 MG PO TABS: 50.0000 mg | ORAL_TABLET | Freq: Three times a day (TID) | ORAL | Status: DC | PRN
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
## 2015-03-04 ENCOUNTER — Ambulatory Visit: Payer: Medicare Other | Admitting: Family Medicine

## 2015-03-18 ENCOUNTER — Other Ambulatory Visit: Payer: Self-pay | Admitting: Family Medicine

## 2015-03-19 ENCOUNTER — Telehealth: Payer: Self-pay | Admitting: Family Medicine

## 2015-04-02 ENCOUNTER — Other Ambulatory Visit (INDEPENDENT_AMBULATORY_CARE_PROVIDER_SITE_OTHER): Payer: Medicare Other

## 2015-04-02 DIAGNOSIS — N184 Chronic kidney disease, stage 4 (severe): Secondary | ICD-10-CM

## 2015-04-02 DIAGNOSIS — E559 Vitamin D deficiency, unspecified: Secondary | ICD-10-CM | POA: Diagnosis not present

## 2015-04-02 DIAGNOSIS — Z79899 Other long term (current) drug therapy: Secondary | ICD-10-CM | POA: Diagnosis not present

## 2015-04-02 DIAGNOSIS — R809 Proteinuria, unspecified: Secondary | ICD-10-CM

## 2015-04-02 DIAGNOSIS — D509 Iron deficiency anemia, unspecified: Secondary | ICD-10-CM | POA: Diagnosis not present

## 2015-04-02 NOTE — Progress Notes (Signed)
Labs for doctor [email or web address]

## 2015-04-03 LAB — RENAL FUNCTION PANEL
ALBUMIN: 3.9 g/dL (ref 3.5–4.7)
BUN / CREAT RATIO: 19 (ref 11–26)
BUN: 54 mg/dL — ABNORMAL HIGH (ref 8–27)
CO2: 20 mmol/L (ref 18–29)
Calcium: 9.9 mg/dL (ref 8.7–10.3)
Chloride: 102 mmol/L (ref 97–108)
Creatinine, Ser: 2.77 mg/dL — ABNORMAL HIGH (ref 0.57–1.00)
GFR, EST AFRICAN AMERICAN: 18 mL/min/{1.73_m2} — AB (ref >59)
GFR, EST NON AFRICAN AMERICAN: 16 mL/min/{1.73_m2} — AB (ref >59)
GLUCOSE: 277 mg/dL — AB (ref 65–99)
Phosphorus: 3.6 mg/dL (ref 2.5–4.5)
Potassium: 5.8 mmol/L — ABNORMAL HIGH (ref 3.5–5.2)
Sodium: 140 mmol/L (ref 134–144)

## 2015-04-03 LAB — CBC WITH DIFFERENTIAL/PLATELET
Basophils Absolute: 0 10*3/uL (ref 0.0–0.2)
Basos: 0 %
EOS (ABSOLUTE): 0.1 10*3/uL (ref 0.0–0.4)
EOS: 4 %
HEMATOCRIT: 27.2 % — AB (ref 34.0–46.6)
HEMOGLOBIN: 9.1 g/dL — AB (ref 11.1–15.9)
Immature Grans (Abs): 0 10*3/uL (ref 0.0–0.1)
Immature Granulocytes: 0 %
LYMPHS ABS: 0.7 10*3/uL (ref 0.7–3.1)
Lymphs: 28 %
MCH: 29.6 pg (ref 26.6–33.0)
MCHC: 33.5 g/dL (ref 31.5–35.7)
MCV: 89 fL (ref 79–97)
Monocytes Absolute: 0.3 10*3/uL (ref 0.1–0.9)
Monocytes: 10 %
Neutrophils Absolute: 1.5 10*3/uL (ref 1.4–7.0)
Neutrophils: 58 %
Platelets: 214 10*3/uL (ref 150–379)
RBC: 3.07 x10E6/uL — ABNORMAL LOW (ref 3.77–5.28)
RDW: 12.6 % (ref 12.3–15.4)
WBC: 2.6 10*3/uL — ABNORMAL LOW (ref 3.4–10.8)

## 2015-04-03 LAB — PROTEIN / CREATININE RATIO, URINE
CREATININE, UR: 65.1 mg/dL (ref 15.0–278.0)
PROTEIN UR: 201.7 mg/dL — AB (ref 0.0–15.0)
PROTEIN/CREAT RATIO: 3098 mg/g{creat} — AB (ref 0–200)

## 2015-04-03 LAB — PTH, INTACT AND CALCIUM: PTH: 105 pg/mL — AB (ref 15–65)

## 2015-04-03 LAB — IRON AND TIBC
IRON SATURATION: 41 % (ref 15–55)
Iron: 82 ug/dL (ref 27–139)
Total Iron Binding Capacity: 200 ug/dL — ABNORMAL LOW (ref 250–450)
UIBC: 118 ug/dL (ref 118–369)

## 2015-04-03 LAB — VITAMIN D 25 HYDROXY (VIT D DEFICIENCY, FRACTURES): Vit D, 25-Hydroxy: 16.8 ng/mL — ABNORMAL LOW (ref 30.0–100.0)

## 2015-04-03 LAB — FERRITIN: FERRITIN: 773 ng/mL — AB (ref 15–150)

## 2015-04-06 ENCOUNTER — Other Ambulatory Visit: Payer: Self-pay | Admitting: Family Medicine

## 2015-04-07 DIAGNOSIS — D649 Anemia, unspecified: Secondary | ICD-10-CM | POA: Diagnosis not present

## 2015-04-07 DIAGNOSIS — E875 Hyperkalemia: Secondary | ICD-10-CM | POA: Diagnosis not present

## 2015-04-07 DIAGNOSIS — N184 Chronic kidney disease, stage 4 (severe): Secondary | ICD-10-CM | POA: Diagnosis not present

## 2015-04-07 DIAGNOSIS — I10 Essential (primary) hypertension: Secondary | ICD-10-CM | POA: Diagnosis not present

## 2015-04-13 ENCOUNTER — Telehealth: Payer: Self-pay | Admitting: Family Medicine

## 2015-04-13 NOTE — Telephone Encounter (Signed)
Taking Furosemide 20-40mg  daily for lower extremity swelling.  She hasn't noticed any improvement in her edema with either the 20mg  or 40mg  doses.  Dr Sabra Heck is out of the office until 5/18. Do you have any suggestions? According to her lab results she has renal failure with a creatinine of 2.77.

## 2015-04-13 NOTE — Telephone Encounter (Signed)
The patient's nephrologist should be contacted to see what their recommendations are

## 2015-04-13 NOTE — Telephone Encounter (Signed)
Spoke with patient. She is out of her furosemide. Advised that there are refills at the pharmacy.  She should take it as prescribed. Advised her to also call her nephrologist and let them know of her symptoms. She stated understanding and agreement to plan.

## 2015-04-14 ENCOUNTER — Telehealth: Payer: Self-pay | Admitting: Cardiology

## 2015-04-14 ENCOUNTER — Other Ambulatory Visit (INDEPENDENT_AMBULATORY_CARE_PROVIDER_SITE_OTHER): Payer: Medicare Other

## 2015-04-14 DIAGNOSIS — R609 Edema, unspecified: Secondary | ICD-10-CM

## 2015-04-14 DIAGNOSIS — Z79899 Other long term (current) drug therapy: Secondary | ICD-10-CM

## 2015-04-14 NOTE — Telephone Encounter (Signed)
Spoke with Cecilie Kicks, NP. Since patient has elevated BUN/creatinine, she advised that patient contact nephrologist for advice on diuretic dose and to obtain instructions for the medication patient was prescribed OR have STAT BMET today so that cardiology can adjust medication.   Called patient and she spoke with Dr. Jerilee Hoh office.. They are not in their usual location today and do not have patient's records to review for guidance on diuretics/labs.   Patient states she can go to [facility] to have STAT BMET today. Lab ordered and faxed to PCP office.

## 2015-04-14 NOTE — Telephone Encounter (Signed)
Spoke with Dr. Percival Spanish - labs have not resulted, however he advised patient can take an extra 20mg  lasix today and we will call with lab results and see if we can adjust dose further. She was advised of this, to watch salt intake, to elevate legs, wear compression stockings if she has them. She voiced understanding.

## 2015-04-14 NOTE — Telephone Encounter (Signed)
Spoke with Tawanna Sat, [facility] heart failure case Freight forwarder. Patient has gained 6.4lbs in 3 days. Has increase LE edema and shortness of breath. She reports that patient called in to her PCP office yesterday and was informed her diuretics could not be increased r/t kidney function.

## 2015-04-14 NOTE — Progress Notes (Signed)
Lab only 

## 2015-04-14 NOTE — Telephone Encounter (Signed)
Spoke with patient - she also reports weight gain and LE edema R>L. She has not had to sleep on any extra pillows and said her breathing is OK. She did not endorse SOB. She did report she feels she needs an increase lasix dose.  She reports her nephrologist gave her a medication sodium tolystyene ...... To take once a week - she has not taken this yet and was prescribed it last Tuesday. Advised to to contact Dr. [name] about this if she is unsure how to take it or what it is for

## 2015-04-15 ENCOUNTER — Telehealth: Payer: Self-pay | Admitting: Cardiology

## 2015-04-15 ENCOUNTER — Telehealth: Payer: Self-pay | Admitting: *Deleted

## 2015-04-15 DIAGNOSIS — Z79899 Other long term (current) drug therapy: Secondary | ICD-10-CM

## 2015-04-15 LAB — BMP8+EGFR
BUN/Creatinine Ratio: 19 (ref 11–26)
BUN: 59 mg/dL — ABNORMAL HIGH (ref 8–27)
CALCIUM: 9.6 mg/dL (ref 8.7–10.3)
CO2: 21 mmol/L (ref 18–29)
Chloride: 105 mmol/L (ref 97–108)
Creatinine, Ser: 3.07 mg/dL (ref 0.57–1.00)
GFR calc Af Amer: 16 mL/min/{1.73_m2} — ABNORMAL LOW (ref >59)
GFR calc non Af Amer: 14 mL/min/{1.73_m2} — ABNORMAL LOW (ref >59)
Glucose: 132 mg/dL — ABNORMAL HIGH (ref 65–99)
POTASSIUM: 5.6 mmol/L — AB (ref 3.5–5.2)
SODIUM: 141 mmol/L (ref 134–144)

## 2015-04-15 NOTE — Telephone Encounter (Signed)
Spoke with Ovid Curd at Dr Hochrein's office to inform of BMP results

## 2015-04-15 NOTE — Telephone Encounter (Signed)
BMET ordered. Pt called, instructions given. She verbalized understanding.

## 2015-04-15 NOTE — Telephone Encounter (Signed)
This is about the same as usual.  She needs to avoid potassium containing foods.  Repeat BMET in one month.

## 2015-04-15 NOTE — Telephone Encounter (Signed)
Zigmund Daniel from [place] reporting labs Dr. Percival Spanish had ordered. Creatinine 3.07 K+ 5.6  Called due to abnormal result - noted these do appear consistent w/ her previously drawn labs.   Routing to Dr. Percival Spanish.

## 2015-04-15 NOTE — Telephone Encounter (Signed)
[name] is calling in with critical labs for the pt, that was ordered by Dr. Percival Spanish

## 2015-04-21 ENCOUNTER — Telehealth: Payer: Self-pay | Admitting: Family Medicine

## 2015-04-21 NOTE — Telephone Encounter (Signed)
We are not doing, we will write for brace if pt needs one per [facility]

## 2015-05-02 ENCOUNTER — Other Ambulatory Visit: Payer: Self-pay | Admitting: Family Medicine

## 2015-05-08 ENCOUNTER — Ambulatory Visit (INDEPENDENT_AMBULATORY_CARE_PROVIDER_SITE_OTHER): Payer: Medicare Other | Admitting: Family Medicine

## 2015-05-08 ENCOUNTER — Encounter: Payer: Self-pay | Admitting: Family Medicine

## 2015-05-08 ENCOUNTER — Telehealth: Payer: Self-pay | Admitting: Cardiology

## 2015-05-08 VITALS — BP 128/48 | HR 66 | Temp 98.0°F | Ht 64.0 in | Wt 163.0 lb

## 2015-05-08 DIAGNOSIS — I499 Cardiac arrhythmia, unspecified: Secondary | ICD-10-CM

## 2015-05-08 MED ORDER — FUROSEMIDE 40 MG PO TABS: 40.0000 mg | ORAL_TABLET | Freq: Every day | ORAL | Status: DC

## 2015-05-08 NOTE — Addendum Note (Signed)
Addended by: Ilean China on: 05/08/2015 01:44 PM   Modules accepted: Orders

## 2015-05-08 NOTE — Progress Notes (Signed)
Subjective:    Patient ID: [name], female    DOB: 1932-10-23, 81 y.o.   MRN: [id number]  HPI 81 year old female with edema and weight gain. She is referred by [place] healthcare nurse for the weight gain. Historically, she has run out of her Lasix which she had been taking twice a day for edema. Last dose of Lasix was one week ago. She denies shortness of breath chest pain or palpitations.  Patient Active Problem List   Diagnosis Date Noted  . Rhabdomyolysis 03/21/2014  . Benign paroxysmal positional vertigo 03/21/2014  . Avulsion fracture of distal phalanx of finger 03/17/2014  . HTN (hypertension) 08/09/2013  . Palpitations 06/21/2013  . Lumbar spondylosis 05/16/2013  . Hyponatremia 05/15/2013  . Overweight 05/15/2013  . Acute renal insufficiency 05/15/2013  . Perianal abscess 05/14/2013  . Heat exhaustion 05/14/2013  . Fall 05/14/2013  . Medication management 03/22/2013  . Varicose veins of lower extremities with other complications 11/91/4782  . Swelling of foot joint 03/01/2013  . Osteoarthritis 03/01/2013  . Chronic anemia 10/29/2012  . Hypoglycemia 10/29/2012  . Acute diastolic congestive heart failure 10/29/2012  . NSVT (nonsustained ventricular tachycardia) 10/28/2012  . OA (osteoarthritis) 10/27/2012  . Acute renal failure 10/26/2012  . Unstable angina 10/24/2012  . Chest pain 10/21/2012  . Hypertension 10/26/2011  . DM (diabetes mellitus) 03/30/2011  . CKD (chronic kidney disease) stage 4, GFR 15-29 ml/min 03/30/2011  . HOCM 09/15/2010  . DIASTOLIC HEART FAILURE, CHRONIC 07/20/2010  . CARDIAC MURMUR, SYSTOLIC 95/62/1308  . Cerebrovascular disease 03/09/2010  . Hyperlipidemia 12/05/2008  . Arteriosclerotic cardiovascular disease (ASCVD) 12/05/2008   Outpatient Encounter Prescriptions as of 05/08/2015  Medication Sig  . ACCU-CHEK AVIVA PLUS test strip USE TO CHECK BLOOD SUGAR THREE TIMES A DAY AS INSTRUCTED  . amLODipine (NORVASC) 10 MG tablet TAKE ONE  TABLET BY MOUTH ONE TIME DAILY  . aspirin EC 81 MG tablet Take 81 mg by mouth daily.  . cloNIDine (CATAPRES) 0.2 MG tablet Take 1 tablet (0.2 mg total) by mouth 2 (two) times daily.  . fenofibrate 54 MG tablet TAKE 1 TABLET DAILY  . furosemide (LASIX) 20 MG tablet TAKE ONE TABLET BY MOUTH ONE TIME DAILY  . insulin NPH Human (HUMULIN N) 100 UNIT/ML injection Inject 0.25 mLs (25 Units total) into the skin 2 (two) times daily.  . insulin NPH-regular Human (NOVOLIN 70/30) (70-30) 100 UNIT/ML injection INJECT 25 UNITS TWICE A DAY EVERY MORNING AND EVENING  . isosorbide mononitrate (IMDUR) 120 MG 24 hr tablet TAKE ONE TABLET BY MOUTH ONE TIME DAILY  . labetalol (NORMODYNE) 200 MG tablet Take 200 mg by mouth 2 (two) times daily.  Marland Kitchen lisinopril (PRINIVIL,ZESTRIL) 40 MG tablet Take 40 mg by mouth daily.  . nitroGLYCERIN (NITROSTAT) 0.4 MG SL tablet Place 1 tablet (0.4 mg total) under the tongue every 5 (five) minutes x 3 doses as needed for chest pain.  Marland Kitchen omeprazole (PRILOSEC) 20 MG capsule Take 20 mg by mouth daily.  . [DISCONTINUED] traMADol (ULTRAM) 50 MG tablet Take 1 tablet (50 mg total) by mouth every 8 (eight) hours as needed.   No facility-administered encounter medications on file as of 05/08/2015.       Review of Systems  Constitutional: Positive for unexpected weight change.  Respiratory: Negative.   Cardiovascular: Positive for leg swelling.  Neurological: Negative.   Psychiatric/Behavioral: Negative.        Objective:   Physical Exam  Constitutional: She is oriented to person, place, and time.  She appears well-developed and well-nourished.  Cardiovascular: Normal rate.   Heart sounds are very irregular and EKG does demonstrate a bigeminal rhythm  Pulmonary/Chest: Effort normal and breath sounds normal.  Musculoskeletal: She exhibits edema.  Neurological: She is alert and oriented to person, place, and time.    BP 128/48 mmHg  Pulse 66  Temp(Src) 98 F (36.7 C) (Oral)  Ht  $R'5\' 4"'cT$  (1.626 m)  Wt 163 lb (73.936 kg)  BMI 27.97 kg/m2        Assessment & Plan:  1. Irregular heart beat Bigeminal rhythm which to me is new. Review of several past EKGs shows normal sinus rhythm without PVCs. Patient tolerates this well. There is a wide pulse pressure today but otherwise normal.  For her edema and weight gain I will reinstitute Lasix but rather than take 20 mg twice a day we'll use 40 mg every morning. We'll also check lab today to look at her renal function and electrolytes - BMP8+EGFR - EKG 12-Lead

## 2015-05-08 NOTE — Telephone Encounter (Signed)
Dr. Ammie Ferrier office called to notify of EKG performed today. I looked, today's EKG appears to report ventricular bigeminy/freq PVCs - this is new from March.  Pt not having any acute problems, plan to draw blood and let her go home. In absence of concerns by patient, advised OK to send home. Can reach out to pt if indicated.  Will print out EKGs for comparison, defer to Dr. Percival Spanish for interpretation, further advice.

## 2015-05-08 NOTE — Telephone Encounter (Signed)
Bigeminy one EKG but no symptoms.

## 2015-05-08 NOTE — Telephone Encounter (Signed)
Dr. Percival Spanish advised OK at review of strips.  Fam Med RN requested call only if instruction/recommendation. Will close encounter.

## 2015-05-09 ENCOUNTER — Inpatient Hospital Stay (HOSPITAL_COMMUNITY)
Admission: EM | Admit: 2015-05-09 | Discharge: 2015-05-14 | DRG: 302 | Disposition: A | Discharge status: Home Health | Payer: Medicare Other | Attending: Cardiovascular Disease | Admitting: Cardiovascular Disease

## 2015-05-09 ENCOUNTER — Emergency Department (HOSPITAL_COMMUNITY): Payer: Medicare Other

## 2015-05-09 ENCOUNTER — Encounter (HOSPITAL_COMMUNITY): Payer: Self-pay

## 2015-05-09 ENCOUNTER — Other Ambulatory Visit (HOSPITAL_COMMUNITY): Payer: Self-pay

## 2015-05-09 DIAGNOSIS — Z794 Long term (current) use of insulin: Secondary | ICD-10-CM | POA: Diagnosis not present

## 2015-05-09 DIAGNOSIS — I209 Angina pectoris, unspecified: Secondary | ICD-10-CM | POA: Diagnosis not present

## 2015-05-09 DIAGNOSIS — E10649 Type 1 diabetes mellitus with hypoglycemia without coma: Secondary | ICD-10-CM | POA: Diagnosis present

## 2015-05-09 DIAGNOSIS — I2510 Atherosclerotic heart disease of native coronary artery without angina pectoris: Secondary | ICD-10-CM | POA: Diagnosis present

## 2015-05-09 DIAGNOSIS — Z7982 Long term (current) use of aspirin: Secondary | ICD-10-CM | POA: Diagnosis not present

## 2015-05-09 DIAGNOSIS — I208 Other forms of angina pectoris: Secondary | ICD-10-CM | POA: Diagnosis not present

## 2015-05-09 DIAGNOSIS — I5031 Acute diastolic (congestive) heart failure: Secondary | ICD-10-CM | POA: Diagnosis not present

## 2015-05-09 DIAGNOSIS — E875 Hyperkalemia: Secondary | ICD-10-CM | POA: Diagnosis not present

## 2015-05-09 DIAGNOSIS — J209 Acute bronchitis, unspecified: Secondary | ICD-10-CM | POA: Diagnosis not present

## 2015-05-09 DIAGNOSIS — I25110 Atherosclerotic heart disease of native coronary artery with unstable angina pectoris: Secondary | ICD-10-CM | POA: Diagnosis not present

## 2015-05-09 DIAGNOSIS — N390 Urinary tract infection, site not specified: Secondary | ICD-10-CM | POA: Diagnosis not present

## 2015-05-09 DIAGNOSIS — N184 Chronic kidney disease, stage 4 (severe): Secondary | ICD-10-CM | POA: Diagnosis present

## 2015-05-09 DIAGNOSIS — Z8673 Personal history of transient ischemic attack (TIA), and cerebral infarction without residual deficits: Secondary | ICD-10-CM

## 2015-05-09 DIAGNOSIS — I272 Other secondary pulmonary hypertension: Secondary | ICD-10-CM | POA: Diagnosis present

## 2015-05-09 DIAGNOSIS — E785 Hyperlipidemia, unspecified: Secondary | ICD-10-CM | POA: Diagnosis not present

## 2015-05-09 DIAGNOSIS — IMO0001 Reserved for inherently not codable concepts without codable children: Secondary | ICD-10-CM | POA: Diagnosis present

## 2015-05-09 DIAGNOSIS — J208 Acute bronchitis due to other specified organisms: Secondary | ICD-10-CM | POA: Diagnosis present

## 2015-05-09 DIAGNOSIS — K5909 Other constipation: Secondary | ICD-10-CM | POA: Diagnosis present

## 2015-05-09 DIAGNOSIS — Z9861 Coronary angioplasty status: Secondary | ICD-10-CM

## 2015-05-09 DIAGNOSIS — R627 Adult failure to thrive: Secondary | ICD-10-CM | POA: Diagnosis not present

## 2015-05-09 DIAGNOSIS — B961 Klebsiella pneumoniae [K. pneumoniae] as the cause of diseases classified elsewhere: Secondary | ICD-10-CM | POA: Diagnosis not present

## 2015-05-09 DIAGNOSIS — I200 Unstable angina: Secondary | ICD-10-CM | POA: Diagnosis present

## 2015-05-09 DIAGNOSIS — E118 Type 2 diabetes mellitus with unspecified complications: Secondary | ICD-10-CM

## 2015-05-09 DIAGNOSIS — R079 Chest pain, unspecified: Secondary | ICD-10-CM | POA: Diagnosis not present

## 2015-05-09 DIAGNOSIS — I5033 Acute on chronic diastolic (congestive) heart failure: Secondary | ICD-10-CM | POA: Diagnosis not present

## 2015-05-09 DIAGNOSIS — I5032 Chronic diastolic (congestive) heart failure: Secondary | ICD-10-CM | POA: Diagnosis not present

## 2015-05-09 DIAGNOSIS — D631 Anemia in chronic kidney disease: Secondary | ICD-10-CM | POA: Diagnosis present

## 2015-05-09 DIAGNOSIS — I10 Essential (primary) hypertension: Secondary | ICD-10-CM | POA: Diagnosis present

## 2015-05-09 DIAGNOSIS — K5900 Constipation, unspecified: Secondary | ICD-10-CM | POA: Diagnosis not present

## 2015-05-09 DIAGNOSIS — Z9114 Patient's other noncompliance with medication regimen: Secondary | ICD-10-CM | POA: Diagnosis not present

## 2015-05-09 DIAGNOSIS — N189 Chronic kidney disease, unspecified: Secondary | ICD-10-CM | POA: Diagnosis not present

## 2015-05-09 DIAGNOSIS — E1065 Type 1 diabetes mellitus with hyperglycemia: Secondary | ICD-10-CM | POA: Diagnosis not present

## 2015-05-09 DIAGNOSIS — R8290 Unspecified abnormal findings in urine: Secondary | ICD-10-CM | POA: Diagnosis not present

## 2015-05-09 DIAGNOSIS — E1165 Type 2 diabetes mellitus with hyperglycemia: Secondary | ICD-10-CM | POA: Diagnosis not present

## 2015-05-09 DIAGNOSIS — I129 Hypertensive chronic kidney disease with stage 1 through stage 4 chronic kidney disease, or unspecified chronic kidney disease: Secondary | ICD-10-CM | POA: Diagnosis present

## 2015-05-09 DIAGNOSIS — I2089 Other forms of angina pectoris: Secondary | ICD-10-CM

## 2015-05-09 LAB — TROPONIN I: Troponin I: 0.03 ng/mL (ref <0.031)

## 2015-05-09 LAB — BASIC METABOLIC PANEL
Anion gap: 11 (ref 5–15)
BUN: 50 mg/dL — AB (ref 6–20)
CHLORIDE: 107 mmol/L (ref 101–111)
CO2: 20 mmol/L — ABNORMAL LOW (ref 22–32)
Calcium: 10.1 mg/dL (ref 8.9–10.3)
Creatinine, Ser: 2.38 mg/dL — ABNORMAL HIGH (ref 0.44–1.00)
GFR calc Af Amer: 21 mL/min — ABNORMAL LOW (ref >60)
GFR, EST NON AFRICAN AMERICAN: 18 mL/min — AB (ref >60)
GLUCOSE: 215 mg/dL — AB (ref 65–99)
Potassium: 4.5 mmol/L (ref 3.5–5.1)
SODIUM: 138 mmol/L (ref 135–145)

## 2015-05-09 LAB — I-STAT TROPONIN, ED: TROPONIN I, POC: 0.03 ng/mL (ref 0.00–0.08)

## 2015-05-09 LAB — BMP8+EGFR
BUN / CREAT RATIO: 21 (ref 11–26)
BUN: 53 mg/dL — ABNORMAL HIGH (ref 8–27)
CALCIUM: 9.6 mg/dL (ref 8.7–10.3)
CHLORIDE: 109 mmol/L — AB (ref 97–108)
CO2: 17 mmol/L — ABNORMAL LOW (ref 18–29)
Creatinine, Ser: 2.5 mg/dL — ABNORMAL HIGH (ref 0.57–1.00)
GFR calc Af Amer: 20 mL/min/{1.73_m2} — ABNORMAL LOW (ref >59)
GFR calc non Af Amer: 18 mL/min/{1.73_m2} — ABNORMAL LOW (ref >59)
GLUCOSE: 226 mg/dL — AB (ref 65–99)
POTASSIUM: 5.1 mmol/L (ref 3.5–5.2)
SODIUM: 141 mmol/L (ref 134–144)

## 2015-05-09 LAB — CBC
HEMATOCRIT: 29.9 % — AB (ref 36.0–46.0)
HEMOGLOBIN: 9.9 g/dL — AB (ref 12.0–15.0)
MCH: 30.6 pg (ref 26.0–34.0)
MCHC: 33.1 g/dL (ref 30.0–36.0)
MCV: 92.3 fL (ref 78.0–100.0)
Platelets: 188 10*3/uL (ref 150–400)
RBC: 3.24 MIL/uL — AB (ref 3.87–5.11)
RDW: 13.1 % (ref 11.5–15.5)
WBC: 8.2 10*3/uL (ref 4.0–10.5)

## 2015-05-09 LAB — I-STAT CHEM 8, ED
BUN: 48 mg/dL — AB (ref 6–20)
CREATININE: 2.6 mg/dL — AB (ref 0.44–1.00)
Calcium, Ion: 1.31 mmol/L — ABNORMAL HIGH (ref 1.13–1.30)
Chloride: 111 mmol/L (ref 101–111)
Glucose, Bld: 214 mg/dL — ABNORMAL HIGH (ref 65–99)
HCT: 27 % — ABNORMAL LOW (ref 36.0–46.0)
HEMOGLOBIN: 9.2 g/dL — AB (ref 12.0–15.0)
POTASSIUM: 4.6 mmol/L (ref 3.5–5.1)
Sodium: 139 mmol/L (ref 135–145)
TCO2: 17 mmol/L (ref 0–100)

## 2015-05-09 LAB — PROTIME-INR
INR: 1.06 (ref 0.00–1.49)
Prothrombin Time: 14 seconds (ref 11.6–15.2)

## 2015-05-09 LAB — BRAIN NATRIURETIC PEPTIDE: B Natriuretic Peptide: 842 pg/mL — ABNORMAL HIGH (ref 0.0–100.0)

## 2015-05-09 MED ORDER — NITROGLYCERIN 0.4 MG SL SUBL
SUBLINGUAL_TABLET | SUBLINGUAL | Status: AC
  Filled 2015-05-09: qty 1

## 2015-05-09 MED ORDER — NITROGLYCERIN IN D5W 200-5 MCG/ML-% IV SOLN
5.0000 ug/min | INTRAVENOUS | Status: DC
  Administered 2015-05-09: 5 ug/min via INTRAVENOUS

## 2015-05-09 MED ORDER — MORPHINE SULFATE 2 MG/ML IJ SOLN
INTRAMUSCULAR | Status: AC
  Filled 2015-05-09: qty 1

## 2015-05-09 MED ORDER — MORPHINE SULFATE 2 MG/ML IJ SOLN
2.0000 mg | Freq: Once | INTRAMUSCULAR | Status: AC
  Administered 2015-05-09: 2 mg via INTRAVENOUS

## 2015-05-09 MED ORDER — ASPIRIN 81 MG PO CHEW
CHEWABLE_TABLET | ORAL | Status: AC
Start: 2015-05-09 — End: 2015-05-10
  Filled 2015-05-09: qty 4

## 2015-05-09 MED ORDER — ASPIRIN 81 MG PO CHEW
324.0000 mg | CHEWABLE_TABLET | Freq: Once | ORAL | Status: AC
  Administered 2015-05-09: 324 mg via ORAL

## 2015-05-09 MED ORDER — NITROGLYCERIN 2 % TD OINT
1.0000 [in_us] | TOPICAL_OINTMENT | Freq: Once | TRANSDERMAL | Status: AC
  Administered 2015-05-09: 1 [in_us] via TOPICAL
  Filled 2015-05-09: qty 1

## 2015-05-09 MED ORDER — NITROGLYCERIN 0.4 MG SL SUBL
0.4000 mg | SUBLINGUAL_TABLET | SUBLINGUAL | Status: DC | PRN
  Administered 2015-05-09 – 2015-05-12 (×2): 0.4 mg via SUBLINGUAL
  Filled 2015-05-09: qty 1

## 2015-05-09 MED ORDER — NITROGLYCERIN IN D5W 200-5 MCG/ML-% IV SOLN
INTRAVENOUS | Status: AC
  Administered 2015-05-09: 5 ug/min via INTRAVENOUS
  Filled 2015-05-09: qty 250

## 2015-05-09 MED ORDER — MORPHINE SULFATE 2 MG/ML IJ SOLN
2.0000 mg | Freq: Once | INTRAMUSCULAR | Status: AC
  Administered 2015-05-09: 2 mg via INTRAVENOUS
  Filled 2015-05-09: qty 1

## 2015-05-09 NOTE — ED Notes (Signed)
Report given to [facility]. 

## 2015-05-09 NOTE — ED Notes (Signed)
MD at bedside. 

## 2015-05-09 NOTE — ED Provider Notes (Signed)
CSN: [id number]     Arrival date & time 05/09/15  2054 History   This chart was scribed for Milton Ferguson, MD by Randa Evens, ED Scribe. This patient was seen in room APA06/[facility]06 and the patient's care was started at 9:10 PM.     Chief Complaint  Patient presents with  . Chest Pain   Patient is a 81 y.o. female presenting with chest pain. The history is provided by the patient. No language interpreter was used.  Chest Pain Pain location:  Substernal area Pain radiates to:  Does not radiate Pain radiates to the back: no   Pain severity:  Mild Duration:  1 day Progression:  Unchanged Relieved by:  None tried Worsened by:  Coughing Ineffective treatments:  None tried Associated symptoms: cough and shortness of breath   Associated symptoms: no abdominal pain, no back pain, no diaphoresis, no fatigue, no headache and no nausea   Risk factors: coronary artery disease, diabetes mellitus, high cholesterol and hypertension    HPI Comments: [name] is a 81 y.o. female who presents to the Emergency Department complaining of CP onset 1 day prior. Pt presents with cough and SOB. Pt also presents with lower leg edema. Denies diaphoresis or nausea.    Past Medical History  Diagnosis Date  . CAD (coronary artery disease) 2003    Last catheterization 2008. Two-vessel PCI of the first OM branch and mid LAD.  Marland Kitchen CVA (cerebral vascular accident) 1996    LEFT SIDE   . IDDM (insulin dependent diabetes mellitus)   . CKD (chronic kidney disease)   . Myocardial infarction   . Congestive heart failure   . Hypercholesteremia   . Hypertension   . Dementia   . DJD (degenerative joint disease) of lumbar spine    Past Surgical History  Procedure Laterality Date  . Appendectomy    . Knee surgery     Family History  Problem Relation Age of Onset  . Diabetes Brother     RETINOPATHY   . Drug abuse Brother   . Liver cancer Brother   . Coronary artery disease Mother   . Breast cancer  Daughter   . Heart attack Mother   . Heart attack Father   . Coronary artery disease Father   . Diabetes Sister    History  Substance Use Topics  . Smoking status: Never Smoker   . Smokeless tobacco: Never Used  . Alcohol Use: No   OB History    No data available     Review of Systems  Constitutional: Negative for diaphoresis, appetite change and fatigue.  HENT: Negative for congestion, ear discharge and sinus pressure.   Eyes: Negative for discharge.  Respiratory: Positive for cough and shortness of breath.   Cardiovascular: Positive for chest pain and leg swelling.  Gastrointestinal: Negative for nausea, abdominal pain and diarrhea.  Genitourinary: Negative for frequency and hematuria.  Musculoskeletal: Negative for back pain.  Skin: Negative for rash.  Neurological: Negative for seizures and headaches.  Psychiatric/Behavioral: Negative for hallucinations.  All other systems reviewed and are negative.    Allergies  Propoxyphene n-acetaminophen and Simvastatin  Home Medications   Prior to Admission medications   Medication Sig Start Date End Date Taking? Authorizing Provider  furosemide (LASIX) 40 MG tablet Take 1 tablet (40 mg total) by mouth daily. 05/08/15  Yes Wardell Honour, MD  insulin NPH Human (HUMULIN N) 100 UNIT/ML injection Inject 0.25 mLs (25 Units total) into the skin 2 (two)  times daily. 12/26/14  Yes Wardell Honour, MD  insulin NPH-regular Human (NOVOLIN 70/30) (70-30) 100 UNIT/ML injection INJECT 25 UNITS TWICE A DAY EVERY MORNING AND EVENING 04/18/14  Yes Chipper Herb, MD  nitroGLYCERIN (NITROSTAT) 0.4 MG SL tablet Place 1 tablet (0.4 mg total) under the tongue every 5 (five) minutes x 3 doses as needed for chest pain. 10/08/14  Yes Minus Breeding, MD  ACCU-CHEK AVIVA PLUS test strip USE TO CHECK BLOOD SUGAR THREE TIMES A DAY AS INSTRUCTED 05/04/15   Chipper Herb, MD  amLODipine (NORVASC) 10 MG tablet TAKE ONE TABLET BY MOUTH ONE TIME DAILY 01/02/15    Wardell Honour, MD  aspirin EC 81 MG tablet Take 81 mg by mouth daily.    Historical Provider, MD  cloNIDine (CATAPRES) 0.2 MG tablet Take 1 tablet (0.2 mg total) by mouth 2 (two) times daily. 01/28/15   Sharion Balloon, FNP  fenofibrate 54 MG tablet TAKE 1 TABLET DAILY 08/29/13   [name], MD  furosemide (LASIX) 20 MG tablet TAKE ONE TABLET BY MOUTH ONE TIME DAILY 03/19/15   Wardell Honour, MD  isosorbide mononitrate (IMDUR) 120 MG 24 hr tablet TAKE ONE TABLET BY MOUTH ONE TIME DAILY 04/07/15   Wardell Honour, MD  labetalol (NORMODYNE) 200 MG tablet Take 200 mg by mouth 2 (two) times daily.    Historical Provider, MD  lisinopril (PRINIVIL,ZESTRIL) 40 MG tablet Take 40 mg by mouth daily.    Historical Provider, MD  omeprazole (PRILOSEC) 20 MG capsule Take 20 mg by mouth daily.    Historical Provider, MD  ranitidine (ZANTAC) 150 MG tablet  05/02/15   Historical Provider, MD  sodium polystyrene (KAYEXALATE) 15 GM/60ML suspension  04/07/15   Historical Provider, MD   BP 190/60 mmHg  Pulse 89  Temp(Src) 99.4 F (37.4 C) (Oral)  Resp 17  Ht 5\' 4"  (1.626 m)  Wt 155 lb (70.308 kg)  BMI 26.59 kg/m2  SpO2 96%   Physical Exam  Constitutional: She is oriented to person, place, and time. She appears well-developed.  HENT:  Head: Normocephalic.  Eyes: Conjunctivae and EOM are normal. No scleral icterus.  Neck: Neck supple. No thyromegaly present.  Cardiovascular: Normal rate and regular rhythm.  Exam reveals no gallop and no friction rub.   No murmur heard. Pulmonary/Chest: No stridor. She has wheezes. She has no rales. She exhibits no tenderness.  Mild wheezing bilaterally.  Abdominal: She exhibits no distension. There is no tenderness. There is no rebound.  Musculoskeletal: Normal range of motion. She exhibits edema.  2+ edema lower legs.   Lymphadenopathy:    She has no cervical adenopathy.  Neurological: She is oriented to person, place, and time. She exhibits normal muscle tone.  Coordination normal.  Skin: No rash noted. No erythema.  Psychiatric: She has a normal mood and affect. Her behavior is normal.    ED Course  Procedures (including critical care time) DIAGNOSTIC STUDIES: Oxygen Saturation is 97% on RA, normal by my interpretation.    COORDINATION OF CARE: 9:18 PM-Discussed treatment plan with pt at bedside and pt agreed to plan.     Labs Review Labs Reviewed  CBC - Abnormal; Notable for the following:    RBC 3.24 (*)    Hemoglobin 9.9 (*)    HCT 29.9 (*)    All other components within normal limits  BASIC METABOLIC PANEL - Abnormal; Notable for the following:    CO2 20 (*)  Glucose, Bld 215 (*)    BUN 50 (*)    Creatinine, Ser 2.38 (*)    GFR calc non Af Amer 18 (*)    GFR calc Af Amer 21 (*)    All other components within normal limits  BRAIN NATRIURETIC PEPTIDE - Abnormal; Notable for the following:    B Natriuretic Peptide 842.0 (*)    All other components within normal limits  I-STAT CHEM 8, ED - Abnormal; Notable for the following:    BUN 48 (*)    Creatinine, Ser 2.60 (*)    Glucose, Bld 214 (*)    Calcium, Ion 1.31 (*)    Hemoglobin 9.2 (*)    HCT 27.0 (*)    All other components within normal limits  TROPONIN I  PROTIME-INR  I-STAT TROPOININ, ED    Imaging Review Dg Chest Port 1 View  05/09/2015   CLINICAL DATA:  Midsternal chest pain, onset yesterday. Dyspnea and weakness.  EXAM: PORTABLE CHEST - 1 VIEW  COMPARISON:  01/04/2015  FINDINGS: There is mild unchanged cardiomegaly and aortic tortuosity. There are mild vascular and interstitial congestive changes consistent with mild CHF. There is no confluent airspace consolidation. There is no large effusion.  IMPRESSION: Mild vascular and interstitial congestive changes.   Electronically Signed   By: Andreas Newport M.D.   On: 05/09/2015 21:59     EKG Interpretation   Date/Time:  Saturday May 09 2015 21:01:07 EDT Ventricular Rate:  106 PR Interval:  190 QRS Duration:  123 QT Interval:  353 QTC Calculation: 469 R Axis:   38 Text Interpretation:  Sinus tachycardia LVH with secondary repolarization  abnormality Anterior Q waves, possibly due to LVH ST depression, consider  ischemia, diffuse lds Baseline wander in lead(s) I Confirmed by ZACKOWSKI   MD, SCOTT ([id number]) on 05/09/2015 9:08:22 PM      MDM   Final diagnoses:  None   CRITICAL CARE Performed by: Amilcar Reever L Total critical care time: 45 Critical care time was exclusive of separately billable procedures and treating other patients. Critical care was necessary to treat or prevent imminent or life-threatening deterioration. Critical care was time spent personally by me on the following activities: development of treatment plan with patient and/or surrogate as well as nursing, discussions with consultants, evaluation of patient's response to treatment, examination of patient, obtaining history from patient or surrogate, ordering and performing treatments and interventions, ordering and review of laboratory studies, ordering and review of radiographic studies, pulse oximetry and re-evaluation of patient's condition.    Admit to cards at [facility].    Milton Ferguson, MD 05/09/15 2255

## 2015-05-09 NOTE — ED Notes (Signed)
Complaining of central chest pain that started yesterday. Shortness of breath and weakness.

## 2015-05-10 ENCOUNTER — Encounter (HOSPITAL_COMMUNITY): Payer: Self-pay | Admitting: *Deleted

## 2015-05-10 DIAGNOSIS — I2510 Atherosclerotic heart disease of native coronary artery without angina pectoris: Secondary | ICD-10-CM

## 2015-05-10 DIAGNOSIS — I10 Essential (primary) hypertension: Secondary | ICD-10-CM

## 2015-05-10 DIAGNOSIS — I208 Other forms of angina pectoris: Secondary | ICD-10-CM

## 2015-05-10 LAB — TROPONIN I
TROPONIN I: 0.07 ng/mL — AB (ref <0.031)
Troponin I: 0.08 ng/mL — ABNORMAL HIGH (ref <0.031)
Troponin I: 0.08 ng/mL — ABNORMAL HIGH (ref <0.031)

## 2015-05-10 LAB — URINALYSIS, ROUTINE W REFLEX MICROSCOPIC
Bilirubin Urine: NEGATIVE
Glucose, UA: NEGATIVE mg/dL
Ketones, ur: NEGATIVE mg/dL
Nitrite: NEGATIVE
Protein, ur: >300 mg/dL — AB
Specific Gravity, Urine: 1.012 (ref 1.005–1.030)
Urobilinogen, UA: 0.2 mg/dL (ref 0.0–1.0)
pH: 5 (ref 5.0–8.0)

## 2015-05-10 LAB — URINE MICROSCOPIC-ADD ON

## 2015-05-10 LAB — GLUCOSE, CAPILLARY
GLUCOSE-CAPILLARY: 196 mg/dL — AB (ref 65–99)
GLUCOSE-CAPILLARY: 141 mg/dL — AB (ref 65–99)
GLUCOSE-CAPILLARY: 36 mg/dL — AB (ref 65–99)
GLUCOSE-CAPILLARY: 147 mg/dL — AB (ref 65–99)
Glucose-Capillary: 65 mg/dL (ref 65–99)
Glucose-Capillary: 182 mg/dL — ABNORMAL HIGH (ref 65–99)

## 2015-05-10 LAB — BASIC METABOLIC PANEL
Anion gap: 8 (ref 5–15)
BUN: 44 mg/dL — ABNORMAL HIGH (ref 6–20)
CO2: 22 mmol/L (ref 22–32)
Calcium: 9.7 mg/dL (ref 8.9–10.3)
Chloride: 107 mmol/L (ref 101–111)
Creatinine, Ser: 2.42 mg/dL — ABNORMAL HIGH (ref 0.44–1.00)
GFR calc non Af Amer: 18 mL/min — ABNORMAL LOW (ref >60)
GFR, EST AFRICAN AMERICAN: 21 mL/min — AB (ref >60)
GLUCOSE: 234 mg/dL — AB (ref 65–99)
Potassium: 4.7 mmol/L (ref 3.5–5.1)
SODIUM: 137 mmol/L (ref 135–145)

## 2015-05-10 LAB — HEPARIN LEVEL (UNFRACTIONATED)
HEPARIN UNFRACTIONATED: 0.12 [IU]/mL — AB (ref 0.30–0.70)
Heparin Unfractionated: <0.1 IU/mL — ABNORMAL LOW (ref 0.30–0.70)

## 2015-05-10 LAB — TSH: TSH: 2.795 u[IU]/mL (ref 0.350–4.500)

## 2015-05-10 LAB — MRSA PCR SCREENING: MRSA by PCR: NEGATIVE

## 2015-05-10 MED ORDER — HEPARIN BOLUS VIA INFUSION
2000.0000 [IU] | Freq: Once | INTRAVENOUS | Status: AC
  Administered 2015-05-10: 2000 [IU] via INTRAVENOUS
  Filled 2015-05-10: qty 2000

## 2015-05-10 MED ORDER — HEPARIN (PORCINE) IN NACL 100-0.45 UNIT/ML-% IJ SOLN
1200.0000 [IU]/h | INTRAMUSCULAR | Status: DC
  Administered 2015-05-10: 950 [IU]/h via INTRAVENOUS
  Administered 2015-05-10: 750 [IU]/h via INTRAVENOUS
  Administered 2015-05-10: 1200 [IU]/h via INTRAVENOUS
  Filled 2015-05-10 (×4): qty 250

## 2015-05-10 MED ORDER — FENOFIBRATE 54 MG PO TABS
54.0000 mg | ORAL_TABLET | Freq: Every day | ORAL | Status: DC
  Administered 2015-05-10 – 2015-05-14 (×5): 54 mg via ORAL
  Filled 2015-05-10 (×5): qty 1

## 2015-05-10 MED ORDER — INSULIN ASPART PROT & ASPART (70-30 MIX) 100 UNIT/ML ~~LOC~~ SUSP: 25.0000 [IU] | Freq: Two times a day (BID) | SUBCUTANEOUS | Status: DC

## 2015-05-10 MED ORDER — MORPHINE SULFATE 2 MG/ML IJ SOLN
2.0000 mg | Freq: Once | INTRAMUSCULAR | Status: AC
  Administered 2015-05-10: 2 mg via INTRAVENOUS
  Filled 2015-05-10: qty 1

## 2015-05-10 MED ORDER — AMLODIPINE BESYLATE 10 MG PO TABS
10.0000 mg | ORAL_TABLET | Freq: Every day | ORAL | Status: DC
  Administered 2015-05-10 – 2015-05-14 (×5): 10 mg via ORAL
  Filled 2015-05-10 (×5): qty 1

## 2015-05-10 MED ORDER — ASPIRIN EC 81 MG PO TBEC: 81.0000 mg | DELAYED_RELEASE_TABLET | Freq: Every day | ORAL | Status: DC

## 2015-05-10 MED ORDER — DEXTROSE 50 % IV SOLN
INTRAVENOUS | Status: AC
  Administered 2015-05-10: 25 mL
  Filled 2015-05-10: qty 50

## 2015-05-10 MED ORDER — NITROGLYCERIN IN D5W 200-5 MCG/ML-% IV SOLN
3.0000 ug/min | INTRAVENOUS | Status: DC
  Administered 2015-05-10: 30 ug/min via INTRAVENOUS
  Filled 2015-05-10: qty 250

## 2015-05-10 MED ORDER — INSULIN ASPART 100 UNIT/ML ~~LOC~~ SOLN
0.0000 [IU] | Freq: Three times a day (TID) | SUBCUTANEOUS | Status: DC
  Administered 2015-05-10: 3 [IU] via SUBCUTANEOUS
  Administered 2015-05-10 – 2015-05-11 (×2): 2 [IU] via SUBCUTANEOUS
  Administered 2015-05-11: 7 [IU] via SUBCUTANEOUS
  Administered 2015-05-12 – 2015-05-13 (×5): 11 [IU] via SUBCUTANEOUS
  Administered 2015-05-13 – 2015-05-14 (×2): 8 [IU] via SUBCUTANEOUS

## 2015-05-10 MED ORDER — FAMOTIDINE 10 MG PO TABS
10.0000 mg | ORAL_TABLET | Freq: Every day | ORAL | Status: DC
  Administered 2015-05-10 – 2015-05-14 (×5): 10 mg via ORAL
  Filled 2015-05-10 (×5): qty 1

## 2015-05-10 MED ORDER — CLONIDINE HCL 0.2 MG PO TABS
0.2000 mg | ORAL_TABLET | Freq: Two times a day (BID) | ORAL | Status: DC
  Administered 2015-05-10 – 2015-05-14 (×10): 0.2 mg via ORAL
  Filled 2015-05-10 (×11): qty 1

## 2015-05-10 MED ORDER — INSULIN ASPART PROT & ASPART (70-30 MIX) 100 UNIT/ML ~~LOC~~ SUSP
9.0000 [IU] | Freq: Once | SUBCUTANEOUS | Status: AC
  Administered 2015-05-10: 9 [IU] via SUBCUTANEOUS
  Filled 2015-05-10: qty 10

## 2015-05-10 MED ORDER — ASPIRIN EC 81 MG PO TBEC
81.0000 mg | DELAYED_RELEASE_TABLET | Freq: Every day | ORAL | Status: DC
  Administered 2015-05-10 – 2015-05-14 (×5): 81 mg via ORAL
  Filled 2015-05-10 (×5): qty 1

## 2015-05-10 MED ORDER — HEPARIN BOLUS VIA INFUSION
3000.0000 [IU] | Freq: Once | INTRAVENOUS | Status: AC
  Administered 2015-05-10: 3000 [IU] via INTRAVENOUS
  Filled 2015-05-10: qty 3000

## 2015-05-10 MED ORDER — ONDANSETRON HCL 4 MG/2ML IJ SOLN: 4.0000 mg | Freq: Four times a day (QID) | INTRAMUSCULAR | Status: DC | PRN

## 2015-05-10 MED ORDER — LABETALOL HCL 5 MG/ML IV SOLN
INTRAVENOUS | Status: AC
  Administered 2015-05-10: 10 mg
  Filled 2015-05-10: qty 4

## 2015-05-10 MED ORDER — INSULIN ASPART PROT & ASPART (70-30 MIX) 100 UNIT/ML ~~LOC~~ SUSP
18.0000 [IU] | Freq: Every day | SUBCUTANEOUS | Status: DC
  Filled 2015-05-10: qty 10

## 2015-05-10 MED ORDER — FUROSEMIDE 10 MG/ML IJ SOLN
20.0000 mg | Freq: Once | INTRAMUSCULAR | Status: AC
  Administered 2015-05-10: 20 mg via INTRAVENOUS
  Filled 2015-05-10: qty 2

## 2015-05-10 MED ORDER — INSULIN ASPART PROT & ASPART (70-30 MIX) 100 UNIT/ML ~~LOC~~ SUSP
25.0000 [IU] | Freq: Every day | SUBCUTANEOUS | Status: DC
  Administered 2015-05-10 – 2015-05-11 (×2): 25 [IU] via SUBCUTANEOUS
  Filled 2015-05-10: qty 10

## 2015-05-10 MED ORDER — HYDRALAZINE HCL 20 MG/ML IJ SOLN: 10.0000 mg | INTRAMUSCULAR | Status: DC | PRN

## 2015-05-10 MED ORDER — FUROSEMIDE 40 MG PO TABS
40.0000 mg | ORAL_TABLET | Freq: Every day | ORAL | Status: DC
  Administered 2015-05-11: 40 mg via ORAL
  Filled 2015-05-10: qty 1

## 2015-05-10 MED ORDER — LABETALOL HCL 200 MG PO TABS
200.0000 mg | ORAL_TABLET | Freq: Two times a day (BID) | ORAL | Status: DC
  Administered 2015-05-10 – 2015-05-14 (×8): 200 mg via ORAL
  Filled 2015-05-10 (×11): qty 1

## 2015-05-10 NOTE — Progress Notes (Signed)
ANTICOAGULATION CONSULT NOTE - Initial Consult  Pharmacy Consult for Heparin  Indication: chest pain/ACS  Allergies  Allergen Reactions  . Propoxyphene N-Acetaminophen   . Simvastatin Other (See Comments)    headache    Patient Measurements: Height: 5\' 4"  (162.6 cm) Weight: 155 lb 6.8 oz (70.5 kg) IBW/kg (Calculated) : 54.7  Vital Signs: Temp: 97.7 F (36.5 C) (06/12 2027) Temp Source: Oral (06/12 2027) BP: 143/37 mmHg (06/12 1800) Pulse Rate: 59 (06/12 1800)  Labs:  Recent Labs  05/09/15 2112 05/09/15 2144 05/10/15 0335 05/10/15 0958 05/10/15 1058 05/10/15 1450 05/10/15 2110  HGB 9.9* 9.2*  --   --   --   --   --   HCT 29.9* 27.0*  --   --   --   --   --   PLT 188  --   --   --   --   --   --   LABPROT 14.0  --   --   --   --   --   --   INR 1.06  --   --   --   --   --   --   HEPARINUNFRC  --   --   --   --  <0.10*  --  0.12*  CREATININE 2.38* 2.60* 2.42*  --   --   --   --   TROPONINI 0.03  --  0.08* 0.08*  --  0.07*  --     Estimated Creatinine Clearance: 17.9 mL/min (by C-G formula based on Cr of 2.42).   Medical History: Past Medical History  Diagnosis Date  . CAD (coronary artery disease) 2003    Last catheterization 2008. Two-vessel PCI of the first OM branch and mid LAD.  Marland Kitchen CVA (cerebral vascular accident) 1996    LEFT SIDE   . IDDM (insulin dependent diabetes mellitus)   . CKD (chronic kidney disease)   . Congestive heart failure   . Hypercholesteremia   . Hypertension   . Dementia   . DJD (degenerative joint disease) of lumbar spine    Assessment: 81 y/o F tx from [facility] with chest pain, to start heparin, Hgb 9.2, noted renal dysfunction, no cath plans for now.   Patient's level is low at 0.12 at a rate of 950 units/hr  Goal of Therapy:  Heparin level 0.3-0.7 units/ml Monitor platelets by anticoagulation protocol: Yes   Plan:  -Heparin 2000 unit bolus -Increase infusion to 1200 units/hr -HL in 6 hrs -Daily CBC -Monitor for s/sx of  bleeding  Levester Fresh, PharmD, BCPS Clinical Pharmacist Pager [phone] 05/10/2015 9:43 PM

## 2015-05-10 NOTE — Progress Notes (Deleted)
For discharge home, awaiting advance home care to transition to home milrinone.

## 2015-05-10 NOTE — H&P (Signed)
CARDIOLOGY ADMISSION NOTE  Patient ID: [name] MRN: [id number] DOB/AGE: 02-17-1932 81 y.o.  Admit date: 05/09/2015 Primary Physician   Wardell Honour, MD Primary Cardiologist   Dr. Percival Spanish Chief Complaint    Chest pain  HPI: The patient has a history of CAD as below.  Her last stress test in our system was in 2013.  This demonstrated evidence for inferior and distal anteroseptal ischemia.  However, she was managed conservatively at that time secondary to renal insufficiency.  She was admitted at [facility] most recently this year in Feb.  The patient did have a slight troponin rise.  However, she did not want to have a cardiac cath because of renal insufficiency.      She lives by herself and reports that she takes all of her meds. However, she cannot recall them.   She still drives.  I spoke with her daughter.  They do not know if she is compliant with her meds but they don't think that she is taking them. Her primary MD noted recently that she ran out of Lasix. She refuses any help from family.  She has been told in the past that she needs dialysis but she refuses.  She reports chest pain starting Friday.  She reports that this has been similar to previous angina.  She says that it has been severe.  She points to her left breast.  She does not report PND or orthopnea.  She does not have nausea or vomitting.  She denies any radiation of the pain.  Her daughter drove her to [facility] ED.  she was pain free after Morphine, NTG IV and ASA.  Her BP was markedly elevated.  BNP was elevated at 842.  Troponin was negative.   However, her EKG showed significant inferior and lateral ST depression.     Past Medical History  Diagnosis Date  . CAD (coronary artery disease) 2003    Last catheterization 2008. Two-vessel PCI of the first OM branch and mid LAD.  Marland Kitchen CVA (cerebral vascular accident) 1996    LEFT SIDE   . IDDM (insulin dependent diabetes mellitus)   . CKD (chronic kidney disease)   .  Congestive heart failure   . Hypercholesteremia   . Hypertension   . Dementia   . DJD (degenerative joint disease) of lumbar spine     Past Surgical History  Procedure Laterality Date  . Appendectomy    . Knee surgery      Allergies  Allergen Reactions  . Propoxyphene N-Acetaminophen   . Simvastatin Other (See Comments)    headache   No current facility-administered medications on file prior to encounter.   Current Outpatient Prescriptions on File Prior to Encounter  Medication Sig Dispense Refill  . furosemide (LASIX) 40 MG tablet Take 1 tablet (40 mg total) by mouth daily. 30 tablet 3  . insulin NPH Human (HUMULIN N) 100 UNIT/ML injection Inject 0.25 mLs (25 Units total) into the skin 2 (two) times daily. 10 mL 11  . insulin NPH-regular Human (NOVOLIN 70/30) (70-30) 100 UNIT/ML injection INJECT 25 UNITS TWICE A DAY EVERY MORNING AND EVENING 20 mL 1  . nitroGLYCERIN (NITROSTAT) 0.4 MG SL tablet Place 1 tablet (0.4 mg total) under the tongue every 5 (five) minutes x 3 doses as needed for chest pain. 30 tablet 11  . ACCU-CHEK AVIVA PLUS test strip USE TO CHECK BLOOD SUGAR THREE TIMES A DAY AS INSTRUCTED 100 each 0  . amLODipine (NORVASC) 10 MG  tablet TAKE ONE TABLET BY MOUTH ONE TIME DAILY 90 tablet 1  . aspirin EC 81 MG tablet Take 81 mg by mouth daily.    . cloNIDine (CATAPRES) 0.2 MG tablet Take 1 tablet (0.2 mg total) by mouth 2 (two) times daily. 60 tablet 2  . fenofibrate 54 MG tablet TAKE 1 TABLET DAILY 30 tablet 5  . furosemide (LASIX) 20 MG tablet TAKE ONE TABLET BY MOUTH ONE TIME DAILY 30 tablet 4  . isosorbide mononitrate (IMDUR) 120 MG 24 hr tablet TAKE ONE TABLET BY MOUTH ONE TIME DAILY 30 tablet 3  . labetalol (NORMODYNE) 200 MG tablet Take 200 mg by mouth 2 (two) times daily.    Marland Kitchen lisinopril (PRINIVIL,ZESTRIL) 40 MG tablet Take 40 mg by mouth daily.    Marland Kitchen omeprazole (PRILOSEC) 20 MG capsule Take 20 mg by mouth daily.     History   Social History  . Marital Status:  Widowed    Spouse Name: N/A  . Number of Children: N/A  . Years of Education: N/A   Occupational History  . Not on file.   Social History Main Topics  . Smoking status: Never Smoker   . Smokeless tobacco: Never Used  . Alcohol Use: No  . Drug Use: No  . Sexual Activity: No   Other Topics Concern  . Not on file   Social History Narrative   Lives alone.      Family History  Problem Relation Age of Onset  . Diabetes Brother     RETINOPATHY   . Drug abuse Brother   . Liver cancer Brother   . Breast cancer Daughter   . Heart attack Mother 30  . Heart attack Father 2  . Diabetes Sister      ROS:  As stated in the HPI and negative for all other systems.  Physical Exam: Blood pressure 218/74, pulse 97, temperature 98.6 F (37 C), temperature source Oral, resp. rate 17, height 5\' 4"  (1.626 m), weight 155 lb 6.8 oz (70.5 kg), SpO2 99 %.  GENERAL:  Well appearing HEENT:  Pupils equal round and reactive, fundi not visualized, oral mucosa unremarkable NECK:  Positive jugular venous distention, waveform within normal limits, carotid upstroke brisk and symmetric, no bruits, no thyromegaly LYMPHATICS:  No cervical, inguinal adenopathy LUNGS:  Clear to auscultation bilaterally BACK:  No CVA tenderness CHEST:  Unremarkable HEART:  PMI not displaced or sustained,S1 and S2 within normal limits, no S3, no S4, no clicks, no rubs, no murmurs ABD:  Flat, positive bowel sounds normal in frequency in pitch, no bruits, no rebound, no guarding, no midline pulsatile mass, no hepatomegaly, no splenomegaly EXT:  2 plus pulses throughout, moderate edema, no cyanosis no clubbing SKIN:  No rashes no nodules NEURO:  Cranial nerves II through XII grossly intact, motor grossly intact throughout PSYCH:  Cognitively intact, oriented to person place and time   Labs: Lab Results  Component Value Date   BUN 48* 05/09/2015   Lab Results  Component Value Date   CREATININE 2.60* 05/09/2015   Lab  Results  Component Value Date   NA 139 05/09/2015   K 4.6 05/09/2015   CL 111 05/09/2015   CO2 20* 05/09/2015   Lab Results  Component Value Date   TROPONINI 0.03 05/09/2015   Lab Results  Component Value Date   WBC 8.2 05/09/2015   HGB 9.2* 05/09/2015   HCT 27.0* 05/09/2015   MCV 92.3 05/09/2015   PLT 188 05/09/2015  Radiology:  CXR:  There is mild unchanged cardiomegaly and aortic tortuosity. There are mild vascular and interstitial congestive changes consistent with mild CHF. There is no confluent airspace consolidation. There is no large effusion.  EKG:  NSR, rate 106, axis WNL, incomplete LBBB, Inferolateral ST depresion worse than previous consistent with subendocardial ischemia. 05/10/2015  ASSESSMENT AND PLAN:    CHEST PAIN:  Unstable angina.  Unfortunately, cath would be high risk for renal failure.  While today she says that she might consent to cath she would absolutely refuse dialysis.  Therefore, cath is not an option.  I suspect that much of her current problem is related to medication noncompliance.  For tonight we will titrate NTG IV, use morphine as needed and control her BP as below.  I will resume her pervious meds.   I will cycle enzymes and check an echocardiogram.   CKD:  Her creat is at her recent baseline.  I am going to give one dose of IV Lasix tonight.  We will follow this closely.  She would refuse dialysis.    ANEMIA:  Hgb is at baseline.    DM:   I will continue her   HTN:  Resume previous meds.  IV labetolol and hydralazine will be used as well prn.  Titrate IV NTG.  I will hold ACE for now.     SignedMinus Breeding 05/10/2015, 2:03 AM

## 2015-05-10 NOTE — Progress Notes (Signed)
Subjective:  No CP/SOB. Admitted last PM with ACS and abn EKG  Objective:  Temp:  [97.6 F (36.4 C)-99.4 F (37.4 C)] 97.6 F (36.4 C) (06/12 0700) Pulse Rate:  [72-106] 72 (06/12 1045) Resp:  [13-24] 15 (06/12 1045) BP: (146-218)/(42-158) 170/45 mmHg (06/12 1045) SpO2:  [92 %-100 %] 100 % (06/12 1045) Weight:  [155 lb (70.308 kg)-155 lb 6.8 oz (70.5 kg)] 155 lb 6.8 oz (70.5 kg) (06/12 0100) Weight change:   Intake/Output from previous day: 06/11 0701 - 06/12 0700 In: 55.5 [I.V.:55.5] Out: 425 [Urine:425]  Intake/Output from this shift: Total I/O In: 49.5 [I.V.:49.5] Out: 420 [Urine:420]  Physical Exam: General appearance: alert, no distress and slowed mentation Neck: no adenopathy, no carotid bruit, no JVD, supple, symmetrical, trachea midline and thyroid not enlarged, symmetric, no tenderness/mass/nodules Lungs: clear to auscultation bilaterally Heart: regular rate and rhythm, S1, S2 normal, no murmur, click, rub or gallop Extremities: extremities normal, atraumatic, no cyanosis or edema  Lab Results: Results for orders placed or performed during the hospital encounter of 05/09/15 (from the past 48 hour(s))  CBC     Status: Abnormal   Collection Time: 05/09/15  9:12 PM  Result Value Ref Range   WBC 8.2 4.0 - 10.5 K/uL   RBC 3.24 (L) 3.87 - 5.11 MIL/uL   Hemoglobin 9.9 (L) 12.0 - 15.0 g/dL   HCT 29.9 (L) 36.0 - 46.0 %   MCV 92.3 78.0 - 100.0 fL   MCH 30.6 26.0 - 34.0 pg   MCHC 33.1 30.0 - 36.0 g/dL   RDW 13.1 11.5 - 15.5 %   Platelets 188 150 - 400 K/uL  Basic metabolic panel     Status: Abnormal   Collection Time: 05/09/15  9:12 PM  Result Value Ref Range   Sodium 138 135 - 145 mmol/L   Potassium 4.5 3.5 - 5.1 mmol/L   Chloride 107 101 - 111 mmol/L   CO2 20 (L) 22 - 32 mmol/L   Glucose, Bld 215 (H) 65 - 99 mg/dL   BUN 50 (H) 6 - 20 mg/dL   Creatinine, Ser 2.38 (H) 0.44 - 1.00 mg/dL   Calcium 10.1 8.9 - 10.3 mg/dL   GFR calc non Af Amer 18 (L) >60  mL/min   GFR calc Af Amer 21 (L) >60 mL/min    Comment: (NOTE) The eGFR has been calculated using the CKD EPI equation. This calculation has not been validated in all clinical situations. eGFR's persistently <60 mL/min signify possible Chronic Kidney Disease.    Anion gap 11 5 - 15  BNP (order ONLY if patient complains of dyspnea/SOB AND you have documented it for THIS visit)     Status: Abnormal   Collection Time: 05/09/15  9:12 PM  Result Value Ref Range   B Natriuretic Peptide 842.0 (H) 0.0 - 100.0 pg/mL  Troponin I     Status: None   Collection Time: 05/09/15  9:12 PM  Result Value Ref Range   Troponin I 0.03 <0.031 ng/mL    Comment:        NO INDICATION OF MYOCARDIAL INJURY.   Protime-INR     Status: None   Collection Time: 05/09/15  9:12 PM  Result Value Ref Range   Prothrombin Time 14.0 11.6 - 15.2 seconds   INR 1.06 0.00 - 1.49  I-stat troponin, ED     Status: None   Collection Time: 05/09/15  9:31 PM  Result Value Ref Range   Troponin i,  poc 0.03 0.00 - 0.08 ng/mL   Comment 3            Comment: Due to the release kinetics of cTnI, a negative result within the first hours of the onset of symptoms does not rule out myocardial infarction with certainty. If myocardial infarction is still suspected, repeat the test at appropriate intervals.   I-stat chem 8, ed     Status: Abnormal   Collection Time: 05/09/15  9:44 PM  Result Value Ref Range   Sodium 139 135 - 145 mmol/L   Potassium 4.6 3.5 - 5.1 mmol/L   Chloride 111 101 - 111 mmol/L   BUN 48 (H) 6 - 20 mg/dL   Creatinine, Ser 2.60 (H) 0.44 - 1.00 mg/dL   Glucose, Bld 214 (H) 65 - 99 mg/dL   Calcium, Ion 1.31 (H) 1.13 - 1.30 mmol/L   TCO2 17 0 - 100 mmol/L   Hemoglobin 9.2 (L) 12.0 - 15.0 g/dL   HCT 27.0 (L) 36.0 - 46.0 %  MRSA PCR Screening     Status: None   Collection Time: 05/10/15  1:01 AM  Result Value Ref Range   MRSA by PCR NEGATIVE NEGATIVE    Comment:        The GeneXpert MRSA Assay  (FDA approved for NASAL specimens only), is one component of a comprehensive MRSA colonization surveillance program. It is not intended to diagnose MRSA infection nor to guide or monitor treatment for MRSA infections.   TSH     Status: None   Collection Time: 05/10/15  3:35 AM  Result Value Ref Range   TSH 2.795 0.350 - 4.500 uIU/mL  Basic metabolic panel     Status: Abnormal   Collection Time: 05/10/15  3:35 AM  Result Value Ref Range   Sodium 137 135 - 145 mmol/L   Potassium 4.7 3.5 - 5.1 mmol/L   Chloride 107 101 - 111 mmol/L   CO2 22 22 - 32 mmol/L   Glucose, Bld 234 (H) 65 - 99 mg/dL   BUN 44 (H) 6 - 20 mg/dL   Creatinine, Ser 2.42 (H) 0.44 - 1.00 mg/dL   Calcium 9.7 8.9 - 10.3 mg/dL   GFR calc non Af Amer 18 (L) >60 mL/min   GFR calc Af Amer 21 (L) >60 mL/min    Comment: (NOTE) The eGFR has been calculated using the CKD EPI equation. This calculation has not been validated in all clinical situations. eGFR's persistently <60 mL/min signify possible Chronic Kidney Disease.    Anion gap 8 5 - 15  Troponin I     Status: Abnormal   Collection Time: 05/10/15  3:35 AM  Result Value Ref Range   Troponin I 0.08 (H) <0.031 ng/mL    Comment:        PERSISTENTLY INCREASED TROPONIN VALUES IN THE RANGE OF 0.04-0.49 ng/mL CAN BE SEEN IN:       -UNSTABLE ANGINA       -CONGESTIVE HEART FAILURE       -MYOCARDITIS       -CHEST TRAUMA       -ARRYHTHMIAS       -LATE PRESENTING MYOCARDIAL INFARCTION       -COPD   CLINICAL FOLLOW-UP RECOMMENDED.   Glucose, capillary     Status: Abnormal   Collection Time: 05/10/15  7:48 AM  Result Value Ref Range   Glucose-Capillary 196 (H) 65 - 99 mg/dL   Comment 1 Capillary Specimen     Imaging: Imaging  results have been reviewed  Assessment/Plan:   1. Active Problems: 2.   Unstable angina 3.   Angina at rest 4.   Time Spent Directly with Patient:  20 minutes  Length of Stay:  LOS: 1 day   Admitted last PM by Dr. Percival Spanish  with CP. She had impressive ST depression which have resolved. Good diuresis on IV lasix (on PO lasix at home). SBP has been elevated. On IV hep/NTG. She is on Max med Rx for CAD at home. SCr increased making cath high risk for RCN ( patient doesn't want Cath or HD). Exam benign. Cont current Rx. Keep in step down. If recurrent Sx off of IV meds will need to reconsider options.  Quay Burow 05/10/2015, 10:48 AM

## 2015-05-10 NOTE — Progress Notes (Signed)
Cbg-36mg /dl, very sleepy, apple juice 336 ml given tolerated well. cbg after 15 min- 65 mg/dl. Very sleepy, dextrose 50 % 25 ml given. cbg rechecked after 15 min- 147mg /dl. Becomes alert, dinner served, . Cardiac fellow made aware with order. novolog 70/30  9 units given as ordered, apple juice 118 ml given tolerated well. Continue to monitor.

## 2015-05-10 NOTE — Progress Notes (Addendum)
ANTICOAGULATION CONSULT NOTE - Initial Consult  Pharmacy Consult for Heparin  Indication: chest pain/ACS  Allergies  Allergen Reactions  . Propoxyphene N-Acetaminophen   . Simvastatin Other (See Comments)    headache    Patient Measurements: Height: 5\' 4"  (162.6 cm) Weight: 155 lb 6.8 oz (70.5 kg) IBW/kg (Calculated) : 54.7  Vital Signs: Temp: 98.6 F (37 C) (06/12 0100) Temp Source: Oral (06/12 0100) BP: 218/74 mmHg (06/12 0100) Pulse Rate: 97 (06/12 0100)  Labs:  Recent Labs  05/08/15 1203 05/09/15 2112 05/09/15 2144  HGB  --  9.9* 9.2*  HCT  --  29.9* 27.0*  PLT  --  188  --   LABPROT  --  14.0  --   INR  --  1.06  --   CREATININE 2.50* 2.38* 2.60*  TROPONINI  --  0.03  --     Estimated Creatinine Clearance: 16.6 mL/min (by C-G formula based on Cr of 2.6).   Medical History: Past Medical History  Diagnosis Date  . CAD (coronary artery disease) 2003    Last catheterization 2008. Two-vessel PCI of the first OM branch and mid LAD.  Marland Kitchen CVA (cerebral vascular accident) 1996    LEFT SIDE   . IDDM (insulin dependent diabetes mellitus)   . CKD (chronic kidney disease)   . Congestive heart failure   . Hypercholesteremia   . Hypertension   . Dementia   . DJD (degenerative joint disease) of lumbar spine    Assessment: 81 y/o F tx from [facility] with chest pain, to start heparin, Hgb 9.2, noted renal dysfunction, not cath plans for now.   Goal of Therapy:  Heparin level 0.3-0.7 units/ml Monitor platelets by anticoagulation protocol: Yes   Plan:  -Heparin 3000 units BOLUS -Start heparin drip at 750 units/hr -1100 HL -Daily CBC/HL -Monitor for bleeding  Narda Bonds 05/10/2015,2:41 AM  ___________________________ Jones Skene  Heparin level returned undetectable. No issues with the line per RN. No bleeding noted,  Plan: Heparin 2000 units bolus Increase heparin gtt to 950 units/hr 8hr HL at 2130 Daily HL/CBC Monitor s/sx of bleeding  Thank you for  allowing pharmacy to be part of this patient's care team  [place], Pharm.D Clinical Pharmacy Resident Pager: [phone] 05/10/2015 .1:13 PM

## 2015-05-10 NOTE — Progress Notes (Signed)
Result of UA relayed to PA and wants urine  for culture and sensitivity.

## 2015-05-11 ENCOUNTER — Inpatient Hospital Stay (HOSPITAL_COMMUNITY): Payer: Medicare Other

## 2015-05-11 DIAGNOSIS — Z9861 Coronary angioplasty status: Secondary | ICD-10-CM

## 2015-05-11 DIAGNOSIS — N184 Chronic kidney disease, stage 4 (severe): Secondary | ICD-10-CM

## 2015-05-11 DIAGNOSIS — R8290 Unspecified abnormal findings in urine: Secondary | ICD-10-CM

## 2015-05-11 DIAGNOSIS — I2510 Atherosclerotic heart disease of native coronary artery without angina pectoris: Secondary | ICD-10-CM

## 2015-05-11 DIAGNOSIS — D631 Anemia in chronic kidney disease: Secondary | ICD-10-CM | POA: Diagnosis present

## 2015-05-11 DIAGNOSIS — N189 Chronic kidney disease, unspecified: Secondary | ICD-10-CM

## 2015-05-11 DIAGNOSIS — I5032 Chronic diastolic (congestive) heart failure: Secondary | ICD-10-CM

## 2015-05-11 LAB — GLUCOSE, CAPILLARY
GLUCOSE-CAPILLARY: 66 mg/dL (ref 65–99)
GLUCOSE-CAPILLARY: 67 mg/dL (ref 65–99)
GLUCOSE-CAPILLARY: 132 mg/dL — AB (ref 65–99)
Glucose-Capillary: 171 mg/dL — ABNORMAL HIGH (ref 65–99)
Glucose-Capillary: 165 mg/dL — ABNORMAL HIGH (ref 65–99)
Glucose-Capillary: 52 mg/dL — ABNORMAL LOW (ref 65–99)
Glucose-Capillary: 199 mg/dL — ABNORMAL HIGH (ref 65–99)

## 2015-05-11 LAB — CBC
HCT: 21.1 % — ABNORMAL LOW (ref 36.0–46.0)
HCT: 22.5 % — ABNORMAL LOW (ref 36.0–46.0)
HCT: 27.4 % — ABNORMAL LOW (ref 36.0–46.0)
HEMOGLOBIN: 7 g/dL — AB (ref 12.0–15.0)
Hemoglobin: 7.4 g/dL — ABNORMAL LOW (ref 12.0–15.0)
Hemoglobin: 9.1 g/dL — ABNORMAL LOW (ref 12.0–15.0)
MCH: 30.4 pg (ref 26.0–34.0)
MCH: 30 pg (ref 26.0–34.0)
MCH: 29.6 pg (ref 26.0–34.0)
MCHC: 33.2 g/dL (ref 30.0–36.0)
MCHC: 32.9 g/dL (ref 30.0–36.0)
MCHC: 33.2 g/dL (ref 30.0–36.0)
MCV: 91.7 fL (ref 78.0–100.0)
MCV: 91.1 fL (ref 78.0–100.0)
MCV: 89.3 fL (ref 78.0–100.0)
PLATELETS: 140 10*3/uL — AB (ref 150–400)
Platelets: 141 10*3/uL — ABNORMAL LOW (ref 150–400)
Platelets: 138 10*3/uL — ABNORMAL LOW (ref 150–400)
RBC: 2.3 MIL/uL — ABNORMAL LOW (ref 3.87–5.11)
RBC: 2.47 MIL/uL — ABNORMAL LOW (ref 3.87–5.11)
RBC: 3.07 MIL/uL — ABNORMAL LOW (ref 3.87–5.11)
RDW: 13.2 % (ref 11.5–15.5)
RDW: 13.1 % (ref 11.5–15.5)
RDW: 13.1 % (ref 11.5–15.5)
WBC: 3.9 10*3/uL — ABNORMAL LOW (ref 4.0–10.5)
WBC: 4.3 10*3/uL (ref 4.0–10.5)
WBC: 5.4 10*3/uL (ref 4.0–10.5)

## 2015-05-11 LAB — HEMOGLOBIN A1C
HEMOGLOBIN A1C: 7.6 % — AB (ref 4.8–5.6)
Mean Plasma Glucose: 171 mg/dL

## 2015-05-11 LAB — PREPARE RBC (CROSSMATCH)

## 2015-05-11 LAB — HEPARIN LEVEL (UNFRACTIONATED): HEPARIN UNFRACTIONATED: 0.32 [IU]/mL (ref 0.30–0.70)

## 2015-05-11 MED ORDER — SODIUM CHLORIDE 0.9 % IV SOLN
Freq: Once | INTRAVENOUS | Status: AC
  Administered 2015-05-11: 17:00:00 via INTRAVENOUS

## 2015-05-11 MED ORDER — ALBUTEROL SULFATE (2.5 MG/3ML) 0.083% IN NEBU
2.5000 mg | INHALATION_SOLUTION | Freq: Four times a day (QID) | RESPIRATORY_TRACT | Status: DC
  Administered 2015-05-11 (×2): 2.5 mg via RESPIRATORY_TRACT
  Filled 2015-05-11 (×2): qty 3

## 2015-05-11 MED ORDER — GUAIFENESIN ER 600 MG PO TB12
600.0000 mg | ORAL_TABLET | Freq: Two times a day (BID) | ORAL | Status: DC
  Administered 2015-05-11 – 2015-05-12 (×3): 600 mg via ORAL
  Filled 2015-05-11 (×4): qty 1

## 2015-05-11 MED ORDER — FUROSEMIDE 80 MG PO TABS
80.0000 mg | ORAL_TABLET | Freq: Every day | ORAL | Status: DC
  Administered 2015-05-12 – 2015-05-14 (×3): 80 mg via ORAL
  Filled 2015-05-11 (×2): qty 1
  Filled 2015-05-11: qty 2
  Filled 2015-05-11: qty 1

## 2015-05-11 MED ORDER — LEVALBUTEROL HCL 0.63 MG/3ML IN NEBU
0.6300 mg | INHALATION_SOLUTION | Freq: Four times a day (QID) | RESPIRATORY_TRACT | Status: DC
  Administered 2015-05-11: 0.63 mg via RESPIRATORY_TRACT

## 2015-05-11 MED ORDER — DIPHENHYDRAMINE HCL 25 MG PO CAPS: 25.0000 mg | ORAL_CAPSULE | Freq: Once | ORAL | Status: DC

## 2015-05-11 MED ORDER — FUROSEMIDE 10 MG/ML IJ SOLN
40.0000 mg | Freq: Once | INTRAMUSCULAR | Status: AC
  Administered 2015-05-11: 40 mg via INTRAVENOUS
  Filled 2015-05-11: qty 4

## 2015-05-11 MED ORDER — FUROSEMIDE 10 MG/ML IJ SOLN: 20.0000 mg | Freq: Once | INTRAMUSCULAR | Status: DC

## 2015-05-11 MED ORDER — ENOXAPARIN SODIUM 30 MG/0.3ML ~~LOC~~ SOLN
30.0000 mg | SUBCUTANEOUS | Status: DC
  Administered 2015-05-12 – 2015-05-13 (×2): 30 mg via SUBCUTANEOUS
  Filled 2015-05-11 (×4): qty 0.3

## 2015-05-11 MED ORDER — ISOSORBIDE MONONITRATE ER 60 MG PO TB24
120.0000 mg | ORAL_TABLET | Freq: Every day | ORAL | Status: DC
  Administered 2015-05-11 – 2015-05-14 (×4): 120 mg via ORAL
  Filled 2015-05-11 (×4): qty 2

## 2015-05-11 MED ORDER — METHYLPREDNISOLONE SODIUM SUCC 125 MG IJ SOLR
60.0000 mg | Freq: Once | INTRAMUSCULAR | Status: AC
  Administered 2015-05-11: 60 mg via INTRAVENOUS
  Filled 2015-05-11: qty 2

## 2015-05-11 MED ORDER — GUAIFENESIN-DM 100-10 MG/5ML PO SYRP
5.0000 mL | ORAL_SOLUTION | ORAL | Status: DC | PRN
  Administered 2015-05-11 – 2015-05-12 (×3): 5 mL via ORAL
  Filled 2015-05-11 (×3): qty 5

## 2015-05-11 MED ORDER — DEXTROSE 50 % IV SOLN
INTRAVENOUS | Status: AC
  Administered 2015-05-11: 50 mL
  Filled 2015-05-11: qty 50

## 2015-05-11 MED ORDER — POLYETHYLENE GLYCOL 3350 17 G PO PACK
17.0000 g | PACK | Freq: Two times a day (BID) | ORAL | Status: DC
  Administered 2015-05-11 – 2015-05-14 (×5): 17 g via ORAL
  Filled 2015-05-11 (×9): qty 1

## 2015-05-11 MED ORDER — PANTOPRAZOLE SODIUM 40 MG PO TBEC
40.0000 mg | DELAYED_RELEASE_TABLET | Freq: Every day | ORAL | Status: DC
  Administered 2015-05-11 – 2015-05-14 (×4): 40 mg via ORAL
  Filled 2015-05-11 (×4): qty 1

## 2015-05-11 MED ORDER — BOOST PLUS PO LIQD
237.0000 mL | Freq: Three times a day (TID) | ORAL | Status: DC
  Administered 2015-05-12 – 2015-05-14 (×6): 237 mL via ORAL
  Filled 2015-05-11 (×14): qty 237

## 2015-05-11 MED ORDER — ALBUTEROL SULFATE (2.5 MG/3ML) 0.083% IN NEBU
2.5000 mg | INHALATION_SOLUTION | Freq: Three times a day (TID) | RESPIRATORY_TRACT | Status: DC
  Administered 2015-05-12 – 2015-05-13 (×2): 2.5 mg via RESPIRATORY_TRACT
  Filled 2015-05-11 (×3): qty 3

## 2015-05-11 NOTE — Progress Notes (Addendum)
Patient completed 1st unit of blood. VS stable with no increased SOB. PIV L AC flushes but has slight edema and bruising. L hand flushed but patient c/o pain. Refusing 2nd unit of blood and new PIV stick, complaining of how many times she has been stuck and that she doesn't want to be stuck again. After discussion with patient she agreed to allow IV team to stick her once and attempt to draw post transfusion lab at same time. Will advise IV team at appropriate time. Spoke with Cardiology Fellow Dr Marlowe Sax and advised pt refusal blood and sticks. Okay to get BMET with post transfusion CBC at PIV start.  IV team only able to get enough blood for CBC and pt refused further stick. Will continue to encourage and educate.

## 2015-05-11 NOTE — Progress Notes (Signed)
[facility] for Heparin  Indication: chest pain/ACS  Allergies  Allergen Reactions  . Propoxyphene N-Acetaminophen   . Simvastatin Other (See Comments)    headache    Patient Measurements: Height: 5\' 4"  (162.6 cm) Weight: 155 lb 6.8 oz (70.5 kg) IBW/kg (Calculated) : 54.7  Vital Signs: Temp: 98.1 F (36.7 C) (06/13 0800) Temp Source: Oral (06/13 0800) BP: 160/47 mmHg (06/13 0800) Pulse Rate: 72 (06/13 0800)  Labs:  Recent Labs  05/09/15 2112 05/09/15 2144 05/10/15 0335 05/10/15 0958 05/10/15 1058 05/10/15 1450 05/10/15 2110 05/11/15 0250 05/11/15 0740  HGB 9.9* 9.2*  --   --   --   --   --  7.0* 7.4*  HCT 29.9* 27.0*  --   --   --   --   --  21.1* 22.5*  PLT 188  --   --   --   --   --   --  140* 141*  LABPROT 14.0  --   --   --   --   --   --   --   --   INR 1.06  --   --   --   --   --   --   --   --   HEPARINUNFRC  --   --   --   --  <0.10*  --  0.12* 0.32  --   CREATININE 2.38* 2.60* 2.42*  --   --   --   --   --   --   TROPONINI 0.03  --  0.08* 0.08*  --  0.07*  --   --   --     Estimated Creatinine Clearance: 17.9 mL/min (by C-G formula based on Cr of 2.42).   Assessment: 81 y/o F tx from [facility] with chest pain, to start heparin, Hgb 9.2, noted renal dysfunction, no current cath plans  Heparin level at low end of goal 0.32, hgb dropped overnight 9.2>7.4, pltc down 188>141. No bleeding noted, will continue to follow closely.  Goal of Therapy:  Heparin level 0.3-0.7 units/ml Monitor platelets by anticoagulation protocol: Yes   Plan:  - Continue heparin at 1200 units/hr - Daily CBC/HL - Monitor for s/sx of bleeding  Erin Hearing PharmD., BCPS Clinical Pharmacist Pager [phone] 05/11/2015 8:47 AM

## 2015-05-11 NOTE — Progress Notes (Signed)
Inpatient Diabetes Program Recommendations  AACE/ADA: New Consensus Statement on Inpatient Glycemic Control (2013)  Target Ranges:  Prepandial:   less than 140 mg/dL      Peak postprandial:   less than 180 mg/dL (1-2 hours)      Critically ill patients:  140 - 180 mg/dL   Results for [name], [name] (MRN [id number]) as of 05/11/2015 09:52  Ref. Range 05/10/2015 07:48 05/10/2015 11:29 05/10/2015 16:19 05/10/2015 16:58 05/10/2015 17:18 05/10/2015 21:42 05/11/2015 08:12  Glucose-Capillary Latest Ref Range: 65-99 mg/dL 196 (H) 141 (H) 36 (LL) 65 147 (H) 182 (H) 171 (H)   Diabetes history: DM2 Outpatient Diabetes medications: Prescribed 70/30 25 units BID but note on home med list states patient takes 20 units BID Current orders for Inpatient glycemic control: 70/30 25 units QAM, 70/30 18 units QPM, Novolog 0-15 units TID with meals  Inpatient Diabetes Program Recommendations Insulin - Basal: Noted glucose down to 36 mg/dl yesterday at 16:19. Please consider decreasing 70/30 morning dose to 20 units QAM with breakfast and leave evening dose as ordered.  Thanks, Barnie Alderman, RN, MSN, CCRN, CDE Diabetes Coordinator Inpatient Diabetes Program [phone] (Team Pager from [place] to [place]) [phone] ([facility] office) [phone] [facility] office) [phone] [facility] office)

## 2015-05-11 NOTE — Progress Notes (Signed)
Dr. Percell Miller, SZE notified this morning of [place], [place] Hgb of 7.0. Ordered to repeat the CBC and please get blood transfusion consent.  Day RN aware. Will monitor.

## 2015-05-11 NOTE — Evaluation (Signed)
Physical Therapy Evaluation Patient Details Name: [name] MRN: [id number] DOB: 1932-11-14 Today's Date: 05/11/2015   History of Present Illness  81 year old female with PMH significant for CAD, HTN, HLD, diastolic CHF, ischemic CVA, and CKD stage IV presenting with chest pain and palpitations with [place].   Clinical Impression  Pt pleasant but with decreased activity tolerance and lethargy today. Pt lives alone and cares for herself with family in the area who can assist but pt resistant to their help as well as the possibility of any post acute rehab. Pt with below deficits (PT problem list) who will benefit from acute therapy to maximize mobility, function and activity tolerance to return to independent function.     Follow Up Recommendations Home health PT;Supervision for mobility/OOB    Equipment Recommendations  None recommended by PT    Recommendations for Other Services OT consult     Precautions / Restrictions Precautions Precautions: Fall      Mobility  Bed Mobility Overal bed mobility: Needs Assistance Bed Mobility: Rolling;Sidelying to Sit Rolling: Supervision Sidelying to sit: Supervision       General bed mobility comments: cues for sequence and initiation  Transfers Overall transfer level: Needs assistance   Transfers: Sit to/from Stand Sit to Stand: Min guard         General transfer comment: cues for hand placement x 2 trials  Ambulation/Gait Ambulation/Gait assistance: Min guard Ambulation Distance (Feet): 15 Feet Assistive device: Rolling walker (2 wheeled) Gait Pattern/deviations: Step-through pattern;Decreased stride length   Gait velocity interpretation: Below normal speed for age/gender General Gait Details: cues for posture, limited distance due to fatigue. Of note pt with decreased Hgb  Stairs            Wheelchair Mobility    Modified Rankin (Stroke Patients Only)       Balance Overall balance assessment: Needs  assistance   Sitting balance-Leahy Scale: Good       Standing balance-Leahy Scale: Fair                               Pertinent Vitals/Pain Pain Assessment: No/denies pain  HR 63-72 with activity sats 97% on RA    Home Living Family/patient expects to be discharged to:: Private residence Living Arrangements: Alone Available Help at Discharge: Family;Available PRN/intermittently Type of Home: House Home Access: Stairs to enter Entrance Stairs-Rails: Right Entrance Stairs-Number of Steps: 10 Home Layout: One level Home Equipment: Walker - 2 wheels;Cane - single point      Prior Function Level of Independence: Independent         Comments: pt is still active cooking, cleaning and driving     Hand Dominance        Extremity/Trunk Assessment   Upper Extremity Assessment: Generalized weakness           Lower Extremity Assessment: Generalized weakness      Cervical / Trunk Assessment: Normal  Communication   Communication: No difficulties  Cognition Arousal/Alertness: Awake/alert Behavior During Therapy: Flat affect Overall Cognitive Status: Impaired/Different from baseline Area of Impairment: Attention   Current Attention Level: Sustained           General Comments: pt drowsy on arrival and increased attention and alertness once EOB but remained with flat affect and increased time to follow commands    General Comments      Exercises        Assessment/Plan  PT Assessment Patient needs continued PT services  PT Diagnosis Generalized weakness;Difficulty walking   PT Problem List Decreased activity tolerance;Decreased balance;Decreased knowledge of use of DME  PT Treatment Interventions Gait training;Stair training;DME instruction;Functional mobility training;Therapeutic activities;Therapeutic exercise;Balance training;Patient/family education   PT Goals (Current goals can be found in the Care Plan section) Acute Rehab PT  Goals Patient Stated Goal: return home PT Goal Formulation: With patient/family Time For Goal Achievement: 05/25/15 Potential to Achieve Goals: Fair    Frequency Min 3X/week   Barriers to discharge Decreased caregiver support      Co-evaluation               End of Session Equipment Utilized During Treatment: Gait belt Activity Tolerance: Patient tolerated treatment well Patient left: in chair;with call bell/phone within reach Nurse Communication: Mobility status         Time: 1359-1418 PT Time Calculation (min) (ACUTE ONLY): 19 min   Charges:   PT Evaluation $Initial PT Evaluation Tier I: 1 Procedure     PT G CodesMelford Aase 05/11/2015, 2:26 PM [name], [place]

## 2015-05-11 NOTE — Progress Notes (Signed)
Utilization Review Completed.  

## 2015-05-11 NOTE — Progress Notes (Signed)
Echocardiogram 2D Echocardiogram has been performed.  [name] 05/11/2015, 11:59 AM

## 2015-05-11 NOTE — Progress Notes (Signed)
Pt blood sugar 67 @ 1201- 4 oz orange juice given to pt. Sugar rechecked at 1216 resulting in 67. A second Orange juice given to pt. Rechecked CBG @ 1252 with a result of 52. Dr. Tyrell Antonio made aware and D50 administered. CBG @ 1314 resulted in 165. Pt is drowsy. Will continue to monitor closely.

## 2015-05-11 NOTE — Consult Note (Signed)
[facility] Medical Consultation  [name]:[id number] DOB: 1932/04/14 DOA: 05/09/2015 PCP: Wardell Honour, MD   Requesting physician: DR Gwenlyn Found Date of consultation: 6-13 Reason for consultation: Bronchitis  Impression/Recommendations Principal Problem:   Unstable angina Active Problems:   DIASTOLIC HEART FAILURE, CHRONIC   DM (diabetes mellitus)   CKD (chronic kidney disease) stage 4, GFR 15-29 ml/min   HTN (hypertension)   Coronary atherosclerosis of native coronary artery: stress test 2013 showing inferior and distal anteroseptal ischemia   Anemia in chronic kidney disease   Abnormal urinalysis    1. Acute bronchitis;  I will start Nebulizer treatments, Will give one time dose of solumedrol.  Continue with guaifenesin for cough.  reassess improvement.   2-Dyspnea; related to HF and bronchitis.   3-Acute diastolic HF exacerbation;  Continue with lasix. Management per cardiology.   4-Diabetes; with Hypoglycemia. Hold 70/30. Patient with hypoglycemia and poor oral intake.  D-50  Ordered.  Check CBG every 4 hours.   5-CKD stage IV. Cr baseline 2 to 3.  Repeat labs.  Monitor on lasix.   6-Constipation;  I have order miralax.   7-FTT; related to acute illness.  Order boost. Nutrition supplement.   I will followup again tomorrow. Please contact me if I can be of assistance in the meanwhile. Thank you for this consultation.  Chief Complaint: Cough, weakness.   HPI: 81 year old with Multiple medical problems, HF, CKD, Diabetes, who was admitted by cardiology service on 6-11 for unable angina. Patient chest pain has improved. She is still having problems with Dyspnea and productive cough. The cough started 1 week ago. She is feeling weak, tired, decrease appetite. She relates lower extremity edema has decreases. She is also complaining of constipation. No BM since admission.   Review of Systems:  Negative except as per HPI.  Past Medical History   Diagnosis Date  . CAD (coronary artery disease) 2003    Last catheterization 2008. Two-vessel PCI of the first OM branch and mid LAD.  Marland Kitchen CVA (cerebral vascular accident) 1996    LEFT SIDE   . IDDM (insulin dependent diabetes mellitus)   . CKD (chronic kidney disease)   . Congestive heart failure   . Hypercholesteremia   . Hypertension   . Dementia   . DJD (degenerative joint disease) of lumbar spine    Past Surgical History  Procedure Laterality Date  . Appendectomy    . Knee surgery     Social History:  reports that she has never smoked. She has never used smokeless tobacco. She reports that she does not drink alcohol or use illicit drugs.  Allergies  Allergen Reactions  . Propoxyphene N-Acetaminophen   . Simvastatin Other (See Comments)    headache   Family History  Problem Relation Age of Onset  . Diabetes Brother     RETINOPATHY   . Drug abuse Brother   . Liver cancer Brother   . Breast cancer Daughter   . Heart attack Mother 55  . Heart attack Father 4  . Diabetes Sister     Prior to Admission medications   Medication Sig Start Date End Date Taking? Authorizing Provider  fenofibrate 54 MG tablet TAKE 1 TABLET DAILY 08/29/13  Yes [name], MD  furosemide (LASIX) 40 MG tablet Take 1 tablet (40 mg total) by mouth daily. 05/08/15  Yes Wardell Honour, MD  insulin NPH Human (HUMULIN N) 100 UNIT/ML injection Inject 0.25 mLs (25 Units total) into the skin  2 (two) times daily. Patient taking differently: Inject 20 Units into the skin 2 (two) times daily.  12/26/14  Yes Wardell Honour, MD  nitroGLYCERIN (NITROSTAT) 0.4 MG SL tablet Place 1 tablet (0.4 mg total) under the tongue every 5 (five) minutes x 3 doses as needed for chest pain. 10/08/14  Yes Minus Breeding, MD  ACCU-CHEK AVIVA PLUS test strip USE TO CHECK BLOOD SUGAR THREE TIMES A DAY AS INSTRUCTED 05/04/15   Chipper Herb, MD  amLODipine (NORVASC) 10 MG tablet TAKE ONE TABLET BY MOUTH ONE TIME DAILY 01/02/15    Wardell Honour, MD  aspirin EC 81 MG tablet Take 81 mg by mouth daily.    Historical Provider, MD  cloNIDine (CATAPRES) 0.2 MG tablet Take 1 tablet (0.2 mg total) by mouth 2 (two) times daily. 01/28/15   Sharion Balloon, FNP  furosemide (LASIX) 20 MG tablet TAKE ONE TABLET BY MOUTH ONE TIME DAILY 03/19/15   Wardell Honour, MD  isosorbide mononitrate (IMDUR) 120 MG 24 hr tablet TAKE ONE TABLET BY MOUTH ONE TIME DAILY 04/07/15   Wardell Honour, MD  labetalol (NORMODYNE) 200 MG tablet Take 200 mg by mouth 2 (two) times daily.    Historical Provider, MD  lisinopril (PRINIVIL,ZESTRIL) 40 MG tablet Take 40 mg by mouth daily.    Historical Provider, MD  omeprazole (PRILOSEC) 20 MG capsule Take 20 mg by mouth daily.    Historical Provider, MD  ranitidine (ZANTAC) 150 MG tablet Take 150 mg by mouth 2 (two) times daily.  05/02/15   Historical Provider, MD  sodium polystyrene (KAYEXALATE) 15 GM/60ML suspension Take 15 g by mouth once.  04/07/15   Historical Provider, MD   Physical Exam: Blood pressure 151/42, pulse 62, temperature 98.4 F (36.9 C), temperature source Oral, resp. rate 23, height 5\' 4"  (1.626 m), weight 70.5 kg (155 lb 6.8 oz), SpO2 100 %. Filed Vitals:   05/11/15 1300  BP:   Pulse: 62  Temp:   Resp: 23   General:  Patient lethargic, easy awakens.  Eyes: PERRL, normal lids, irises & conjunctiva ENT: grossly normal hearing, lips & tongue Neck: no LAD, masses or thyromegaly Cardiovascular: RRR, no m/r/g. Trace edema Respiratory: Normal respiratory effort. Bilateral ronchus Abdomen: soft, ntnd Skin: no rash or induration seen on limited exam Musculoskeletal: grossly normal tone BUE/BLE Neurologic: lethargic, easy awakens, follow some commands, mover extremities.    Labs on Admission:  Basic Metabolic Panel:  Recent Labs Lab 05/08/15 1203 05/09/15 2112 05/09/15 2144 05/10/15 0335  NA 141 138 139 137  K 5.1 4.5 4.6 4.7  CL 109* 107 111 107  CO2 17* 20*  --  22  GLUCOSE  226* 215* 214* 234*  BUN 53* 50* 48* 44*  CREATININE 2.50* 2.38* 2.60* 2.42*  CALCIUM 9.6 10.1  --  9.7   Liver Function Tests: No results for input(s): AST, ALT, ALKPHOS, BILITOT, PROT, ALBUMIN in the last 168 hours. No results for input(s): LIPASE, AMYLASE in the last 168 hours. No results for input(s): AMMONIA in the last 168 hours. CBC:  Recent Labs Lab 05/09/15 2112 05/09/15 2144 05/11/15 0250 05/11/15 0740  WBC 8.2  --  3.9* 4.3  HGB 9.9* 9.2* 7.0* 7.4*  HCT 29.9* 27.0* 21.1* 22.5*  MCV 92.3  --  91.7 91.1  PLT 188  --  140* 141*   Cardiac Enzymes:  Recent Labs Lab 05/09/15 2112 05/10/15 0335 05/10/15 0958 05/10/15 1450  TROPONINI 0.03 0.08* 0.08* 0.07*  BNP: Invalid input(s): POCBNP CBG:  Recent Labs Lab 05/10/15 2142 05/11/15 0812 05/11/15 1201 05/11/15 1216 05/11/15 1252  GLUCAP 182* 171* 67 66 52*    Radiological Exams on Admission: Dg Chest Port 1 View  05/09/2015   CLINICAL DATA:  Midsternal chest pain, onset yesterday. Dyspnea and weakness.  EXAM: PORTABLE CHEST - 1 VIEW  COMPARISON:  01/04/2015  FINDINGS: There is mild unchanged cardiomegaly and aortic tortuosity. There are mild vascular and interstitial congestive changes consistent with mild CHF. There is no confluent airspace consolidation. There is no large effusion.  IMPRESSION: Mild vascular and interstitial congestive changes.   Electronically Signed   By: Andreas Newport M.D.   On: 05/09/2015 21:59    EKG: Independently reviewed. Sinus rhythm first degree AV block  Time spent: 75 minutes  Garret Teale A [facility] Pager [phone]  If 7PM-7AM, please contact night-coverage [email or web address] Password [email or web address] 05/11/2015, 1:15 PM

## 2015-05-11 NOTE — Progress Notes (Addendum)
Patient Profile: 81 year old female with PMH significant for CAD, HTN, HLD, diastolic CHF, ischemic CVA, and CKD stage IV presenting with chest pain and palpitations.   Subjective: C/o coughing which makes her chest sore. The cough is productive with thick, white-yellow sputum. No chest pain, palpitations, PND, or orthopnea. No BM since prior to admission.   Objective: Vital signs in last 24 hours: Temp:  [97.7 F (36.5 C)-98.9 F (37.2 C)] 98.1 F (36.7 C) (06/13 0800) Pulse Rate:  [59-85] 72 (06/13 0900) Resp:  [15-20] 19 (06/13 0900) BP: (115-170)/(31-50) 160/47 mmHg (06/13 0800) SpO2:  [99 %-100 %] 100 % (06/13 0900)    Intake/Output from previous day: 06/12 0701 - 06/13 0700 In: 244 [P.O.:500; I.V.:379] Out: 920 [Urine:920] Intake/Output this shift: Total I/O In: 251.3 [P.O.:150; I.V.:101.3] Out: 200 [Urine:200]  Medications Scheduled Meds: . amLODipine  10 mg Oral Daily  . aspirin EC  81 mg Oral Daily  . cloNIDine  0.2 mg Oral BID  . famotidine  10 mg Oral Daily  . fenofibrate  54 mg Oral Daily  . furosemide  40 mg Oral Daily  . insulin aspart  0-15 Units Subcutaneous TID WC  . insulin aspart protamine- aspart  25 Units Subcutaneous Q breakfast   And  . insulin aspart protamine- aspart  18 Units Subcutaneous Q supper  . labetalol  200 mg Oral BID   Continuous Infusions: . heparin Stopped (05/11/15 0845)  . nitroGLYCERIN Stopped (05/11/15 0845)   PRN Meds:.hydrALAZINE, nitroGLYCERIN, ondansetron (ZOFRAN) IV  PE: General appearance: alert, cooperative and no distress Neck: JVD - 9 cm, no adenopathy, no carotid bruit, supple, symmetrical, trachea midline and thyroid not enlarged, symmetric, no tenderness/mass/nodules Lungs: rales and rhonchi in all lung fields. Poor air movement. Exp wheeze. Coarse cough Heart: regular rate and rhythm and unable to categorize murmur 2/2 coarse rhonchi Abdomen: soft, non-tender; bowel sounds normal; no masses,  no  organomegaly Extremities: edema 1+ pretibial edema bilaterally Pulses: 2+ and symmetric Skin: Skin color, texture, turgor normal. No rashes or lesions Neurologic: Motor: grade 4 out of 5 in RUE and RLE, mild right-sided facial droop.  Lab Results:   Recent Labs  05/09/15 2112 05/09/15 2144 05/11/15 0250 05/11/15 0740  WBC 8.2  --  3.9* 4.3  HGB 9.9* 9.2* 7.0* 7.4*  HCT 29.9* 27.0* 21.1* 22.5*  PLT 188  --  140* 141*   BMET  Recent Labs  05/08/15 1203 05/09/15 2112 05/09/15 2144 05/10/15 0335  NA 141 138 139 137  K 5.1 4.5 4.6 4.7  CL 109* 107 111 107  CO2 17* 20*  --  22  GLUCOSE 226* 215* 214* 234*  BUN 53* 50* 48* 44*  CREATININE 2.50* 2.38* 2.60* 2.42*  CALCIUM 9.6 10.1  --  9.7   PT/INR  Recent Labs  05/09/15 2112  LABPROT 14.0  INR 1.06   TROPONIN I  Date Value Ref Range Status  05/10/2015 0.07* <0.031 ng/mL Final  05/10/2015 0.08* <0.031 ng/mL Final  05/10/2015 0.08* <0.031 ng/mL Final   Urinalysis    Component Value Date/Time   COLORURINE YELLOW 05/10/2015 1245   APPEARANCEUR CLOUDY* 05/10/2015 1245   LABSPEC 1.012 05/10/2015 1245   PHURINE 5.0 05/10/2015 1245   GLUCOSEU NEGATIVE 05/10/2015 1245   HGBUR TRACE* 05/10/2015 1245   BILIRUBINUR NEGATIVE 05/10/2015 1245   KETONESUR NEGATIVE 05/10/2015 1245   PROTEINUR >300* 05/10/2015 1245   UROBILINOGEN 0.2 05/10/2015 1245   NITRITE NEGATIVE 05/10/2015 1245   LEUKOCYTESUR  MODERATE* 05/10/2015 1245   B NATRIURETIC PEPTIDE  Date Value Ref Range Status  05/09/2015 842.0* 0.0 - 100.0 pg/mL Final   EKG: SR with 1st degree AVB, rate 86, lateral T wave depression  Dg Chest Port 1 View 05/09/2015   CLINICAL DATA:  Midsternal chest pain, onset yesterday. Dyspnea and weakness.  EXAM: PORTABLE CHEST - 1 VIEW  COMPARISON:  01/04/2015  FINDINGS: There is mild unchanged cardiomegaly and aortic tortuosity. There are mild vascular and interstitial congestive changes consistent with mild CHF. There is no  confluent airspace consolidation. There is no large effusion.  IMPRESSION: Mild vascular and interstitial congestive changes.   Electronically Signed   By: Andreas Newport M.D.   On: 05/09/2015 21:59    Assessment/Plan Principal Problem:   Unstable angina Active Problems:   DIASTOLIC HEART FAILURE, CHRONIC   DM (diabetes mellitus)   CKD (chronic kidney disease) stage 4, GFR 15-29 ml/min   HTN (hypertension)   Coronary atherosclerosis of native coronary artery: stress test 2013 showing inferior and distal anteroseptal ischemia   Anemia in chronic kidney disease   Abnormal urinalysis   1. Unstable angina/ Presumed CAD:  - clinically stable with no definitive troponin elevation. No chest pain currently. No cath 2/2 stage 4 kidney disease.  - Wean nitro drip and restart home dose Imdur today.   - Stop heparin gtt and start SQ heparin for prophylaxis  - Continue ASA, labetelol, and fenofibrate (statin intolerant).  - No ACEI/ARB in the setting of stage IV kidney disease  2. Anemia ?2/2 CKD:  - Hgb this morning is 7.4, up from 7.0 earlier today.  - Has been transfused in the past, for now transfuse for Hgb <7.  - Guiac stools, but pt has refused GI eval in past - Daily CBC.   3. CKD stage IV:  - minimal response to IV Lasix 20 mg yesterday, net negative 41 mL.  - Refuses hemodialysis so no dye studies.  - BUN/Cr at baseline 06/12, recheck daily. Pt reluctant to have more blood drawn today  4. HLD:  - continue fenofibrate   5. Possible URI:  - No PNA on admission CXR  - exam with prominent rhonchi - productive cough (white sputum). Consider sputum cx.  - Consult [facility].  - Xopenx nebs/robitussin.   6. Abnormal urinalysis/UTI:  - UA showing many bacteria and moderate leukocytes, culture pending.  - Start abx once culture results back   7. Chronic diastolic CHF - Weight is at/close to baseline - not checked since admit, ck now and daily - has JVD, but has other  resp issues and renal function is poor - no sig response to Lasix 20 mg IV 06/12, got 40 mg po this am - MD advise on giving Lasix 80 mg bid   8. Possible Deconditioning - pt has problems moving self in/out of bed - wants to home from hospital, not willing to consider other options - PT eval and treat.   Tx telemetry    LOS: 2 days   Rosaria Ferries, PA-C 05/11/2015 10:18 AM   Agree with note by Rosaria Ferries PA-C  No further CP. Will DC IV hep/NTG. Tx 2 Units PRBCs. Tx tele. Xopenex nebs and Rx for UTI . Will get TRH to assist. Lasix after each unit. No plans for cath.    Lorretta Harp, M.D., [place], [facility], Laverta [place] [address] [place], [place]  [id number]  [phone] 05/11/2015 12:48 PM

## 2015-05-12 DIAGNOSIS — J208 Acute bronchitis due to other specified organisms: Secondary | ICD-10-CM | POA: Diagnosis present

## 2015-05-12 DIAGNOSIS — E118 Type 2 diabetes mellitus with unspecified complications: Secondary | ICD-10-CM

## 2015-05-12 DIAGNOSIS — N390 Urinary tract infection, site not specified: Secondary | ICD-10-CM | POA: Diagnosis present

## 2015-05-12 DIAGNOSIS — IMO0001 Reserved for inherently not codable concepts without codable children: Secondary | ICD-10-CM | POA: Diagnosis present

## 2015-05-12 DIAGNOSIS — Z794 Long term (current) use of insulin: Secondary | ICD-10-CM

## 2015-05-12 DIAGNOSIS — R627 Adult failure to thrive: Secondary | ICD-10-CM | POA: Diagnosis present

## 2015-05-12 DIAGNOSIS — K5909 Other constipation: Secondary | ICD-10-CM

## 2015-05-12 DIAGNOSIS — E875 Hyperkalemia: Secondary | ICD-10-CM | POA: Diagnosis present

## 2015-05-12 DIAGNOSIS — I5031 Acute diastolic (congestive) heart failure: Secondary | ICD-10-CM | POA: Diagnosis present

## 2015-05-12 DIAGNOSIS — N184 Chronic kidney disease, stage 4 (severe): Secondary | ICD-10-CM | POA: Diagnosis present

## 2015-05-12 DIAGNOSIS — I272 Pulmonary hypertension, unspecified: Secondary | ICD-10-CM | POA: Diagnosis present

## 2015-05-12 LAB — BASIC METABOLIC PANEL
Anion gap: 8 (ref 5–15)
Anion gap: 11 (ref 5–15)
BUN: 64 mg/dL — ABNORMAL HIGH (ref 6–20)
BUN: 76 mg/dL — ABNORMAL HIGH (ref 6–20)
CALCIUM: 9.3 mg/dL (ref 8.9–10.3)
CO2: 21 mmol/L — AB (ref 22–32)
CO2: 19 mmol/L — ABNORMAL LOW (ref 22–32)
CREATININE: 2.89 mg/dL — AB (ref 0.44–1.00)
Calcium: 9.4 mg/dL (ref 8.9–10.3)
Chloride: 105 mmol/L (ref 101–111)
Chloride: 99 mmol/L — ABNORMAL LOW (ref 101–111)
Creatinine, Ser: 3.06 mg/dL — ABNORMAL HIGH (ref 0.44–1.00)
GFR calc Af Amer: 17 mL/min — ABNORMAL LOW (ref >60)
GFR calc non Af Amer: 14 mL/min — ABNORMAL LOW (ref >60)
GFR calc non Af Amer: 13 mL/min — ABNORMAL LOW (ref >60)
GFR, EST AFRICAN AMERICAN: 16 mL/min — AB (ref >60)
Glucose, Bld: 393 mg/dL — ABNORMAL HIGH (ref 65–99)
Glucose, Bld: 414 mg/dL — ABNORMAL HIGH (ref 65–99)
Potassium: 5.6 mmol/L — ABNORMAL HIGH (ref 3.5–5.1)
Potassium: 4.6 mmol/L (ref 3.5–5.1)
Sodium: 134 mmol/L — ABNORMAL LOW (ref 135–145)
Sodium: 129 mmol/L — ABNORMAL LOW (ref 135–145)

## 2015-05-12 LAB — CBC
HCT: 27.2 % — ABNORMAL LOW (ref 36.0–46.0)
HEMOGLOBIN: 9.1 g/dL — AB (ref 12.0–15.0)
MCH: 29.7 pg (ref 26.0–34.0)
MCHC: 33.5 g/dL (ref 30.0–36.0)
MCV: 88.9 fL (ref 78.0–100.0)
PLATELETS: 176 10*3/uL (ref 150–400)
RBC: 3.06 MIL/uL — ABNORMAL LOW (ref 3.87–5.11)
RDW: 13.2 % (ref 11.5–15.5)
WBC: 5.3 10*3/uL (ref 4.0–10.5)

## 2015-05-12 LAB — STREP PNEUMONIAE URINARY ANTIGEN: STREP PNEUMO URINARY ANTIGEN: NEGATIVE

## 2015-05-12 LAB — GLUCOSE, CAPILLARY
GLUCOSE-CAPILLARY: 285 mg/dL — AB (ref 65–99)
GLUCOSE-CAPILLARY: 313 mg/dL — AB (ref 65–99)
GLUCOSE-CAPILLARY: 325 mg/dL — AB (ref 65–99)
GLUCOSE-CAPILLARY: 336 mg/dL — AB (ref 65–99)
Glucose-Capillary: 342 mg/dL — ABNORMAL HIGH (ref 65–99)
Glucose-Capillary: 436 mg/dL — ABNORMAL HIGH (ref 65–99)

## 2015-05-12 LAB — MAGNESIUM: Magnesium: 2 mg/dL (ref 1.7–2.4)

## 2015-05-12 LAB — POTASSIUM: Potassium: 4.7 mmol/L (ref 3.5–5.1)

## 2015-05-12 MED ORDER — DM-GUAIFENESIN ER 30-600 MG PO TB12
1.0000 | ORAL_TABLET | Freq: Two times a day (BID) | ORAL | Status: DC
  Administered 2015-05-12 – 2015-05-14 (×4): 1 via ORAL
  Filled 2015-05-12 (×5): qty 1

## 2015-05-12 MED ORDER — METHYLPREDNISOLONE SODIUM SUCC 125 MG IJ SOLR
60.0000 mg | INTRAMUSCULAR | Status: DC
  Administered 2015-05-12: 60 mg via INTRAVENOUS
  Filled 2015-05-12: qty 0.96

## 2015-05-12 MED ORDER — CEFTRIAXONE SODIUM IN DEXTROSE 20 MG/ML IV SOLN
1.0000 g | INTRAVENOUS | Status: DC
  Administered 2015-05-12 – 2015-05-13 (×2): 1 g via INTRAVENOUS
  Filled 2015-05-12 (×5): qty 50

## 2015-05-12 MED ORDER — INSULIN ASPART PROT & ASPART (70-30 MIX) 100 UNIT/ML ~~LOC~~ SUSP
15.0000 [IU] | Freq: Every day | SUBCUTANEOUS | Status: DC
  Filled 2015-05-12: qty 10

## 2015-05-12 MED ORDER — INSULIN GLARGINE 100 UNIT/ML ~~LOC~~ SOLN
20.0000 [IU] | Freq: Every day | SUBCUTANEOUS | Status: DC
  Administered 2015-05-12: 20 [IU] via SUBCUTANEOUS
  Filled 2015-05-12 (×2): qty 0.2

## 2015-05-12 MED ORDER — INSULIN NPH (HUMAN) (ISOPHANE) 100 UNIT/ML ~~LOC~~ SUSP
10.0000 [IU] | Freq: Two times a day (BID) | SUBCUTANEOUS | Status: DC
  Filled 2015-05-12: qty 10

## 2015-05-12 MED ORDER — SODIUM POLYSTYRENE SULFONATE 15 GM/60ML PO SUSP
15.0000 g | Freq: Once | ORAL | Status: DC
  Filled 2015-05-12: qty 60

## 2015-05-12 MED ORDER — INSULIN ASPART 100 UNIT/ML ~~LOC~~ SOLN
5.0000 [IU] | Freq: Once | SUBCUTANEOUS | Status: AC
  Administered 2015-05-12: 5 [IU] via SUBCUTANEOUS

## 2015-05-12 MED ORDER — DEXTROSE 5 % IV SOLN
500.0000 mg | INTRAVENOUS | Status: DC
  Administered 2015-05-12: 500 mg via INTRAVENOUS
  Filled 2015-05-12 (×2): qty 500

## 2015-05-12 NOTE — Progress Notes (Signed)
Inpatient Diabetes Program Recommendations  AACE/ADA: New Consensus Statement on Inpatient Glycemic Control (2013)  Target Ranges:  Prepandial:   less than 140 mg/dL      Peak postprandial:   less than 180 mg/dL (1-2 hours)      Critically ill patients:  140 - 180 mg/dL   Results for [name], [name] (MRN [id number]) as of 05/12/2015 10:55  Ref. Range 05/11/2015 08:12 05/11/2015 12:01 05/11/2015 12:16 05/11/2015 12:52 05/11/2015 13:14 05/11/2015 15:55 05/11/2015 20:25 05/12/2015 05:51 05/12/2015 08:35  Glucose-Capillary Latest Ref Range: 65-99 mg/dL 171 (H) 67 66 52 (L) 165 (H) 132 (H) 199 (H) 325 (H) 336 (H)   Diabetes history: DM2 Outpatient Diabetes medications: Prescribed 70/30 25 units BID but note on home med list states patient takes 20 units BID Current orders for Inpatient glycemic control: Novolog 0-15 units TID with meals  Inpatient Diabetes Program Recommendations Insulin - Basal: Fasting glucose 336 mg/dl this morning. Noted 70/30 was discontinued on 6/13 due to hypoglycemia. Patient received 70/30 25 units on 6/13 at 8:26 am. Please consider reordering 70/30 at 15 units BID (with breakfast and supper) if patient is eating at least 50% of meals. If patient is not eating well, please consider ordering Lantus 20 units Q24H.  Thanks, Barnie Alderman, RN, MSN, CCRN, CDE Diabetes Coordinator Inpatient Diabetes Program [phone] (Team Pager from [place] to [place]) [phone] ([place] office) [phone] [facility] office) [phone] [facility] office)

## 2015-05-12 NOTE — Consult Note (Signed)
[facility] Medical Consultation  [name] OEU:[id number] DOB: 03/27/1932 DOA: 05/09/2015 PCP: Wardell Honour, MD   Requesting physician: DR Gwenlyn Found Date of consultation: 6/13 Reason for consultation: Bronchitis  Impression/Recommendations Principal Problem:   Unstable angina Active Problems:   DIASTOLIC HEART FAILURE, CHRONIC   DM (diabetes mellitus)   CKD (chronic kidney disease) stage 4, GFR 15-29 ml/min   HTN (hypertension)   Coronary atherosclerosis of native coronary artery: stress test 2013 showing inferior and distal anteroseptal ischemia   Anemia in chronic kidney disease   Abnormal urinalysis   Acute bronchitis due to other specified organisms   Urinary tract infectious disease   Acute diastolic CHF (congestive heart failure)   Pulmonary hypertension   Diabetes type 2, uncontrolled   Chronic kidney disease, stage IV (severe)   Other constipation   Failure to thrive in adult   Hyperkalemia   Hypomagnesemia  Acute bronchitis; -Albuterol nebulizer TID  -Solu-Medrol 60 mg daily.  -Mucinex DM  BID -Empiric antibiotics  -Sputum culture pending -Legionella antigen/strep pneumo antigen urine pending -Flutter valve q 4hr when awake  -Blood culture 2  UTI GNR positive -Most organisms would be covered by above antibiotics    Acute diastolic HF exacerbation;  -Continue with lasix. Management per cardiology.  -Strict in and out; since admission -1.5 L  Pulmonary hypertension -Management per cardiology  Diabetes type II uncontrolled; with Hypoglycemia. -Start NPH at much lower dose then home; 10 unit BID  -Continue Moderate SSI.   CKD stage IV. Cr baseline 2 to 3.  -Cr at baseline continue monitor closely especially with diuresis .   Constipation;  -Continue miralax.   FTT; related to acute illness.  -Patient eating today.  -Continue boost.   Hyperkalemia -Kayexalate 15 gm -Repeat K level= 4.7  Hypomagnesemia -Manesium Goal>2    I  will followup again tomorrow. Please contact me if I can be of assistance in the meanwhile. Thank you for this consultation.  Chief Complaint: SOB   HPI:  81 year old  WF PMHx CAD, CVA, diabetes type 2, chronic diastolic CHF, pulmonary hypertension. Her last stress test in our system was in 2013. This demonstrated evidence for inferior and distal anteroseptal ischemia. However, she was managed conservatively at that time secondary to renal insufficiency. She was admitted at [facility] most recently this year in Feb. The patient did have a slight troponin rise. However, she did not want to have a cardiac cath because of renal insufficiency.   She lives by herself and reports that she takes all of her meds. However, she cannot recall them. She still drives. I spoke with her daughter. They do not know if she is compliant with her meds but they don't think that she is taking them. Her primary MD noted recently that she ran out of Lasix. She refuses any help from family. She has been told in the past that she needs dialysis but she refuses.  She reports chest pain starting Friday. She reports that this has been similar to previous angina. She says that it has been severe. She points to her left breast. She does not report PND or orthopnea. She does not have nausea or vomitting. She denies any radiation of the pain. Her daughter drove her to [facility]. she was pain free after Morphine, NTG IV and ASA. Her BP was markedly elevated. BNP was elevated at 842. Troponin was negative. However, her EKG showed significant inferior and lateral ST depression. Review of symptoms The patient denies anorexia, fever,  weight loss,, vision loss, decreased hearing, hoarseness, chest pain, syncope, dyspnea on exertion, peripheral edema, balance deficits, hemoptysis, abdominal pain, melena, hematochezia, severe indigestion/heartburn, hematuria, incontinence, genital sores, muscle weakness, suspicious skin  lesions, transient blindness, difficulty walking, depression, unusual weight change, abnormal bleeding, enlarged lymph nodes, angioedema, and breast masses.   Consultants:   Procedure/Significant Events: 6/13 echocardiogram;Left ventricle: moderateconcentric hypertrophy.-LVEF= 55% to 60%. - (grade 2 diastolic dysfunction). - Mitral valve:  mild to moderate regurgitation. - Left atrium: moderately dilated. - Right atrium: moderately dilated. - Pulmonic valve: mild to moderate regurgitation. - Pulmonary arteries: PA peak pressure: 56 mm Hg (S).    Culture 6/12 MRSA by PCR negative 6/12 urine positive GNR   Antibiotics: Azithromycin 6/14>> Ceftriaxone 6/14>>  DVT prophylaxis:    Past Medical History  Diagnosis Date  . CAD (coronary artery disease) 2003    Last catheterization 2008. Two-vessel PCI of the first OM branch and mid LAD.  Marland Kitchen CVA (cerebral vascular accident) 1996    LEFT SIDE   . IDDM (insulin dependent diabetes mellitus)   . CKD (chronic kidney disease)   . Congestive heart failure   . Hypercholesteremia   . Hypertension   . Dementia   . DJD (degenerative joint disease) of lumbar spine    Past Surgical History  Procedure Laterality Date  . Appendectomy    . Knee surgery     Social History:  reports that she has never smoked. She has never used smokeless tobacco. She reports that she does not drink alcohol or use illicit drugs.  Allergies  Allergen Reactions  . Propoxyphene N-Acetaminophen   . Simvastatin Other (See Comments)    headache   Family History  Problem Relation Age of Onset  . Diabetes Brother     RETINOPATHY   . Drug abuse Brother   . Liver cancer Brother   . Breast cancer Daughter   . Heart attack Mother 23  . Heart attack Father 55  . Diabetes Sister     Prior to Admission medications   Medication Sig Start Date End Date Taking? Authorizing Provider  fenofibrate 54 MG tablet TAKE 1 TABLET DAILY 08/29/13  Yes [name], MD   furosemide (LASIX) 40 MG tablet Take 1 tablet (40 mg total) by mouth daily. 05/08/15  Yes Wardell Honour, MD  insulin NPH Human (HUMULIN N) 100 UNIT/ML injection Inject 0.25 mLs (25 Units total) into the skin 2 (two) times daily. Patient taking differently: Inject 20 Units into the skin 2 (two) times daily.  12/26/14  Yes Wardell Honour, MD  nitroGLYCERIN (NITROSTAT) 0.4 MG SL tablet Place 1 tablet (0.4 mg total) under the tongue every 5 (five) minutes x 3 doses as needed for chest pain. 10/08/14  Yes Minus Breeding, MD  ACCU-CHEK AVIVA PLUS test strip USE TO CHECK BLOOD SUGAR THREE TIMES A DAY AS INSTRUCTED 05/04/15   Chipper Herb, MD  amLODipine (NORVASC) 10 MG tablet TAKE ONE TABLET BY MOUTH ONE TIME DAILY 01/02/15   Wardell Honour, MD  aspirin EC 81 MG tablet Take 81 mg by mouth daily.    Historical Provider, MD  cloNIDine (CATAPRES) 0.2 MG tablet Take 1 tablet (0.2 mg total) by mouth 2 (two) times daily. 01/28/15   Sharion Balloon, FNP  furosemide (LASIX) 20 MG tablet TAKE ONE TABLET BY MOUTH ONE TIME DAILY 03/19/15   Wardell Honour, MD  isosorbide mononitrate (IMDUR) 120 MG 24 hr tablet TAKE ONE TABLET BY MOUTH ONE TIME  DAILY 04/07/15   Wardell Honour, MD  labetalol (NORMODYNE) 200 MG tablet Take 200 mg by mouth 2 (two) times daily.    Historical Provider, MD  lisinopril (PRINIVIL,ZESTRIL) 40 MG tablet Take 40 mg by mouth daily.    Historical Provider, MD  omeprazole (PRILOSEC) 20 MG capsule Take 20 mg by mouth daily.    Historical Provider, MD  ranitidine (ZANTAC) 150 MG tablet Take 150 mg by mouth 2 (two) times daily.  05/02/15   Historical Provider, MD  sodium polystyrene (KAYEXALATE) 15 GM/60ML suspension Take 15 g by mouth once.  04/07/15   Historical Provider, MD   Physical Exam: Blood pressure 134/38, pulse 66, temperature 97.5 F (36.4 C), temperature source Oral, resp. rate 17, height 5\' 4"  (1.626 m), weight 70.5 kg (155 lb 6.8 oz), SpO2 99 %. Filed Vitals:   05/12/15 1400  05/12/15 1538 05/12/15 1600 05/12/15 1706  BP: 127/40  134/38   Pulse: 61  66   Temp:    97.5 F (36.4 C)  TempSrc:    Oral  Resp:   17   Height:      Weight:      SpO2:  100% 99%      General:  A/O 4, NAD, negative acute respiratory  Eyes: Pupils equal round reactive to light and accommodation  ENT: Within normal limit  Neck: Negative JVD, negative lesions masses  Cardiovascular: Regular rhythm or rate, negative murmurs rubs or gallops  Respiratory: Inspiratory/expiratory wheezing, diffuse rhonchi, negative crackles  Abdomen: Soft, nontender, plus bowel sound  Skin: Negative lesions, lacerations  Musculoskeletal: Negative pedal edema  Neurologic: Cranial nerves II through XII intact, tongue/uvula midline, strength all extremity is 5/5, sensation intact throughout  Labs on Admission:  Basic Metabolic Panel:  Recent Labs Lab 05/08/15 1203 05/09/15 2112 05/09/15 2144 05/10/15 0335 05/12/15 0325 05/12/15 1126  NA 141 138 139 137 134*  --   K 5.1 4.5 4.6 4.7 5.6* 4.7  CL 109* 107 111 107 105  --   CO2 17* 20*  --  22 21*  --   GLUCOSE 226* 215* 214* 234* 393*  --   BUN 53* 50* 48* 44* 64*  --   CREATININE 2.50* 2.38* 2.60* 2.42* 2.89*  --   CALCIUM 9.6 10.1  --  9.7 9.3  --   MG  --   --   --   --   --  2.0   Liver Function Tests: No results for input(s): AST, ALT, ALKPHOS, BILITOT, PROT, ALBUMIN in the last 168 hours. No results for input(s): LIPASE, AMYLASE in the last 168 hours. No results for input(s): AMMONIA in the last 168 hours. CBC:  Recent Labs Lab 05/09/15 2112 05/09/15 2144 05/11/15 0250 05/11/15 0740 05/11/15 2240 05/12/15 0325  WBC 8.2  --  3.9* 4.3 5.4 5.3  HGB 9.9* 9.2* 7.0* 7.4* 9.1* 9.1*  HCT 29.9* 27.0* 21.1* 22.5* 27.4* 27.2*  MCV 92.3  --  91.7 91.1 89.3 88.9  PLT 188  --  140* 141* 138* 176   Cardiac Enzymes:  Recent Labs Lab 05/09/15 2112 05/10/15 0335 05/10/15 0958 05/10/15 1450  TROPONINI 0.03 0.08* 0.08* 0.07*    BNP: Invalid input(s): POCBNP CBG:  Recent Labs Lab 05/11/15 2025 05/12/15 0041 05/12/15 0551 05/12/15 0835 05/12/15 1307  GLUCAP 199* 285* 325* 336* 313*    Radiological Exams on Admission: No results found.  EKG:   Care during the described time interval was provided by me .  I  have reviewed this patient's available data, including medical history, events of note, physical examination, and all test results as part of my evaluation. I have personally reviewed and interpreted all radiology studies.  Time spent: 40 minutes      Allie Bossier [facility] Pager [phone]  If 7PM-7AM, please contact night-coverage [email or web address] Password [email or web address] 05/12/2015, 5:32 PM

## 2015-05-12 NOTE — Progress Notes (Signed)
ANTIBIOTIC CONSULT NOTE - INITIAL  Pharmacy Consult for rocephin Indication: pneumonia  Allergies  Allergen Reactions  . Propoxyphene N-Acetaminophen   . Simvastatin Other (See Comments)    headache    Patient Measurements: Height: 5\' 4"  (162.6 cm) Weight: 155 lb 6.8 oz (70.5 kg) IBW/kg (Calculated) : 54.7  Vital Signs: Temp: 97.5 F (36.4 C) (06/14 1706) Temp Source: Oral (06/14 1706) BP: 134/38 mmHg (06/14 1600) Pulse Rate: 66 (06/14 1600) Intake/Output from previous day: 06/13 0701 - 06/14 0700 In: 586.3 [P.O.:150; I.V.:101.3; Blood:335] Out: 0233 [IDHWY:6168] Intake/Output from this shift: Total I/O In: 477 [P.O.:477] Out: 550 [Urine:550]  Labs:  Recent Labs  05/09/15 2144 05/10/15 0335  05/11/15 0740 05/11/15 2240 05/12/15 0325  WBC  --   --   < > 4.3 5.4 5.3  HGB 9.2*  --   < > 7.4* 9.1* 9.1*  PLT  --   --   < > 141* 138* 176  CREATININE 2.60* 2.42*  --   --   --  2.89*  < > = values in this interval not displayed. Estimated Creatinine Clearance: 15 mL/min (by C-G formula based on Cr of 2.89). No results for input(s): VANCOTROUGH, VANCOPEAK, VANCORANDOM, GENTTROUGH, GENTPEAK, GENTRANDOM, TOBRATROUGH, TOBRAPEAK, TOBRARND, AMIKACINPEAK, AMIKACINTROU, AMIKACIN in the last 72 hours.   Microbiology: Recent Results (from the past 720 hour(s))  MRSA PCR Screening     Status: None   Collection Time: 05/10/15  1:01 AM  Result Value Ref Range Status   MRSA by PCR NEGATIVE NEGATIVE Final    Comment:        The GeneXpert MRSA Assay (FDA approved for NASAL specimens only), is one component of a comprehensive MRSA colonization surveillance program. It is not intended to diagnose MRSA infection nor to guide or monitor treatment for MRSA infections.   Culture, Urine     Status: None (Preliminary result)   Collection Time: 05/10/15 12:45 PM  Result Value Ref Range Status   Specimen Description URINE, RANDOM  Final   Special Requests NONE  Final   Colony  Count   Final    >=100,000 COLONIES/ML Performed at [facility]    Culture   Final    Fielding Performed at [facility]    Report Status PENDING  Incomplete    Medical History: Past Medical History  Diagnosis Date  . CAD (coronary artery disease) 2003    Last catheterization 2008. Two-vessel PCI of the first OM branch and mid LAD.  Marland Kitchen CVA (cerebral vascular accident) 1996    LEFT SIDE   . IDDM (insulin dependent diabetes mellitus)   . CKD (chronic kidney disease)   . Congestive heart failure   . Hypercholesteremia   . Hypertension   . Dementia   . DJD (degenerative joint disease) of lumbar spine    Assessment: 82 YOF to start on rocephin/azithromycin empirically for possible CAP. Afebrile, wbc wnl.   Plan:  - Rocephin 1g IV Q 24. - Azithromycin dose appropriate. - Pharmacy sign off.  Thanks.  Maryanna Shape, PharmD, BCPS  Clinical Pharmacist  Pager: [phone]  05/12/2015,5:27 PM

## 2015-05-12 NOTE — Progress Notes (Signed)
Patient having 10/10 substernal chest pain. EKG performed and 2L oxygen nasal cannula applied. Chest pain resolved quickly after 1 dose of SL nitroglycerin. Patient is now resting comfortably in bed with no complaints. Will continue to monitor closely.   Dennison Mascot

## 2015-05-12 NOTE — Progress Notes (Signed)
Patient Name: [name] Date of Encounter: 05/12/2015  Principal Problem:   Unstable angina Active Problems:   DIASTOLIC HEART FAILURE, CHRONIC   DM (diabetes mellitus)   CKD (chronic kidney disease) stage 4, GFR 15-29 ml/min   HTN (hypertension)   Coronary atherosclerosis of native coronary artery: stress test 2013 showing inferior and distal anteroseptal ischemia   Anemia in chronic kidney disease   Abnormal urinalysis   Primary Cardiologist: Dr Percival Spanish  Patient Profile: 81 yo female w/ hx presumed CAD (abnl stress 2013), med rx 2nd CKD IV, D-CHF, CVA, admitted 06/11 w/ CP, palps. Ez minimal elevation, has UTI and URI sx.  SUBJECTIVE: Feels a little better today, still coughing but congestion is loosening up.  OBJECTIVE Filed Vitals:   05/12/15 0200 05/12/15 0400 05/12/15 0755 05/12/15 0804  BP: 145/36 145/38 188/61 173/43  Pulse: 60 59 80 72  Temp:  97.7 F (36.5 C) 97.7 F (36.5 C)   TempSrc:  Oral Oral   Resp: 14 13 20    Height:      Weight:      SpO2: 96% 96% 97% 98%    Intake/Output Summary (Last 24 hours) at 05/12/15 0841 Last data filed at 05/12/15 0600  Gross per 24 hour  Intake 505.25 ml  Output   1675 ml  Net -1169.75 ml   Filed Weights   05/09/15 2059 05/10/15 0100  Weight: 155 lb (70.308 kg) 155 lb 6.8 oz (70.5 kg)    PHYSICAL EXAM General: Well developed, well nourished, female in no acute distress. Head: Normocephalic, atraumatic.  Neck: Supple without bruits, JVD 8 cm. Lungs:  Resp regular and unlabored, coarse rales, rhonchi and wheeze. Heart: RRR, S1, S2, no S3, S4, or murmur; no rub. Abdomen: Soft, non-tender, non-distended, BS + x 4.  Extremities: No clubbing, cyanosis, no edema.  Neuro: Alert and oriented X 3. Moves all extremities spontaneously. Psych: Normal affect.  LABS: CBC: Recent Labs  05/11/15 2240 05/12/15 0325  WBC 5.4 5.3  HGB 9.1* 9.1*  HCT 27.4* 27.2*  MCV 89.3 88.9  PLT 138* 176   INR: Recent  Labs  05/09/15 2112  INR 3.15   Basic Metabolic Panel: Recent Labs  05/10/15 0335 05/12/15 0325  NA 137 134*  K 4.7 5.6*  CL 107 105  CO2 22 21*  GLUCOSE 234* 393*  BUN 44* 64*  CREATININE 2.42* 2.89*  CALCIUM 9.7 9.3   Cardiac Enzymes: Recent Labs  05/10/15 0335 05/10/15 0958 05/10/15 1450  TROPONINI 0.08* 0.08* 0.07*    Recent Labs  05/09/15 2131  TROPIPOC 0.03   BNP:  B NATRIURETIC PEPTIDE  Date/Time Value Ref Range Status  05/09/2015 09:12 PM 842.0* 0.0 - 100.0 pg/mL Final   Hemoglobin A1C: Recent Labs  05/10/15 0335  HGBA1C 7.6*   Thyroid Function Tests: Recent Labs  05/10/15 0335  TSH 2.795   TELE:        Echo: 05/11/2015 Study Conclusions - Left ventricle: The cavity size was normal. There was moderate concentric hypertrophy. Systolic function was normal. The estimated ejection fraction was in the range of 55% to 60%. Wall motion was normal; there were no regional wall motion abnormalities. Features are consistent with a pseudonormal left ventricular filling pattern, with concomitant abnormal relaxation and increased filling pressure (grade 2 diastolic dysfunction). Doppler parameters are consistent with high ventricular filling pressure. - Aortic valve: Moderate focal calcification involving the noncoronary cusp. - Mitral valve: There was mild to moderate regurgitation. - Left  atrium: The atrium was moderately dilated. - Right atrium: The atrium was moderately dilated. - Pulmonic valve: There was mild to moderate regurgitation. - Pulmonary arteries: PA peak pressure: 56 mm Hg (S). Impressions: - The right ventricular systolic pressure was increased consistent with moderate pulmonary hypertension.  Current Medications:  . albuterol  2.5 mg Nebulization TID  . amLODipine  10 mg Oral Daily  . aspirin EC  81 mg Oral Daily  . cloNIDine  0.2 mg Oral BID  . diphenhydrAMINE  25 mg Oral Once  . enoxaparin (LOVENOX)  injection  30 mg Subcutaneous Q24H  . famotidine  10 mg Oral Daily  . fenofibrate  54 mg Oral Daily  . furosemide  80 mg Oral Daily  . guaiFENesin  600 mg Oral BID  . insulin aspart  0-15 Units Subcutaneous TID WC  . isosorbide mononitrate  120 mg Oral Daily  . labetalol  200 mg Oral BID  . lactose free nutrition  237 mL Oral TID WC  . pantoprazole  40 mg Oral Daily  . polyethylene glycol  17 g Oral BID  . sodium polystyrene  15 g Oral Once      ASSESSMENT AND PLAN: Principal Problem:   Unstable angina - symptoms improved, minimal ez elevation - continue med rx w/ ASA, BB - has not tolerated statins in the past  Active Problems:   DIASTOLIC HEART FAILURE, CHRONIC - PAS 56 by echo 06/13 - got Lasix totaling 80 mg 06/13 but BUN/Cr increased 44/2.42>>64/2.89 - oral Lasix dose increased 40 mg>>80 mg    DM (diabetes mellitus) - per IM    CKD (chronic kidney disease) stage 4, GFR 15-29 ml/min - per IM, follow    HTN (hypertension) - per IM    Coronary atherosclerosis of native coronary artery: stress test 2013 showing inferior and distal anteroseptal ischemia - see above    Anemia in chronic kidney disease - Improved w/ transfusion    Abnormal urinalysis - per IM, > 100,000 colonies seen, final report pending    Possible deconditioning - PT has seen, rec HH PT  Signed, Rosaria Ferries , PA-C 8:41 AM 05/12/2015   Agree with note by Rosaria Ferries PA-C  Clinically improved. Apprec TRH's input. S/P Tx 1 unit PRBCs. HBG improved. I/O neg. Now on PO Lasix at increased dose. SCr increased. Will follow this closely No further CP. On solumedrol, guaifenesin , nebs. Still has some Rhonchi and wheezes. No fever or increased WBC. Would keep on stepdown one more day then Tx to Tele. PT.   Lorretta Harp, M.D., FACP, [facility], [place] [address] [place], [place]  [address]  [phone] 05/12/2015 9:54 AM

## 2015-05-12 NOTE — Progress Notes (Signed)
Physical Therapy Treatment Patient Details Name: [name] MRN: [id number] DOB: Apr 21, 1932 Today's Date: May 14, 2015    History of Present Illness 81 year old female with PMH significant for CAD, HTN, HLD, diastolic CHF, ischemic CVA, and CKD stage IV presenting with chest pain and palpitations with [place].     PT Comments    Pt much more alert, responsive and states she is feeling better. Potassium elevated but kayexelate given this am. Pt educated for HEP and RW use. Pt with decreased RLE strength from CVA and encouraged to maximize HEP for strengthening and continue mobility with nursing. Will follow.   Follow Up Recommendations  Home health PT     Equipment Recommendations       Recommendations for Other Services       Precautions / Restrictions Precautions Precautions: Fall    Mobility  Bed Mobility Overal bed mobility: Modified Independent                Transfers Overall transfer level: Needs assistance     Sit to Stand: Supervision         General transfer comment: cues for hand placement x 2 trials  Ambulation/Gait Ambulation/Gait assistance: Supervision Ambulation Distance (Feet): 500 Feet Assistive device: Rolling walker (2 wheeled) Gait Pattern/deviations: Step-through pattern;Decreased stride length   Gait velocity interpretation: Below normal speed for age/gender General Gait Details: cues for posture and position in RW as well as directional cues   Stairs            Wheelchair Mobility    Modified Rankin (Stroke Patients Only)       Balance                                    Cognition Arousal/Alertness: Awake/alert Behavior During Therapy: WFL for tasks assessed/performed Overall Cognitive Status: Within Functional Limits for tasks assessed                      Exercises General Exercises - Lower Extremity Long Arc Quad: AROM;Seated;Both;20 reps Hip Flexion/Marching: AROM;Seated;Both;20  reps Toe Raises: AROM;Seated;Both;20 reps Heel Raises: AROM;Seated;Both;20 reps    General Comments        Pertinent Vitals/Pain Pain Assessment: No/denies pain  HR 65-76 sats 98-100% on RA BP 173/43 after ambulation    Home Living                      Prior Function            PT Goals (current goals can now be found in the care plan section) Progress towards PT goals: Progressing toward goals    Frequency       PT Plan Current plan remains appropriate    Co-evaluation             End of Session   Activity Tolerance: Patient tolerated treatment well Patient left: in chair;with call bell/phone within reach     Time: 0740-0803 PT Time Calculation (min) (ACUTE ONLY): 23 min  Charges:  $Gait Training: 8-22 mins $Therapeutic Exercise: 8-22 mins                    G Codes:      Melford Aase May 14, 2015, 8:06 AM [name], [place]

## 2015-05-13 DIAGNOSIS — I5031 Acute diastolic (congestive) heart failure: Secondary | ICD-10-CM

## 2015-05-13 DIAGNOSIS — N189 Chronic kidney disease, unspecified: Secondary | ICD-10-CM

## 2015-05-13 DIAGNOSIS — D631 Anemia in chronic kidney disease: Secondary | ICD-10-CM

## 2015-05-13 DIAGNOSIS — I270 Primary pulmonary hypertension: Secondary | ICD-10-CM

## 2015-05-13 DIAGNOSIS — J208 Acute bronchitis due to other specified organisms: Secondary | ICD-10-CM

## 2015-05-13 LAB — LEGIONELLA ANTIGEN, URINE

## 2015-05-13 LAB — BASIC METABOLIC PANEL
ANION GAP: 10 (ref 5–15)
BUN: 77 mg/dL — AB (ref 6–20)
CALCIUM: 9.4 mg/dL (ref 8.9–10.3)
CHLORIDE: 100 mmol/L — AB (ref 101–111)
CO2: 22 mmol/L (ref 22–32)
CREATININE: 3.01 mg/dL — AB (ref 0.44–1.00)
GFR, EST AFRICAN AMERICAN: 16 mL/min — AB (ref >60)
GFR, EST NON AFRICAN AMERICAN: 14 mL/min — AB (ref >60)
Glucose, Bld: 265 mg/dL — ABNORMAL HIGH (ref 65–99)
Potassium: 4.7 mmol/L (ref 3.5–5.1)
Sodium: 132 mmol/L — ABNORMAL LOW (ref 135–145)

## 2015-05-13 LAB — GLUCOSE, CAPILLARY
GLUCOSE-CAPILLARY: 339 mg/dL — AB (ref 65–99)
GLUCOSE-CAPILLARY: 273 mg/dL — AB (ref 65–99)
GLUCOSE-CAPILLARY: 292 mg/dL — AB (ref 65–99)
Glucose-Capillary: 319 mg/dL — ABNORMAL HIGH (ref 65–99)
Glucose-Capillary: 343 mg/dL — ABNORMAL HIGH (ref 65–99)
Glucose-Capillary: 288 mg/dL — ABNORMAL HIGH (ref 65–99)

## 2015-05-13 LAB — URINE CULTURE: Colony Count: >=100000

## 2015-05-13 MED ORDER — ALBUTEROL SULFATE (2.5 MG/3ML) 0.083% IN NEBU
2.5000 mg | INHALATION_SOLUTION | Freq: Four times a day (QID) | RESPIRATORY_TRACT | Status: DC
  Administered 2015-05-13: 2.5 mg via RESPIRATORY_TRACT
  Filled 2015-05-13: qty 3

## 2015-05-13 MED ORDER — HYDROCOD POLST-CPM POLST ER 10-8 MG/5ML PO SUER
5.0000 mL | Freq: Once | ORAL | Status: AC
  Administered 2015-05-13: 5 mL via ORAL
  Filled 2015-05-13: qty 5

## 2015-05-13 MED ORDER — INSULIN ASPART PROT & ASPART (70-30 MIX) 100 UNIT/ML ~~LOC~~ SUSP
16.0000 [IU] | Freq: Two times a day (BID) | SUBCUTANEOUS | Status: DC
  Administered 2015-05-13 – 2015-05-14 (×2): 16 [IU] via SUBCUTANEOUS

## 2015-05-13 MED ORDER — PREDNISONE 20 MG PO TABS
40.0000 mg | ORAL_TABLET | Freq: Every day | ORAL | Status: DC
  Filled 2015-05-13 (×3): qty 2

## 2015-05-13 MED ORDER — GUAIFENESIN-DM 100-10 MG/5ML PO SYRP
5.0000 mL | ORAL_SOLUTION | ORAL | Status: DC | PRN
  Administered 2015-05-13 – 2015-05-14 (×2): 5 mL via ORAL
  Filled 2015-05-13 (×2): qty 5

## 2015-05-13 MED ORDER — BUDESONIDE 0.25 MG/2ML IN SUSP
0.2500 mg | Freq: Two times a day (BID) | RESPIRATORY_TRACT | Status: DC
  Administered 2015-05-13 (×2): 0.25 mg via RESPIRATORY_TRACT
  Filled 2015-05-13 (×6): qty 2

## 2015-05-13 MED ORDER — ALBUTEROL SULFATE (2.5 MG/3ML) 0.083% IN NEBU
2.5000 mg | INHALATION_SOLUTION | RESPIRATORY_TRACT | Status: DC | PRN
  Administered 2015-05-14: 2.5 mg via RESPIRATORY_TRACT
  Filled 2015-05-13: qty 3

## 2015-05-13 MED ORDER — IPRATROPIUM-ALBUTEROL 0.5-2.5 (3) MG/3ML IN SOLN
3.0000 mL | Freq: Four times a day (QID) | RESPIRATORY_TRACT | Status: DC
  Administered 2015-05-13: 3 mL via RESPIRATORY_TRACT
  Filled 2015-05-13 (×2): qty 3

## 2015-05-13 MED ORDER — IPRATROPIUM BROMIDE 0.02 % IN SOLN
0.5000 mg | Freq: Four times a day (QID) | RESPIRATORY_TRACT | Status: DC
  Administered 2015-05-13: 0.5 mg via RESPIRATORY_TRACT
  Filled 2015-05-13: qty 2.5

## 2015-05-13 NOTE — Significant Event (Signed)
Patient has been transferred to 2W27, taken by Fraser Din NT-via wheelchair. VS stable prior to transfer. Report given to receiving RN Montine Circle. No personal belongings at bedside that could be taken up to her new room. Patient's family (daughter-Varanda) made aware of the transfer and the room number.

## 2015-05-13 NOTE — Progress Notes (Signed)
Subjective:  Clinically improved. No CP. SOB better. Occassional coughing  Objective:  Temp:  [97.2 F (36.2 C)-97.5 F (36.4 C)] 97.2 F (36.2 C) (06/15 0738) Pulse Rate:  [33-90] 56 (06/15 1000) Resp:  [10-19] 14 (06/15 1000) BP: (127-166)/(36-66) 144/66 mmHg (06/15 1000) SpO2:  [95 %-100 %] 100 % (06/15 1000) Weight change:   Intake/Output from previous day: 06/14 0701 - 06/15 0700 In: 1254 [P.O.:954; IV Piggyback:300] Out: 1300 [Urine:1300]  Intake/Output from this shift: Total I/O In: 240 [P.O.:240] Out: -   Physical Exam: General appearance: alert and no distress Neck: no adenopathy, no carotid bruit, no JVD, supple, symmetrical, trachea midline and thyroid not enlarged, symmetric, no tenderness/mass/nodules Lungs: Scattered wheezing and rhonchi Heart: regular rate and rhythm, S1, S2 normal, no murmur, click, rub or gallop Extremities: Trace edema  Lab Results: Results for orders placed or performed during the hospital encounter of 05/09/15 (from the past 48 hour(s))  Glucose, capillary     Status: None   Collection Time: 05/11/15 12:01 PM  Result Value Ref Range   Glucose-Capillary 67 65 - 99 mg/dL   Comment 1 Capillary Specimen   Glucose, capillary     Status: None   Collection Time: 05/11/15 12:16 PM  Result Value Ref Range   Glucose-Capillary 66 65 - 99 mg/dL   Comment 1 Capillary Specimen   Type and screen     Status: None (Preliminary result)   Collection Time: 05/11/15 12:33 PM  Result Value Ref Range   ABO/RH(D) A POS    Antibody Screen POS    Sample Expiration 05/14/2015    DAT, IgG NEG    Antibody Identification NO CLINICALLY SIGNIFICANT ANTIBODY IDENTIFIED    Unit Number [id number]    Blood Component Type RED CELLS,LR    Unit division 00    Status of Unit ALLOCATED    Transfusion Status OK TO TRANSFUSE    Crossmatch Result COMPATIBLE    Unit Number [id number]    Blood Component Type RED CELLS,LR    Unit division 00    Status  of Unit ISSUED,FINAL    Transfusion Status OK TO TRANSFUSE    Crossmatch Result COMPATIBLE   Prepare RBC     Status: None   Collection Time: 05/11/15 12:33 PM  Result Value Ref Range   Order Confirmation ORDER PROCESSED BY BLOOD BANK   Glucose, capillary     Status: Abnormal   Collection Time: 05/11/15 12:52 PM  Result Value Ref Range   Glucose-Capillary 52 (L) 65 - 99 mg/dL   Comment 1 Capillary Specimen   Glucose, capillary     Status: Abnormal   Collection Time: 05/11/15  1:14 PM  Result Value Ref Range   Glucose-Capillary 165 (H) 65 - 99 mg/dL  Glucose, capillary     Status: Abnormal   Collection Time: 05/11/15  3:55 PM  Result Value Ref Range   Glucose-Capillary 132 (H) 65 - 99 mg/dL   Comment 1 Capillary Specimen   Glucose, capillary     Status: Abnormal   Collection Time: 05/11/15  8:25 PM  Result Value Ref Range   Glucose-Capillary 199 (H) 65 - 99 mg/dL   Comment 1 Capillary Specimen   CBC     Status: Abnormal   Collection Time: 05/11/15 10:40 PM  Result Value Ref Range   WBC 5.4 4.0 - 10.5 K/uL   RBC 3.07 (L) 3.87 - 5.11 MIL/uL   Hemoglobin 9.1 (L) 12.0 - 15.0 g/dL   HCT  27.4 (L) 36.0 - 46.0 %   MCV 89.3 78.0 - 100.0 fL   MCH 29.6 26.0 - 34.0 pg   MCHC 33.2 30.0 - 36.0 g/dL   RDW 13.1 11.5 - 15.5 %   Platelets 138 (L) 150 - 400 K/uL  Glucose, capillary     Status: Abnormal   Collection Time: 05/12/15 12:41 AM  Result Value Ref Range   Glucose-Capillary 285 (H) 65 - 99 mg/dL   Comment 1 Capillary Specimen   Basic metabolic panel     Status: Abnormal   Collection Time: 05/12/15  3:25 AM  Result Value Ref Range   Sodium 134 (L) 135 - 145 mmol/L   Potassium 5.6 (H) 3.5 - 5.1 mmol/L   Chloride 105 101 - 111 mmol/L   CO2 21 (L) 22 - 32 mmol/L   Glucose, Bld 393 (H) 65 - 99 mg/dL   BUN 64 (H) 6 - 20 mg/dL   Creatinine, Ser 2.89 (H) 0.44 - 1.00 mg/dL   Calcium 9.3 8.9 - 10.3 mg/dL   GFR calc non Af Amer 14 (L) >60 mL/min   GFR calc Af Amer 17 (L) >60 mL/min     Comment: (NOTE) The eGFR has been calculated using the CKD EPI equation. This calculation has not been validated in all clinical situations. eGFR's persistently <60 mL/min signify possible Chronic Kidney Disease.    Anion gap 8 5 - 15  CBC     Status: Abnormal   Collection Time: 05/12/15  3:25 AM  Result Value Ref Range   WBC 5.3 4.0 - 10.5 K/uL   RBC 3.06 (L) 3.87 - 5.11 MIL/uL   Hemoglobin 9.1 (L) 12.0 - 15.0 g/dL   HCT 27.2 (L) 36.0 - 46.0 %   MCV 88.9 78.0 - 100.0 fL   MCH 29.7 26.0 - 34.0 pg   MCHC 33.5 30.0 - 36.0 g/dL   RDW 13.2 11.5 - 15.5 %   Platelets 176 150 - 400 K/uL  Glucose, capillary     Status: Abnormal   Collection Time: 05/12/15  5:51 AM  Result Value Ref Range   Glucose-Capillary 325 (H) 65 - 99 mg/dL   Comment 1 Capillary Specimen   Glucose, capillary     Status: Abnormal   Collection Time: 05/12/15  8:35 AM  Result Value Ref Range   Glucose-Capillary 336 (H) 65 - 99 mg/dL  Potassium     Status: None   Collection Time: 05/12/15 11:26 AM  Result Value Ref Range   Potassium 4.7 3.5 - 5.1 mmol/L  Magnesium     Status: None   Collection Time: 05/12/15 11:26 AM  Result Value Ref Range   Magnesium 2.0 1.7 - 2.4 mg/dL  Glucose, capillary     Status: Abnormal   Collection Time: 05/12/15  1:07 PM  Result Value Ref Range   Glucose-Capillary 313 (H) 65 - 99 mg/dL   Comment 1 Capillary Specimen   Glucose, capillary     Status: Abnormal   Collection Time: 05/12/15  5:10 PM  Result Value Ref Range   Glucose-Capillary 342 (H) 65 - 99 mg/dL   Comment 1 Capillary Specimen   Glucose, capillary     Status: Abnormal   Collection Time: 05/12/15  8:14 PM  Result Value Ref Range   Glucose-Capillary 436 (H) 65 - 99 mg/dL   Comment 1 Capillary Specimen   Basic metabolic panel     Status: Abnormal   Collection Time: 05/12/15  8:25 PM  Result Value  Ref Range   Sodium 129 (L) 135 - 145 mmol/L   Potassium 4.6 3.5 - 5.1 mmol/L   Chloride 99 (L) 101 - 111 mmol/L   CO2  19 (L) 22 - 32 mmol/L   Glucose, Bld 414 (H) 65 - 99 mg/dL   BUN 76 (H) 6 - 20 mg/dL   Creatinine, Ser 3.06 (H) 0.44 - 1.00 mg/dL   Calcium 9.4 8.9 - 10.3 mg/dL   GFR calc non Af Amer 13 (L) >60 mL/min   GFR calc Af Amer 16 (L) >60 mL/min    Comment: (NOTE) The eGFR has been calculated using the CKD EPI equation. This calculation has not been validated in all clinical situations. eGFR's persistently <60 mL/min signify possible Chronic Kidney Disease.    Anion gap 11 5 - 15  Legionella antigen, urine     Status: None   Collection Time: 05/12/15  8:43 PM  Result Value Ref Range   Specimen Description URINE, RANDOM    Special Requests NONE    Legionella Antigen, Urine      Negative for Legionella pneumophila serogroup 1                                                              Legionella pneumophila serogroup 1 antigen can be detected in urine within 2 to 3 days of infection and may persist even after treatment. This  assay does not detect other Legionella species or serogroups. Performed at [facility]    Report Status 05/13/2015 FINAL   Strep pneumoniae urinary antigen     Status: None   Collection Time: 05/12/15  8:43 PM  Result Value Ref Range   Strep Pneumo Urinary Antigen NEGATIVE NEGATIVE    Comment:        Infection due to S. pneumoniae cannot be absolutely ruled out since the antigen present may be below the detection limit of the test.   Glucose, capillary     Status: Abnormal   Collection Time: 05/12/15  9:50 PM  Result Value Ref Range   Glucose-Capillary 339 (H) 65 - 99 mg/dL   Comment 1 Capillary Specimen   Basic metabolic panel     Status: Abnormal   Collection Time: 05/13/15  2:14 AM  Result Value Ref Range   Sodium 132 (L) 135 - 145 mmol/L   Potassium 4.7 3.5 - 5.1 mmol/L   Chloride 100 (L) 101 - 111 mmol/L   CO2 22 22 - 32 mmol/L   Glucose, Bld 265 (H) 65 - 99 mg/dL   BUN 77 (H) 6 - 20 mg/dL   Creatinine, Ser 3.01 (H) 0.44 - 1.00 mg/dL    Calcium 9.4 8.9 - 10.3 mg/dL   GFR calc non Af Amer 14 (L) >60 mL/min   GFR calc Af Amer 16 (L) >60 mL/min    Comment: (NOTE) The eGFR has been calculated using the CKD EPI equation. This calculation has not been validated in all clinical situations. eGFR's persistently <60 mL/min signify possible Chronic Kidney Disease.    Anion gap 10 5 - 15  Glucose, capillary     Status: Abnormal   Collection Time: 05/13/15  7:37 AM  Result Value Ref Range   Glucose-Capillary 319 (H) 65 - 99 mg/dL    Imaging: Imaging results  have been reviewed Tele- NSR  Assessment/Plan:   1. Principal Problem: 2.   Unstable angina 3. Active Problems: 4.   DIASTOLIC HEART FAILURE, CHRONIC 5.   DM (diabetes mellitus) 6.   CKD (chronic kidney disease) stage 4, GFR 15-29 ml/min 7.   HTN (hypertension) 8.   Coronary atherosclerosis of native coronary artery: stress test 2013 showing inferior and distal anteroseptal ischemia 9.   Anemia in chronic kidney disease 10.   Abnormal urinalysis 11.   Acute bronchitis due to other specified organisms 12.   Urinary tract infectious disease 13.   Acute diastolic CHF (congestive heart failure) 14.   Pulmonary hypertension 15.   Diabetes type 2, uncontrolled 16.   Chronic kidney disease, stage IV (severe) 17.   Other constipation 18.   Failure to thrive in adult 64.   Hyperkalemia 20.   Hypomagnesemia 21.   Time Spent Directly with Patient:  20 minutes  Length of Stay:  LOS: 4 days   Admitted with [place] and EKG changes. Enz neg. EF nl by 2D. Was on IV hep/NTG since D/Cd. No recurrent CP. On empiric ATBX for bronchitis, Nebs and steroids per TRH. Lungs sound better. SCr slightly higher but stable. Diuresed. Lasix adjusted. Ambulated around Unit yesterday. OK to Tx tele. PT. Prom home tomorrow. No plans for cath at this time.  Quay Burow 05/13/2015, 11:10 AM

## 2015-05-13 NOTE — Progress Notes (Addendum)
[place] TEAM 1 - Stepdown/ICU TEAM CONSULT F/U Note  ANABELA CRAYTON VCB:[id number] DOB: 1932-04-10 DOA: 05/09/2015 PCP: Wardell Honour, MD  Admit HPI / Brief Narrative: 81 year old F Hx CAD (stress test 2013 demonstrated inferior and distal anteroseptal ischemia > managed conservatively due to renal insufficiency), CVA, DM2, chronic diastolic CHF, pulmonary hypertension who was admitted to the Cardiology service 6/11 with c/o CP similar to previous angina. Her daughter drove her to [facility] ED and she became pain free after Morphine, NTG IV, and ASA. Her EKG showed significant inferior and lateral ST depression.  TRH was asked to see her in consultation to address her multiple chronic medical issues.  HPI/Subjective: The patient is sitting comfortably in a bedside chair.  She reports that she still feels somewhat short of breath but that she is slightly improved since her admission.  She denies current chest pain nausea vomiting or abdominal pain.  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 - no evidence of focal pneumonia - continue aggressive bronchial dilators - wean steroids as soon as possible in setting of uncontrolled diabetes-   Klebsiella pneumoniae UTI  Proven to be susceptible to ceftriaxone - plan to complete 7 day treatment course  Chronic diastolic congestive heart failure Management per cardiology  Pulmonary hypertension Management per cardiology  Diabetes type II uncontrolled - hypoglycemia and hyperglycemia Hypoglycemia resolved but patient's CBGs are now very poorly controlled - resume 70/30 insulin but at lower dose than home regimen  HTN Blood pressure elevated beyond goal - medical therapy adjusted - follow trend  CKD  stage IV Cr baseline 2 to 3 - creatinine at upper end of usual range - follow with ongoing diuresis  Anemia of chronic kidney disease Status post 1 unit PRBC 6/14 - Hgb at goal (8.0 or >)  Constipation Continue miralax   Hyperkalemia Stable status post Kayexalate   Hypomagnesemia Magnesium at goal of 2.0   Deconditioning  PT/OT at discretion of primary service  Code Status: FULL Family Communication: no family present at time of exam  Antibiotics: Azithromycin 6/14 > 6/15 Ceftriaxone 6/14 >  DVT prophylaxis: Lovenox  Objective: Blood pressure 158/36, pulse 33, temperature 97.2 F (36.2 C), temperature source Oral, resp. rate 16, height 5\' 4"  (1.626 m), weight 70.5 kg (155 lb 6.8 oz), SpO2 100 %.  Intake/Output Summary (Last 24 hours) at 05/13/15 1042 Last data filed at 05/13/15 0952  Gross per 24 hour  Intake   1137 ml  Output   1000 ml  Net    137 ml   Exam: General:  respirations are comfortable Lungs: Coarse diffuse upper airway sounds transmitted throughout with diffuse mild expiratory wheeze without focal crackles  Cardiovascular: Regular rate and rhythm without murmur gallop or rub normal S1 and S2 Abdomen: Nontender, nondistended, soft, bowel sounds positive, no rebound, no ascites, no appreciable mass Extremities: No significant cyanosis, clubbing;  trace edema bilateral lower extremities  Data Reviewed: Basic Metabolic Panel:  Recent Labs Lab 05/09/15 2112 05/09/15 2144 05/10/15 0335 05/12/15 0325 05/12/15 1126 05/12/15 2025 05/13/15 0214  NA 138 139 137 134*  --  129* 132*  K 4.5 4.6 4.7 5.6* 4.7 4.6 4.7  CL 107 111 107 105  --  99* 100*  CO2 20*  --  22 21*  --  19* 22  GLUCOSE 215* 214* 234* 393*  --  414* 265*  BUN 50* 48* 44* 64*  --  76* 77*  CREATININE 2.38* 2.60* 2.42* 2.89*  --  3.06* 3.01*  CALCIUM 10.1  --  9.7 9.3  --  9.4 9.4  MG  --   --   --   --  2.0  --   --     CBC:  Recent Labs Lab 05/09/15 2112 05/09/15 2144  05/11/15 0250 05/11/15 0740 05/11/15 2240 05/12/15 0325  WBC 8.2  --  3.9* 4.3 5.4 5.3  HGB 9.9* 9.2* 7.0* 7.4* 9.1* 9.1*  HCT 29.9* 27.0* 21.1* 22.5* 27.4* 27.2*  MCV 92.3  --  91.7 91.1 89.3 88.9  PLT 188  --  140* 141* 138* 176    Liver Function Tests: No results for input(s): AST, ALT, ALKPHOS, BILITOT, PROT, ALBUMIN in the last 168 hours. No results for input(s): LIPASE, AMYLASE in the last 168 hours. No results for input(s): AMMONIA in the last 168 hours.  Coags:  Recent Labs Lab 05/09/15 2112  INR 1.06   Cardiac Enzymes:  Recent Labs Lab 05/09/15 2112 05/10/15 0335 05/10/15 0958 05/10/15 1450  TROPONINI 0.03 0.08* 0.08* 0.07*    CBG:  Recent Labs Lab 05/12/15 1307 05/12/15 1710 05/12/15 2014 05/12/15 2150 05/13/15 0737  GLUCAP 313* 342* 436* 339* 319*    Recent Results (from the past 240 hour(s))  MRSA PCR Screening     Status: None   Collection Time: 05/10/15  1:01 AM  Result Value Ref Range Status   MRSA by PCR NEGATIVE NEGATIVE Final    Comment:        The GeneXpert MRSA Assay (FDA approved for NASAL specimens only), is one component of a comprehensive MRSA colonization surveillance program. It is not intended to diagnose MRSA infection nor to guide or monitor treatment for MRSA infections.   Culture, Urine     Status: None   Collection Time: 05/10/15 12:45 PM  Result Value Ref Range Status   Specimen Description URINE, RANDOM  Final   Special Requests NONE  Final   Colony Count   Final    >=100,000 COLONIES/ML Performed at [facility]    Culture   Final    KLEBSIELLA PNEUMONIAE Performed at [facility]    Report Status 05/13/2015 FINAL  Final   Organism ID, Bacteria KLEBSIELLA PNEUMONIAE  Final      Susceptibility   Klebsiella pneumoniae - MIC*    AMPICILLIN RESISTANT      CEFAZOLIN <=4 SENSITIVE Sensitive     CEFTRIAXONE <=1 SENSITIVE Sensitive     CIPROFLOXACIN <=0.25 SENSITIVE Sensitive     GENTAMICIN  <=1 SENSITIVE Sensitive     LEVOFLOXACIN <=0.12 SENSITIVE Sensitive     NITROFURANTOIN <=16 SENSITIVE Sensitive     TOBRAMYCIN <=1 SENSITIVE Sensitive     TRIMETH/SULFA <=20 SENSITIVE Sensitive     PIP/TAZO <=4 SENSITIVE Sensitive     * KLEBSIELLA PNEUMONIAE     Studies:   Recent x-ray studies have been reviewed in detail by the Attending Physician  Scheduled Meds:  Scheduled Meds: . albuterol  2.5 mg Nebulization TID  . amLODipine  10 mg Oral Daily  . aspirin EC  81 mg Oral Daily  . azithromycin  500 mg Intravenous Q24H  . cefTRIAXone (ROCEPHIN)  IV  1 g Intravenous Q24H  . cloNIDine  0.2 mg Oral BID  . dextromethorphan-guaiFENesin  1 tablet Oral BID  .  diphenhydrAMINE  25 mg Oral Once  . enoxaparin (LOVENOX) injection  30 mg Subcutaneous Q24H  . famotidine  10 mg Oral Daily  . fenofibrate  54 mg Oral Daily  . furosemide  80 mg Oral Daily  . insulin aspart  0-15 Units Subcutaneous TID WC  . insulin glargine  20 Units Subcutaneous QHS  . isosorbide mononitrate  120 mg Oral Daily  . labetalol  200 mg Oral BID  . lactose free nutrition  237 mL Oral TID WC  . methylPREDNISolone (SOLU-MEDROL) injection  60 mg Intravenous Q24H  . pantoprazole  40 mg Oral Daily  . polyethylene glycol  17 g Oral BID  . sodium polystyrene  15 g Oral Once    Time spent on care of this patient: 35 mins   MCCLUNG,JEFFREY T , MD   [facility] Office  [phone] Pager - Text Page per Shea Evans as per below:  On-Call/Text Page:      [email or web address]      password [email or web address]  If 7PM-7AM, please contact night-coverage [email or web address] Password [email or web address] 05/13/2015, 10:42 AM   LOS: 4 days

## 2015-05-13 NOTE — Care Management Note (Addendum)
Case Management Note  Patient Details  Name: [name] MRN: [id number] Date of Birth: 28-Nov-1932  Subjective/Objective:      Pt lives alone, dtrs live nearby and check on her every day.  Pt will need home health PT and RN and agrees to services.  Provided list of home health agencies - pt requests services from [facility], her dtr's employer.  After talking with the owner of that agency, explained to her that the agency is not medicare certified and cannot provide skilled intermittent visits.  Referral then made to [place] per choice.              Expected Discharge Date:  05/14/15               Expected Discharge Plan:  [place]  In-House Referral:     Discharge planning Services  CM Consult  Post Acute Care Choice:  Home Health Choice offered to:     DME Arranged:    DME Agency:     HH Arranged:  RN, PT [place] Agency:  [place]  Status of Service:  Completed, signed off  Medicare Important Message Given:  Yes Date Medicare IM Given:  05/13/15 Medicare IM give by:  Babette Relic RN Date Additional Medicare IM Given:    Additional Medicare Important Message give by:     If discussed at [place] of Stay Meetings, dates discussed:    Additional Comments:  Girard Cooter, RN 05/13/2015, 12:34 PM

## 2015-05-14 ENCOUNTER — Telehealth: Payer: Self-pay | Admitting: Physician Assistant

## 2015-05-14 ENCOUNTER — Other Ambulatory Visit: Payer: Self-pay | Admitting: Physician Assistant

## 2015-05-14 DIAGNOSIS — I5031 Acute diastolic (congestive) heart failure: Secondary | ICD-10-CM

## 2015-05-14 DIAGNOSIS — E1165 Type 2 diabetes mellitus with hyperglycemia: Secondary | ICD-10-CM

## 2015-05-14 DIAGNOSIS — N390 Urinary tract infection, site not specified: Secondary | ICD-10-CM

## 2015-05-14 LAB — BASIC METABOLIC PANEL
ANION GAP: 12 (ref 5–15)
BUN: 98 mg/dL — AB (ref 6–20)
CALCIUM: 9.6 mg/dL (ref 8.9–10.3)
CHLORIDE: 102 mmol/L (ref 101–111)
CO2: 21 mmol/L — ABNORMAL LOW (ref 22–32)
CREATININE: 3.17 mg/dL — AB (ref 0.44–1.00)
GFR calc Af Amer: 15 mL/min — ABNORMAL LOW (ref >60)
GFR, EST NON AFRICAN AMERICAN: 13 mL/min — AB (ref >60)
Glucose, Bld: 249 mg/dL — ABNORMAL HIGH (ref 65–99)
Potassium: 4.9 mmol/L (ref 3.5–5.1)
Sodium: 135 mmol/L (ref 135–145)

## 2015-05-14 LAB — GLUCOSE, CAPILLARY
Glucose-Capillary: 258 mg/dL — ABNORMAL HIGH (ref 65–99)
Glucose-Capillary: 107 mg/dL — ABNORMAL HIGH (ref 65–99)

## 2015-05-14 MED ORDER — IPRATROPIUM-ALBUTEROL 0.5-2.5 (3) MG/3ML IN SOLN: 3.0000 mL | RESPIRATORY_TRACT | Status: DC | PRN

## 2015-05-14 MED ORDER — DM-GUAIFENESIN ER 30-600 MG PO TB12: 1.0000 | ORAL_TABLET | Freq: Two times a day (BID) | ORAL | Status: DC

## 2015-05-14 MED ORDER — PREDNISONE 10 MG PO TABS: 10.0000 mg | ORAL_TABLET | Freq: Every day | ORAL | Status: DC

## 2015-05-14 MED ORDER — FUROSEMIDE 40 MG PO TABS: 40.0000 mg | ORAL_TABLET | Freq: Every day | ORAL | Status: DC

## 2015-05-14 MED ORDER — LEVOFLOXACIN 250 MG PO TABS: 250.0000 mg | ORAL_TABLET | ORAL | Status: DC

## 2015-05-14 MED ORDER — POLYETHYLENE GLYCOL 3350 17 G PO PACK: 17.0000 g | PACK | Freq: Every day | ORAL | Status: DC | PRN

## 2015-05-14 MED ORDER — LEVOFLOXACIN 250 MG PO TABS
250.0000 mg | ORAL_TABLET | ORAL | Status: DC
Start: 2015-05-16 — End: 2015-06-12

## 2015-05-14 MED ORDER — LEVOFLOXACIN 500 MG PO TABS
500.0000 mg | ORAL_TABLET | Freq: Once | ORAL | Status: AC
  Administered 2015-05-14: 500 mg via ORAL
  Filled 2015-05-14: qty 1

## 2015-05-14 MED ORDER — CIPROFLOXACIN HCL 500 MG PO TABS: 500.0000 mg | ORAL_TABLET | Freq: Two times a day (BID) | ORAL | Status: DC

## 2015-05-14 MED ORDER — IPRATROPIUM-ALBUTEROL 0.5-2.5 (3) MG/3ML IN SOLN
3.0000 mL | Freq: Four times a day (QID) | RESPIRATORY_TRACT | Status: DC
  Administered 2015-05-14: 3 mL via RESPIRATORY_TRACT
  Filled 2015-05-14: qty 3

## 2015-05-14 MED ORDER — IPRATROPIUM-ALBUTEROL 20-100 MCG/ACT IN AERS: 2.0000 | INHALATION_SPRAY | Freq: Four times a day (QID) | RESPIRATORY_TRACT | Status: DC | PRN

## 2015-05-14 MED ORDER — BOOST PLUS PO LIQD: 237.0000 mL | Freq: Three times a day (TID) | ORAL | Status: DC

## 2015-05-14 MED ORDER — INSULIN NPH (HUMAN) (ISOPHANE) 100 UNIT/ML ~~LOC~~ SUSP: 20.0000 [IU] | Freq: Two times a day (BID) | SUBCUTANEOUS | Status: DC

## 2015-05-14 MED ORDER — IPRATROPIUM-ALBUTEROL 20-100 MCG/ACT IN AERS
2.0000 | INHALATION_SPRAY | Freq: Four times a day (QID) | RESPIRATORY_TRACT | Status: DC | PRN
  Filled 2015-05-14: qty 4

## 2015-05-14 NOTE — Discharge Summary (Signed)
CARDIOLOGY DISCHARGE SUMMARY   Patient ID: [name] MRN: [id number] DOB/AGE: June 22, 1932 81 y.o.  Admit date: 05/09/2015 Discharge date: 05/14/2015  PCP: Wardell Honour, MD Primary Cardiologist: Dr. Percival Spanish  Primary Discharge Diagnosis:  Unstable angina, medical therapy Secondary Discharge Diagnosis:    DIASTOLIC HEART FAILURE, CHRONIC - discharge weight 155 pounds   DM (diabetes mellitus)   CKD (chronic kidney disease) stage 4, GFR 15-29 ml/min   HTN (hypertension)   Coronary atherosclerosis of native coronary artery: stress test 2013 showing inferior and distal anteroseptal ischemia   Anemia in chronic kidney disease   Abnormal urinalysis   Acute bronchitis due to other specified organisms   Urinary tract infectious disease   Acute diastolic CHF (congestive heart failure)   Pulmonary hypertension   Diabetes type 2, uncontrolled   Other constipation   Failure to thrive in adult   Hyperkalemia   Hypomagnesemia   Consults: Internal medicine  Procedures: 2-D echocardiogram  Hospital Course: [name] is a 81 y.o. female with a history of presumed CAD (abnl stress 2013), med rx 2nd CKD IV, D-CHF, and CVA. She came to the hospital with chest pain, palpitations as well as possible UTI and upper respiratory symptoms. She was admitted for further evaluation and treatment.  She had minimal elevation in her troponins. She was treated medically for CAD but she has consistently refused catheterization because of concerns for contrast-induced nephropathy. By discharge, she was still having some intermittent chest pain, but it was not exertional and her symptoms were generally well-controlled.  Her initial chest x-ray showed some extra volume. She was given IV Lasix for 1 day and her home oral Lasix dose was increased. Her volume status was felt to be at baseline and her discharge weight is 155 pounds. However, with the increased in her BUN/Cr over her hospital stay,  she is to hold the Lasix for 3 days and restart at her home dose.   Her renal function was followed closely during her hospital stay. Her BUN and creatinine were trending up and this will be followed closely as an outpatient. She had no dye studies. Her lisinopril is discontinued.  She was noted to be anemic on admission with hemoglobin of 9.2 and hematocrit of 27. The next day, her hemoglobin and hematocrit were low at 7.0 and 21.1. She was transfused 2 units and improved. She is to follow-up with primary care for this.  Her diabetes was managed with a combination of her home regimen and sliding scale. An A1c was checked and was elevated at 7.6. Dietary and medication compliance is encouraged and she is to follow-up with her primary care physician.  She developed hyperkalemia and was given one dose of Kayexalate. She is not on an ACE inhibitor or ARB because of her poor renal function and was not receiving potassium supplementation. This will be followed as an outpatient.  A urinalysis was performed on admission that showed moderate leukocytes, greater than 300 protein and 11-20 WBCs. A urine culture was done and was greater than 100,000 colonies Klebsiella pneumoniae. It is responsive to antibiotics she is currently receiving.  She was having problems with cough, wheezing and shortness of breath on admission. This continued despite diuresis. An internal medicine consult was called to manage this and other issues. She was given IV steroids and transitioned to an oral taper. She was started on antibiotics and nebulizers. By discharge, she was on an inhaler, oral antibiotics and steroid taper. Her respiratory  status was much improved.  She was also felt to have failure to thrive in an adult and was started on nutritional supplements. It is hoped she will continue these as an outpatient as well. She was seen by physical therapy who recommended outpatient physical therapy and this was ordered.   Because  of the changes in her medications and medical status, home health RN was ordered. She will also have HHPT.   On 06/16, she was seen by Dr Gwenlyn Found and all data were reviewed. No further inpatient workup was indicated and she is considered stable for discharge, with close follow-up as an outpatient arranged.   Labs:   Lab Results  Component Value Date   WBC 5.3 05/12/2015   HGB 9.1* 05/12/2015   HCT 27.2* 05/12/2015   MCV 88.9 05/12/2015   PLT 176 05/12/2015     Recent Labs Lab 05/14/15 0506  NA 135  K 4.9  CL 102  CO2 21*  BUN 98*  CREATININE 3.17*  CALCIUM 9.6  GLUCOSE 249*   Lab Results  Component Value Date   TROPONINI 0.07* 05/10/2015   Lab Results  Component Value Date   HGBA1C 7.6* 05/10/2015   B NATRIURETIC PEPTIDE  Date/Time Value Ref Range Status  05/09/2015 09:12 PM 842.0* 0.0 - 100.0 pg/mL Final     Radiology: Dg Chest Port 1 View 05/09/2015   CLINICAL DATA:  Midsternal chest pain, onset yesterday. Dyspnea and weakness.  EXAM: PORTABLE CHEST - 1 VIEW  COMPARISON:  01/04/2015  FINDINGS: There is mild unchanged cardiomegaly and aortic tortuosity. There are mild vascular and interstitial congestive changes consistent with mild CHF. There is no confluent airspace consolidation. There is no large effusion.  IMPRESSION: Mild vascular and interstitial congestive changes.   Electronically Signed   By: Andreas Newport M.D.   On: 05/09/2015 21:59   EKG: 12-May-2015 Sinus rhythm with 1st degree A-V block with occasional Premature ventricular complexes Septal infarct , age undetermined Incomplete left bundle branch block No significant change since last tracing Vent. rate 86 BPM PR interval 230 ms QRS duration 116 ms QT/QTc 378/452 ms P-R-T axes 41 4 138  Echo: 05/11/2015 Study Conclusions - Left ventricle: The cavity size was normal. There was moderate concentric hypertrophy. Systolic function was normal. The estimated ejection fraction was in the range of  55% to 60%. Wall motion was normal; there were no regional wall motion abnormalities. Features are consistent with a pseudonormal left ventricular filling pattern, with concomitant abnormal relaxation and increased filling pressure (grade 2 diastolic dysfunction). Doppler parameters are consistent with high ventricular filling pressure. - Aortic valve: Moderate focal calcification involving the noncoronary cusp. - Mitral valve: There was mild to moderate regurgitation. - Left atrium: The atrium was moderately dilated. - Right atrium: The atrium was moderately dilated. - Pulmonic valve: There was mild to moderate regurgitation. - Pulmonary arteries: PA peak pressure: 56 mm Hg (S). Impressions: - The right ventricular systolic pressure was increased consistent with moderate pulmonary hypertension.  FOLLOW UP PLANS AND APPOINTMENTS Allergies  Allergen Reactions  . Propoxyphene N-Acetaminophen   . Simvastatin Other (See Comments)    headache     Medication List    STOP taking these medications        lisinopril 40 MG tablet  Commonly known as:  PRINIVIL,ZESTRIL      TAKE these medications        ACCU-CHEK AVIVA PLUS test strip  Generic drug:  glucose blood  USE TO CHECK BLOOD SUGAR  THREE TIMES A DAY AS INSTRUCTED     amLODipine 10 MG tablet  Commonly known as:  NORVASC  TAKE ONE TABLET BY MOUTH ONE TIME DAILY     aspirin EC 81 MG tablet  Take 81 mg by mouth daily.     cloNIDine 0.2 MG tablet  Commonly known as:  CATAPRES  Take 1 tablet (0.2 mg total) by mouth 2 (two) times daily.     dextromethorphan-guaiFENesin 30-600 MG per 12 hr tablet  Commonly known as:  MUCINEX DM  Take 1 tablet by mouth 2 (two) times daily.     fenofibrate 54 MG tablet  TAKE 1 TABLET DAILY     furosemide 40 MG tablet  Commonly known as:  LASIX  Take 1 tablet (40 mg total) by mouth daily.  Start taking on:  05/18/2015     insulin NPH Human 100 UNIT/ML injection    Commonly known as:  HUMULIN N  Inject 0.2 mLs (20 Units total) into the skin 2 (two) times daily.     Ipratropium-Albuterol 20-100 MCG/ACT Aers respimat  Commonly known as:  COMBIVENT  Inhale 2 puffs into the lungs every 6 (six) hours as needed for wheezing.     isosorbide mononitrate 120 MG 24 hr tablet  Commonly known as:  IMDUR  TAKE ONE TABLET BY MOUTH ONE TIME DAILY     labetalol 200 MG tablet  Commonly known as:  NORMODYNE  Take 200 mg by mouth 2 (two) times daily.     lactose free nutrition Liqd  Take 237 mLs by mouth 3 (three) times daily with meals.     levofloxacin 250 MG tablet  Commonly known as:  LEVAQUIN  Take 1 tablet (250 mg total) by mouth every other day.  Start taking on:  05/16/2015     nitroGLYCERIN 0.4 MG SL tablet  Commonly known as:  NITROSTAT  Place 1 tablet (0.4 mg total) under the tongue every 5 (five) minutes x 3 doses as needed for chest pain.     omeprazole 20 MG capsule  Commonly known as:  PRILOSEC  Take 20 mg by mouth daily.     polyethylene glycol packet  Commonly known as:  MIRALAX / GLYCOLAX  Take 17 g by mouth daily as needed for mild constipation.     predniSONE 10 MG tablet  Commonly known as:  DELTASONE  Take 1-4 tablets (10-40 mg total) by mouth daily at 3 pm. Start with 4 tabs q am, then decrease by 1 tab daily (3>>2>>1) until rx completed.     ranitidine 150 MG tablet  Commonly known as:  ZANTAC  Take 150 mg by mouth 2 (two) times daily.        Discharge Instructions    (HEART FAILURE PATIENTS) Call MD:  Anytime you have any of the following symptoms: 1) 3 pound weight gain in 24 hours or 5 pounds in 1 week 2) shortness of breath, with or without a dry hacking cough 3) swelling in the hands, feet or stomach 4) if you have to sleep on extra pillows at night in order to breathe.    Complete by:  As directed      Diet - low sodium heart healthy    Complete by:  As directed      Diet Carb Modified    Complete by:  As directed       Increase activity slowly    Complete by:  As directed  Follow-up Information    Follow up with [facility].   Why:  HH-RN/PT arranged   Contact information:   [address] [place] [address] [phone]       Follow up with Richardson Dopp, PA-C On 05/19/2015.   Specialties:  Physician Assistant, Radiology, Interventional Cardiology   Why:  See Cardiology provider at 11:30 AM, please arrive 15 minutes early for paperwork. Then follow up with Dr Percival Spanish.   Contact information:   [address] [address] [phone]       Schedule an appointment as soon as possible for a visit with Wardell Honour, MD.   Specialty:  [facility]   Contact information:   [place] [address] [phone]       BRING ALL MEDICATIONS WITH YOU TO FOLLOW UP APPOINTMENTS  Time spent with patient to include physician time: 55 min Signed: Rosaria Ferries, PA-C 05/14/2015, 3:43 PM Co-Sign MD

## 2015-05-14 NOTE — Progress Notes (Signed)
Patient ordered for discharge per MD. Reviewed discharge instructions, follow up appts, and medications with patient and daughter at bedside. Per case mgr, Patient and family informed that [employer] does not accept medicare for CNA services. If they would like CNA services they can initiate themselves or go through [facility] for services. IV d/c'd, tele off CCMD notified. Pt taken out to car via wheelchair with belongings.

## 2015-05-14 NOTE — Consult Note (Signed)
   [facility] CM Inpatient Consult   05/14/2015  Azara Gemme Swing [birth year] [phone] Referral received for community support and follow up. Patient evaluated for community based chronic disease management services with [place] [facility] as a benefit of patient's [facility] Medicare Insurance. Spoke with patient and daughter, Loletha Carrow, at bedside to explain [place] Management services. Consent form signed. . Patient will receive post discharge transition of care call and will be evaluated for monthly home visits for assessments and disease process education.  Left contact information and [facility] literature at bedside. Made Inpatient Case Manager aware that [place] Management following. Of note, [facility] Care Management services does not replace or interfere with any services that are arranged by inpatient case management or social work.  For additional questions or referrals please contact:   Natividad Brood, RN BSN [facility] Liaison  [phone] business mobile phone

## 2015-05-14 NOTE — Discharge Instructions (Signed)
Home health RN to do a BMET on Monday, 05/18/2015  Furosemide (Lasix) will be restarted on Monday 06/20 at 40 mg daily.

## 2015-05-14 NOTE — Care Management Note (Signed)
Case Management Note Initial CM note started by: Girard Cooter, RN 05/13/2015, 12:34 PM  Patient Details  Name: [name] MRN: [id number] Date of Birth: [birth year]  Subjective/Objective:      Pt lives alone, dtrs live nearby and check on her every day.  Pt will need home health PT and RN and agrees to services.  Provided list of home health agencies - pt requests services from [facility] [employer], her dtr's employer.  After talking with the owner of that agency, explained to her that the agency is not medicare certified and cannot provide skilled intermittent visits.  Referral then made to [facility] per choice.              Expected Discharge Date:  05/14/15               Expected Discharge Plan:  [place]  In-House Referral:     Discharge planning Services  CM Consult  Post Acute Care Choice:  Home Health Choice offered to:  Patient  DME Arranged:    DME Agency:     HH Arranged:  RN, PT HH Agency:  [facility]  Status of Service:  Completed, signed off  Medicare Important Message Given:  Yes Date Medicare IM Given:  05/13/15 Medicare IM give by:  Babette Relic RN Date Additional Medicare IM Given:    Additional Medicare Important Message give by:     If discussed at [place] of Stay Meetings, dates discussed:    Additional Comments:  05/14/15- received message that pt may discharge today and that pt would like [facility] services with [facility] [place] in to speak with pt regarding [facility] choice- and verified with pt that she wanted to use [facility] for [facility] services and not [facility]- call made to [facility] with Arville Go who states that they can accept the referral with services to start on Monday- pt reports that she is ok with start of services on Monday. [facility] also to see pt regarding possible referral for community based program. CM notified [facility] that pt no longer wanted them for services.   Shelby Gibbons [place], RN-- [phone] 05/14/2015, 3:17  PM

## 2015-05-14 NOTE — Progress Notes (Signed)
Patient Name: [name] Date of Encounter: 05/14/2015  Principal Problem:   Unstable angina Active Problems:   DIASTOLIC HEART FAILURE, CHRONIC   DM (diabetes mellitus)   CKD (chronic kidney disease) stage 4, GFR 15-29 ml/min   HTN (hypertension)   Coronary atherosclerosis of native coronary artery: stress test 2013 showing inferior and distal anteroseptal ischemia   Anemia in chronic kidney disease   Abnormal urinalysis   Acute bronchitis due to other specified organisms   Urinary tract infectious disease   Acute diastolic CHF (congestive heart failure)   Pulmonary hypertension   Diabetes type 2, uncontrolled   Chronic kidney disease, stage IV (severe)   Other constipation   Failure to thrive in adult   Hyperkalemia   Hypomagnesemia   Primary Cardiologist: Dr Percival Spanish  Patient Profile: 81 yo female w/ hx presumed CAD (abnl stress 2013), med rx 2nd CKD IV, D-CHF, CVA, admitted 06/11 w/ CP, palps. Ez minimal elevation, has UTI and URI sx.  SUBJECTIVE: SOB, cough have improved. Chest pain intermittent, not exertional. Feels she is okay to go home today. Has family members that can stay with her temporarily.  OBJECTIVE Filed Vitals:   05/13/15 1957 05/13/15 2223 05/14/15 0531 05/14/15 0534  BP: 158/42 138/53 162/54 156/62  Pulse: 88 67 70   Temp: 97.8 F (36.6 C)  98.1 F (36.7 C)   TempSrc: Oral  Oral   Resp: 16  17   Height:      Weight:      SpO2: 100%  94%     Intake/Output Summary (Last 24 hours) at 05/14/15 1208 Last data filed at 05/14/15 1148  Gross per 24 hour  Intake    560 ml  Output   1250 ml  Net   -690 ml   Filed Weights   05/09/15 2059 05/10/15 0100  Weight: 155 lb (70.308 kg) 155 lb 6.8 oz (70.5 kg)    PHYSICAL EXAM General: Well developed, well nourished, female in no acute distress. Head: Normocephalic, atraumatic.  Neck: Supple without bruits, JVD minimal elevation Lungs:  Resp regular and unlabored, Rales bases, no wheezing,  good air exchange. Heart: RRR, S1, S2, no S3, S4, 2/6 murmur; no rub. Abdomen: Soft, non-tender, non-distended, BS + x 4.  Extremities: No clubbing, cyanosis, 1+ bilateral lower extremity edema.  Neuro: Alert and oriented X 3. Moves all extremities spontaneously. Psych: Normal affect.  LABS: CBC: Recent Labs  05/11/15 2240 05/12/15 0325  WBC 5.4 5.3  HGB 9.1* 9.1*  HCT 27.4* 27.2*  MCV 89.3 88.9  PLT 138* 623   Basic Metabolic Panel: Recent Labs  05/12/15 1126  05/13/15 0214 05/14/15 0506  NA  --   < > 132* 135  K 4.7  < > 4.7 4.9  CL  --   < > 100* 102  CO2  --   < > 22 21*  GLUCOSE  --   < > 265* 249*  BUN  --   < > 77* 98*  CREATININE  --   < > 3.01* 3.17*  CALCIUM  --   < > 9.4 9.6  MG 2.0  --   --   --   < > = values in this interval not displayed.  BNP:  B NATRIURETIC PEPTIDE  Date/Time Value Ref Range Status  05/09/2015 09:12 PM 842.0* 0.0 - 100.0 pg/mL Final    TELE:  SR, PVCs, bigeminy at times, no clear sx associated with this.   Current  Medications:  . amLODipine  10 mg Oral Daily  . aspirin EC  81 mg Oral Daily  . budesonide (PULMICORT) nebulizer solution  0.25 mg Nebulization BID  . cloNIDine  0.2 mg Oral BID  . dextromethorphan-guaiFENesin  1 tablet Oral BID  . diphenhydrAMINE  25 mg Oral Once  . enoxaparin (LOVENOX) injection  30 mg Subcutaneous Q24H  . famotidine  10 mg Oral Daily  . fenofibrate  54 mg Oral Daily  . furosemide  80 mg Oral Daily  . insulin aspart  0-15 Units Subcutaneous TID WC  . insulin aspart protamine- aspart  16 Units Subcutaneous BID WC  . ipratropium-albuterol  3 mL Inhalation Q6H  . isosorbide mononitrate  120 mg Oral Daily  . labetalol  200 mg Oral BID  . lactose free nutrition  237 mL Oral TID WC  . [START ON 05/16/2015] levofloxacin  250 mg Oral Q48H  . pantoprazole  40 mg Oral Daily  . polyethylene glycol  17 g Oral BID  . predniSONE  40 mg Oral Q1500  . sodium polystyrene  15 g Oral Once      ASSESSMENT  AND PLAN: Principal Problem:   Unstable angina Active Problems:   DIASTOLIC HEART FAILURE, CHRONIC   DM (diabetes mellitus)   CKD (chronic kidney disease) stage 4, GFR 15-29 ml/min   HTN (hypertension)   Coronary atherosclerosis of native coronary artery: stress test 2013 showing inferior and distal anteroseptal ischemia   Anemia in chronic kidney disease   Abnormal urinalysis   Acute bronchitis due to other specified organisms   Urinary tract infectious disease   Acute diastolic CHF (congestive heart failure)   Pulmonary hypertension   Diabetes type 2, uncontrolled   Chronic kidney disease, stage IV (severe)   Other constipation   Failure to thrive in adult   Hyperkalemia   Hypomagnesemia  Plan:  Admitted with [place] and EKG changes. Enz neg. EF nl by 2D. Has had recurrent CP, but it is atypical and not exertional. On empiric ATBX for bronchitis, Nebs and steroids per TRH. ABX changed to oral formulation, steroid taper initiated. Lungs sound better. SCr slightly higher, will need to be followed closely. Lasix adjusted. Ambulated around Unit yesterday. HHRN and PT. No plans for cath at this time. OK to d/c home today, f/u next week in office w/ BMET by home health RN on Monday.   **BUN now 98, was 53 on admission. She had Lasix 20 mg IV on 06/12, and 2 doses of Lasix 40 mg IV on 6/13 with   an additional 40 mg given orally. Since then, she has been on Lasix 80 mg daily. M.D. advise on discharge diuretic dosing.  Potassium is also trending up, she required one dose of Kayexalate this admission for K +5.6. No meds obviously contributing to this and no potassium supplements ordered.  Signed, Rosaria Ferries , PA-C 12:08 PM 05/14/2015  Agree with note by Rosaria Ferries PA-C  OK for DC on Lasix 40 mg q Day as well as Prednisone taper, Levoquin QOD and Nebs. BUN is elevated. Will hold lasix next 2 days and start back. Re check BMET early next week then OV with MLP. No CP. Exam  benign.   Lorretta Harp, M.D., [place], [facility], Laverta [place] [address] [place], [place]  [address]  [phone] 05/14/2015 3:23 PM

## 2015-05-14 NOTE — Progress Notes (Signed)
Inpatient Diabetes Program Recommendations  AACE/ADA: New Consensus Statement on Inpatient Glycemic Control (2013)  Target Ranges:  Prepandial:   less than 140 mg/dL      Peak postprandial:   less than 180 mg/dL (1-2 hours)      Critically ill patients:  140 - 180 mg/dL   Reason for Visit: Hyperglycemia  Results for [name], [name] (MRN [id number]) as of 05/14/2015 10:50  Ref. Range 05/13/2015 11:42 05/13/2015 16:16 05/13/2015 17:24 05/13/2015 20:54 05/14/2015 06:07  Glucose-Capillary Latest Ref Range: 65-99 mg/dL 343 (H) 273 (H) 292 (H) 288 (H) 258 (H)   Blood sugars >250 mg/dL.  Recommendations: Increase 70/30 to 20 units bid.  Will continue to follow. Thank you. Lorenda Peck, RD, LDN, CDE Inpatient Diabetes Coordinator [phone]

## 2015-05-14 NOTE — Progress Notes (Addendum)
[facility] TEAM 1 - Stepdown/ICU TEAM CONSULT F/U Note  [name] DQQ:[id number] DOB: [birth year] DOA: 05/09/2015 PCP: Wardell Honour, MD  Admit HPI / Brief Narrative: 81 year old F Hx CAD (stress test 2013 demonstrated inferior and distal anteroseptal ischemia > managed conservatively due to renal insufficiency), CVA, DM2, chronic diastolic CHF, pulmonary hypertension who was admitted to the Cardiology service 6/11 with c/o CP similar to previous angina. Her daughter drove her to [facility] ED and she became pain free after Morphine, NTG IV, and ASA. Her EKG showed significant inferior and lateral ST depression.  TRH was asked to see her in consultation to address her multiple chronic medical issues.  HPI/Subjective: Patient states that she is breathing well and she is able to ambulate without any dyspnea but she has a cough and is not bringing up much mucus. She tells me that she does not have chronic problems with dyspnea or cough and wheeze symptoms started recently.  Assessment/Plan:  Unstable angina - Coronary atherosclerosis of native coronary artery Management per cardiology   Acute bronchitis The patient is a lifelong nonsmoker -worked 24+ years in a [employer] with [employer] quality [employer] - no evidence of focal pneumonia - does no have chronic issues with dyspnea and is not on chronic inhalers - Was started on methylprednisolone on 6/13-transitioned to oral prednisone on 6/15 at 40 mg daily -Recommend that she be tapered by 10 mg daily until prednisone finished -I have added Levaquin today to treat for acute bacterial bronchitis-based on renal function she will need it every other day for a total of 3 doses -I have also added a Combivent inhaler for her to use until her symptoms resolve-it is to be started today and she can take the inhaler that she receives in the Hospital home with her -Recommend Mucinex 600 mg twice a day to be prescribed for her for her cough  Klebsiella  pneumoniae UTI  Proven to be susceptible to ceftriaxone she has been receiving-have transitioned to Levaquin today will also treat above bronchitis and to which it is also sensitive- plan to complete 7 day treatment course- she will need 3 more doses of Levaquin every other day to complete this course  Chronic diastolic congestive heart failure Management per cardiology  Pulmonary hypertension Management per cardiology  Diabetes type II uncontrolled - hypoglycemia and hyperglycemia Hypoglycemia resolved but patient's CBGs are now very poorly controlled - resume 70/30 insulin but at lower dose than home regimen -Can return to home regimen once she is discharged  HTN Blood pressure elevated beyond goal - medical therapy adjusted - follow trend  CKD stage IV Cr baseline 2 to 3 - creatinine at upper end of usual range - follow with ongoing diuresis  Anemia of chronic kidney disease Status post 1 unit PRBC 6/14 - Hgb at goal (8.0 or >)  Constipation Continue miralax   Hyperkalemia Stable status post Kayexalate   Hypomagnesemia Magnesium at goal of 2.0   Deconditioning  PT/OT at discretion of primary service  Code Status: FULL Family Communication: no family present at time of exam  Antibiotics: Azithromycin 6/14 > 6/15 Ceftriaxone 6/14 >  DVT prophylaxis: Lovenox  Objective: Blood pressure 156/62, pulse 70, temperature 98.1 F (36.7 C), temperature source Oral, resp. rate 17, height 5\' 4"  (1.626 m), weight 70.5 kg (155 lb 6.8 oz), SpO2 94 %.  Intake/Output Summary (Last 24 hours) at 05/14/15 0924 Last data filed at 05/14/15 0800  Gross per 24 hour  Intake  800 ml  Output   1050 ml  Net   -250 ml   Exam: General:  respirations are comfortable Lungs: Coarse diffuse upper airway sounds transmitted throughout with diffuse mild expiratory wheeze without focal crackles  Cardiovascular: Regular rate and rhythm without murmur gallop or rub normal S1 and S2 Abdomen:  Nontender, nondistended, soft, bowel sounds positive, no rebound, no ascites, no appreciable mass Extremities: No significant cyanosis, clubbing;  trace edema bilateral lower extremities  Data Reviewed: Basic Metabolic Panel:  Recent Labs Lab 05/10/15 0335 05/12/15 0325 05/12/15 1126 05/12/15 2025 05/13/15 0214 05/14/15 0506  NA 137 134*  --  129* 132* 135  K 4.7 5.6* 4.7 4.6 4.7 4.9  CL 107 105  --  99* 100* 102  CO2 22 21*  --  19* 22 21*  GLUCOSE 234* 393*  --  414* 265* 249*  BUN 44* 64*  --  76* 77* 98*  CREATININE 2.42* 2.89*  --  3.06* 3.01* 3.17*  CALCIUM 9.7 9.3  --  9.4 9.4 9.6  MG  --   --  2.0  --   --   --     CBC:  Recent Labs Lab 05/09/15 2112 05/09/15 2144 05/11/15 0250 05/11/15 0740 05/11/15 2240 05/12/15 0325  WBC 8.2  --  3.9* 4.3 5.4 5.3  HGB 9.9* 9.2* 7.0* 7.4* 9.1* 9.1*  HCT 29.9* 27.0* 21.1* 22.5* 27.4* 27.2*  MCV 92.3  --  91.7 91.1 89.3 88.9  PLT 188  --  140* 141* 138* 176    Liver Function Tests: No results for input(s): AST, ALT, ALKPHOS, BILITOT, PROT, ALBUMIN in the last 168 hours. No results for input(s): LIPASE, AMYLASE in the last 168 hours. No results for input(s): AMMONIA in the last 168 hours.  Coags:  Recent Labs Lab 05/09/15 2112  INR 1.06   Cardiac Enzymes:  Recent Labs Lab 05/09/15 2112 05/10/15 0335 05/10/15 0958 05/10/15 1450  TROPONINI 0.03 0.08* 0.08* 0.07*    CBG:  Recent Labs Lab 05/13/15 1142 05/13/15 1616 05/13/15 1724 05/13/15 2054 05/14/15 0607  GLUCAP 343* 273* 292* 288* 258*    Recent Results (from the past 240 hour(s))  MRSA PCR Screening     Status: None   Collection Time: 05/10/15  1:01 AM  Result Value Ref Range Status   MRSA by PCR NEGATIVE NEGATIVE Final    Comment:        The GeneXpert MRSA Assay (FDA approved for NASAL specimens only), is one component of a comprehensive MRSA colonization surveillance program. It is not intended to diagnose MRSA infection nor to guide  or monitor treatment for MRSA infections.   Culture, Urine     Status: None   Collection Time: 05/10/15 12:45 PM  Result Value Ref Range Status   Specimen Description URINE, RANDOM  Final   Special Requests NONE  Final   Colony Count   Final    >=100,000 COLONIES/ML Performed at [facility]   Final    KLEBSIELLA PNEUMONIAE Performed at [facility]    Report Status 05/13/2015 FINAL  Final   Organism ID, Bacteria KLEBSIELLA PNEUMONIAE  Final      Susceptibility   Klebsiella pneumoniae - MIC*    AMPICILLIN RESISTANT      CEFAZOLIN <=4 SENSITIVE Sensitive     CEFTRIAXONE <=1 SENSITIVE Sensitive     CIPROFLOXACIN <=0.25 SENSITIVE Sensitive     GENTAMICIN <=1 SENSITIVE Sensitive     LEVOFLOXACIN <=0.12  SENSITIVE Sensitive     NITROFURANTOIN <=16 SENSITIVE Sensitive     TOBRAMYCIN <=1 SENSITIVE Sensitive     TRIMETH/SULFA <=20 SENSITIVE Sensitive     PIP/TAZO <=4 SENSITIVE Sensitive     * KLEBSIELLA PNEUMONIAE  Culture, blood (routine x 2)     Status: None (Preliminary result)   Collection Time: 05/12/15  7:05 PM  Result Value Ref Range Status   Specimen Description BLOOD LEFT ANTECUBITAL  Final   Special Requests BOTTLES DRAWN AEROBIC AND ANAEROBIC 10CC EA  Final   Culture   Final           BLOOD CULTURE RECEIVED NO GROWTH TO DATE CULTURE WILL BE HELD FOR 5 DAYS BEFORE ISSUING A FINAL NEGATIVE REPORT Performed at [facility]    Report Status PENDING  Incomplete  Culture, blood (routine x 2)     Status: None (Preliminary result)   Collection Time: 05/12/15  7:20 PM  Result Value Ref Range Status   Specimen Description BLOOD RIGHT HAND  Final   Special Requests BOTTLES DRAWN AEROBIC ONLY 5CC  Final   Culture   Final           BLOOD CULTURE RECEIVED NO GROWTH TO DATE CULTURE WILL BE HELD FOR 5 DAYS BEFORE ISSUING A FINAL NEGATIVE REPORT Performed at [facility]    Report Status PENDING  Incomplete     Studies:   Recent  x-ray studies have been reviewed in detail by the Attending Physician  Scheduled Meds:  Scheduled Meds: . amLODipine  10 mg Oral Daily  . aspirin EC  81 mg Oral Daily  . budesonide (PULMICORT) nebulizer solution  0.25 mg Nebulization BID  . cloNIDine  0.2 mg Oral BID  . dextromethorphan-guaiFENesin  1 tablet Oral BID  . diphenhydrAMINE  25 mg Oral Once  . enoxaparin (LOVENOX) injection  30 mg Subcutaneous Q24H  . famotidine  10 mg Oral Daily  . fenofibrate  54 mg Oral Daily  . furosemide  80 mg Oral Daily  . insulin aspart  0-15 Units Subcutaneous TID WC  . insulin aspart protamine- aspart  16 Units Subcutaneous BID WC  . ipratropium-albuterol  3 mL Inhalation Q6H  . isosorbide mononitrate  120 mg Oral Daily  . labetalol  200 mg Oral BID  . lactose free nutrition  237 mL Oral TID WC  . [START ON 05/16/2015] levofloxacin  250 mg Oral Q48H  . levofloxacin  500 mg Oral Once  . pantoprazole  40 mg Oral Daily  . polyethylene glycol  17 g Oral BID  . predniSONE  40 mg Oral Q1500  . sodium polystyrene  15 g Oral Once    Time spent on care of this patient: 29 mins   Debbe Odea , MD   [facility] Office  [phone] Pager - Text Page per Shea Evans as per below:  On-Call/Text Page:      [email or web address]      password [email or web address]  If 7PM-7AM, please contact night-coverage [email or web address] Password [email or web address] 05/14/2015, 9:24 AM   LOS: 5 days

## 2015-05-14 NOTE — Telephone Encounter (Signed)
New message      TCM appt on 05-19-15 with Nicki Reaper

## 2015-05-15 LAB — TYPE AND SCREEN
ABO/RH(D): A POS
Antibody Screen: POSITIVE
DAT, IgG: NEGATIVE
UNIT DIVISION: 0
Unit division: 0

## 2015-05-15 NOTE — Telephone Encounter (Signed)
Patient contacted regarding discharge from [facility] on 05/14/15. Patient understands to follow up with provider scott weaver pa on 05/19/15 at 11:30 am [address]. Patient understands discharge instructions? yes Patient understands medications and regiment? yes Patient understands to bring all medications to this visit? yes  Patient has not picked up her medication yet. She c/o cont SOB and cough. Encouraged pt to get medication as soon as possible. She was educated regarding salt/sodium intake, fluid intake and keeping legs elevated when sitting. As spoke with patients dtr regarding above. They will call the office if they have any problems.

## 2015-05-15 NOTE — Telephone Encounter (Signed)
Will forward to NL; Dr. [name] pt

## 2015-05-15 NOTE — Patient Outreach (Signed)
[facility]) Care Management  05/15/2015  [name] 1932-07-07 [id number]   Medical Call Center report received on 05/15/2015 and assigned to Deloria Lair, RN for patient outreach.  [name] L. Elbert Ewings [facility] Care Management Assistant [phone]

## 2015-05-18 ENCOUNTER — Other Ambulatory Visit: Payer: Self-pay | Admitting: *Deleted

## 2015-05-18 DIAGNOSIS — N390 Urinary tract infection, site not specified: Secondary | ICD-10-CM | POA: Diagnosis not present

## 2015-05-18 DIAGNOSIS — R609 Edema, unspecified: Secondary | ICD-10-CM | POA: Diagnosis not present

## 2015-05-18 DIAGNOSIS — I129 Hypertensive chronic kidney disease with stage 1 through stage 4 chronic kidney disease, or unspecified chronic kidney disease: Secondary | ICD-10-CM | POA: Diagnosis not present

## 2015-05-18 DIAGNOSIS — E785 Hyperlipidemia, unspecified: Secondary | ICD-10-CM | POA: Diagnosis not present

## 2015-05-18 DIAGNOSIS — Z794 Long term (current) use of insulin: Secondary | ICD-10-CM | POA: Diagnosis not present

## 2015-05-18 DIAGNOSIS — I200 Unstable angina: Secondary | ICD-10-CM | POA: Diagnosis not present

## 2015-05-18 DIAGNOSIS — N184 Chronic kidney disease, stage 4 (severe): Secondary | ICD-10-CM | POA: Diagnosis not present

## 2015-05-18 DIAGNOSIS — N179 Acute kidney failure, unspecified: Secondary | ICD-10-CM | POA: Diagnosis not present

## 2015-05-18 DIAGNOSIS — I679 Cerebrovascular disease, unspecified: Secondary | ICD-10-CM | POA: Diagnosis not present

## 2015-05-18 DIAGNOSIS — I5032 Chronic diastolic (congestive) heart failure: Secondary | ICD-10-CM | POA: Diagnosis not present

## 2015-05-18 DIAGNOSIS — E119 Type 2 diabetes mellitus without complications: Secondary | ICD-10-CM | POA: Diagnosis not present

## 2015-05-18 DIAGNOSIS — J208 Acute bronchitis due to other specified organisms: Secondary | ICD-10-CM | POA: Diagnosis not present

## 2015-05-18 DIAGNOSIS — I2510 Atherosclerotic heart disease of native coronary artery without angina pectoris: Secondary | ICD-10-CM | POA: Diagnosis not present

## 2015-05-18 NOTE — Patient Outreach (Signed)
Transition of care call to pt today, Monday 05/18/15. No answer and pt does not have an answering machine. I note that I received an inbox message to call this pt on Friday, which I did not see until today. I will keep trying to reach [name] through out the day. Also, I will check to see if perhaps she went to a family members home upon discharge.  I made a second outreach call and spoke to pt's daughter, [name]. She stated that the home health nurse is currently there and requested that I call back.

## 2015-05-19 ENCOUNTER — Telehealth: Payer: Self-pay | Admitting: *Deleted

## 2015-05-19 ENCOUNTER — Encounter: Payer: Medicare Other | Admitting: Physician Assistant

## 2015-05-19 DIAGNOSIS — I5032 Chronic diastolic (congestive) heart failure: Secondary | ICD-10-CM | POA: Diagnosis not present

## 2015-05-19 DIAGNOSIS — N390 Urinary tract infection, site not specified: Secondary | ICD-10-CM | POA: Diagnosis not present

## 2015-05-19 DIAGNOSIS — Z794 Long term (current) use of insulin: Secondary | ICD-10-CM | POA: Diagnosis not present

## 2015-05-19 DIAGNOSIS — I2510 Atherosclerotic heart disease of native coronary artery without angina pectoris: Secondary | ICD-10-CM | POA: Diagnosis not present

## 2015-05-19 DIAGNOSIS — N184 Chronic kidney disease, stage 4 (severe): Secondary | ICD-10-CM | POA: Diagnosis not present

## 2015-05-19 DIAGNOSIS — E119 Type 2 diabetes mellitus without complications: Secondary | ICD-10-CM | POA: Diagnosis not present

## 2015-05-19 DIAGNOSIS — J208 Acute bronchitis due to other specified organisms: Secondary | ICD-10-CM | POA: Diagnosis not present

## 2015-05-19 DIAGNOSIS — I679 Cerebrovascular disease, unspecified: Secondary | ICD-10-CM | POA: Diagnosis not present

## 2015-05-19 DIAGNOSIS — I129 Hypertensive chronic kidney disease with stage 1 through stage 4 chronic kidney disease, or unspecified chronic kidney disease: Secondary | ICD-10-CM | POA: Diagnosis not present

## 2015-05-19 DIAGNOSIS — I200 Unstable angina: Secondary | ICD-10-CM | POA: Diagnosis not present

## 2015-05-19 LAB — CULTURE, BLOOD (ROUTINE X 2)
Culture: NO GROWTH
Culture: NO GROWTH

## 2015-05-19 NOTE — Patient Outreach (Addendum)
I was able to speak to [name] today. She does NOT have a good understanding of her medical conditions, nor her medications. She has not picked up the [facility] because she said she is not going to pay $45 for that. I also spoke to her daughter [name] who states her mother needs something for when she feels SOB and anxious. She also said that she feels she may not be able to use an inhaler correctly. I told her about a SPACER that can be used with the inhaler to make it easier on those who have difficulty with the mechanics told her it would be beneficial if she could [name] to [name] appt tomorrow and reinforce this information. I will also send today's note to [name].  I am going to send [name] some EMMI programs for HF.  I will call her weekly over the next month while [facility] is involved. If she still needs some coaching after they discharge her I will be willing to follow up with home visits myself.  I am also going to call [name] and emphasize that [name] needs some good teaching on HF and her medications.  Deloria Lair [facility] [phone]

## 2015-05-20 ENCOUNTER — Telehealth: Payer: Self-pay | Admitting: Family Medicine

## 2015-05-20 ENCOUNTER — Ambulatory Visit: Payer: Medicare Other | Admitting: Family Medicine

## 2015-05-20 NOTE — Patient Outreach (Signed)
error 

## 2015-05-20 NOTE — Telephone Encounter (Signed)
Spoke with patient and advised that is she didn't come in to her appt today that Dr Sabra Heck would be out of the office until 06-04-15. Offered appt with another provider but patient stated that if she needed to go to the hospital she would.

## 2015-05-21 ENCOUNTER — Encounter: Payer: Self-pay | Admitting: Cardiology

## 2015-05-21 DIAGNOSIS — E119 Type 2 diabetes mellitus without complications: Secondary | ICD-10-CM | POA: Diagnosis not present

## 2015-05-21 DIAGNOSIS — J208 Acute bronchitis due to other specified organisms: Secondary | ICD-10-CM | POA: Diagnosis not present

## 2015-05-21 DIAGNOSIS — I5032 Chronic diastolic (congestive) heart failure: Secondary | ICD-10-CM | POA: Diagnosis not present

## 2015-05-21 DIAGNOSIS — I2510 Atherosclerotic heart disease of native coronary artery without angina pectoris: Secondary | ICD-10-CM | POA: Diagnosis not present

## 2015-05-21 DIAGNOSIS — I679 Cerebrovascular disease, unspecified: Secondary | ICD-10-CM | POA: Diagnosis not present

## 2015-05-21 DIAGNOSIS — N184 Chronic kidney disease, stage 4 (severe): Secondary | ICD-10-CM | POA: Diagnosis not present

## 2015-05-21 DIAGNOSIS — N390 Urinary tract infection, site not specified: Secondary | ICD-10-CM | POA: Diagnosis not present

## 2015-05-21 DIAGNOSIS — Z794 Long term (current) use of insulin: Secondary | ICD-10-CM | POA: Diagnosis not present

## 2015-05-21 DIAGNOSIS — I200 Unstable angina: Secondary | ICD-10-CM | POA: Diagnosis not present

## 2015-05-21 DIAGNOSIS — I129 Hypertensive chronic kidney disease with stage 1 through stage 4 chronic kidney disease, or unspecified chronic kidney disease: Secondary | ICD-10-CM | POA: Diagnosis not present

## 2015-05-21 NOTE — Patient Outreach (Signed)
Error

## 2015-05-22 DIAGNOSIS — Z794 Long term (current) use of insulin: Secondary | ICD-10-CM | POA: Diagnosis not present

## 2015-05-22 DIAGNOSIS — I2510 Atherosclerotic heart disease of native coronary artery without angina pectoris: Secondary | ICD-10-CM | POA: Diagnosis not present

## 2015-05-22 DIAGNOSIS — E119 Type 2 diabetes mellitus without complications: Secondary | ICD-10-CM | POA: Diagnosis not present

## 2015-05-22 DIAGNOSIS — I200 Unstable angina: Secondary | ICD-10-CM | POA: Diagnosis not present

## 2015-05-22 DIAGNOSIS — N390 Urinary tract infection, site not specified: Secondary | ICD-10-CM | POA: Diagnosis not present

## 2015-05-22 DIAGNOSIS — I129 Hypertensive chronic kidney disease with stage 1 through stage 4 chronic kidney disease, or unspecified chronic kidney disease: Secondary | ICD-10-CM | POA: Diagnosis not present

## 2015-05-22 DIAGNOSIS — J208 Acute bronchitis due to other specified organisms: Secondary | ICD-10-CM | POA: Diagnosis not present

## 2015-05-22 DIAGNOSIS — I679 Cerebrovascular disease, unspecified: Secondary | ICD-10-CM | POA: Diagnosis not present

## 2015-05-22 DIAGNOSIS — I5032 Chronic diastolic (congestive) heart failure: Secondary | ICD-10-CM | POA: Diagnosis not present

## 2015-05-22 DIAGNOSIS — N184 Chronic kidney disease, stage 4 (severe): Secondary | ICD-10-CM | POA: Diagnosis not present

## 2015-05-24 DIAGNOSIS — I129 Hypertensive chronic kidney disease with stage 1 through stage 4 chronic kidney disease, or unspecified chronic kidney disease: Secondary | ICD-10-CM | POA: Diagnosis not present

## 2015-05-24 DIAGNOSIS — E119 Type 2 diabetes mellitus without complications: Secondary | ICD-10-CM | POA: Diagnosis not present

## 2015-05-24 DIAGNOSIS — I5032 Chronic diastolic (congestive) heart failure: Secondary | ICD-10-CM | POA: Diagnosis not present

## 2015-05-24 DIAGNOSIS — N184 Chronic kidney disease, stage 4 (severe): Secondary | ICD-10-CM | POA: Diagnosis not present

## 2015-05-24 DIAGNOSIS — J208 Acute bronchitis due to other specified organisms: Secondary | ICD-10-CM | POA: Diagnosis not present

## 2015-05-24 DIAGNOSIS — I2510 Atherosclerotic heart disease of native coronary artery without angina pectoris: Secondary | ICD-10-CM | POA: Diagnosis not present

## 2015-05-24 DIAGNOSIS — I679 Cerebrovascular disease, unspecified: Secondary | ICD-10-CM | POA: Diagnosis not present

## 2015-05-24 DIAGNOSIS — Z794 Long term (current) use of insulin: Secondary | ICD-10-CM | POA: Diagnosis not present

## 2015-05-24 DIAGNOSIS — I200 Unstable angina: Secondary | ICD-10-CM | POA: Diagnosis not present

## 2015-05-24 DIAGNOSIS — N390 Urinary tract infection, site not specified: Secondary | ICD-10-CM | POA: Diagnosis not present

## 2015-05-25 ENCOUNTER — Telehealth: Payer: Self-pay | Admitting: *Deleted

## 2015-05-25 NOTE — Patient Outreach (Signed)
No answer for her second Transtition of care call. I was not able to leave a message. I note pt cancelled her Primary Care MD appt because she was too sick to come in. I will call her later today.

## 2015-05-26 DIAGNOSIS — J208 Acute bronchitis due to other specified organisms: Secondary | ICD-10-CM | POA: Diagnosis not present

## 2015-05-26 DIAGNOSIS — I2510 Atherosclerotic heart disease of native coronary artery without angina pectoris: Secondary | ICD-10-CM | POA: Diagnosis not present

## 2015-05-26 DIAGNOSIS — E119 Type 2 diabetes mellitus without complications: Secondary | ICD-10-CM | POA: Diagnosis not present

## 2015-05-26 DIAGNOSIS — N390 Urinary tract infection, site not specified: Secondary | ICD-10-CM | POA: Diagnosis not present

## 2015-05-26 DIAGNOSIS — I5032 Chronic diastolic (congestive) heart failure: Secondary | ICD-10-CM | POA: Diagnosis not present

## 2015-05-26 DIAGNOSIS — I200 Unstable angina: Secondary | ICD-10-CM | POA: Diagnosis not present

## 2015-05-26 DIAGNOSIS — I679 Cerebrovascular disease, unspecified: Secondary | ICD-10-CM | POA: Diagnosis not present

## 2015-05-26 DIAGNOSIS — I129 Hypertensive chronic kidney disease with stage 1 through stage 4 chronic kidney disease, or unspecified chronic kidney disease: Secondary | ICD-10-CM | POA: Diagnosis not present

## 2015-05-26 DIAGNOSIS — Z794 Long term (current) use of insulin: Secondary | ICD-10-CM | POA: Diagnosis not present

## 2015-05-26 DIAGNOSIS — N184 Chronic kidney disease, stage 4 (severe): Secondary | ICD-10-CM | POA: Diagnosis not present

## 2015-05-27 ENCOUNTER — Encounter: Payer: Medicare Other | Admitting: Cardiology

## 2015-05-28 ENCOUNTER — Encounter: Payer: Self-pay | Admitting: Cardiology

## 2015-05-29 ENCOUNTER — Telehealth: Payer: Self-pay | Admitting: *Deleted

## 2015-05-29 DIAGNOSIS — I129 Hypertensive chronic kidney disease with stage 1 through stage 4 chronic kidney disease, or unspecified chronic kidney disease: Secondary | ICD-10-CM | POA: Diagnosis not present

## 2015-05-29 DIAGNOSIS — N184 Chronic kidney disease, stage 4 (severe): Secondary | ICD-10-CM | POA: Diagnosis not present

## 2015-05-29 DIAGNOSIS — E119 Type 2 diabetes mellitus without complications: Secondary | ICD-10-CM | POA: Diagnosis not present

## 2015-05-29 DIAGNOSIS — I200 Unstable angina: Secondary | ICD-10-CM | POA: Diagnosis not present

## 2015-05-29 DIAGNOSIS — N390 Urinary tract infection, site not specified: Secondary | ICD-10-CM | POA: Diagnosis not present

## 2015-05-29 DIAGNOSIS — J208 Acute bronchitis due to other specified organisms: Secondary | ICD-10-CM | POA: Diagnosis not present

## 2015-05-29 DIAGNOSIS — I5032 Chronic diastolic (congestive) heart failure: Secondary | ICD-10-CM | POA: Diagnosis not present

## 2015-05-29 DIAGNOSIS — I2510 Atherosclerotic heart disease of native coronary artery without angina pectoris: Secondary | ICD-10-CM | POA: Diagnosis not present

## 2015-05-29 DIAGNOSIS — I679 Cerebrovascular disease, unspecified: Secondary | ICD-10-CM | POA: Diagnosis not present

## 2015-05-29 DIAGNOSIS — Z794 Long term (current) use of insulin: Secondary | ICD-10-CM | POA: Diagnosis not present

## 2015-05-29 NOTE — Patient Outreach (Signed)
I have tried to complete my 2nd Transition of care call but have been unsuccessul. Mrs. Stockley does not answer her phone. I have talked with 2 of her daughers. Vicky reports that her mother has cancelled 2 appts with her primary care MD since she was discharged. She is unaware of any nursing services engaging her mother. Silva Bandy reports, she is the only family member that her mother is speaking to now because she gets angry and tells her children to stay away. Silva Bandy says she thinks the home health nurse has been coming. I have also called [facility] twice and was told last week the nurse assigned would call me back. I did not hear from her. I called them again today and spoke with the nursing supervisor who said the nurse visited this week on Tuesday. I requested that she ask the nurse to call me so we could collaborate and ensure this pt does get to her MD and has education on basic HF self care. Silva Bandy also told me that her mother was requesting hotdogs.  I will continue to try to engage this pt.  Deloria Lair [facility] [phone]

## 2015-06-02 ENCOUNTER — Encounter: Payer: Self-pay | Admitting: *Deleted

## 2015-06-02 ENCOUNTER — Telehealth: Payer: Self-pay | Admitting: *Deleted

## 2015-06-02 NOTE — Patient Outreach (Signed)
I was able to reach pt today and have a lengthy conversation with her. She still has not seen her primary MD or cardiologist. She has cancelled 2 appts with primary. I have encouraged her to call after we hang up and make another appt with Dr. Sabra Heck and schedule one to see Dr. Percival Spanish in [name]. She can drive to [place] but doesn't feel comfortable driving to [place].  She has all her meds except she did not get the combivent ordered for her in the hosptial because of the cost.   She denies SOB, edema.  Her weight is pretty stable low 140's and she says she is weighing daily.  She says she feels the best she has in quite awhile. She is able to drive locally and she went to church this past Sunday.  I discussed with her basic heart failure self management: weighing daily, avoiding salt, taking her meds, staying active.  I encouraged her to call her MD if she felt like she is more SOB or if her weight goes up 3-5# or if she gets swelling.  I will call her another time next week.  I did talk with her [place], Mickel Baas, last week. I requested that she let me know when she is going to discharge pt and she said she would. I let her know my concerns about pt literacy and understanding her disease process and encouraged her to really concentrate on basic HF education.  Deloria Lair [facility] [phone]

## 2015-06-03 DIAGNOSIS — E119 Type 2 diabetes mellitus without complications: Secondary | ICD-10-CM | POA: Diagnosis not present

## 2015-06-03 DIAGNOSIS — I679 Cerebrovascular disease, unspecified: Secondary | ICD-10-CM | POA: Diagnosis not present

## 2015-06-03 DIAGNOSIS — I2510 Atherosclerotic heart disease of native coronary artery without angina pectoris: Secondary | ICD-10-CM | POA: Diagnosis not present

## 2015-06-03 DIAGNOSIS — J208 Acute bronchitis due to other specified organisms: Secondary | ICD-10-CM | POA: Diagnosis not present

## 2015-06-03 DIAGNOSIS — N390 Urinary tract infection, site not specified: Secondary | ICD-10-CM | POA: Diagnosis not present

## 2015-06-03 DIAGNOSIS — I129 Hypertensive chronic kidney disease with stage 1 through stage 4 chronic kidney disease, or unspecified chronic kidney disease: Secondary | ICD-10-CM | POA: Diagnosis not present

## 2015-06-03 DIAGNOSIS — I200 Unstable angina: Secondary | ICD-10-CM | POA: Diagnosis not present

## 2015-06-03 DIAGNOSIS — I5032 Chronic diastolic (congestive) heart failure: Secondary | ICD-10-CM | POA: Diagnosis not present

## 2015-06-03 DIAGNOSIS — Z794 Long term (current) use of insulin: Secondary | ICD-10-CM | POA: Diagnosis not present

## 2015-06-03 DIAGNOSIS — N184 Chronic kidney disease, stage 4 (severe): Secondary | ICD-10-CM | POA: Diagnosis not present

## 2015-06-05 DIAGNOSIS — N390 Urinary tract infection, site not specified: Secondary | ICD-10-CM | POA: Diagnosis not present

## 2015-06-05 DIAGNOSIS — N184 Chronic kidney disease, stage 4 (severe): Secondary | ICD-10-CM | POA: Diagnosis not present

## 2015-06-05 DIAGNOSIS — I5032 Chronic diastolic (congestive) heart failure: Secondary | ICD-10-CM | POA: Diagnosis not present

## 2015-06-05 DIAGNOSIS — I2510 Atherosclerotic heart disease of native coronary artery without angina pectoris: Secondary | ICD-10-CM | POA: Diagnosis not present

## 2015-06-05 DIAGNOSIS — I200 Unstable angina: Secondary | ICD-10-CM | POA: Diagnosis not present

## 2015-06-05 DIAGNOSIS — I129 Hypertensive chronic kidney disease with stage 1 through stage 4 chronic kidney disease, or unspecified chronic kidney disease: Secondary | ICD-10-CM | POA: Diagnosis not present

## 2015-06-05 DIAGNOSIS — Z794 Long term (current) use of insulin: Secondary | ICD-10-CM | POA: Diagnosis not present

## 2015-06-05 DIAGNOSIS — E119 Type 2 diabetes mellitus without complications: Secondary | ICD-10-CM | POA: Diagnosis not present

## 2015-06-05 DIAGNOSIS — I679 Cerebrovascular disease, unspecified: Secondary | ICD-10-CM | POA: Diagnosis not present

## 2015-06-05 DIAGNOSIS — J208 Acute bronchitis due to other specified organisms: Secondary | ICD-10-CM | POA: Diagnosis not present

## 2015-06-08 ENCOUNTER — Other Ambulatory Visit: Payer: Medicare Other | Admitting: *Deleted

## 2015-06-09 ENCOUNTER — Other Ambulatory Visit: Payer: Self-pay | Admitting: *Deleted

## 2015-06-09 DIAGNOSIS — I2510 Atherosclerotic heart disease of native coronary artery without angina pectoris: Secondary | ICD-10-CM | POA: Diagnosis not present

## 2015-06-09 DIAGNOSIS — N184 Chronic kidney disease, stage 4 (severe): Secondary | ICD-10-CM | POA: Diagnosis not present

## 2015-06-09 DIAGNOSIS — I5032 Chronic diastolic (congestive) heart failure: Secondary | ICD-10-CM | POA: Diagnosis not present

## 2015-06-09 DIAGNOSIS — N390 Urinary tract infection, site not specified: Secondary | ICD-10-CM | POA: Diagnosis not present

## 2015-06-09 DIAGNOSIS — J208 Acute bronchitis due to other specified organisms: Secondary | ICD-10-CM | POA: Diagnosis not present

## 2015-06-09 DIAGNOSIS — I679 Cerebrovascular disease, unspecified: Secondary | ICD-10-CM | POA: Diagnosis not present

## 2015-06-09 DIAGNOSIS — I200 Unstable angina: Secondary | ICD-10-CM | POA: Diagnosis not present

## 2015-06-09 DIAGNOSIS — I129 Hypertensive chronic kidney disease with stage 1 through stage 4 chronic kidney disease, or unspecified chronic kidney disease: Secondary | ICD-10-CM | POA: Diagnosis not present

## 2015-06-09 DIAGNOSIS — Z794 Long term (current) use of insulin: Secondary | ICD-10-CM | POA: Diagnosis not present

## 2015-06-09 DIAGNOSIS — E119 Type 2 diabetes mellitus without complications: Secondary | ICD-10-CM | POA: Diagnosis not present

## 2015-06-10 NOTE — Patient Outreach (Signed)
Pt doing well. She still hasn't seen Dr. Sabra Heck or Dr. Percival Spanish since she was in the hospital. I called and made appts for her. She will see Dr. Sabra Heck this Friday and Dr. Percival Spanish on Aug. 24th. I called pt back and gave her these dates and emphasized how important it is to see her doctors on a regular basis and especially after coming out of the hospital.  We reviewed the HF self management daily routine (weigh daily, take meds, low salt diet, activity) and we also talked about her diet and I emphasized what fast food have a lot of salt in them.  I will call her again next week to see how her MD appt went. I reminded her that she can call me if she has any questions or problems.  Deloria Lair [facility] [phone]

## 2015-06-11 ENCOUNTER — Other Ambulatory Visit: Payer: Self-pay | Admitting: Family Medicine

## 2015-06-11 ENCOUNTER — Other Ambulatory Visit: Payer: Self-pay | Admitting: Family

## 2015-06-11 DIAGNOSIS — Z794 Long term (current) use of insulin: Secondary | ICD-10-CM | POA: Diagnosis not present

## 2015-06-11 DIAGNOSIS — I5032 Chronic diastolic (congestive) heart failure: Secondary | ICD-10-CM | POA: Diagnosis not present

## 2015-06-11 DIAGNOSIS — I2510 Atherosclerotic heart disease of native coronary artery without angina pectoris: Secondary | ICD-10-CM | POA: Diagnosis not present

## 2015-06-11 DIAGNOSIS — N184 Chronic kidney disease, stage 4 (severe): Secondary | ICD-10-CM | POA: Diagnosis not present

## 2015-06-11 DIAGNOSIS — I200 Unstable angina: Secondary | ICD-10-CM | POA: Diagnosis not present

## 2015-06-11 DIAGNOSIS — N390 Urinary tract infection, site not specified: Secondary | ICD-10-CM | POA: Diagnosis not present

## 2015-06-11 DIAGNOSIS — J208 Acute bronchitis due to other specified organisms: Secondary | ICD-10-CM | POA: Diagnosis not present

## 2015-06-11 DIAGNOSIS — E119 Type 2 diabetes mellitus without complications: Secondary | ICD-10-CM | POA: Diagnosis not present

## 2015-06-11 DIAGNOSIS — I129 Hypertensive chronic kidney disease with stage 1 through stage 4 chronic kidney disease, or unspecified chronic kidney disease: Secondary | ICD-10-CM | POA: Diagnosis not present

## 2015-06-11 DIAGNOSIS — I679 Cerebrovascular disease, unspecified: Secondary | ICD-10-CM | POA: Diagnosis not present

## 2015-06-12 ENCOUNTER — Ambulatory Visit (INDEPENDENT_AMBULATORY_CARE_PROVIDER_SITE_OTHER): Payer: Medicare Other | Admitting: Family Medicine

## 2015-06-12 ENCOUNTER — Encounter: Payer: Self-pay | Admitting: Family Medicine

## 2015-06-12 VITALS — BP 91/44 | HR 61 | Temp 97.3°F | Ht 64.0 in | Wt 150.0 lb

## 2015-06-12 DIAGNOSIS — M170 Bilateral primary osteoarthritis of knee: Secondary | ICD-10-CM

## 2015-06-12 DIAGNOSIS — I5032 Chronic diastolic (congestive) heart failure: Secondary | ICD-10-CM | POA: Diagnosis not present

## 2015-06-12 DIAGNOSIS — N189 Chronic kidney disease, unspecified: Secondary | ICD-10-CM | POA: Diagnosis not present

## 2015-06-12 DIAGNOSIS — I10 Essential (primary) hypertension: Secondary | ICD-10-CM

## 2015-06-12 DIAGNOSIS — E1122 Type 2 diabetes mellitus with diabetic chronic kidney disease: Secondary | ICD-10-CM

## 2015-06-12 NOTE — Progress Notes (Signed)
   Subjective:    Patient ID: [name], female    DOB: [birth year], 81 y.o.   MRN: [id number]  HPI 81 year old female with lots of chronic problems including diabetes, congestive heart failure. She lives alone and has trouble remembering in taking her medicines. She has had home health nurse helping. At her last visit she was in a bigeminal rhythm and ended up being hospitalized for 1 week. Apparently they diuresed about 6 pounds of fluid per her history. Today she complains of pain in her knees and neck. She is status post left TKA are. She generally uses BenGay to rub on sore joints and reports some relief.    Review of Systems  Constitutional: Positive for activity change.  HENT: Negative.   Respiratory: Negative.   Cardiovascular: Negative.   Gastrointestinal: Positive for abdominal distention.  Neurological: Negative.   Psychiatric/Behavioral: Negative.        Objective:   Physical Exam  Constitutional: She appears well-developed and well-nourished.  Cardiovascular: Normal rate.   Pulmonary/Chest: Effort normal and breath sounds normal.  Musculoskeletal:  Right knee was injected with 80 mg of Depo-Medrol after usual prep with Betadine and alcohol. Injection went well hopefully this will help her knee pain. I felt this was preferable to giving her another medicine.          Assessment & Plan:   1. DIASTOLIC HEART FAILURE, CHRONIC Comes are stable at present there is no edema and there are no rales appreciated. She has an irregular heart rhythm but that is chronic  2. Essential hypertension Pressure is a little lower today than usual. Is really hard to know what medicines she has taken. I'm inclined to leave well enough alone. I did encourage her to ask her family to help organize her pills in an effort in an in hopes of taking them as prescribed  3. Type 2 diabetes mellitus with diabetic chronic kidney disease Continue with NPH twice a day. Last A1c one month ago  was 7.6 which I think is acceptable given her age and relative noncompliance  Wardell Honour MD  4. Osteoarthritis of both knees, unspecified ostRight knee injected as described in progress noteeoarthritis type

## 2015-06-16 ENCOUNTER — Other Ambulatory Visit: Payer: Self-pay | Admitting: *Deleted

## 2015-06-16 DIAGNOSIS — N184 Chronic kidney disease, stage 4 (severe): Secondary | ICD-10-CM | POA: Diagnosis not present

## 2015-06-16 DIAGNOSIS — I5032 Chronic diastolic (congestive) heart failure: Secondary | ICD-10-CM | POA: Diagnosis not present

## 2015-06-16 DIAGNOSIS — E119 Type 2 diabetes mellitus without complications: Secondary | ICD-10-CM | POA: Diagnosis not present

## 2015-06-16 DIAGNOSIS — Z794 Long term (current) use of insulin: Secondary | ICD-10-CM | POA: Diagnosis not present

## 2015-06-16 DIAGNOSIS — I679 Cerebrovascular disease, unspecified: Secondary | ICD-10-CM | POA: Diagnosis not present

## 2015-06-16 DIAGNOSIS — N390 Urinary tract infection, site not specified: Secondary | ICD-10-CM | POA: Diagnosis not present

## 2015-06-16 DIAGNOSIS — I200 Unstable angina: Secondary | ICD-10-CM | POA: Diagnosis not present

## 2015-06-16 DIAGNOSIS — I129 Hypertensive chronic kidney disease with stage 1 through stage 4 chronic kidney disease, or unspecified chronic kidney disease: Secondary | ICD-10-CM | POA: Diagnosis not present

## 2015-06-16 DIAGNOSIS — I2510 Atherosclerotic heart disease of native coronary artery without angina pectoris: Secondary | ICD-10-CM | POA: Diagnosis not present

## 2015-06-16 DIAGNOSIS — J208 Acute bronchitis due to other specified organisms: Secondary | ICD-10-CM | POA: Diagnosis not present

## 2015-06-16 MED ORDER — CLONIDINE HCL 0.1 MG PO TABS: 0.1000 mg | ORAL_TABLET | Freq: Every day | ORAL | Status: DC | PRN

## 2015-06-24 DIAGNOSIS — N390 Urinary tract infection, site not specified: Secondary | ICD-10-CM | POA: Diagnosis not present

## 2015-06-24 DIAGNOSIS — I2510 Atherosclerotic heart disease of native coronary artery without angina pectoris: Secondary | ICD-10-CM | POA: Diagnosis not present

## 2015-06-24 DIAGNOSIS — J208 Acute bronchitis due to other specified organisms: Secondary | ICD-10-CM | POA: Diagnosis not present

## 2015-06-24 DIAGNOSIS — I5032 Chronic diastolic (congestive) heart failure: Secondary | ICD-10-CM | POA: Diagnosis not present

## 2015-06-24 DIAGNOSIS — N184 Chronic kidney disease, stage 4 (severe): Secondary | ICD-10-CM | POA: Diagnosis not present

## 2015-06-24 DIAGNOSIS — I679 Cerebrovascular disease, unspecified: Secondary | ICD-10-CM | POA: Diagnosis not present

## 2015-06-24 DIAGNOSIS — I200 Unstable angina: Secondary | ICD-10-CM | POA: Diagnosis not present

## 2015-06-24 DIAGNOSIS — E119 Type 2 diabetes mellitus without complications: Secondary | ICD-10-CM | POA: Diagnosis not present

## 2015-06-24 DIAGNOSIS — I129 Hypertensive chronic kidney disease with stage 1 through stage 4 chronic kidney disease, or unspecified chronic kidney disease: Secondary | ICD-10-CM | POA: Diagnosis not present

## 2015-06-24 DIAGNOSIS — Z794 Long term (current) use of insulin: Secondary | ICD-10-CM | POA: Diagnosis not present

## 2015-06-28 ENCOUNTER — Ambulatory Visit (INDEPENDENT_AMBULATORY_CARE_PROVIDER_SITE_OTHER): Payer: Medicare Other | Admitting: Family Medicine

## 2015-06-28 DIAGNOSIS — I2510 Atherosclerotic heart disease of native coronary artery without angina pectoris: Secondary | ICD-10-CM

## 2015-06-28 DIAGNOSIS — I200 Unstable angina: Secondary | ICD-10-CM

## 2015-06-28 DIAGNOSIS — N184 Chronic kidney disease, stage 4 (severe): Secondary | ICD-10-CM

## 2015-06-28 DIAGNOSIS — I5032 Chronic diastolic (congestive) heart failure: Secondary | ICD-10-CM

## 2015-06-28 DIAGNOSIS — E119 Type 2 diabetes mellitus without complications: Secondary | ICD-10-CM

## 2015-06-30 ENCOUNTER — Other Ambulatory Visit: Payer: Medicare Other | Admitting: *Deleted

## 2015-06-30 NOTE — Patient Outreach (Signed)
Transition of care call. Pt saw her primary care MD last week. She says she has been feeling pretty good, except the last few days she feels like she has had a cold and is sniffly. She denies SOB or edema and she denies any edema. She reports she is still getting home health visits and the nurse should be coming tomorrow. She says she is thinking about calling [facility] to see if she can be seen about this cold. I encouraged her to do so and I also told her I would call her again tomorrow to check on her.

## 2015-07-01 DIAGNOSIS — I5032 Chronic diastolic (congestive) heart failure: Secondary | ICD-10-CM | POA: Diagnosis not present

## 2015-07-01 DIAGNOSIS — Z794 Long term (current) use of insulin: Secondary | ICD-10-CM | POA: Diagnosis not present

## 2015-07-01 DIAGNOSIS — J208 Acute bronchitis due to other specified organisms: Secondary | ICD-10-CM | POA: Diagnosis not present

## 2015-07-01 DIAGNOSIS — I200 Unstable angina: Secondary | ICD-10-CM | POA: Diagnosis not present

## 2015-07-01 DIAGNOSIS — N390 Urinary tract infection, site not specified: Secondary | ICD-10-CM | POA: Diagnosis not present

## 2015-07-01 DIAGNOSIS — I129 Hypertensive chronic kidney disease with stage 1 through stage 4 chronic kidney disease, or unspecified chronic kidney disease: Secondary | ICD-10-CM | POA: Diagnosis not present

## 2015-07-01 DIAGNOSIS — I2510 Atherosclerotic heart disease of native coronary artery without angina pectoris: Secondary | ICD-10-CM | POA: Diagnosis not present

## 2015-07-01 DIAGNOSIS — I679 Cerebrovascular disease, unspecified: Secondary | ICD-10-CM | POA: Diagnosis not present

## 2015-07-01 DIAGNOSIS — N184 Chronic kidney disease, stage 4 (severe): Secondary | ICD-10-CM | POA: Diagnosis not present

## 2015-07-01 DIAGNOSIS — E119 Type 2 diabetes mellitus without complications: Secondary | ICD-10-CM | POA: Diagnosis not present

## 2015-07-02 ENCOUNTER — Encounter: Payer: Self-pay | Admitting: Physician Assistant

## 2015-07-02 ENCOUNTER — Ambulatory Visit (INDEPENDENT_AMBULATORY_CARE_PROVIDER_SITE_OTHER): Payer: Medicare Other | Admitting: Physician Assistant

## 2015-07-02 ENCOUNTER — Other Ambulatory Visit: Payer: Self-pay | Admitting: *Deleted

## 2015-07-02 VITALS — BP 174/66 | HR 84 | Temp 97.3°F | Ht 64.0 in | Wt 139.0 lb

## 2015-07-02 DIAGNOSIS — N3090 Cystitis, unspecified without hematuria: Secondary | ICD-10-CM

## 2015-07-02 DIAGNOSIS — R531 Weakness: Secondary | ICD-10-CM | POA: Diagnosis not present

## 2015-07-02 DIAGNOSIS — R739 Hyperglycemia, unspecified: Secondary | ICD-10-CM

## 2015-07-02 DIAGNOSIS — E1122 Type 2 diabetes mellitus with diabetic chronic kidney disease: Secondary | ICD-10-CM | POA: Diagnosis not present

## 2015-07-02 DIAGNOSIS — S80811A Abrasion, right lower leg, initial encounter: Secondary | ICD-10-CM | POA: Diagnosis not present

## 2015-07-02 LAB — POCT UA - MICROSCOPIC ONLY
Casts, Ur, LPF, POC: NEGATIVE
Crystals, Ur, HPF, POC: NEGATIVE
Yeast, UA: NEGATIVE

## 2015-07-02 LAB — POCT URINALYSIS DIPSTICK
Bilirubin, UA: NEGATIVE
Blood, UA: NEGATIVE
Ketones, UA: NEGATIVE
LEUKOCYTES UA: NEGATIVE
NITRITE UA: NEGATIVE
PH UA: 5
Urobilinogen, UA: NEGATIVE

## 2015-07-02 LAB — GLUCOSE, POCT (MANUAL RESULT ENTRY): POC GLUCOSE: 298 mg/dL — AB (ref 70–99)

## 2015-07-02 MED ORDER — SULFAMETHOXAZOLE-TRIMETHOPRIM 800-160 MG PO TABS: 1.0000 | ORAL_TABLET | Freq: Two times a day (BID) | ORAL | Status: DC

## 2015-07-02 NOTE — Patient Outreach (Signed)
Transition of care call. Pt reports she is still receiving home health services. She says she has a cold and is thinking about going to the MD tomorrow. She says she has a cough and the sniffles. She denies SOB, edema or wt gain. She has all her meds and is taking them. She does not have any OTC cold medication at this time.  I encouraged her to call her MD and make the appt and I told her I would also call her tomorrow.  Deloria Lair [facility] [place] [phone]

## 2015-07-02 NOTE — Patient Instructions (Signed)
TAKE INSULIN AND MEDICATIONS AS DIRECTED EAT FREQUENTLY - DO NOT SKIP MEALS TAKE ALL OF ANTIBIOTIC AS DIRECTED WITH FOOD RETURN TO OFFICE 2 WEEKS

## 2015-07-02 NOTE — Progress Notes (Signed)
   Subjective:    Patient ID: [name], female    DOB: 1932/11/26, 81 y.o.   MRN: [id number]  HPI 81 y/o female with numerous comorbidities including uncontrolled insulin dependent DM, CRF, CHF presents with c/o lightheadedness that caused her to fall last night and this morning. She has not been taking her insulin as directed, has not ate since yesterday. FBS today is 298.     Review of Systems  Constitutional: Positive for appetite change (decreased appetitie ). Negative for fever, chills, diaphoresis and fatigue.  HENT: Negative.   Respiratory: Negative for shortness of breath.   Cardiovascular: Negative for chest pain, palpitations and leg swelling.  Gastrointestinal: Negative.   Endocrine: Negative.   Genitourinary: Negative for dysuria, urgency, frequency and hematuria.  Musculoskeletal: Negative.   Neurological: Positive for dizziness, weakness and light-headedness. Negative for syncope.       Objective:   Physical Exam  Constitutional: No distress.  Appears to be malnourished, fatigued   HENT:  Head: Normocephalic.  Cardiovascular: Normal rate.   Murmur (blowing systolic , moderate ) heard. Pulmonary/Chest: Effort normal and breath sounds normal. No respiratory distress. She has no wheezes. She has no rales. She exhibits no tenderness.  Abdominal: Soft.  Skin: She is not diaphoretic.  Abrasion on right posterior lower leg d/t fall this am   Psychiatric: She has a normal mood and affect. Her behavior is normal. Judgment and thought content normal.  Nursing note and vitals reviewed.         Assessment & Plan:  1. Weakness generalized  - POCT urinalysis dipstick - POCT UA - Microscopic Only - POCT glucose (manual entry)  2. Cystitis  - sulfamethoxazole-trimethoprim (BACTRIM DS,SEPTRA DS) 800-160 MG per tablet; Take 1 tablet by mouth 2 (two) times daily.  Dispense: 20 tablet; Refill: 0  3. Abrasion of right leg, initial encounter  -  sulfamethoxazole-trimethoprim (BACTRIM DS,SEPTRA DS) 800-160 MG per tablet; Take 1 tablet by mouth 2 (two) times daily.  Dispense: 20 tablet; Refill: 0  4. Hyperglycemia - stressed the importance of taking medications as directed and eating since this is likely causing her episodes of dizziness and lightheadedness, in addition to her UTI. I have    Continue all meds Labs pending Health Maintenance reviewed Diet and exercise encouraged RTO2 weeks for recheck   Makela Niehoff A. Benjamin Stain PA-C

## 2015-07-02 NOTE — Patient Outreach (Signed)
Telephone follow up to reported cold sx and fall last night.  Pt reports she fell again this am. No apparent injury. Not using her cane. Continues to have cold sxs. Denies SOB, edema or wt gain.  She has not called for an MD appt.  I volunteered to call and make an appt if she agreed and she did. I called and got an appt for her to be seen this afternoon at 2:25 pm. I called pt back and informed her and asked her to arrive at 2:15. She says she will.  I will call her again tomorrow for follow up.  Deloria Lair [facility] [place] [phone]

## 2015-07-03 ENCOUNTER — Other Ambulatory Visit: Payer: Self-pay | Admitting: *Deleted

## 2015-07-03 NOTE — Patient Outreach (Signed)
Telephone follow up acute sxs. Pt reports she did go to the MD appt I made for her yesterday. She was checked over well. The provider focused on encouraging the pt to take her meds and making sure she is eating regulary since she is taking insulin. Pt is currently still in bed and she says she is sore from the 2 falls. She did get an abrasion on her leg that was tending to at the MD office.  I am not feeling very confident about this patients self care habits and she agrees that I can follow her when home health discharges her.  I am going to reach out to the current nurse serving her to collaborate on a smooth transition of care. I called [facility] and talked with the nurse supervisor and she is going to contact the active nurse to contact me so that perhaps we can do a joint visit next week. I left her my phone number.  Deloria Lair [facility] [place] [phone]

## 2015-07-07 ENCOUNTER — Other Ambulatory Visit: Payer: Self-pay | Admitting: *Deleted

## 2015-07-08 NOTE — Patient Outreach (Signed)
Follow up acute illness: Pt reports she just learned that she has a UTI confirmed by culture and an antibiotic has been called in for her. She does report she feels somewhat better than during our last conversation last week. I have asked pt to take the antibiotic with food or after a meal and to make sure she takes all of the Rx.  She has not heard from her [place] this week. I told her I had called [place] and asked that when she is ready to make the last visit perhaps we could do a joint visit for continuity of care. I have not heard from her and will call again to request this. ( I have done this. I talked with Kendrick Fries and she is going to relay my message to the nurse.) The reason I want to do this is that from talking to the pt over the last weeks, I do not feel confident that the pt really understands her medical issues. I would like to reinforce the teaching that has already been given and follow her for a few months to do so.  Pt denies any falls since she went to the MD last week.  I will call her again in a week. I will try to see the pt at the time the current [place] is making her last visit.  Deloria Lair [facility] [phone]

## 2015-07-13 ENCOUNTER — Other Ambulatory Visit: Payer: Medicare Other | Admitting: *Deleted

## 2015-07-13 NOTE — Patient Outreach (Signed)
Telephone call to follow up on pt condition. Pt reports the home health nurse did not come last week. She says she is supposed to come on Thursday. She also has a follow up appt with her primary care office that morning. Pt  Reports she has not weighed today nor checked her glucose level at this time. She says she stayed in bed most of the weekend and was very vague about why she stayed in bed.  I want to make a visit with the home health nurse for a transition to [facility] services alone. I am scheduling myself for Thursday at 11:00 for my initial visit with her as I am not comfortable with the pts self care management at this time.  Deloria Lair [facility] [phone]

## 2015-07-14 DIAGNOSIS — E119 Type 2 diabetes mellitus without complications: Secondary | ICD-10-CM | POA: Diagnosis not present

## 2015-07-14 DIAGNOSIS — I5032 Chronic diastolic (congestive) heart failure: Secondary | ICD-10-CM | POA: Diagnosis not present

## 2015-07-14 DIAGNOSIS — J208 Acute bronchitis due to other specified organisms: Secondary | ICD-10-CM | POA: Diagnosis not present

## 2015-07-14 DIAGNOSIS — N184 Chronic kidney disease, stage 4 (severe): Secondary | ICD-10-CM | POA: Diagnosis not present

## 2015-07-14 DIAGNOSIS — I200 Unstable angina: Secondary | ICD-10-CM | POA: Diagnosis not present

## 2015-07-14 DIAGNOSIS — N390 Urinary tract infection, site not specified: Secondary | ICD-10-CM | POA: Diagnosis not present

## 2015-07-14 DIAGNOSIS — I679 Cerebrovascular disease, unspecified: Secondary | ICD-10-CM | POA: Diagnosis not present

## 2015-07-14 DIAGNOSIS — I2510 Atherosclerotic heart disease of native coronary artery without angina pectoris: Secondary | ICD-10-CM | POA: Diagnosis not present

## 2015-07-14 DIAGNOSIS — Z794 Long term (current) use of insulin: Secondary | ICD-10-CM | POA: Diagnosis not present

## 2015-07-14 DIAGNOSIS — I129 Hypertensive chronic kidney disease with stage 1 through stage 4 chronic kidney disease, or unspecified chronic kidney disease: Secondary | ICD-10-CM | POA: Diagnosis not present

## 2015-07-16 ENCOUNTER — Ambulatory Visit: Payer: Medicare Other | Admitting: Physician Assistant

## 2015-07-16 ENCOUNTER — Other Ambulatory Visit: Payer: Self-pay | Admitting: *Deleted

## 2015-07-16 NOTE — Patient Outreach (Signed)
[place] [facility]) Care Management   07/16/2015  [name] 1932/01/13 [id number]  [name] is an 81 y.o. female  Subjective:   Objective:   Review of Systems  Constitutional: Negative.   HENT: Negative.   Eyes: Negative.   Respiratory: Negative.   Cardiovascular: Positive for chest pain.       Reports occasional chest pain and she does take NTG and gets relief with one.  Gastrointestinal: Negative.   Genitourinary: Negative.   Musculoskeletal: Negative.   Skin:       Bruises from taking ASA.  Neurological: Negative.   Endo/Heme/Allergies: Bruises/bleeds easily.  Psychiatric/Behavioral: Negative.     Physical Exam  Constitutional: She appears well-developed and well-nourished.  HENT:  Head: Normocephalic.    Current Medications:   Current Outpatient Prescriptions  Medication Sig Dispense Refill  . amLODipine (NORVASC) 10 MG tablet TAKE ONE TABLET BY MOUTH ONE TIME DAILY 90 tablet 1  . aspirin EC 81 MG tablet Take 81 mg by mouth daily.    . cloNIDine (CATAPRES) 0.1 MG tablet Take 1 tablet (0.1 mg total) by mouth daily as needed. For BP over 150/100. 30 tablet 3  . glucose blood (ACCU-CHEK AVIVA PLUS) test strip Test Blood sugar tid. E11.9 100 each 5  . insulin NPH Human (HUMULIN N) 100 UNIT/ML injection Inject 0.2 mLs (20 Units total) into the skin 2 (two) times daily. 10 mL 11  . Ipratropium-Albuterol (COMBIVENT) 20-100 MCG/ACT AERS respimat Inhale 2 puffs into the lungs every 6 (six) hours as needed for wheezing. 1 Inhaler 0  . isosorbide mononitrate (IMDUR) 120 MG 24 hr tablet TAKE ONE TABLET BY MOUTH ONE TIME DAILY 30 tablet 3  . nitroGLYCERIN (NITROSTAT) 0.4 MG SL tablet Place 1 tablet (0.4 mg total) under the tongue every 5 (five) minutes x 3 doses as needed for chest pain. 30 tablet 11  . omeprazole (PRILOSEC) 20 MG capsule Take 20 mg by mouth daily.    . fenofibrate 54 MG tablet TAKE 1 TABLET DAILY (Patient not taking: Reported on 07/16/2015)  30 tablet 5  . furosemide (LASIX) 40 MG tablet Take 1 tablet (40 mg total) by mouth daily. 30 tablet 3  . labetalol (NORMODYNE) 200 MG tablet Take 200 mg by mouth 2 (two) times daily.    Marland Kitchen lactose free nutrition (BOOST PLUS) LIQD Take 237 mLs by mouth 3 (three) times daily with meals. (Patient not taking: Reported on 07/16/2015)  0  . sulfamethoxazole-trimethoprim (BACTRIM DS,SEPTRA DS) 800-160 MG per tablet Take 1 tablet by mouth 2 (two) times daily. (Patient not taking: Reported on 07/16/2015) 20 tablet 0   No current facility-administered medications for this visit.    Functional Status:   In your present state of health, do you have any difficulty performing the following activities: 06/02/2015 05/10/2015  Hearing? - N  Vision? - N  Difficulty concentrating or making decisions? - N  Walking or climbing stairs? - N  Dressing or bathing? - N  Doing errands, shopping? - N  Conservation officer, nature and eating ? N -  Using the Toilet? N -  In the past six months, have you accidently leaked urine? N -  Do you have problems with loss of bowel control? N -  Managing your Medications? N -  Managing your Finances? N -  Housekeeping or managing your Housekeeping? N -    Fall/Depression Screening:    [facility] 2/9 Scores 06/02/2015 05/08/2015 10/30/2014 09/30/2014 03/21/2014 03/21/2014 08/09/2013  PHQ - 2 Score  0 0 3 0 0 0 0  PHQ- 9 Score - - 10 - - - -    Assessment:  Noncompliance with routine medication dosing. Needs medication education.                         Reinforcement HF self management.  Plan:  Call MDs to verify medication regimen: Clonidine/Normadyne and Fenobibrate. Medication oversight.            HF education F/U in 2 weeks.

## 2015-07-17 ENCOUNTER — Encounter: Payer: Self-pay | Admitting: Family Medicine

## 2015-07-18 ENCOUNTER — Encounter: Payer: Self-pay | Admitting: *Deleted

## 2015-07-18 NOTE — Patient Outreach (Signed)
[name]) Care Management   07/18/2015  [name], [name] [id number]  [name] is an 81 y.o. female  Subjective: I am visiting pt in her home today, this is my first face to face visit. She has been having a home health nurse come out. I have been calling her weekly for several months. I wanted to get involved personally as I am not comfortable with the pts knowledge and her self disease management practices.  Pt states she feels fine today. She has weighed and taken her meds today. She denies falling since 2 weeks ago.  Objective:  BP 130/40 mmHg  Pulse 64  Resp 16  Ht 1.626 m (_0 )  Wt 138 lb (62.596 kg)  BMI 23.68 kg/m2  SpO2 98%   Pt has all meds except Clonidine and Normodyne. She has muliple bottles of some and some dates are 44 months old.  Review of Systems  Constitutional: Negative.   HENT: Negative.   Eyes: Negative.   Respiratory: Negative.   Cardiovascular: Negative.   Gastrointestinal: Negative.   Genitourinary: Negative.   Musculoskeletal: Positive for falls.  Skin: Negative.   Neurological: Negative.   Endo/Heme/Allergies: Bruises/bleeds easily.  Psychiatric/Behavioral: Negative.     Physical Exam  Constitutional: She is oriented to person, place, and time. She appears well-developed and well-nourished.  HENT:  Head: Normocephalic.  Neck: Normal range of motion.  Cardiovascular: Normal rate and regular rhythm.   Murmur heard. Respiratory: Effort normal and breath sounds normal.  GI: Soft. Bowel sounds are normal.  Musculoskeletal: Normal range of motion.  Neurological: She is alert and oriented to person, place, and time.  Skin: Skin is warm and dry.  Psychiatric: She has a normal mood and affect.    Current Medications:   Current Outpatient Prescriptions  Medication Sig Dispense Refill  . amLODipine (NORVASC) 10 MG tablet TAKE ONE TABLET BY MOUTH ONE TIME DAILY 90 tablet 1  . aspirin EC 81 MG tablet Take 81  mg by mouth daily.    . cloNIDine (CATAPRES) 0.1 MG tablet Take 1 tablet (0.1 mg total) by mouth daily as needed. For BP over 150/100. 30 tablet 3  . glucose blood (ACCU-CHEK AVIVA PLUS) test strip Test Blood sugar tid. E11.9 100 each 5  . insulin NPH Human (HUMULIN N) 100 UNIT/ML injection Inject 0.2 mLs (20 Units total) into the skin 2 (two) times daily. 10 mL 11  . Ipratropium-Albuterol (COMBIVENT) 20-100 MCG/ACT AERS respimat Inhale 2 puffs into the lungs every 6 (six) hours as needed for wheezing. 1 Inhaler 0  . isosorbide mononitrate (IMDUR) 120 MG 24 hr tablet TAKE ONE TABLET BY MOUTH ONE TIME DAILY 30 tablet 3  . nitroGLYCERIN (NITROSTAT) 0.4 MG SL tablet Place 1 tablet (0.4 mg total) under the tongue every 5 (five) minutes x 3 doses as needed for chest pain. 30 tablet 11  . omeprazole (PRILOSEC) 20 MG capsule Take 20 mg by mouth daily.    . fenofibrate 54 MG tablet TAKE 1 TABLET DAILY (Patient not taking: Reported on 07/16/2015) 30 tablet 5  . furosemide (LASIX) 40 MG tablet Take 1 tablet (40 mg total) by mouth daily. 30 tablet 3  . labetalol (NORMODYNE) 200 MG tablet Take 200 mg by mouth 2 (two) times daily.    Marland Kitchen lactose free nutrition (BOOST PLUS) LIQD Take 237 mLs by mouth 3 (three) times daily with meals. (Patient not taking: Reported on 07/16/2015)  0  . sulfamethoxazole-trimethoprim (BACTRIM DS,SEPTRA DS)  800-160 MG per tablet Take 1 tablet by mouth 2 (two) times daily. (Patient not taking: Reported on 07/16/2015) 20 tablet 0   No current facility-administered medications for this visit.    Functional Status:   In your present state of health, do you have any difficulty performing the following activities: 06/02/2015 6/[name]/2016  Hearing? - N  Vision? - N  Difficulty concentrating or making decisions? - N  Walking or climbing stairs? - N  Dressing or bathing? - N  Doing errands, shopping? - N  Conservation officer, nature and eating ? N -  Using the Toilet? N -  In the past six months, have you  accidently leaked urine? N -  Do you have problems with loss of bowel control? N -  Managing your Medications? N -  Managing your Finances? N -  Housekeeping or managing your Housekeeping? N -    Fall/Depression Screening:    PHQ 2/9 Scores 06/02/2015 05/08/2015 [name]/01/2014 09/30/2014 03/21/2014 03/21/2014 9/[name]/2014  PHQ - 2 Score 0 0 3 0 0 0 0  PHQ- 9 Score - - 10 - - - -   Fall Risk  06/02/2015 05/08/2015 12/02/2014 09/30/2014 03/21/2014  Falls in the past year? - Yes Yes Yes Yes  Number falls in past yr: 2 or more 2 or more 2 or more 2 or more -  Injury with Fall? No No - - -  Risk for fall due to : History of fall(s);Impaired balance/gait;Other (Comment) - Impaired balance/gait Impaired balance/gait Impaired balance/gait;Impaired mobility  Risk for fall due to (comments): - - - - -  Follow up Falls evaluation completed;Education provided;Falls prevention discussed Falls prevention discussed - - -    Assessment:  CHF, CKD, Diabetes, HTN  Plan: I will focus on CHF and adherence to her self management regimen.           I will call Dr. [name] about her Rx for Normodyne which she does not have.           She also has the Rx for clonidine which is out but this is only for BPs above 150.           I will see pt in 2 weeks.  [place] [name] CM Care Plan Problem One        Patient Outreach Telephone from 07/07/2015 in [place] Problem One  High risk for falls   Care Plan for Problem One  Active   [facility] Long Term Goal (31-90 days)  Pt will use her cane and exercise caution when walking and avoid falling over the next 90 days.   Interventions for Problem One Long Term Goal  Sent pt to MD last week for hx 2 falls. Have encouraged pt to use her cane. Get up slowly and stand for a minute before walking to ensure she she steady.    [name] CM Care Plan Problem Two        Patient Outreach from 07/16/2015 in [place] for Problem Two  Active   Interventions for Problem  Two Long Term Goal   Provided [facility] Calendar and HF folder education :went through the HF self managmant. Emphasized if problems call me or MD>   Interventions for Short Term Goal #2   Went through each med and explained what is is for and what each looks like.    THN CM Short Term Goal #2 (0-30 days)  Pt will take her meds daily  for next 2 weeks as evidenced by medication box.   THN CM Short Term Goal #2 Start Date  07/16/15   Interventions for Short Term Goal #2  Filled med box for pt for two weeks.    THN CM Care Plan Problem Three        Telephone from 06/02/2015 in [place] Problem Three  Pt has not seen an MD since her [name] discharge.   Care Plan for Problem Three  Active   THN CM Short Term Goal #1 (0-30 days)  Pt to schedule appts and see priamry and cardiologist.   [name] CM Short Term Goal #1 Start Date  06/02/15   [facility] CM Short Term Goal #1 Met Date  06/30/15   Interventions for Short Term Goal #1  Encouraged pt to call Dr. Sabra Heck and Dr. [name] offices and make her appts. I emphasized how important it is to see these doctors to make she she is recovering well and is not at risk for being readmitted. Pt states she will do so.     Deloria Lair [facility] [name] [phone]

## 2015-07-22 ENCOUNTER — Encounter: Payer: Self-pay | Admitting: Cardiology

## 2015-07-22 ENCOUNTER — Ambulatory Visit (INDEPENDENT_AMBULATORY_CARE_PROVIDER_SITE_OTHER): Payer: Medicare Other | Admitting: Cardiology

## 2015-07-22 VITALS — BP 182/74 | HR 64 | Ht 64.0 in | Wt 141.0 lb

## 2015-07-22 DIAGNOSIS — I2510 Atherosclerotic heart disease of native coronary artery without angina pectoris: Secondary | ICD-10-CM

## 2015-07-22 NOTE — Progress Notes (Signed)
HPI The patient presents for follow up of coronary artery disease and HTN.  She was hospitalized in May.  I reviewed these records.  She had chest pain but she was managed medically.  She had a mild increase in her troponin.  However, she has wanted conservative therapy.  She also had volume overload at that time.  She does have renal insufficiency.  Since she was discharged she is not having any of the chest pain that she was having.  She denies any acute SOB.  She is not having PND or orthopnea.  However, she feels fatigued and she wants to sleep all of the time.  She has had no edema.  She denies any palpitations, presyncope or syncope.   Allergies  Allergen Reactions  . Propoxyphene N-Acetaminophen     Pt does not know reaction  . Simvastatin Other (See Comments)    headache    Current Outpatient Prescriptions  Medication Sig Dispense Refill  . amLODipine (NORVASC) 10 MG tablet TAKE ONE TABLET BY MOUTH ONE TIME DAILY 90 tablet 1  . aspirin EC 81 MG tablet Take 81 mg by mouth daily.    . cloNIDine (CATAPRES) 0.1 MG tablet Take 1 tablet (0.1 mg total) by mouth daily as needed. For BP over 150/100. 30 tablet 3  . fenofibrate 54 MG tablet Take 54 mg by mouth daily.    . furosemide (LASIX) 40 MG tablet Take 1 tablet (40 mg total) by mouth daily. 30 tablet 3  . insulin NPH Human (HUMULIN N) 100 UNIT/ML injection Inject 0.2 mLs (20 Units total) into the skin 2 (two) times daily. 10 mL 11  . Ipratropium-Albuterol (COMBIVENT) 20-100 MCG/ACT AERS respimat Inhale 2 puffs into the lungs every 6 (six) hours as needed for wheezing. 1 Inhaler 0  . isosorbide mononitrate (IMDUR) 120 MG 24 hr tablet TAKE ONE TABLET BY MOUTH ONE TIME DAILY 30 tablet 3  . labetalol (NORMODYNE) 200 MG tablet Take 200 mg by mouth 2 (two) times daily.    . nitroGLYCERIN (NITROSTAT) 0.4 MG SL tablet Place 1 tablet (0.4 mg total) under the tongue every 5 (five) minutes x 3 doses as needed for chest pain. 30 tablet 11  .  omeprazole (PRILOSEC) 20 MG capsule Take 20 mg by mouth daily.    Marland Kitchen glucose blood (ACCU-CHEK AVIVA PLUS) test strip Test Blood sugar tid. E11.9 100 each 5  . lactose free nutrition (BOOST PLUS) LIQD Take 237 mLs by mouth 3 (three) times daily with meals. (Patient not taking: Reported on 07/16/2015)  0   No current facility-administered medications for this visit.    Past Medical History  Diagnosis Date  . CAD (coronary artery disease) 2003    Last catheterization 2008. Two-vessel PCI of the first OM branch and mid LAD.  Marland Kitchen CVA (cerebral vascular accident) 1996    LEFT SIDE   . IDDM (insulin dependent diabetes mellitus)   . CKD (chronic kidney disease)   . Congestive heart failure   . Hypercholesteremia   . Hypertension   . Dementia   . DJD (degenerative joint disease) of lumbar spine     Past Surgical History  Procedure Laterality Date  . Appendectomy    . Knee surgery      ROS:  As stated in the HPI and negative for all other systems.  PHYSICAL EXAM BP 182/74 mmHg  Pulse 64  Ht 5\' 4"  (1.626 m)  Wt 141 lb (63.957 kg)  BMI 24.19 kg/m2 GENERAL:  Well appearing NECK:  No jugular venous distention, waveform within normal limits, carotid upstroke brisk and symmetric, no bruits, positive bilateral transmitted systolic murmur, no thyromegaly LUNGS:  Clear to auscultation bilaterally BACK:  No CVA tenderness CHEST:  Unremarkable HEART:  PMI not displaced or sustained,S1 and S2 within normal limits, no S3, no S4, no clicks, no rubs, apical systolic murmurs, increases with the strain phase of valsava and radiating out the carotids. ABD:  Flat, positive bowel sounds normal in frequency in pitch, no bruits, no rebound, no guarding, no midline pulsatile mass, no hepatomegaly, no splenomegaly EXT:  2 plus pulses throughout, no cyanosis no , mild right greater than left ankle swelling.  Varicose veins   ASSESSMENT AND PLAN  CAD, NATIVE VESSEL -  At this point she doesn't sound like  she's having any anginal symptoms. No change in therapy is indicated.  HYPERTENSION, BENIGN -  The blood pressure is elevated.  However, it has been quite labile.  At this point I don't plan to change her therapy.  She will need to follow her BP.  She has a home health nurse who can follow up on this.  DIASTOLIC HEART FAILURE, CHRONIC -  She seems to be euvolemic. She will continue the meds as listed.  CKD (chronic kidney disease) -  I will check her BMET.  CARDIAC MURMUR, SYSTOLIC -  I reviewed the results of her recent echo.  She has aortic valve calcification but no stenosis.  She has moderate MR.  No further imaging is indicated.

## 2015-07-23 ENCOUNTER — Encounter: Payer: Self-pay | Admitting: Cardiology

## 2015-07-24 ENCOUNTER — Telehealth: Payer: Self-pay | Admitting: *Deleted

## 2015-07-24 DIAGNOSIS — Z79899 Other long term (current) drug therapy: Secondary | ICD-10-CM

## 2015-07-24 DIAGNOSIS — I10 Essential (primary) hypertension: Secondary | ICD-10-CM

## 2015-07-24 NOTE — Telephone Encounter (Signed)
Attempted to contact pt to discuss need for lab work - there was no answer.  Will continue to attempt to contact her by phone.

## 2015-07-24 NOTE — Telephone Encounter (Signed)
-----   Message from Minus Breeding, MD sent at 07/22/2015  6:17 PM EDT ----- She needs a BMET.  Thanks.

## 2015-07-29 NOTE — Telephone Encounter (Signed)
NA at pt's home number.  Will continue to attempt to contact to discuss need for further lab work.

## 2015-07-30 ENCOUNTER — Other Ambulatory Visit: Payer: Medicare Other | Admitting: *Deleted

## 2015-07-30 NOTE — Patient Outreach (Signed)
[facility]) Care Management  07/30/2015  Virda Betters Letourneau 1932-06-14 [id number]  Pt was not home for her scheduled home visit today. I attempted to call her numerous times and it seems the phone may be off the hook as it is continually busy. I have also tried to call family members to see if I can contact them to get Mrs. Sunde a message.  I did talk with Dr. Florentina Addison office and they informed me pt was a NO SHOW for an appt with them and she did not call to reschedule. They informed me she also needs an exxtensive list of labs done before she does reschedule. I asked about the Normadyne and the staff member looked at pts' recorde and told me she is supposed to be on this. I told her when I see or talk to pt I will ask her to get her labs done and reschedule her appt.  I will keep trying to contact the pt.  Deloria Lair [facility] [phone]

## 2015-08-04 ENCOUNTER — Other Ambulatory Visit: Payer: Self-pay | Admitting: *Deleted

## 2015-08-04 NOTE — Patient Outreach (Signed)
I have attempted to call the pt numerous times and also her family members. I have left messages on those phones that I can but have not received a call back. I have called the cardiologist office and primary care office to see if they have any different numbers but to no avail. I left a message with [name]'s, FNP,  practice, [place] to advise pt that I am trying to reach her and to please call me. I will continue to call the pt this week in hopes that I can reach her.  Deloria Lair [facility] [place] [phone]

## 2015-08-06 ENCOUNTER — Other Ambulatory Visit: Payer: Self-pay | Admitting: *Deleted

## 2015-08-06 NOTE — Patient Outreach (Signed)
I was finally able to reach pt by phone this am. She said she has trouble with her phone and TV and is in the process of having [employer] cable reinstalled. She says sometimes she doesn't hear the phone. She has had another nurse visit her she thinks it may have been someone from [facility]. They gave her an umbrella. Pt agreed to an appt with me tomorrow morning at 10:00 am. I told her I had been in touch with Dr. Florentina Addison office and they told me she had missed an appt and needs to reschedule and to get labs drawn at primary care office before she goes to nephrology visit. We will discuss and plan these tomoroow.  Deloria Lair [facility] [place] [phone]

## 2015-08-07 ENCOUNTER — Other Ambulatory Visit: Payer: Self-pay | Admitting: *Deleted

## 2015-08-07 NOTE — Patient Outreach (Signed)
[place]) Care Management   08/07/2015  [name] [birth year] [id number]  [name] is an 81 y.o. female  Subjective: Pt feeling well today. She admits to forgetting to take her meds everyday. No SOB, CP, or increased edema.   Objective: BP 126/40 mmHg  Pulse 68  Resp 18  Wt 138 lb (62.596 kg)  SpO2 98% FBS 113 Review of Systems  Constitutional: Negative.   HENT: Positive for hearing loss.   Eyes: Negative.   Respiratory: Negative.   Cardiovascular: Positive for leg swelling.       R lower extremity, swells since CVA 2009. No edema in L.  Gastrointestinal: Negative.   Genitourinary: Negative.   Musculoskeletal: Negative.   Skin: Negative.   Neurological: Negative.   Endo/Heme/Allergies: Bruises/bleeds easily.  Psychiatric/Behavioral: Negative.     Physical Exam  Constitutional: She is oriented to person, place, and time. She appears well-developed and well-nourished.  HENT:  Head: Normocephalic.  Cardiovascular: Normal rate, regular rhythm and normal heart sounds.   Respiratory: Effort normal and breath sounds normal.  GI: Soft. Bowel sounds are normal.  Musculoskeletal: Normal range of motion.  Neurological: She is alert and oriented to person, place, and time.  Skin: Skin is warm and dry.  Psychiatric: She has a normal mood and affect.    Current Medications:   Current Outpatient Prescriptions  Medication Sig Dispense Refill  . amLODipine (NORVASC) 10 MG tablet TAKE ONE TABLET BY MOUTH ONE TIME DAILY 90 tablet 1  . aspirin EC 81 MG tablet Take 81 mg by mouth daily.    . furosemide (LASIX) 40 MG tablet Take 1 tablet (40 mg total) by mouth daily. 30 tablet 3  . insulin NPH Human (HUMULIN N) 100 UNIT/ML injection Inject 0.2 mLs (20 Units total) into the skin 2 (two) times daily. 10 mL 11  . isosorbide mononitrate (IMDUR) 120 MG 24 hr tablet TAKE ONE TABLET BY MOUTH ONE TIME DAILY 30 tablet 3  . lisinopril (PRINIVIL,ZESTRIL) 40 MG tablet  Take 40 mg by mouth daily.    . nitroGLYCERIN (NITROSTAT) 0.4 MG SL tablet Place 1 tablet (0.4 mg total) under the tongue every 5 (five) minutes x 3 doses as needed for chest pain. 30 tablet 11  . omeprazole (PRILOSEC) 20 MG capsule Take 20 mg by mouth daily.    . cloNIDine (CATAPRES) 0.1 MG tablet Take 1 tablet (0.1 mg total) by mouth daily as needed. For BP over 150/100. (Patient not taking: Reported on 08/07/2015) 30 tablet 3  . fenofibrate 54 MG tablet Take 54 mg by mouth daily.    Marland Kitchen glucose blood (ACCU-CHEK AVIVA PLUS) test strip Test Blood sugar tid. E11.9 100 each 5  . Ipratropium-Albuterol (COMBIVENT) 20-100 MCG/ACT AERS respimat Inhale 2 puffs into the lungs every 6 (six) hours as needed for wheezing. (Patient not taking: Reported on 08/07/2015) 1 Inhaler 0  . labetalol (NORMODYNE) 200 MG tablet Take 200 mg by mouth 2 (two) times daily.    Marland Kitchen lactose free nutrition (BOOST PLUS) LIQD Take 237 mLs by mouth 3 (three) times daily with meals. (Patient not taking: Reported on 07/16/2015)  0   No current facility-administered medications for this visit.    Functional Status:   In your present state of health, do you have any difficulty performing the following activities: 06/02/2015 05/10/2015  Hearing? - N  Vision? - N  Difficulty concentrating or making decisions? - N  Walking or climbing stairs? - N  Dressing or  bathing? - N  Doing errands, shopping? - N  Conservation officer, nature and eating ? N -  Using the Toilet? N -  In the past six months, have you accidently leaked urine? N -  Do you have problems with loss of bowel control? N -  Managing your Medications? N -  Managing your Finances? N -  Housekeeping or managing your Housekeeping? N -    Fall/Depression Screening:    PHQ 2/9 Scores 06/02/2015 05/08/2015 10/30/2014 09/30/2014 03/21/2014 03/21/2014 08/09/2013  PHQ - 2 Score 0 0 3 0 0 0 0  PHQ- 9 Score - - 10 - - - -    Assessment:  Medication noncompliance                          MCI - forgets  appts, meds.                  Plan: Follow up appt Monday Sept 19th at 10 am.           Pt to go have labs done for Dr. [name] this week and we made appt to see Dr. [name] on Oct. 5th at noon.           Filled up med box for 2 weeks. Called in refill for isosorbide.

## 2015-08-12 NOTE — Telephone Encounter (Signed)
Spoke with patient whose phone has been out of order.  She is aware of the need for lab work.  The order has been placed and she will have it drawn at her PCP office.

## 2015-08-13 ENCOUNTER — Other Ambulatory Visit (INDEPENDENT_AMBULATORY_CARE_PROVIDER_SITE_OTHER): Payer: Medicare Other

## 2015-08-13 DIAGNOSIS — N183 Chronic kidney disease, stage 3 unspecified: Secondary | ICD-10-CM

## 2015-08-13 DIAGNOSIS — E559 Vitamin D deficiency, unspecified: Secondary | ICD-10-CM | POA: Diagnosis not present

## 2015-08-13 DIAGNOSIS — R809 Proteinuria, unspecified: Secondary | ICD-10-CM | POA: Diagnosis not present

## 2015-08-13 DIAGNOSIS — I10 Essential (primary) hypertension: Secondary | ICD-10-CM | POA: Diagnosis not present

## 2015-08-13 DIAGNOSIS — D519 Vitamin B12 deficiency anemia, unspecified: Secondary | ICD-10-CM | POA: Diagnosis not present

## 2015-08-13 DIAGNOSIS — Z79899 Other long term (current) drug therapy: Secondary | ICD-10-CM

## 2015-08-13 NOTE — Progress Notes (Signed)
Labs for dr. befekadu  

## 2015-08-14 LAB — FERRITIN: FERRITIN: 816 ng/mL — AB (ref 15–150)

## 2015-08-14 LAB — PROTEIN / CREATININE RATIO, URINE
CREATININE, UR: 66.8 mg/dL
Protein, Ur: 109.9 mg/dL
Protein/Creat Ratio: 1645 mg/g creat — ABNORMAL HIGH (ref 0–200)

## 2015-08-14 LAB — HEMATOCRIT: Hematocrit: 28.7 % — ABNORMAL LOW (ref 34.0–46.6)

## 2015-08-14 LAB — B12 AND FOLATE PANEL
FOLATE: 10.2 ng/mL (ref >3.0)
Vitamin B-12: 300 pg/mL (ref 211–946)

## 2015-08-14 LAB — PARATHYROID HORMONE, INTACT (NO CA): PTH: 138 pg/mL — ABNORMAL HIGH (ref 15–65)

## 2015-08-14 LAB — BMP8+EGFR
BUN / CREAT RATIO: 16 (ref 11–26)
BUN: 40 mg/dL — AB (ref 8–27)
CO2: 21 mmol/L (ref 18–29)
CREATININE: 2.46 mg/dL — AB (ref 0.57–1.00)
Calcium: 10.4 mg/dL — ABNORMAL HIGH (ref 8.7–10.3)
Chloride: 102 mmol/L (ref 97–108)
GFR, EST AFRICAN AMERICAN: 21 mL/min/{1.73_m2} — AB (ref >59)
GFR, EST NON AFRICAN AMERICAN: 18 mL/min/{1.73_m2} — AB (ref >59)
Glucose: 116 mg/dL — ABNORMAL HIGH (ref 65–99)
Potassium: 5.2 mmol/L (ref 3.5–5.2)
Sodium: 142 mmol/L (ref 134–144)

## 2015-08-14 LAB — IRON AND TIBC
Iron Saturation: 33 % (ref 15–55)
Iron: 72 ug/dL (ref 27–139)
TIBC: 221 ug/dL — AB (ref 250–450)
UIBC: 149 ug/dL (ref 118–369)

## 2015-08-14 LAB — HEMOGLOBIN: Hemoglobin: 9.5 g/dL — ABNORMAL LOW (ref 11.1–15.9)

## 2015-08-14 LAB — VITAMIN D 25 HYDROXY (VIT D DEFICIENCY, FRACTURES): Vit D, 25-Hydroxy: 18.8 ng/mL — ABNORMAL LOW (ref 30.0–100.0)

## 2015-08-14 LAB — RENAL FUNCTION PANEL
ALBUMIN: 4.5 g/dL (ref 3.5–4.7)
PHOSPHORUS: 3.9 mg/dL (ref 2.5–4.5)

## 2015-08-17 ENCOUNTER — Other Ambulatory Visit: Payer: Self-pay | Admitting: *Deleted

## 2015-08-17 NOTE — Patient Outreach (Signed)
[place] [facility]) Care Management   08/17/2015  [name] December 19, 1931 [id number]  [name] is an 81 y.o. female  Subjective: Pt reports her 86 year old daughter, [name], died last week. She seems to be coping OK but did tallk about it a lot during my visit. She says she has been good about taking her meds from the med box. She has had her labs drawn that Dr. [name] wanted and she will see him Oct.   Objective:  BP 110/40 mmHg  Pulse 77  Resp 18  Wt 138 lb (62.596 kg)  SpO2 98% FBS 148.  Review of Systems  Constitutional: Negative.   HENT: Negative.   Eyes: Negative.   Respiratory: Negative.   Cardiovascular: Negative.   Gastrointestinal: Negative.   Genitourinary: Negative.   Musculoskeletal:       Pt has protrusion on lateral aspect of her L foot, ? Bony abmornality or cyst. Pt reports it hurts. It has been there about 2 years.  Skin: Negative.   Neurological: Negative.   Endo/Heme/Allergies: Bruises/bleeds easily.  Psychiatric/Behavioral: Negative.     Physical Exam  Constitutional: She is oriented to person, place, and time. She appears well-developed and well-nourished.  HENT:  Head: Normocephalic.  Neck: Normal range of motion.  Cardiovascular: Normal rate and regular rhythm.   Murmur heard. Respiratory: Effort normal and breath sounds normal.  GI: Soft. Bowel sounds are normal.  Musculoskeletal:  Limited ROM but very functional.  Neurological: She is alert and oriented to person, place, and time.  Skin: Skin is warm and dry.    Current Medications:   Current Outpatient Prescriptions  Medication Sig Dispense Refill  . amLODipine (NORVASC) 10 MG tablet TAKE ONE TABLET BY MOUTH ONE TIME DAILY 90 tablet 1  . aspirin EC 81 MG tablet Take 81 mg by mouth daily.    . furosemide (LASIX) 40 MG tablet Take 1 tablet (40 mg total) by mouth daily. 30 tablet 3  . glucose blood (ACCU-CHEK AVIVA PLUS) test strip Test Blood sugar tid. E11.9 100 each 5   . insulin NPH Human (HUMULIN N) 100 UNIT/ML injection Inject 0.2 mLs (20 Units total) into the skin 2 (two) times daily. 10 mL 11  . isosorbide mononitrate (IMDUR) 120 MG 24 hr tablet TAKE ONE TABLET BY MOUTH ONE TIME DAILY 30 tablet 3  . lactose free nutrition (BOOST PLUS) LIQD Take 237 mLs by mouth 3 (three) times daily with meals.  0  . lisinopril (PRINIVIL,ZESTRIL) 40 MG tablet Take 40 mg by mouth daily.    Marland Kitchen omeprazole (PRILOSEC) 20 MG capsule Take 20 mg by mouth daily.    . cloNIDine (CATAPRES) 0.1 MG tablet Take 1 tablet (0.1 mg total) by mouth daily as needed. For BP over 150/100. (Patient not taking: Reported on 08/07/2015) 30 tablet 3  . fenofibrate 54 MG tablet Take 54 mg by mouth daily.    . Ipratropium-Albuterol (COMBIVENT) 20-100 MCG/ACT AERS respimat Inhale 2 puffs into the lungs every 6 (six) hours as needed for wheezing. (Patient not taking: Reported on 08/17/2015) 1 Inhaler 0  . labetalol (NORMODYNE) 200 MG tablet Take 200 mg by mouth 2 (two) times daily.    . nitroGLYCERIN (NITROSTAT) 0.4 MG SL tablet Place 1 tablet (0.4 mg total) under the tongue every 5 (five) minutes x 3 doses as needed for chest pain. (Patient not taking: Reported on 08/17/2015) 30 tablet 11   No current facility-administered medications for this visit.    Functional  Status:   In your present state of health, do you have any difficulty performing the following activities: 06/02/2015 05/10/2015  Hearing? - N  Vision? - N  Difficulty concentrating or making decisions? - N  Walking or climbing stairs? - N  Dressing or bathing? - N  Doing errands, shopping? - N  Conservation officer, nature and eating ? N -  Using the Toilet? N -  In the past six months, have you accidently leaked urine? N -  Do you have problems with loss of bowel control? N -  Managing your Medications? N -  Managing your Finances? N -  Housekeeping or managing your Housekeeping? N -    Fall/Depression Screening:    PHQ 2/9 Scores 06/02/2015 05/08/2015  10/30/2014 09/30/2014 03/21/2014 03/21/2014 08/09/2013  PHQ - 2 Score 0 0 3 0 0 0 0  PHQ- 9 Score - - 10 - - - -    Assessment:  HF - improved self management                          Diabetes - fair control                           CKD  Plan:  Reinforced her good self management.            Still unsure if pt should be taking lisinopril or normodyne. She IS TAKING lisinopril and amlodipine but not normodyne. I think her BP would drop too low with all 3 meds. Pt to see Dr. [name] Oct. 5th. I will return in 2 weeks. I will call daughter if she will start filling the med box after I leave.  THN CM Care Plan Problem One        Most Recent Value   Care Plan Problem One  High risk for falls   Role Documenting the Problem One  Care Management [place] for Problem One  Active   THN Long Term Goal (31-90 days)  Pt will use her cane and exercise caution when walking and avoid falling over the next 90 days.   Interventions for Problem One Long Term Goal  Pt denies falls. Encouraged to use her cane.    THN CM Care Plan Problem Two        Most Recent Value   Care Plan Problem Two  Pt needs more reinforcement about her self care for disease management.   Role Documenting the Problem Two  Care Management [place] for Problem Two  Active   Interventions for Problem Two Long Term Goal   Encouraged continued daily wts. Avoid salt. Report probelms to NP or MD.. Filled med box. Called in Rx needed..   THN Long Term Goal (31-90) days  Pt will demonstrate her abililty to follow HF self management (daily wts, low salt diet, identify problems in timely fashion, take meds regularly) by the end of 90 days.   THN Long Term Goal Start Date  07/07/15   THN CM Short Term Goal #1 (0-30 days)  Pt will be able to confidently identify meds and their purpose in the next 30 days.   THN CM Short Term Goal #1 Start Date  07/07/15   THN CM Short Term Goal #1 Met Date   08/17/15   THN CM Short  Term Goal #2 (0-30 days)  Pt will take pills 50% of the time  in the next 2 weeks.   THN CM Short Term Goal #2 Start Date  08/07/15   THN CM Short Term Goal #2 Met Date  -- [not met, many pills still in box.]   Interventions for Short Term Goal #2  Filled med box.     Deloria Lair [facility] [place] [phone]

## 2015-08-31 ENCOUNTER — Ambulatory Visit: Payer: Self-pay | Admitting: *Deleted

## 2015-09-01 ENCOUNTER — Other Ambulatory Visit: Payer: Self-pay | Admitting: *Deleted

## 2015-09-01 ENCOUNTER — Ambulatory Visit: Payer: Self-pay | Admitting: *Deleted

## 2015-09-01 NOTE — Patient Outreach (Signed)
[facility]) Care Management   09/01/2015  Tenae Graziosi Kamiya 04-May-1932 [id number]  Illeana Edick Burnstein is an 81 y.o. female  Subjective: Pt reports having had a fall one week ago. She had to lie on the floor for 20 min. Before she could muster the strength to get up. She sustained some minor contusions and abrasions. She did not need to seek medical attention.  She has had her labs done for her appt with Dr.Befekadu tomorrow.  She reports her glucose monitor got damaged and she replaced it but didn't get any strips.  Her [employer] Heart failure monitoring system is not working.  Objective:   Review of Systems  Constitutional: Negative.   HENT: Negative.   Eyes: Negative.   Respiratory: Negative.   Cardiovascular: Negative.   Gastrointestinal: Negative.   Genitourinary: Negative.   Musculoskeletal: Positive for falls.  Skin:       Several contusions and skin tears from a fall one week ago.  Neurological: Negative.   Endo/Heme/Allergies: Negative.   Psychiatric/Behavioral: Negative.    BP 150/44 mmHg  Pulse 71  Resp 18  Wt 144 lb (65.318 kg)  SpO2 98% Last CBG check before her monitor stopped working was 137.  Physical Exam  Constitutional: She is oriented to person, place, and time. She appears well-developed and well-nourished.  HENT:  Head: Normocephalic.  Neck: Normal range of motion.  Cardiovascular: Normal rate and regular rhythm.   Murmur heard. Respiratory: Effort normal and breath sounds normal.  GI: Soft. Bowel sounds are normal.  Musculoskeletal:  Limited ROM and pt is favoring her left foot when walking.  Neurological: She is alert and oriented to person, place, and time.  Skin: Skin is warm and dry.  Psychiatric: She has a normal mood and affect.    Current Medications:   Current Outpatient Prescriptions  Medication Sig Dispense Refill  . amLODipine (NORVASC) 10 MG tablet TAKE ONE TABLET BY MOUTH ONE TIME DAILY 90 tablet 1  . aspirin EC 81  MG tablet Take 81 mg by mouth daily.    . cloNIDine (CATAPRES) 0.1 MG tablet Take 1 tablet (0.1 mg total) by mouth daily as needed. For BP over 150/100. (Patient not taking: Reported on 08/07/2015) 30 tablet 3  . fenofibrate 54 MG tablet Take 54 mg by mouth daily.    . furosemide (LASIX) 40 MG tablet Take 1 tablet (40 mg total) by mouth daily. 30 tablet 3  . glucose blood (ACCU-CHEK AVIVA PLUS) test strip Test Blood sugar tid. E11.9 100 each 5  . insulin NPH Human (HUMULIN N) 100 UNIT/ML injection Inject 0.2 mLs (20 Units total) into the skin 2 (two) times daily. 10 mL 11  . Ipratropium-Albuterol (COMBIVENT) 20-100 MCG/ACT AERS respimat Inhale 2 puffs into the lungs every 6 (six) hours as needed for wheezing. (Patient not taking: Reported on 08/17/2015) 1 Inhaler 0  . isosorbide mononitrate (IMDUR) 120 MG 24 hr tablet TAKE ONE TABLET BY MOUTH ONE TIME DAILY 30 tablet 3  . labetalol (NORMODYNE) 200 MG tablet Take 200 mg by mouth 2 (two) times daily.    Marland Kitchen lactose free nutrition (BOOST PLUS) LIQD Take 237 mLs by mouth 3 (three) times daily with meals.  0  . lisinopril (PRINIVIL,ZESTRIL) 40 MG tablet Take 40 mg by mouth daily.    . nitroGLYCERIN (NITROSTAT) 0.4 MG SL tablet Place 1 tablet (0.4 mg total) under the tongue every 5 (five) minutes x 3 doses as needed for chest pain. (Patient not taking:  Reported on 08/17/2015) 30 tablet 11  . omeprazole (PRILOSEC) 20 MG capsule Take 20 mg by mouth daily.     No current facility-administered medications for this visit.    Functional Status:   In your present state of health, do you have any difficulty performing the following activities: 06/02/2015 05/10/2015  Hearing? - N  Vision? - N  Difficulty concentrating or making decisions? - N  Walking or climbing stairs? - N  Dressing or bathing? - N  Doing errands, shopping? - N  Conservation officer, nature and eating ? N -  Using the Toilet? N -  In the past six months, have you accidently leaked urine? N -  Do you have  problems with loss of bowel control? N -  Managing your Medications? N -  Managing your Finances? N -  Housekeeping or managing your Housekeeping? N -    Fall/Depression Screening:    PHQ 2/9 Scores 06/02/2015 05/08/2015 10/30/2014 09/30/2014 03/21/2014 03/21/2014 08/09/2013  PHQ - 2 Score 0 0 3 0 0 0 0  PHQ- 9 Score - - 10 - - - -    Assessment:  Recent Fall with minor injuries.   HF stable   Diabetes  Plan: Ordered new glucose monitor with strips for bid testing  Filled med box for 2 weeks.  Called in Rx for Amlodipine 10 mg one po daily #90 and 3 refills    Called in refill for her isosorbide mono 120 mg one po daily. NO REFILLS LEFT NOW.  Fixed telephone so she could get messages.  Fixed [employer] HF monitoring system and completed assessment for today.  Applied TED stockings to reduce edema.  I will see pt in 2 weeks. THN CM Care Plan Problem One        Most Recent Value   Care Plan Problem One  High risk for falls   Role Documenting the Problem One  Care Management [place] for Problem One  Active   THN Long Term Goal (31-90 days)  Pt will use her cane and exercise caution when walking and avoid falling over the next 90 days.   Interventions for Problem One Long Term Goal  Pt denies falls. Encouraged to use her cane.    THN CM Care Plan Problem Two        Most Recent Value   Care Plan Problem Two  Pt needs more reinforcement about her self care for disease management.   Role Documenting the Problem Two  Care Management [place]   Care Plan for Problem Two  Active   Interventions for Problem Two Long Term Goal   Pt doing well following her HF self management regimen    THN Long Term Goal (31-90) days  Pt will demonstrate her abililty to follow HF self management (daily wts, low salt diet, identify problems in timely fashion, take meds regularly) by the end of 90 days.   THN Long Term Goal Start Date  07/07/15   THN CM Short Term Goal #1 (0-30 days)  Pt will be able to  confidently identify meds and their purpose in the next 30 days.   THN CM Short Term Goal #1 Start Date  07/07/15   [facility] CM Short Term Goal #1 Met Date   08/17/15   Interventions for Short Term Goal #2   rovided pt a med list with dosage, why she takes med, what time to take med, and what it looks like. I hope to enlist her  grand daughter to take over her med bos filling after I close her case. Filled med box.   THN CM Short Term Goal #2 (0-30 days)  Pt will take pills 50% of the time in the next 2 weeks.   THN CM Short Term Goal #2 Start Date  08/07/15   [facility] CM Short Term Goal #2 Met Date  09/01/15 [not met, many pills still in box.]   Interventions for Short Term Goal #2  Filled med box.     Deloria Lair [facility] [place] [phone]

## 2015-09-02 DIAGNOSIS — R809 Proteinuria, unspecified: Secondary | ICD-10-CM | POA: Diagnosis not present

## 2015-09-02 DIAGNOSIS — D649 Anemia, unspecified: Secondary | ICD-10-CM | POA: Diagnosis not present

## 2015-09-02 DIAGNOSIS — N184 Chronic kidney disease, stage 4 (severe): Secondary | ICD-10-CM | POA: Diagnosis not present

## 2015-09-02 DIAGNOSIS — E559 Vitamin D deficiency, unspecified: Secondary | ICD-10-CM | POA: Diagnosis not present

## 2015-09-04 ENCOUNTER — Other Ambulatory Visit: Payer: Self-pay | Admitting: *Deleted

## 2015-09-04 NOTE — Patient Outreach (Addendum)
Called pt to see how she is doing. She reports her peripheral edema is good in the am and increases as the day goes on. She has not been able to put on the TED hose by herself since I put them on Tuesday. She reports she had a good visit with the nephrologist. She was told she does not need dialysis and that her kidneys were functioning OK. She says she did take the medication list to this offfice visit and shared it with the MD which was helpful. I will call her again next week. I am also going to call Magda Paganini, pt grand daughter to see if she might be able to help with pt med box filling.  Deloria Lair GNP-BC [place] [phone]  No medication changes were made at the nephrology visit. [place] daughter has agreed to meet with [place] on 09/15/15.  Deloria Lair [facility] [place] [phone]

## 2015-09-15 ENCOUNTER — Encounter: Payer: Self-pay | Admitting: Family Medicine

## 2015-09-15 ENCOUNTER — Other Ambulatory Visit: Payer: Self-pay | Admitting: *Deleted

## 2015-09-15 ENCOUNTER — Ambulatory Visit (INDEPENDENT_AMBULATORY_CARE_PROVIDER_SITE_OTHER): Payer: Medicare Other | Admitting: Family Medicine

## 2015-09-15 VITALS — BP 158/70 | HR 82 | Temp 99.0°F | Ht 64.0 in | Wt 146.8 lb

## 2015-09-15 DIAGNOSIS — M25476 Effusion, unspecified foot: Secondary | ICD-10-CM

## 2015-09-15 DIAGNOSIS — I10 Essential (primary) hypertension: Secondary | ICD-10-CM | POA: Diagnosis not present

## 2015-09-15 NOTE — Progress Notes (Signed)
Subjective:    Patient ID: [name], female    DOB: 10/25/1932, 81 y.o.   MRN: [id number]  HPI 81 year old female with congestive heart failure, type 2 diabetes and chronic kidney disease. She tells me that she saw a kidney specialist recently but she doesn't know any details of what they did except I told her renal function was better. She denies shortness of breath but she is having some more edema. When asked if any of that her pills make her kidneys work a lot she denies that so I'm wondering if she is really taking her diuretic. He is visited by a home health nurse who helps cannot her pills and manage them. She does live alone. She still drives. But she needs lots of help with administration of her medicines.  Patient Active Problem List   Diagnosis Date Noted  . Acute bronchitis due to other specified organisms   . Urinary tract infectious disease   . Acute diastolic CHF (congestive heart failure) ([place])   . Pulmonary hypertension ([place])   . Diabetes type 2, uncontrolled ([place])   . Chronic kidney disease, stage IV (severe) ([place])   . Other constipation   . Failure to thrive in adult   . Hyperkalemia   . Hypomagnesemia   . Coronary atherosclerosis of native coronary artery: stress test 2013 showing inferior and distal anteroseptal ischemia 05/11/2015  . Anemia in chronic kidney disease 05/11/2015  . Abnormal urinalysis 05/11/2015  . Angina at [facility]) 05/09/2015  . Rhabdomyolysis 03/21/2014  . Benign paroxysmal positional vertigo 03/21/2014  . Avulsion fracture of distal phalanx of finger 03/17/2014  . HTN (hypertension) 08/09/2013  . Palpitations 06/21/2013  . Lumbar spondylosis 05/16/2013  . Hyponatremia 05/15/2013  . Overweight 05/15/2013  . Acute renal insufficiency 05/15/2013  . Perianal abscess 05/14/2013  . Heat exhaustion 05/14/2013  . Fall 05/14/2013  . Medication management 03/22/2013  . Varicose veins of lower extremities with other complications 48/54/6270    . Swelling of foot joint 03/01/2013  . Osteoarthritis 03/01/2013  . Chronic anemia 10/29/2012  . Hypoglycemia 10/29/2012  . Acute diastolic congestive heart failure ([place]) 10/29/2012  . NSVT (nonsustained ventricular tachycardia) ([place]) 10/28/2012  . OA (osteoarthritis) 10/27/2012  . Acute renal failure ([place]) 10/26/2012  . Unstable angina ([place]) 10/24/2012  . Chest pain 10/21/2012  . Hypertension 10/26/2011  . DM (diabetes mellitus) ([place]) 03/30/2011  . CKD (chronic kidney disease) stage 4, GFR 15-29 ml/min (HCC) 03/30/2011  . HOCM 09/15/2010  . DIASTOLIC HEART FAILURE, CHRONIC 07/20/2010  . CARDIAC MURMUR, SYSTOLIC 35/00/9381  . Cerebrovascular disease 03/09/2010  . Hyperlipidemia 12/05/2008  . Arteriosclerotic cardiovascular disease (ASCVD) 12/05/2008   Outpatient Encounter Prescriptions as of 09/15/2015  Medication Sig  . amLODipine (NORVASC) 10 MG tablet TAKE ONE TABLET BY MOUTH ONE TIME DAILY  . aspirin EC 81 MG tablet Take 81 mg by mouth daily.  . cloNIDine (CATAPRES) 0.1 MG tablet Take 1 tablet (0.1 mg total) by mouth daily as needed. For BP over 150/100.  . fenofibrate 54 MG tablet Take 54 mg by mouth daily.  . furosemide (LASIX) 40 MG tablet Take 1 tablet (40 mg total) by mouth daily.  Marland Kitchen glucose blood (ACCU-CHEK AVIVA PLUS) test strip Test Blood sugar tid. E11.9  . insulin NPH Human (HUMULIN N) 100 UNIT/ML injection Inject 0.2 mLs (20 Units total) into the skin 2 (two) times daily.  . Ipratropium-Albuterol (COMBIVENT) 20-100 MCG/ACT AERS respimat Inhale 2 puffs into the lungs every 6 (six) hours  as needed for wheezing.  . isosorbide mononitrate (IMDUR) 120 MG 24 hr tablet TAKE ONE TABLET BY MOUTH ONE TIME DAILY  . labetalol (NORMODYNE) 200 MG tablet Take 200 mg by mouth 2 (two) times daily.  Marland Kitchen lactose free nutrition (BOOST PLUS) LIQD Take 237 mLs by mouth 3 (three) times daily with meals.  Marland Kitchen lisinopril (PRINIVIL,ZESTRIL) 40 MG tablet Take 40 mg by mouth daily.  .  nitroGLYCERIN (NITROSTAT) 0.4 MG SL tablet Place 1 tablet (0.4 mg total) under the tongue every 5 (five) minutes x 3 doses as needed for chest pain.  Marland Kitchen omeprazole (PRILOSEC) 20 MG capsule Take 20 mg by mouth daily.   No facility-administered encounter medications on file as of 09/15/2015.      Review of Systems  Constitutional: Negative.   HENT: Negative.   Respiratory: Negative.   Cardiovascular: Negative.   Neurological: Negative.   Psychiatric/Behavioral: Positive for confusion.       Objective:   Physical Exam  Constitutional: She appears well-developed.  HENT:  Head: Normocephalic.  Cardiovascular: Normal rate and regular rhythm.   Pulmonary/Chest: Effort normal and breath sounds normal. She has no rales.  Musculoskeletal: She exhibits edema.  3+ edema at the ankles and feet          Assessment & Plan:  1. Essential hypertension Blood pressure is fair. She continues with lisinopril clonidine and amlodipine  2. Swelling of foot joint, unspecified laterality I think her edema is a combination of her chronic kidney disease and heart failure. Will talk to her home health nurse to see if it looks like she is taking her diuretic regularly  Wardell Honour MD

## 2015-09-15 NOTE — Patient Outreach (Signed)
[facility]) Care Management   09/15/2015  [name] [birth year] [id number]  [name] is an 81 y.o. female  Subjective:   Objective:   Review of Systems  Constitutional: Negative.   HENT: Negative.   Eyes: Negative.   Respiratory: Negative.   Cardiovascular: Positive for leg swelling.       3+ edema in R leg, 2+ in L.  Gastrointestinal: Negative.   Genitourinary: Negative.   Musculoskeletal: Negative.   Skin: Negative.   Neurological: Negative.   Endo/Heme/Allergies: Bruises/bleeds easily.  Psychiatric/Behavioral: Negative.    BP 130/40 mmHg  Pulse 82  Resp 18  Wt 146 lb (66.225 kg)  SpO2 98%   Physical Exam  Constitutional: She is oriented to person, place, and time. She appears well-developed and well-nourished.  HENT:  Head: Normocephalic.  Neck: Normal range of motion.  Cardiovascular: Normal rate, regular rhythm and normal heart sounds.   Respiratory: Effort normal and breath sounds normal.  GI: Soft. Bowel sounds are normal.  Musculoskeletal: She exhibits edema.  Neurological: She is alert and oriented to person, place, and time.  Skin: Skin is warm and dry.    Current Medications:   Current Outpatient Prescriptions  Medication Sig Dispense Refill  . amLODipine (NORVASC) 10 MG tablet TAKE ONE TABLET BY MOUTH ONE TIME DAILY 90 tablet 1  . aspirin EC 81 MG tablet Take 81 mg by mouth daily.    . furosemide (LASIX) 40 MG tablet Take 1 tablet (40 mg total) by mouth daily. 30 tablet 3  . glucose blood (ACCU-CHEK AVIVA PLUS) test strip Test Blood sugar tid. E11.9 100 each 5  . insulin NPH Human (HUMULIN N) 100 UNIT/ML injection Inject 0.2 mLs (20 Units total) into the skin 2 (two) times daily. 10 mL 11  . isosorbide mononitrate (IMDUR) 120 MG 24 hr tablet TAKE ONE TABLET BY MOUTH ONE TIME DAILY 30 tablet 3  . lisinopril (PRINIVIL,ZESTRIL) 40 MG tablet Take 40 mg by mouth daily.    . nitroGLYCERIN (NITROSTAT) 0.4 MG SL tablet Place 1  tablet (0.4 mg total) under the tongue every 5 (five) minutes x 3 doses as needed for chest pain. 30 tablet 11   No current facility-administered medications for this visit.    Functional Status:   In your present state of health, do you have any difficulty performing the following activities: 06/02/2015 05/10/2015  Hearing? - N  Vision? - N  Difficulty concentrating or making decisions? - N  Walking or climbing stairs? - N  Dressing or bathing? - N  Doing errands, shopping? - N  Conservation officer, nature and eating ? N -  Using the Toilet? N -  In the past six months, have you accidently leaked urine? N -  Do you have problems with loss of bowel control? N -  Managing your Medications? N -  Managing your Finances? N -  Housekeeping or managing your Housekeeping? N -    Fall/Depression Screening:    PHQ 2/9 Scores 09/15/2015 06/02/2015 05/08/2015 10/30/2014 09/30/2014 03/21/2014 03/21/2014  PHQ - 2 Score 0 0 0 3 0 0 0  PHQ- 9 Score - - - 10 - - -    Assessment:  Increased edema related to decrease in furosemide to 20 mg daily.  Plan:  Talked with Dr. Sabra Heck and he advises to increase furosemide to 40 mg daily and he would like me to draw a BMET in 2 weeks.  [facility] CM Care Plan Problem One  Most Recent Value   Care Plan Problem One  High risk for falls   Role Documenting the Problem One  Care Management [place] for Problem One  Active   THN Long Term Goal (31-90 days)  Pt will use her cane and exercise caution when walking and avoid falling over the next 90 days.   Interventions for Problem One Long Term Goal  Pt reports one fall but no injury. Reinforced safety precautions.    THN CM Care Plan Problem Two        Most Recent Value   Care Plan Problem Two  Pt needs more reinforcement about her self care for disease management.   Role Documenting the Problem Two  Care Management Coordinator   Care Plan for Problem Two  Active   Interventions for Problem Two Long Term Goal    Reinforced her self management - doing great!   THN Long Term Goal (31-90) days  Pt will demonstrate her abililty to follow HF self management (daily wts, low salt diet, identify problems in timely fashion, take meds regularly) by the end of 90 days.   THN Long Term Goal Start Date  07/07/15   THN CM Short Term Goal #1 (0-30 days)  Pt will be able to confidently identify meds and their purpose in the next 30 days.   THN CM Short Term Goal #1 Start Date  07/07/15   [facility] CM Short Term Goal #1 Met Date   08/17/15   Interventions for Short Term Goal #2   rovided pt a med list with dosage, why she takes med, what time to take med, and what it looks like. I hope to enlist her grand daughter to take over her med bos filling after I close her case. Filled med box.   THN CM Short Term Goal #2 (0-30 days)  Pt will take pills 50% of the time in the next 2 weeks.   THN CM Short Term Goal #2 Start Date  08/07/15   [facility] CM Short Term Goal #2 Met Date  09/01/15 [not met, many pills still in box.]   Interventions for Short Term Goal #2  Filled med box.     Deloria Lair [facility] [phone]

## 2015-09-29 ENCOUNTER — Other Ambulatory Visit: Payer: Medicare Other

## 2015-09-29 ENCOUNTER — Other Ambulatory Visit: Payer: Self-pay | Admitting: *Deleted

## 2015-09-29 ENCOUNTER — Encounter: Payer: Self-pay | Admitting: *Deleted

## 2015-09-29 DIAGNOSIS — I5032 Chronic diastolic (congestive) heart failure: Secondary | ICD-10-CM | POA: Diagnosis not present

## 2015-09-29 NOTE — Patient Outreach (Signed)
[facility]) Care Management   09/29/2015  [name] Arth [birth year] [id number]  [name] is an 81 y.o. female  Subjective: Pt had a break in to her house early Friday morning. She heard the door slam and when she got up the door was standing wide open and her purse was gone.  She has been feeling good the last 2 weeks. Her weight has gone down a few pounds with taking furosemide 40 mg daily. She still has some edema in the L ankle from a previous fall.  Objective:   Review of Systems  Constitutional: Negative.   HENT: Negative.   Eyes: Negative.   Respiratory: Negative.   Cardiovascular: Negative.   Gastrointestinal: Negative.   Genitourinary: Negative.   Musculoskeletal: Negative.   Skin: Negative.   Neurological: Negative.   Endo/Heme/Allergies: Negative.   Psychiatric/Behavioral: Negative.    BP 130/50 mmHg  Pulse 64  Resp 18  Wt 139 lb (63.05 kg)  SpO2 98% FBS 71   Physical Exam  Constitutional: She is oriented to person, place, and time. She appears well-developed and well-nourished.  Cardiovascular: Normal rate, regular rhythm and normal heart sounds.   Respiratory: Effort normal and breath sounds normal.  GI: Soft. Bowel sounds are normal.  Musculoskeletal:  Limited ROM.  Neurological: She is alert and oriented to person, place, and time.  Skin: Skin is warm and dry.  Psychiatric: She has a normal mood and affect.    Current Medications:   Current Outpatient Prescriptions  Medication Sig Dispense Refill  . amLODipine (NORVASC) 10 MG tablet TAKE ONE TABLET BY MOUTH ONE TIME DAILY 90 tablet 1  . aspirin EC 81 MG tablet Take 81 mg by mouth daily.    . furosemide (LASIX) 40 MG tablet Take 1 tablet (40 mg total) by mouth daily. 30 tablet 3  . glucose blood (ACCU-CHEK AVIVA PLUS) test strip Test Blood sugar tid. E11.9 100 each 5  . insulin NPH Human (HUMULIN N) 100 UNIT/ML injection Inject 0.2 mLs (20 Units total) into the skin 2 (two)  times daily. 10 mL 11  . isosorbide mononitrate (IMDUR) 120 MG 24 hr tablet TAKE ONE TABLET BY MOUTH ONE TIME DAILY 30 tablet 3  . lisinopril (PRINIVIL,ZESTRIL) 40 MG tablet Take 40 mg by mouth daily.    . nitroGLYCERIN (NITROSTAT) 0.4 MG SL tablet Place 1 tablet (0.4 mg total) under the tongue every 5 (five) minutes x 3 doses as needed for chest pain. (Patient not taking: Reported on 09/29/2015) 30 tablet 11   No current facility-administered medications for this visit.    Functional Status:   In your present state of health, do you have any difficulty performing the following activities: 09/29/2015 06/02/2015  Hearing? N -  Vision? N -  Difficulty concentrating or making decisions? Y -  Walking or climbing stairs? Y -  Dressing or bathing? N -  Doing errands, shopping? N -  Preparing Food and eating ? N N  Using the Toilet? N N  In the past six months, have you accidently leaked urine? N N  Do you have problems with loss of bowel control? N N  Managing your Medications? N N  Managing your Finances? N N  Housekeeping or managing your Housekeeping? N N    Fall/Depression Screening:    PHQ 2/9 Scores 09/15/2015 06/02/2015 05/08/2015 10/30/2014 09/30/2014 03/21/2014 03/21/2014  PHQ - 2 Score 0 0 0 3 0 0 0  PHQ- 9 Score - - - 10 - - -  Assessment:  HF - stable   High risk for fall  Plan: Filled med box and drew blood for Dr. Sabra Heck.          Pt to fill her own med box in 2 weeks.  THN CM Care Plan Problem One        Most Recent Value   Care Plan Problem One  High risk for falls   Role Documenting the Problem One  Care Management [place] for Problem One  Active   THN Long Term Goal (31-90 days)  Pt will use her cane and exercise caution when walking and avoid falling over the next 90 days.   Interventions for Problem One Long Term Goal  No falls. Reinforced safety precautions.    THN CM Care Plan Problem Two        Most Recent Value   Care Plan Problem Two  Pt needs  more reinforcement about her self care for disease management.   Role Documenting the Problem Two  Care Management Coordinator   Care Plan for Problem Two  Active   Interventions for Problem Two Long Term Goal   Reinforced her self management - doing great!   THN Long Term Goal (31-90) days  Pt will demonstrate her abililty to follow HF self management (daily wts, low salt diet, identify problems in timely fashion, take meds regularly) by the end of 90 days.   THN Long Term Goal Start Date  07/07/15   THN CM Short Term Goal #1 (0-30 days)  Pt will be able to confidently identify meds and their purpose in the next 30 days.   THN CM Short Term Goal #1 Start Date  07/07/15   [facility] CM Short Term Goal #1 Met Date   08/17/15   Interventions for Short Term Goal #2   rovided pt a med list with dosage, why she takes med, what time to take med, and what it looks like. I hope to enlist her grand daughter to take over her med bos filling after I close her case. Filled med box.   THN CM Short Term Goal #2 (0-30 days)  Pt will take pills 50% of the time in the next 2 weeks.   THN CM Short Term Goal #2 Start Date  08/07/15 [Pt taking all her meds as directed.]   THN CM Short Term Goal #2 Met Date  09/29/15 [All meds taken]   Interventions for Short Term Goal #2  Filled med box.     Deloria Lair [facility] [place] [phone]

## 2015-09-30 NOTE — Progress Notes (Signed)
Lab only 

## 2015-10-01 LAB — BMP8+EGFR
BUN/Creatinine Ratio: 25 (ref 11–26)
BUN: 52 mg/dL — ABNORMAL HIGH (ref 8–27)
CALCIUM: 9.6 mg/dL (ref 8.7–10.3)
CO2: 21 mmol/L (ref 18–29)
Chloride: 104 mmol/L (ref 97–106)
Creatinine, Ser: 2.11 mg/dL — ABNORMAL HIGH (ref 0.57–1.00)
GFR calc Af Amer: 25 mL/min/{1.73_m2} — ABNORMAL LOW (ref >59)
GFR, EST NON AFRICAN AMERICAN: 21 mL/min/{1.73_m2} — AB (ref >59)
GLUCOSE: 191 mg/dL — AB (ref 65–99)
POTASSIUM: 4.7 mmol/L (ref 3.5–5.2)
SODIUM: 139 mmol/L (ref 136–144)

## 2015-10-07 ENCOUNTER — Other Ambulatory Visit: Payer: Self-pay | Admitting: Family Medicine

## 2015-10-08 ENCOUNTER — Other Ambulatory Visit: Payer: Self-pay | Admitting: *Deleted

## 2015-10-08 NOTE — Patient Outreach (Signed)
Called daughter, Beryle Lathe, to see if she would help to fill her mother's med box every 2 weeks. She told me to contact another family member, Veranda "Myrla Halsted, that is a CNA, to ask her to help with this. I called her and could not reach her. I will continue to try to contact her.  Deloria Lair [facility] [place] [phone]

## 2015-10-13 ENCOUNTER — Other Ambulatory Visit: Payer: Self-pay | Admitting: *Deleted

## 2015-10-14 ENCOUNTER — Encounter: Payer: Self-pay | Admitting: *Deleted

## 2015-10-14 NOTE — Patient Outreach (Signed)
[facility]) Care Management   10/14/2015  [name] 1932-05-10 [id number]  [name] is an 81 y.o. female  Subjective:   Objective:   Review of Systems  Constitutional: Negative.   HENT: Negative.   Eyes: Negative.   Respiratory: Negative.   Cardiovascular: Negative.   Gastrointestinal: Negative.   Genitourinary: Negative.   Musculoskeletal: Positive for myalgias.       R shoulder pain and limited ROM.  Skin: Negative.   Neurological: Negative.   Endo/Heme/Allergies: Bruises/bleeds easily.  Psychiatric/Behavioral: Negative.    BP 130/70 mmHg  Pulse 66  Resp 16  Wt 141 lb (63.957 kg)  SpO2 97%NFBS 160   Physical Exam  Constitutional: She is oriented to person, place, and time. She appears well-developed.  HENT:  Head: Normocephalic and atraumatic.  Cardiovascular: Normal rate and regular rhythm.   Murmur heard. No edema now that pt is taking 40 mg of furosemide daily.  Respiratory: Effort normal and breath sounds normal.  GI: Soft. Bowel sounds are normal.  Musculoskeletal:  R shoulder pain and limited ROM.  Neurological: She is alert and oriented to person, place, and time.  Skin: Skin is warm and dry.  Psychiatric: She has a normal mood and affect.    Current Medications:   Current Outpatient Prescriptions  Medication Sig Dispense Refill  . amLODipine (NORVASC) 10 MG tablet TAKE ONE TABLET BY MOUTH ONE TIME DAILY 90 tablet 1  . aspirin EC 81 MG tablet Take 81 mg by mouth daily.    . furosemide (LASIX) 40 MG tablet Take 1 tablet (40 mg total) by mouth daily. 30 tablet 3  . insulin NPH Human (HUMULIN N) 100 UNIT/ML injection Inject 0.2 mLs (20 Units total) into the skin 2 (two) times daily. 10 mL 11  . isosorbide mononitrate (IMDUR) 120 MG 24 hr tablet TAKE ONE TABLET BY MOUTH ONE TIME DAILY 30 tablet 4  . lisinopril (PRINIVIL,ZESTRIL) 40 MG tablet Take 40 mg by mouth daily.    Marland Kitchen glucose blood (ACCU-CHEK AVIVA PLUS) test strip  Test Blood sugar tid. E11.9 100 each 5  . nitroGLYCERIN (NITROSTAT) 0.4 MG SL tablet Place 1 tablet (0.4 mg total) under the tongue every 5 (five) minutes x 3 doses as needed for chest pain. (Patient not taking: Reported on 09/29/2015) 30 tablet 11   No current facility-administered medications for this visit.    Functional Status:   In your present state of health, do you have any difficulty performing the following activities: 09/29/2015 06/02/2015  Hearing? N -  Vision? N -  Difficulty concentrating or making decisions? Y -  Walking or climbing stairs? Y -  Dressing or bathing? N -  Doing errands, shopping? N -  Preparing Food and eating ? N N  Using the Toilet? N N  In the past six months, have you accidently leaked urine? N N  Do you have problems with loss of bowel control? N N  Managing your Medications? N N  Managing your Finances? N N  Housekeeping or managing your Housekeeping? N N    Fall/Depression Screening:    PHQ 2/9 Scores 09/15/2015 06/02/2015 05/08/2015 10/30/2014 09/30/2014 03/21/2014 03/21/2014  PHQ - 2 Score 0 0 0 3 0 0 0  PHQ- 9 Score - - - 10 - - -    Assessment:  HF - well managed  Plan: Daughter, Iline Oven, to assist pt with filling her med box every 2 weeks.  I am closing pt case today, all her goals are met. I will call her in a month to ensure she is                 doing fine. THN CM Care Plan Problem One        Most Recent Value   Care Plan Problem One  High risk for falls   Role Documenting the Problem One  Care Management [place] for Problem One  Active   THN Long Term Goal (31-90 days)  Pt will use her cane and exercise caution when walking and avoid falling over the next 90 days.   THN Long Term Goal Start Date  10/13/15   Interventions for Problem One Long Term Goal  No falls. Reinforced safety precautions.    THN CM Care Plan Problem Two        Most Recent Value   Care Plan Problem Two  Pt needs more reinforcement  about her self care for disease management.   Role Documenting the Problem Two  Care Management [place] for Problem Two  Active   Interventions for Problem Two Long Term Goal   Met with daughter/pt to set up med box and instruct on ongoing med box refill needs.    THN Long Term Goal (31-90) days  Pt will demonstrate her abililty to follow HF self management (daily wts, low salt diet, identify problems in timely fashion, take meds regularly) by the end of 90 days.   THN Long Term Goal Start Date  07/07/15   THN Long Term Goal Met Date  10/13/15   THN CM Short Term Goal #1 (0-30 days)  Pt will be able to confidently identify meds and their purpose in the next 30 days.   THN CM Short Term Goal #1 Start Date  07/07/15   [facility] CM Short Term Goal #1 Met Date   08/17/15   Interventions for Short Term Goal #2   rovided pt a med list with dosage, why she takes med, what time to take med, and what it looks like. I hope to enlist her grand daughter to take over her med bos filling after I close her case. Filled med box.   THN CM Short Term Goal #2 (0-30 days)  Pt will take pills 50% of the time in the next 2 weeks.   THN CM Short Term Goal #2 Start Date  08/07/15 [Pt taking all her meds as directed.]   THN CM Short Term Goal #2 Met Date  09/29/15 [All meds taken]   Interventions for Short Term Goal #2  Filled med box.     Deloria Lair [facility] [place] [phone]

## 2015-11-04 ENCOUNTER — Telehealth: Payer: Self-pay | Admitting: Family Medicine

## 2015-11-04 NOTE — Telephone Encounter (Signed)
denied °

## 2015-11-10 ENCOUNTER — Encounter: Payer: Self-pay | Admitting: Family Medicine

## 2015-11-10 ENCOUNTER — Ambulatory Visit (INDEPENDENT_AMBULATORY_CARE_PROVIDER_SITE_OTHER): Payer: Medicare Other | Admitting: Family Medicine

## 2015-11-10 VITALS — BP 157/58 | HR 80 | Temp 97.1°F | Ht 64.0 in | Wt 145.0 lb

## 2015-11-10 DIAGNOSIS — M25511 Pain in right shoulder: Secondary | ICD-10-CM | POA: Diagnosis not present

## 2015-11-10 DIAGNOSIS — M47812 Spondylosis without myelopathy or radiculopathy, cervical region: Secondary | ICD-10-CM

## 2015-11-10 DIAGNOSIS — M4692 Unspecified inflammatory spondylopathy, cervical region: Secondary | ICD-10-CM

## 2015-11-10 NOTE — Progress Notes (Signed)
   Subjective:    Patient ID: [name], female    DOB: 02-Oct-1932, 81 y.o.   MRN: [id number]  HPI 81 year old female who thinks she has some arthritis. Symptoms are not in her hands or fingers but in her neck going down both arms. She has some difficulty sleeping at nighttime but sleeps on 2 pillows.    Review of Systems  Constitutional: Negative.   Respiratory: Negative.   Cardiovascular: Negative.   Musculoskeletal: Positive for arthralgias.      BP 157/58 mmHg  Pulse 80  Temp(Src) 97.1 F (36.2 C) (Oral)  Ht 5\' 4"  (1.626 m)  Wt 145 lb (65.772 kg)  BMI 24.88 kg/m2  Objective:   Physical Exam  Constitutional: She appears well-developed.  Musculoskeletal:  C-spine is tender to palpation. Upper extremities showed decreased strength in the right and pain in the shoulder with abduction. There was a tender area over the deltoid insertion in the right shoulder that was injected with Depo-Medrol and Marcaine today          Assessment & Plan:  1. Cervical spine arthritis (HCC) Reluctant to use anti-inflammatory given her diabetes. I did inject her shoulder today which seems to be a focus of a lot of her symptoms. If she does not get some relief from this injection consider use of medicine like tramadol for arthritis  Wardell Honour MD

## 2016-01-07 ENCOUNTER — Telehealth: Payer: Self-pay | Admitting: Family Medicine

## 2016-01-07 NOTE — Telephone Encounter (Signed)
Pt given appt with Dr.Bradshaw tomorrow at 2:55.

## 2016-01-07 NOTE — Telephone Encounter (Signed)
The patient should come in and see a provider. She has diabetes. She needs to get an x-ray of the shoulder and treatment can be determined after this is obtained.

## 2016-01-08 ENCOUNTER — Encounter: Payer: Self-pay | Admitting: Family Medicine

## 2016-01-08 ENCOUNTER — Ambulatory Visit (INDEPENDENT_AMBULATORY_CARE_PROVIDER_SITE_OTHER): Payer: Medicare Other | Admitting: Family Medicine

## 2016-01-08 ENCOUNTER — Ambulatory Visit (INDEPENDENT_AMBULATORY_CARE_PROVIDER_SITE_OTHER): Payer: Medicare Other

## 2016-01-08 VITALS — BP 139/63 | HR 75 | Temp 97.4°F | Ht 64.0 in | Wt 146.0 lb

## 2016-01-08 DIAGNOSIS — M542 Cervicalgia: Secondary | ICD-10-CM

## 2016-01-08 DIAGNOSIS — E1122 Type 2 diabetes mellitus with diabetic chronic kidney disease: Secondary | ICD-10-CM

## 2016-01-08 DIAGNOSIS — N184 Chronic kidney disease, stage 4 (severe): Secondary | ICD-10-CM | POA: Diagnosis not present

## 2016-01-08 DIAGNOSIS — Z794 Long term (current) use of insulin: Secondary | ICD-10-CM

## 2016-01-08 LAB — POCT GLYCOSYLATED HEMOGLOBIN (HGB A1C): Hemoglobin A1C: 6.9

## 2016-01-08 MED ORDER — TRAMADOL HCL 50 MG PO TABS
25.0000 mg | ORAL_TABLET | Freq: Three times a day (TID) | ORAL | Status: DC | PRN
Start: 2016-01-08 — End: 2016-02-24

## 2016-01-08 NOTE — Patient Instructions (Addendum)
Great to see you today!  I have ordered basic labs, f/u in 3-4 weeks to discuss diabetes  I have given you tramadol for pain.

## 2016-01-08 NOTE — Progress Notes (Addendum)
   HPI  Patient presents today here today for follow-up neck pain.  Patient finds that her last 3 months she's had gradually worsening neck pain. She notes difficulty lifting her right arm due to shoulder pain and increased neck pain. She describes it as midline neck pain with paraspinal neck pain as well radiating down both arms. She had a cortisone injection of her shoulder which did not help. Subjective weakness in R arm Numbness and tingling X 1 episode of RLE Some tingling in BL arms  R shoulder pain limiting lifting over her head  She is also requesting lab work, she'll follow-up with her PCP in 6 days.  PMH: Smoking status noted ROS: Per HPI  Objective: BP 139/63 mmHg  Pulse 75  Temp(Src) 97.4 F (36.3 C) (Oral)  Ht '5\' 4"'$  (1.626 m)  Wt 146 lb (66.225 kg)  BMI 25.05 kg/m2 Gen: NAD, alert, cooperative with exam HEENT: NCAT CV: RRR, good P3/A2, 5-6/7 systolic murmur Resp: CTABL, no wheezes, non-labored Ext: No edema, warm Neuro: Alert and oriented, No gross deficits  Cervical film shows reduced discspace at C6-C7 and C4-C5, widespread sclerosis   Assessment and plan:  # Neck pain 3 months of continuous neck pain X-ray today shows Trial of tramadol for pain. Consider MRI, however I'm not sure what had a surgical candidate she would be Following up with PCP in 6 days.  Basic labs  F/u 3-4 weeks for DM2  Orders Placed This Encounter  Procedures  . DG Cervical Spine Complete    Standing Status: Future     Number of Occurrences: 1     Standing Expiration Date: 03/07/2017    Order Specific Question:  Reason for Exam (SYMPTOM  OR DIAGNOSIS REQUIRED)    Answer:  neck pain, 3 months R sided radiculopathy    Order Specific Question:  Preferred imaging location?    Answer:  Internal  . CMP14+EGFR  . CBC with Differential/Platelet  . POCT glycosylated hemoglobin (Hb A1C)    Meds ordered this encounter  Medications  . traMADol (ULTRAM) 50 MG tablet    Sig:  Take 0.5-1 tablets (25-50 mg total) by mouth every 8 (eight) hours as needed.    Dispense:  30 tablet    Refill:  [place], MD [facility] 01/08/2016, 2:51 PM

## 2016-01-08 NOTE — Addendum Note (Signed)
Addended by: Timmothy Euler on: 01/08/2016 03:48 PM   Modules accepted: Miquel Dunn

## 2016-01-09 LAB — CMP14+EGFR
ALK PHOS: 52 IU/L (ref 39–117)
ALT: 6 IU/L (ref 0–32)
AST: 8 IU/L (ref 0–40)
Albumin/Globulin Ratio: 2 (ref 1.1–2.5)
Albumin: 4.3 g/dL (ref 3.5–4.7)
BUN/Creatinine Ratio: 27 — ABNORMAL HIGH (ref 11–26)
BUN: 63 mg/dL — ABNORMAL HIGH (ref 8–27)
Bilirubin Total: <0.2 mg/dL (ref 0.0–1.2)
CALCIUM: 9.8 mg/dL (ref 8.7–10.3)
CO2: 19 mmol/L (ref 18–29)
CREATININE: 2.36 mg/dL — AB (ref 0.57–1.00)
Chloride: 103 mmol/L (ref 96–106)
GFR calc Af Amer: 22 mL/min/{1.73_m2} — ABNORMAL LOW (ref >59)
GFR calc non Af Amer: 19 mL/min/{1.73_m2} — ABNORMAL LOW (ref >59)
GLOBULIN, TOTAL: 2.2 g/dL (ref 1.5–4.5)
GLUCOSE: 234 mg/dL — AB (ref 65–99)
Potassium: 5.2 mmol/L (ref 3.5–5.2)
SODIUM: 142 mmol/L (ref 134–144)
Total Protein: 6.5 g/dL (ref 6.0–8.5)

## 2016-01-09 LAB — CBC WITH DIFFERENTIAL/PLATELET
Basophils Absolute: 0 10*3/uL (ref 0.0–0.2)
Basos: 1 %
EOS (ABSOLUTE): 0.1 10*3/uL (ref 0.0–0.4)
EOS: 2 %
HEMATOCRIT: 28.7 % — AB (ref 34.0–46.6)
Hemoglobin: 9.6 g/dL — ABNORMAL LOW (ref 11.1–15.9)
Immature Grans (Abs): 0 10*3/uL (ref 0.0–0.1)
Immature Granulocytes: 1 %
LYMPHS ABS: 1.1 10*3/uL (ref 0.7–3.1)
Lymphs: 26 %
MCH: 30.2 pg (ref 26.6–33.0)
MCHC: 33.4 g/dL (ref 31.5–35.7)
MCV: 90 fL (ref 79–97)
Monocytes Absolute: 0.3 10*3/uL (ref 0.1–0.9)
Monocytes: 7 %
Neutrophils Absolute: 2.7 10*3/uL (ref 1.4–7.0)
Neutrophils: 63 %
Platelets: 232 10*3/uL (ref 150–379)
RBC: 3.18 x10E6/uL — ABNORMAL LOW (ref 3.77–5.28)
RDW: 13.8 % (ref 12.3–15.4)
WBC: 4.3 10*3/uL (ref 3.4–10.8)

## 2016-01-14 ENCOUNTER — Ambulatory Visit: Payer: Medicare Other | Admitting: Family Medicine

## 2016-01-27 ENCOUNTER — Other Ambulatory Visit: Payer: Self-pay | Admitting: Family Medicine

## 2016-01-28 ENCOUNTER — Encounter: Payer: Self-pay | Admitting: Family Medicine

## 2016-01-28 ENCOUNTER — Ambulatory Visit (INDEPENDENT_AMBULATORY_CARE_PROVIDER_SITE_OTHER): Payer: Medicare Other

## 2016-01-28 ENCOUNTER — Ambulatory Visit (INDEPENDENT_AMBULATORY_CARE_PROVIDER_SITE_OTHER): Payer: Medicare Other | Admitting: Family Medicine

## 2016-01-28 VITALS — BP 153/59 | HR 79 | Temp 98.0°F | Ht 64.0 in | Wt 145.2 lb

## 2016-01-28 DIAGNOSIS — M25512 Pain in left shoulder: Secondary | ICD-10-CM | POA: Insufficient documentation

## 2016-01-28 DIAGNOSIS — N184 Chronic kidney disease, stage 4 (severe): Secondary | ICD-10-CM

## 2016-01-28 DIAGNOSIS — M25511 Pain in right shoulder: Secondary | ICD-10-CM

## 2016-01-28 DIAGNOSIS — I10 Essential (primary) hypertension: Secondary | ICD-10-CM

## 2016-01-28 MED ORDER — LISINOPRIL 40 MG PO TABS: 40.0000 mg | ORAL_TABLET | Freq: Every day | ORAL | Status: DC

## 2016-01-28 NOTE — Patient Instructions (Signed)
Great to see you!  You will be called by physical therapy to work on your shoulder

## 2016-01-28 NOTE — Progress Notes (Signed)
   HPI  Patient presents today here for follow-up shoulder pain and fatigue.  Shoulder pain Patient explains that about 2 weeks ago she tripped over her right foot, she attributes this to a previous stroke, and fell in her right shoulder on some cabinets. She denies any head trauma this time. Since that time she's had difficulty lifting her right arm over her head. She's not able to curl her hair. Tramadol is helping the pain.  Over the last month or so she's had worsening fatigue. She is worried that she needs another transfusion, she states that over the last 2 years she's had one transfusion a year about this time.  PMH: Smoking status noted ROS: Per HPI  Objective: BP 153/59 mmHg  Pulse 79  Temp(Src) 98 F (36.7 C) (Oral)  Ht 5\' 4"  (1.626 m)  Wt 145 lb 3.2 oz (65.862 kg)  BMI 24.91 kg/m2 Gen: NAD, alert, cooperative with exam HEENT: NCAT CV: RRR, good S1/S2, no murmur Resp: CTABL, no wheezes, non-labored Ext: No edema, warm Neuro: Alert and oriented, strength 5/5 and left lower extremity, 4/5 in right lower extremity, sensation intact in all 4 extremities, strength 5/5 and left grip, 4/5 and right grip Musculoskeletal positive Hawkins sign, limited range of motion to abduction, cannot abduct arm over 90 Also unable to passively abduct over 90 No tenderness to palpation of bony landmarks    DG shoulder- no acute findings  Assessment and plan:  # Shoulder pain X-ray today - there is no bony abnormality, positive Hawkins sign making me think she has a rotator cuff syndrome At her age I doubt that she is a good surgical candidate We'll try conservative therapy for now with tramadol in physical therapy Follow-up one month  # Fatigue Also has chronic kidney disease stage IV, her last hemoglobin was stable over the last 3 months Repeat CBC and CMP   CKD stage 4 Needs new nephrologist, hers is leaving town Labs Referral written   Orders Placed This Encounter    Procedures  . DG Shoulder Right    Standing Status: Future     Number of Occurrences:      Standing Expiration Date: 03/29/2017    Order Specific Question:  Reason for Exam (SYMPTOM  OR DIAGNOSIS REQUIRED)    Answer:  shoulder pain after fall    Order Specific Question:  Preferred imaging location?    Answer:  Internal  . Ambulatory referral to Physical Therapy    Referral Priority:  Routine    Referral Type:  Physical Medicine    Referral Reason:  Specialty Services Required    Requested Specialty:  Physical Therapy    Number of Visits Requested:  1  . Ambulatory referral to Nephrology    Referral Priority:  Routine    Referral Type:  Consultation    Referral Reason:  Specialty Services Required    Requested Specialty:  Nephrology    Number of Visits Requested:  1    Meds ordered this encounter  Medications  . furosemide (LASIX) 40 MG tablet    Sig: Take 40 mg by mouth.  Marland Kitchen lisinopril (PRINIVIL,ZESTRIL) 40 MG tablet    Sig: Take 1 tablet (40 mg total) by mouth daily.    Dispense:  90 tablet    Refill:  [place], MD [place] 01/28/2016, 2:22 PM

## 2016-01-29 ENCOUNTER — Telehealth: Payer: Self-pay | Admitting: Family Medicine

## 2016-01-29 LAB — CMP14+EGFR
ALBUMIN: 4.3 g/dL (ref 3.5–4.7)
ALT: 6 IU/L (ref 0–32)
AST: 6 IU/L (ref 0–40)
Albumin/Globulin Ratio: 1.9 (ref 1.1–2.5)
Alkaline Phosphatase: 66 IU/L (ref 39–117)
BUN / CREAT RATIO: 20 (ref 11–26)
BUN: 57 mg/dL — AB (ref 8–27)
Bilirubin Total: 0.2 mg/dL (ref 0.0–1.2)
CALCIUM: 9.9 mg/dL (ref 8.7–10.3)
CO2: 22 mmol/L (ref 18–29)
CREATININE: 2.8 mg/dL — AB (ref 0.57–1.00)
Chloride: 96 mmol/L (ref 96–106)
GFR calc non Af Amer: 15 mL/min/{1.73_m2} — ABNORMAL LOW (ref >59)
GFR, EST AFRICAN AMERICAN: 18 mL/min/{1.73_m2} — AB (ref >59)
GLUCOSE: 392 mg/dL — AB (ref 65–99)
Globulin, Total: 2.3 g/dL (ref 1.5–4.5)
Potassium: 4.9 mmol/L (ref 3.5–5.2)
Sodium: 137 mmol/L (ref 134–144)
TOTAL PROTEIN: 6.6 g/dL (ref 6.0–8.5)

## 2016-01-29 LAB — CBC WITH DIFFERENTIAL/PLATELET
BASOS ABS: 0 10*3/uL (ref 0.0–0.2)
Basos: 0 %
EOS (ABSOLUTE): 0.1 10*3/uL (ref 0.0–0.4)
EOS: 2 %
HEMATOCRIT: 30.4 % — AB (ref 34.0–46.6)
HEMOGLOBIN: 9.9 g/dL — AB (ref 11.1–15.9)
IMMATURE GRANS (ABS): 0 10*3/uL (ref 0.0–0.1)
Immature Granulocytes: 0 %
LYMPHS ABS: 1.1 10*3/uL (ref 0.7–3.1)
LYMPHS: 18 %
MCH: 29.9 pg (ref 26.6–33.0)
MCHC: 32.6 g/dL (ref 31.5–35.7)
MCV: 92 fL (ref 79–97)
MONOCYTES: 10 %
Monocytes Absolute: 0.6 10*3/uL (ref 0.1–0.9)
NEUTROS ABS: 4.2 10*3/uL (ref 1.4–7.0)
Neutrophils: 70 %
Platelets: 218 10*3/uL (ref 150–379)
RBC: 3.31 x10E6/uL — AB (ref 3.77–5.28)
RDW: 13.5 % (ref 12.3–15.4)
WBC: 6 10*3/uL (ref 3.4–10.8)

## 2016-01-29 NOTE — Telephone Encounter (Signed)
Reviewed labs and pt given appt with Tammy 3/9 at 3:00 to discuss diet.

## 2016-02-04 ENCOUNTER — Ambulatory Visit: Payer: Medicare Other | Admitting: Pharmacist

## 2016-02-05 ENCOUNTER — Encounter: Payer: Self-pay | Admitting: Family Medicine

## 2016-02-08 ENCOUNTER — Ambulatory Visit: Payer: Medicare Other | Admitting: Physical Therapy

## 2016-02-24 ENCOUNTER — Encounter: Payer: Self-pay | Admitting: Family Medicine

## 2016-02-24 ENCOUNTER — Ambulatory Visit (INDEPENDENT_AMBULATORY_CARE_PROVIDER_SITE_OTHER): Payer: Medicare Other | Admitting: Family Medicine

## 2016-02-24 VITALS — BP 139/72 | HR 75 | Temp 99.6°F | Ht 64.0 in | Wt 145.6 lb

## 2016-02-24 DIAGNOSIS — M25552 Pain in left hip: Secondary | ICD-10-CM

## 2016-02-24 DIAGNOSIS — R079 Chest pain, unspecified: Secondary | ICD-10-CM | POA: Diagnosis not present

## 2016-02-24 MED ORDER — NITROGLYCERIN 0.4 MG SL SUBL: 0.4000 mg | SUBLINGUAL_TABLET | SUBLINGUAL | Status: DC | PRN

## 2016-02-24 MED ORDER — METOPROLOL SUCCINATE ER 25 MG PO TB24: 25.0000 mg | ORAL_TABLET | Freq: Every day | ORAL | Status: DC

## 2016-02-24 MED ORDER — TRAMADOL HCL 50 MG PO TABS: 25.0000 mg | ORAL_TABLET | Freq: Three times a day (TID) | ORAL | Status: DC | PRN

## 2016-02-24 NOTE — Patient Instructions (Signed)
Great to see you!  Come back in 4-6 weeks for routine  Check up  We are going to try and help you get a cardiology appointment soon   Start metoprolol 1 pill once a day  Tramadol is for your pain

## 2016-02-24 NOTE — Progress Notes (Signed)
   HPI  Patient presents today here with left hip pain and chest pain.  Chest pain Patient describes heavy-type central chest pain that came on at rest 2 nights ago. She had 2 separate episodes, each one lasting about 15 minutes Was not exertional, however it started at rest and receded by itself. She did not have nitroglycerin. She's been taking aspirin every day   Left hip pain Describes sharp left buttock/lateral hip pain constantly going on about one week. No radiation No back pain No bowel or bladder dysfunction, saddle anesthesia, or leg weakness. No injury or falls. States that tramadol helps her shoulder pain quite a bit, however she's lost the pills. She states her shoulder pain, she was seen last for this, has resolved.  PMH: Smoking status noted ROS: Per HPI  Objective: BP 139/72 mmHg  Pulse 75  Temp(Src) 99.6 F (37.6 C) (Oral)  Ht 5\' 4"  (1.626 m)  Wt 145 lb 9.6 oz (66.044 kg)  BMI 24.98 kg/m2 Gen: NAD, alert, cooperative with exam HEENT: NCAT CV: RRR, good [id number], loud systolic murmur Resp: CTABL, no wheezes, non-labored Ext: No edema, warm Neuro: Alert and oriented, No gross deficits  Msk:  Right tenderness to palpation over the left greater trochanter and left piriformis area of her left buttock, no pain with Corky Sox test of the left hip  Assessment and plan:  # Lateral hip pain No injury, likely bursitis versus piriformis syndrome, however she does not have prominent sciatica symptoms. Tramadol helped well last time, given 20 pills  # Chest pain Currently resolved Likely angina came on at rest and resolved in 15 minutes, I have given her many cautions about this if it returns. Treat medically Refill nitroglycerin Add metoprolol Recommended cardiology follow-up within 1 week, I offered to call to arrange the appointment, however the patient states that she can do this.    Meds ordered this encounter  Medications  . nitroGLYCERIN (NITROSTAT) 0.4  MG SL tablet    Sig: Place 1 tablet (0.4 mg total) under the tongue every 5 (five) minutes x 3 doses as needed for chest pain.    Dispense:  30 tablet    Refill:  11  . traMADol (ULTRAM) 50 MG tablet    Sig: Take 0.5-1 tablets (25-50 mg total) by mouth every 8 (eight) hours as needed.    Dispense:  20 tablet    Refill:  0  . metoprolol succinate (TOPROL-XL) 25 MG 24 hr tablet    Sig: Take 1 tablet (25 mg total) by mouth daily.    Dispense:  90 tablet    Refill:  [place], MD [facility] 02/24/2016, 4:58 PM

## 2016-02-28 DIAGNOSIS — E7800 Pure hypercholesterolemia, unspecified: Secondary | ICD-10-CM | POA: Diagnosis not present

## 2016-02-28 DIAGNOSIS — E119 Type 2 diabetes mellitus without complications: Secondary | ICD-10-CM | POA: Diagnosis not present

## 2016-02-28 DIAGNOSIS — I10 Essential (primary) hypertension: Secondary | ICD-10-CM | POA: Diagnosis not present

## 2016-02-28 DIAGNOSIS — K219 Gastro-esophageal reflux disease without esophagitis: Secondary | ICD-10-CM | POA: Diagnosis not present

## 2016-02-28 DIAGNOSIS — I69951 Hemiplegia and hemiparesis following unspecified cerebrovascular disease affecting right dominant side: Secondary | ICD-10-CM | POA: Diagnosis not present

## 2016-02-28 DIAGNOSIS — Z79899 Other long term (current) drug therapy: Secondary | ICD-10-CM | POA: Diagnosis not present

## 2016-02-28 DIAGNOSIS — M25552 Pain in left hip: Secondary | ICD-10-CM | POA: Diagnosis not present

## 2016-02-28 DIAGNOSIS — M1612 Unilateral primary osteoarthritis, left hip: Secondary | ICD-10-CM | POA: Diagnosis not present

## 2016-02-28 DIAGNOSIS — Z794 Long term (current) use of insulin: Secondary | ICD-10-CM | POA: Diagnosis not present

## 2016-02-28 DIAGNOSIS — Z7982 Long term (current) use of aspirin: Secondary | ICD-10-CM | POA: Diagnosis not present

## 2016-02-28 DIAGNOSIS — I519 Heart disease, unspecified: Secondary | ICD-10-CM | POA: Diagnosis not present

## 2016-02-28 DIAGNOSIS — Z955 Presence of coronary angioplasty implant and graft: Secondary | ICD-10-CM | POA: Diagnosis not present

## 2016-03-01 ENCOUNTER — Telehealth: Payer: Self-pay | Admitting: Family Medicine

## 2016-03-01 ENCOUNTER — Encounter: Payer: Self-pay | Admitting: Family Medicine

## 2016-03-01 ENCOUNTER — Ambulatory Visit (INDEPENDENT_AMBULATORY_CARE_PROVIDER_SITE_OTHER): Payer: Medicare Other | Admitting: Family Medicine

## 2016-03-01 VITALS — BP 135/63 | HR 67 | Temp 98.2°F | Ht 64.0 in | Wt 146.4 lb

## 2016-03-01 DIAGNOSIS — M791 Myalgia: Secondary | ICD-10-CM

## 2016-03-01 DIAGNOSIS — M545 Low back pain: Secondary | ICD-10-CM

## 2016-03-01 DIAGNOSIS — M7918 Myalgia, other site: Secondary | ICD-10-CM

## 2016-03-01 MED ORDER — METHYLPREDNISOLONE ACETATE 80 MG/ML IJ SUSP
80.0000 mg | Freq: Once | INTRAMUSCULAR | Status: AC
  Administered 2016-03-01: 80 mg via INTRAMUSCULAR

## 2016-03-01 NOTE — Telephone Encounter (Signed)
She would need to be seen for that and at her age it is not a good long term treatment for pain.   Laroy Apple, MD [facility] 03/01/2016, 9:22 AM

## 2016-03-01 NOTE — Telephone Encounter (Signed)
Patient aware and appt made to see Dr. [name] today 4/4 at 4:25pm

## 2016-03-01 NOTE — Telephone Encounter (Signed)
Returned patient's phone call.  Patient states that she was given tramadol on 3/29 and every time she took medication she "got sick to her stomach."  Patient went to the ER with pain and vomiting and was given Hydrocodone 50mg .  Patient states that this medication does not make her feel "sick to her stomach but does make her feel dizzy"

## 2016-03-01 NOTE — Progress Notes (Signed)
   HPI  Patient presents today Here with lateral hip and low back pain  She explains that the tramadol made her sick tpo the stomach, her pain was so severe that she went to the ED on Sunday morning due to pain  Describes L lateral hip and L upper buttock pain for 4 months Severe dull non radiating pain worse with walking.  ED gave her a hip xray print out and #5, 5 mg hydrocodone and stated she had severe hip OA and needed a hip replacement  No Injury No groin pain   PMH: Smoking status noted ROS: Per HPI  Objective: BP 135/63 mmHg  Pulse 67  Temp(Src) 98.2 F (36.8 C) (Oral)  Ht 5\' 4"  (1.626 m)  Wt 146 lb 6.4 oz (66.407 kg)  BMI 25.12 kg/m2 Gen: NAD, alert, cooperative with exam HEENT: NCAT CV: RRR, loud systolic murmur Resp: CTABL, no wheezes, non-labored Ext: No edema, warm Neuro: Alert and oriented, No gross deficits  MSK:  No Groin pain or reproduction of pain with FABER of L leg No tenderness over the L greater troch Pain with palp just lateral to the L SI joint  Assessment and plan:  # L buttock pain Noot likely L Hip OA, although radiographically it is present Discussed no narcotics for chronic problems at her age Initially offered L greater troch bursa injection but she did not have any tenderness over the trochanter when she was in position for the injection IM depo medrol given instead RTC in 2 weeks for f/u, plan films and referral if needed  Diabetes is well controlled DC tramadol   Laroy Apple, MD [facility] 03/01/2016, 4:50 PM

## 2016-03-01 NOTE — Patient Instructions (Addendum)
Great to see you!  Lets see you again in 2 weeks, We can discuss additional x rays and referrals if you are still hurting like you are currently.   Try to keep moving, try heat applied to the area for 15 minutes a few times aday

## 2016-03-07 ENCOUNTER — Other Ambulatory Visit: Payer: Medicare Other

## 2016-03-07 ENCOUNTER — Other Ambulatory Visit: Payer: Self-pay | Admitting: *Deleted

## 2016-03-07 DIAGNOSIS — N183 Chronic kidney disease, stage 3 (moderate): Secondary | ICD-10-CM | POA: Diagnosis not present

## 2016-03-07 DIAGNOSIS — D649 Anemia, unspecified: Secondary | ICD-10-CM | POA: Diagnosis not present

## 2016-03-07 DIAGNOSIS — E559 Vitamin D deficiency, unspecified: Secondary | ICD-10-CM | POA: Diagnosis not present

## 2016-03-07 DIAGNOSIS — I10 Essential (primary) hypertension: Secondary | ICD-10-CM | POA: Diagnosis not present

## 2016-03-07 DIAGNOSIS — R809 Proteinuria, unspecified: Secondary | ICD-10-CM | POA: Diagnosis not present

## 2016-03-07 DIAGNOSIS — Z79899 Other long term (current) drug therapy: Secondary | ICD-10-CM | POA: Diagnosis not present

## 2016-03-07 MED ORDER — AMLODIPINE BESYLATE 10 MG PO TABS: 10.0000 mg | ORAL_TABLET | Freq: Every day | ORAL | Status: DC

## 2016-03-09 DIAGNOSIS — N184 Chronic kidney disease, stage 4 (severe): Secondary | ICD-10-CM | POA: Diagnosis not present

## 2016-03-09 DIAGNOSIS — E875 Hyperkalemia: Secondary | ICD-10-CM | POA: Diagnosis not present

## 2016-03-09 DIAGNOSIS — E1129 Type 2 diabetes mellitus with other diabetic kidney complication: Secondary | ICD-10-CM | POA: Diagnosis not present

## 2016-03-09 DIAGNOSIS — I10 Essential (primary) hypertension: Secondary | ICD-10-CM | POA: Diagnosis not present

## 2016-03-10 ENCOUNTER — Telehealth: Payer: Self-pay

## 2016-03-10 NOTE — Telephone Encounter (Signed)
Can I take Aleve for hip pain since I have diabetes?

## 2016-03-14 ENCOUNTER — Ambulatory Visit: Payer: Medicare Other | Admitting: Family Medicine

## 2016-03-16 ENCOUNTER — Encounter: Payer: Self-pay | Admitting: Family Medicine

## 2016-03-19 NOTE — Telephone Encounter (Signed)
Several attempts have been made to contact patient this encounter will be closed.  

## 2016-03-22 ENCOUNTER — Ambulatory Visit (INDEPENDENT_AMBULATORY_CARE_PROVIDER_SITE_OTHER): Payer: Medicare Other | Admitting: Family Medicine

## 2016-03-22 ENCOUNTER — Ambulatory Visit (INDEPENDENT_AMBULATORY_CARE_PROVIDER_SITE_OTHER): Payer: Medicare Other

## 2016-03-22 ENCOUNTER — Encounter: Payer: Self-pay | Admitting: Family Medicine

## 2016-03-22 ENCOUNTER — Encounter (INDEPENDENT_AMBULATORY_CARE_PROVIDER_SITE_OTHER): Payer: Self-pay

## 2016-03-22 VITALS — BP 164/53 | HR 67 | Temp 97.1°F | Ht 64.0 in | Wt 142.0 lb

## 2016-03-22 DIAGNOSIS — Y92009 Unspecified place in unspecified non-institutional (private) residence as the place of occurrence of the external cause: Principal | ICD-10-CM

## 2016-03-22 DIAGNOSIS — W01198A Fall on same level from slipping, tripping and stumbling with subsequent striking against other object, initial encounter: Secondary | ICD-10-CM | POA: Diagnosis not present

## 2016-03-22 DIAGNOSIS — Y92099 Unspecified place in other non-institutional residence as the place of occurrence of the external cause: Secondary | ICD-10-CM | POA: Diagnosis not present

## 2016-03-22 DIAGNOSIS — M6281 Muscle weakness (generalized): Secondary | ICD-10-CM | POA: Diagnosis not present

## 2016-03-22 DIAGNOSIS — W19XXXA Unspecified fall, initial encounter: Secondary | ICD-10-CM | POA: Diagnosis not present

## 2016-03-22 DIAGNOSIS — M25511 Pain in right shoulder: Secondary | ICD-10-CM | POA: Diagnosis not present

## 2016-03-22 DIAGNOSIS — R262 Difficulty in walking, not elsewhere classified: Secondary | ICD-10-CM | POA: Diagnosis not present

## 2016-03-22 DIAGNOSIS — R296 Repeated falls: Secondary | ICD-10-CM | POA: Diagnosis not present

## 2016-03-22 NOTE — Progress Notes (Signed)
HPI  Patient presents today here for follow-up hip pain and recent fall.  Hip pain is resolved after steroid injection.  Patient explains that Saturday, 3 days ago, she was outside planting flowers when she fell without hurting herself. She states that a police office her drug by and saw her and helped her back up. She had no problems after this.  Later that night she tripped over her own feet, by her description, she landed on her right shoulder inside of the wood floor. She had severe right shoulder pain after that and has had persistent pain since that time. She has pain turning the steering wheel in her car, she has been able to wash and bathe herself and dress herself without issues since that time.  She has not been using anything for pain relief. She has not tried ice she does not have any bruising the shoulder but does have some bruises on her right arm  PMH: Smoking status noted ROS: Per HPI  Objective: BP 164/53 mmHg  Pulse 67  Temp(Src) 97.1 F (36.2 C) (Oral)  Ht 5\' 4"  (1.626 m)  Wt 142 lb (64.411 kg)  BMI 24.36 kg/m2 Gen: NAD, alert, cooperative with exam HEENT: NCAT CV: RRR, good S1/S2, no murmur Resp: CTABL, no wheezes, non-labored Ext: No edema, warm Neuro: Alert and oriented, No gross deficits  Musculoskeletal: Limited range of motion of bilateral shoulders, right worse than left, flexion only to about 120, abduction to about the same area. She has anterior shoulder pain with the empty can test, her Luan Pulling test is normal. She has some bruising of her right forearm proximally 10 cm x 4 cm bruise on her right forearm, she describes tenderness to palpation of her right triceps area but no bruising in that area   DG right shoulder No acute findings  Assessment and plan:  # Shoulder pain Likely rotator cuff syndrome, she has limited range of motion due to pain. Still able to perform her usual ADLs. Recommending home health PT for safety evaluation the house  as well as rehabilitation for the shoulder. X-ray looks to have no acute findings today Return to clinic in 6-8 weeks. discussed Tylenol for pain     Orders Placed This Encounter  Procedures  . DG Shoulder Right    Standing Status: Future     Number of Occurrences: 1     Standing Expiration Date: 05/22/2017    Order Specific Question:  Reason for Exam (SYMPTOM  OR DIAGNOSIS REQUIRED)    Answer:  pain after fall    Order Specific Question:  Preferred imaging location?    Answer:  External  . Home Health    Order Specific Question:  To provide the following care/treatments    Answer:  PT  . Face-to-face encounter (required for Medicare/Medicaid patients)    I Kenn File certify that this patient is under my care and that I, or a nurse practitioner or physician's assistant working with me, had a face-to-face encounter that meets the physician face-to-face encounter requirements with this patient on 03/22/2016. The encounter with the patient was in whole, or in part for the following medical condition(s) which is the primary reason for home health care (List medical condition): recent fall X 2., R shoulder pain, Diastolic heart failure, DM2    Order Specific Question:  The encounter with the patient was in whole, or in part, for the following medical condition, which is the primary reason for home health care  Answer:  recent fall X 2., R shoulder pain, Diastolic heart failure, DM2    Order Specific Question:  I certify that, based on my findings, the following services are medically necessary home health services    Answer:  Physical therapy    Order Specific Question:  Reason for Medically Necessary Home Health Services    Answer:  Therapy- Home Adaptation to Facilitate Safety    Order Specific Question:  My clinical findings support the need for the above services    Answer:  Pain interferes with ambulation/mobility    Order Specific Question:  Further, I certify that my clinical  findings support that this patient is homebound due to:    Answer:  Pain interferes with ambulation/mobility    No orders of the defined types were placed in this encounter.    Laroy Apple, MD [facility] 03/22/2016, 2:11 PM

## 2016-03-22 NOTE — Patient Instructions (Signed)
Great to see you!  Try tylenol, 1 pill up to 3 times a day for pain  I am asking home health to come give you some physical therapy at home.

## 2016-03-25 DIAGNOSIS — R296 Repeated falls: Secondary | ICD-10-CM | POA: Diagnosis not present

## 2016-03-25 DIAGNOSIS — R262 Difficulty in walking, not elsewhere classified: Secondary | ICD-10-CM | POA: Diagnosis not present

## 2016-03-25 DIAGNOSIS — M6281 Muscle weakness (generalized): Secondary | ICD-10-CM | POA: Diagnosis not present

## 2016-03-29 DIAGNOSIS — M6281 Muscle weakness (generalized): Secondary | ICD-10-CM | POA: Diagnosis not present

## 2016-03-29 DIAGNOSIS — R296 Repeated falls: Secondary | ICD-10-CM | POA: Diagnosis not present

## 2016-03-29 DIAGNOSIS — R262 Difficulty in walking, not elsewhere classified: Secondary | ICD-10-CM | POA: Diagnosis not present

## 2016-03-31 DIAGNOSIS — R296 Repeated falls: Secondary | ICD-10-CM | POA: Diagnosis not present

## 2016-03-31 DIAGNOSIS — M6281 Muscle weakness (generalized): Secondary | ICD-10-CM | POA: Diagnosis not present

## 2016-03-31 DIAGNOSIS — R262 Difficulty in walking, not elsewhere classified: Secondary | ICD-10-CM | POA: Diagnosis not present

## 2016-04-04 DIAGNOSIS — R262 Difficulty in walking, not elsewhere classified: Secondary | ICD-10-CM | POA: Diagnosis not present

## 2016-04-04 DIAGNOSIS — R296 Repeated falls: Secondary | ICD-10-CM | POA: Diagnosis not present

## 2016-04-04 DIAGNOSIS — M6281 Muscle weakness (generalized): Secondary | ICD-10-CM | POA: Diagnosis not present

## 2016-04-07 DIAGNOSIS — M6281 Muscle weakness (generalized): Secondary | ICD-10-CM | POA: Diagnosis not present

## 2016-04-07 DIAGNOSIS — R262 Difficulty in walking, not elsewhere classified: Secondary | ICD-10-CM | POA: Diagnosis not present

## 2016-04-07 DIAGNOSIS — R296 Repeated falls: Secondary | ICD-10-CM | POA: Diagnosis not present

## 2016-04-12 DIAGNOSIS — M6281 Muscle weakness (generalized): Secondary | ICD-10-CM | POA: Diagnosis not present

## 2016-04-12 DIAGNOSIS — R262 Difficulty in walking, not elsewhere classified: Secondary | ICD-10-CM | POA: Diagnosis not present

## 2016-04-12 DIAGNOSIS — R296 Repeated falls: Secondary | ICD-10-CM | POA: Diagnosis not present

## 2016-04-14 DIAGNOSIS — R262 Difficulty in walking, not elsewhere classified: Secondary | ICD-10-CM | POA: Diagnosis not present

## 2016-04-14 DIAGNOSIS — M6281 Muscle weakness (generalized): Secondary | ICD-10-CM | POA: Diagnosis not present

## 2016-04-14 DIAGNOSIS — R296 Repeated falls: Secondary | ICD-10-CM | POA: Diagnosis not present

## 2016-04-27 ENCOUNTER — Ambulatory Visit (INDEPENDENT_AMBULATORY_CARE_PROVIDER_SITE_OTHER): Payer: Medicare Other | Admitting: Family Medicine

## 2016-04-27 DIAGNOSIS — R262 Difficulty in walking, not elsewhere classified: Secondary | ICD-10-CM | POA: Diagnosis not present

## 2016-04-27 DIAGNOSIS — M6281 Muscle weakness (generalized): Secondary | ICD-10-CM | POA: Diagnosis not present

## 2016-04-27 DIAGNOSIS — R296 Repeated falls: Secondary | ICD-10-CM

## 2016-05-11 ENCOUNTER — Other Ambulatory Visit: Payer: Self-pay | Admitting: Family Medicine

## 2016-05-11 ENCOUNTER — Encounter: Payer: Self-pay | Admitting: Family Medicine

## 2016-05-11 ENCOUNTER — Ambulatory Visit (INDEPENDENT_AMBULATORY_CARE_PROVIDER_SITE_OTHER): Payer: Medicare Other | Admitting: Family Medicine

## 2016-05-11 VITALS — BP 151/58 | HR 72 | Temp 98.3°F | Ht 64.0 in | Wt 149.4 lb

## 2016-05-11 DIAGNOSIS — M4726 Other spondylosis with radiculopathy, lumbar region: Secondary | ICD-10-CM

## 2016-05-11 DIAGNOSIS — M159 Polyosteoarthritis, unspecified: Secondary | ICD-10-CM

## 2016-05-11 DIAGNOSIS — M150 Primary generalized (osteo)arthritis: Secondary | ICD-10-CM

## 2016-05-11 MED ORDER — DULOXETINE HCL 30 MG PO CPEP: 30.0000 mg | ORAL_CAPSULE | Freq: Every day | ORAL | Status: DC

## 2016-05-11 NOTE — Progress Notes (Signed)
Subjective:  Patient ID: [name], female    DOB: 02/04/1932  Age: 82 y.o. MRN: [id number]  CC: Back Pain   HPI [name] presents for Ongoing problems with low back pain. Her pain is moderately severe. She is frustrated because the pain goes down her left leg and she was told that it was coming from her hip or her back. Additionally she's had x-rays recently of her neck and shoulder and those are still painful. Pain is worse with all activities. It gets better with rest overnight. She needs something to give her relief.   History [name] has a past medical history of CAD (coronary artery disease) (2003); CVA (cerebral vascular accident) ([place]) (1996); IDDM (insulin dependent diabetes mellitus) ([place]); CKD (chronic kidney disease); Congestive heart failure ([place]); Hypercholesteremia; Hypertension; Dementia; and DJD (degenerative joint disease) of lumbar spine.   She has past surgical history that includes Appendectomy and Knee surgery.   Her family history includes Breast cancer in her daughter; Diabetes in her brother and sister; Drug abuse in her brother; Heart attack (age of onset: 73) in her father; Heart attack (age of onset: 15) in her mother; Liver cancer in her brother.She reports that she has never smoked. She has never used smokeless tobacco. She reports that she does not drink alcohol or use illicit drugs.    ROS Review of Systems  Constitutional: Negative for fever, activity change and appetite change.  HENT: Negative for congestion, rhinorrhea and sore throat.   Eyes: Negative for visual disturbance.  Respiratory: Negative for cough and shortness of breath.   Cardiovascular: Negative for chest pain and palpitations.  Gastrointestinal: Negative for nausea, abdominal pain and diarrhea.  Genitourinary: Negative for dysuria.  Musculoskeletal: Positive for myalgias, back pain, arthralgias and neck pain.    Objective:  BP 151/58 mmHg  Pulse 72  Temp(Src) 98.3 F  (36.8 C) (Oral)  Ht 5\' 4"  (1.626 m)  Wt 149 lb 6.4 oz (67.767 kg)  BMI 25.63 kg/m2  SpO2 98%  BP Readings from Last 3 Encounters:  05/11/16 151/58  03/22/16 164/53  03/01/16 135/63    Wt Readings from Last 3 Encounters:  05/11/16 149 lb 6.4 oz (67.767 kg)  03/22/16 142 lb (64.411 kg)  03/01/16 146 lb 6.4 oz (66.407 kg)     Physical Exam  Constitutional: She is oriented to person, place, and time. She appears well-developed and well-nourished.  HENT:  Head: Normocephalic.  Cardiovascular: Normal rate and regular rhythm.   No murmur heard. Pulmonary/Chest: Effort normal and breath sounds normal.  Musculoskeletal: She exhibits tenderness (posterior neck, left lower  back and left lateral hip at greater trochanter.).  Neurological: She is alert and oriented to person, place, and time.       Assessment & Plan:   [name] was seen today for back pain.  Diagnoses and all orders for this visit:  Osteoarthritis of spine with radiculopathy, lumbar region  Primary osteoarthritis involving multiple joints  Other orders -     DULoxetine (CYMBALTA) 30 MG capsule; Take 1 capsule (30 mg total) by mouth daily. For one week then two daily for arthritis pain.. Take with a full stomach at suppertime      I am having Ms. Cindric start on DULoxetine. I am also having her maintain her aspirin EC, insulin NPH Human, isosorbide mononitrate, ACCU-CHEK AVIVA PLUS, furosemide, lisinopril, nitroGLYCERIN, metoprolol succinate, and amLODipine.  Meds ordered this encounter  Medications  . DULoxetine (CYMBALTA) 30 MG capsule  Sig: Take 1 capsule (30 mg total) by mouth daily. For one week then two daily for arthritis pain.. Take with a full stomach at suppertime    Dispense:  60 capsule    Refill:  2     Follow-up: Return in about 6 weeks (around 06/22/2016), or with [name].  Claretta Fraise, M.D.

## 2016-07-03 DIAGNOSIS — I130 Hypertensive heart and chronic kidney disease with heart failure and stage 1 through stage 4 chronic kidney disease, or unspecified chronic kidney disease: Secondary | ICD-10-CM | POA: Diagnosis not present

## 2016-07-03 DIAGNOSIS — Z7982 Long term (current) use of aspirin: Secondary | ICD-10-CM | POA: Diagnosis not present

## 2016-07-03 DIAGNOSIS — Z79899 Other long term (current) drug therapy: Secondary | ICD-10-CM | POA: Diagnosis not present

## 2016-07-03 DIAGNOSIS — I5032 Chronic diastolic (congestive) heart failure: Secondary | ICD-10-CM | POA: Diagnosis not present

## 2016-07-03 DIAGNOSIS — E108 Type 1 diabetes mellitus with unspecified complications: Secondary | ICD-10-CM | POA: Diagnosis not present

## 2016-07-03 DIAGNOSIS — Z794 Long term (current) use of insulin: Secondary | ICD-10-CM | POA: Diagnosis not present

## 2016-07-03 DIAGNOSIS — E1122 Type 2 diabetes mellitus with diabetic chronic kidney disease: Secondary | ICD-10-CM | POA: Diagnosis not present

## 2016-07-03 DIAGNOSIS — I110 Hypertensive heart disease with heart failure: Secondary | ICD-10-CM | POA: Diagnosis not present

## 2016-07-03 DIAGNOSIS — I509 Heart failure, unspecified: Secondary | ICD-10-CM | POA: Diagnosis not present

## 2016-07-03 DIAGNOSIS — R531 Weakness: Secondary | ICD-10-CM | POA: Diagnosis not present

## 2016-07-03 DIAGNOSIS — D649 Anemia, unspecified: Secondary | ICD-10-CM | POA: Diagnosis not present

## 2016-07-03 DIAGNOSIS — R079 Chest pain, unspecified: Secondary | ICD-10-CM | POA: Diagnosis not present

## 2016-07-03 DIAGNOSIS — R7989 Other specified abnormal findings of blood chemistry: Secondary | ICD-10-CM | POA: Diagnosis not present

## 2016-07-03 DIAGNOSIS — N189 Chronic kidney disease, unspecified: Secondary | ICD-10-CM | POA: Diagnosis not present

## 2016-07-03 DIAGNOSIS — I200 Unstable angina: Secondary | ICD-10-CM | POA: Diagnosis not present

## 2016-07-03 DIAGNOSIS — I2510 Atherosclerotic heart disease of native coronary artery without angina pectoris: Secondary | ICD-10-CM | POA: Diagnosis not present

## 2016-07-03 DIAGNOSIS — R404 Transient alteration of awareness: Secondary | ICD-10-CM | POA: Diagnosis not present

## 2016-07-06 ENCOUNTER — Other Ambulatory Visit: Payer: Self-pay | Admitting: Physician Assistant

## 2016-07-07 ENCOUNTER — Other Ambulatory Visit: Payer: Self-pay | Admitting: *Deleted

## 2016-07-08 ENCOUNTER — Telehealth: Payer: Self-pay | Admitting: Family Medicine

## 2016-07-08 ENCOUNTER — Other Ambulatory Visit: Payer: Self-pay | Admitting: *Deleted

## 2016-07-08 MED ORDER — INSULIN NPH (HUMAN) (ISOPHANE) 100 UNIT/ML ~~LOC~~ SUSP: 20.0000 [IU] | Freq: Two times a day (BID) | SUBCUTANEOUS | 5 refills | Status: DC

## 2016-07-11 ENCOUNTER — Other Ambulatory Visit: Payer: Medicare Other

## 2016-07-11 DIAGNOSIS — N183 Chronic kidney disease, stage 3 (moderate): Secondary | ICD-10-CM | POA: Diagnosis not present

## 2016-07-11 DIAGNOSIS — Z79899 Other long term (current) drug therapy: Secondary | ICD-10-CM | POA: Diagnosis not present

## 2016-07-11 DIAGNOSIS — E559 Vitamin D deficiency, unspecified: Secondary | ICD-10-CM | POA: Diagnosis not present

## 2016-07-11 DIAGNOSIS — D509 Iron deficiency anemia, unspecified: Secondary | ICD-10-CM | POA: Diagnosis not present

## 2016-07-11 DIAGNOSIS — R809 Proteinuria, unspecified: Secondary | ICD-10-CM | POA: Diagnosis not present

## 2016-07-13 DIAGNOSIS — N250 Renal osteodystrophy: Secondary | ICD-10-CM | POA: Diagnosis not present

## 2016-07-13 DIAGNOSIS — N184 Chronic kidney disease, stage 4 (severe): Secondary | ICD-10-CM | POA: Diagnosis not present

## 2016-07-13 DIAGNOSIS — I10 Essential (primary) hypertension: Secondary | ICD-10-CM | POA: Diagnosis not present

## 2016-07-13 DIAGNOSIS — R809 Proteinuria, unspecified: Secondary | ICD-10-CM | POA: Diagnosis not present

## 2016-07-14 ENCOUNTER — Ambulatory Visit (INDEPENDENT_AMBULATORY_CARE_PROVIDER_SITE_OTHER): Payer: Medicare Other | Admitting: Family Medicine

## 2016-07-14 ENCOUNTER — Encounter: Payer: Self-pay | Admitting: Family Medicine

## 2016-07-14 VITALS — BP 196/66 | HR 72 | Temp 97.4°F | Ht 64.0 in | Wt 151.0 lb

## 2016-07-14 DIAGNOSIS — E1165 Type 2 diabetes mellitus with hyperglycemia: Secondary | ICD-10-CM | POA: Diagnosis not present

## 2016-07-14 DIAGNOSIS — Z794 Long term (current) use of insulin: Secondary | ICD-10-CM | POA: Diagnosis not present

## 2016-07-14 DIAGNOSIS — IMO0002 Reserved for concepts with insufficient information to code with codable children: Secondary | ICD-10-CM

## 2016-07-14 DIAGNOSIS — D649 Anemia, unspecified: Secondary | ICD-10-CM

## 2016-07-14 DIAGNOSIS — I10 Essential (primary) hypertension: Secondary | ICD-10-CM | POA: Diagnosis not present

## 2016-07-14 DIAGNOSIS — N183 Chronic kidney disease, stage 3 (moderate): Secondary | ICD-10-CM | POA: Diagnosis not present

## 2016-07-14 DIAGNOSIS — E1122 Type 2 diabetes mellitus with diabetic chronic kidney disease: Secondary | ICD-10-CM

## 2016-07-14 LAB — BAYER DCA HB A1C WAIVED: HB A1C (BAYER DCA - WAIVED): 6.7 % (ref <7.0)

## 2016-07-14 MED ORDER — METOPROLOL SUCCINATE ER 50 MG PO TB24: 50.0000 mg | ORAL_TABLET | Freq: Every day | ORAL | 3 refills | Status: DC

## 2016-07-14 NOTE — Progress Notes (Signed)
   HPI  Patient presents today here for hospital follow-up  She explains that on August 12 she had chest pain that would not relieve with nitroglycerin, she went to the hospital via ambulance and was given 2 units of blood. Since that time she's felt much better and has not had any chest pain. She is breathing normally.  Patient also notes that her energy and arthritis has improved since the transfusion.  Hypertension Checks at home, average blood pressure is 170-180 over 70s No chest pain, as above  Type 2 diabetes Patient is using 15-18 units of NPH twice daily She does have some low blood sugars measured early in the morning, she states that occasionally it as low as 40s and 50s She recovers with a sweet snack, she does not have symptoms. Average blood sugar in the morning is 101 130.     PMH: Smoking status noted ROS: Per HPI  Objective: BP (!) 185/70   Pulse 72   Temp 97.4 F (36.3 C) (Oral)   Ht 5\' 4"  (1.626 m)   Wt 151 lb (68.5 kg)   BMI 25.92 kg/m  Gen: NAD, alert, cooperative with exam HEENT: NCAT CV: RRR, good S1/S2, no murmur Resp: CTABL, no wheezes, non-labored Ext: No edema, warm Neuro: Alert and oriented, No gross deficits  Skin: 5 cm healing skin tear on the right arm with heme crusting  Assessment and plan:  # Normocytic anemia Status post transfusion, rechecking labs Likely anemia of chronic renal disease, the hospital has recommended that she get a colonoscopy, however she states that she does not want one, denies any blood in her stool Recommended FOBT  # Hypertension Uncontrolled Increase metoprolol,  continue lisinopril, amlodipine, Lasix, Imdur  # Type 2 diabetes A1c today, may be slightly skewed due to recent transfusion She has had hypoglycemia lately, no symptoms Recommended decreasing NPH dose to 10-15 units rather than 15-20 Consider changing to tresiba  Chest pain has resolved since anemia has been addressed with transfusion,  consider erythropoietin   Laroy Apple, MD [facility] 07/14/2016, 3:07 PM

## 2016-07-14 NOTE — Patient Instructions (Signed)
Great to see you!  I have increased your metoprolol to 50 mg, 1 pill once  Day  We will cal with your labs within 1 week

## 2016-07-15 LAB — CBC WITH DIFFERENTIAL/PLATELET
BASOS ABS: 0 10*3/uL (ref 0.0–0.2)
Basos: 1 %
EOS (ABSOLUTE): 0.1 10*3/uL (ref 0.0–0.4)
Eos: 3 %
HEMOGLOBIN: 11.3 g/dL (ref 11.1–15.9)
Hematocrit: 34.2 % (ref 34.0–46.6)
IMMATURE GRANS (ABS): 0 10*3/uL (ref 0.0–0.1)
Immature Granulocytes: 0 %
LYMPHS: 31 %
Lymphocytes Absolute: 1.3 10*3/uL (ref 0.7–3.1)
MCH: 29.4 pg (ref 26.6–33.0)
MCHC: 33 g/dL (ref 31.5–35.7)
MCV: 89 fL (ref 79–97)
MONOCYTES: 9 %
Monocytes Absolute: 0.4 10*3/uL (ref 0.1–0.9)
Neutrophils Absolute: 2.3 10*3/uL (ref 1.4–7.0)
Neutrophils: 56 %
Platelets: 246 10*3/uL (ref 150–379)
RBC: 3.85 x10E6/uL (ref 3.77–5.28)
RDW: 13.2 % (ref 12.3–15.4)
WBC: 4.1 10*3/uL (ref 3.4–10.8)

## 2016-07-15 LAB — CMP14+EGFR
ALK PHOS: 63 IU/L (ref 39–117)
ALT: 6 IU/L (ref 0–32)
AST: 12 IU/L (ref 0–40)
Albumin/Globulin Ratio: 1.9 (ref 1.2–2.2)
Albumin: 4.3 g/dL (ref 3.5–4.7)
BILIRUBIN TOTAL: 0.3 mg/dL (ref 0.0–1.2)
BUN/Creatinine Ratio: 18 (ref 12–28)
BUN: 37 mg/dL — AB (ref 8–27)
CHLORIDE: 101 mmol/L (ref 96–106)
CO2: 22 mmol/L (ref 18–29)
Calcium: 10 mg/dL (ref 8.7–10.3)
Creatinine, Ser: 2.03 mg/dL — ABNORMAL HIGH (ref 0.57–1.00)
GFR calc Af Amer: 26 mL/min/{1.73_m2} — ABNORMAL LOW (ref >59)
GFR calc non Af Amer: 22 mL/min/{1.73_m2} — ABNORMAL LOW (ref >59)
GLUCOSE: 124 mg/dL — AB (ref 65–99)
Globulin, Total: 2.3 g/dL (ref 1.5–4.5)
Potassium: 4.1 mmol/L (ref 3.5–5.2)
Sodium: 143 mmol/L (ref 134–144)
Total Protein: 6.6 g/dL (ref 6.0–8.5)

## 2016-07-20 ENCOUNTER — Encounter: Payer: Self-pay | Admitting: Pediatrics

## 2016-07-20 ENCOUNTER — Ambulatory Visit (INDEPENDENT_AMBULATORY_CARE_PROVIDER_SITE_OTHER): Payer: Medicare Other | Admitting: Pediatrics

## 2016-07-20 VITALS — BP 147/68 | HR 64 | Temp 97.8°F | Ht 64.0 in | Wt 148.0 lb

## 2016-07-20 DIAGNOSIS — N184 Chronic kidney disease, stage 4 (severe): Secondary | ICD-10-CM

## 2016-07-20 DIAGNOSIS — M47812 Spondylosis without myelopathy or radiculopathy, cervical region: Secondary | ICD-10-CM | POA: Diagnosis not present

## 2016-07-20 DIAGNOSIS — Z794 Long term (current) use of insulin: Secondary | ICD-10-CM

## 2016-07-20 DIAGNOSIS — E1165 Type 2 diabetes mellitus with hyperglycemia: Secondary | ICD-10-CM

## 2016-07-20 DIAGNOSIS — IMO0002 Reserved for concepts with insufficient information to code with codable children: Secondary | ICD-10-CM

## 2016-07-20 DIAGNOSIS — E1122 Type 2 diabetes mellitus with diabetic chronic kidney disease: Secondary | ICD-10-CM | POA: Diagnosis not present

## 2016-07-20 DIAGNOSIS — N183 Chronic kidney disease, stage 3 (moderate): Secondary | ICD-10-CM

## 2016-07-20 DIAGNOSIS — M19011 Primary osteoarthritis, right shoulder: Secondary | ICD-10-CM

## 2016-07-20 MED ORDER — LIDOCAINE 5 % EX PTCH: 1.0000 | MEDICATED_PATCH | CUTANEOUS | 0 refills | Status: DC

## 2016-07-20 NOTE — Patient Instructions (Addendum)
Gentle range of motion of R shoulder using gravity Trial of lidocaine patch Follow up with PCP

## 2016-07-20 NOTE — Progress Notes (Signed)
  Subjective:   Patient ID: [name], female    DOB: 04-29-1932, 82 y.o.   MRN: [id number] CC: Shoulder Pain and Neck Pain  HPI: [name] is a 82 y.o. female presenting for Shoulder Pain and Neck Pain  [facility] ED told her she had arthritis Stayed awake last night not able to sleep because of the pain Ongoing for at least 3 months Used OTC creams, something for arthritis   Relevant past medical, surgical, family and social history reviewed. Allergies and medications reviewed and updated. History  Smoking Status  . Never Smoker  Smokeless Tobacco  . Never Used   ROS: Per HPI   Objective:    BP (!) 147/68   Pulse 64   Temp 97.8 F (36.6 C) (Oral)   Ht 5\' 4"  (1.626 m)   Wt 148 lb (67.1 kg)   BMI 25.40 kg/m   Wt Readings from Last 3 Encounters:  07/20/16 148 lb (67.1 kg)  07/14/16 151 lb (68.5 kg)  05/11/16 149 lb 6.4 oz (67.8 kg)     Gen: NAD, alert, cooperative with exam, NCAT EYES: EOMI, no conjunctival injection, or no icterus CV: NRRR, systolic ejection murmur III/VI RUSB Resp: CTABL, no wheezes, normal WOB Abd: +BS, soft, NTND. no guarding or organomegaly Ext: No edema, warm Neuro: Alert and oriented, strength equal b/l UE and LE, coordination grossly normal MSK: TTP over [facility] joint R shoulder. Decreased ROM with int and ext rotation R shoulder Pain with shoulder ext over apprx 80 degrees active, 90-100 degrees passive No point tenderness over cervical spine  Assessment & Plan:  [name] was seen today for shoulder pain and neck pain. Limited options for pain control with CKD Pt lives alone Already says she falls at times Has DM2, on insulin, hgA1c 6.9 in 12/2015  Diagnoses and all orders for this visit:  OA (osteoarthritis) of neck -     lidocaine (LIDODERM) 5 %; Place 1 patch onto the skin daily. Remove & Discard patch within 12 hours or as directed by MD  Osteoarthritis of right shoulder, unspecified osteoarthritis type Consider steroid  injection if pt amenable Gentle ROM of shoulder  Follow up plan: 2 weeks DM2 follow up with PCP Assunta Found, MD [place]

## 2016-07-27 ENCOUNTER — Telehealth: Payer: Self-pay | Admitting: Family Medicine

## 2016-07-27 NOTE — Telephone Encounter (Signed)
Appt given for Friday per patients request

## 2016-07-29 ENCOUNTER — Ambulatory Visit (INDEPENDENT_AMBULATORY_CARE_PROVIDER_SITE_OTHER): Payer: Medicare Other | Admitting: Family Medicine

## 2016-07-29 ENCOUNTER — Encounter: Payer: Self-pay | Admitting: Family Medicine

## 2016-07-29 ENCOUNTER — Ambulatory Visit (INDEPENDENT_AMBULATORY_CARE_PROVIDER_SITE_OTHER): Payer: Medicare Other

## 2016-07-29 VITALS — BP 167/59 | HR 66 | Temp 97.4°F | Ht 64.0 in | Wt 146.6 lb

## 2016-07-29 DIAGNOSIS — M545 Low back pain, unspecified: Secondary | ICD-10-CM

## 2016-07-29 DIAGNOSIS — I10 Essential (primary) hypertension: Secondary | ICD-10-CM

## 2016-07-29 DIAGNOSIS — M542 Cervicalgia: Secondary | ICD-10-CM

## 2016-07-29 MED ORDER — DICLOFENAC SODIUM 1 % TD GEL: 4.0000 g | Freq: Four times a day (QID) | TRANSDERMAL | 2 refills | Status: DC

## 2016-07-29 NOTE — Progress Notes (Addendum)
   HPI  Patient presents today here with left-sided low back pain and right-sided neck pain.  He complains that 2 weeks of nonradiating left-sided low back pain. She has no injury. She has had intermittent pain over this area for several months. She has no history of cancer. She denies any radiation down the back or leg, she also denies any new bowel or bladder dysfunction, saddle anesthesia, or leg weakness.  Right-sided neck pain Intermittent and continued since last visit She did not fill the lidoderm patch prescribed last visit.  She has no arm or hand weakness   Says she's been using one 650 mg acetaminophen once a day with some improvement. She has not tried any topical treatments.  Hypertension Patient has been taking 60 mg of lisinopril, 40mg  + previous 20 mg pills. Also taking 75 mg of metoprolol ( 50 + 25)Has been doing this for about 2 weeks.    PMH: Smoking status noted ROS: Per HPI  Objective: BP (!) 167/59   Pulse 66   Temp 97.4 F (36.3 C) (Oral)   Ht 5\' 4"  (1.626 m)   Wt 146 lb 9.6 oz (66.5 kg)   BMI 25.16 kg/m  Gen: NAD, alert, cooperative with exam HEENT: NCAT CV: RRR, good 99991111, 3/6 systolic murmur Resp: CTABL, no wheezes, non-labored Ext: No edema, warm Neuro: Alert and oriented, strength 4/5 and symmetric in bilateral lower extremities Normal sensation bilateral lower extremities, careful but normal gait Reflexes on the L patellar tendon are diminished but strength is symmetric  MSK:   no tenderness to palpation of right or left-sided lumbar paraspinal muscles or midline lumbar area. No tenderness to palpation of the right-sided neck or spine.  X ary lumbar spine: Aortic atherosclerosis, degenerative disc disease with loss of disc space at L4-L5, and L3/L4. Also with anterior listhesis around L3.  Assessment and plan:  # Left-sided low back pain No sciatica Patient is having some improvement with 1 Tylenol daily, recommended increasing to  1 Tylenol 3 times daily Also given Voltaren gel Consider MRI if persistent. X-ray today shows severe arthritic changes   # Neck pain Stable, both helped by tyelnol Voltaren gel No arm weakness   # HTNUncontrolled today, possibly contributed to by pain. Discussed in detail which medications to take, I have drawn an excellent medications not to take today. Follow-up in 3-4 weeks Likely need to add additional medications.   Orders Placed This Encounter  Procedures  . DG Lumbar Spine 2-3 Views    Standing Status:   Future    Number of Occurrences:   1    Standing Expiration Date:   09/28/2017    Order Specific Question:   Reason for Exam (SYMPTOM  OR DIAGNOSIS REQUIRED)    Answer:   low back pain X 2 weeks    Order Specific Question:   Preferred imaging location?    Answer:   Internal    Meds ordered this encounter  Medications  . diclofenac sodium (VOLTAREN) 1 % GEL    Sig: Apply 4 g topically 4 (four) times daily.    Dispense:  100 g    Refill:  [place], MD [facility] 07/29/2016, 12:37 PM

## 2016-07-29 NOTE — Patient Instructions (Signed)
Great to see you!  Try the voltaren gel for pain  We will cal with x ray results  Come back in 1 month for blood pressure followup I would like you to take amlodipine 10 mg, lisinopril 40 mg, and metoprolol 50 mg.

## 2016-08-02 ENCOUNTER — Telehealth: Payer: Self-pay

## 2016-08-02 ENCOUNTER — Telehealth: Payer: Self-pay | Admitting: Family Medicine

## 2016-08-02 DIAGNOSIS — M19019 Primary osteoarthritis, unspecified shoulder: Secondary | ICD-10-CM | POA: Insufficient documentation

## 2016-08-02 DIAGNOSIS — M19011 Primary osteoarthritis, right shoulder: Secondary | ICD-10-CM

## 2016-08-02 NOTE — Telephone Encounter (Signed)
OI have added osteoarthritis of the shoulder to her list. It is indicated for the treatment of OA.   Laroy Apple, MD [facility] 08/02/2016, 5:45 PM

## 2016-08-04 NOTE — Telephone Encounter (Signed)
Please see other telephone call on patient

## 2016-08-10 ENCOUNTER — Ambulatory Visit (INDEPENDENT_AMBULATORY_CARE_PROVIDER_SITE_OTHER): Payer: Medicare Other | Admitting: Family Medicine

## 2016-08-10 ENCOUNTER — Encounter: Payer: Self-pay | Admitting: Family Medicine

## 2016-08-10 ENCOUNTER — Ambulatory Visit: Payer: Medicare Other | Admitting: Physician Assistant

## 2016-08-10 VITALS — BP 181/69 | HR 63 | Temp 97.8°F | Ht 64.0 in | Wt 144.5 lb

## 2016-08-10 DIAGNOSIS — I10 Essential (primary) hypertension: Secondary | ICD-10-CM | POA: Diagnosis not present

## 2016-08-10 DIAGNOSIS — R011 Cardiac murmur, unspecified: Secondary | ICD-10-CM | POA: Diagnosis not present

## 2016-08-10 DIAGNOSIS — R5383 Other fatigue: Secondary | ICD-10-CM

## 2016-08-10 NOTE — Progress Notes (Signed)
BP (!) 181/69 (BP Location: Left Arm, Cuff Size: Large)   Pulse 63   Temp 97.8 F (36.6 C) (Oral)   Ht '5\' 4"'$  (1.626 m)   Wt 144 lb 8 oz (65.5 kg)   BMI 24.80 kg/m    Subjective:    Patient ID: [name], female    DOB: 1932-07-07, 82 y.o.   MRN: [id number]  HPI: [name] is a 82 y.o. female presenting on 08/10/2016 for Labwork (patient is concerned that her hemoglobin may be low again because she has been so tired recently)   HPI Fatigue Patient has had increasing fatigue over the past couple days. She has had an issue with anemia previously and hypertension previously. Patient denies headaches, blurred vision, chest pains, shortness of breath, or weakness. Denies any side effects from medication and is content with current medication. She has had this similar fatigue previously when her blood counts were really down to 9 and then she had a blood transfusion and this was about 2-3 months ago and now she is starting to fill it again. She does have known chronic renal disease which was the thought of the cause of her anemia previously. She has seen a nephrologist. She does have elevated blood pressure today on exam as well. She also has a systolic murmur on exam but she denies any knowledge of this previously. She had an echocardiogram in June 2016 which did not show any evidence of stenosis or regurg.  Relevant past medical, surgical, family and social history reviewed and updated as indicated. Interim medical history since our last visit reviewed. Allergies and medications reviewed and updated.  Review of Systems  Constitutional: Positive for fatigue. Negative for chills and fever.  HENT: Negative for congestion, ear discharge and ear pain.   Eyes: Negative for redness and visual disturbance.  Respiratory: Negative for chest tightness and shortness of breath.   Cardiovascular: Negative for chest pain, palpitations and leg swelling.  Genitourinary: Negative for difficulty  urinating and dysuria.  Musculoskeletal: Negative for back pain and gait problem.  Skin: Negative for rash.  Neurological: Negative for dizziness, speech difficulty, weakness, light-headedness, numbness and headaches.  Psychiatric/Behavioral: Negative for agitation and behavioral problems.  All other systems reviewed and are negative.   Per HPI unless specifically indicated above     Medication List       Accurate as of 08/10/16  3:04 PM. Always use your most recent med list.          ACCU-CHEK AVIVA PLUS test strip Generic drug:  glucose blood USE TO TEST BLOOD SUGAR 3 TIMES A DAY   amLODipine 10 MG tablet Commonly known as:  NORVASC Take 1 tablet (10 mg total) by mouth daily.   diclofenac sodium 1 % Gel Commonly known as:  VOLTAREN Apply 4 g topically 4 (four) times daily.   furosemide 40 MG tablet Commonly known as:  LASIX Take 40 mg by mouth.   insulin NPH Human 100 UNIT/ML injection Commonly known as:  HUMULIN N Inject 0.2 mLs (20 Units total) into the skin 2 (two) times daily.   isosorbide mononitrate 120 MG 24 hr tablet Commonly known as:  IMDUR TAKE 1 TABLET DAILY   lisinopril 40 MG tablet Commonly known as:  PRINIVIL,ZESTRIL Take 1 tablet (40 mg total) by mouth daily.   metoprolol succinate 50 MG 24 hr tablet Commonly known as:  TOPROL-XL Take 1 tablet (50 mg total) by mouth daily.   nitroGLYCERIN 0.4 MG SL  tablet Commonly known as:  NITROSTAT Place 1 tablet (0.4 mg total) under the tongue every 5 (five) minutes x 3 doses as needed for chest pain.          Objective:    BP (!) 195/73   Pulse 63   Temp 97.8 F (36.6 C) (Oral)   Ht '5\' 4"'$  (1.626 m)   Wt 144 lb 8 oz (65.5 kg)   BMI 24.80 kg/m   Wt Readings from Last 3 Encounters:  08/10/16 144 lb 8 oz (65.5 kg)  07/29/16 146 lb 9.6 oz (66.5 kg)  07/20/16 148 lb (67.1 kg)    Physical Exam  Constitutional: She is oriented to person, place, and time. She appears well-developed and  well-nourished. No distress.  HENT:  Mouth/Throat: Oropharynx is clear and moist.  Eyes: Conjunctivae are normal.  Patient does have pallor under the eyelids  Neck: Neck supple. No thyromegaly present.  Cardiovascular: Normal rate, regular rhythm and intact distal pulses.   Murmur (Systolic 3 out of 6 in the aortic region) heard. Pulmonary/Chest: Effort normal and breath sounds normal. No respiratory distress. She has no wheezes.  Musculoskeletal: Normal range of motion. She exhibits no edema or tenderness.  Lymphadenopathy:    She has no cervical adenopathy.  Neurological: She is alert and oriented to person, place, and time. Coordination normal.  Skin: Skin is warm and dry. No rash noted. She is not diaphoretic.  Psychiatric: She has a normal mood and affect. Her behavior is normal.  Nursing note and vitals reviewed.   Results for orders placed or performed in visit on 07/14/16  Bayer DCA Hb A1c Waived  Result Value Ref Range   Bayer DCA Hb A1c Waived 6.7 <7.0 %  CMP14+EGFR  Result Value Ref Range   Glucose 124 (H) 65 - 99 mg/dL   BUN 37 (H) 8 - 27 mg/dL   Creatinine, Ser 2.03 (H) 0.57 - 1.00 mg/dL   GFR calc non Af Amer 22 (L) >59 mL/min/1.73   GFR calc Af Amer 26 (L) >59 mL/min/1.73   BUN/Creatinine Ratio 18 12 - 28   Sodium 143 134 - 144 mmol/L   Potassium 4.1 3.5 - 5.2 mmol/L   Chloride 101 96 - 106 mmol/L   CO2 22 18 - 29 mmol/L   Calcium 10.0 8.7 - 10.3 mg/dL   Total Protein 6.6 6.0 - 8.5 g/dL   Albumin 4.3 3.5 - 4.7 g/dL   Globulin, Total 2.3 1.5 - 4.5 g/dL   Albumin/Globulin Ratio 1.9 1.2 - 2.2   Bilirubin Total 0.3 0.0 - 1.2 mg/dL   Alkaline Phosphatase 63 39 - 117 IU/L   AST 12 0 - 40 IU/L   ALT 6 0 - 32 IU/L  CBC with Differential  Result Value Ref Range   WBC 4.1 3.4 - 10.8 x10E3/uL   RBC 3.85 3.77 - 5.28 x10E6/uL   Hemoglobin 11.3 11.1 - 15.9 g/dL   Hematocrit 34.2 34.0 - 46.6 %   MCV 89 79 - 97 fL   MCH 29.4 26.6 - 33.0 pg   MCHC 33.0 31.5 - 35.7  g/dL   RDW 13.2 12.3 - 15.4 %   Platelets 246 150 - 379 x10E3/uL   Neutrophils 56 %   Lymphs 31 %   Monocytes 9 %   Eos 3 %   Basos 1 %   Neutrophils Absolute 2.3 1.4 - 7.0 x10E3/uL   Lymphocytes Absolute 1.3 0.7 - 3.1 x10E3/uL   Monocytes Absolute 0.4 0.1 -  0.9 x10E3/uL   EOS (ABSOLUTE) 0.1 0.0 - 0.4 x10E3/uL   Basophils Absolute 0.0 0.0 - 0.2 x10E3/uL   Immature Granulocytes 0 %   Immature Grans (Abs) 0.0 0.0 - 0.1 x10E3/uL      Assessment & Plan:   Problem List Items Addressed This Visit      Cardiovascular and Mediastinum   Hypertension - Primary   Relevant Orders   CMP14+EGFR    Other Visit Diagnoses    Other fatigue       Relevant Orders   CBC with Differential/Platelet   CMP14+EGFR   TSH   ECHOCARDIOGRAM COMPLETE   Systolic murmur       Patient did not know of a systolic murmur previously, had an echo in June 2016 but none since   Relevant Orders   ECHOCARDIOGRAM COMPLETE       Follow up plan: Return in about 2 weeks (around 08/24/2016), or if symptoms worsen or fail to improve, for Hypertension recheck.  Counseling provided for all of the vaccine components Orders Placed This Encounter  Procedures  . CBC with Differential/Platelet  . CMP14+EGFR  . TSH  . ECHOCARDIOGRAM [place] Mazzie Brodrick, MD [facility] 08/10/2016, 3:04 PM

## 2016-08-11 LAB — CBC WITH DIFFERENTIAL/PLATELET
BASOS ABS: 0 10*3/uL (ref 0.0–0.2)
Basos: 0 %
EOS (ABSOLUTE): 0.2 10*3/uL (ref 0.0–0.4)
Eos: 4 %
Hematocrit: 32.1 % — ABNORMAL LOW (ref 34.0–46.6)
Hemoglobin: 11 g/dL — ABNORMAL LOW (ref 11.1–15.9)
IMMATURE GRANULOCYTES: 0 %
Immature Grans (Abs): 0 10*3/uL (ref 0.0–0.1)
Lymphocytes Absolute: 1.3 10*3/uL (ref 0.7–3.1)
Lymphs: 26 %
MCH: 29.5 pg (ref 26.6–33.0)
MCHC: 34.3 g/dL (ref 31.5–35.7)
MCV: 86 fL (ref 79–97)
MONOS ABS: 0.5 10*3/uL (ref 0.1–0.9)
Monocytes: 11 %
NEUTROS PCT: 59 %
Neutrophils Absolute: 3 10*3/uL (ref 1.4–7.0)
PLATELETS: 178 10*3/uL (ref 150–379)
RBC: 3.73 x10E6/uL — AB (ref 3.77–5.28)
RDW: 14 % (ref 12.3–15.4)
WBC: 5 10*3/uL (ref 3.4–10.8)

## 2016-08-11 LAB — CMP14+EGFR
A/G RATIO: 1.7 (ref 1.2–2.2)
ALT: 6 IU/L (ref 0–32)
AST: 6 IU/L (ref 0–40)
Albumin: 4.3 g/dL (ref 3.5–4.7)
Alkaline Phosphatase: 58 IU/L (ref 39–117)
BUN/Creatinine Ratio: 18 (ref 12–28)
BUN: 45 mg/dL — ABNORMAL HIGH (ref 8–27)
Bilirubin Total: 0.3 mg/dL (ref 0.0–1.2)
CALCIUM: 10.3 mg/dL (ref 8.7–10.3)
CHLORIDE: 101 mmol/L (ref 96–106)
CO2: 26 mmol/L (ref 18–29)
Creatinine, Ser: 2.54 mg/dL — ABNORMAL HIGH (ref 0.57–1.00)
GFR calc Af Amer: 20 mL/min/{1.73_m2} — ABNORMAL LOW (ref >59)
GFR, EST NON AFRICAN AMERICAN: 17 mL/min/{1.73_m2} — AB (ref >59)
GLUCOSE: 188 mg/dL — AB (ref 65–99)
Globulin, Total: 2.5 g/dL (ref 1.5–4.5)
POTASSIUM: 5 mmol/L (ref 3.5–5.2)
Sodium: 143 mmol/L (ref 134–144)
Total Protein: 6.8 g/dL (ref 6.0–8.5)

## 2016-08-11 LAB — TSH: TSH: 6.65 u[IU]/mL — ABNORMAL HIGH (ref 0.450–4.500)

## 2016-08-12 DIAGNOSIS — S76111A Strain of right quadriceps muscle, fascia and tendon, initial encounter: Secondary | ICD-10-CM | POA: Diagnosis not present

## 2016-08-12 DIAGNOSIS — M545 Low back pain: Secondary | ICD-10-CM | POA: Diagnosis not present

## 2016-08-12 DIAGNOSIS — S199XXA Unspecified injury of neck, initial encounter: Secondary | ICD-10-CM | POA: Diagnosis not present

## 2016-08-12 DIAGNOSIS — Z79899 Other long term (current) drug therapy: Secondary | ICD-10-CM | POA: Diagnosis not present

## 2016-08-12 DIAGNOSIS — S7001XA Contusion of right hip, initial encounter: Secondary | ICD-10-CM | POA: Diagnosis not present

## 2016-08-12 DIAGNOSIS — R262 Difficulty in walking, not elsewhere classified: Secondary | ICD-10-CM | POA: Diagnosis not present

## 2016-08-12 DIAGNOSIS — E1122 Type 2 diabetes mellitus with diabetic chronic kidney disease: Secondary | ICD-10-CM | POA: Diagnosis not present

## 2016-08-12 DIAGNOSIS — R404 Transient alteration of awareness: Secondary | ICD-10-CM | POA: Diagnosis not present

## 2016-08-12 DIAGNOSIS — W1839XA Other fall on same level, initial encounter: Secondary | ICD-10-CM | POA: Diagnosis not present

## 2016-08-12 DIAGNOSIS — E860 Dehydration: Secondary | ICD-10-CM | POA: Diagnosis not present

## 2016-08-12 DIAGNOSIS — R531 Weakness: Secondary | ICD-10-CM | POA: Diagnosis not present

## 2016-08-12 DIAGNOSIS — S0990XA Unspecified injury of head, initial encounter: Secondary | ICD-10-CM | POA: Diagnosis not present

## 2016-08-12 DIAGNOSIS — R079 Chest pain, unspecified: Secondary | ICD-10-CM | POA: Diagnosis not present

## 2016-08-12 DIAGNOSIS — I509 Heart failure, unspecified: Secondary | ICD-10-CM | POA: Diagnosis not present

## 2016-08-12 DIAGNOSIS — S0003XA Contusion of scalp, initial encounter: Secondary | ICD-10-CM | POA: Diagnosis not present

## 2016-08-12 DIAGNOSIS — I129 Hypertensive chronic kidney disease with stage 1 through stage 4 chronic kidney disease, or unspecified chronic kidney disease: Secondary | ICD-10-CM | POA: Diagnosis not present

## 2016-08-12 DIAGNOSIS — Z794 Long term (current) use of insulin: Secondary | ICD-10-CM | POA: Diagnosis not present

## 2016-08-12 DIAGNOSIS — M542 Cervicalgia: Secondary | ICD-10-CM | POA: Diagnosis not present

## 2016-08-12 DIAGNOSIS — Z9181 History of falling: Secondary | ICD-10-CM | POA: Diagnosis not present

## 2016-08-12 DIAGNOSIS — N184 Chronic kidney disease, stage 4 (severe): Secondary | ICD-10-CM | POA: Diagnosis not present

## 2016-08-12 DIAGNOSIS — R51 Headache: Secondary | ICD-10-CM | POA: Diagnosis not present

## 2016-08-12 DIAGNOSIS — D649 Anemia, unspecified: Secondary | ICD-10-CM | POA: Diagnosis not present

## 2016-08-13 DIAGNOSIS — S79911A Unspecified injury of right hip, initial encounter: Secondary | ICD-10-CM | POA: Diagnosis not present

## 2016-08-13 DIAGNOSIS — E860 Dehydration: Secondary | ICD-10-CM | POA: Diagnosis not present

## 2016-08-13 DIAGNOSIS — R509 Fever, unspecified: Secondary | ICD-10-CM | POA: Diagnosis not present

## 2016-08-13 DIAGNOSIS — M25551 Pain in right hip: Secondary | ICD-10-CM | POA: Diagnosis not present

## 2016-08-13 DIAGNOSIS — W19XXXA Unspecified fall, initial encounter: Secondary | ICD-10-CM | POA: Diagnosis not present

## 2016-08-14 DIAGNOSIS — I129 Hypertensive chronic kidney disease with stage 1 through stage 4 chronic kidney disease, or unspecified chronic kidney disease: Secondary | ICD-10-CM | POA: Diagnosis not present

## 2016-08-14 DIAGNOSIS — N184 Chronic kidney disease, stage 4 (severe): Secondary | ICD-10-CM | POA: Diagnosis not present

## 2016-08-14 DIAGNOSIS — W19XXXD Unspecified fall, subsequent encounter: Secondary | ICD-10-CM | POA: Diagnosis not present

## 2016-08-14 DIAGNOSIS — R509 Fever, unspecified: Secondary | ICD-10-CM | POA: Diagnosis not present

## 2016-08-14 DIAGNOSIS — E860 Dehydration: Secondary | ICD-10-CM | POA: Diagnosis not present

## 2016-08-15 DIAGNOSIS — N184 Chronic kidney disease, stage 4 (severe): Secondary | ICD-10-CM | POA: Diagnosis not present

## 2016-08-15 DIAGNOSIS — R509 Fever, unspecified: Secondary | ICD-10-CM | POA: Diagnosis not present

## 2016-08-15 DIAGNOSIS — E860 Dehydration: Secondary | ICD-10-CM | POA: Diagnosis not present

## 2016-08-15 DIAGNOSIS — W19XXXD Unspecified fall, subsequent encounter: Secondary | ICD-10-CM | POA: Diagnosis not present

## 2016-08-15 DIAGNOSIS — I129 Hypertensive chronic kidney disease with stage 1 through stage 4 chronic kidney disease, or unspecified chronic kidney disease: Secondary | ICD-10-CM | POA: Diagnosis not present

## 2016-08-19 ENCOUNTER — Ambulatory Visit (HOSPITAL_COMMUNITY): Payer: Medicare Other | Attending: Family Medicine

## 2016-08-22 ENCOUNTER — Encounter: Payer: Self-pay | Admitting: Nurse Practitioner

## 2016-08-22 ENCOUNTER — Ambulatory Visit (INDEPENDENT_AMBULATORY_CARE_PROVIDER_SITE_OTHER): Payer: Medicare Other | Admitting: Nurse Practitioner

## 2016-08-22 ENCOUNTER — Telehealth: Payer: Self-pay | Admitting: Family Medicine

## 2016-08-22 VITALS — BP 198/74 | HR 70 | Temp 98.7°F | Ht 64.0 in | Wt 145.0 lb

## 2016-08-22 DIAGNOSIS — J209 Acute bronchitis, unspecified: Secondary | ICD-10-CM

## 2016-08-22 MED ORDER — AZITHROMYCIN 500 MG PO TABS: ORAL_TABLET | ORAL | 0 refills | Status: DC

## 2016-08-22 MED ORDER — BENZONATATE 100 MG PO CAPS: 100.0000 mg | ORAL_CAPSULE | Freq: Three times a day (TID) | ORAL | 0 refills | Status: DC | PRN

## 2016-08-22 NOTE — Progress Notes (Signed)
Subjective:     [name] is a 82 y.o. female here for evaluation of a cough. Onset of symptoms was 4 days ago. Symptoms have been gradually worsening since that time. The cough is barky, dry, harsh, hoarse and nonproductive and is aggravated by nothing. Associated symptoms include: change in voice and chills. Patient does not have a history of asthma. Patient does not have a history of environmental allergens. Patient has not traveled recently. Patient does not have a history of smoking. Patient has had a previous chest x-ray. Patient has not had a PPD done.  The following portions of the patient's history were reviewed and updated as appropriate: allergies, current medications, past family history, past medical history, past social history, past surgical history and problem list.  Review of Systems Pertinent items noted in HPI and remainder of comprehensive ROS otherwise negative.    Objective:     BP (!) 198/74   Pulse 70   Temp 98.7 F (37.1 C) (Oral)   Ht 5\' 4"  (1.626 m)   Wt 145 lb (65.8 kg)   BMI 24.89 kg/m  General appearance: alert and cooperative Eyes: conjunctivae/corneas clear. PERRL, EOM's intact. Fundi benign. Ears: normal TM's and external ear canals both ears Nose: Nares normal. Septum midline. Mucosa normal. No drainage or sinus tenderness., clear discharge, mild congestion, turbinates pale Throat: lips, mucosa, and tongue normal; teeth and gums normal Neck: no adenopathy, no carotid bruit, no JVD, supple, symmetrical, trachea midline and thyroid not enlarged, symmetric, no tenderness/mass/nodules Lungs: rhonchi bibasilar and bilaterally Heart: systolic murmur: early systolic 2/6, blowing at 2nd left intercostal space    Assessment:    Acute Bronchitis    Plan:   1. Take meds as prescribed 2. Use a cool mist humidifier especially during the winter months and when heat has been humid. 3. Use saline nose sprays frequently 4. Saline irrigations of the nose  can be very helpful if done frequently.  * 4X daily for 1 week*  * Use of a nettie pot can be helpful with this. Follow directions with this* 5. Drink plenty of fluids 6. Keep thermostat turn down low 7.For any cough or congestion  Use plain Mucinex- regular strength or max strength is fine   * Children- consult with Pharmacist for dosing 8. For fever or aces or pains- take tylenol or ibuprofen appropriate for age and weight.  * for fevers greater than 101 orally you may alternate ibuprofen and tylenol every  3 hours.   Meds ordered this encounter  Medications  . azithromycin (ZITHROMAX) 500 MG tablet    Sig: As directed    Dispense:  6 tablet    Refill:  0    Order Specific Question:   Supervising Provider    Answer:   VINCENT, CAROL L [[id number]]  . benzonatate (TESSALON PERLES) 100 MG capsule    Sig: Take 1 capsule (100 mg total) by mouth 3 (three) times daily as needed for cough.    Dispense:  20 capsule    Refill:  0    Order Specific Question:   Supervising Provider    Answer:   Eustaquio Maize Lander, FNP

## 2016-08-22 NOTE — Telephone Encounter (Signed)
appt made to see MMM at 3

## 2016-08-22 NOTE — Patient Instructions (Signed)

## 2016-08-24 ENCOUNTER — Other Ambulatory Visit: Payer: Self-pay | Admitting: *Deleted

## 2016-08-24 MED ORDER — LEVOTHYROXINE SODIUM 50 MCG PO TABS: 50.0000 ug | ORAL_TABLET | Freq: Every day | ORAL | 0 refills | Status: DC

## 2016-08-25 ENCOUNTER — Ambulatory Visit: Payer: Medicare Other | Admitting: Family Medicine

## 2016-08-29 ENCOUNTER — Ambulatory Visit: Payer: Medicare Other | Admitting: Family Medicine

## 2016-09-06 ENCOUNTER — Encounter: Payer: Self-pay | Admitting: Family Medicine

## 2016-09-21 ENCOUNTER — Other Ambulatory Visit: Payer: Self-pay | Admitting: Family Medicine

## 2016-09-21 ENCOUNTER — Other Ambulatory Visit: Payer: Self-pay | Admitting: Physician Assistant

## 2016-09-26 ENCOUNTER — Other Ambulatory Visit (INDEPENDENT_AMBULATORY_CARE_PROVIDER_SITE_OTHER): Payer: Medicare Other

## 2016-09-26 DIAGNOSIS — N183 Chronic kidney disease, stage 3 (moderate): Secondary | ICD-10-CM | POA: Diagnosis not present

## 2016-09-26 DIAGNOSIS — Z79899 Other long term (current) drug therapy: Secondary | ICD-10-CM | POA: Diagnosis not present

## 2016-09-26 DIAGNOSIS — E559 Vitamin D deficiency, unspecified: Secondary | ICD-10-CM | POA: Diagnosis not present

## 2016-09-26 DIAGNOSIS — R809 Proteinuria, unspecified: Secondary | ICD-10-CM | POA: Diagnosis not present

## 2016-09-26 DIAGNOSIS — D509 Iron deficiency anemia, unspecified: Secondary | ICD-10-CM | POA: Diagnosis not present

## 2016-09-28 DIAGNOSIS — R809 Proteinuria, unspecified: Secondary | ICD-10-CM | POA: Diagnosis not present

## 2016-09-28 DIAGNOSIS — N184 Chronic kidney disease, stage 4 (severe): Secondary | ICD-10-CM | POA: Diagnosis not present

## 2016-09-28 DIAGNOSIS — I10 Essential (primary) hypertension: Secondary | ICD-10-CM | POA: Diagnosis not present

## 2016-09-28 DIAGNOSIS — N250 Renal osteodystrophy: Secondary | ICD-10-CM | POA: Diagnosis not present

## 2016-09-28 DIAGNOSIS — E1129 Type 2 diabetes mellitus with other diabetic kidney complication: Secondary | ICD-10-CM | POA: Diagnosis not present

## 2016-10-03 ENCOUNTER — Other Ambulatory Visit: Payer: Self-pay | Admitting: Family Medicine

## 2016-10-05 ENCOUNTER — Inpatient Hospital Stay (HOSPITAL_COMMUNITY)
Admission: EM | Admit: 2016-10-05 | Discharge: 2016-10-07 | DRG: 305 | Disposition: A | Discharge status: Home / Self Care | Payer: Medicare Other | Attending: Cardiology | Admitting: Cardiology

## 2016-10-05 ENCOUNTER — Encounter (HOSPITAL_COMMUNITY): Payer: Self-pay | Admitting: Emergency Medicine

## 2016-10-05 ENCOUNTER — Emergency Department (HOSPITAL_COMMUNITY): Payer: Medicare Other

## 2016-10-05 ENCOUNTER — Ambulatory Visit (INDEPENDENT_AMBULATORY_CARE_PROVIDER_SITE_OTHER): Payer: Medicare Other | Admitting: Family Medicine

## 2016-10-05 VITALS — BP 220/82 | HR 110 | Temp 97.5°F

## 2016-10-05 DIAGNOSIS — I208 Other forms of angina pectoris: Secondary | ICD-10-CM | POA: Diagnosis not present

## 2016-10-05 DIAGNOSIS — I160 Hypertensive urgency: Secondary | ICD-10-CM | POA: Diagnosis not present

## 2016-10-05 DIAGNOSIS — M47816 Spondylosis without myelopathy or radiculopathy, lumbar region: Secondary | ICD-10-CM | POA: Diagnosis present

## 2016-10-05 DIAGNOSIS — I130 Hypertensive heart and chronic kidney disease with heart failure and stage 1 through stage 4 chronic kidney disease, or unspecified chronic kidney disease: Secondary | ICD-10-CM | POA: Diagnosis not present

## 2016-10-05 DIAGNOSIS — I5032 Chronic diastolic (congestive) heart failure: Secondary | ICD-10-CM | POA: Diagnosis present

## 2016-10-05 DIAGNOSIS — Z8249 Family history of ischemic heart disease and other diseases of the circulatory system: Secondary | ICD-10-CM | POA: Diagnosis not present

## 2016-10-05 DIAGNOSIS — I679 Cerebrovascular disease, unspecified: Secondary | ICD-10-CM | POA: Diagnosis not present

## 2016-10-05 DIAGNOSIS — F0390 Unspecified dementia without behavioral disturbance: Secondary | ICD-10-CM | POA: Diagnosis present

## 2016-10-05 DIAGNOSIS — I2510 Atherosclerotic heart disease of native coronary artery without angina pectoris: Secondary | ICD-10-CM

## 2016-10-05 DIAGNOSIS — E1122 Type 2 diabetes mellitus with diabetic chronic kidney disease: Secondary | ICD-10-CM | POA: Diagnosis present

## 2016-10-05 DIAGNOSIS — E1165 Type 2 diabetes mellitus with hyperglycemia: Secondary | ICD-10-CM | POA: Diagnosis not present

## 2016-10-05 DIAGNOSIS — Z8673 Personal history of transient ischemic attack (TIA), and cerebral infarction without residual deficits: Secondary | ICD-10-CM

## 2016-10-05 DIAGNOSIS — R262 Difficulty in walking, not elsewhere classified: Secondary | ICD-10-CM

## 2016-10-05 DIAGNOSIS — I10 Essential (primary) hypertension: Secondary | ICD-10-CM | POA: Diagnosis present

## 2016-10-05 DIAGNOSIS — I110 Hypertensive heart disease with heart failure: Secondary | ICD-10-CM | POA: Diagnosis present

## 2016-10-05 DIAGNOSIS — I2089 Other forms of angina pectoris: Secondary | ICD-10-CM | POA: Diagnosis present

## 2016-10-05 DIAGNOSIS — D631 Anemia in chronic kidney disease: Secondary | ICD-10-CM | POA: Diagnosis present

## 2016-10-05 DIAGNOSIS — R079 Chest pain, unspecified: Secondary | ICD-10-CM | POA: Diagnosis not present

## 2016-10-05 DIAGNOSIS — I200 Unstable angina: Secondary | ICD-10-CM | POA: Diagnosis not present

## 2016-10-05 DIAGNOSIS — Z803 Family history of malignant neoplasm of breast: Secondary | ICD-10-CM | POA: Diagnosis not present

## 2016-10-05 DIAGNOSIS — Z833 Family history of diabetes mellitus: Secondary | ICD-10-CM

## 2016-10-05 DIAGNOSIS — I25119 Atherosclerotic heart disease of native coronary artery with unspecified angina pectoris: Secondary | ICD-10-CM | POA: Diagnosis not present

## 2016-10-05 DIAGNOSIS — E118 Type 2 diabetes mellitus with unspecified complications: Secondary | ICD-10-CM

## 2016-10-05 DIAGNOSIS — E785 Hyperlipidemia, unspecified: Secondary | ICD-10-CM | POA: Diagnosis not present

## 2016-10-05 DIAGNOSIS — Z800 Family history of malignant neoplasm of digestive organs: Secondary | ICD-10-CM

## 2016-10-05 DIAGNOSIS — I358 Other nonrheumatic aortic valve disorders: Secondary | ICD-10-CM | POA: Diagnosis not present

## 2016-10-05 DIAGNOSIS — E7800 Pure hypercholesterolemia, unspecified: Secondary | ICD-10-CM | POA: Diagnosis present

## 2016-10-05 DIAGNOSIS — N189 Chronic kidney disease, unspecified: Secondary | ICD-10-CM | POA: Diagnosis present

## 2016-10-05 DIAGNOSIS — IMO0001 Reserved for inherently not codable concepts without codable children: Secondary | ICD-10-CM

## 2016-10-05 DIAGNOSIS — N184 Chronic kidney disease, stage 4 (severe): Secondary | ICD-10-CM | POA: Diagnosis not present

## 2016-10-05 DIAGNOSIS — Z794 Long term (current) use of insulin: Secondary | ICD-10-CM | POA: Diagnosis not present

## 2016-10-05 DIAGNOSIS — Z9861 Coronary angioplasty status: Secondary | ICD-10-CM

## 2016-10-05 DIAGNOSIS — R0789 Other chest pain: Secondary | ICD-10-CM | POA: Diagnosis not present

## 2016-10-05 DIAGNOSIS — R072 Precordial pain: Secondary | ICD-10-CM | POA: Diagnosis not present

## 2016-10-05 LAB — CBC
HCT: 32 % — ABNORMAL LOW (ref 36.0–46.0)
HEMATOCRIT: 28.8 % — AB (ref 36.0–46.0)
HEMOGLOBIN: 9.5 g/dL — AB (ref 12.0–15.0)
Hemoglobin: 10.6 g/dL — ABNORMAL LOW (ref 12.0–15.0)
MCH: 29.2 pg (ref 26.0–34.0)
MCH: 29.2 pg (ref 26.0–34.0)
MCHC: 33 g/dL (ref 30.0–36.0)
MCHC: 33.1 g/dL (ref 30.0–36.0)
MCV: 88.6 fL (ref 78.0–100.0)
MCV: 88.2 fL (ref 78.0–100.0)
PLATELETS: 178 10*3/uL (ref 150–400)
Platelets: 196 10*3/uL (ref 150–400)
RBC: 3.25 MIL/uL — AB (ref 3.87–5.11)
RBC: 3.63 MIL/uL — ABNORMAL LOW (ref 3.87–5.11)
RDW: 13.5 % (ref 11.5–15.5)
RDW: 13.2 % (ref 11.5–15.5)
WBC: 2.9 10*3/uL — ABNORMAL LOW (ref 4.0–10.5)
WBC: 3.7 10*3/uL — ABNORMAL LOW (ref 4.0–10.5)

## 2016-10-05 LAB — URINALYSIS, ROUTINE W REFLEX MICROSCOPIC
Bilirubin Urine: NEGATIVE
Glucose, UA: 100 mg/dL — AB
KETONES UR: NEGATIVE mg/dL
LEUKOCYTES UA: NEGATIVE
NITRITE: NEGATIVE
Specific Gravity, Urine: 1.013 (ref 1.005–1.030)
pH: 6 (ref 5.0–8.0)

## 2016-10-05 LAB — URINE MICROSCOPIC-ADD ON

## 2016-10-05 LAB — BRAIN NATRIURETIC PEPTIDE: B Natriuretic Peptide: 411.8 pg/mL — ABNORMAL HIGH (ref 0.0–100.0)

## 2016-10-05 LAB — BASIC METABOLIC PANEL
Anion gap: 9 (ref 5–15)
BUN: 57 mg/dL — ABNORMAL HIGH (ref 6–20)
CHLORIDE: 107 mmol/L (ref 101–111)
CO2: 23 mmol/L (ref 22–32)
CREATININE: 2.64 mg/dL — AB (ref 0.44–1.00)
Calcium: 10.1 mg/dL (ref 8.9–10.3)
GFR calc non Af Amer: 16 mL/min — ABNORMAL LOW (ref >60)
GFR, EST AFRICAN AMERICAN: 18 mL/min — AB (ref >60)
Glucose, Bld: 206 mg/dL — ABNORMAL HIGH (ref 65–99)
Potassium: 4.6 mmol/L (ref 3.5–5.1)
Sodium: 139 mmol/L (ref 135–145)

## 2016-10-05 LAB — CBG MONITORING, ED: Glucose-Capillary: 261 mg/dL — ABNORMAL HIGH (ref 65–99)

## 2016-10-05 LAB — CREATININE, SERUM
Creatinine, Ser: 2.62 mg/dL — ABNORMAL HIGH (ref 0.44–1.00)
GFR calc Af Amer: 18 mL/min — ABNORMAL LOW (ref >60)
GFR calc non Af Amer: 16 mL/min — ABNORMAL LOW (ref >60)

## 2016-10-05 LAB — TSH: TSH: 0.859 u[IU]/mL (ref 0.350–4.500)

## 2016-10-05 LAB — I-STAT TROPONIN, ED
TROPONIN I, POC: 0.04 ng/mL (ref 0.00–0.08)
Troponin i, poc: 0.04 ng/mL (ref 0.00–0.08)

## 2016-10-05 LAB — GLUCOSE, CAPILLARY: Glucose-Capillary: 292 mg/dL — ABNORMAL HIGH (ref 65–99)

## 2016-10-05 LAB — PROTIME-INR
INR: 1.03
PROTHROMBIN TIME: 13.5 s (ref 11.4–15.2)

## 2016-10-05 LAB — TROPONIN I
Troponin I: 0.05 ng/mL (ref <0.03)
Troponin I: 0.14 ng/mL (ref <0.03)

## 2016-10-05 MED ORDER — AMLODIPINE BESYLATE 10 MG PO TABS
10.0000 mg | ORAL_TABLET | Freq: Every day | ORAL | Status: DC
  Administered 2016-10-05 – 2016-10-07 (×3): 10 mg via ORAL
  Filled 2016-10-05: qty 2
  Filled 2016-10-05 (×2): qty 1

## 2016-10-05 MED ORDER — INSULIN ASPART 100 UNIT/ML ~~LOC~~ SOLN
0.0000 [IU] | Freq: Every day | SUBCUTANEOUS | Status: DC
  Administered 2016-10-05: 3 [IU] via SUBCUTANEOUS

## 2016-10-05 MED ORDER — LISINOPRIL 40 MG PO TABS
40.0000 mg | ORAL_TABLET | Freq: Every day | ORAL | Status: DC
  Administered 2016-10-06 – 2016-10-07 (×2): 40 mg via ORAL
  Filled 2016-10-05 (×2): qty 1

## 2016-10-05 MED ORDER — NITROGLYCERIN IN D5W 200-5 MCG/ML-% IV SOLN
5.0000 ug/min | INTRAVENOUS | Status: DC
  Administered 2016-10-05: 5 ug/min via INTRAVENOUS
  Filled 2016-10-05: qty 250

## 2016-10-05 MED ORDER — INSULIN ASPART 100 UNIT/ML ~~LOC~~ SOLN
0.0000 [IU] | Freq: Three times a day (TID) | SUBCUTANEOUS | Status: DC
  Administered 2016-10-05: 8 [IU] via SUBCUTANEOUS
  Administered 2016-10-06 (×3): 3 [IU] via SUBCUTANEOUS
  Administered 2016-10-07: 5 [IU] via SUBCUTANEOUS
  Administered 2016-10-07: 3 [IU] via SUBCUTANEOUS
  Filled 2016-10-05: qty 1

## 2016-10-05 MED ORDER — METOPROLOL SUCCINATE ER 50 MG PO TB24
50.0000 mg | ORAL_TABLET | Freq: Every day | ORAL | Status: DC
  Administered 2016-10-06: 50 mg via ORAL
  Filled 2016-10-05 (×2): qty 1

## 2016-10-05 MED ORDER — ACETAMINOPHEN 325 MG PO TABS: 650.0000 mg | ORAL_TABLET | ORAL | Status: DC | PRN

## 2016-10-05 MED ORDER — LEVOTHYROXINE SODIUM 50 MCG PO TABS
50.0000 ug | ORAL_TABLET | Freq: Every day | ORAL | Status: DC
  Administered 2016-10-06 – 2016-10-07 (×2): 50 ug via ORAL
  Filled 2016-10-05 (×3): qty 1

## 2016-10-05 MED ORDER — ONDANSETRON HCL 4 MG/2ML IJ SOLN: 4.0000 mg | Freq: Four times a day (QID) | INTRAMUSCULAR | Status: DC | PRN

## 2016-10-05 MED ORDER — FUROSEMIDE 40 MG PO TABS
40.0000 mg | ORAL_TABLET | Freq: Every day | ORAL | Status: DC
  Administered 2016-10-06 – 2016-10-07 (×2): 40 mg via ORAL
  Filled 2016-10-05 (×2): qty 1

## 2016-10-05 MED ORDER — NITROGLYCERIN 0.4 MG SL SUBL: 0.4000 mg | SUBLINGUAL_TABLET | SUBLINGUAL | Status: DC | PRN

## 2016-10-05 MED ORDER — HEPARIN SODIUM (PORCINE) 5000 UNIT/ML IJ SOLN
5000.0000 [IU] | Freq: Three times a day (TID) | INTRAMUSCULAR | Status: DC
  Administered 2016-10-05 – 2016-10-07 (×6): 5000 [IU] via SUBCUTANEOUS
  Filled 2016-10-05 (×6): qty 1

## 2016-10-05 MED ORDER — SODIUM CHLORIDE 0.9% FLUSH: 3.0000 mL | INTRAVENOUS | Status: DC | PRN

## 2016-10-05 MED ORDER — LABETALOL HCL 5 MG/ML IV SOLN
20.0000 mg | Freq: Once | INTRAVENOUS | Status: AC
  Administered 2016-10-05: 20 mg via INTRAVENOUS
  Filled 2016-10-05: qty 4

## 2016-10-05 MED ORDER — SODIUM CHLORIDE 0.9% FLUSH
3.0000 mL | Freq: Two times a day (BID) | INTRAVENOUS | Status: DC
  Administered 2016-10-05 – 2016-10-07 (×3): 3 mL via INTRAVENOUS

## 2016-10-05 MED ORDER — SODIUM CHLORIDE 0.9 % IV SOLN: 250.0000 mL | INTRAVENOUS | Status: DC | PRN

## 2016-10-05 MED ORDER — ASPIRIN EC 81 MG PO TBEC
81.0000 mg | DELAYED_RELEASE_TABLET | Freq: Every day | ORAL | Status: DC
  Administered 2016-10-06 – 2016-10-07 (×2): 81 mg via ORAL
  Filled 2016-10-05 (×2): qty 1

## 2016-10-05 NOTE — ED Provider Notes (Signed)
[facility] DEPT Provider Note   CSN: [id number] Arrival date & time: 10/05/16  1006     History   Chief Complaint Chief Complaint  Patient presents with  . Chest Pain    HPI [name] is a 82 y.o. female.  The history is provided by the patient.  Chest Pain   This is a recurrent problem. The current episode started 2 days ago. Episode frequency: intermittently. The problem has been gradually worsening. The pain is associated with exertion and rest. The pain is present in the substernal region. The pain is at a severity of 3/10. The pain is moderate. The quality of the pain is described as burning and sharp. The pain does not radiate. Associated symptoms include shortness of breath. Pertinent negatives include no abdominal pain, no back pain, no near-syncope and no numbness. She has tried nitroglycerin for the symptoms. The treatment provided moderate relief. Risk factors include being elderly and sedentary lifestyle.  Her past medical history is significant for CAD and CHF.  Her family medical history is significant for hyperlipidemia and hypertension.  Procedure history is positive for echocardiogram.  Procedure history is negative for cardiac catheterization.    Past Medical History:  Diagnosis Date  . CAD (coronary artery disease) 2003   Last catheterization 2008. Two-vessel PCI of the first OM branch and mid LAD.  Marland Kitchen CKD (chronic kidney disease)   . Congestive heart failure ([place])   . CVA (cerebral vascular accident) ([place]) [place]   . Dementia   . DJD (degenerative joint disease) of lumbar spine   . Hypercholesteremia   . Hypertension   . IDDM (insulin dependent diabetes mellitus) [facility])     Patient Active Problem List   Diagnosis Date Noted  . 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 Pulmonary hypertension   . Diabetes type 2, uncontrolled ([place])   . Chronic kidney disease, stage IV (severe) ([place])   . Other constipation   . Failure to thrive in adult    . Hyperkalemia   . Hypomagnesemia   . Coronary atherosclerosis of native coronary artery: stress test 2013 showing inferior and distal anteroseptal ischemia 05/11/2015  . Anemia in chronic kidney disease 05/11/2015  . Angina at rest [facility]) 05/09/2015  . Rhabdomyolysis 03/21/2014  . Benign paroxysmal positional vertigo 03/21/2014  . Lumbar spondylosis 05/16/2013  . Hyponatremia 05/15/2013  . Overweight 05/15/2013  . Varicose veins of lower extremities with other complications [id number]  . Normocytic anemia 10/29/2012  . Hypoglycemia 10/29/2012  . NSVT (nonsustained ventricular tachycardia) ([place]) 10/28/2012  . OA (osteoarthritis) 10/27/2012  . Hypertension 10/26/2011  . DIASTOLIC HEART FAILURE, CHRONIC 07/20/2010  . CARDIAC MURMUR, SYSTOLIC [id number]  . Cerebrovascular disease 03/09/2010  . Hyperlipidemia 12/05/2008    Past Surgical History:  Procedure Laterality Date  . APPENDECTOMY    . KNEE SURGERY      OB History    No data available       Home Medications    Prior to Admission medications   Medication Sig Start Date End Date Taking? Authorizing Provider  ACCU-CHEK AVIVA PLUS test strip CHECK BLOOD SUGER UP TO 3 TIMES A DAY 09/21/16   Timmothy Euler, MD  amLODipine (NORVASC) 10 MG tablet Take 1 tablet (10 mg total) by mouth daily. 10/04/16   Timmothy Euler, MD  azithromycin (ZITHROMAX) 500 MG tablet As directed 08/22/16   Mary-Margaret Hassell Done, FNP  benzonatate (TESSALON PERLES) 100 MG capsule Take 1 capsule (100 mg total) by mouth 3 (  three) times daily as needed for cough. 08/22/16   Mary-Margaret Hassell Done, FNP  diclofenac sodium (VOLTAREN) 1 % GEL Apply 4 g topically 4 (four) times daily. 07/29/16   Timmothy Euler, MD  furosemide (LASIX) 40 MG tablet Take 40 mg by mouth.    Historical Provider, MD  insulin NPH Human (HUMULIN N) 100 UNIT/ML injection Inject 0.2 mLs (20 Units total) into the skin 2 (two) times daily. 07/08/16   Timmothy Euler, MD  isosorbide  mononitrate (IMDUR) 120 MG 24 hr tablet TAKE 1 TABLET DAILY 05/12/16   Timmothy Euler, MD  levothyroxine (SYNTHROID, LEVOTHROID) 50 MCG tablet Take 1 tablet (50 mcg total) by mouth daily. 08/24/16   Fransisca Kaufmann Dettinger, MD  lisinopril (PRINIVIL,ZESTRIL) 40 MG tablet Take 1 tablet (40 mg total) by mouth daily. 01/28/16   Timmothy Euler, MD  metoprolol succinate (TOPROL-XL) 50 MG 24 hr tablet Take 1 tablet (50 mg total) by mouth daily. 07/14/16   Timmothy Euler, MD  nitroGLYCERIN (NITROSTAT) 0.4 MG SL tablet Place 1 tablet (0.4 mg total) under the tongue every 5 (five) minutes x 3 doses as needed for chest pain. 02/24/16   Timmothy Euler, MD    Family History Family History  Problem Relation Age of Onset  . Heart attack Mother 20  . Heart attack Father 59  . Diabetes Brother     RETINOPATHY   . Drug abuse Brother   . Liver cancer Brother   . Breast cancer Daughter   . Diabetes Sister     Social History Social History  Substance Use Topics  . Smoking status: Never Smoker  . Smokeless tobacco: Never Used  . Alcohol use No     Allergies   Propoxyphene n-acetaminophen and Simvastatin   Review of Systems Review of Systems  Respiratory: Positive for shortness of breath.   Cardiovascular: Positive for chest pain. Negative for near-syncope.  Gastrointestinal: Negative for abdominal pain.  Musculoskeletal: Negative for back pain.  Neurological: Negative for numbness.  All other systems reviewed and are negative.    Physical Exam Updated Vital Signs SpO2 98% Comment: RA  Physical Exam  Constitutional: She is oriented to person, place, and time. She appears well-developed and well-nourished.  HENT:  Head: Normocephalic and atraumatic.  Eyes: Conjunctivae are normal. Right eye exhibits no discharge.  Neck: Neck supple.  Cardiovascular: Normal rate and regular rhythm.   Murmur heard. Pulmonary/Chest: Effort normal and breath sounds normal. She has no wheezes. She has no  rales.  Abdominal: Soft. She exhibits no distension. There is no tenderness.  Musculoskeletal: Normal range of motion. She exhibits no edema.  Neurological: She is oriented to person, place, and time. No cranial nerve deficit.  Skin: Skin is warm and dry. No rash noted. She is not diaphoretic.  Psychiatric: She has a normal mood and affect. Her behavior is normal.  Nursing note and vitals reviewed.    ED Treatments / Results  Labs (all labs ordered are listed, but only abnormal results are displayed) Labs Reviewed  BASIC METABOLIC PANEL  CBC  BRAIN NATRIURETIC PEPTIDE  URINALYSIS, [place] (NOT AT [facility])  [place], ED  [place], ED    EKG  EKG Interpretation  Date/Time:  Wednesday October 05 2016 10:18:35 EST Ventricular Rate:  71 PR Interval:    QRS Duration: 119 QT Interval:  449 QTC Calculation: 488 R Axis:   -17 Text Interpretation:  Sinus rhythm Prolonged PR interval LVH  with IVCD and secondary repol abnrm Anterior ST elevation, probably due to LVH Borderline prolonged QT interval No significant change since last tracing Confirmed by Gerald Leitz ([id number]) on 10/05/2016 10:23:34 AM       Radiology No results found.  Procedures Procedures (including critical care time)  Medications Ordered in ED Medications - No data to display   Initial Impression / Assessment and Plan / ED Course  I have reviewed the triage vital signs and the nursing notes.  Pertinent labs & imaging results that were available during my care of the patient were reviewed by me and considered in my medical decision making (see chart for details).  Clinical Course     Pt is an 82 yo . female with a history of presumed CAD (abnl stress 2013)(treated medically 2ndary to CKD IV), D-CHF, and CVA patient of Dr. Percival Spanish. Most recent echo in 2016 aortic valve calcification but no stenosis, + moderate MR.  Patient's presenting today with what  sounds like unstable angina. Patient had recent stay month ago at [facility] for similar symptoms. She reports a feeling better over the course of hospitilization and she was discharged. Patient states that since Monday she has had on and off again chest pain off and exertionally related. It is substernal, heavy. Patient had it particularly bad this morning took nitroglycerin which helped resolve it. Patient went to primary care physician or she developed the chest pain again they gave her nitroglycerin and aspirin. Patient feels improved and has no chest pain currently. Patient reports taking her medications as prescribed.  We'll get initial troponin and labs and plan to discuss with cardiology.  CRITICAL CARE Performed by: Gardiner Sleeper Total critical care time45 minutes Critical care time was exclusive of separately billable procedures and treating other patients. Critical care was necessary to treat or prevent imminent or life-threatening deterioration. Critical care was time spent personally by me on the following activities: development of treatment plan with patient and/or surrogate as well as nursing, discussions with consultants, evaluation of patient's response to treatment, examination of patient, obtaining history from patient or surrogate, ordering and performing treatments and interventions, ordering and review of laboratory studies, ordering and review of radiographic studies, pulse oximetry and re-evaluation of patient's condition.   Final Clinical Impressions(s) / ED Diagnoses   Final diagnoses:  None    New Prescriptions New Prescriptions   No medications on file     Caffie Sotto Julio Alm, MD 10/05/16 [phone]

## 2016-10-05 NOTE — H&P (Signed)
Patient ID: [name] MRN: [id number], DOB/AGE: Feb 23, 1932   Admit date: 10/05/2016   Primary Physician: Kenn File, MD Primary Cardiologist: Dr Percival Spanish  HPI: 82 y/o female, lives alone in her own home, from [place] with a history of CAD, DM, HTN, and CRI-4. The pt had an LAD POBA in '03 and an LAD DES and OM DES in '08. In Nov 2013 she was admitted with as NSTEMI but declined cath. She had done well on medical Rx, LOV Aug 2016. She was sent from [facility] to [facility] ED after she presented there with increasing chest pain "burning" and uncontrolled HTN. She admits to having pain off and on for months. She takes NTG with temporary relief. She says she has been compliant with her medications. In the ED her initial Troponin is negative. Her B/P is > [id number] systolic. Her two daughters accompanied her to the ED.    Problem List: Past Medical History:  Diagnosis Date  . CAD (coronary artery disease) 2003   Last catheterization 2008. Two-vessel PCI of the first OM branch and mid LAD.  Marland Kitchen CKD (chronic kidney disease)   . Congestive heart failure ([place])   . CVA (cerebral vascular accident) ([place]) [place]   . Dementia   . DJD (degenerative joint disease) of lumbar spine   . Hypercholesteremia   . Hypertension   . IDDM (insulin dependent diabetes mellitus) ([place])     Past Surgical History:  Procedure Laterality Date  . APPENDECTOMY    . KNEE SURGERY       Allergies:  Allergies  Allergen Reactions  . Propoxyphene N-Acetaminophen     Pt does not know reaction  . Simvastatin Other (See Comments)    headache     Home Medications Prior to Admission medications   Medication Sig Start Date End Date Taking? Authorizing Provider  ACCU-CHEK AVIVA PLUS test strip CHECK BLOOD SUGER UP TO 3 TIMES A DAY 09/21/16  Yes Timmothy Euler, MD  amLODipine (NORVASC) 10 MG tablet Take 1 tablet (10 mg total) by mouth daily. 10/04/16  Yes Timmothy Euler, MD  diclofenac sodium (VOLTAREN)  1 % GEL Apply 4 g topically 4 (four) times daily. 07/29/16  Yes Timmothy Euler, MD  furosemide (LASIX) 40 MG tablet Take 40 mg by mouth.   Yes Historical Provider, MD  insulin NPH Human (HUMULIN N) 100 UNIT/ML injection Inject 0.2 mLs (20 Units total) into the skin 2 (two) times daily. 07/08/16  Yes Timmothy Euler, MD  isosorbide mononitrate (IMDUR) 120 MG 24 hr tablet TAKE 1 TABLET DAILY 05/12/16  Yes Timmothy Euler, MD  levothyroxine (SYNTHROID, LEVOTHROID) 50 MCG tablet Take 1 tablet (50 mcg total) by mouth daily. 08/24/16  Yes Fransisca Kaufmann Dettinger, MD  lisinopril (PRINIVIL,ZESTRIL) 40 MG tablet Take 1 tablet (40 mg total) by mouth daily. 01/28/16  Yes Timmothy Euler, MD  metoprolol succinate (TOPROL-XL) 50 MG 24 hr tablet Take 1 tablet (50 mg total) by mouth daily. 07/14/16  Yes Timmothy Euler, MD  nitroGLYCERIN (NITROSTAT) 0.4 MG SL tablet Place 1 tablet (0.4 mg total) under the tongue every 5 (five) minutes x 3 doses as needed for chest pain. 02/24/16  Yes Timmothy Euler, MD  azithromycin (ZITHROMAX) 500 MG tablet As directed Patient not taking: Reported on 10/05/2016 08/22/16   Mary-Margaret Hassell Done, FNP  benzonatate (TESSALON PERLES) 100 MG capsule Take 1 capsule (100 mg total) by mouth 3 (three) times daily  as needed for cough. Patient not taking: Reported on 10/05/2016 08/22/16   Mary-Margaret Hassell Done, FNP     Family History  Problem Relation Age of Onset  . Heart attack Mother 76  . Heart attack Father 44  . Diabetes Brother     RETINOPATHY   . Drug abuse Brother   . Liver cancer Brother   . Breast cancer Daughter   . Diabetes Sister      Social History   Social History  . Marital status: Widowed    Spouse name: N/A  . Number of children: N/A  . Years of education: N/A   Occupational History  . Not on file.   Social History Main Topics  . Smoking status: Never Smoker  . Smokeless tobacco: Never Used  . Alcohol use No  . Drug use: No  . Sexual activity: No    Other Topics Concern  . Not on file   Social History Narrative   Lives alone.       Review of Systems: General: negative for chills, fever, night sweats or weight changes.  Cardiovascular: negative for dyspnea on exertion, edema, orthopnea, palpitations, paroxysmal nocturnal dyspnea or shortness of breath HEENT: negative for any visual disturbances, blindness, glaucoma Dermatological: negative for rash Respiratory: negative for cough, hemoptysis, or wheezing Urologic: negative for hematuria or dysuria Abdominal: negative for nausea, vomiting, diarrhea, bright red blood per rectum, melena, or hematemesis Neurologic: negative for visual changes, syncope, or dizziness Musculoskeletal: negative for back pain, joint pain, or swelling Psych: cooperative and appropriate All other systems reviewed and are otherwise negative except as noted above.  Physical Exam: Blood pressure (!) 232/75, pulse 93, temperature 98.1 F (36.7 C), temperature source Oral, resp. rate 15, SpO2 99 %.  General appearance: alert, cooperative, appears stated age and no distress Neck: no JVD and bilateral CA bruit, Rt > Lt Lungs: clear to auscultation bilaterally Heart: regular rate and rhythm and 2/6 systolic murmur AOV, LSB Abdomen: soft, non-tender; bowel sounds normal; no masses,  no organomegaly Extremities: No edema Pulses: 2+ and symmetric Skin: Skin color, texture, turgor normal. No rashes or lesions Neurologic: Grossly normal    Labs:   Results for orders placed or performed during the hospital encounter of 10/05/16 (from the past 24 hour(s))  Basic metabolic panel     Status: Abnormal   Collection Time: 10/05/16 10:52 AM  Result Value Ref Range   Sodium 139 135 - 145 mmol/L   Potassium 4.6 3.5 - 5.1 mmol/L   Chloride 107 101 - 111 mmol/L   CO2 23 22 - 32 mmol/L   Glucose, Bld 206 (H) 65 - 99 mg/dL   BUN 57 (H) 6 - 20 mg/dL   Creatinine, Ser 2.64 (H) 0.44 - 1.00 mg/dL   Calcium 10.1 8.9 -  10.3 mg/dL   GFR calc non Af Amer 16 (L) >60 mL/min   GFR calc Af Amer 18 (L) >60 mL/min   Anion gap 9 5 - 15  CBC     Status: Abnormal   Collection Time: 10/05/16 10:52 AM  Result Value Ref Range   WBC 2.9 (L) 4.0 - 10.5 K/uL   RBC 3.25 (L) 3.87 - 5.11 MIL/uL   Hemoglobin 9.5 (L) 12.0 - 15.0 g/dL   HCT 28.8 (L) 36.0 - 46.0 %   MCV 88.6 78.0 - 100.0 fL   MCH 29.2 26.0 - 34.0 pg   MCHC 33.0 30.0 - 36.0 g/dL   RDW 13.5 11.5 - 15.5 %  Platelets 178 150 - 400 K/uL  Brain natriuretic peptide     Status: Abnormal   Collection Time: 10/05/16 10:52 AM  Result Value Ref Range   B Natriuretic Peptide 411.8 (H) 0.0 - 100.0 pg/mL  Protime-INR     Status: None   Collection Time: 10/05/16 10:52 AM  Result Value Ref Range   Prothrombin Time 13.5 11.4 - 15.2 seconds   INR 1.03   I-stat troponin, ED     Status: None   Collection Time: 10/05/16 10:58 AM  Result Value Ref Range   Troponin i, poc 0.04 0.00 - 0.08 ng/mL   Comment 3             Radiology/Studies: Dg Chest 2 View  Result Date: 10/05/2016 CLINICAL DATA:  Midline chest pain, right arm numbness EXAM: CHEST  2 VIEW COMPARISON:  08/12/2016 FINDINGS: Cardiomediastinal silhouette is stable. Atherosclerotic calcifications of thoracic aorta again noted. Osteopenia and mild degenerative changes thoracic spine. No infiltrate or pulmonary edema. Probable chronic mild interstitial prominence. IMPRESSION: No active disease. Probable chronic mild interstitial prominence. Atherosclerotic calcifications of thoracic aorta. Electronically Signed   By: Lahoma Crocker M.D.   On: 10/05/2016 11:26    EKG:NSR, incomplete LBBB, LVH- same as June 2016  ASSESSMENT AND PLAN:  Principal Problem:   Angina at rest   Active Problems:   Uncontrolled hypertension   CAD S/P PCI '03 and '08   IDDM with complications    Chronic kidney disease, stage IV (severe)    Aortic valve sclerosis-2D June 2016   Cerebrovascular disease    Anemia in chronic kidney  disease   PLAN: Will add IV NTG, Hydralazine prn. Hesitant to add Heparin with uncontrolled HTN and anemia (though anemia appears to be chronic).    Angelena Form, PA-C 10/05/2016, 3:00 PM [phone]  History and all data above reviewed.  Patient examined.  I agree with the findings as above. The patient presents with burning chest discomfort.  She says that this has been on and off for several days.  She says that she was at [facility] for a few days.  However, I don't see this admission.  She says that she has been compliant with her meds.  However, she then says that she has been out of one of her meds.  She reports that she has been having some burning chest discomfort.  She does report that it gets better with SLNTG.  She reported to [facility] and was noted to be very hypertensive as reported above.  The patient exam reveals COR:RRR, systolic murmur, no diastolic murmurs  ,  Lungs: Clear  ,  Abd: Positive bowel sounds, no rebound no guarding, Ext No edema  .  All available labs, radiology testing, previous records reviewed. Agree with documented assessment and plan. Chest pain:  Probable angina but high risk for cath.  She has wanted conservative therapy.  I am not sure that she remembers or takes all of her meds as listed.  However, her BP is clearly difficult to control here.  She has had negative renal Doppler in the past.  I will plan IV NTG for now and consider adding a clonidine patch if she can afford it.  Cycle enzymes.  I will discuss again with her a cardiac cath but I suspect that she would decline this.    Jeneen Rinks Gabryel Talamo  4:41 PM  10/05/2016

## 2016-10-05 NOTE — ED Notes (Signed)
Dinner tray arrived 

## 2016-10-05 NOTE — ED Triage Notes (Signed)
Pt from PCP via [facility] with c/o intermittent CP x 2 months.  Pt reports taking 2 nitro and 324 mg aspirin prior to going to PCP office and denies pain at this time.  NAD, A&O.

## 2016-10-05 NOTE — ED Notes (Signed)
Report attempted 

## 2016-10-05 NOTE — ED Notes (Signed)
Pt reports 10/10 burning chest pain while ambulating to the restroom.  On return to room to provide nitro pt's pt had resolved.

## 2016-10-05 NOTE — Progress Notes (Signed)
   HPI  Patient presents today here with chest pain.  Patient explains that she's had off and on heavy type central chest pain for several days off and on. He states that it has been much worse today and has improved with 2 nitroglycerin. Since arriving in the clinic she states that the chest pain is persistent and still very severe, although somewhat better with nitroglycerin.  She's taken 2 nitroglycerin with benefit. The pain is radiating to her bilateral arms. Also associated with shortness of breath.  She has a history of CAD with evidence of ischemia on stress test in 2013, CVA, type 2 diabetes.   PMH: Smoking status noted ROS: Per HPI  Objective: BP (!) 220/82 (BP Location: Left Arm, Patient Position: Supine, Cuff Size: Normal)   Temp 97.5 F (36.4 C) (Oral)  Gen: NAD, alert, cooperative with exam HEENT: NCAT CV: RRR, good S1/S2 Resp: CTABL, no wheezes, non-labored Ext: No edema, warm Neuro: Alert and oriented, No gross deficits  EKG with some ST elevation in V1 through V3, this is not clearly apparent on her last EKG.   Assessment and plan:  # Chest pain Very concerning for active CAD Emergent transfer to the emergency room via ambulance. Patient has had 4 baby aspirin and 2 nitroglycerin while waiting, oxygen was also placed. Appreciate the emergency room's evaluation and care for the patient    Orders Placed This Encounter  Procedures  . EKG 12-Lead     Laroy Apple, MD [facility] 10/05/2016, 9:11 AM

## 2016-10-05 NOTE — ED Notes (Signed)
2nd report attempted 

## 2016-10-05 NOTE — ED Notes (Addendum)
Spoke with Dr. Elisabeth Most Blood pressure reported with Nitro drip maxed out.   Advised to give 10mg  Norvasc and 20mg  labetalol

## 2016-10-06 LAB — MRSA PCR SCREENING: MRSA by PCR: NEGATIVE

## 2016-10-06 LAB — BASIC METABOLIC PANEL
Anion gap: 8 (ref 5–15)
BUN: 55 mg/dL — ABNORMAL HIGH (ref 6–20)
CO2: 23 mmol/L (ref 22–32)
Calcium: 9.9 mg/dL (ref 8.9–10.3)
Chloride: 107 mmol/L (ref 101–111)
Creatinine, Ser: 2.58 mg/dL — ABNORMAL HIGH (ref 0.44–1.00)
GFR calc Af Amer: 19 mL/min — ABNORMAL LOW (ref >60)
GFR calc non Af Amer: 16 mL/min — ABNORMAL LOW (ref >60)
Glucose, Bld: 175 mg/dL — ABNORMAL HIGH (ref 65–99)
Potassium: 4 mmol/L (ref 3.5–5.1)
Sodium: 138 mmol/L (ref 135–145)

## 2016-10-06 LAB — GLUCOSE, CAPILLARY
GLUCOSE-CAPILLARY: 167 mg/dL — AB (ref 65–99)
Glucose-Capillary: 180 mg/dL — ABNORMAL HIGH (ref 65–99)
Glucose-Capillary: 169 mg/dL — ABNORMAL HIGH (ref 65–99)
Glucose-Capillary: 162 mg/dL — ABNORMAL HIGH (ref 65–99)

## 2016-10-06 LAB — CBC
HCT: 26.8 % — ABNORMAL LOW (ref 36.0–46.0)
Hemoglobin: 8.8 g/dL — ABNORMAL LOW (ref 12.0–15.0)
MCH: 28.9 pg (ref 26.0–34.0)
MCHC: 32.8 g/dL (ref 30.0–36.0)
MCV: 88.2 fL (ref 78.0–100.0)
Platelets: 181 10*3/uL (ref 150–400)
RBC: 3.04 MIL/uL — ABNORMAL LOW (ref 3.87–5.11)
RDW: 13.2 % (ref 11.5–15.5)
WBC: 3.4 10*3/uL — ABNORMAL LOW (ref 4.0–10.5)

## 2016-10-06 LAB — LIPID PANEL
Cholesterol: 292 mg/dL — ABNORMAL HIGH (ref 0–200)
HDL: 34 mg/dL — ABNORMAL LOW (ref >40)
LDL Cholesterol: 211 mg/dL — ABNORMAL HIGH (ref 0–99)
Total CHOL/HDL Ratio: 8.6 RATIO
Triglycerides: 237 mg/dL — ABNORMAL HIGH (ref <150)
VLDL: 47 mg/dL — ABNORMAL HIGH (ref 0–40)

## 2016-10-06 LAB — TROPONIN I: Troponin I: 0.12 ng/mL (ref <0.03)

## 2016-10-06 MED ORDER — CLONIDINE HCL 0.2 MG PO TABS
0.2000 mg | ORAL_TABLET | Freq: Two times a day (BID) | ORAL | Status: DC
  Administered 2016-10-06 – 2016-10-07 (×3): 0.2 mg via ORAL
  Filled 2016-10-06 (×3): qty 1

## 2016-10-06 NOTE — Progress Notes (Signed)
Patient Name: [name] Date of Encounter: 10/06/2016  Primary Cardiologist: [facility] Problem List     Principal Problem:   Angina at rest [facility]) Active Problems:   Cerebrovascular disease   Uncontrolled hypertension   CAD S/P PCI '03 and '08   Anemia in chronic kidney disease   Insulin dependent diabetes mellitus with complications (HCC)   Chronic kidney disease, stage IV (severe) ([place])   Aortic valve sclerosis-2D June 2016   Chest pain with high risk of acute coronary syndrome     Subjective   No further chest pain.  No SOB  Inpatient Medications    Scheduled Meds: . amLODipine  10 mg Oral Daily  . aspirin EC  81 mg Oral Daily  . furosemide  40 mg Oral Daily  . heparin  5,000 Units Subcutaneous Q8H  . insulin aspart  0-15 Units Subcutaneous TID WC  . insulin aspart  0-5 Units Subcutaneous QHS  . levothyroxine  50 mcg Oral QAC breakfast  . lisinopril  40 mg Oral Daily  . metoprolol succinate  50 mg Oral Daily  . sodium chloride flush  3 mL Intravenous Q12H   Continuous Infusions: . nitroGLYCERIN 20 mcg/min (10/05/16 1708)   PRN Meds: sodium chloride, acetaminophen, nitroGLYCERIN, nitroGLYCERIN, ondansetron (ZOFRAN) IV, sodium chloride flush   Vital Signs    Vitals:   10/06/16 0244 10/06/16 0412 10/06/16 0443 10/06/16 0803  BP: (!) 165/55 (!) 151/56 (!) 167/57 (!) 173/57  Pulse: 70 73 70 95  Resp: 14 16 13 13   Temp:  98.4 F (36.9 C)  98 F (36.7 C)  TempSrc:  Oral  Axillary  SpO2: 98% 97% 97% 99%  Weight:  139 lb 3.2 oz (63.1 kg)    Height:        Intake/Output Summary (Last 24 hours) at 10/06/16 1109 Last data filed at 10/06/16 0600  Gross per 24 hour  Intake            81.35 ml  Output              225 ml  Net          -143.65 ml   Filed Weights   10/05/16 2111 10/06/16 0412  Weight: 139 lb 4.8 oz (63.2 kg) 139 lb 3.2 oz (63.1 kg)    Physical Exam    GEN: NAD.  Neck:  no JVD Cardiac:  Rate and Rhythm, 3/6 mid peaking  systolic murmur, rubs, or gallops.  noedema.  Radials/DP/PT 2+  and equal bilaterally.  Respiratory:  Respirations  regular and unlabored, clear to auscultation bilaterally. GI: Soft, nontender, nondistended, BS + x 4. Skin: warm and dry, no rash. Neuro:   Strength and sensation are intact. Psych:  AAOx3.  Normal affect.  Labs    CBC  Recent Labs  10/05/16 1620 10/06/16 0308  WBC 3.7* 3.4*  HGB 10.6* 8.8*  HCT 32.0* 26.8*  MCV 88.2 88.2  PLT 196 0000000   Basic Metabolic Panel  Recent Labs  10/05/16 1052 10/05/16 1620 10/06/16 0308  NA 139  --  138  K 4.6  --  4.0  CL 107  --  107  CO2 23  --  23  GLUCOSE 206*  --  175*  BUN 57*  --  55*  CREATININE 2.64* 2.62* 2.58*  CALCIUM 10.1  --  9.9   Liver Function Tests No results for input(s): AST, ALT, ALKPHOS, BILITOT, PROT, ALBUMIN in the last 72 hours. No results  for input(s): LIPASE, AMYLASE in the last 72 hours. Cardiac Enzymes  Recent Labs  10/05/16 1620 10/05/16 2139 10/06/16 0308  TROPONINI 0.05* 0.14* 0.12*   BNP Invalid input(s): POCBNP D-Dimer No results for input(s): DDIMER in the last 72 hours. Hemoglobin A1C No results for input(s): HGBA1C in the last 72 hours. Fasting Lipid Panel  Recent Labs  10/06/16 0308  CHOL 292*  HDL 34*  LDLCALC 211*  TRIG 237*  CHOLHDL 8.6   Thyroid Function Tests  Recent Labs  10/05/16 1620  TSH 0.859    Telemetry    NSR  ECG    NA  Radiology    Dg Chest 2 View  Result Date: 10/05/2016 CLINICAL DATA:  Midline chest pain, right arm numbness EXAM: CHEST  2 VIEW COMPARISON:  08/12/2016 FINDINGS: Cardiomediastinal silhouette is stable. Atherosclerotic calcifications of thoracic aorta again noted. Osteopenia and mild degenerative changes thoracic spine. No infiltrate or pulmonary edema. Probable chronic mild interstitial prominence. IMPRESSION: No active disease. Probable chronic mild interstitial prominence. Atherosclerotic calcifications of thoracic  aorta. Electronically Signed   By: Lahoma Crocker M.D.   On: 10/05/2016 11:26    Cardiac Studies   Echo pending.   Patient Profile     82 y/o female, lives alone in her own home, from [place] with a history of CAD, DM, HTN, and CRI-4. The pt had an LAD POBA in '03 and an LAD DES and OM DES in '08. In Nov 2013 she was admitted with as NSTEMI but declined cath.   She presented with hypertensive urgency and elevated troponin.   Assessment & Plan    CHEST PAIN:  NSTEMI.  Likely related to her hypertensive urgency.   She is now pain free.  She lives by herself.  She reports that she doesn't know exactly all of her meds and she is clearly out of at least one.  She is a now show for many appts including an echo I ordered in Sept.  She absolutely does not want a cath.  She wants to "go home to be with the [place]."  I will continue to manage medically.  Stop IV NTG and restart Imdur.   CKD III/IV:  Stable creat.   Continue ACE.    HYPERTENSIVE URGENCY:    I am going to start Clonidine PO and try to stop the IV NTG.  I would like to try ultimately to use clonidine patch for compliance.   DYSLIPIDEMIA:  Her lipids are very out of target.  This was not the case in 2015.  It really makes me question whether she is indeed taking her meds.     Signed, Minus Breeding, MD  10/06/2016, 11:09 AM

## 2016-10-06 NOTE — Progress Notes (Signed)
Patient's troponin 0.14; up from 0.05. Patient has had no complaints of chest pain since arrival to the floor. Paged Dr. Aundra Dubin with this information. Will continue to monitor.

## 2016-10-06 NOTE — Progress Notes (Signed)
Inpatient Diabetes Program Recommendations  AACE/ADA: New Consensus Statement on Inpatient Glycemic Control (2015)  Target Ranges:  Prepandial:   less than 140 mg/dL      Peak postprandial:   less than 180 mg/dL (1-2 hours)      Critically ill patients:  140 - 180 mg/dL   Lab Results  Component Value Date   GLUCAP 167 (H) 10/06/2016   HGBA1C 6.9 01/08/2016    Review of Glycemic Control:  Results for [name], [name] (MRN [id number]) as of 10/06/2016 11:29  Ref. Range 10/05/2016 18:37 10/05/2016 21:16 10/06/2016 07:36  Glucose-Capillary Latest Ref Range: 65 - 99 mg/dL 261 (H) 292 (H) 167 (H)   Diabetes history: Type 2 diabetes Outpatient Diabetes medications: NPH 20 units bid Current orders for Inpatient glycemic control:  Novolog moderate tid with meals and HS  Inpatient Diabetes Program Recommendations:   Consider adding Lantus 10 units daily while patient is in the hospital.    Thanks, Adah Perl, RN, BC-ADM Inpatient Diabetes Coordinator Pager [phone] (8a-5p)

## 2016-10-07 ENCOUNTER — Inpatient Hospital Stay (HOSPITAL_COMMUNITY): Payer: Medicare Other

## 2016-10-07 DIAGNOSIS — R079 Chest pain, unspecified: Secondary | ICD-10-CM

## 2016-10-07 LAB — GLUCOSE, CAPILLARY
GLUCOSE-CAPILLARY: 238 mg/dL — AB (ref 65–99)
GLUCOSE-CAPILLARY: 59 mg/dL — AB (ref 65–99)
Glucose-Capillary: 199 mg/dL — ABNORMAL HIGH (ref 65–99)
Glucose-Capillary: 81 mg/dL (ref 65–99)

## 2016-10-07 LAB — HEMOGLOBIN A1C
Hgb A1c MFr Bld: 6.8 % — ABNORMAL HIGH (ref 4.8–5.6)
Mean Plasma Glucose: 148 mg/dL

## 2016-10-07 LAB — ECHOCARDIOGRAM COMPLETE
Height: 66 in
Weight: 2232 oz

## 2016-10-07 MED ORDER — INSULIN GLARGINE 100 UNIT/ML ~~LOC~~ SOLN
10.0000 [IU] | Freq: Every day | SUBCUTANEOUS | Status: DC
  Administered 2016-10-07: 10 [IU] via SUBCUTANEOUS
  Filled 2016-10-07: qty 0.1

## 2016-10-07 MED ORDER — METOPROLOL SUCCINATE ER 25 MG PO TB24: 25.0000 mg | ORAL_TABLET | Freq: Every day | ORAL | 6 refills | Status: DC

## 2016-10-07 MED ORDER — ASPIRIN 81 MG PO TBEC: 81.0000 mg | DELAYED_RELEASE_TABLET | Freq: Every day | ORAL | 11 refills | Status: AC

## 2016-10-07 MED ORDER — CLONIDINE HCL 0.2 MG/24HR TD PTWK: 0.2000 mg | MEDICATED_PATCH | TRANSDERMAL | 12 refills | Status: DC

## 2016-10-07 MED ORDER — CLONIDINE HCL 0.2 MG/24HR TD PTWK
0.2000 mg | MEDICATED_PATCH | TRANSDERMAL | Status: DC
  Administered 2016-10-07: 0.2 mg via TRANSDERMAL
  Filled 2016-10-07: qty 1

## 2016-10-07 NOTE — Progress Notes (Signed)
Error

## 2016-10-07 NOTE — Care Management Note (Signed)
Case Management Note  Patient Details  Name: [name] MRN: [id number] Date of Birth: 06/15/1932  Subjective/Objective:   Pt presented for Angina @ rest. Pt is from home alone. Plan will be to return home with [facility] PT Services. Per pt her daughter will be able to check in on her at d/c.                Action/Plan: Pt is agreeable to [facility] Services. Agency List provided and the plan will be for home with HHPT via [place]. Referral made to [facility] and [place] to begin within 24-48 hours post d/c. No further needs from CM at this time.   Expected Discharge Date:                  Expected Discharge Plan:  [place]  In-House Referral:  NA  Discharge planning Services  CM Consult  Post Acute Care Choice:  NA Choice offered to:  NA  DME Arranged:  N/A DME Agency:  NA  HH Arranged:  PT HH Agency:  Home  Status of Service:  Completed, signed off  If discussed at [place] of Stay Meetings, dates discussed:    Additional Comments:  Bethena Roys, RN 10/07/2016, 2:19 PM

## 2016-10-07 NOTE — Progress Notes (Signed)
Pt has been discharged. RN has been unable to reach daughter. RN called son, who is in [place]. Will continue to monitor. Etta Quill, RN

## 2016-10-07 NOTE — Consult Note (Addendum)
   [facility] CM Inpatient Consult   10/07/2016  Keyshawn Larrison Daniely 1932/09/13 [id number]    [facility] Care Management follow up. Spoke with inpatient RNCM. Patient is now awake. Spoke with Ms. Breyette at bedside to explain and offer [place] Management program. She is agreeable and written consent was obtained.  Ms. Moriarity lives alone. Her daughter assists with taking her to MD appointments if needed. Even though she lives alone, Ms. Piraino states her daughter lives on the same street several houses down from her and is very supportive. Denies having issues with affording or obtaining medications. States she has a bp cuff. Encouraged her to make a Primary MD appointment for within 7 to 10 days of hospital discharge. She is agreeable to this. Confirmed best contact number as [phone].  Explained that [place] Management will not interfere or replace services provided by home health. [facility] Care Management packet, contact information, and 24-hr nurse line magnet provided. Made inpatient RNCM aware that [place] Management will follow post discharge.  Confirmed her physical address as [address], [place], [place] [address].   Marthenia Rolling, MSN-Ed, RN,BSN [facility] Liaison [phone]

## 2016-10-07 NOTE — Discharge Summary (Signed)
Discharge Summary    Patient ID: [name],  MRN: [id number], DOB/AGE: [birth year] 82 y.o.  Admit date: 10/05/2016 Discharge date: 10/07/2016  Primary Care Provider: Kenn File Primary Cardiologist: Dr.Kosisochukwu Goldberg   Discharge Diagnoses    Principal Problem:   Angina at rest [facility]) Active Problems:   Cerebrovascular disease   Uncontrolled hypertension   CAD S/P PCI '03 and '08   Anemia in chronic kidney disease   Insulin dependent diabetes mellitus with complications ([place])   Chronic kidney disease, stage IV (severe) ([place])   Aortic valve sclerosis-2D June 2016   Chest pain with high risk of acute coronary syndrome   Allergies Allergies  Allergen Reactions  . Propoxyphene N-Acetaminophen     Pt does not know reaction  . Simvastatin Other (See Comments)    headache    Diagnostic Studies/Procedures    Pending echo - will do as outpatient    History of Present Illness      82 y/o female, lives alone in her own home, from [place] with a history of CAD, DM, HTN, and CRI-4. The pt had an LAD POBA in '03 and an LAD DES and OM DES in '08. In Nov 2013 she was admitted with as NSTEMI but declined cath. She had done well on medical Rx, LOV Aug 2016. She was sent from [facility] to [facility] ED after she presented there with increasing chest pain "burning" and uncontrolled HTN. She admits to having pain off and on for months. She takes NTG with temporary relief. She says she has been compliant with her medications. In the ED her initial Troponin is negative. Her B/P is > [id number] systolic. Her two daughters accompanied her to the ED.    Hospital Course     Consultants: None  1. Chest pain - Likely due to hypertensive urgency. Troponin was minimally elevated with flat trend. Pain  Resolved on IV nitro and Iv heparin. Currently discontinued. Pending is still pending. She does not wants cath. She lives by herself. Evaluated by PT recommended HHPT. Compliance is long term issue. Echo as  outpatient however has many no shows.   2. Hypertensive urgency - Improved. Intermittently elevated. Added clonidine and switch to patch for compliance. Continue Norvasc, lisinopril and metoprolol.  3. Sinus Bradycardia - Held BB today due to rate of 40-50s. Reduce to 25mg  qd at discharge.   4. CKD stage III-IV - Scr stable. Continue ACE  5. HLD - 10/06/2016: Cholesterol 292; HDL 34; LDL Cholesterol 211; Triglycerides 237; VLDL 47  - She is not takiing statin. Seems she was on simvastatin in past, unknown reason however listed "Headache" as allergy. Consider adding another agent as outpatient.   6. DM - Lantus added per recommendation. F/u with PCP.   The patient has been seen by Dr. Percival Spanish.  today and deemed ready for discharge home. All follow-up appointments have been scheduled. Discharge medications are listed below.  _____________   Discharge Vitals Blood pressure (!) 138/110, pulse (!) 52, temperature 97.6 F (36.4 C), temperature source Oral, resp. rate 18, height 5\' 6"  (1.676 m), weight 139 lb 8 oz (63.3 kg), SpO2 100 %.  Filed Weights   10/05/16 2111 10/06/16 0412 10/07/16 0400  Weight: 139 lb 4.8 oz (63.2 kg) 139 lb 3.2 oz (63.1 kg) 139 lb 8 oz (63.3 kg)    Labs & Radiologic Studies     CBC  Recent Labs  10/05/16 1620 10/06/16 0308  WBC 3.7* 3.4*  HGB 10.6*  8.8*  HCT 32.0* 26.8*  MCV 88.2 88.2  PLT 196 0000000   Basic Metabolic Panel  Recent Labs  10/05/16 1052 10/05/16 1620 10/06/16 0308  NA 139  --  138  K 4.6  --  4.0  CL 107  --  107  CO2 23  --  23  GLUCOSE 206*  --  175*  BUN 57*  --  55*  CREATININE 2.64* 2.62* 2.58*  CALCIUM 10.1  --  9.9   Liver Function Tests No results for input(s): AST, ALT, ALKPHOS, BILITOT, PROT, ALBUMIN in the last 72 hours. No results for input(s): LIPASE, AMYLASE in the last 72 hours. Cardiac Enzymes  Recent Labs  10/05/16 1620 10/05/16 2139 10/06/16 0308  TROPONINI 0.05* 0.14* 0.12*   BNP Invalid  input(s): POCBNP D-Dimer No results for input(s): DDIMER in the last 72 hours. Hemoglobin A1C  Recent Labs  10/06/16 0308  HGBA1C 6.8*   Fasting Lipid Panel  Recent Labs  10/06/16 0308  CHOL 292*  HDL 34*  LDLCALC 211*  TRIG 237*  CHOLHDL 8.6   Thyroid Function Tests  Recent Labs  10/05/16 1620  TSH 0.859    Dg Chest 2 View  Result Date: 10/05/2016 CLINICAL DATA:  Midline chest pain, right arm numbness EXAM: CHEST  2 VIEW COMPARISON:  08/12/2016 FINDINGS: Cardiomediastinal silhouette is stable. Atherosclerotic calcifications of thoracic aorta again noted. Osteopenia and mild degenerative changes thoracic spine. No infiltrate or pulmonary edema. Probable chronic mild interstitial prominence. IMPRESSION: No active disease. Probable chronic mild interstitial prominence. Atherosclerotic calcifications of thoracic aorta. Electronically Signed   By: Lahoma Crocker M.D.   On: 10/05/2016 11:26    Disposition   Pt is being discharged home today in good condition.  Follow-up Plans & Appointments    Follow-up Information    Kenn File, MD. Schedule an appointment as soon as possible for a visit in 1 week(s).   Specialty:  Family Medicine Why:  for post hospital  Contact information: [place] [address] [phone]        Rosaria Ferries, PA-C. Go on 10/28/2016.   Specialties:  Cardiology, Radiology Why:  @10 :30 am for post hospital Contact information: [place] [address] [phone]          Discharge Instructions    Diet - low sodium heart healthy    Complete by:  As directed    Increase activity slowly    Complete by:  As directed       Discharge Medications   Current Discharge Medication List    START taking these medications   Details  aspirin EC 81 MG EC tablet Take 1 tablet (81 mg total) by mouth daily. Qty: 30 tablet, Refills: 11    cloNIDine (CATAPRES - DOSED IN MG/24 HR) 0.2 mg/24hr patch Place  1 patch (0.2 mg total) onto the skin every Friday. Qty: 4 patch, Refills: 12      CONTINUE these medications which have CHANGED   Details  metoprolol succinate (TOPROL-XL) 25 MG 24 hr tablet Take 1 tablet (25 mg total) by mouth daily. Qty: 30 tablet, Refills: 6   Associated Diagnoses: Essential hypertension      CONTINUE these medications which have NOT CHANGED   Details  ACCU-CHEK AVIVA PLUS test strip CHECK BLOOD SUGER UP TO 3 TIMES A DAY Qty: 100 each, Refills: 1    amLODipine (NORVASC) 10 MG tablet Take 1 tablet (10 mg total) by mouth daily.  Qty: 90 tablet, Refills: 0    diclofenac sodium (VOLTAREN) 1 % GEL Apply 4 g topically 4 (four) times daily. Qty: 100 g, Refills: 2    furosemide (LASIX) 40 MG tablet Take 40 mg by mouth.    insulin NPH Human (HUMULIN N) 100 UNIT/ML injection Inject 0.2 mLs (20 Units total) into the skin 2 (two) times daily. Qty: 10 mL, Refills: 5    isosorbide mononitrate (IMDUR) 120 MG 24 hr tablet TAKE 1 TABLET DAILY Qty: 30 tablet, Refills: 5    levothyroxine (SYNTHROID, LEVOTHROID) 50 MCG tablet Take 1 tablet (50 mcg total) by mouth daily. Qty: 90 tablet, Refills: 0    lisinopril (PRINIVIL,ZESTRIL) 40 MG tablet Take 1 tablet (40 mg total) by mouth daily. Qty: 90 tablet, Refills: 3    nitroGLYCERIN (NITROSTAT) 0.4 MG SL tablet Place 1 tablet (0.4 mg total) under the tongue every 5 (five) minutes x 3 doses as needed for chest pain. Qty: 30 tablet, Refills: 11      STOP taking these medications     azithromycin (ZITHROMAX) 500 MG tablet      benzonatate (TESSALON PERLES) 100 MG capsule             Outstanding Labs/Studies   Echo  Duration of Discharge Encounter   Greater than 30 minutes including physician time.  Signed, Bhagat,Bhavinkumar PA-C 10/07/2016, 1:01 PM   Patient seen and examined.  Plan as discussed in my rounding note for today and outlined above. Jeneen Rinks Jobeth Pangilinan  10/07/2016  1:51 PM

## 2016-10-07 NOTE — Evaluation (Signed)
Physical Therapy Evaluation Patient Details Name: [name] MRN: [id number] DOB: 18-Oct-1932 Today's Date: 10/07/2016   History of Present Illness  82 yo admitted with angina and NSTEMI. PMHx: CAD, DM, HTN, CRI, CVA, right knee surgery  Clinical Impression  Pt with flat affect but agreeable to mobility. Pt lives alone with daughter who checks in on her a few times a week, pt also reports 2 falls in the last year with inability to rise after fall. Pt with impaired balance, strength and activity tolerance who will benefit from acute therapy to maximize mobility, gait and function to decrease burden of care and fall risk.   BP 125/64 before activity, 138/110 after HR 55-62     Follow Up Recommendations Home health PT    Equipment Recommendations  None recommended by PT    Recommendations for Other Services       Precautions / Restrictions Precautions Precautions: Fall      Mobility  Bed Mobility Overal bed mobility: Modified Independent                Transfers Overall transfer level: Needs assistance   Transfers: Sit to/from Stand Sit to Stand: Supervision         General transfer comment: cues for hand placement and to back fully to surface  Ambulation/Gait Ambulation/Gait assistance: Supervision Ambulation Distance (Feet): 250 Feet Assistive device: Rolling walker (2 wheeled) Gait Pattern/deviations: Step-through pattern;Decreased stride length;Trunk flexed   Gait velocity interpretation: Below normal speed for age/gender General Gait Details: cues for posture and position in RW. pt initiated gait without RW but reaching out for frequent environmental supports and educated for need for AD for safety and to decrease fall risk  Stairs Stairs: Yes Stairs assistance: Modified independent (Device/Increase time) Stair Management: Step to pattern;Sideways;Forwards;One rail Left Number of Stairs: 3 General stair comments: Pt ascended forward with left  rail with step to pattern and descended sideways with step to pattern. Pt with decreased RLE knee flexion with difficulty lifting leg onto step  Wheelchair Mobility    Modified Rankin (Stroke Patients Only)       Balance Overall balance assessment: Needs assistance   Sitting balance-Leahy Scale: Good       Standing balance-Leahy Scale: Good                               Pertinent Vitals/Pain Pain Assessment: No/denies pain    Home Living Family/patient expects to be discharged to:: Private residence Living Arrangements: Alone Available Help at Discharge: Family;Available PRN/intermittently Type of Home: House Home Access: Stairs to enter Entrance Stairs-Rails: Left Entrance Stairs-Number of Steps: 3 Home Layout: One level Home Equipment: Walker - 2 wheels;Cane - single point      Prior Function Level of Independence: Independent         Comments: pt cares for herself, drives, cleans and gardens, pt does not cook and goes to a cafe for all meals     Hand Dominance        Extremity/Trunk Assessment   Upper Extremity Assessment: Overall WFL for tasks assessed           Lower Extremity Assessment: Generalized weakness (pt with 3/5 hip flexion, 4/5 knee extension and flexion bil LE)      Cervical / Trunk Assessment: Kyphotic  Communication   Communication: No difficulties  Cognition Arousal/Alertness: Awake/alert Behavior During Therapy: WFL for tasks assessed/performed Overall Cognitive Status: Within Functional Limits for  tasks assessed                      General Comments      Exercises     Assessment/Plan    PT Assessment Patient needs continued PT services  PT Problem List Decreased mobility;Decreased balance;Decreased knowledge of use of DME          PT Treatment Interventions Gait training;DME instruction;Therapeutic activities;Therapeutic exercise;Patient/family education;Balance training;Functional mobility  training    PT Goals (Current goals can be found in the Care Plan section)  Acute Rehab PT Goals Patient Stated Goal: return home PT Goal Formulation: With patient Time For Goal Achievement: 10/14/16 Potential to Achieve Goals: Good    Frequency Min 3X/week   Barriers to discharge Decreased caregiver support      Co-evaluation               End of Session Equipment Utilized During Treatment: Gait belt Activity Tolerance: Patient tolerated treatment well Patient left: in chair;with call bell/phone within reach;with chair alarm set Nurse Communication: Mobility status         Time: 1211-1231 PT Time Calculation (min) (ACUTE ONLY): 20 min   Charges:   PT Evaluation $PT Eval Moderate Complexity: 1 Procedure     PT G CodesMelford Aase 10/07/2016, 1:31 PM  [name], [place]

## 2016-10-07 NOTE — Progress Notes (Signed)
  Echocardiogram 2D Echocardiogram has been performed.  [name] 10/07/2016, 1:41 PM

## 2016-10-07 NOTE — Progress Notes (Signed)
Patient Name: [name] Date of Encounter: 10/07/2016  Primary Cardiologist: [facility] Problem List     Principal Problem:   Angina at rest [facility]) Active Problems:   Cerebrovascular disease   Uncontrolled hypertension   CAD S/P PCI '03 and '08   Anemia in chronic kidney disease   Insulin dependent diabetes mellitus with complications ([facility])   Chronic kidney disease, stage IV (severe) ([place])   Aortic valve sclerosis-2D June 2016   Chest pain with high risk of acute coronary syndrome     Subjective   No further chest pain.  No SOB.  She wants to go home.  She complains of a dry mouth.   Inpatient Medications    Scheduled Meds: . amLODipine  10 mg Oral Daily  . aspirin EC  81 mg Oral Daily  . cloNIDine  0.2 mg Oral BID  . furosemide  40 mg Oral Daily  . heparin  5,000 Units Subcutaneous Q8H  . insulin aspart  0-15 Units Subcutaneous TID WC  . insulin aspart  0-5 Units Subcutaneous QHS  . levothyroxine  50 mcg Oral QAC breakfast  . lisinopril  40 mg Oral Daily  . metoprolol succinate  50 mg Oral Daily  . sodium chloride flush  3 mL Intravenous Q12H   Continuous Infusions:  PRN Meds: sodium chloride, acetaminophen, nitroGLYCERIN, nitroGLYCERIN, ondansetron (ZOFRAN) IV, sodium chloride flush   Vital Signs    Vitals:   10/07/16 0005 10/07/16 0400 10/07/16 0436 10/07/16 0733  BP: (!) 154/51  (!) 136/47 (!) 165/49  Pulse: (!) 56  (!) 52 (!) 58  Resp: 11  16 10   Temp: 98.7 F (37.1 C)  98.1 F (36.7 C) 98.6 F (37 C)  TempSrc: Oral  Oral Oral  SpO2: 95%  100% 98%  Weight:  139 lb 8 oz (63.3 kg)    Height:        Intake/Output Summary (Last 24 hours) at 10/07/16 1059 Last data filed at 10/07/16 0900  Gross per 24 hour  Intake              514 ml  Output              750 ml  Net             -236 ml   Filed Weights   10/05/16 2111 10/06/16 0412 10/07/16 0400  Weight: 139 lb 4.8 oz (63.2 kg) 139 lb 3.2 oz (63.1 kg) 139 lb 8 oz (63.3 kg)     Physical Exam    GEN: NAD.  Neck:  no JVD Cardiac:  Rate and Rhythm, 3/6 mid peaking systolic murmur, rubs, or gallops.  noedema.  Radials/DP/PT 2+  and equal bilaterally.  Respiratory:  Respirations  regular and unlabored, clear to auscultation bilaterally. GI: Soft, nontender, nondistended, BS + x 4. Skin: warm and dry, no rash. Neuro:   Strength and sensation are intact. Psych:  AAOx3.  Normal affect.  Labs    CBC  Recent Labs  10/05/16 1620 10/06/16 0308  WBC 3.7* 3.4*  HGB 10.6* 8.8*  HCT 32.0* 26.8*  MCV 88.2 88.2  PLT 196 0000000   Basic Metabolic Panel  Recent Labs  10/05/16 1052 10/05/16 1620 10/06/16 0308  NA 139  --  138  K 4.6  --  4.0  CL 107  --  107  CO2 23  --  23  GLUCOSE 206*  --  175*  BUN 57*  --  55*  CREATININE 2.64* 2.62* 2.58*  CALCIUM 10.1  --  9.9   Liver Function Tests No results for input(s): AST, ALT, ALKPHOS, BILITOT, PROT, ALBUMIN in the last 72 hours. No results for input(s): LIPASE, AMYLASE in the last 72 hours. Cardiac Enzymes  Recent Labs  10/05/16 1620 10/05/16 2139 10/06/16 0308  TROPONINI 0.05* 0.14* 0.12*   BNP Invalid input(s): POCBNP D-Dimer No results for input(s): DDIMER in the last 72 hours. Hemoglobin A1C  Recent Labs  10/06/16 0308  HGBA1C 6.8*   Fasting Lipid Panel  Recent Labs  10/06/16 0308  CHOL 292*  HDL 34*  LDLCALC 211*  TRIG 237*  CHOLHDL 8.6   Thyroid Function Tests  Recent Labs  10/05/16 1620  TSH 0.859    Telemetry    NSR  ECG    NA  Radiology    Dg Chest 2 View  Result Date: 10/05/2016 CLINICAL DATA:  Midline chest pain, right arm numbness EXAM: CHEST  2 VIEW COMPARISON:  08/12/2016 FINDINGS: Cardiomediastinal silhouette is stable. Atherosclerotic calcifications of thoracic aorta again noted. Osteopenia and mild degenerative changes thoracic spine. No infiltrate or pulmonary edema. Probable chronic mild interstitial prominence. IMPRESSION: No active disease.  Probable chronic mild interstitial prominence. Atherosclerotic calcifications of thoracic aorta. Electronically Signed   By: Lahoma Crocker M.D.   On: 10/05/2016 11:26    Cardiac Studies   Echo pending.   Patient Profile     82 y/o female, lives alone in her own home, from [place] with a history of CAD, DM, HTN, and CRI-4. The pt had an LAD POBA in '03 and an LAD DES and OM DES in '08. In Nov 2013 she was admitted with as NSTEMI but declined cath.   She presented with hypertensive urgency and elevated troponin.   Assessment & Plan    CHEST PAIN:  NSTEMI.  Likely related to her hypertensive urgency.   She lives by herself.  She reports that she doesn't know exactly all of her meds and she is clearly out of at least one.  She is a no show for many appts including an echo I ordered in Sept.  She absolutely does not want a cath.  She wants to "go home to be with the [place]."  I will continue to manage medically.  Stopped IV NTG and restarted Imdur yesterday.    CKD III/IV:  Stable creat.   Continue ACE.    HYPERTENSIVE URGENCY:  I started clonidine and will change to patch for compliance.  DYSLIPIDEMIA:  Her lipids are very out of target.  This was not the case in 2015.  It really makes me question whether she is indeed taking her meds.     Signed, Minus Breeding, MD  10/07/2016, 10:59 AM

## 2016-10-07 NOTE — Progress Notes (Signed)
Pt discharged home. Discharge instructions have been gone over with the patient. IV's removed. Pt given unit number and told to call if they have any concerns regarding their discharge instructions. Pt family to pick up pt

## 2016-10-07 NOTE — Consult Note (Signed)
   [facility] CM Inpatient Consult   10/07/2016  [name] 04-16-1932 [id number]    Patient screened for [place] Management program. Martin Majestic to bedside. Her echo was just completed. However, she was sleeping soundly and did not awake to her name being called. Will follow up at later time. Made inpatient RNCM aware. [facility] Care Management and contact information was left at bedside as well.   Marthenia Rolling, MSN-Ed, RN,BSN [facility] Liaison [phone]

## 2016-10-10 ENCOUNTER — Telehealth: Payer: Self-pay | Admitting: *Deleted

## 2016-10-11 ENCOUNTER — Other Ambulatory Visit: Payer: Self-pay | Admitting: *Deleted

## 2016-10-11 ENCOUNTER — Encounter: Payer: Self-pay | Admitting: *Deleted

## 2016-10-11 DIAGNOSIS — D638 Anemia in other chronic diseases classified elsewhere: Secondary | ICD-10-CM | POA: Diagnosis not present

## 2016-10-11 DIAGNOSIS — I209 Angina pectoris, unspecified: Secondary | ICD-10-CM | POA: Diagnosis not present

## 2016-10-11 DIAGNOSIS — Z7982 Long term (current) use of aspirin: Secondary | ICD-10-CM | POA: Diagnosis not present

## 2016-10-11 DIAGNOSIS — I2510 Atherosclerotic heart disease of native coronary artery without angina pectoris: Secondary | ICD-10-CM | POA: Diagnosis not present

## 2016-10-11 DIAGNOSIS — E1122 Type 2 diabetes mellitus with diabetic chronic kidney disease: Secondary | ICD-10-CM | POA: Diagnosis not present

## 2016-10-11 DIAGNOSIS — E785 Hyperlipidemia, unspecified: Secondary | ICD-10-CM | POA: Diagnosis not present

## 2016-10-11 DIAGNOSIS — I129 Hypertensive chronic kidney disease with stage 1 through stage 4 chronic kidney disease, or unspecified chronic kidney disease: Secondary | ICD-10-CM | POA: Diagnosis not present

## 2016-10-11 DIAGNOSIS — N184 Chronic kidney disease, stage 4 (severe): Secondary | ICD-10-CM | POA: Diagnosis not present

## 2016-10-11 DIAGNOSIS — I350 Nonrheumatic aortic (valve) stenosis: Secondary | ICD-10-CM | POA: Diagnosis not present

## 2016-10-11 DIAGNOSIS — Z794 Long term (current) use of insulin: Secondary | ICD-10-CM | POA: Diagnosis not present

## 2016-10-11 NOTE — Telephone Encounter (Signed)
Call Completed and Appointment Scheduled: Yes, Date: 10/17/16 with Dr Marline Backbone INFORMATION Date of Discharge:10/07/16   Discharge Facility: [facility]   Principal Discharge Diagnosis: Chest pain, HTN  Patient and/or caregiver is knowledgeable of his/her condition(s) and treatment: Yes  MEDICATION RECONCILIATION Current medication list reviewed with patient:Yes  Outpatient Encounter Prescriptions as of 10/10/2016  Medication Sig  . ACCU-CHEK AVIVA PLUS test strip CHECK BLOOD SUGER UP TO 3 TIMES A DAY  . amLODipine (NORVASC) 10 MG tablet Take 1 tablet (10 mg total) by mouth daily.  Marland Kitchen aspirin EC 81 MG EC tablet Take 1 tablet (81 mg total) by mouth daily.  Derrill Memo ON 10/14/2016] cloNIDine (CATAPRES - DOSED IN MG/24 HR) 0.2 mg/24hr patch Place 1 patch (0.2 mg total) onto the skin every Friday.  . diclofenac sodium (VOLTAREN) 1 % GEL Apply 4 g topically 4 (four) times daily.  . furosemide (LASIX) 40 MG tablet Take 40 mg by mouth.  . insulin NPH Human (HUMULIN N) 100 UNIT/ML injection Inject 0.2 mLs (20 Units total) into the skin 2 (two) times daily.  . isosorbide mononitrate (IMDUR) 120 MG 24 hr tablet TAKE 1 TABLET DAILY  . levothyroxine (SYNTHROID, LEVOTHROID) 50 MCG tablet Take 1 tablet (50 mcg total) by mouth daily.  Marland Kitchen lisinopril (PRINIVIL,ZESTRIL) 40 MG tablet Take 1 tablet (40 mg total) by mouth daily.  . metoprolol succinate (TOPROL-XL) 25 MG 24 hr tablet Take 1 tablet (25 mg total) by mouth daily.  . nitroGLYCERIN (NITROSTAT) 0.4 MG SL tablet Place 1 tablet (0.4 mg total) under the tongue every 5 (five) minutes x 3 doses as needed for chest pain.   No facility-administered encounter medications on file as of 10/10/2016.     Discharge Medications reviewed and reconciled with current medications.yes  Patient is able to obtain needed medications:Yes  ACTIVITIES OF DAILY LIVING  Is the patient able to perform his/her own ADLs: Yes.    Patient is receiving home health  services: No.  PATIENT EDUCATION Questions/Concerns Discussed: Patient is taking medications as prescribed and states that her blood sugar numbers are good. She will call back if she needs anything prior to her appointment.

## 2016-10-11 NOTE — Patient Outreach (Signed)
Telephone call to pt for transition of care week 1, pt hospitalized 11/8-11/10/17, spoke with pt, HIPAA verified, pt reports her daughter oversees all medications and is not present to speak with and pt unable to reconcile over the phone, pt to see primary MD on 10/17/16, [place] working with pt, pt did not weigh today but will start back weighing tomorrow. Pt states she has good family support, transportation, has all medications.  [facility] CM Care Plan Problem One   Flowsheet Row Most Recent Value  Care Plan Problem One  HIgh risk for readmission related to disease process/ CHF  Role Documenting the Problem One  Care Management Coordinator  Care Plan for Problem One  Active  [facility] Long Term Goal (31-90 days)  Pt will verbalize better understanding of disease process CHF to facilitate better outcomes and avoid hospital readmission within 90 days.  [facility] Long Term Goal Start Date  10/11/16  Interventions for Problem One Long Term Goal  RN CM reviewed importance of daily weights with [employer] telemonitoring, importance of attending all follow up MD appointments.      PLAN Continue weekly transition of care calls See pt for home visit 10/25/16  Jacqlyn Larsen [facility] [place] Coordinator [phone]

## 2016-10-13 DIAGNOSIS — I209 Angina pectoris, unspecified: Secondary | ICD-10-CM | POA: Diagnosis not present

## 2016-10-13 DIAGNOSIS — I129 Hypertensive chronic kidney disease with stage 1 through stage 4 chronic kidney disease, or unspecified chronic kidney disease: Secondary | ICD-10-CM | POA: Diagnosis not present

## 2016-10-13 DIAGNOSIS — N184 Chronic kidney disease, stage 4 (severe): Secondary | ICD-10-CM | POA: Diagnosis not present

## 2016-10-13 DIAGNOSIS — Z794 Long term (current) use of insulin: Secondary | ICD-10-CM | POA: Diagnosis not present

## 2016-10-13 DIAGNOSIS — E785 Hyperlipidemia, unspecified: Secondary | ICD-10-CM | POA: Diagnosis not present

## 2016-10-13 DIAGNOSIS — Z7982 Long term (current) use of aspirin: Secondary | ICD-10-CM | POA: Diagnosis not present

## 2016-10-13 DIAGNOSIS — I2510 Atherosclerotic heart disease of native coronary artery without angina pectoris: Secondary | ICD-10-CM | POA: Diagnosis not present

## 2016-10-13 DIAGNOSIS — D638 Anemia in other chronic diseases classified elsewhere: Secondary | ICD-10-CM | POA: Diagnosis not present

## 2016-10-13 DIAGNOSIS — I350 Nonrheumatic aortic (valve) stenosis: Secondary | ICD-10-CM | POA: Diagnosis not present

## 2016-10-13 DIAGNOSIS — E1122 Type 2 diabetes mellitus with diabetic chronic kidney disease: Secondary | ICD-10-CM | POA: Diagnosis not present

## 2016-10-17 ENCOUNTER — Ambulatory Visit (INDEPENDENT_AMBULATORY_CARE_PROVIDER_SITE_OTHER): Payer: Medicare Other | Admitting: Family Medicine

## 2016-10-17 ENCOUNTER — Encounter: Payer: Self-pay | Admitting: Family Medicine

## 2016-10-17 VITALS — BP 154/55 | HR 58 | Temp 96.7°F | Ht 66.0 in | Wt 151.6 lb

## 2016-10-17 DIAGNOSIS — E785 Hyperlipidemia, unspecified: Secondary | ICD-10-CM

## 2016-10-17 DIAGNOSIS — Z794 Long term (current) use of insulin: Secondary | ICD-10-CM

## 2016-10-17 DIAGNOSIS — Z9861 Coronary angioplasty status: Secondary | ICD-10-CM

## 2016-10-17 DIAGNOSIS — M47816 Spondylosis without myelopathy or radiculopathy, lumbar region: Secondary | ICD-10-CM | POA: Diagnosis not present

## 2016-10-17 DIAGNOSIS — I10 Essential (primary) hypertension: Secondary | ICD-10-CM | POA: Diagnosis not present

## 2016-10-17 DIAGNOSIS — I2510 Atherosclerotic heart disease of native coronary artery without angina pectoris: Secondary | ICD-10-CM | POA: Diagnosis not present

## 2016-10-17 DIAGNOSIS — IMO0001 Reserved for inherently not codable concepts without codable children: Secondary | ICD-10-CM

## 2016-10-17 DIAGNOSIS — E118 Type 2 diabetes mellitus with unspecified complications: Secondary | ICD-10-CM | POA: Diagnosis not present

## 2016-10-17 MED ORDER — CLONIDINE HCL 0.1 MG PO TABS: 0.1000 mg | ORAL_TABLET | Freq: Three times a day (TID) | ORAL | 3 refills | Status: DC

## 2016-10-17 MED ORDER — ROSUVASTATIN CALCIUM 10 MG PO TABS: 10.0000 mg | ORAL_TABLET | Freq: Every day | ORAL | 5 refills | Status: DC

## 2016-10-17 MED ORDER — INSULIN GLARGINE 100 UNIT/ML SOLOSTAR PEN: 20.0000 [IU] | PEN_INJECTOR | Freq: Every day | SUBCUTANEOUS | 5 refills | Status: DC

## 2016-10-17 NOTE — Progress Notes (Signed)
HPI  Patient presents today here for hospital follow-up.  Patient was admitted the hospital with hypertensive emergency and unstable angina likely due to the hypertension. She had a stable elevation of troponins. She was offered catheterization which she declined.  Patient states that since getting home she's been a bit more weak than usual. She's had one episode of chest pain about one week ago that lasted 30 minutes and then resolved. She cannot afford clonidine patches  She's breathing normally currently.  Her niece is seen with her today, she is a [employer]ar[employer] manager for home health. She requests home health CNA for medication management, for bathing, preparing meals. Patient currently needs help leaving home  Patient complains of hypoglycemia in the early morning hours, several measured in the 70s, on one occasion in the 50s She is taking 20 units of NPH in the morning and 13 at night.  Patient is weak but refuses to use her walker, she had a fall one week ago  PMH: Smoking status noted ROS: Per HPI  Objective: BP (!) 154/55   Pulse (!) 58   Temp (!) 96.7 F (35.9 C) (Oral)   Ht '5\' 6"'$  (1.676 m)   Wt 151 lb 9.6 oz (68.8 kg)   BMI 24.47 kg/m  Gen: NAD, alert, cooperative with exam HEENT: NCAT CV: RRR, good Q0/H4, 3/6 systolic murmur loudest at the right sternal border Resp: CTABL, no wheezes, non-labored Ext: 1-2+ pitting edema bilateral lower extremities Neuro: Alert and oriented, No gross deficits  Assessment and plan:  # CAD 1 episode of chest pain which resolved with nitroglycerin at home, about one week ago Patient declining further invasive treatment, treat medically with nitrates, beta blocker, ACE inhibitor Optimize hypertension and diabetes control She has cardiology follow-up in about 2 weeks  # Hypertension, uncontrolled Continue current medications, cannot afford clonidine patch Starting clonidine pill, emphasized 3 times a day dosing which the patient feels she  can do Patient on calcium channel blocker, loop diuretic, ACE inhibitor, beta blocker Cannot tolerate higher beta blocker dose given pulse Also on Imdur  # Diabetes Some complaints of mild hypoglycemia Changing from NPH to Lantus, reduce total insulin dose from 33units to 20 units daily, will slowly increase  # lumbar spondylosis, recent fall Stable symptoms, home health PT Encouraged to use walker  Hyperlipidemia With known CAD have started her on a statin despite her age, Crestor 10 mg     Orders Placed This Encounter  Procedures  . CBC with Differential/Platelet  . CMP14+EGFR  . Home Health    Order Specific Question:   To provide the following care/treatments    Answer:   PT    Order Specific Question:   To provide the following care/treatments    Answer:   RN    Order Specific Question:   To provide the following care/treatments    Answer:   [place]  . Face-to-face encounter (required for Medicare/Medicaid patients)    I Kenn File certify that this patient is under my care and that I, or a nurse practitioner or physician's assistant working with me, had a face-to-face encounter that meets the physician face-to-face encounter requirements with this patient on 10/17/2016. The encounter with the patient was in whole, or in part for the following medical condition(s) which is the primary reason for home health care (List medical condition): Type 2 diabetes, uncontrolled hypertension, coronary artery disease, lumbar spondylosis, recent fall, hyperlipidemia    Scheduling Instructions:     PT  family requesting [facility] care    Order Specific Question:   The encounter with the patient was in whole, or in part, for the following medical condition, which is the primary reason for home health care    Answer:   Type 2 diabetes, uncontrolled hypertension, coronary artery disease, lumbar spondylosis, recent fall, hyperlipidemia    Order Specific Question:   I  certify that, based on my findings, the following services are medically necessary home health services    Answer:   Nursing    Order Specific Question:   I certify that, based on my findings, the following services are medically necessary home health services    Answer:   Physical therapy    Order Specific Question:   Reason for Medically Necessary Home Health Services    Answer:   Skilled Nursing- Skilled Assessment/Observation    Order Specific Question:   Reason for Medically Necessary Home Health Services    Answer:   Therapy- Personnel officer, Adult nurse Specific Question:   My clinical findings support the need for the above services    Answer:   Unable to leave home safely without assistance and/or assistive device    Order Specific Question:   Further, I certify that my clinical findings support that this patient is homebound due to:    Answer:   Unable to leave home safely without assistance    Meds ordered this encounter  Medications  . DULoxetine (CYMBALTA) 30 MG capsule    Sig: Take 30 mg by mouth daily.  . cloNIDine (CATAPRES) 0.1 MG tablet    Sig: Take 1 tablet (0.1 mg total) by mouth 3 (three) times daily.    Dispense:  90 tablet    Refill:  3  . Insulin Glargine (LANTUS) 100 UNIT/ML Solostar Pen    Sig: Inject 20-30 Units into the skin daily at 10 pm.    Dispense:  15 mL    Refill:  5  . rosuvastatin (CRESTOR) 10 MG tablet    Sig: Take 1 tablet (10 mg total) by mouth daily.    Dispense:  30 tablet    Refill:  Budd Lake, MD [facility] 10/17/2016, 11:01 AM

## 2016-10-17 NOTE — Patient Instructions (Signed)
Great to see you!  Start lantus, stop NPH, 20 units per day.   Start clonidine 1 pill 3 times daily for blood pressure  Start crestor for cholesterol  Come back in 2 weeks

## 2016-10-18 ENCOUNTER — Other Ambulatory Visit: Payer: Self-pay | Admitting: *Deleted

## 2016-10-18 ENCOUNTER — Other Ambulatory Visit: Payer: Self-pay

## 2016-10-18 DIAGNOSIS — Z7982 Long term (current) use of aspirin: Secondary | ICD-10-CM | POA: Diagnosis not present

## 2016-10-18 DIAGNOSIS — E875 Hyperkalemia: Secondary | ICD-10-CM

## 2016-10-18 DIAGNOSIS — I2510 Atherosclerotic heart disease of native coronary artery without angina pectoris: Secondary | ICD-10-CM | POA: Diagnosis not present

## 2016-10-18 DIAGNOSIS — Z794 Long term (current) use of insulin: Secondary | ICD-10-CM | POA: Diagnosis not present

## 2016-10-18 DIAGNOSIS — I350 Nonrheumatic aortic (valve) stenosis: Secondary | ICD-10-CM | POA: Diagnosis not present

## 2016-10-18 DIAGNOSIS — E1122 Type 2 diabetes mellitus with diabetic chronic kidney disease: Secondary | ICD-10-CM | POA: Diagnosis not present

## 2016-10-18 DIAGNOSIS — I129 Hypertensive chronic kidney disease with stage 1 through stage 4 chronic kidney disease, or unspecified chronic kidney disease: Secondary | ICD-10-CM | POA: Diagnosis not present

## 2016-10-18 DIAGNOSIS — N184 Chronic kidney disease, stage 4 (severe): Secondary | ICD-10-CM | POA: Diagnosis not present

## 2016-10-18 DIAGNOSIS — D638 Anemia in other chronic diseases classified elsewhere: Secondary | ICD-10-CM | POA: Diagnosis not present

## 2016-10-18 DIAGNOSIS — I209 Angina pectoris, unspecified: Secondary | ICD-10-CM | POA: Diagnosis not present

## 2016-10-18 DIAGNOSIS — E785 Hyperlipidemia, unspecified: Secondary | ICD-10-CM | POA: Diagnosis not present

## 2016-10-18 LAB — CBC WITH DIFFERENTIAL/PLATELET
Basophils Absolute: 0 10*3/uL (ref 0.0–0.2)
Basos: 1 %
EOS (ABSOLUTE): 0.2 10*3/uL (ref 0.0–0.4)
EOS: 5 %
HEMATOCRIT: 28.3 % — AB (ref 34.0–46.6)
HEMOGLOBIN: 9.7 g/dL — AB (ref 11.1–15.9)
IMMATURE GRANS (ABS): 0 10*3/uL (ref 0.0–0.1)
IMMATURE GRANULOCYTES: 0 %
LYMPHS: 31 %
Lymphocytes Absolute: 1.3 10*3/uL (ref 0.7–3.1)
MCH: 30.1 pg (ref 26.6–33.0)
MCHC: 34.3 g/dL (ref 31.5–35.7)
MCV: 88 fL (ref 79–97)
MONOCYTES: 10 %
Monocytes Absolute: 0.4 10*3/uL (ref 0.1–0.9)
NEUTROS PCT: 53 %
Neutrophils Absolute: 2.2 10*3/uL (ref 1.4–7.0)
Platelets: 232 10*3/uL (ref 150–379)
RBC: 3.22 x10E6/uL — AB (ref 3.77–5.28)
RDW: 14.7 % (ref 12.3–15.4)
WBC: 4.1 10*3/uL (ref 3.4–10.8)

## 2016-10-18 LAB — CMP14+EGFR
ALBUMIN: 4 g/dL (ref 3.5–4.7)
ALT: 6 IU/L (ref 0–32)
AST: 6 IU/L (ref 0–40)
Albumin/Globulin Ratio: 1.9 (ref 1.2–2.2)
Alkaline Phosphatase: 51 IU/L (ref 39–117)
BUN/Creatinine Ratio: 23 (ref 12–28)
BUN: 62 mg/dL — ABNORMAL HIGH (ref 8–27)
Bilirubin Total: 0.2 mg/dL (ref 0.0–1.2)
CALCIUM: 10 mg/dL (ref 8.7–10.3)
CO2: 22 mmol/L (ref 18–29)
CREATININE: 2.66 mg/dL — AB (ref 0.57–1.00)
Chloride: 104 mmol/L (ref 96–106)
GFR calc Af Amer: 19 mL/min/{1.73_m2} — ABNORMAL LOW (ref >59)
GFR, EST NON AFRICAN AMERICAN: 16 mL/min/{1.73_m2} — AB (ref >59)
GLOBULIN, TOTAL: 2.1 g/dL (ref 1.5–4.5)
Glucose: 113 mg/dL — ABNORMAL HIGH (ref 65–99)
Potassium: 5.8 mmol/L — ABNORMAL HIGH (ref 3.5–5.2)
SODIUM: 140 mmol/L (ref 134–144)
TOTAL PROTEIN: 6.1 g/dL (ref 6.0–8.5)

## 2016-10-18 NOTE — Patient Outreach (Signed)
RN CM called pt for transition of care week 2, spoke with pt, HIPAA verified, pt reports weight yesteray 149 pounds and has not weighed today, CBG today 100, pt reports she saw primary MD Dr. [name] and insulin changed from "3 shots to one shot per day".RN CM reviewed importance of taking insulin as prescribed.  Pt states this is much easier for her. Medication profile is updated. Pt reports she has all medications and taking as prescribed.  [facility] CM Care Plan Problem One   Flowsheet Row Most Recent Value  Care Plan Problem One  HIgh risk for readmission related to disease process/ CHF  Role Documenting the Problem One  Care Management Coordinator  Care Plan for Problem One  Active  [facility] Long Term Goal (31-90 days)  Pt will verbalize better understanding of disease process CHF to facilitate better outcomes and avoid hospital readmission within 90 days.  [facility]N Long Term Goal Start Date  10/11/16  Interventions for Problem One Long Term Goal  RN CM reiterated importance of daily weights with [employer] telemonitoring, importance of attending all follow up MD appointments.     PLAN See pt for home visit next week  Jacqlyn Larsen [facility], [place] Coordinator [phone]

## 2016-10-21 ENCOUNTER — Other Ambulatory Visit: Payer: Self-pay | Admitting: *Deleted

## 2016-10-21 DIAGNOSIS — N184 Chronic kidney disease, stage 4 (severe): Secondary | ICD-10-CM | POA: Diagnosis not present

## 2016-10-21 DIAGNOSIS — Z794 Long term (current) use of insulin: Secondary | ICD-10-CM | POA: Diagnosis not present

## 2016-10-21 DIAGNOSIS — E785 Hyperlipidemia, unspecified: Secondary | ICD-10-CM | POA: Diagnosis not present

## 2016-10-21 DIAGNOSIS — E1122 Type 2 diabetes mellitus with diabetic chronic kidney disease: Secondary | ICD-10-CM | POA: Diagnosis not present

## 2016-10-21 DIAGNOSIS — I129 Hypertensive chronic kidney disease with stage 1 through stage 4 chronic kidney disease, or unspecified chronic kidney disease: Secondary | ICD-10-CM | POA: Diagnosis not present

## 2016-10-21 DIAGNOSIS — I350 Nonrheumatic aortic (valve) stenosis: Secondary | ICD-10-CM | POA: Diagnosis not present

## 2016-10-21 DIAGNOSIS — Z7982 Long term (current) use of aspirin: Secondary | ICD-10-CM | POA: Diagnosis not present

## 2016-10-21 DIAGNOSIS — I2510 Atherosclerotic heart disease of native coronary artery without angina pectoris: Secondary | ICD-10-CM | POA: Diagnosis not present

## 2016-10-21 DIAGNOSIS — D638 Anemia in other chronic diseases classified elsewhere: Secondary | ICD-10-CM | POA: Diagnosis not present

## 2016-10-21 DIAGNOSIS — I209 Angina pectoris, unspecified: Secondary | ICD-10-CM | POA: Diagnosis not present

## 2016-10-21 MED ORDER — FUROSEMIDE 40 MG PO TABS: 40.0000 mg | ORAL_TABLET | Freq: Every day | ORAL | 5 refills | Status: DC

## 2016-10-25 ENCOUNTER — Encounter: Payer: Self-pay | Admitting: *Deleted

## 2016-10-25 ENCOUNTER — Other Ambulatory Visit: Payer: Self-pay | Admitting: *Deleted

## 2016-10-25 ENCOUNTER — Telehealth: Payer: Self-pay | Admitting: *Deleted

## 2016-10-25 NOTE — Telephone Encounter (Signed)
Shelby Larsen, RN with [facility] is seeing [name] at her home. She noted at her visit today that [name] pulse ranged from 44 to 48 at multiple sites. She is going to have her daughter, who is a CNA, rck her pulse to see if this is an isolated event.  She is taking metoprolol 25mg  daily and has taken it this morning. Would you want to consider medication adjustment with this one episode or do you want to see how the other readings are first?  Can contact [name] with any questions. Otherwise call patient back with instructions. Her daughter, [name], prepares her medication box but she isn't listed on the DPR and I don't see a phone number for her. I tried to call the patient back to find out if we can speak with her daughter but I didn't get an answer.

## 2016-10-25 NOTE — Patient Outreach (Signed)
[facility]) Care Management   10/25/2016  Eileen Galvao [place] Oct 15, 1932 [id number]  [name] is an 82 y.o. female  Subjective: Routine home visit with pt, HIPAA verified, pt reports her daughter assists her daily and gives insulin injection at hs, prefills med box. Assists with grocery shopping and all aspects of care.  Pt unable to drive at present and has a car but does plan to drive again the future. Pt is telemonitored through [facility] and weighs most days but occasionally forgets, pt does not record weight daily. Pt states she checks CBG 3-4 times daily and "diabetes is fine, no problems with that right now"  Pt admits she does have some forgetfulness but daughter assisting has allowed pt to live in her own home alone.  Objective:   Vitals:   10/25/16 1211  BP: 136/64  Pulse: (!) 44  Resp: 18  SpO2: 98%  Weight: 149 lb (67.6 kg)  Height: 1.626 m (5\' 4" )  CBG today 88 7 day average 137 14 day average 135 30 day average 145 ROS  Physical Exam  Constitutional: She is oriented to person, place, and time. She appears well-developed and well-nourished.  HENT:  Head: Normocephalic.  Neck: Normal range of motion. Neck supple.  Cardiovascular:  Heart rate 44 murmur  Respiratory: Effort normal and breath sounds normal.  GI: Soft. Bowel sounds are normal.  Musculoskeletal: She exhibits edema.  2-3+ edema right lower extremity 2+ edema left extremity  Neurological: She is alert and oriented to person, place, and time.  forgetful  Skin: Skin is warm and dry.  Psychiatric: She has a normal mood and affect. Her behavior is normal. Thought content normal.    Encounter Medications:   Outpatient Encounter Prescriptions as of 10/25/2016  Medication Sig  . ACCU-CHEK AVIVA PLUS test strip CHECK BLOOD SUGER UP TO 3 TIMES A DAY  . amLODipine (NORVASC) 10 MG tablet Take 1 tablet (10 mg total) by mouth daily.  Marland Kitchen aspirin EC 81 MG EC tablet Take 1 tablet  (81 mg total) by mouth daily.  . cloNIDine (CATAPRES) 0.1 MG tablet Take 1 tablet (0.1 mg total) by mouth 3 (three) times daily.  . diclofenac sodium (VOLTAREN) 1 % GEL Apply 4 g topically 4 (four) times daily.  . DULoxetine (CYMBALTA) 30 MG capsule Take 30 mg by mouth daily.  . furosemide (LASIX) 40 MG tablet Take 1 tablet (40 mg total) by mouth daily.  . Insulin Glargine (LANTUS) 100 UNIT/ML Solostar Pen Inject 20-30 Units into the skin daily at 10 pm.  . isosorbide mononitrate (IMDUR) 120 MG 24 hr tablet TAKE 1 TABLET DAILY  . levothyroxine (SYNTHROID, LEVOTHROID) 50 MCG tablet Take 1 tablet (50 mcg total) by mouth daily.  Marland Kitchen lisinopril (PRINIVIL,ZESTRIL) 40 MG tablet Take 1 tablet (40 mg total) by mouth daily. (Patient taking differently: Take 20 mg by mouth daily. )  . metoprolol succinate (TOPROL-XL) 25 MG 24 hr tablet Take 1 tablet (25 mg total) by mouth daily.  . nitroGLYCERIN (NITROSTAT) 0.4 MG SL tablet Place 1 tablet (0.4 mg total) under the tongue every 5 (five) minutes x 3 doses as needed for chest pain.  . rosuvastatin (CRESTOR) 10 MG tablet Take 1 tablet (10 mg total) by mouth daily.   No facility-administered encounter medications on file as of 10/25/2016.     Functional Status:   In your present state of health, do you have any difficulty performing the following activities: 10/25/2016 10/06/2016  Hearing?  N -  Vision? N -  Difficulty concentrating or making decisions? Y -  Walking or climbing stairs? Y -  Dressing or bathing? N -  Doing errands, shopping? Y N  Preparing Food and eating ? N -  Using the Toilet? N -  In the past six months, have you accidently leaked urine? N -  Do you have problems with loss of bowel control? N -  Managing your Medications? Y -  Managing your Finances? N -  Housekeeping or managing your Housekeeping? Y -  Some recent data might be hidden    Fall/Depression Screening:    PHQ 2/9 Scores 10/25/2016 10/17/2016 08/22/2016 08/10/2016  07/29/2016 07/20/2016 07/14/2016  PHQ - 2 Score 0 0 0 0 0 0 0  PHQ- 9 Score - - - - - - -   Fall Risk  10/25/2016 10/17/2016 08/22/2016 08/10/2016 07/29/2016  Falls in the past year? Yes Yes No No No  Number falls in past yr: 1 1 - - -  Injury with Fall? No No - - -  Risk Factor Category  - - - - -  Risk for fall due to : History of fall(s);Impaired balance/gait - - - -  Risk for fall due to (comments): - - - - -  Follow up Falls evaluation completed;Education provided;Falls prevention discussed - - - -    Assessment:  RN CM called primary MD Dr. [name] and spoke with triage RN Chong Sicilian, reported HR today 10 and ask Kristen to call with any medication changes to patient's daughter [name] because she provides oversight for medications.  RN CM called patient's daughter [name] to provide update and no answer to telephone and no option to leave voicemail. RN CM reviewed safety precautions with pt, keeping pathways clear.  Pt able to get in shower, does not have shower seat and does not feel she needs one.  RN CM faxed barrier letter, initial home visit to primary MD Dr. [name].  Plan: follow up on HR Continue weekly transition of care calls See pt for home visit in 2 weeks- December  Jacqlyn Larsen [facility], [place] Coordinator [phone]

## 2016-10-25 NOTE — Telephone Encounter (Signed)
OK to continue for now with serious CAD and recent HYpertensive crisis. If she is symptomatic can hold and f/u right away ( dizziness, weakness, lightheadedness.)  Laroy Apple, MD [facility] 10/25/2016, 3:45 PM

## 2016-10-26 DIAGNOSIS — I350 Nonrheumatic aortic (valve) stenosis: Secondary | ICD-10-CM | POA: Diagnosis not present

## 2016-10-26 DIAGNOSIS — E785 Hyperlipidemia, unspecified: Secondary | ICD-10-CM | POA: Diagnosis not present

## 2016-10-26 DIAGNOSIS — E1122 Type 2 diabetes mellitus with diabetic chronic kidney disease: Secondary | ICD-10-CM | POA: Diagnosis not present

## 2016-10-26 DIAGNOSIS — I129 Hypertensive chronic kidney disease with stage 1 through stage 4 chronic kidney disease, or unspecified chronic kidney disease: Secondary | ICD-10-CM | POA: Diagnosis not present

## 2016-10-26 DIAGNOSIS — I2510 Atherosclerotic heart disease of native coronary artery without angina pectoris: Secondary | ICD-10-CM | POA: Diagnosis not present

## 2016-10-26 DIAGNOSIS — D638 Anemia in other chronic diseases classified elsewhere: Secondary | ICD-10-CM | POA: Diagnosis not present

## 2016-10-26 DIAGNOSIS — Z7982 Long term (current) use of aspirin: Secondary | ICD-10-CM | POA: Diagnosis not present

## 2016-10-26 DIAGNOSIS — Z794 Long term (current) use of insulin: Secondary | ICD-10-CM | POA: Diagnosis not present

## 2016-10-26 DIAGNOSIS — I209 Angina pectoris, unspecified: Secondary | ICD-10-CM | POA: Diagnosis not present

## 2016-10-26 DIAGNOSIS — N184 Chronic kidney disease, stage 4 (severe): Secondary | ICD-10-CM | POA: Diagnosis not present

## 2016-10-27 ENCOUNTER — Ambulatory Visit (INDEPENDENT_AMBULATORY_CARE_PROVIDER_SITE_OTHER): Payer: Medicare Other | Admitting: Family Medicine

## 2016-10-27 ENCOUNTER — Encounter: Payer: Self-pay | Admitting: Family Medicine

## 2016-10-27 ENCOUNTER — Encounter (INDEPENDENT_AMBULATORY_CARE_PROVIDER_SITE_OTHER): Payer: Self-pay

## 2016-10-27 VITALS — BP 168/63 | HR 55 | Temp 97.6°F | Ht 64.0 in | Wt 149.4 lb

## 2016-10-27 DIAGNOSIS — E875 Hyperkalemia: Secondary | ICD-10-CM

## 2016-10-27 DIAGNOSIS — I10 Essential (primary) hypertension: Secondary | ICD-10-CM

## 2016-10-27 DIAGNOSIS — Z794 Long term (current) use of insulin: Secondary | ICD-10-CM | POA: Diagnosis not present

## 2016-10-27 DIAGNOSIS — R001 Bradycardia, unspecified: Secondary | ICD-10-CM | POA: Diagnosis not present

## 2016-10-27 DIAGNOSIS — E118 Type 2 diabetes mellitus with unspecified complications: Secondary | ICD-10-CM | POA: Diagnosis not present

## 2016-10-27 DIAGNOSIS — IMO0001 Reserved for inherently not codable concepts without codable children: Secondary | ICD-10-CM

## 2016-10-27 MED ORDER — METOPROLOL SUCCINATE ER 25 MG PO TB24: 12.5000 mg | ORAL_TABLET | Freq: Every day | ORAL | 6 refills | Status: DC

## 2016-10-27 MED ORDER — CLONIDINE HCL 0.2 MG PO TABS: 0.2000 mg | ORAL_TABLET | Freq: Three times a day (TID) | ORAL | 3 refills | Status: DC

## 2016-10-27 NOTE — Progress Notes (Signed)
   HPI  Patient presents today for follow-up.  Patient was recently hospitalized within NSTEMI, likely due to hypertensive emergency. Last visit we changed insulin due to hypoglycemia, added clonidine  Recently we received a call with concerns for bradycardia She has beenin the 40s on a few recent checks, Home health RN measuring and then her daughter who is a CNA  She denies chest pain  She is having some mild leg swelling that is completely resolved before morning when she wakes  T2DM- Lantus doing well, taking 15 units daily, fasting 80-100 No hypoglycemia Several readings 250-300 in afternoon.  Reports eating "good"   Not checking blood pressure at home, no problem with TID clonidine, reports easy compliance  PMH: Smoking status noted ROS: Per HPI  Objective: BP (!) 168/63   Pulse (!) 55   Temp 97.6 F (36.4 C) (Oral)   Ht 5\' 4"  (1.626 m)   Wt 149 lb 6.4 oz (67.8 kg)   BMI 25.64 kg/m  Gen: NAD, alert, cooperative with exam HEENT: NCAT CV: RRR, good S1/S2, no murmur Resp: CTABL, no wheezes, non-labored Ext: No edema, warm Neuro: Alert and oriented, No gross deficits  Assessment and plan:  # HTN Elevated today, still elevated with resting 20 min or so Increase clonidine, 0.2 mg TID Decrease BB- bradycardia Continue lisinopril, Imdur, amlodipine  # Bradycardia Sinus bradycardia with first-degree heart block Decrease metoprolol 12.5 mg, I think a beta blocker is very important for her due to severe CAD   # Insulin-dependent diabetes Control fasting sugars with no hypoglycemia, however she has afternoon hyperglycemia. Think it's likely that this is postprandial in nature, recommended 2 week follow-up with clinical pharmacist to get 24-hour glucose monitor placed Consider starting mealtime insulin  # Elevated potassium, chronic kidney disease Repeating labs, consider starting Kayexalate if persistently elevated versus very quick follow-up with  nephrology    Orders Placed This Encounter  Procedures  . EKG 12-Lead    Meds ordered this encounter  Medications  . metoprolol succinate (TOPROL-XL) 25 MG 24 hr tablet    Sig: Take 0.5 tablets (12.5 mg total) by mouth daily.    Dispense:  30 tablet    Refill:  6  . cloNIDine (CATAPRES) 0.2 MG tablet    Sig: Take 1 tablet (0.2 mg total) by mouth 3 (three) times daily.    Dispense:  90 tablet    Refill:  [place], MD [place] 10/27/2016, 5:28 PM

## 2016-10-27 NOTE — Patient Instructions (Signed)
Great to see you!  Lets have you see [name], [facility] clinical pharmacist, for diabetes in the next 2 weeks Continue 15 units of lantus  Cut metoprolol in half Increase clonidine to 0.2 mg three times daily , I have sent a new prescription. ( you may take 2 pills three times daily fo what you have if you would like until it runs out)  Come back in 1 month

## 2016-10-28 ENCOUNTER — Ambulatory Visit: Payer: Medicare Other | Admitting: Physician Assistant

## 2016-10-28 LAB — BMP8+EGFR
BUN/Creatinine Ratio: 19 (ref 12–28)
BUN: 56 mg/dL — AB (ref 8–27)
CALCIUM: 9.7 mg/dL (ref 8.7–10.3)
CHLORIDE: 102 mmol/L (ref 96–106)
CO2: 24 mmol/L (ref 18–29)
Creatinine, Ser: 2.93 mg/dL — ABNORMAL HIGH (ref 0.57–1.00)
GFR, EST AFRICAN AMERICAN: 17 mL/min/{1.73_m2} — AB (ref >59)
GFR, EST NON AFRICAN AMERICAN: 14 mL/min/{1.73_m2} — AB (ref >59)
Glucose: 262 mg/dL — ABNORMAL HIGH (ref 65–99)
Potassium: 5.7 mmol/L — ABNORMAL HIGH (ref 3.5–5.2)
Sodium: 141 mmol/L (ref 134–144)

## 2016-10-31 ENCOUNTER — Other Ambulatory Visit: Payer: Self-pay | Admitting: *Deleted

## 2016-10-31 ENCOUNTER — Ambulatory Visit: Payer: Medicare Other | Admitting: Family Medicine

## 2016-10-31 NOTE — Patient Outreach (Signed)
Telephone call to patient for transition of care week 4, no answer to telephone and no option to leave voicemail.  Jacqlyn Larsen [facility], [place] Coordinator [phone]

## 2016-11-04 ENCOUNTER — Telehealth: Payer: Self-pay | Admitting: *Deleted

## 2016-11-04 NOTE — Telephone Encounter (Signed)
Pt complaining of excessive sleepiness Wondered if medication was the cause Offered appt for evaluation Pt declined appt Any recommendaiton?

## 2016-11-07 NOTE — Telephone Encounter (Signed)
Called pt with Dr Alen Bleacher recommendation Pt verbalizes understanding Pt also states her feet are swelling appt scheduled

## 2016-11-07 NOTE — Telephone Encounter (Signed)
Could be due to clonidine. Would recommend trying the 0.1 mg three times a day ( previous Rx) to see if it makes a differene.   I agree in general she should be seen for this.   Laroy Apple, MD [facility] 11/07/2016, 12:53 PM

## 2016-11-08 ENCOUNTER — Encounter: Payer: Self-pay | Admitting: Family Medicine

## 2016-11-08 ENCOUNTER — Ambulatory Visit (INDEPENDENT_AMBULATORY_CARE_PROVIDER_SITE_OTHER): Payer: Medicare Other | Admitting: Family Medicine

## 2016-11-08 VITALS — BP 141/56 | HR 53 | Temp 97.4°F | Ht 64.0 in | Wt 150.6 lb

## 2016-11-08 DIAGNOSIS — M545 Low back pain, unspecified: Secondary | ICD-10-CM

## 2016-11-08 DIAGNOSIS — I10 Essential (primary) hypertension: Secondary | ICD-10-CM | POA: Diagnosis not present

## 2016-11-08 DIAGNOSIS — N184 Chronic kidney disease, stage 4 (severe): Secondary | ICD-10-CM | POA: Diagnosis not present

## 2016-11-08 DIAGNOSIS — M7989 Other specified soft tissue disorders: Secondary | ICD-10-CM | POA: Diagnosis not present

## 2016-11-08 MED ORDER — TORSEMIDE 20 MG PO TABS: 20.0000 mg | ORAL_TABLET | Freq: Every day | ORAL | 1 refills | Status: DC

## 2016-11-08 NOTE — Patient Instructions (Signed)
For you rback:  Try 1 tylenol 3 times daily  Stop lasix, I am re[placing it with toresemide  Cut metoprolol in half

## 2016-11-08 NOTE — Progress Notes (Signed)
   HPI  Patient presents today here with leg swelling, follow-up for hypertensionand for chronic kidney disease  Hypertension Has changed to 0.2 mg clonidine to 3 times daily Has not reduced her metoprolol to one half pill. States that she's been more sleepy than usual  Leg swelling Patient reports bilateral leg swellings been gone on for about one to 2 days. Denies any increased salt intake or increased fluid intake States that she forgot that she took her Lasix a few times and ended up taking 8 pills of Lasix and 1 day with no good diuresis.  Low back pain Started last night, described as left-sided low back pain worse with standing, eases off whenever she lays down or sits for a little while. Has not tried any medications.   PMH: Smoking status noted ROS: Per HPI  Objective: BP (!) 141/56   Pulse (!) 53   Temp 97.4 F (36.3 C) (Oral)   Ht 5\' 4"  (1.626 m)   Wt 150 lb 9.6 oz (68.3 kg)   BMI 25.85 kg/m  Gen: NAD, alert, cooperative with exam HEENT: NCAT, EOMI, PERRL CV: brady Resp: CTABL, no wheezes, non-labored, no crackles Abd: SNTND, BS present, no guarding or organomegaly Ext: 2+ R>L LE edema Neuro: Alert and oriented, No gross deficits  Assessment and plan:  # Left-sided low back pain without sciatica No red flags Recommend Tylenol, ice, heat  # Chronic kidney disease stage IV Repeating labs, could be the underlying etiology behind her leg swelling.  # Leg swelling With severe chronic renal disease Changing Lasix to torsemide, although I'm suspicious that she has had a decline in her renal function.  # Hypertension Improved Continue current medications I think her current fatigue and somnolence is likely due to adjustment to normal blood pressure.    Laroy Apple, MD [facility] 11/08/2016, 2:03 PM

## 2016-11-09 ENCOUNTER — Other Ambulatory Visit: Payer: Self-pay | Admitting: *Deleted

## 2016-11-09 ENCOUNTER — Encounter: Payer: Self-pay | Admitting: *Deleted

## 2016-11-09 NOTE — Patient Outreach (Signed)
[facility]) Care Management   11/09/2016  [name] September 09, 1932 [id number]  [name] is an 82 y.o. female  Subjective: Routine home visit with pt, HIPAA verified, pt states she went to primary MD yesterday because she has been feeling tired and feet stay swollen.  Pt reports MD made medication changes and pharmacy is delivering new medications today.  Lasix discontinued and added torsemide, metoprolol 25mg  is now 0.5 tablet daily.  Pt states she continues weighing daily but has not been recording but still wants to work towards this goal.  Pt states she continues checking CBG several times daily and her daughter [name] comes at night to check on her and administer insulin injection and provide oversight for medications.  Objective:   Vitals:   11/09/16 1339  BP: 126/62  Pulse: (!) 54  Resp: 16  SpO2: 98%  Weight: 149 lb (67.6 kg)  CBG fasting 85 today. 7 day average 179 14 day average 176 30 day average 160 90 day average 171  ROS  Physical Exam  Constitutional: She is oriented to person, place, and time. She appears well-developed and well-nourished.  HENT:  Head: Normocephalic.  Neck: Normal range of motion. Neck supple.  Cardiovascular:  Heart rate 54  Respiratory: Effort normal and breath sounds normal.  GI: Soft. Bowel sounds are normal.  Musculoskeletal: Normal range of motion. She exhibits edema.  3+ edema right lower extremity 2+ edema left lower extremity  Neurological: She is alert and oriented to person, place, and time.  Pt forgetful  Skin: Skin is warm and dry.  Psychiatric: She has a normal mood and affect. Her behavior is normal.    Encounter Medications:   Outpatient Encounter Prescriptions as of 11/09/2016  Medication Sig Note  . ACCU-CHEK AVIVA PLUS test strip CHECK BLOOD SUGER UP TO 3 TIMES A DAY   . amLODipine (NORVASC) 10 MG tablet Take 1 tablet (10 mg total) by mouth daily.   Marland Kitchen aspirin EC 81 MG EC tablet Take 1  tablet (81 mg total) by mouth daily.   . cloNIDine (CATAPRES) 0.2 MG tablet Take 1 tablet (0.2 mg total) by mouth 3 (three) times daily.   . diclofenac sodium (VOLTAREN) 1 % GEL Apply 4 g topically 4 (four) times daily.   . Insulin Glargine (LANTUS) 100 UNIT/ML Solostar Pen Inject 20-30 Units into the skin daily at 10 pm.   . isosorbide mononitrate (IMDUR) 120 MG 24 hr tablet TAKE 1 TABLET DAILY   . levothyroxine (SYNTHROID, LEVOTHROID) 50 MCG tablet Take 1 tablet (50 mcg total) by mouth daily.   Marland Kitchen lisinopril (PRINIVIL,ZESTRIL) 40 MG tablet Take 1 tablet (40 mg total) by mouth daily. (Patient taking differently: Take 20 mg by mouth daily. )   . metoprolol succinate (TOPROL-XL) 25 MG 24 hr tablet Take 0.5 tablets (12.5 mg total) by mouth daily. 11/09/2016: Pt has been taking one whole tablet, instructed to take 0.5 tablet  . nitroGLYCERIN (NITROSTAT) 0.4 MG SL tablet Place 1 tablet (0.4 mg total) under the tongue every 5 (five) minutes x 3 doses as needed for chest pain.   . rosuvastatin (CRESTOR) 10 MG tablet Take 1 tablet (10 mg total) by mouth daily.   . DULoxetine (CYMBALTA) 30 MG capsule Take 30 mg by mouth daily.   Marland Kitchen torsemide (DEMADEX) 20 MG tablet Take 1 tablet (20 mg total) by mouth daily. (Patient not taking: Reported on 11/09/2016) 11/09/2016: Pt awaiting prescription to be delivered from pharmacy today  No facility-administered encounter medications on file as of 11/09/2016.     Functional Status:   In your present state of health, do you have any difficulty performing the following activities: 10/25/2016 10/06/2016  Hearing? N -  Vision? N -  Difficulty concentrating or making decisions? Y -  Walking or climbing stairs? Y -  Dressing or bathing? N -  Doing errands, shopping? Y N  Preparing Food and eating ? N -  Using the Toilet? N -  In the past six months, have you accidently leaked urine? N -  Do you have problems with loss of bowel control? N -  Managing your Medications?  Y -  Managing your Finances? N -  Housekeeping or managing your Housekeeping? Y -  Some recent data might be hidden    Fall/Depression Screening:    PHQ 2/9 Scores 11/08/2016 10/25/2016 10/17/2016 08/22/2016 08/10/2016 07/29/2016 07/20/2016  PHQ - 2 Score 0 0 0 0 0 0 0  PHQ- 9 Score - - - - - - -    Assessment:  RN CM reviewed all medications with pt and called [facility] [employer], spoke with Marcie Bal, pharmacy is delivering new prescription for torsemide today and furosemide is discontinued (RN CM wrote STOP on bottle), pharmacy will deliver new prescription for metoprolol 25mg  1/2 tablet daily and will deliver today or tomorrow.  RN CM wrote out instructions about medications for daughter [name] who will see pt today, RN CM unable to reach Veranda on the phone and no option to leave voicemail.  RN CM sent in basket to Dr. [name] with update informing of the above information, HR and edema.  [facility] CM Care Plan Problem One   Flowsheet Row Most Recent Value  Care Plan Problem One  HIgh risk for readmission related to disease process/ CHF  Role Documenting the Problem One  Care Management Coordinator  Care Plan for Problem One  Active  THN Long Term Goal (31-90 days)  Pt will verbalize better understanding of disease process CHF to facilitate better outcomes and avoid hospital readmission within 90 days.  THN Long Term Goal Start Date  10/11/16  Interventions for Problem One Long Term Goal  RN CM reviewed importance of daily weights with [employer] telemonitoring, importance of attending all follow up MD appointments.  THN CM Short Term Goal #1 (0-30 days)  Pt will not readmit to hospital for HF in the next 30 days.  THN CM Short Term Goal #1 Start Date  10/25/16  Interventions for Short Term Goal #1  RN CM reviewed CHF zones/ action plan  THN CM Short Term Goal #2 (0-30 days)  Pt will weigh daily and record within 30 days.  THN CM Short Term Goal #2 Start Date  11/24/16 [goal restarted]   Interventions for Short Term Goal #2  RN CM reinforced importance of weighing every day and keeping log, pt states she will start keeping log., this goal restarted      PLAN Follow up with home visit in January Assess weight, edema, HR, CBG  Jacqlyn Larsen [facility], [place] Coordinator [phone]    Plan:

## 2016-11-17 ENCOUNTER — Encounter (HOSPITAL_COMMUNITY): Payer: Self-pay

## 2016-11-17 ENCOUNTER — Emergency Department (HOSPITAL_COMMUNITY): Payer: Medicare Other

## 2016-11-17 ENCOUNTER — Encounter (HOSPITAL_COMMUNITY)
Admission: EM | Disposition: A | Discharge status: Skilled Nursing Facility | Payer: Self-pay | Source: Home / Self Care | Attending: Cardiovascular Disease

## 2016-11-17 ENCOUNTER — Emergency Department (INDEPENDENT_AMBULATORY_CARE_PROVIDER_SITE_OTHER): Payer: Medicare Other

## 2016-11-17 ENCOUNTER — Inpatient Hospital Stay (HOSPITAL_COMMUNITY)
Admission: EM | Admit: 2016-11-17 | Discharge: 2016-11-29 | DRG: 280 | Disposition: A | Discharge status: Skilled Nursing Facility | Payer: Medicare Other | Attending: Internal Medicine | Admitting: Internal Medicine

## 2016-11-17 DIAGNOSIS — I350 Nonrheumatic aortic (valve) stenosis: Secondary | ICD-10-CM | POA: Diagnosis present

## 2016-11-17 DIAGNOSIS — Z794 Long term (current) use of insulin: Secondary | ICD-10-CM | POA: Diagnosis not present

## 2016-11-17 DIAGNOSIS — E7800 Pure hypercholesterolemia, unspecified: Secondary | ICD-10-CM | POA: Diagnosis present

## 2016-11-17 DIAGNOSIS — I4891 Unspecified atrial fibrillation: Secondary | ICD-10-CM | POA: Diagnosis not present

## 2016-11-17 DIAGNOSIS — N184 Chronic kidney disease, stage 4 (severe): Secondary | ICD-10-CM | POA: Diagnosis not present

## 2016-11-17 DIAGNOSIS — Z7982 Long term (current) use of aspirin: Secondary | ICD-10-CM

## 2016-11-17 DIAGNOSIS — Z803 Family history of malignant neoplasm of breast: Secondary | ICD-10-CM | POA: Diagnosis not present

## 2016-11-17 DIAGNOSIS — G9340 Encephalopathy, unspecified: Secondary | ICD-10-CM | POA: Diagnosis not present

## 2016-11-17 DIAGNOSIS — M47816 Spondylosis without myelopathy or radiculopathy, lumbar region: Secondary | ICD-10-CM | POA: Diagnosis present

## 2016-11-17 DIAGNOSIS — I5033 Acute on chronic diastolic (congestive) heart failure: Secondary | ICD-10-CM | POA: Diagnosis not present

## 2016-11-17 DIAGNOSIS — E875 Hyperkalemia: Secondary | ICD-10-CM | POA: Diagnosis not present

## 2016-11-17 DIAGNOSIS — E139 Other specified diabetes mellitus without complications: Secondary | ICD-10-CM | POA: Diagnosis not present

## 2016-11-17 DIAGNOSIS — R079 Chest pain, unspecified: Secondary | ICD-10-CM

## 2016-11-17 DIAGNOSIS — I130 Hypertensive heart and chronic kidney disease with heart failure and stage 1 through stage 4 chronic kidney disease, or unspecified chronic kidney disease: Secondary | ICD-10-CM | POA: Diagnosis present

## 2016-11-17 DIAGNOSIS — Z66 Do not resuscitate: Secondary | ICD-10-CM | POA: Diagnosis not present

## 2016-11-17 DIAGNOSIS — I5043 Acute on chronic combined systolic (congestive) and diastolic (congestive) heart failure: Secondary | ICD-10-CM | POA: Diagnosis not present

## 2016-11-17 DIAGNOSIS — E1122 Type 2 diabetes mellitus with diabetic chronic kidney disease: Secondary | ICD-10-CM | POA: Diagnosis not present

## 2016-11-17 DIAGNOSIS — I161 Hypertensive emergency: Secondary | ICD-10-CM | POA: Diagnosis not present

## 2016-11-17 DIAGNOSIS — I480 Paroxysmal atrial fibrillation: Secondary | ICD-10-CM | POA: Diagnosis not present

## 2016-11-17 DIAGNOSIS — R195 Other fecal abnormalities: Secondary | ICD-10-CM

## 2016-11-17 DIAGNOSIS — I10 Essential (primary) hypertension: Secondary | ICD-10-CM | POA: Diagnosis not present

## 2016-11-17 DIAGNOSIS — K610 Anal abscess: Secondary | ICD-10-CM | POA: Diagnosis not present

## 2016-11-17 DIAGNOSIS — R0789 Other chest pain: Secondary | ICD-10-CM

## 2016-11-17 DIAGNOSIS — I110 Hypertensive heart disease with heart failure: Secondary | ICD-10-CM | POA: Diagnosis not present

## 2016-11-17 DIAGNOSIS — I200 Unstable angina: Secondary | ICD-10-CM

## 2016-11-17 DIAGNOSIS — Z8673 Personal history of transient ischemic attack (TIA), and cerebral infarction without residual deficits: Secondary | ICD-10-CM

## 2016-11-17 DIAGNOSIS — Z833 Family history of diabetes mellitus: Secondary | ICD-10-CM

## 2016-11-17 DIAGNOSIS — I2510 Atherosclerotic heart disease of native coronary artery without angina pectoris: Secondary | ICD-10-CM

## 2016-11-17 DIAGNOSIS — N390 Urinary tract infection, site not specified: Secondary | ICD-10-CM

## 2016-11-17 DIAGNOSIS — E785 Hyperlipidemia, unspecified: Secondary | ICD-10-CM | POA: Diagnosis present

## 2016-11-17 DIAGNOSIS — IMO0001 Reserved for inherently not codable concepts without codable children: Secondary | ICD-10-CM

## 2016-11-17 DIAGNOSIS — R269 Unspecified abnormalities of gait and mobility: Secondary | ICD-10-CM | POA: Diagnosis not present

## 2016-11-17 DIAGNOSIS — R05 Cough: Secondary | ICD-10-CM | POA: Diagnosis not present

## 2016-11-17 DIAGNOSIS — Z8249 Family history of ischemic heart disease and other diseases of the circulatory system: Secondary | ICD-10-CM

## 2016-11-17 DIAGNOSIS — R339 Retention of urine, unspecified: Secondary | ICD-10-CM

## 2016-11-17 DIAGNOSIS — I214 Non-ST elevation (NSTEMI) myocardial infarction: Principal | ICD-10-CM

## 2016-11-17 DIAGNOSIS — Z888 Allergy status to other drugs, medicaments and biological substances status: Secondary | ICD-10-CM | POA: Diagnosis not present

## 2016-11-17 DIAGNOSIS — D709 Neutropenia, unspecified: Secondary | ICD-10-CM | POA: Diagnosis not present

## 2016-11-17 DIAGNOSIS — I509 Heart failure, unspecified: Secondary | ICD-10-CM | POA: Diagnosis not present

## 2016-11-17 DIAGNOSIS — R262 Difficulty in walking, not elsewhere classified: Secondary | ICD-10-CM

## 2016-11-17 DIAGNOSIS — F0390 Unspecified dementia without behavioral disturbance: Secondary | ICD-10-CM | POA: Diagnosis present

## 2016-11-17 DIAGNOSIS — N182 Chronic kidney disease, stage 2 (mild): Secondary | ICD-10-CM | POA: Diagnosis not present

## 2016-11-17 DIAGNOSIS — L8990 Pressure ulcer of unspecified site, unspecified stage: Secondary | ICD-10-CM | POA: Insufficient documentation

## 2016-11-17 DIAGNOSIS — Z800 Family history of malignant neoplasm of digestive organs: Secondary | ICD-10-CM

## 2016-11-17 DIAGNOSIS — I482 Chronic atrial fibrillation: Secondary | ICD-10-CM | POA: Diagnosis not present

## 2016-11-17 DIAGNOSIS — N179 Acute kidney failure, unspecified: Secondary | ICD-10-CM

## 2016-11-17 DIAGNOSIS — N189 Chronic kidney disease, unspecified: Secondary | ICD-10-CM

## 2016-11-17 DIAGNOSIS — E118 Type 2 diabetes mellitus with unspecified complications: Secondary | ICD-10-CM

## 2016-11-17 DIAGNOSIS — D631 Anemia in chronic kidney disease: Secondary | ICD-10-CM | POA: Diagnosis not present

## 2016-11-17 DIAGNOSIS — Z5320 Procedure and treatment not carried out because of patient's decision for unspecified reasons: Secondary | ICD-10-CM | POA: Diagnosis present

## 2016-11-17 DIAGNOSIS — R509 Fever, unspecified: Secondary | ICD-10-CM

## 2016-11-17 DIAGNOSIS — Y95 Nosocomial condition: Secondary | ICD-10-CM | POA: Diagnosis present

## 2016-11-17 DIAGNOSIS — B373 Candidiasis of vulva and vagina: Secondary | ICD-10-CM | POA: Diagnosis present

## 2016-11-17 DIAGNOSIS — J189 Pneumonia, unspecified organism: Secondary | ICD-10-CM

## 2016-11-17 DIAGNOSIS — I249 Acute ischemic heart disease, unspecified: Secondary | ICD-10-CM | POA: Diagnosis not present

## 2016-11-17 DIAGNOSIS — N1330 Unspecified hydronephrosis: Secondary | ICD-10-CM | POA: Diagnosis not present

## 2016-11-17 DIAGNOSIS — N183 Chronic kidney disease, stage 3 (moderate): Secondary | ICD-10-CM | POA: Diagnosis not present

## 2016-11-17 DIAGNOSIS — N898 Other specified noninflammatory disorders of vagina: Secondary | ICD-10-CM | POA: Diagnosis not present

## 2016-11-17 DIAGNOSIS — Z9861 Coronary angioplasty status: Secondary | ICD-10-CM

## 2016-11-17 LAB — BASIC METABOLIC PANEL
Anion gap: 10 (ref 5–15)
BUN: 45 mg/dL — AB (ref 6–20)
CHLORIDE: 107 mmol/L (ref 101–111)
CO2: 21 mmol/L — AB (ref 22–32)
Calcium: 10.4 mg/dL — ABNORMAL HIGH (ref 8.9–10.3)
Creatinine, Ser: 3.12 mg/dL — ABNORMAL HIGH (ref 0.44–1.00)
GFR calc Af Amer: 15 mL/min — ABNORMAL LOW (ref >60)
GFR calc non Af Amer: 13 mL/min — ABNORMAL LOW (ref >60)
GLUCOSE: 169 mg/dL — AB (ref 65–99)
POTASSIUM: 5.7 mmol/L — AB (ref 3.5–5.1)
Sodium: 138 mmol/L (ref 135–145)

## 2016-11-17 LAB — LIPASE, BLOOD: LIPASE: 31 U/L (ref 11–51)

## 2016-11-17 LAB — I-STAT TROPONIN, ED: Troponin i, poc: 0.02 ng/mL (ref 0.00–0.08)

## 2016-11-17 LAB — CBC
HEMATOCRIT: 29 % — AB (ref 36.0–46.0)
HEMOGLOBIN: 9.5 g/dL — AB (ref 12.0–15.0)
MCH: 30.2 pg (ref 26.0–34.0)
MCHC: 32.8 g/dL (ref 30.0–36.0)
MCV: 92.1 fL (ref 78.0–100.0)
Platelets: 203 10*3/uL (ref 150–400)
RBC: 3.15 MIL/uL — ABNORMAL LOW (ref 3.87–5.11)
RDW: 13.2 % (ref 11.5–15.5)
WBC: 6.7 10*3/uL (ref 4.0–10.5)

## 2016-11-17 LAB — TROPONIN I: TROPONIN I: 0.05 ng/mL — AB (ref <0.03)

## 2016-11-17 LAB — HEPATIC FUNCTION PANEL
ALBUMIN: 4.1 g/dL (ref 3.5–5.0)
ALK PHOS: 46 U/L (ref 38–126)
ALT: 9 U/L — ABNORMAL LOW (ref 14–54)
AST: 13 U/L — ABNORMAL LOW (ref 15–41)
BILIRUBIN TOTAL: 0.6 mg/dL (ref 0.3–1.2)
Total Protein: 7.1 g/dL (ref 6.5–8.1)

## 2016-11-17 LAB — GLUCOSE, CAPILLARY
Glucose-Capillary: 192 mg/dL — ABNORMAL HIGH (ref 65–99)
Glucose-Capillary: 188 mg/dL — ABNORMAL HIGH (ref 65–99)

## 2016-11-17 LAB — MRSA PCR SCREENING: MRSA by PCR: NEGATIVE

## 2016-11-17 LAB — ECHOCARDIOGRAM COMPLETE

## 2016-11-17 LAB — BRAIN NATRIURETIC PEPTIDE: B NATRIURETIC PEPTIDE 5: 551.1 pg/mL — AB (ref 0.0–100.0)

## 2016-11-17 SURGERY — LEFT HEART CATH AND CORONARY ANGIOGRAPHY
Anesthesia: LOCAL

## 2016-11-17 MED ORDER — ALPRAZOLAM 0.25 MG PO TABS: 0.2500 mg | ORAL_TABLET | Freq: Two times a day (BID) | ORAL | Status: DC | PRN

## 2016-11-17 MED ORDER — METOPROLOL SUCCINATE ER 25 MG PO TB24
12.5000 mg | ORAL_TABLET | Freq: Every day | ORAL | Status: DC
  Administered 2016-11-18: 12.5 mg via ORAL
  Filled 2016-11-17: qty 1

## 2016-11-17 MED ORDER — DICLOFENAC SODIUM 1 % TD GEL
4.0000 g | Freq: Four times a day (QID) | TRANSDERMAL | Status: DC
  Administered 2016-11-18 – 2016-11-21 (×3): 4 g via TOPICAL
  Filled 2016-11-17: qty 100

## 2016-11-17 MED ORDER — NITROGLYCERIN IN D5W 200-5 MCG/ML-% IV SOLN
2.0000 ug/min | INTRAVENOUS | Status: DC
  Administered 2016-11-17: 10 ug/min via INTRAVENOUS
  Administered 2016-11-18: 50 ug/min via INTRAVENOUS
  Administered 2016-11-18: 80 ug/min via INTRAVENOUS
  Filled 2016-11-17 (×4): qty 250

## 2016-11-17 MED ORDER — ZOLPIDEM TARTRATE 5 MG PO TABS: 5.0000 mg | ORAL_TABLET | Freq: Every evening | ORAL | Status: DC | PRN

## 2016-11-17 MED ORDER — IBUPROFEN 400 MG PO TABS
400.0000 mg | ORAL_TABLET | Freq: Once | ORAL | Status: AC
  Administered 2016-11-17: 400 mg via ORAL

## 2016-11-17 MED ORDER — ASPIRIN 81 MG PO CHEW: 324.0000 mg | CHEWABLE_TABLET | Freq: Once | ORAL | Status: DC

## 2016-11-17 MED ORDER — SODIUM CHLORIDE 0.9 % IV SOLN: 250.0000 mL | INTRAVENOUS | Status: DC | PRN

## 2016-11-17 MED ORDER — METOPROLOL TARTRATE 5 MG/5ML IV SOLN
5.0000 mg | Freq: Once | INTRAVENOUS | Status: AC
  Administered 2016-11-17: 5 mg via INTRAVENOUS
  Filled 2016-11-17: qty 5

## 2016-11-17 MED ORDER — FUROSEMIDE 10 MG/ML IJ SOLN
60.0000 mg | Freq: Once | INTRAMUSCULAR | Status: AC
  Administered 2016-11-17: 60 mg via INTRAVENOUS
  Filled 2016-11-17: qty 6

## 2016-11-17 MED ORDER — VITAMIN D 1000 UNITS PO TABS
1000.0000 [IU] | ORAL_TABLET | Freq: Every day | ORAL | Status: DC
  Administered 2016-11-18 – 2016-11-29 (×12): 1000 [IU] via ORAL
  Filled 2016-11-17 (×13): qty 1

## 2016-11-17 MED ORDER — AMLODIPINE BESYLATE 10 MG PO TABS
10.0000 mg | ORAL_TABLET | Freq: Every day | ORAL | Status: DC
  Administered 2016-11-18 – 2016-11-27 (×10): 10 mg via ORAL
  Filled 2016-11-17 (×11): qty 1

## 2016-11-17 MED ORDER — MORPHINE SULFATE (PF) 4 MG/ML IV SOLN
2.0000 mg | INTRAVENOUS | Status: DC | PRN
  Administered 2016-11-17 – 2016-11-20 (×7): 4 mg via INTRAVENOUS
  Filled 2016-11-17 (×7): qty 1

## 2016-11-17 MED ORDER — MORPHINE SULFATE (PF) 4 MG/ML IV SOLN
2.0000 mg | Freq: Once | INTRAVENOUS | Status: AC
Start: 2016-11-17 — End: 2016-11-17
  Administered 2016-11-17: 2 mg via INTRAVENOUS
  Filled 2016-11-17: qty 1

## 2016-11-17 MED ORDER — NITROGLYCERIN 0.4 MG SL SUBL
SUBLINGUAL_TABLET | SUBLINGUAL | Status: AC
  Administered 2016-11-17: 0.4 mg
  Filled 2016-11-17: qty 1

## 2016-11-17 MED ORDER — ONDANSETRON HCL 4 MG/2ML IJ SOLN
4.0000 mg | Freq: Four times a day (QID) | INTRAMUSCULAR | Status: DC | PRN
  Administered 2016-11-18 – 2016-11-25 (×2): 4 mg via INTRAVENOUS
  Filled 2016-11-17 (×2): qty 2

## 2016-11-17 MED ORDER — SODIUM CHLORIDE 0.9% FLUSH
3.0000 mL | Freq: Two times a day (BID) | INTRAVENOUS | Status: DC
  Administered 2016-11-18 – 2016-11-22 (×9): 3 mL via INTRAVENOUS

## 2016-11-17 MED ORDER — INSULIN ASPART 100 UNIT/ML ~~LOC~~ SOLN
0.0000 [IU] | Freq: Three times a day (TID) | SUBCUTANEOUS | Status: DC
  Administered 2016-11-18 (×2): 3 [IU] via SUBCUTANEOUS
  Administered 2016-11-19: 2 [IU] via SUBCUTANEOUS
  Administered 2016-11-20: 3 [IU] via SUBCUTANEOUS
  Administered 2016-11-20 (×2): 2 [IU] via SUBCUTANEOUS
  Administered 2016-11-21: 5 [IU] via SUBCUTANEOUS
  Administered 2016-11-21: 3 [IU] via SUBCUTANEOUS
  Administered 2016-11-22: 5 [IU] via SUBCUTANEOUS
  Administered 2016-11-22 (×2): 3 [IU] via SUBCUTANEOUS
  Administered 2016-11-23: 5 [IU] via SUBCUTANEOUS
  Administered 2016-11-23 (×2): 3 [IU] via SUBCUTANEOUS
  Administered 2016-11-24 (×2): 5 [IU] via SUBCUTANEOUS
  Administered 2016-11-24 – 2016-11-25 (×2): 3 [IU] via SUBCUTANEOUS
  Administered 2016-11-25: 5 [IU] via SUBCUTANEOUS
  Administered 2016-11-25 – 2016-11-26 (×2): 3 [IU] via SUBCUTANEOUS
  Administered 2016-11-26: 5 [IU] via SUBCUTANEOUS
  Administered 2016-11-26: 3 [IU] via SUBCUTANEOUS
  Administered 2016-11-27: 2 [IU] via SUBCUTANEOUS
  Administered 2016-11-27: 3 [IU] via SUBCUTANEOUS
  Administered 2016-11-27: 5 [IU] via SUBCUTANEOUS
  Administered 2016-11-28 (×2): 2 [IU] via SUBCUTANEOUS
  Administered 2016-11-28 – 2016-11-29 (×2): 3 [IU] via SUBCUTANEOUS

## 2016-11-17 MED ORDER — HEPARIN (PORCINE) IN NACL 100-0.45 UNIT/ML-% IJ SOLN
1050.0000 [IU]/h | INTRAMUSCULAR | Status: DC
  Administered 2016-11-17: 800 [IU]/h via INTRAVENOUS
  Administered 2016-11-19: 1050 [IU]/h via INTRAVENOUS
  Filled 2016-11-17 (×3): qty 250

## 2016-11-17 MED ORDER — ACETAMINOPHEN 325 MG PO TABS
650.0000 mg | ORAL_TABLET | ORAL | Status: DC | PRN
  Administered 2016-11-21 – 2016-11-29 (×4): 650 mg via ORAL
  Filled 2016-11-17 (×4): qty 2

## 2016-11-17 MED ORDER — ALBUTEROL SULFATE (2.5 MG/3ML) 0.083% IN NEBU
5.0000 mg | INHALATION_SOLUTION | Freq: Once | RESPIRATORY_TRACT | Status: AC
  Administered 2016-11-17: 5 mg via RESPIRATORY_TRACT
  Filled 2016-11-17: qty 6

## 2016-11-17 MED ORDER — NITROGLYCERIN 0.4 MG SL SUBL: 0.4000 mg | SUBLINGUAL_TABLET | SUBLINGUAL | Status: DC | PRN

## 2016-11-17 MED ORDER — SODIUM CHLORIDE 0.9 % IV BOLUS (SEPSIS): 1000.0000 mL | Freq: Once | INTRAVENOUS | Status: DC

## 2016-11-17 MED ORDER — ROSUVASTATIN CALCIUM 10 MG PO TABS
10.0000 mg | ORAL_TABLET | Freq: Every day | ORAL | Status: DC
  Administered 2016-11-17 – 2016-11-29 (×13): 10 mg via ORAL
  Filled 2016-11-17 (×13): qty 1

## 2016-11-17 MED ORDER — HEPARIN BOLUS VIA INFUSION
4000.0000 [IU] | Freq: Once | INTRAVENOUS | Status: AC
Start: 2016-11-17 — End: 2016-11-17
  Administered 2016-11-17: 4000 [IU] via INTRAVENOUS
  Filled 2016-11-17: qty 4000

## 2016-11-17 MED ORDER — LEVOTHYROXINE SODIUM 50 MCG PO TABS
50.0000 ug | ORAL_TABLET | Freq: Every day | ORAL | Status: DC
  Administered 2016-11-17 – 2016-11-29 (×13): 50 ug via ORAL
  Filled 2016-11-17 (×14): qty 1

## 2016-11-17 MED ORDER — ASPIRIN EC 81 MG PO TBEC
81.0000 mg | DELAYED_RELEASE_TABLET | Freq: Every day | ORAL | Status: DC
  Administered 2016-11-18 – 2016-11-29 (×12): 81 mg via ORAL
  Filled 2016-11-17 (×12): qty 1

## 2016-11-17 MED ORDER — INSULIN GLARGINE 100 UNIT/ML ~~LOC~~ SOLN
20.0000 [IU] | Freq: Every day | SUBCUTANEOUS | Status: DC
  Administered 2016-11-17: 20 [IU] via SUBCUTANEOUS
  Filled 2016-11-17 (×2): qty 0.2

## 2016-11-17 MED ORDER — SODIUM CHLORIDE 0.9% FLUSH: 3.0000 mL | INTRAVENOUS | Status: DC | PRN

## 2016-11-17 MED ORDER — CLONIDINE HCL 0.2 MG PO TABS
0.2000 mg | ORAL_TABLET | Freq: Three times a day (TID) | ORAL | Status: DC
  Administered 2016-11-17 – 2016-11-29 (×31): 0.2 mg via ORAL
  Filled 2016-11-17 (×33): qty 1

## 2016-11-17 NOTE — ED Triage Notes (Signed)
Pt from home via [place] with c/o substernal 8/10 non radiating CP starting this morning around 3 am.  Pt reports taking 3 nitro at home with no relief and then called EMS.  Pt was nauseated with EMS and had 3-4 episodes of emesis.  Given 324 mg aspirin, 4 mg Zofran, and 1 additional nitro with full pain relief and nausea relief.  Pt in NAD, A&O.

## 2016-11-17 NOTE — Progress Notes (Signed)
Contacted about critical labs report, trop 0.05. Patient admitted with ACS, on heparin gtt and medical therapy. Awaiting cath due to poor renal function. Asymptomatic, continue to follow troponin trend, no new recs at this time   [employer] DM

## 2016-11-17 NOTE — ED Provider Notes (Addendum)
[place] DEPT Provider Note   CSN: [id number] Arrival date & time: 11/17/16  0845     History   Chief Complaint Chief Complaint  Patient presents with  . Chest Pain    HPI [name] is a 82 y.o. female.  HPI Patient presents after she awoke with chest pain about 6 hours ago She was well prior to going to bed last night. After she woke with a sternal chest pressure, she developed nausea, has had episodes of vomiting. Chest pressure has resolved, spontaneously, without clear intervention. There still mild nausea, anorexia. No fever, no cough, no chills, no confusion, no disorientation, no weakness anywhere. She notes one prior similar occurrence.  Beyond increasing bilateral lower extremity edema, no other recent health changes.    Past Medical History:  Diagnosis Date  . CAD (coronary artery disease) 2003   Last catheterization 2008. Two-vessel PCI of the first OM branch and mid LAD.  Marland Kitchen CKD (chronic kidney disease)   . Congestive heart failure ([place])   . CVA (cerebral vascular accident) ([place]) [place]   . Dementia   . DJD (degenerative joint disease) of lumbar spine   . Hypercholesteremia   . Hypertension   . IDDM (insulin dependent diabetes mellitus) [facility])     Patient Active Problem List   Diagnosis Date Noted  . Aortic valve sclerosis-2D June 2016 10/05/2016  . Chest pain with high risk of acute coronary syndrome 10/05/2016  . 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 Pulmonary hypertension   . Insulin dependent diabetes mellitus with complications ([place])   . Chronic kidney disease, stage IV (severe) ([place])   . Other constipation   . Failure to thrive in adult   . Hypomagnesemia   . CAD S/P PCI '03 and '08 05/11/2015  . Anemia in chronic kidney disease 05/11/2015  . Angina at rest [facility]) 05/09/2015  . Benign paroxysmal positional vertigo 03/21/2014  . HTN (hypertension) 08/09/2013  . Lumbar spondylosis 05/16/2013  . Overweight 05/15/2013    . Varicose veins of lower extremities with other complications [id number]  . Normocytic anemia 10/29/2012  . Hypoglycemia 10/29/2012  . NSVT (nonsustained ventricular tachycardia) ([place]) 10/28/2012  . OA (osteoarthritis) 10/27/2012  . Uncontrolled hypertension 10/26/2011  . DM (diabetes mellitus) ([place]) 03/30/2011  . DIASTOLIC HEART FAILURE, CHRONIC 07/20/2010  . Cerebrovascular disease 03/09/2010  . Hyperlipidemia 12/05/2008    Past Surgical History:  Procedure Laterality Date  . APPENDECTOMY    . KNEE SURGERY      OB History    No data available       Home Medications    Prior to Admission medications   Medication Sig Start Date End Date Taking? Authorizing Provider  ACCU-CHEK AVIVA PLUS test strip CHECK BLOOD SUGER UP TO 3 TIMES A DAY 09/21/16   Timmothy Euler, MD  amLODipine (NORVASC) 10 MG tablet Take 1 tablet (10 mg total) by mouth daily. 10/04/16   Timmothy Euler, MD  aspirin EC 81 MG EC tablet Take 1 tablet (81 mg total) by mouth daily. 10/08/16   Bhavinkumar Bhagat, PA  cloNIDine (CATAPRES) 0.2 MG tablet Take 1 tablet (0.2 mg total) by mouth 3 (three) times daily. 10/27/16   Timmothy Euler, MD  diclofenac sodium (VOLTAREN) 1 % GEL Apply 4 g topically 4 (four) times daily. 07/29/16   Timmothy Euler, MD  DULoxetine (CYMBALTA) 30 MG capsule Take 30 mg by mouth daily.    Historical Provider, MD  Insulin Glargine (LANTUS) 100  UNIT/ML Solostar Pen Inject 20-30 Units into the skin daily at 10 pm. 10/17/16   Timmothy Euler, MD  isosorbide mononitrate (IMDUR) 120 MG 24 hr tablet TAKE 1 TABLET DAILY 05/12/16   Timmothy Euler, MD  levothyroxine (SYNTHROID, LEVOTHROID) 50 MCG tablet Take 1 tablet (50 mcg total) by mouth daily. 08/24/16   Fransisca Kaufmann Dettinger, MD  lisinopril (PRINIVIL,ZESTRIL) 40 MG tablet Take 1 tablet (40 mg total) by mouth daily. Patient taking differently: Take 20 mg by mouth daily.  01/28/16   Timmothy Euler, MD  metoprolol succinate (TOPROL-XL) 25  MG 24 hr tablet Take 0.5 tablets (12.5 mg total) by mouth daily. 10/27/16   Timmothy Euler, MD  nitroGLYCERIN (NITROSTAT) 0.4 MG SL tablet Place 1 tablet (0.4 mg total) under the tongue every 5 (five) minutes x 3 doses as needed for chest pain. 02/24/16   Timmothy Euler, MD  rosuvastatin (CRESTOR) 10 MG tablet Take 1 tablet (10 mg total) by mouth daily. 10/17/16   Timmothy Euler, MD  torsemide (DEMADEX) 20 MG tablet Take 1 tablet (20 mg total) by mouth daily. Patient not taking: Reported on 11/09/2016 11/08/16   Timmothy Euler, MD    Family History Family History  Problem Relation Age of Onset  . Heart attack Mother 7  . Heart attack Father 6  . Diabetes Brother     RETINOPATHY   . Drug abuse Brother   . Liver cancer Brother   . Breast cancer Daughter   . Diabetes Sister     Social History Social History  Substance Use Topics  . Smoking status: Never Smoker  . Smokeless tobacco: Never Used  . Alcohol use No     Allergies   Propoxyphene n-acetaminophen and Simvastatin   Review of Systems Review of Systems  Constitutional:       Per HPI, otherwise negative  HENT:       Per HPI, otherwise negative  Respiratory:       Per HPI, otherwise negative  Cardiovascular:       Per HPI, otherwise negative  Gastrointestinal: Positive for nausea and vomiting.  Endocrine:       Negative aside from HPI  Genitourinary:       Neg aside from HPI   Musculoskeletal:       Per HPI, otherwise negative  Skin: Negative.   Neurological: Negative for syncope.     Physical Exam Updated Vital Signs BP 187/64   Pulse 85   Temp 98 F (36.7 C) (Oral)   Resp 16   SpO2 100%   Physical Exam  Constitutional: She is oriented to person, place, and time. She appears well-developed and well-nourished. No distress.  HENT:  Head: Normocephalic and atraumatic.  Eyes: Conjunctivae and EOM are normal.  Cardiovascular: Normal rate and regular rhythm.   Pulmonary/Chest: Effort  normal and breath sounds normal. No stridor. No respiratory distress.  Abdominal: She exhibits no distension. There is no tenderness.  Musculoskeletal: She exhibits no edema.  Neurological: She is alert and oriented to person, place, and time. No cranial nerve deficit.  Skin: Skin is warm and dry.  Psychiatric: She has a normal mood and affect.  Nursing note and vitals reviewed.    ED Treatments / Results  Labs (all labs ordered are listed, but only abnormal results are displayed) Labs Reviewed  BASIC METABOLIC PANEL - Abnormal; Notable for the following:       Result Value   Potassium 5.7 (*)  CO2 21 (*)    Glucose, Bld 169 (*)    BUN 45 (*)    Creatinine, Ser 3.12 (*)    Calcium 10.4 (*)    GFR calc non Af Amer 13 (*)    GFR calc Af Amer 15 (*)    All other components within normal limits  CBC - Abnormal; Notable for the following:    RBC 3.15 (*)    Hemoglobin 9.5 (*)    HCT 29.0 (*)    All other components within normal limits  HEPATIC FUNCTION PANEL - Abnormal; Notable for the following:    AST 13 (*)    ALT 9 (*)    Bilirubin, Direct <0.1 (*)    All other components within normal limits  BRAIN NATRIURETIC PEPTIDE - Abnormal; Notable for the following:    B Natriuretic Peptide 551.1 (*)    All other components within normal limits  LIPASE, BLOOD  I-STAT TROPOININ, ED    EKG  EKG Interpretation  Date/Time:  Thursday November 17 2016 08:57:22 EST Ventricular Rate:  81 PR Interval:    QRS Duration: 124 QT Interval:  403 QTC Calculation: 468 R Axis:   -14 Text Interpretation:  Sinus rhythm Prolonged PR interval ST-t wave abnormality Non-specific intra-ventricular conduction delay Abnormal ekg Confirmed by Carmin Muskrat  MD [phone]) on 11/17/2016 9:01:50 AM       Radiology Dg Chest 2 View  Result Date: 11/17/2016 CLINICAL DATA:  Chest pain with nausea and vomiting EXAM: CHEST  2 VIEW COMPARISON:  October 05, 2016 and July 03, 2016 FINDINGS: There is a  rather minimal left pleural effusion. There are scattered areas of subsegmental atelectasis in the right mid and lower lung zones. There is generalized mild interstitial thickening, stable. There may be mild chronic interstitial edema. There is no airspace consolidation. Heart is upper normal in size with pulmonary vascularity within normal limits. There is atherosclerotic calcification in the aorta. No evident bone lesions. No adenopathy. IMPRESSION: Question mild chronic congestive heart failure. Scattered areas of mild subsegmental atelectasis on the right. Aortic atherosclerosis present. No adenopathy. Electronically Signed   By: Lowella Grip III M.D.   On: 11/17/2016 09:36    Procedures Procedures (including critical care time)  Medications Ordered in ED Medications  sodium chloride 0.9 % bolus 1,000 mL (0 mLs Intravenous Hold 11/17/16 1056)  albuterol (PROVENTIL) (2.5 MG/3ML) 0.083% nebulizer solution 5 mg (not administered)  furosemide (LASIX) injection 60 mg (60 mg Intravenous Given 11/17/16 1213)     Initial Impression / Assessment and Plan / ED Course  I have reviewed the triage vital signs and the nursing notes.  Pertinent labs & imaging results that were available during my care of the patient were reviewed by me and considered in my medical decision making (see chart for details).  Clinical Course     On repeat exam the patient is awake and alert. I discussed all findings including elevated BNP, hyperkalemia, concern for her heart failure and progression. Patient will receive albuterol, Lasix. No overtly ischemic changes on EKG, nor persistent chest pain, which is somewhat reassuring. I discussed her case with our cardiology colleagues for admission.   Final Clinical Impressions(s) / ED Diagnoses  Hyperkalemia Acute exacerbation of congestive heart failure   Carmin Muskrat, MD 11/17/16 1217  2:20 PM Cardiology recommends medical admission, with Card's following  as a consulting team.     Carmin Muskrat, MD 11/17/16 1421

## 2016-11-17 NOTE — Progress Notes (Signed)
Still awaiting MD call back. Pt pain level decreasing see pain assessment. Will continue to monitor.

## 2016-11-17 NOTE — Progress Notes (Signed)
ANTICOAGULATION CONSULT NOTE - Initial Consult  Pharmacy Consult for Heparin Indication: chest pain/ACS  Allergies  Allergen Reactions  . Propoxyphene N-Acetaminophen     Pt does not know reaction  . Simvastatin Other (See Comments)    headache    Patient Measurements:   Heparin Dosing Weight: 67.6 kg  Vital Signs: Temp: 98 F (36.7 C) (12/21 0851) Temp Source: Oral (12/21 0851) BP: 202/86 (12/21 1351) Pulse Rate: 82 (12/21 1351)  Labs:  Recent Labs  11/17/16 0900  HGB 9.5*  HCT 29.0*  PLT 203  CREATININE 3.12*    Estimated Creatinine Clearance: 13.1 mL/min (by C-G formula based on SCr of 3.12 mg/dL (H)).   Medical History: Past Medical History:  Diagnosis Date  . CAD (coronary artery disease) 2003   Last catheterization 2008. Two-vessel PCI of the first OM branch and mid LAD.  Marland Kitchen CKD (chronic kidney disease)   . Congestive heart failure ([place])   . CVA (cerebral vascular accident) ([place]) [place]   . Dementia   . DJD (degenerative joint disease) of lumbar spine   . Hypercholesteremia   . Hypertension   . IDDM (insulin dependent diabetes mellitus) ([place])     Medications:   (Not in a hospital admission) Scheduled:  . amLODipine  10 mg Oral Daily  . aspirin  324 mg Oral Once  . aspirin  81 mg Oral Daily  . cholecalciferol  1,000 Units Oral Daily  . cloNIDine  0.2 mg Oral TID  . diclofenac sodium  4 g Topical QID  . Insulin Glargine  20 Units Subcutaneous Q2200  . levothyroxine  50 mcg Oral Daily  . metoprolol  5 mg Intravenous Once  . metoprolol succinate  12.5 mg Oral Daily  .  morphine injection  2 mg Intravenous Once  . rosuvastatin  10 mg Oral Daily   Infusions:  . nitroGLYCERIN 20 mcg/min (11/17/16 1508)  . sodium chloride Stopped (11/17/16 1056)    Assessment: 82yo female with history of CKD4, DM2, HTN presents with CP. Pharmacy is consulted to dose heparin for ACS/chest pain.   Goal of Therapy:  Heparin level 0.3-0.7  units/ml Monitor platelets by anticoagulation protocol: Yes   Plan:  Give 4000 units bolus x 1 Start heparin infusion at 800 units/hr Check anti-Xa level in 8 hours and daily while on heparin Continue to monitor H&H and platelets  Andrey Cota. Diona Foley, PharmD, [place] Clinical Pharmacist Pager [phone] 11/17/2016,3:50 PM

## 2016-11-17 NOTE — H&P (Signed)
CARDIOLOGY HISTORY AND PHYSICAL   Patient ID: [name] MRN: [id number] DOB/AGE: [birth year] 82 y.o.  Admit date: 11/17/2016  Primary Physician   Kenn File, MD Primary Cardiologist   Dr Percival Spanish Reason for Consultation   CHF Requesting MD: Dr Vanita Panda  [id number] [name] is a 82 y.o. year old female with a history of DM, HTN, and CKD IV, LAD POBA in '03 and an LAD DES and OM DES in '08. In Nov 2013 she was admitted with a NSTEMI but declined cath>med mgt.   11/08-11/10, Admit w/ chest pain, SBP > 200. Ez neg MI, CP resolved w/ IV NTG>>improved BP control. BB decreased 2nd HR 40s, clonidine patch added. HLD noted, not on statin, Lantus added. 12/01, Pt canceled cards f/u appt 12/12, pt seen, wt 150 lbs, +LE edema, Lasix changed to torsemide, BP improved, somnolence mentioned.  Pt states her weight was 152 lbs yesterday am, did not weigh today, cannot remember any other weights this week. LE edema has been going on > 2 weeks. Increasing DOE.   She was wakened this am by CP, also w/ SOB. She got Lasix 60 mg IV, albuterol neb, SL NTG x 1 and Advil 400 mg. She has urinated a couple of times. BP very high on admission, still high.  Upon first conversation, she was not in that much distress. However, after pt got on the bedpan, her CP became severe, 10/10, associated with nausea. Pt had been diaphoretic, now feels cold. C/o bilateral arm pain also. ECG rechecked and still shows lateral ST depression and inverted T waves, but is unchanged from this am.  Does not meet STEMI criteria.   Past Medical History:  Diagnosis Date  . CAD (coronary artery disease) 2003   Last catheterization 2008. Two-vessel PCI of the first OM branch and mid LAD.  Marland Kitchen CKD (chronic kidney disease)   . Congestive heart failure ([place])   . CVA (cerebral vascular accident) ([place]) [place]   . Dementia   . DJD (degenerative joint disease) of lumbar spine   . Hypercholesteremia   . Hypertension    . IDDM (insulin dependent diabetes mellitus) ([place])      Past Surgical History:  Procedure Laterality Date  . APPENDECTOMY    . KNEE SURGERY      Allergies  Allergen Reactions  . Propoxyphene N-Acetaminophen     Pt does not know reaction  . Simvastatin Other (See Comments)    headache    I have reviewed the patient's current medications  . nitroGLYCERIN 10 mcg/min (11/17/16 1441)  . sodium chloride Stopped (11/17/16 1056)   Medication Sig  ACCU-CHEK AVIVA PLUS test strip CHECK BLOOD SUGER UP TO 3 TIMES A DAY  amLODipine (NORVASC) 10 MG tablet Take 1 tablet (10 mg total) by mouth daily.  aspirin EC 81 MG EC tablet Take 1 tablet (81 mg total) by mouth daily.  cloNIDine (CATAPRES) 0.2 MG tablet Take 1 tablet (0.2 mg total) by mouth 3 (three) times daily.  diclofenac sodium (VOLTAREN) 1 % GEL Apply 4 g topically 4 (four) times daily.  DULoxetine (CYMBALTA) 30 MG capsule Take 30 mg by mouth daily.  Insulin Glargine (LANTUS) 100 UNIT/ML Solostar Pen Inject 20-30 Units into the skin daily at 10 pm.  isosorbide mononitrate (IMDUR) 120 MG 24 hr tablet TAKE 1 TABLET DAILY  levothyroxine (SYNTHROID, LEVOTHROID) 50 MCG tablet Take 1 tablet (50 mcg total) by mouth daily.  lisinopril (PRINIVIL,ZESTRIL) 40 MG  tablet Take 1 tablet (40 mg total) by mouth daily. Patient taking differently: Take 20 mg by mouth daily.   metoprolol succinate (TOPROL-XL) 25 MG 24 hr tablet Take 0.5 tablets (12.5 mg total) by mouth daily.  nitroGLYCERIN (NITROSTAT) 0.4 MG SL tablet Place 1 tablet (0.4 mg total) under the tongue every 5 (five) minutes x 3 doses as needed for chest pain.  rosuvastatin (CRESTOR) 10 MG tablet Take 1 tablet (10 mg total) by mouth daily.  torsemide (DEMADEX) 20 MG tablet Take 1 tablet (20 mg total) by mouth daily. Patient not taking: Reported on 11/09/2016     Social History   Social History  . Marital status: Widowed    Spouse name: N/A  . Number of children: N/A  . Years of  education: N/A   Occupational History  . Retired    Social History Main Topics  . Smoking status: Never Smoker  . Smokeless tobacco: Never Used  . Alcohol use No  . Drug use: No  . Sexual activity: No   Other Topics Concern  . Not on file   Social History Narrative      Lives alone.      Family Status  Relation Status  . Brother Alive  . Brother Alive  . Brother Alive  . Brother Deceased  . Mother Deceased  . Father Deceased  . Sister Alive  . Sister Alive  . Daughter Alive  . Daughter Alive  . Daughter Alive  . Daughter Alive  . Daughter Alive  . Son Alive  . Maternal Grandmother Deceased  . Maternal Grandfather Deceased  . Paternal Grandmother Deceased  . Paternal Grandfather Deceased  . Brother   . Brother   . Brother   . Daughter   . Sister    Family History  Problem Relation Age of Onset  . Heart attack Mother 31  . Heart attack Father 60  . Diabetes Brother     RETINOPATHY   . Drug abuse Brother   . Liver cancer Brother   . Breast cancer Daughter   . Diabetes Sister      ROS:  Full 14 point review of systems complete and found to be negative unless listed above.  Physical Exam: Blood pressure (!) 202/86, pulse 82, temperature 98 F (36.7 C), temperature source Oral, resp. rate 20, SpO2 100 %.  General: Well developed, well nourished, female in no acute distress Head: Eyes PERRLA, No xanthomas.   Normocephalic and atraumatic, oropharynx without edema or exudate. Dentition: poor Lungs: scattered rales, good air exchange Heart: HRRR S1 S2, no rub/gallop, soft murmur. pulses are 2+ all 4 extrem.   Neck: No carotid bruits. No lymphadenopathy.  JVD elevated  Abdomen: Bowel sounds present, abdomen soft and non-tender without masses or hernias noted. Msk:  No spine or cva tenderness. Generalized weakness, no joint deformities or effusions. Extremities: No clubbing or cyanosis. 2-3+ edema.  Neuro: Alert and oriented X 3. No focal deficits  noted. Psych:  Good affect, responds appropriately Skin: No rashes or lesions noted.  Labs:   Lab Results  Component Value Date   WBC 6.7 11/17/2016   HGB 9.5 (L) 11/17/2016   HCT 29.0 (L) 11/17/2016   MCV 92.1 11/17/2016   PLT 203 11/17/2016     Recent Labs Lab 11/17/16 0900  NA 138  K 5.7*  CL 107  CO2 21*  BUN 45*  CREATININE 3.12*  CALCIUM 10.4*  PROT 7.1  BILITOT 0.6  ALKPHOS 46  ALT  9*  AST 13*  GLUCOSE 169*  ALBUMIN 4.1    Recent Labs  11/17/16 0910  TROPIPOC 0.02   B Natriuretic Peptide  Date/Time Value Ref Range Status  11/17/2016 09:00 AM 551.1 (H) 0.0 - 100.0 pg/mL Final  10/05/2016 10:52 AM 411.8 (H) 0.0 - 100.0 pg/mL Final   Lab Results  Component Value Date   CHOL 292 (H) 10/06/2016   HDL 34 (L) 10/06/2016   LDLCALC 211 (H) 10/06/2016   TRIG 237 (H) 10/06/2016   Lipase  Date/Time Value Ref Range Status  11/17/2016 09:00 AM 31 11 - 51 U/L Final   TSH  Date/Time Value Ref Range Status  10/05/2016 04:20 PM 0.859 0.350 - 4.500 uIU/mL Final    Comment:    Performed by a 3rd Generation assay with a functional sensitivity of <=0.01 uIU/mL.   Echo: 10/07/2016 - Left ventricle: The cavity size was normal. There was moderate   concentric hypertrophy. Systolic function was vigorous. The   estimated ejection fraction was in the range of 65% to 70%. Wall   motion was normal; there were no regional wall motion   abnormalities. Features are consistent with a pseudonormal left   ventricular filling pattern, with concomitant abnormal relaxation   and increased filling pressure (grade 2 diastolic dysfunction).   Doppler parameters are consistent with elevated ventricular   end-diastolic filling pressure. - Aortic valve: Trileaflet; moderately thickened, moderately   calcified leaflets predominantly in the non-coronary cusp.   Sclerosis without stenosis. There was no regurgitation. - Aortic root: The aortic root was normal in size. - Ascending  aorta: The ascending aorta was normal in size. - Mitral valve: Calcified annulus. Mildly thickened leaflets .   There was mild regurgitation. Valve area by pressure half-time:   1.39 cm^2. Valve area by continuity equation (using LVOT flow): 1.65 cm^2. - Left atrium: The atrium was moderately dilated. - Right ventricle: The cavity size was normal. Wall thickness was   normal. Systolic function was normal. - Right atrium: The atrium was normal in size. - Tricuspid valve: There was mild regurgitation. - Pulmonic valve: There was mild regurgitation. - Pulmonary arteries: Systolic pressure was mildly increased. PA   peak pressure: 38 mm Hg (S). - Inferior vena cava: The vessel was normal in size. - Pericardium, extracardiac: There was no pericardial effusion.  ECG:  12/21 SR, 1st deg AVB, QRS duration 124 ms, nonspecific conduction delay T wave inversions and ST depression V 5-6, new  Cath: no records in our system  Radiology:  Dg Chest 2 View Result Date: 11/17/2016 CLINICAL DATA:  Chest pain with nausea and vomiting EXAM: CHEST  2 VIEW COMPARISON:  October 05, 2016 and July 03, 2016 FINDINGS: There is a rather minimal left pleural effusion. There are scattered areas of subsegmental atelectasis in the right mid and lower lung zones. There is generalized mild interstitial thickening, stable. There may be mild chronic interstitial edema. There is no airspace consolidation. Heart is upper normal in size with pulmonary vascularity within normal limits. There is atherosclerotic calcification in the aorta. No evident bone lesions. No adenopathy. IMPRESSION: Question mild chronic congestive heart failure. Scattered areas of mild subsegmental atelectasis on the right. Aortic atherosclerosis present. No adenopathy. Electronically Signed   By: Lowella Grip III M.D.   On: 11/17/2016 09:36    ASSESSMENT AND PLAN:   The patient was seen today by Dr Acie Fredrickson, the patient evaluated and the data reviewed.   Principal Problem:   ACS (acute coronary  syndrome) ([place]) - pt ECG is newly abnormal, does not meet STEMI criteria - pt is having ongoing CP>> both arms, nausea and intermittent diaphoresis - cath lab called, pt to be next case. - give ASA 324 mg now if not done before.  Active Problems:   Acute on chronic diastolic CHF (congestive heart failure), NYHA class 4 (HCC) - had Lasix 60 mg IV this am    Uncontrolled hypertension - pt has not had am rx - use IV NTG and IV hydralazine for now - restart home rx once cardiac status stabilized    Insulin dependent diabetes mellitus with complications ([place]) - home rx plus SSI  SignedLenoard Aden 11/17/2016 2:50 PM Beeper [id number]  Co-Sign MD   Attending Note:   The patient was seen and examined.  Agree with assessment and plan as noted above.  Changes made to the above note as needed.  Patient seen and independently examined with Rosaria Ferries, PA .   We discussed all aspects of the encounter. I agree with the assessment and plan as stated above.  1. Coronary artery disease/non-ST segment elevation myocardial infarction: Mrs. Abdou presents with symptoms that are consistent with a non-ST segment elevation myocardial infarction. She is markedly hypertensive and tachycardic. We have done an emergent echo and her left ventricle systolic function remains within normal limits. She did not have any specific segmental wall motion abnormalities on initial preview. Given her creatinine of 3.4 I would like to delay cath at least so that we can have a discussion about dialysis. We've been treating her with heparin and nitroglycerin and generally her pain seems to be improving.  Continue IV nitroglycerin and IV heparin. We will gradually increase her blood pressure medications  2. Chronic diastolic congestive heart failure:  Her left ventricle systolic function is normal. She has chronic kidney disease stage IV which greatly complicates  the issue.  3. Chronic kidney disease-stage IV.  We may need to get a nephrology consult.  3. Essential hypertension: continue current medications. Titrate upward as needed.   I have spent a total of 90 minutes with patient reviewing hospital  notes , telemetry, EKGs, labs and examining patient as well as establishing an assessment and plan that was discussed with the patient. > 50% of time was spent in direct patient care. 7minutes critical care time / bedside echo     Ramond Dial., MD, [facility] 11/17/2016, 3:23 PM [address],  [place] Pager [phone]

## 2016-11-17 NOTE — Progress Notes (Signed)
  Echocardiogram 2D Echocardiogram has been performed.  [name] 11/17/2016, 3:43 PM

## 2016-11-17 NOTE — Progress Notes (Signed)
PT c/o chest pain 6/10 pt sitting in bed doesn't want to lay back for EKG. Ntg gtt increased prn morphine given and ekg obtained. MD, Dr. Candyce Churn paged to make aware of pt situation. Will continue to monitor.

## 2016-11-17 NOTE — Progress Notes (Signed)
CRITICAL VALUE ALERT  Critical value received:  Troponin 0.05  Date of notification:  11/17/2016  Time of notification:  1943  Critical value read back:Yes.    Nurse who received alert:  Hadassah Pais RN  MD notified (1st page):  Dr. Harl Bowie   Time of first page:  [place] paged results to MD

## 2016-11-18 DIAGNOSIS — I10 Essential (primary) hypertension: Secondary | ICD-10-CM

## 2016-11-18 DIAGNOSIS — N184 Chronic kidney disease, stage 4 (severe): Secondary | ICD-10-CM

## 2016-11-18 LAB — CBC
HCT: 26.8 % — ABNORMAL LOW (ref 36.0–46.0)
Hemoglobin: 8.4 g/dL — ABNORMAL LOW (ref 12.0–15.0)
MCH: 30.1 pg (ref 26.0–34.0)
MCHC: 31.3 g/dL (ref 30.0–36.0)
MCV: 96.1 fL (ref 78.0–100.0)
Platelets: 174 10*3/uL (ref 150–400)
RBC: 2.79 MIL/uL — ABNORMAL LOW (ref 3.87–5.11)
RDW: 13.6 % (ref 11.5–15.5)
WBC: 6.4 10*3/uL (ref 4.0–10.5)

## 2016-11-18 LAB — GLUCOSE, CAPILLARY
GLUCOSE-CAPILLARY: 62 mg/dL — AB (ref 65–99)
GLUCOSE-CAPILLARY: 63 mg/dL — AB (ref 65–99)
Glucose-Capillary: 151 mg/dL — ABNORMAL HIGH (ref 65–99)
Glucose-Capillary: 98 mg/dL (ref 65–99)
Glucose-Capillary: 187 mg/dL — ABNORMAL HIGH (ref 65–99)
Glucose-Capillary: 117 mg/dL — ABNORMAL HIGH (ref 65–99)

## 2016-11-18 LAB — COMPREHENSIVE METABOLIC PANEL
ALBUMIN: 3.5 g/dL (ref 3.5–5.0)
ALK PHOS: 39 U/L (ref 38–126)
ALT: 8 U/L — AB (ref 14–54)
AST: 11 U/L — AB (ref 15–41)
Anion gap: 9 (ref 5–15)
BUN: 48 mg/dL — ABNORMAL HIGH (ref 6–20)
CHLORIDE: 108 mmol/L (ref 101–111)
CO2: 23 mmol/L (ref 22–32)
CREATININE: 3.21 mg/dL — AB (ref 0.44–1.00)
Calcium: 9.9 mg/dL (ref 8.9–10.3)
GFR calc Af Amer: 14 mL/min — ABNORMAL LOW (ref >60)
GFR calc non Af Amer: 12 mL/min — ABNORMAL LOW (ref >60)
GLUCOSE: 180 mg/dL — AB (ref 65–99)
Potassium: 5.6 mmol/L — ABNORMAL HIGH (ref 3.5–5.1)
SODIUM: 140 mmol/L (ref 135–145)
Total Bilirubin: 0.5 mg/dL (ref 0.3–1.2)
Total Protein: 6 g/dL — ABNORMAL LOW (ref 6.5–8.1)

## 2016-11-18 LAB — AMYLASE: Amylase: 25 U/L — ABNORMAL LOW (ref 28–100)

## 2016-11-18 LAB — HEPARIN LEVEL (UNFRACTIONATED)
HEPARIN UNFRACTIONATED: 0.31 [IU]/mL (ref 0.30–0.70)
HEPARIN UNFRACTIONATED: 0.44 [IU]/mL (ref 0.30–0.70)
Heparin Unfractionated: 0.19 IU/mL — ABNORMAL LOW (ref 0.30–0.70)

## 2016-11-18 LAB — TROPONIN I
Troponin I: 0.08 ng/mL (ref <0.03)
Troponin I: 0.08 ng/mL (ref <0.03)

## 2016-11-18 LAB — LIPID PANEL
CHOLESTEROL: 134 mg/dL (ref 0–200)
HDL: 51 mg/dL (ref >40)
LDL CALC: 56 mg/dL (ref 0–99)
TRIGLYCERIDES: 134 mg/dL (ref <150)
Total CHOL/HDL Ratio: 2.6 RATIO
VLDL: 27 mg/dL (ref 0–40)

## 2016-11-18 LAB — HEMOGLOBIN A1C
Hgb A1c MFr Bld: 6.1 % — ABNORMAL HIGH (ref 4.8–5.6)
Mean Plasma Glucose: 128 mg/dL

## 2016-11-18 LAB — POTASSIUM: Potassium: 6 mmol/L — ABNORMAL HIGH (ref 3.5–5.1)

## 2016-11-18 LAB — LIPASE, BLOOD: Lipase: 16 U/L (ref 11–51)

## 2016-11-18 MED ORDER — FUROSEMIDE 10 MG/ML IJ SOLN
60.0000 mg | Freq: Two times a day (BID) | INTRAMUSCULAR | Status: DC
  Administered 2016-11-18 – 2016-11-19 (×3): 60 mg via INTRAVENOUS
  Filled 2016-11-18 (×3): qty 6

## 2016-11-18 MED ORDER — DEXTROSE 50 % IV SOLN
INTRAVENOUS | Status: AC
  Administered 2016-11-18 – 2016-11-19 (×2): 25 mL
  Filled 2016-11-18: qty 50

## 2016-11-18 MED ORDER — PANTOPRAZOLE SODIUM 40 MG PO TBEC
40.0000 mg | DELAYED_RELEASE_TABLET | Freq: Two times a day (BID) | ORAL | Status: DC
  Administered 2016-11-18 – 2016-11-29 (×23): 40 mg via ORAL
  Filled 2016-11-18 (×24): qty 1

## 2016-11-18 MED ORDER — CARVEDILOL 6.25 MG PO TABS
6.2500 mg | ORAL_TABLET | Freq: Two times a day (BID) | ORAL | Status: DC
  Administered 2016-11-18 – 2016-11-27 (×18): 6.25 mg via ORAL
  Filled 2016-11-18 (×18): qty 1

## 2016-11-18 MED ORDER — PROMETHAZINE HCL 25 MG/ML IJ SOLN
12.5000 mg | Freq: Four times a day (QID) | INTRAMUSCULAR | Status: DC | PRN
  Administered 2016-11-18: 12.5 mg via INTRAVENOUS
  Filled 2016-11-18: qty 1

## 2016-11-18 MED ORDER — GI COCKTAIL ~~LOC~~
30.0000 mL | Freq: Two times a day (BID) | ORAL | Status: DC | PRN
  Administered 2016-11-18 – 2016-11-26 (×3): 30 mL via ORAL
  Filled 2016-11-18 (×2): qty 30

## 2016-11-18 MED ORDER — ORAL CARE MOUTH RINSE
15.0000 mL | Freq: Two times a day (BID) | OROMUCOSAL | Status: DC
  Administered 2016-11-18 – 2016-11-29 (×17): 15 mL via OROMUCOSAL

## 2016-11-18 NOTE — Progress Notes (Signed)
ANTICOAGULATION CONSULT NOTE - Follow Up Consult  Pharmacy Consult for Heparin  Indication: NSTEMI  Allergies  Allergen Reactions  . Propoxyphene N-Acetaminophen     Pt does not know reaction  . Simvastatin Other (See Comments)    headache    Patient Measurements: Height: 5\' 4"  (162.6 cm) Weight: 149 lb 4.8 oz (67.7 kg) IBW/kg (Calculated) : 54.7  Heparin dosing weight: 67.7 kg  Labs:  Recent Labs  11/17/16 0900 11/17/16 1820 11/18/16 0030 11/18/16 0538 11/18/16 0946 11/18/16 1613 11/18/16 2051  HGB 9.5*  --   --   --   --  8.4*  --   HCT 29.0*  --   --   --   --  26.8*  --   PLT 203  --   --   --   --  174  --   HEPARINUNFRC  --   --  0.19*  --  0.31  --  0.44  CREATININE 3.12*  --   --  3.21*  --   --   --   TROPONINI  --  0.05* 0.08* 0.08*  --   --   --     Estimated Creatinine Clearance: 12.8 mL/min (by C-G formula based on SCr of 3.21 mg/dL (H)).  Assessment: Heparin for chest pain/NSTEMI. Delaying cardiac cath due to renal function. Heparin level is therapeutic at 0.44 on 1050 units/hr. No bleeding noted, Hgb down to 8.4, platelets are normal.  Goal of Therapy:  Heparin level 0.3-0.7 units/ml Monitor platelets by anticoagulation protocol: Yes   Plan:  Continue heparin drip at 1050 units/hr Daily heparin level and CBC Monitor for s/sx of bleeding   Renold Genta, PharmD, BCPS Clinical Pharmacist Phone for tonight - [place] - [phone] 11/18/2016 9:49 PM

## 2016-11-18 NOTE — Progress Notes (Addendum)
Patient's CBG 63; received 240 cc apple juice; repeat CBG 62; received 1/2 amp D50 IV; repeat CBG 117.   Paged Dr. Eula Fried, received orders hold patient's 20 units of lantus tonight since she is on a clear liquid diet w/ poor PO intake.   Also spoke w/ Dr. Eula Fried regarding patient's BP. BP has been soft in the 100s/40s; pt currently receiving IV nitroglycerin at 38mcg/min for chest pain. Patient's PO clonidine is due at this time. Per Dr. Eula Fried, hold PO clonidine.

## 2016-11-18 NOTE — Progress Notes (Addendum)
Patient Name: [name] Date of Encounter: 11/18/2016  Primary Cardiologist:  Dr [facility] Problem List     Principal Problem:   ACS (acute coronary syndrome) [facility]) Active Problems:   Uncontrolled hypertension   CAD S/P PCI '03 and '08   Anemia in chronic kidney disease   Insulin dependent diabetes mellitus with complications (HCC)   Chronic kidney disease, stage IV (severe) (HCC)   Acute on chronic diastolic CHF (congestive heart failure), NYHA class 4 (HCC)     Subjective   No chest pain but nausea this am  Inpatient Medications    Scheduled Meds: . amLODipine  10 mg Oral Daily  . aspirin  324 mg Oral Once  . aspirin EC  81 mg Oral Daily  . cholecalciferol  1,000 Units Oral Daily  . cloNIDine  0.2 mg Oral TID  . diclofenac sodium  4 g Topical QID  . furosemide  60 mg Intravenous BID  . insulin aspart  0-15 Units Subcutaneous TID WC  . insulin glargine  20 Units Subcutaneous Q2200  . levothyroxine  50 mcg Oral QAC breakfast  . mouth rinse  15 mL Mouth Rinse BID  . metoprolol succinate  12.5 mg Oral Daily  . rosuvastatin  10 mg Oral Daily  . sodium chloride  1,000 mL Intravenous Once  . sodium chloride flush  3 mL Intravenous Q12H   Continuous Infusions: . heparin 950 Units/hr (11/18/16 0206)  . nitroGLYCERIN 80 mcg/min (11/18/16 0153)   PRN Meds: sodium chloride, acetaminophen, ALPRAZolam, morphine injection, nitroGLYCERIN, nitroGLYCERIN, ondansetron (ZOFRAN) IV, sodium chloride flush, zolpidem   Vital Signs    Vitals:   11/18/16 0104 11/18/16 0118 11/18/16 0459 11/18/16 0800  BP: (!) 151/51 (!) 128/53 (!) 164/60   Pulse: 71 66 71 79  Resp: 14 14 10 15   Temp:   98 F (36.7 C) 97.8 F (36.6 C)  TempSrc:   Oral Oral  SpO2: 100% 100% 96% 97%  Weight:   149 lb 4.8 oz (67.7 kg)   Height:        Intake/Output Summary (Last 24 hours) at 11/18/16 0815 Last data filed at 11/18/16 0200  Gross per 24 hour  Intake           495.08 ml    Output              750 ml  Net          -254.92 ml   Filed Weights   11/17/16 1805 11/18/16 0459  Weight: 150 lb (68 kg) 149 lb 4.8 oz (67.7 kg)    Physical Exam    GEN: Elderly female, in no acute distress.  HEENT: Grossly normal.  Neck: Supple, no JVD Cardiac: RRR, no murmurs, rubs, or gallops. No clubbing, cyanosis.  Extrem: 2+ bilateral LE edema Respiratory:  Coarse rhonchi,  GI: Soft, nontender, nondistended, BS + x 4, RLQ surgical scar MS: no deformity or atrophy. Skin: warm and dry, no rash. Neuro:  Strength and sensation are intact. Psych: AAOx3.  Normal affect.  Labs    CBC  Recent Labs  11/17/16 0900  WBC 6.7  HGB 9.5*  HCT 29.0*  MCV 92.1  PLT 123456   Basic Metabolic Panel  Recent Labs  11/17/16 0900 11/18/16 0538  NA 138 140  K 5.7* 5.6*  CL 107 108  CO2 21* 23  GLUCOSE 169* 180*  BUN 45* 48*  CREATININE 3.12* 3.21*  CALCIUM 10.4* 9.9   Liver Function  Tests  Recent Labs  11/17/16 0900 11/18/16 0538  AST 13* 11*  ALT 9* 8*  ALKPHOS 46 39  BILITOT 0.6 0.5  PROT 7.1 6.0*  ALBUMIN 4.1 3.5    Recent Labs  11/17/16 0900  LIPASE 31   Cardiac Enzymes  Recent Labs  11/17/16 1820 11/18/16 0030 11/18/16 0538  TROPONINI 0.05* 0.08* 0.08*   BNP Invalid input(s): POCBNP D-Dimer No results for input(s): DDIMER in the last 72 hours. Hemoglobin A1C  Recent Labs  11/17/16 1820  HGBA1C 6.1*   Fasting Lipid Panel  Recent Labs  11/18/16 0030  CHOL 134  HDL 51  LDLCALC 56  TRIG 134  CHOLHDL 2.6   Thyroid Function Tests No results for input(s): TSH, T4TOTAL, T3FREE, THYROIDAB in the last 72 hours.  Invalid input(s): FREET3  Telemetry    AF with RVR earlier, NSR with PVCs now- Personally Reviewed  ECG    NSR, LBBB - Personally Reviewed  Radiology    Dg Chest 2 View  Result Date: 11/17/2016 CLINICAL DATA:  Chest pain with nausea and vomiting EXAM: CHEST  2 VIEW COMPARISON:  October 05, 2016 and July 03, 2016  FINDINGS: There is a rather minimal left pleural effusion. There are scattered areas of subsegmental atelectasis in the right mid and lower lung zones. There is generalized mild interstitial thickening, stable. There may be mild chronic interstitial edema. There is no airspace consolidation. Heart is upper normal in size with pulmonary vascularity within normal limits. There is atherosclerotic calcification in the aorta. No evident bone lesions. No adenopathy. IMPRESSION: Question mild chronic congestive heart failure. Scattered areas of mild subsegmental atelectasis on the right. Aortic atherosclerosis present. No adenopathy. Electronically Signed   By: Lowella Grip III M.D.   On: 11/17/2016 09:36    Cardiac Studies   Echo 11/17/16- Study Conclusions  - Left ventricle: The cavity size was normal. There was moderate   concentric hypertrophy. Systolic function was vigorous. The   estimated ejection fraction was in the range of 65% to 70%. Wall   motion was normal; there were no regional wall motion   abnormalities. Doppler parameters are consistent with high   ventricular filling pressure. - Aortic valve: There was mild stenosis. There was no   regurgitation. Valve area (VTI): 1.91 cm^2. Valve area (Vmax):   1.7 cm^2. Valve area (Vmean): 1.78 cm^2. - Mitral valve: Calcified annulus. The findings are consistent with   mild to moderate stenosis. There was mild regurgitation. Mean   gradient (D): 6 mm Hg. Valve area by continuity equation (using   LVOT flow): 1.9 cm^2. - Right ventricle: The cavity size was normal. Wall thickness was   normal. Systolic function was normal. - Right atrium: The atrium was moderately dilated. - Tricuspid valve: There was trivial regurgitation. - Pulmonary arteries: Systolic pressure was mildly to moderately   increased. PA peak pressure: 47 mm Hg (S).  Patient Profile     82 y.o. year old female with a history of DM, HTN, and CKD IV, LAD POBA in '03 and  an LAD DES and OM DES in '08. Recently admitted 11/08-11/10/17, w/ chest pain, SBP > 200. Ez neg, CP resolved w/ IV NTG>>improved BP control. BB decreased 2nd HR 40s, clonidine patch added. 11/08/16, pt seen with Diast CHF wt 150 lbs, +LE edema, Lasix changed to torsemide. Adm 11/17/16 with CP, SOB.   Assessment & Plan    1. CAD, NSTEMI: Mrs. Cromley presents with symptoms that are consistent with a  NSTEMI. She is markedly hypertensive and tachycardic. An emergent echo was done and her LVF remains within normal limits. She did not have any specific segmental wall motion abnormalities on initial preview. Given her creatinine of 3.4 Dr Acie Fredrickson would like to delay cath at least so that we can have a discussion about dialysis. We've been treating her with heparin and nitroglycerin and generally her pain seems to be improving.  Continue IV nitroglycerin and IV heparin. We will gradually increase her blood pressure medications  Will change her metoprolol to Coreg 6.25 BID   2. Chronic diastolic congestive heart failure:  Her left ventricle systolic function is normal. She has chronic kidney disease stage IV which greatly complicates the issue. No significant diuresis by I/O, wgt down 1 lb.   3. Chronic kidney disease-stage IV.   3. Essential hypertension: continue current medications. Titrate upward as needed.  4. PAF with RVR- will review telemetry with MD  5. Nausea- abd soft, strong smell of urine on exam- ?UTI  6. Hyperkalemia- not on K+ supplement  Plan: clear liquid diet, check urine culture, foley prn, check f/u serum K+ in am  Signed, Kerin Ransom, PA-C  11/18/2016, 8:15 AM   Attending Note:   The patient was seen and examined.  Agree with assessment and plan as noted above.  Changes made to the above note as needed.  Patient seen and independently examined with Kerin Ransom, PA .   We discussed all aspects of the encounter. I agree with the assessment and plan as stated  above.  1. CP:   troponins are relatviley low - more c/w demand ischemia in the setting of marked HTN Continue with medical therapy for now  I have had a long discussion with patient and her daughter. Pt has seen nephrology and does not want dialysis I've explained that she is at high risk for renal failure if we were to do a cath and patient does not want that.   2. HTN:   On multiple meds Will change metoprolol to Coreg 6.25 bid ,  Titrate as neede.d  Will continue with medical therapy    I have spent a total of 40 minutes with patient reviewing hospital  notes , telemetry, EKGs, labs and examining patient as well as establishing an assessment and plan that was discussed with the patient. > 50% of time was spent in direct patient care.    Thayer Headings, Brooke Bonito., MD, [facility] 11/18/2016, 11:29 AM [address]  [place] Pager [phone]

## 2016-11-18 NOTE — Progress Notes (Signed)
ANTICOAGULATION CONSULT NOTE - Follow Up Consult  Pharmacy Consult for Heparin  Indication: chest pain/ACS  Allergies  Allergen Reactions  . Propoxyphene N-Acetaminophen     Pt does not know reaction  . Simvastatin Other (See Comments)    headache    Patient Measurements: Height: 5\' 4"  (162.6 cm) Weight: 149 lb 4.8 oz (67.7 kg) IBW/kg (Calculated) : 54.7  Heparin dosing weight: 67.7 kg  Vital Signs: Temp: 97.8 F (36.6 C) (12/22 0800) Temp Source: Oral (12/22 0800) BP: 164/60 (12/22 0459) Pulse Rate: 79 (12/22 0800)  Labs:  Recent Labs  11/17/16 0900 11/17/16 1820 11/18/16 0030 11/18/16 0538 11/18/16 0946  HGB 9.5*  --   --   --   --   HCT 29.0*  --   --   --   --   PLT 203  --   --   --   --   HEPARINUNFRC  --   --  0.19*  --  0.31  CREATININE 3.12*  --   --  3.21*  --   TROPONINI  --  0.05* 0.08* 0.08*  --     Estimated Creatinine Clearance: 12.8 mL/min (by C-G formula based on SCr of 3.21 mg/dL (H)).  Assessment: Heparin for chest pain/NSTEMI. Delaying cardiac cath due to renal function. Heparin level is just into therapeutic range (0.31) after rate increase from 800 to 950 units/hr ~2am.   Goal of Therapy:  Heparin level 0.3-0.7 units/ml Monitor platelets by anticoagulation protocol: Yes   Plan:   Increase heparin drip to 1050 units/hr to try to keep level in target range.  Heparin level ~8 hrs after increase.  Daily heparin level and CBC.  Arty Baumgartner, [place] Pager: [phone] 11/18/2016,11:00 AM

## 2016-11-18 NOTE — Progress Notes (Signed)
Pt experiencing chest pain. Morphine given. Pt family runs to RN 20 minutes later patient vomiting profusely x2. RN paged PA, EKG obtained.

## 2016-11-18 NOTE — Progress Notes (Signed)
ANTICOAGULATION CONSULT NOTE - Follow Up Consult  Pharmacy Consult for Heparin  Indication: chest pain/ACS  Allergies  Allergen Reactions  . Propoxyphene N-Acetaminophen     Pt does not know reaction  . Simvastatin Other (See Comments)    headache    Patient Measurements: Height: 5\' 4"  (162.6 cm) Weight: 150 lb (68 kg) IBW/kg (Calculated) : 54.7  Vital Signs: Temp: 98 F (36.7 C) (12/22 0014) Temp Source: Oral (12/22 0014) BP: 128/53 (12/22 0118) Pulse Rate: 66 (12/22 0118)  Labs:  Recent Labs  11/17/16 0900 11/17/16 1820 11/18/16 0030  HGB 9.5*  --   --   HCT 29.0*  --   --   PLT 203  --   --   HEPARINUNFRC  --   --  0.19*  CREATININE 3.12*  --   --   TROPONINI  --  0.05* 0.08*    Estimated Creatinine Clearance: 13.2 mL/min (by C-G formula based on SCr of 3.12 mg/dL (H)).  Assessment: Heaprin for CP, mildly elevated trop, initial heparin level is low, no issues per RN.   Goal of Therapy:  Heparin level 0.3-0.7 units/ml Monitor platelets by anticoagulation protocol: Yes   Plan:  -Inc heparin to 950 units/hr -1000 HL Narda Bonds 11/18/2016,2:04 AM

## 2016-11-18 NOTE — Progress Notes (Addendum)
Called to see pt by RN for "chest pain" and nausea. Pt denied any chest pain or abdominal pain to me, only nausea. Will try GI cocktail and phenergan, check amylase, lipase, stat CBC and serum K+. On exam her abdomin is soft and non tender, BS present. EKG shows NSR, LVH, ST changes actually better than on 11/17/16. No obvious offending medications. Add PPI. If the above doesn't help will discuss further work up with MD.   Kerin Ransom PA-C 11/18/2016 1:27 PM

## 2016-11-19 DIAGNOSIS — N182 Chronic kidney disease, stage 2 (mild): Secondary | ICD-10-CM

## 2016-11-19 DIAGNOSIS — I5033 Acute on chronic diastolic (congestive) heart failure: Secondary | ICD-10-CM

## 2016-11-19 DIAGNOSIS — D631 Anemia in chronic kidney disease: Secondary | ICD-10-CM

## 2016-11-19 DIAGNOSIS — I2510 Atherosclerotic heart disease of native coronary artery without angina pectoris: Secondary | ICD-10-CM

## 2016-11-19 DIAGNOSIS — I161 Hypertensive emergency: Secondary | ICD-10-CM

## 2016-11-19 DIAGNOSIS — Z9861 Coronary angioplasty status: Secondary | ICD-10-CM

## 2016-11-19 LAB — BASIC METABOLIC PANEL
Anion gap: 6 (ref 5–15)
BUN: 48 mg/dL — ABNORMAL HIGH (ref 6–20)
CO2: 27 mmol/L (ref 22–32)
Calcium: 9.6 mg/dL (ref 8.9–10.3)
Chloride: 105 mmol/L (ref 101–111)
Creatinine, Ser: 3.42 mg/dL — ABNORMAL HIGH (ref 0.44–1.00)
GFR calc Af Amer: 13 mL/min — ABNORMAL LOW (ref >60)
GFR calc non Af Amer: 12 mL/min — ABNORMAL LOW (ref >60)
Glucose, Bld: 58 mg/dL — ABNORMAL LOW (ref 65–99)
Potassium: 5.7 mmol/L — ABNORMAL HIGH (ref 3.5–5.1)
Sodium: 138 mmol/L (ref 135–145)

## 2016-11-19 LAB — GLUCOSE, CAPILLARY
GLUCOSE-CAPILLARY: 153 mg/dL — AB (ref 65–99)
Glucose-Capillary: 62 mg/dL — ABNORMAL LOW (ref 65–99)
Glucose-Capillary: 141 mg/dL — ABNORMAL HIGH (ref 65–99)
Glucose-Capillary: 145 mg/dL — ABNORMAL HIGH (ref 65–99)
Glucose-Capillary: 89 mg/dL (ref 65–99)

## 2016-11-19 LAB — URINE CULTURE

## 2016-11-19 LAB — HEPARIN LEVEL (UNFRACTIONATED): Heparin Unfractionated: 0.42 IU/mL (ref 0.30–0.70)

## 2016-11-19 MED ORDER — ONDANSETRON HCL 4 MG/2ML IJ SOLN
4.0000 mg | Freq: Four times a day (QID) | INTRAMUSCULAR | Status: DC
  Administered 2016-11-19 – 2016-11-22 (×12): 4 mg via INTRAVENOUS
  Filled 2016-11-19 (×12): qty 2

## 2016-11-19 MED ORDER — HYDRALAZINE HCL 25 MG PO TABS
25.0000 mg | ORAL_TABLET | Freq: Three times a day (TID) | ORAL | Status: DC
  Administered 2016-11-19 – 2016-11-29 (×27): 25 mg via ORAL
  Filled 2016-11-19 (×28): qty 1

## 2016-11-19 MED ORDER — DEXTROSE 5 % IV SOLN
160.0000 mg | Freq: Three times a day (TID) | INTRAVENOUS | Status: DC
  Administered 2016-11-19 – 2016-11-20 (×3): 160 mg via INTRAVENOUS
  Filled 2016-11-19 (×4): qty 16

## 2016-11-19 MED ORDER — ISOSORBIDE MONONITRATE ER 30 MG PO TB24
30.0000 mg | ORAL_TABLET | Freq: Every day | ORAL | Status: DC
  Administered 2016-11-19 – 2016-11-29 (×11): 30 mg via ORAL
  Filled 2016-11-19 (×11): qty 1

## 2016-11-19 MED ORDER — SODIUM POLYSTYRENE SULFONATE 15 GM/60ML PO SUSP
30.0000 g | Freq: Two times a day (BID) | ORAL | Status: AC
  Administered 2016-11-19 (×2): 30 g via ORAL
  Filled 2016-11-19 (×2): qty 120

## 2016-11-19 NOTE — Progress Notes (Signed)
ANTICOAGULATION CONSULT NOTE - Follow Up Consult  Pharmacy Consult for Heparin  Indication: NSTEMI  Allergies  Allergen Reactions  . Propoxyphene N-Acetaminophen     Pt does not know reaction  . Simvastatin Other (See Comments)    headache    Patient Measurements: Height: 5\' 4"  (162.6 cm) Weight: 147 lb 12.8 oz (67 kg) IBW/kg (Calculated) : 54.7  Heparin dosing weight: 67.7 kg  Labs:  Recent Labs  11/17/16 0900 11/17/16 1820  11/18/16 0030 11/18/16 0538 11/18/16 0946 11/18/16 1613 11/18/16 2051 11/19/16 0539  HGB 9.5*  --   --   --   --   --  8.4*  --   --   HCT 29.0*  --   --   --   --   --  26.8*  --   --   PLT 203  --   --   --   --   --  174  --   --   HEPARINUNFRC  --   --   < > 0.19*  --  0.31  --  0.44 0.42  CREATININE 3.12*  --   --   --  3.21*  --   --   --  3.42*  TROPONINI  --  0.05*  --  0.08* 0.08*  --   --   --   --   < > = values in this interval not displayed.  Estimated Creatinine Clearance: 11.9 mL/min (by C-G formula based on SCr of 3.42 mg/dL (H)).  Assessment: Heparin for chest pain/NSTEMI. Delaying cardiac cath due to renal function. Heparin level is therapeutic at 0.42 on 1050 units/hr. Hgb down to 8.4, platelets are normal. No overt s/s bleeding noted.   Goal of Therapy:  Heparin level 0.3-0.7 units/ml Monitor platelets by anticoagulation protocol: Yes   Plan:  Continue heparin drip at 1050 units/hr Daily heparin level and CBC Monitor for s/sx of bleeding  Argie Ramming, PharmD Pharmacy Resident  Pager [phone] 11/19/16 9:53 AM

## 2016-11-19 NOTE — Consult Note (Signed)
[name] is a 82 y.o. year old female with a history of DM, proteinuria and CKD IV (cr 2.66 on 10/17/16), HTN, CAD, s/p NSTEMI, who was admitted with increased weight gain and LE edema.  She has had c/p and SOB.  CXR showed some edema. During course of hospitalization creat has risen to 3.'42mg'$ /dl today and UOP was 575cc yesterday. UA reveals > '300mg'$ /dl of protein. Admission BP on 12/21 was as high as 202/86 dropping to as low as 103/48 on 12/22  Past Medical History:  Diagnosis Date  . CAD (coronary artery disease) 2003   Last catheterization 2008. Two-vessel PCI of the first OM branch and mid LAD.  Marland Kitchen CKD (chronic kidney disease)   . Congestive heart failure ([place])   . CVA (cerebral vascular accident) ([place]) [place]   . Dementia   . DJD (degenerative joint disease) of lumbar spine   . Hypercholesteremia   . Hypertension   . IDDM (insulin dependent diabetes mellitus) ([place])    Past Surgical History:  Procedure Laterality Date  . APPENDECTOMY    . KNEE SURGERY     Social History:  reports that she has never smoked. She has never used smokeless tobacco. She reports that she does not drink alcohol or use drugs. Allergies:  Allergies  Allergen Reactions  . Propoxyphene N-Acetaminophen     Pt does not know reaction  . Simvastatin Other (See Comments)    headache   Family History  Problem Relation Age of Onset  . Heart attack Mother 39  . Heart attack Father 40  . Diabetes Brother     RETINOPATHY   . Drug abuse Brother   . Liver cancer Brother   . Breast cancer Daughter   . Diabetes Sister     Medications:  Prior to Admission:  Prescriptions Prior to Admission  Medication Sig Dispense Refill Last Dose  . amLODipine (NORVASC) 10 MG tablet Take 1 tablet (10 mg total) by mouth daily. 90 tablet 0 11/16/2016 at Unknown time  . aspirin EC 81 MG EC tablet Take 1 tablet (81 mg total) by mouth daily. 30 tablet 11 11/16/2016 at Unknown time  . cholecalciferol (VITAMIN D)  1000 units tablet Take 1,000 Units by mouth daily.   11/16/2016 at Unknown time  . diclofenac sodium (VOLTAREN) 1 % GEL Apply 4 g topically 4 (four) times daily. 100 g 2  at prn  . Insulin Glargine (LANTUS) 100 UNIT/ML Solostar Pen Inject 20-30 Units into the skin daily at 10 pm. 15 mL 5 11/16/2016 at Unknown time  . isosorbide mononitrate (IMDUR) 120 MG 24 hr tablet TAKE 1 TABLET DAILY 30 tablet 5 11/16/2016 at Unknown time  . levothyroxine (SYNTHROID, LEVOTHROID) 50 MCG tablet Take 1 tablet (50 mcg total) by mouth daily. 90 tablet 0 11/16/2016 at Unknown time  . lisinopril (PRINIVIL,ZESTRIL) 40 MG tablet Take 1 tablet (40 mg total) by mouth daily. (Patient taking differently: Take 20 mg by mouth daily. ) 90 tablet 3 11/16/2016 at Unknown time  . metoprolol succinate (TOPROL-XL) 25 MG 24 hr tablet Take 0.5 tablets (12.5 mg total) by mouth daily. 30 tablet 6 11/16/2016 at Unknown time  . nitroGLYCERIN (NITROSTAT) 0.4 MG SL tablet Place 1 tablet (0.4 mg total) under the tongue every 5 (five) minutes x 3 doses as needed for chest pain. 30 tablet 11 11/17/2016 at Unknown time  . rosuvastatin (CRESTOR) 10 MG tablet Take 1 tablet (10 mg total) by mouth daily. 30 tablet  5 11/16/2016 at Unknown time  . torsemide (DEMADEX) 20 MG tablet Take 1 tablet (20 mg total) by mouth daily. 30 tablet 1 11/16/2016 at Unknown time  . cloNIDine (CATAPRES) 0.2 MG tablet Take 1 tablet (0.2 mg total) by mouth 3 (three) times daily. 90 tablet 3 Taking   Scheduled: . amLODipine  10 mg Oral Daily  . aspirin  324 mg Oral Once  . aspirin EC  81 mg Oral Daily  . carvedilol  6.25 mg Oral BID WC  . cholecalciferol  1,000 Units Oral Daily  . cloNIDine  0.2 mg Oral TID  . diclofenac sodium  4 g Topical QID  . furosemide  160 mg Intravenous Q8H  . hydrALAZINE  25 mg Oral Q8H  . insulin aspart  0-15 Units Subcutaneous TID WC  . isosorbide mononitrate  30 mg Oral Daily  . levothyroxine  50 mcg Oral QAC breakfast  . mouth rinse   15 mL Mouth Rinse BID  . ondansetron (ZOFRAN) IV  4 mg Intravenous Q6H  . pantoprazole  40 mg Oral BID  . rosuvastatin  10 mg Oral Daily  . sodium chloride  1,000 mL Intravenous Once  . sodium chloride flush  3 mL Intravenous Q12H  . sodium polystyrene  30 g Oral Q12H     ROS: not obtainable due to sleepiness Blood pressure (!) 180/60, pulse 70, temperature 99.8 F (37.7 C), resp. rate 11, height '5\' 4"'$  (1.626 m), weight 67 kg (147 lb 12.8 oz), SpO2 100 %.  General appearance: slowed mentation and very sleepy, not communictive Head: Normocephalic, without obvious abnormality, atraumatic Eyes: negative  Throat: lips, mucosa, and tongue normal; teeth and gums normal Resp: clear to auscultation bilaterally Chest wall: no tenderness Cardio: regular rate and rhythm, S1, S2 normal, no murmur, click, rub or gallop GI: soft, non-tender; bowel sounds normal; no masses,  no organomegaly Extremities: 1+ r, tr l Skin: Skin color, texture, turgor normal. No rashes or lesions Neurologic: lethargic, poorly arousable Results for orders placed or performed during the hospital encounter of 11/17/16 (from the past 48 hour(s))  Heparin level (unfractionated)     Status: Abnormal   Collection Time: 11/18/16 12:30 AM  Result Value Ref Range   Heparin Unfractionated 0.19 (L) 0.30 - 0.70 IU/mL    Comment:        IF HEPARIN RESULTS ARE BELOW EXPECTED VALUES, AND PATIENT DOSAGE HAS BEEN CONFIRMED, SUGGEST FOLLOW UP TESTING OF ANTITHROMBIN III LEVELS.   Troponin I     Status: Abnormal   Collection Time: 11/18/16 12:30 AM  Result Value Ref Range   Troponin I 0.08 (HH) <0.03 ng/mL    Comment: CRITICAL VALUE NOTED.  VALUE IS CONSISTENT WITH PREVIOUSLY REPORTED AND CALLED VALUE.  Lipid panel     Status: None   Collection Time: 11/18/16 12:30 AM  Result Value Ref Range   Cholesterol 134 0 - 200 mg/dL   Triglycerides 134 <150 mg/dL   HDL 51 >40 mg/dL   Total CHOL/HDL Ratio 2.6 RATIO   VLDL 27 0 - 40  mg/dL   LDL Cholesterol 56 0 - 99 mg/dL    Comment:        Total Cholesterol/HDL:CHD Risk Coronary Heart Disease Risk Table                     Men   Women  1/2 Average Risk   3.4   3.3  Average Risk       5.0  4.4  2 X Average Risk   9.6   7.1  3 X Average Risk  23.4   11.0        Use the calculated Patient Ratio above and the CHD Risk Table to determine the patient's CHD Risk.        ATP III CLASSIFICATION (LDL):  <100     mg/dL   Optimal  100-129  mg/dL   Near or Above                    Optimal  130-159  mg/dL   Borderline  160-189  mg/dL   High  >190     mg/dL   Very High   Comprehensive metabolic panel     Status: Abnormal   Collection Time: 11/18/16  5:38 AM  Result Value Ref Range   Sodium 140 135 - 145 mmol/L   Potassium 5.6 (H) 3.5 - 5.1 mmol/L   Chloride 108 101 - 111 mmol/L   CO2 23 22 - 32 mmol/L   Glucose, Bld 180 (H) 65 - 99 mg/dL   BUN 48 (H) 6 - 20 mg/dL   Creatinine, Ser 3.21 (H) 0.44 - 1.00 mg/dL   Calcium 9.9 8.9 - 10.3 mg/dL   Total Protein 6.0 (L) 6.5 - 8.1 g/dL   Albumin 3.5 3.5 - 5.0 g/dL   AST 11 (L) 15 - 41 U/L   ALT 8 (L) 14 - 54 U/L   Alkaline Phosphatase 39 38 - 126 U/L   Total Bilirubin 0.5 0.3 - 1.2 mg/dL   GFR calc non Af Amer 12 (L) >60 mL/min   GFR calc Af Amer 14 (L) >60 mL/min    Comment: (NOTE) The eGFR has been calculated using the CKD EPI equation. This calculation has not been validated in all clinical situations. eGFR's persistently <60 mL/min signify possible Chronic Kidney Disease.    Anion gap 9 5 - 15  Troponin I     Status: Abnormal   Collection Time: 11/18/16  5:38 AM  Result Value Ref Range   Troponin I 0.08 (HH) <0.03 ng/mL    Comment: CRITICAL VALUE NOTED.  VALUE IS CONSISTENT WITH PREVIOUSLY REPORTED AND CALLED VALUE.  Glucose, capillary     Status: Abnormal   Collection Time: 11/18/16  7:34 AM  Result Value Ref Range   Glucose-Capillary 151 (H) 65 - 99 mg/dL  Heparin level (unfractionated)     Status: None    Collection Time: 11/18/16  9:46 AM  Result Value Ref Range   Heparin Unfractionated 0.31 0.30 - 0.70 IU/mL    Comment:        IF HEPARIN RESULTS ARE BELOW EXPECTED VALUES, AND PATIENT DOSAGE HAS BEEN CONFIRMED, SUGGEST FOLLOW UP TESTING OF ANTITHROMBIN III LEVELS.   Glucose, capillary     Status: None   Collection Time: 11/18/16 11:08 AM  Result Value Ref Range   Glucose-Capillary 98 65 - 99 mg/dL  CBC     Status: Abnormal   Collection Time: 11/18/16  4:13 PM  Result Value Ref Range   WBC 6.4 4.0 - 10.5 K/uL   RBC 2.79 (L) 3.87 - 5.11 MIL/uL   Hemoglobin 8.4 (L) 12.0 - 15.0 g/dL   HCT 26.8 (L) 36.0 - 46.0 %   MCV 96.1 78.0 - 100.0 fL   MCH 30.1 26.0 - 34.0 pg   MCHC 31.3 30.0 - 36.0 g/dL   RDW 13.6 11.5 - 15.5 %   Platelets 174 150 -  400 K/uL  Potassium     Status: Abnormal   Collection Time: 11/18/16  4:13 PM  Result Value Ref Range   Potassium 6.0 (H) 3.5 - 5.1 mmol/L  Amylase     Status: Abnormal   Collection Time: 11/18/16  4:13 PM  Result Value Ref Range   Amylase 25 (L) 28 - 100 U/L  Lipase, blood     Status: None   Collection Time: 11/18/16  4:13 PM  Result Value Ref Range   Lipase 16 11 - 51 U/L  Glucose, capillary     Status: Abnormal   Collection Time: 11/18/16  4:38 PM  Result Value Ref Range   Glucose-Capillary 187 (H) 65 - 99 mg/dL  Urine culture     Status: Abnormal   Collection Time: 11/18/16  5:35 PM  Result Value Ref Range   Specimen Description URINE, CATHETERIZED    Special Requests NONE    Culture MULTIPLE SPECIES PRESENT, SUGGEST RECOLLECTION (A)    Report Status 11/19/2016 FINAL   Heparin level (unfractionated)     Status: None   Collection Time: 11/18/16  8:51 PM  Result Value Ref Range   Heparin Unfractionated 0.44 0.30 - 0.70 IU/mL    Comment:        IF HEPARIN RESULTS ARE BELOW EXPECTED VALUES, AND PATIENT DOSAGE HAS BEEN CONFIRMED, SUGGEST FOLLOW UP TESTING OF ANTITHROMBIN III LEVELS.   Glucose, capillary     Status: Abnormal    Collection Time: 11/18/16  8:51 PM  Result Value Ref Range   Glucose-Capillary 63 (L) 65 - 99 mg/dL  Glucose, capillary     Status: Abnormal   Collection Time: 11/18/16  9:22 PM  Result Value Ref Range   Glucose-Capillary 62 (L) 65 - 99 mg/dL  Glucose, capillary     Status: Abnormal   Collection Time: 11/18/16 10:03 PM  Result Value Ref Range   Glucose-Capillary 117 (H) 65 - 99 mg/dL  Basic metabolic panel     Status: Abnormal   Collection Time: 11/19/16  5:39 AM  Result Value Ref Range   Sodium 138 135 - 145 mmol/L   Potassium 5.7 (H) 3.5 - 5.1 mmol/L   Chloride 105 101 - 111 mmol/L   CO2 27 22 - 32 mmol/L   Glucose, Bld 58 (L) 65 - 99 mg/dL   BUN 48 (H) 6 - 20 mg/dL   Creatinine, Ser 3.42 (H) 0.44 - 1.00 mg/dL   Calcium 9.6 8.9 - 10.3 mg/dL   GFR calc non Af Amer 12 (L) >60 mL/min   GFR calc Af Amer 13 (L) >60 mL/min    Comment: (NOTE) The eGFR has been calculated using the CKD EPI equation. This calculation has not been validated in all clinical situations. eGFR's persistently <60 mL/min signify possible Chronic Kidney Disease.    Anion gap 6 5 - 15  Heparin level (unfractionated)     Status: None   Collection Time: 11/19/16  5:39 AM  Result Value Ref Range   Heparin Unfractionated 0.42 0.30 - 0.70 IU/mL    Comment:        IF HEPARIN RESULTS ARE BELOW EXPECTED VALUES, AND PATIENT DOSAGE HAS BEEN CONFIRMED, SUGGEST FOLLOW UP TESTING OF ANTITHROMBIN III LEVELS.   Glucose, capillary     Status: Abnormal   Collection Time: 11/19/16  6:34 AM  Result Value Ref Range   Glucose-Capillary 62 (L) 65 - 99 mg/dL  Glucose, capillary     Status: Abnormal   Collection Time: 11/19/16  8:16 AM  Result Value Ref Range   Glucose-Capillary 141 (H) 65 - 99 mg/dL  Glucose, capillary     Status: Abnormal   Collection Time: 11/19/16 11:53 AM  Result Value Ref Range   Glucose-Capillary 145 (H) 65 - 99 mg/dL  Glucose, capillary     Status: None   Collection Time: 11/19/16  5:04 PM   Result Value Ref Range   Glucose-Capillary 89 65 - 99 mg/dL  Glucose, capillary     Status: Abnormal   Collection Time: 11/19/16  9:19 PM  Result Value Ref Range   Glucose-Capillary 153 (H) 65 - 99 mg/dL   No results found.  Assessment:  1 AKI, hemodynamically mediate 2 CKD IV prob due to diabetes m. 3 CHF  Plan: 1 Higher doses of diuretics 2 Avoid relative hypotension 3 Renal ultrasound 4 SPEP  Saba Neuman C 11/19/2016, 9:58 PM

## 2016-11-19 NOTE — Progress Notes (Signed)
The client has a CBG of 141 this morning. She received medication twice last night and this morning for low CBG's (please see pervious notes) Notified [place] with cardiology to see if she wanted insulin given. [place] ordered to hold insulin, discontinued lantus at night and progress diet to heart healthy/carb mod as tolerated r/t nausea and vomiting yesterday. I will continue to monitor the client closely.   Saddie Benders RN

## 2016-11-19 NOTE — Evaluation (Addendum)
Physical Therapy Evaluation Patient Details Name: [name] MRN: [id number] DOB: Apr 18, 1932 Today's Date: 11/19/2016   History of Present Illness  82 yo admitted with angina and NSTEMI. PMHx: CAD, DM, HTN, CKD, CHF, CVA, right knee surgery, NSTEMI  Clinical Impression  Pt pleasant and familiar from prior admissions. Pt with decreased activity tolerance today due to weakness and incontinence with all movement. Pt with decreased mobility, balance and strength who will benefit from acute therapy to maximize mobility, function and gait to return pt to PLOF. Recommend OOB to chair daily and encouraged HEP. Anticipate progression once incontinence not a limiting factor.  HR 80 maintained with activity bP 154/76    Follow Up Recommendations Home health PT;Supervision for mobility/OOB    Equipment Recommendations  None recommended by PT    Recommendations for Other Services OT consult     Precautions / Restrictions Precautions Precautions: Fall Precaution Comments: incontinent Restrictions Weight Bearing Restrictions: No      Mobility  Bed Mobility Overal bed mobility: Needs Assistance Bed Mobility: Supine to Sit     Supine to sit: Min assist     General bed mobility comments: cues for sequence with assist to elevate trunk and fully scoot to EOB  Transfers Overall transfer level: Needs assistance   Transfers: Sit to/from Stand;Stand Pivot Transfers Sit to Stand: Min assist Stand pivot transfers: Min assist       General transfer comment: cues for hand placement and safety. Pt stood x 2 with incontinence each time with assist for pericare, rise from surface and anterior translation. Pivot bed to bSC. Pt walked grossly 2' bSC to recliner  Ambulation/Gait Ambulation/Gait assistance: Min assist Ambulation Distance (Feet): 2 Feet Assistive device: Rolling walker (2 wheeled)          Stairs            Wheelchair Mobility    Modified Rankin (Stroke  Patients Only)       Balance Overall balance assessment: Needs assistance   Sitting balance-Leahy Scale: Good       Standing balance-Leahy Scale: Fair                               Pertinent Vitals/Pain Pain Assessment: No/denies pain    Home Living Family/patient expects to be discharged to:: Private residence Living Arrangements: Alone Available Help at Discharge: Family;Available PRN/intermittently Type of Home: House Home Access: Stairs to enter Entrance Stairs-Rails: Left Entrance Stairs-Number of Steps: 4 Home Layout: One level Home Equipment: Walker - 2 wheels;Cane - single point;Bedside commode      Prior Function Level of Independence: Independent         Comments: pt cares for herself, doesn't really cook or drive, daughter checks in a few times a day and brings food     Hand Dominance        Extremity/Trunk Assessment   Upper Extremity Assessment Upper Extremity Assessment: Generalized weakness    Lower Extremity Assessment Lower Extremity Assessment: Generalized weakness    Cervical / Trunk Assessment Cervical / Trunk Assessment: Kyphotic  Communication   Communication: No difficulties  Cognition Arousal/Alertness: Awake/alert Behavior During Therapy: WFL for tasks assessed/performed Overall Cognitive Status: Within Functional Limits for tasks assessed                      General Comments      Exercises     Assessment/Plan    PT Assessment  Patient needs continued PT services  PT Problem List Decreased mobility;Decreased strength;Decreased activity tolerance;Decreased balance;Decreased knowledge of use of DME          PT Treatment Interventions DME instruction;Gait training;Therapeutic exercise;Therapeutic activities;Functional mobility training;Stair training;Patient/family education    PT Goals (Current goals can be found in the Care Plan section)  Acute Rehab PT Goals Patient Stated Goal: return  home PT Goal Formulation: With patient Time For Goal Achievement: 12/03/16 Potential to Achieve Goals: Fair    Frequency Min 3X/week   Barriers to discharge Decreased caregiver support pt lives alone and family can only check in on her    Co-evaluation               End of Session   Activity Tolerance: Patient tolerated treatment well Patient left: in chair;with call bell/phone within reach;with nursing/sitter in room;with chair alarm set Nurse Communication: Mobility status         Time: 1201-1226 PT Time Calculation (min) (ACUTE ONLY): 25 min   Charges:   PT Evaluation $PT Eval Moderate Complexity: 1 Procedure     PT G Codes:        Deyanira Fesler B Janelle Culton 12-04-2016, 1:30 PM  [name], [place]

## 2016-11-19 NOTE — Progress Notes (Signed)
Patient Name: [name] Date of Encounter: 11/19/2016  Primary Cardiologist:  Dr [facility] Problem List     Principal Problem:   ACS (acute coronary syndrome) [facility]) Active Problems:   Uncontrolled hypertension   CAD S/P PCI '03 and '08   Anemia in chronic kidney disease   Insulin dependent diabetes mellitus with complications (HCC)   Chronic kidney disease, stage IV (severe) (HCC)   Acute on chronic diastolic CHF (congestive heart failure), NYHA class 4 (HCC)   Subjective   Intermittent retrosternal chest pain, significant nausea throughout the day.   Inpatient Medications    Scheduled Meds: . amLODipine  10 mg Oral Daily  . aspirin  324 mg Oral Once  . aspirin EC  81 mg Oral Daily  . carvedilol  6.25 mg Oral BID WC  . cholecalciferol  1,000 Units Oral Daily  . cloNIDine  0.2 mg Oral TID  . diclofenac sodium  4 g Topical QID  . furosemide  60 mg Intravenous BID  . insulin aspart  0-15 Units Subcutaneous TID WC  . levothyroxine  50 mcg Oral QAC breakfast  . mouth rinse  15 mL Mouth Rinse BID  . pantoprazole  40 mg Oral BID  . rosuvastatin  10 mg Oral Daily  . sodium chloride  1,000 mL Intravenous Once  . sodium chloride flush  3 mL Intravenous Q12H   Continuous Infusions: . heparin 1,050 Units/hr (11/18/16 1109)  . nitroGLYCERIN 40 mcg/min (11/19/16 0644)   PRN Meds: sodium chloride, acetaminophen, ALPRAZolam, gi cocktail, morphine injection, nitroGLYCERIN, nitroGLYCERIN, ondansetron (ZOFRAN) IV, promethazine, sodium chloride flush, zolpidem   Vital Signs    Vitals:   11/19/16 0624 11/19/16 0817 11/19/16 0854 11/19/16 0954  BP: (!) 157/51 (!) 162/55 (!) 144/59 (!) 145/48  Pulse: 65 76 70 70  Resp: 12 15 18 17   Temp:  99.3 F (37.4 C)    TempSrc:  Oral    SpO2: 100% 100% 100% 100%  Weight:      Height:        Intake/Output Summary (Last 24 hours) at 11/19/16 1024 Last data filed at 11/19/16 1000  Gross per 24 hour  Intake           1825.01 ml  Output              575 ml  Net          1250.01 ml   Filed Weights   11/17/16 1805 11/18/16 0459 11/19/16 0422  Weight: 150 lb (68 kg) 149 lb 4.8 oz (67.7 kg) 147 lb 12.8 oz (67 kg)   Physical Exam    GEN: Elderly female, in no acute distress. Appears tired. HEENT: Grossly normal.  Neck: Supple, no JVD Cardiac: RRR, no murmurs, rubs, or gallops. No clubbing, cyanosis.  Extrem: 2+ bilateral LE edema Respiratory:  Coarse rhonchi,  GI: Soft, nontender, nondistended, BS + x 4, RLQ surgical scar MS: no deformity or atrophy. Skin: warm and dry, no rash. Neuro:  Strength and sensation are intact. Psych: AAOx3.  Normal affect.  Labs    CBC  Recent Labs  11/17/16 0900 11/18/16 1613  WBC 6.7 6.4  HGB 9.5* 8.4*  HCT 29.0* 26.8*  MCV 92.1 96.1  PLT 203 [id number]   Basic Metabolic Panel  Recent Labs  11/18/16 0538 11/18/16 1613 11/19/16 0539  NA 140  --  138  K 5.6* 6.0* 5.7*  CL 108  --  105  CO2 23  --  27  GLUCOSE 180*  --  58*  BUN 48*  --  48*  CREATININE 3.21*  --  3.42*  CALCIUM 9.9  --  9.6   Liver Function Tests  Recent Labs  11/17/16 0900 11/18/16 0538  AST 13* 11*  ALT 9* 8*  ALKPHOS 46 39  BILITOT 0.6 0.5  PROT 7.1 6.0*  ALBUMIN 4.1 3.5    Recent Labs  11/17/16 0900 11/18/16 1613  LIPASE 31 16  AMYLASE  --  25*   Cardiac Enzymes  Recent Labs  11/17/16 1820 11/18/16 0030 11/18/16 0538  TROPONINI 0.05* 0.08* 0.08*    Recent Labs  11/17/16 1820  HGBA1C 6.1*   Fasting Lipid Panel  Recent Labs  11/18/16 0030  CHOL 134  HDL 51  LDLCALC 56  TRIG 134  CHOLHDL 2.6   Telemetry    SR, no recurrent a-fib, SB down to 49 BPM at night  ECG    NSR, LBBB - Personally Reviewed  Radiology    No results found.  Cardiac Studies   Echo 11/17/16- Study Conclusions  - Left ventricle: The cavity size was normal. There was moderate   concentric hypertrophy. Systolic function was vigorous. The   estimated ejection  fraction was in the range of 65% to 70%. Wall   motion was normal; there were no regional wall motion   abnormalities. Doppler parameters are consistent with high   ventricular filling pressure. - Aortic valve: There was mild stenosis. There was no   regurgitation. Valve area (VTI): 1.91 cm^2. Valve area (Vmax):   1.7 cm^2. Valve area (Vmean): 1.78 cm^2. - Mitral valve: Calcified annulus. The findings are consistent with   mild to moderate stenosis. There was mild regurgitation. Mean   gradient (D): 6 mm Hg. Valve area by continuity equation (using   LVOT flow): 1.9 cm^2. - Right ventricle: The cavity size was normal. Wall thickness was   normal. Systolic function was normal. - Right atrium: The atrium was moderately dilated. - Tricuspid valve: There was trivial regurgitation. - Pulmonary arteries: Systolic pressure was mildly to moderately   increased. PA peak pressure: 47 mm Hg (S).    Patient Profile     82 y.o. year old female with a history of DM, HTN, and CKD IV, LAD POBA in '03 and an LAD DES and OM DES in '08. Recently admitted 11/08-11/10/17, w/ chest pain, SBP > 200. Ez neg, CP resolved w/ IV NTG>>improved BP control. BB decreased 2nd HR 40s, clonidine patch added. 11/08/16, pt seen with Diast CHF wt 150 lbs, +LE edema, Lasix changed to torsemide. Adm 11/17/16 with CP, SOB.   Assessment & Plan    1. CAD, demand ischemia: Mrs. Lechtenberg presents with symptoms that are consistent with a demand ischemia - with minimal troponin elevation with flat trend in the settings of hypertensive emergency. LVEF 65-70% with moderate concentric LVH and no regional wall motion abnormalities.   Given her creatinine of 3.4, it was discussed that there is a potential risk of hemodialysis and she is clear about not wanting a cardiac cath.   Continue IV nitroglycerin and IV heparin.   I will start imdur 30 mg po daily and hydralazine 25 mg po TID.   2. Chronic diastolic congestive heart failure:   Her left ventricle systolic function is normal. She has chronic kidney disease stage IV which greatly complicates the issue. No significant diuresis by I/O, wgt down 1 lb. We will consult nephrology as she is persistently hyperkalemic  and her symptoms are possibly aggravated by heperuremia.   3. Chronic kidney disease-stage IV. Call nephrology consult.   3. Hypertensive emergency: as above  4. PAF with RVR- will review telemetry with MD  5. Nausea- abd soft, strong smell of urine on exam- ?UTI  6. Hyperkalemia- we will give kayexylate   Signed, Ena Dawley, MD  11/19/2016, 10:24 AM

## 2016-11-19 NOTE — Progress Notes (Signed)
Wrong column data entry

## 2016-11-19 NOTE — Progress Notes (Addendum)
Patient's CBG 62; gave patient 1/2 amp of D50 IV.

## 2016-11-20 ENCOUNTER — Inpatient Hospital Stay (HOSPITAL_COMMUNITY): Payer: Medicare Other

## 2016-11-20 LAB — URINALYSIS, ROUTINE W REFLEX MICROSCOPIC
BILIRUBIN URINE: NEGATIVE
Glucose, UA: NEGATIVE mg/dL
Ketones, ur: NEGATIVE mg/dL
LEUKOCYTES UA: NEGATIVE
NITRITE: NEGATIVE
PH: 5 (ref 5.0–8.0)
Protein, ur: 100 mg/dL — AB
SPECIFIC GRAVITY, URINE: 1.005 (ref 1.005–1.030)

## 2016-11-20 LAB — CBC
HCT: 25.7 % — ABNORMAL LOW (ref 36.0–46.0)
Hemoglobin: 8.2 g/dL — ABNORMAL LOW (ref 12.0–15.0)
MCH: 29.4 pg (ref 26.0–34.0)
MCHC: 31.9 g/dL (ref 30.0–36.0)
MCV: 92.1 fL (ref 78.0–100.0)
PLATELETS: 162 10*3/uL (ref 150–400)
RBC: 2.79 MIL/uL — AB (ref 3.87–5.11)
RDW: 12.9 % (ref 11.5–15.5)
WBC: 5.6 10*3/uL (ref 4.0–10.5)

## 2016-11-20 LAB — BASIC METABOLIC PANEL
Anion gap: 11 (ref 5–15)
BUN: 49 mg/dL — AB (ref 6–20)
CALCIUM: 9.7 mg/dL (ref 8.9–10.3)
CO2: 26 mmol/L (ref 22–32)
CREATININE: 3.3 mg/dL — AB (ref 0.44–1.00)
Chloride: 99 mmol/L — ABNORMAL LOW (ref 101–111)
GFR calc Af Amer: 14 mL/min — ABNORMAL LOW (ref >60)
GFR, EST NON AFRICAN AMERICAN: 12 mL/min — AB (ref >60)
GLUCOSE: 190 mg/dL — AB (ref 65–99)
Potassium: 4.4 mmol/L (ref 3.5–5.1)
SODIUM: 136 mmol/L (ref 135–145)

## 2016-11-20 LAB — HEPARIN LEVEL (UNFRACTIONATED): Heparin Unfractionated: 0.33 IU/mL (ref 0.30–0.70)

## 2016-11-20 LAB — GLUCOSE, CAPILLARY
GLUCOSE-CAPILLARY: 180 mg/dL — AB (ref 65–99)
GLUCOSE-CAPILLARY: 130 mg/dL — AB (ref 65–99)
GLUCOSE-CAPILLARY: 125 mg/dL — AB (ref 65–99)
Glucose-Capillary: 101 mg/dL — ABNORMAL HIGH (ref 65–99)

## 2016-11-20 MED ORDER — FUROSEMIDE 10 MG/ML IJ SOLN
60.0000 mg | Freq: Three times a day (TID) | INTRAMUSCULAR | Status: DC
  Administered 2016-11-20 – 2016-11-21 (×2): 60 mg via INTRAVENOUS
  Filled 2016-11-20 (×3): qty 6

## 2016-11-20 MED ORDER — FUROSEMIDE 10 MG/ML IJ SOLN: 80.0000 mg | Freq: Three times a day (TID) | INTRAMUSCULAR | Status: DC

## 2016-11-20 NOTE — Progress Notes (Signed)
Bladder scan showed greater than 956ml of urine. In and out cath completed and pt excreted 1300 ml of urine. Pt denies any abdominal pain at this time.

## 2016-11-20 NOTE — Progress Notes (Signed)
Pt c/o of lower abdominal discomfort. While resting in bed. SBP in the 170's. PA on call aware and came to see pt. New orders received. Will cont to monitor pt.

## 2016-11-20 NOTE — Progress Notes (Signed)
Patient noted generalized abdominal pain this morning without nausea, vomiting, fever, chills, or rigors. She was given morphine IV with resolution of her pain. She reports decreased voiding. Renal ultrasound from 12/23 shows mild right-side hydronephrosis. Urine culture from 12/22 with contamination. Currently without any pain. Resting comfortably in bed. Abdomen soft and non-tender. Recommend re-culture of urine, may need to use in-and-out cath to obtain adequate specimen. Bladder scan may be helpful.   Currently afebrile, vitals stable, labs from this morning without leukocytosis or significant drop in hgb.   Await above results.

## 2016-11-20 NOTE — Progress Notes (Signed)
Patient Name: [name] Date of Encounter: 11/20/2016  Primary Cardiologist:  Dr [facility] Problem List     Principal Problem:   ACS (acute coronary syndrome) [facility]) Active Problems:   Uncontrolled hypertension   CAD S/P PCI '03 and '08   Anemia in chronic kidney disease   Insulin dependent diabetes mellitus with complications (HCC)   Chronic kidney disease, stage IV (severe) (HCC)   Acute on chronic diastolic CHF (congestive heart failure), NYHA class 4 (HCC)   Subjective   Sleeping but rouses,   Denies CP or SOB.  Inpatient Medications    Scheduled Meds: . amLODipine  10 mg Oral Daily  . aspirin  324 mg Oral Once  . aspirin EC  81 mg Oral Daily  . carvedilol  6.25 mg Oral BID WC  . cholecalciferol  1,000 Units Oral Daily  . cloNIDine  0.2 mg Oral TID  . diclofenac sodium  4 g Topical QID  . furosemide  160 mg Intravenous Q8H  . hydrALAZINE  25 mg Oral Q8H  . insulin aspart  0-15 Units Subcutaneous TID WC  . isosorbide mononitrate  30 mg Oral Daily  . levothyroxine  50 mcg Oral QAC breakfast  . mouth rinse  15 mL Mouth Rinse BID  . ondansetron (ZOFRAN) IV  4 mg Intravenous Q6H  . pantoprazole  40 mg Oral BID  . rosuvastatin  10 mg Oral Daily  . sodium chloride  1,000 mL Intravenous Once  . sodium chloride flush  3 mL Intravenous Q12H   Continuous Infusions: . heparin 1,050 Units/hr (11/19/16 1513)   PRN Meds: sodium chloride, acetaminophen, ALPRAZolam, gi cocktail, morphine injection, nitroGLYCERIN, nitroGLYCERIN, ondansetron (ZOFRAN) IV, promethazine, sodium chloride flush, zolpidem   Vital Signs    Vitals:   11/20/16 0224 11/20/16 0419 11/20/16 0634 11/20/16 0718  BP: (!) 174/56 (!) 162/57 (!) 180/68 (!) 189/62  Pulse: 90 80  84  Resp: 12 10  11   Temp:  98.7 F (37.1 C)  98.5 F (36.9 C)  TempSrc:    Oral  SpO2: 98% 100%  99%  Weight:  144 lb 12.8 oz (65.7 kg)    Height:        Intake/Output Summary (Last 24 hours) at 11/20/16  0843 Last data filed at 11/20/16 0653  Gross per 24 hour  Intake           904.19 ml  Output             1325 ml  Net          -420.81 ml   Filed Weights   11/18/16 0459 11/19/16 0422 11/20/16 0419  Weight: 149 lb 4.8 oz (67.7 kg) 147 lb 12.8 oz (67 kg) 144 lb 12.8 oz (65.7 kg)   Physical Exam    GEN: Elderly female, in no acute distress. Frail,  Sleeping but rouses HEENT: Grossly normal.  Neck: Supple, no JVD Cardiac: RRR, no murmurs, rubs, or gallops. No clubbing, cyanosis.  Extrem: 2+ bilateral LE edema Respiratory:  Coarse rhonchi,  GI: Soft, nontender, nondistended, BS + x 4, RLQ surgical scar MS: diffuse muscle atrophy. Skin: warm and dry, no rash. Neuro:  Strength and sensation are intact. Psych: AAOx3.  Normal affect.  Labs    CBC  Recent Labs  11/18/16 1613 11/20/16 0328  WBC 6.4 5.6  HGB 8.4* 8.2*  HCT 26.8* 25.7*  MCV 96.1 92.1  PLT 174 0000000   Basic Metabolic Panel  Recent Labs  11/19/16 0539 11/20/16 0328  NA 138 136  K 5.7* 4.4  CL 105 99*  CO2 27 26  GLUCOSE 58* 190*  BUN 48* 49*  CREATININE 3.42* 3.30*  CALCIUM 9.6 9.7   Liver Function Tests  Recent Labs  11/17/16 0900 11/18/16 0538  AST 13* 11*  ALT 9* 8*  ALKPHOS 46 39  BILITOT 0.6 0.5  PROT 7.1 6.0*  ALBUMIN 4.1 3.5    Recent Labs  11/17/16 0900 11/18/16 1613  LIPASE 31 16  AMYLASE  --  25*   Cardiac Enzymes  Recent Labs  11/17/16 1820 11/18/16 0030 11/18/16 0538  TROPONINI 0.05* 0.08* 0.08*    Recent Labs  11/17/16 1820  HGBA1C 6.1*   Fasting Lipid Panel  Recent Labs  11/18/16 0030  CHOL 134  HDL 51  LDLCALC 56  TRIG 134  CHOLHDL 2.6   Telemetry    SR   ECG    NSR, LBBB - Personally Reviewed  Radiology    US Renal  Result Date: 11/20/2016 CLINICAL DATA:  Acute renal insufficiency, diabetes and proteinuria. EXAM: RENAL / URINARY TRACT ULTRASOUND COMPLETE COMPARISON:  CT 05/14/2013 FINDINGS: Right Kidney: Length: 9.4 cm. Echogenicity  within normal limits. Mild dilatation of the intrarenal collecting system. No focal mass or stones. Left Kidney: Length: 10.3 cm. Echogenicity within normal limits. No mass or hydronephrosis visualized. Three parapelvic renal cysts with the largest measuring 2.8 cm over the lower pole. Bladder: Mildly distended.  Patient unable to void. Single incidental gallstone. IMPRESSION: Mild right-sided hydronephrosis. Three parapelvic left renal cysts with the largest measuring 2.8 cm over the lower pole. Single gallstone. Electronically Signed   By: Marin Olp M.D.   On: 11/20/2016 07:31    Cardiac Studies   Echo 11/17/16- Study Conclusions  - Left ventricle: The cavity size was normal. There was moderate   concentric hypertrophy. Systolic function was vigorous. The   estimated ejection fraction was in the range of 65% to 70%. Wall   motion was normal; there were no regional wall motion   abnormalities. Doppler parameters are consistent with high   ventricular filling pressure. - Aortic valve: There was mild stenosis. There was no   regurgitation. Valve area (VTI): 1.91 cm^2. Valve area (Vmax):   1.7 cm^2. Valve area (Vmean): 1.78 cm^2. - Mitral valve: Calcified annulus. The findings are consistent with   mild to moderate stenosis. There was mild regurgitation. Mean   gradient (D): 6 mm Hg. Valve area by continuity equation (using   LVOT flow): 1.9 cm^2. - Right ventricle: The cavity size was normal. Wall thickness was   normal. Systolic function was normal. - Right atrium: The atrium was moderately dilated. - Tricuspid valve: There was trivial regurgitation. - Pulmonary arteries: Systolic pressure was mildly to moderately   increased. PA peak pressure: 47 mm Hg (S).    Patient Profile     82 y.o. year old female with a history of DM, HTN, and CKD IV, LAD POBA in '03 and an LAD DES and OM DES in '08. Recently admitted 11/08-11/10/17, w/ chest pain, SBP > 200. Ez neg, CP resolved w/ IV  NTG>>improved BP control. BB decreased 2nd HR 40s, clonidine patch added. 11/08/16, pt seen with Diast CHF wt 150 lbs, +LE edema, Lasix changed to torsemide. Adm 11/17/16 with CP, SOB.   Assessment & Plan    1. CAD, demand ischemia: Mrs. Gromley presents with symptoms that are consistent with a demand ischemia - with minimal troponin elevation  with flat trend in the settings of hypertensive emergency. LVEF 65-70% with moderate concentric LVH and no regional wall motion abnormalities.   Given her creatinine of 3.4, it was discussed that there is a potential risk of hemodialysis and she is clear about not wanting a cardiac cath.  Given her fragility, a conservative approach is planned.  Stop IV heparin today given anemia.   continue imdur 30 mg po daily and hydralazine 25 mg po TID. Titrate as required  2. Chronic diastolic congestive heart failure:  Her left ventricle systolic function is normal. She has chronic kidney disease stage IV which is likely the primary issue. High dose lasix being given.  3. Chronic kidney disease-stage IV. Appreciate Dr Abel Presto input.   3. Hypertensive emergency: as above  4. PAF with RVR- currently in sinus rhythm.  Given anemia, will stop IV heparin.  Could consider coumadin if hemaglobin remains stable.   Signed, Thompson Grayer, MD  11/20/2016, 8:43 AM

## 2016-11-20 NOTE — Progress Notes (Signed)
In and Out cath done with 675 ml output. Pt tolerated well, denies having pain.

## 2016-11-20 NOTE — Progress Notes (Signed)
Assessment:  1 AKI, hemodynamically mediate 2 CKD IV prob due to diabetes m., mild dil of L collecting system w/ 9.4cm kid 3 CHF  Plan: 1 IV diuretics 2 Avoid relative hypotension 3 f/u SPEP \ Subjective: Interval History: Family visiting; improved UOP  Objective: Vital signs in last 24 hours: Temp:  [98 F (36.7 C)-99.8 F (37.7 C)] 98.3 F (36.8 C) (12/24 1121) Pulse Rate:  [66-93] 76 (12/24 1121) Resp:  [10-18] 10 (12/24 1121) BP: (137-193)/(45-101) 171/56 (12/24 1121) SpO2:  [93 %-100 %] 100 % (12/24 1121) Weight:  [65.7 kg (144 lb 12.8 oz)] 65.7 kg (144 lb 12.8 oz) (12/24 0419) Weight change: -1.361 kg (-3 lb)  Intake/Output from previous day: 12/23 0701 - 12/24 0700 In: 904.2 [P.O.:480; I.V.:292.2; IV Piggyback:132] Out: 1325 [Urine:1325] Intake/Output this shift: Total I/O In: 75 [P.O.:75] Out: -   General appearance: alert and cooperative Resp: rales l base, decrease BS on R Chest wall: no tenderness Cardio: regular rate and rhythm, S1, S2 normal, no murmur, click, rub or gallop Extremities: edema improved tr to 1+ on r, tr on L  Lab Results:  Recent Labs  11/18/16 1613 11/20/16 0328  WBC 6.4 5.6  HGB 8.4* 8.2*  HCT 26.8* 25.7*  PLT 174 162   BMET:  Recent Labs  11/19/16 0539 11/20/16 0328  NA 138 136  K 5.7* 4.4  CL 105 99*  CO2 27 26  GLUCOSE 58* 190*  BUN 48* 49*  CREATININE 3.42* 3.30*  CALCIUM 9.6 9.7   No results for input(s): PTH in the last 72 hours. Iron Studies: No results for input(s): IRON, TIBC, TRANSFERRIN, FERRITIN in the last 72 hours. Studies/Results: US Renal  Result Date: 11/20/2016 CLINICAL DATA:  Acute renal insufficiency, diabetes and proteinuria. EXAM: RENAL / URINARY TRACT ULTRASOUND COMPLETE COMPARISON:  CT 05/14/2013 FINDINGS: Right Kidney: Length: 9.4 cm. Echogenicity within normal limits. Mild dilatation of the intrarenal collecting system. No focal mass or stones. Left Kidney: Length: 10.3 cm. Echogenicity  within normal limits. No mass or hydronephrosis visualized. Three parapelvic renal cysts with the largest measuring 2.8 cm over the lower pole. Bladder: Mildly distended.  Patient unable to void. Single incidental gallstone. IMPRESSION: Mild right-sided hydronephrosis. Three parapelvic left renal cysts with the largest measuring 2.8 cm over the lower pole. Single gallstone. Electronically Signed   By: Marin Olp M.D.   On: 11/20/2016 07:31    Scheduled: . amLODipine  10 mg Oral Daily  . aspirin  324 mg Oral Once  . aspirin EC  81 mg Oral Daily  . carvedilol  6.25 mg Oral BID WC  . cholecalciferol  1,000 Units Oral Daily  . cloNIDine  0.2 mg Oral TID  . diclofenac sodium  4 g Topical QID  . furosemide  160 mg Intravenous Q8H  . hydrALAZINE  25 mg Oral Q8H  . insulin aspart  0-15 Units Subcutaneous TID WC  . isosorbide mononitrate  30 mg Oral Daily  . levothyroxine  50 mcg Oral QAC breakfast  . mouth rinse  15 mL Mouth Rinse BID  . ondansetron (ZOFRAN) IV  4 mg Intravenous Q6H  . pantoprazole  40 mg Oral BID  . rosuvastatin  10 mg Oral Daily  . sodium chloride  1,000 mL Intravenous Once  . sodium chloride flush  3 mL Intravenous Q12H    LOS: 3 days   Jacquelyn Antony C 11/20/2016,12:09 PM

## 2016-11-20 NOTE — Progress Notes (Signed)
Pt resting in bed and denies any pain. Pt has not voided since being in and out cathed this afternoon. Bladder scan obtained showing 574ml. MD on call paged. New orders received. Will update night shift RN.

## 2016-11-21 LAB — BASIC METABOLIC PANEL
Anion gap: 10 (ref 5–15)
BUN: 54 mg/dL — AB (ref 6–20)
CHLORIDE: 97 mmol/L — AB (ref 101–111)
CO2: 30 mmol/L (ref 22–32)
CREATININE: 3.94 mg/dL — AB (ref 0.44–1.00)
Calcium: 9.6 mg/dL (ref 8.9–10.3)
GFR calc Af Amer: 11 mL/min — ABNORMAL LOW (ref >60)
GFR calc non Af Amer: 10 mL/min — ABNORMAL LOW (ref >60)
GLUCOSE: 143 mg/dL — AB (ref 65–99)
POTASSIUM: 4.2 mmol/L (ref 3.5–5.1)
SODIUM: 137 mmol/L (ref 135–145)

## 2016-11-21 LAB — URINE CULTURE
CULTURE: NO GROWTH
SPECIAL REQUESTS: NORMAL

## 2016-11-21 LAB — CBC
HCT: 24.5 % — ABNORMAL LOW (ref 36.0–46.0)
Hemoglobin: 7.9 g/dL — ABNORMAL LOW (ref 12.0–15.0)
MCH: 29.9 pg (ref 26.0–34.0)
MCHC: 32.2 g/dL (ref 30.0–36.0)
MCV: 92.8 fL (ref 78.0–100.0)
PLATELETS: 141 10*3/uL — AB (ref 150–400)
RBC: 2.64 MIL/uL — AB (ref 3.87–5.11)
RDW: 13.2 % (ref 11.5–15.5)
WBC: 4.2 10*3/uL (ref 4.0–10.5)

## 2016-11-21 LAB — GLUCOSE, CAPILLARY
GLUCOSE-CAPILLARY: 183 mg/dL — AB (ref 65–99)
GLUCOSE-CAPILLARY: 153 mg/dL — AB (ref 65–99)
Glucose-Capillary: 204 mg/dL — ABNORMAL HIGH (ref 65–99)
Glucose-Capillary: 221 mg/dL — ABNORMAL HIGH (ref 65–99)

## 2016-11-21 MED ORDER — FUROSEMIDE 40 MG PO TABS
40.0000 mg | ORAL_TABLET | Freq: Two times a day (BID) | ORAL | Status: DC
  Administered 2016-11-21: 40 mg via ORAL
  Filled 2016-11-21: qty 1

## 2016-11-21 MED ORDER — FUROSEMIDE 40 MG PO TABS
40.0000 mg | ORAL_TABLET | Freq: Two times a day (BID) | ORAL | Status: DC
  Administered 2016-11-22: 40 mg via ORAL
  Filled 2016-11-21: qty 1

## 2016-11-21 NOTE — Progress Notes (Signed)
Patient Name: [name] Date of Encounter: 11/21/2016  Primary Cardiologist:   [facility] Problem List     Principal Problem:   ACS (acute coronary syndrome) [facility]) Active Problems:   Uncontrolled hypertension   CAD S/P PCI '03 and '08   Anemia in chronic kidney disease   Insulin dependent diabetes mellitus with complications (HCC)   Chronic kidney disease, stage IV (severe) (HCC)   Acute on chronic diastolic CHF (congestive heart failure), NYHA class 4 (HCC)     Subjective   She denies pain or SOB.  She does not recall who I am which is unusual.    Inpatient Medications    Scheduled Meds: . amLODipine  10 mg Oral Daily  . aspirin  324 mg Oral Once  . aspirin EC  81 mg Oral Daily  . carvedilol  6.25 mg Oral BID WC  . cholecalciferol  1,000 Units Oral Daily  . cloNIDine  0.2 mg Oral TID  . diclofenac sodium  4 g Topical QID  . furosemide  60 mg Intravenous Q8H  . hydrALAZINE  25 mg Oral Q8H  . insulin aspart  0-15 Units Subcutaneous TID WC  . isosorbide mononitrate  30 mg Oral Daily  . levothyroxine  50 mcg Oral QAC breakfast  . mouth rinse  15 mL Mouth Rinse BID  . ondansetron (ZOFRAN) IV  4 mg Intravenous Q6H  . pantoprazole  40 mg Oral BID  . rosuvastatin  10 mg Oral Daily  . sodium chloride  1,000 mL Intravenous Once  . sodium chloride flush  3 mL Intravenous Q12H   Continuous Infusions:  PRN Meds: sodium chloride, acetaminophen, ALPRAZolam, gi cocktail, morphine injection, nitroGLYCERIN, nitroGLYCERIN, ondansetron (ZOFRAN) IV, promethazine, sodium chloride flush, zolpidem   Vital Signs    Vitals:   11/21/16 0225 11/21/16 0500 11/21/16 0627 11/21/16 0723  BP: (!) 134/40 128/66 (!) 143/50 (!) 136/42  Pulse: (!) 59 62  63  Resp: 10 10  13   Temp:  99.3 F (37.4 C)  98.3 F (36.8 C)  TempSrc:    Oral  SpO2: 99% 98%  95%  Weight:  140 lb 14.4 oz (63.9 kg)    Height:        Intake/Output Summary (Last 24 hours) at 11/21/16 0736 Last  data filed at 11/21/16 [id number]  Gross per 24 hour  Intake              365 ml  Output             2700 ml  Net            -2335 ml   Filed Weights   11/19/16 0422 11/20/16 0419 11/21/16 0500  Weight: 147 lb 12.8 oz (67 kg) 144 lb 12.8 oz (65.7 kg) 140 lb 14.4 oz (63.9 kg)    Physical Exam    GEN: NAD.  Neck:  No JVD Cardiac: Regular Rate and Rhythm, no murmurs, rubs, or gallops.  Mild right greater than left edema.  Radials/DP/PT 2+  and equal bilaterally.  Respiratory:  Respirations  regular and unlabored, clear to auscultation bilaterally. GI: Soft, nontender, nondistended, BS + x 4. Skin: warm and dry, no rash. Neuro:  She is generally weak and confused but mildly.  Strength and sensation are intact. Psych:  AAOx3.  Normal affect.  Labs    CBC  Recent Labs  11/20/16 0328 11/21/16 0331  WBC 5.6 4.2  HGB 8.2* 7.9*  HCT 25.7* 24.5*  MCV  92.1 92.8  PLT 162 [id number]*   Basic Metabolic Panel  Recent Labs  11/20/16 0328 11/21/16 0331  NA 136 137  K 4.4 4.2  CL 99* 97*  CO2 26 30  GLUCOSE 190* 143*  BUN 49* 54*  CREATININE 3.30* 3.94*  CALCIUM 9.7 9.6   Liver Function Tests No results for input(s): AST, ALT, ALKPHOS, BILITOT, PROT, ALBUMIN in the last 72 hours.  Recent Labs  11/18/16 1613  LIPASE 16  AMYLASE 25*   Cardiac Enzymes No results for input(s): CKTOTAL, CKMB, CKMBINDEX, TROPONINI in the last 72 hours. BNP Invalid input(s): POCBNP D-Dimer No results for input(s): DDIMER in the last 72 hours. Hemoglobin A1C No results for input(s): HGBA1C in the last 72 hours. Fasting Lipid Panel No results for input(s): CHOL, HDL, LDLCALC, TRIG, CHOLHDL, LDLDIRECT in the last 72 hours. Thyroid Function Tests No results for input(s): TSH, T4TOTAL, T3FREE, THYROIDAB in the last 72 hours.  Invalid input(s): FREET3  Telemetry    NSR - Personally Reviewed  ECG     - Personally Reviewed  Radiology    US Renal  Result Date: 11/20/2016 CLINICAL DATA:  Acute  renal insufficiency, diabetes and proteinuria. EXAM: RENAL / URINARY TRACT ULTRASOUND COMPLETE COMPARISON:  CT 05/14/2013 FINDINGS: Right Kidney: Length: 9.4 cm. Echogenicity within normal limits. Mild dilatation of the intrarenal collecting system. No focal mass or stones. Left Kidney: Length: 10.3 cm. Echogenicity within normal limits. No mass or hydronephrosis visualized. Three parapelvic renal cysts with the largest measuring 2.8 cm over the lower pole. Bladder: Mildly distended.  Patient unable to void. Single incidental gallstone. IMPRESSION: Mild right-sided hydronephrosis. Three parapelvic left renal cysts with the largest measuring 2.8 cm over the lower pole. Single gallstone. Electronically Signed   By: Marin Olp M.D.   On: 11/20/2016 07:31    Cardiac Studies   Renal ultrasound:  Mild right-sided hydronephrosis.  Three parapelvic left renal cysts with the largest measuring 2.8 cm over the lower pole.  Patient Profile     82 y.o.year old femalewith a history of DM, HTN, and CKD IV,LAD POBA in '03 and an LAD DES and OM DES in '08. Recently admitted 11/08-11/10/17,w/ chest pain, SBP >200. Ez neg, CP resolved w/ IV NTG>>improved BP control. BB decreased 2nd HR 40s, clonidine patch added. 11/08/16, pt seen with Diast CHF wt 150 lbs, +LE edema, Lasix changed to torsemide. Adm 11/17/16 with CP, SOB.   Assessment & Plan    CAD:  The patient did not want a cardiac cath.  Medical management on ASA and beta blocker.    HTN:  BP is well controlled on current meds.    ACUTE ON CHRONIC SYSTOLIC HF:   Down 2.3  liters net yesterday.  She does not seem significantly volume overloaded.  Sats are OK.  Change to PO diuretic.    CHRONIC KIDNEY DISEASE STAGE IV:   Nephrology consulted.  Creat is increasing but mildly.  Elevated potassium is resolved  URINARY RETENTION:    The patient has required in an out cath secondary to urinary retenion.  Follow today.   Consider urology consult in AM if  still ongoing.   PAF:   Maintaining NSR.  Not an anticoagulation candidate at this point.   DISPOSITION:  I tried to call her daughter to discuss.  I think that she lives at home by herself.  I would be surprised if she can return to this.  Discussed with nurse.  She will need a family discussion.  Signed, Minus Breeding, MD  11/21/2016, 7:36 AM

## 2016-11-21 NOTE — Progress Notes (Signed)
Assessment:  1 AKI - creat up , IV diuretics dc'd 2 CKD IV - f/b Dr Hinda Lenis, does not want dialysis, confirmed by daughter 3 CHF - mild, resolved 4 DNR - pt request, confirmed by daughter today  Plan:  Changed to oral lasix 40 bid, will hold today  Subjective: Interval History: no SOB, some CP off and on but improved. F/B dr Hinda Lenis in [place] for kidney failure, says she is not going to do dialysis. Discussed EOL and she said she is "ready to go" and refused any heroic measures. I called her daughter who confirmed pt's poor QOL, and endorses her refusal of dialysis and code blue. Daughter wishes to sign papers if needed when she comes in today, have asked RN to assist her in this.   Objective: Vital signs in last 24 hours: Temp:  [98.1 F (36.7 C)-99.3 F (37.4 C)] 98.3 F (36.8 C) (12/25 0723) Pulse Rate:  [59-77] 63 (12/25 0723) Resp:  [6-16] 13 (12/25 0723) BP: (128-177)/(40-66) 136/42 (12/25 0723) SpO2:  [88 %-100 %] 95 % (12/25 0723) Weight:  [63.9 kg (140 lb 14.4 oz)] 63.9 kg (140 lb 14.4 oz) (12/25 0500) Weight change: -1.769 kg (-3 lb 14.4 oz)  Intake/Output from previous day: 12/24 0701 - 12/25 0700 In: 365 [P.O.:365] Out: 2700 [Urine:2700] Intake/Output this shift: No intake/output data recorded.  General appearance: alert and cooperative Resp: rales l base, decrease BS on R Chest wall: no tenderness Cardio: regular rate and rhythm, S1, S2 normal, no murmur, click, rub or gallop Extremities: edema improved tr to 1+ on r, tr on L  Lab Results:  Recent Labs  11/20/16 0328 11/21/16 0331  WBC 5.6 4.2  HGB 8.2* 7.9*  HCT 25.7* 24.5*  PLT 162 141*   BMET:   Recent Labs  11/20/16 0328 11/21/16 0331  NA 136 137  K 4.4 4.2  CL 99* 97*  CO2 26 30  GLUCOSE 190* 143*  BUN 49* 54*  CREATININE 3.30* 3.94*  CALCIUM 9.7 9.6   No results for input(s): PTH in the last 72 hours. Iron Studies: No results for input(s): IRON, TIBC, TRANSFERRIN, FERRITIN  in the last 72 hours. Studies/Results: US Renal  Result Date: 11/20/2016 CLINICAL DATA:  Acute renal insufficiency, diabetes and proteinuria. EXAM: RENAL / URINARY TRACT ULTRASOUND COMPLETE COMPARISON:  CT 05/14/2013 FINDINGS: Right Kidney: Length: 9.4 cm. Echogenicity within normal limits. Mild dilatation of the intrarenal collecting system. No focal mass or stones. Left Kidney: Length: 10.3 cm. Echogenicity within normal limits. No mass or hydronephrosis visualized. Three parapelvic renal cysts with the largest measuring 2.8 cm over the lower pole. Bladder: Mildly distended.  Patient unable to void. Single incidental gallstone. IMPRESSION: Mild right-sided hydronephrosis. Three parapelvic left renal cysts with the largest measuring 2.8 cm over the lower pole. Single gallstone. Electronically Signed   By: Marin Olp M.D.   On: 11/20/2016 07:31    Scheduled: . amLODipine  10 mg Oral Daily  . aspirin  324 mg Oral Once  . aspirin EC  81 mg Oral Daily  . carvedilol  6.25 mg Oral BID WC  . cholecalciferol  1,000 Units Oral Daily  . cloNIDine  0.2 mg Oral TID  . diclofenac sodium  4 g Topical QID  . furosemide  40 mg Oral BID  . hydrALAZINE  25 mg Oral Q8H  . insulin aspart  0-15 Units Subcutaneous TID WC  . isosorbide mononitrate  30 mg Oral Daily  . levothyroxine  50 mcg Oral  QAC breakfast  . mouth rinse  15 mL Mouth Rinse BID  . ondansetron (ZOFRAN) IV  4 mg Intravenous Q6H  . pantoprazole  40 mg Oral BID  . rosuvastatin  10 mg Oral Daily  . sodium chloride  1,000 mL Intravenous Once  . sodium chloride flush  3 mL Intravenous Q12H    LOS: 4 days   [name] D 11/21/2016,10:22 AM

## 2016-11-21 NOTE — Progress Notes (Signed)
No urinary OP noted this shift, bladder scanned greater than 550 ml , order to insert foley.  Edward Qualia RN

## 2016-11-22 DIAGNOSIS — L8990 Pressure ulcer of unspecified site, unspecified stage: Secondary | ICD-10-CM | POA: Insufficient documentation

## 2016-11-22 LAB — CBC
HEMATOCRIT: 25 % — AB (ref 36.0–46.0)
HEMOGLOBIN: 8.2 g/dL — AB (ref 12.0–15.0)
MCH: 29.8 pg (ref 26.0–34.0)
MCHC: 32.8 g/dL (ref 30.0–36.0)
MCV: 90.9 fL (ref 78.0–100.0)
Platelets: 148 10*3/uL — ABNORMAL LOW (ref 150–400)
RBC: 2.75 MIL/uL — AB (ref 3.87–5.11)
RDW: 12.9 % (ref 11.5–15.5)
WBC: 4.3 10*3/uL (ref 4.0–10.5)

## 2016-11-22 LAB — BASIC METABOLIC PANEL
Anion gap: 14 (ref 5–15)
BUN: 70 mg/dL — AB (ref 6–20)
CHLORIDE: 95 mmol/L — AB (ref 101–111)
CO2: 27 mmol/L (ref 22–32)
Calcium: 9.6 mg/dL (ref 8.9–10.3)
Creatinine, Ser: 4.45 mg/dL — ABNORMAL HIGH (ref 0.44–1.00)
GFR calc Af Amer: 10 mL/min — ABNORMAL LOW (ref >60)
GFR calc non Af Amer: 8 mL/min — ABNORMAL LOW (ref >60)
Glucose, Bld: 219 mg/dL — ABNORMAL HIGH (ref 65–99)
POTASSIUM: 4.3 mmol/L (ref 3.5–5.1)
SODIUM: 136 mmol/L (ref 135–145)

## 2016-11-22 LAB — PROTEIN ELECTROPHORESIS, SERUM
A/G RATIO SPE: 1.3 (ref 0.7–1.7)
ALPHA-1-GLOBULIN: 0.3 g/dL (ref 0.0–0.4)
Albumin ELP: 3.1 g/dL (ref 2.9–4.4)
Alpha-2-Globulin: 0.8 g/dL (ref 0.4–1.0)
Beta Globulin: 0.7 g/dL (ref 0.7–1.3)
GLOBULIN, TOTAL: 2.3 g/dL (ref 2.2–3.9)
Gamma Globulin: 0.6 g/dL (ref 0.4–1.8)
Total Protein ELP: 5.4 g/dL — ABNORMAL LOW (ref 6.0–8.5)

## 2016-11-22 LAB — GLUCOSE, CAPILLARY
GLUCOSE-CAPILLARY: 200 mg/dL — AB (ref 65–99)
GLUCOSE-CAPILLARY: 194 mg/dL — AB (ref 65–99)
Glucose-Capillary: 207 mg/dL — ABNORMAL HIGH (ref 65–99)
Glucose-Capillary: 216 mg/dL — ABNORMAL HIGH (ref 65–99)

## 2016-11-22 LAB — ANTINUCLEAR ANTIBODIES, IFA: ANTINUCLEAR ANTIBODIES, IFA: NEGATIVE

## 2016-11-22 MED ORDER — SODIUM CHLORIDE 0.9 % IV SOLN: 250.0000 mL | INTRAVENOUS | Status: DC | PRN

## 2016-11-22 NOTE — Progress Notes (Addendum)
Assessment:  1 Acute on CRF - creat continues to rise post diuresis 2 CKD IV - f/b Dr Hinda Lenis, does not want dialysis, confirmed by daughter 3 Pulm edema -  Resolved, down 7kg by wts 4 DNR - pt request 5 HTN - stable , on 4 bp meds  Plan:  DC'd diuretics, started IV NS 50 cc/hr   Kelly Splinter MD [facility] pgr [phone]   11/22/2016, 10:45 AM    Subjective: No c/o's, no sob or cough, creat up 4.4 today, UOP 800 cc  Objective: Vital signs in last 24 hours: Temp:  [98.1 F (36.7 C)-98.4 F (36.9 C)] 98.4 F (36.9 C) (12/26 0750) Pulse Rate:  [57-65] 65 (12/26 0750) Resp:  [10-15] 15 (12/26 0750) BP: (128-154)/(44-55) 154/51 (12/26 0825) SpO2:  [94 %-98 %] 96 % (12/26 0750) Weight:  [61.5 kg (135 lb 9.6 oz)] 61.5 kg (135 lb 9.6 oz) (12/26 0400) Weight change: -2.404 kg (-5 lb 4.8 oz)  Intake/Output from previous day: 12/25 0701 - 12/26 0700 In: 680 [P.O.:680] Out: 850 [Urine:850] Intake/Output this shift: Total I/O In: 240 [P.O.:240] Out: -   General appearance: alert and cooperative Resp: clear bilat Cardio: RRR no mrg Abd: soft ntnd Extremities: no LE edema  Lab Results:  Recent Labs  11/21/16 0331 11/22/16 0401  WBC 4.2 4.3  HGB 7.9* 8.2*  HCT 24.5* 25.0*  PLT 141* 148*   BMET:   Recent Labs  11/21/16 0331 11/22/16 0822  NA 137 136  K 4.2 4.3  CL 97* 95*  CO2 30 27  GLUCOSE 143* 219*  BUN 54* 70*  CREATININE 3.94* 4.45*  CALCIUM 9.6 9.6   No results for input(s): PTH in the last 72 hours. Iron Studies: No results for input(s): IRON, TIBC, TRANSFERRIN, FERRITIN in the last 72 hours. Studies/Results: No results found.  Scheduled: . amLODipine  10 mg Oral Daily  . aspirin  324 mg Oral Once  . aspirin EC  81 mg Oral Daily  . carvedilol  6.25 mg Oral BID WC  . cholecalciferol  1,000 Units Oral Daily  . cloNIDine  0.2 mg Oral TID  . diclofenac sodium  4 g Topical QID  . hydrALAZINE  25 mg Oral Q8H  . insulin  aspart  0-15 Units Subcutaneous TID WC  . isosorbide mononitrate  30 mg Oral Daily  . levothyroxine  50 mcg Oral QAC breakfast  . mouth rinse  15 mL Mouth Rinse BID  . ondansetron (ZOFRAN) IV  4 mg Intravenous Q6H  . pantoprazole  40 mg Oral BID  . rosuvastatin  10 mg Oral Daily  . sodium chloride  1,000 mL Intravenous Once  . sodium chloride flush  3 mL Intravenous Q12H

## 2016-11-22 NOTE — Plan of Care (Signed)
Problem: Pain Managment: Goal: General experience of comfort will improve Outcome: Progressing Medicated with Tylenol for pain in the right heel with moderate relief  Problem: Activity: Goal: Risk for activity intolerance will decrease Outcome: Not Progressing Verbalizes feeling weak  Problem: Nutrition: Goal: Adequate nutrition will be maintained Outcome: Not Progressing Poor appetite, needs encouragement and assistance  Problem: Bowel/Gastric: Goal: Will not experience complications related to bowel motility Outcome: Progressing No gastric or bowel issues reported, BM on 11/21/2016

## 2016-11-22 NOTE — Progress Notes (Signed)
Patient Name: [name] Date of Encounter: 11/22/2016  Primary Cardiologist:   [facility] Problem List     Principal Problem:   ACS (acute coronary syndrome) [facility]) Active Problems:   Uncontrolled hypertension   CAD S/P PCI '03 and '08   Anemia in chronic kidney disease   Insulin dependent diabetes mellitus with complications (HCC)   Chronic kidney disease, stage IV (severe) (HCC)   Acute on chronic diastolic CHF (congestive heart failure), NYHA class 4 (HCC)   Pressure injury of skin     Subjective   She denies pain or SOB.     Inpatient Medications    Scheduled Meds: . amLODipine  10 mg Oral Daily  . aspirin  324 mg Oral Once  . aspirin EC  81 mg Oral Daily  . carvedilol  6.25 mg Oral BID WC  . cholecalciferol  1,000 Units Oral Daily  . cloNIDine  0.2 mg Oral TID  . diclofenac sodium  4 g Topical QID  . furosemide  40 mg Oral BID  . hydrALAZINE  25 mg Oral Q8H  . insulin aspart  0-15 Units Subcutaneous TID WC  . isosorbide mononitrate  30 mg Oral Daily  . levothyroxine  50 mcg Oral QAC breakfast  . mouth rinse  15 mL Mouth Rinse BID  . ondansetron (ZOFRAN) IV  4 mg Intravenous Q6H  . pantoprazole  40 mg Oral BID  . rosuvastatin  10 mg Oral Daily  . sodium chloride  1,000 mL Intravenous Once  . sodium chloride flush  3 mL Intravenous Q12H   Continuous Infusions:  PRN Meds: sodium chloride, acetaminophen, ALPRAZolam, gi cocktail, morphine injection, nitroGLYCERIN, nitroGLYCERIN, ondansetron (ZOFRAN) IV, promethazine, sodium chloride flush, zolpidem   Vital Signs    Vitals:   11/22/16 0010 11/22/16 0400 11/22/16 0750 11/22/16 0825  BP: (!) 141/55 (!) 134/47  (!) 154/51  Pulse: (!) 58 61 65   Resp: 12 10 15    Temp: 98.1 F (36.7 C) 98.1 F (36.7 C) 98.4 F (36.9 C)   TempSrc:   Oral   SpO2: 98% 98% 96%   Weight:  135 lb 9.6 oz (61.5 kg)    Height:        Intake/Output Summary (Last 24 hours) at 11/22/16 0957 Last data filed at  11/22/16 0744  Gross per 24 hour  Intake              820 ml  Output              850 ml  Net              -30 ml   Filed Weights   11/20/16 0419 11/21/16 0500 11/22/16 0400  Weight: 144 lb 12.8 oz (65.7 kg) 140 lb 14.4 oz (63.9 kg) 135 lb 9.6 oz (61.5 kg)    Physical Exam    GEN: NAD.  Neck:  No JVD Cardiac: Regular Rate and Rhythm, no murmurs, rubs, or gallops.  Mild right greater than left edema.  Radials/DP/PT 2+  and equal bilaterally.  Respiratory:  Respirations  regular and unlabored, clear to auscultation bilaterally. GI: Soft, nontender, nondistended, BS + x 4. Skin: warm and dry, no rash. Neuro:  She is generally weak and confused but mildly.  Strength and sensation are intact. Psych:  AAOx3.  Normal affect.  Labs    CBC  Recent Labs  11/21/16 0331 11/22/16 0401  WBC 4.2 4.3  HGB 7.9* 8.2*  HCT 24.5* 25.0*  MCV 92.8 90.9  PLT 141* 123456*   Basic Metabolic Panel  Recent Labs  11/21/16 0331 11/22/16 0822  NA 137 136  K 4.2 4.3  CL 97* 95*  CO2 30 27  GLUCOSE 143* 219*  BUN 54* 70*  CREATININE 3.94* 4.45*  CALCIUM 9.6 9.6   Liver Function Tests No results for input(s): AST, ALT, ALKPHOS, BILITOT, PROT, ALBUMIN in the last 72 hours. No results for input(s): LIPASE, AMYLASE in the last 72 hours. Cardiac Enzymes No results for input(s): CKTOTAL, CKMB, CKMBINDEX, TROPONINI in the last 72 hours. BNP Invalid input(s): POCBNP D-Dimer No results for input(s): DDIMER in the last 72 hours. Hemoglobin A1C No results for input(s): HGBA1C in the last 72 hours. Fasting Lipid Panel No results for input(s): CHOL, HDL, LDLCALC, TRIG, CHOLHDL, LDLDIRECT in the last 72 hours. Thyroid Function Tests No results for input(s): TSH, T4TOTAL, T3FREE, THYROIDAB in the last 72 hours.  Invalid input(s): FREET3  Telemetry    NSR - Personally Reviewed  ECG     - Personally Reviewed  Radiology    No results found.  Cardiac Studies   Renal ultrasound:  Mild  right-sided hydronephrosis.  Three parapelvic left renal cysts with the largest measuring 2.8 cm over the lower pole.  Patient Profile     82 y.o.year old femalewith a history of DM, HTN, and CKD IV,LAD POBA in '03 and an LAD DES and OM DES in '08. Recently admitted 11/08-11/10/17,w/ chest pain, SBP >200. Ez neg, CP resolved w/ IV NTG>>improved BP control. BB decreased 2nd HR 40s, clonidine patch added. 11/08/16, pt seen with Diast CHF wt 150 lbs, +LE edema, Lasix changed to torsemide. Adm 11/17/16 with CP, SOB.   Assessment & Plan    CAD:  The patient did not want a cardiac cath.  Medical management on ASA and beta blocker.    HTN:  BP is well controlled on current meds.    ACUTE ON CHRONIC SYSTOLIC HF:   Down 2.3  liters net yesterday.  She does not seem significantly volume overloaded.  Sats are OK.  Change to PO diuretic.    CHRONIC KIDNEY DISEASE STAGE IV:   Nephrology consulted.  Creat is increasing but mildly.  Elevated potassium is resolved  URINARY RETENTION:    The patient has required in an out cath secondary to urinary retenion.  Follow today.   Consider urology consult in AM if still ongoing.   PAF:   Maintaining NSR.  Not an anticoagulation candidate at this point.   Placement:   Will get PT/OT consult for assistance in determining where she should go at discharge.     Mertie Moores, MD  11/22/2016 10:03 AM    [address],  [place] [address], [place]  [id number] Pager [phone] Phone: [phone]; Fax: [phone]

## 2016-11-22 NOTE — Consult Note (Signed)
   [facility] CM Inpatient Consult   11/22/2016  [name] Sep 11, 1932 [id number]  Received notification of the patient's admission, will follow up with patient disposition and ongoing [place] Management needs.    1500:  Spoke with inpatient RNCM who states the patient could benefit from [facility] services. Patient has been previously active with [facility] nurse.  Will continue to follow up for post hospital monitoring and needs.Came by to speak with the patient but she was sound asleep and did not disturb.  No family in the room at this time. . A brochure was left at the bedside.  Inpatient RNCM states patient will likely DC to the daughter's who lives nearby. Patient has been followed for HF monitoring and has a tele-monitoirng with her [place].  [facility] will follow up.  For questions, please contact:  Natividad Brood, RN BSN [facility] Liaison  [phone] business mobile phone Toll free office [phone]

## 2016-11-22 NOTE — Consult Note (Signed)
[place] Nurse wound consult note Reason for Consult:Consult requested for right buttock Wound type: Stage 2 pressure injury to right buttock Pressure Ulcer POA: No Measurement: .5X.5X.1cm Wound bed: pink dry wound bed Drainage (amount, consistency, odor) No odor or drainage Periwound: Intact skin surrounding Dressing procedure/placement/frequency: Foam dressing to protect and promote healing.  Discussed plan of care with patient and they verbalized understanding. Please re-consult if further assistance is needed.  Thank-you,  Julien Girt MSN, [place], [place], [place], [place]

## 2016-11-22 NOTE — Care Management Note (Addendum)
Case Management Note Marvetta Gibbons RN, BSN Unit 2W-Case Manager [phone] Covering 3W  Patient Details  Name: [name] MRN: [id number] Date of Birth: 06/26/1932  Subjective/Objective: Pt admitted with ACS,                    Action/Plan: PTA pt lived at home alone, has daughter nearby that assist. Per conversation with pt and daughter at bedside- plan is for pt to stay with daughter for a short while. Pt and daughter state that pt does not have any difficulty getting meds or affording copays- uses [employer]- daughter is trying to find out if insurance will cover her being pt's aide- discussed this option with daughter- daughter to f/u with insurance. Pt does not have Medicaid. Per daughter they would like HH and have used [facility] in the past - would benefit from HHRN/PT/aide- MD please place orders if agree. Pt also needs 3n1 for home- will need DME order for 3n1.  [facility] consult placed  Expected Discharge Date:                  Expected Discharge Plan:  [place]  In-House Referral:     Discharge planning Services  CM Consult  Post Acute Care Choice:  Home Health Choice offered to:  Patient, Adult Children  DME Arranged:    DME Agency:  [place]:    [place] Agency:  [place]  Status of Service:  In process, will continue to follow  If discussed at Long Length of Stay Meetings, dates discussed:    Additional Comments:  Dawayne Patricia, RN 11/22/2016, 11:26 AM

## 2016-11-22 NOTE — Progress Notes (Signed)
   11/22/16 1138  Clinical Encounter Type  Visited With Patient and family together  Visit Type Initial  Referral From Chaplain  Consult/Referral To Chaplain  Recommendations (followup)  Spiritual Encounters  Spiritual Needs Literature  Stress Factors  Patient Stress Factors Health changes  Family Stress Factors Health changes  Advance Directives (For Healthcare)  Does Patient Have a Medical Advance Directive? No  Would patient like information on creating a medical advance directive? Yes (Inpatient - patient defers creating a medical advance directive at this time)  Pt. Wants to discuss with family member first, will call if need further assistance from pastoral services.  No other needs accessed at this time.   [employer]  [phone]

## 2016-11-22 NOTE — Care Management Important Message (Signed)
Important Message  Patient Details  Name: [name] MRN: [id number] Date of Birth: 03-25-1932   Medicare Important Message Given:  Yes    Dana Dorner 11/22/2016, 2:08 PM

## 2016-11-22 NOTE — Progress Notes (Signed)
Physical Therapy Treatment Patient Details Name: [name] MRN: [id number] DOB: 21-Jul-1932 Today's Date: 11/22/2016    History of Present Illness 82 yo admitted with angina and NSTEMI. PMHx: CAD, DM, HTN, CKD, CHF, CVA, right knee surgery, NSTEMI    PT Comments    Pt in bed on arrival reporting she has not been up walking since Thursday.  Pt presents with weakness and balance deficits.  Pt would benefit from Short term SNF placement to improve strength and promote functional independence.  If patient refuses placement will require 24 hour assist with all mobility.  Will inform supervising PT of change in d/c recommendations.    Follow Up Recommendations  SNF     Equipment Recommendations   (TBA at next venue)    Recommendations for Other Services       Precautions / Restrictions Precautions Precautions: Fall Restrictions Weight Bearing Restrictions: No    Mobility  Bed Mobility Overal bed mobility: Needs Assistance Bed Mobility: Supine to Sit     Supine to sit: Min assist     General bed mobility comments: Increased time and effort to advance to edge of bed.  Pt required assist to scoot forward with chux pad.    Transfers Overall transfer level: Needs assistance Equipment used: Rolling walker (2 wheeled) Transfers: Sit to/from Stand Sit to Stand: Min assist;Mod assist (assist level varries with posterior lean.  )         General transfer comment: cues for hand placement and safety. Pt stood x 3 trials of standing.  heavy reliance on RW with poor balance.  Pt required cues for hand placement and min assist to maintain balance in standing.    Ambulation/Gait Ambulation/Gait assistance: Min assist Ambulation Distance (Feet):  (steps to chair then 60 ft gait trial.  ) Assistive device: Rolling walker (2 wheeled) Gait Pattern/deviations: Step-through pattern;Decreased stride length;Shuffle;Trunk flexed Gait velocity: slow with close chair follow for safety.   Gait velocity interpretation: Below normal speed for age/gender General Gait Details: Cues for upper trunk control, increasing stride length and BOS.  Pt with noticeable weakness and required min assist to steer RW and keep a straight path.     Stairs            Wheelchair Mobility    Modified Rankin (Stroke Patients Only)       Balance                                    Cognition Arousal/Alertness: Awake/alert Behavior During Therapy: WFL for tasks assessed/performed Overall Cognitive Status: Within Functional Limits for tasks assessed                      Exercises      General Comments        Pertinent Vitals/Pain Pain Assessment: No/denies pain    Home Living                      Prior Function            PT Goals (current goals can now be found in the care plan section) Acute Rehab PT Goals Patient Stated Goal: return home Potential to Achieve Goals: Fair Progress towards PT goals: Progressing toward goals    Frequency           PT Plan Discharge plan needs to be updated    Co-evaluation  End of Session Equipment Utilized During Treatment: Gait belt (chair follow) Activity Tolerance: Patient tolerated treatment well Patient left: in chair;with call bell/phone within reach;with chair alarm set     Time: 1558-1640 PT Time Calculation (min) (ACUTE ONLY): 42 min  Charges:  $Gait Training: 8-22 mins $Therapeutic Activity: 23-37 mins                    G Codes:      Cristela Blue December 22, 2016, 4:53 PM  Governor Rooks, PTA pager [phone]

## 2016-11-23 LAB — CBC
HCT: 25.2 % — ABNORMAL LOW (ref 36.0–46.0)
Hemoglobin: 8.3 g/dL — ABNORMAL LOW (ref 12.0–15.0)
MCH: 29.7 pg (ref 26.0–34.0)
MCHC: 32.9 g/dL (ref 30.0–36.0)
MCV: 90.3 fL (ref 78.0–100.0)
PLATELETS: 166 10*3/uL (ref 150–400)
RBC: 2.79 MIL/uL — ABNORMAL LOW (ref 3.87–5.11)
RDW: 12.7 % (ref 11.5–15.5)
WBC: 4.4 10*3/uL (ref 4.0–10.5)

## 2016-11-23 LAB — BASIC METABOLIC PANEL
ANION GAP: 10 (ref 5–15)
BUN: 80 mg/dL — ABNORMAL HIGH (ref 6–20)
CALCIUM: 9.5 mg/dL (ref 8.9–10.3)
CO2: 31 mmol/L (ref 22–32)
Chloride: 94 mmol/L — ABNORMAL LOW (ref 101–111)
Creatinine, Ser: 4.69 mg/dL — ABNORMAL HIGH (ref 0.44–1.00)
GFR, EST AFRICAN AMERICAN: 9 mL/min — AB (ref >60)
GFR, EST NON AFRICAN AMERICAN: 8 mL/min — AB (ref >60)
Glucose, Bld: 175 mg/dL — ABNORMAL HIGH (ref 65–99)
Potassium: 4 mmol/L (ref 3.5–5.1)
SODIUM: 135 mmol/L (ref 135–145)

## 2016-11-23 LAB — GLUCOSE, CAPILLARY
GLUCOSE-CAPILLARY: 224 mg/dL — AB (ref 65–99)
GLUCOSE-CAPILLARY: 198 mg/dL — AB (ref 65–99)
Glucose-Capillary: 192 mg/dL — ABNORMAL HIGH (ref 65–99)
Glucose-Capillary: 209 mg/dL — ABNORMAL HIGH (ref 65–99)

## 2016-11-23 MED ORDER — SODIUM CHLORIDE 0.9 % IV SOLN
INTRAVENOUS | Status: DC
  Administered 2016-11-23 – 2016-11-27 (×3): via INTRAVENOUS

## 2016-11-23 NOTE — Progress Notes (Addendum)
Clinical Social Worker met patient at bedside to offer support and discuss patients needs at discharge. Patient daughter stated that patient lives at home by herself and daughter doesn't live far from patient. Patients daughter stated that patent does not want to discharge to SNF and would prefer home health. Patient agreed with daughter and stated she wants to stay with daughter after discharge. Patient's daughter stated she is a CNA and would be able to take care of patient. Daughter asked if insurance can pay her instead to take care of patient. CSW made daughter aware that insurance wont pay her for taking care of patient and that if they choose home health, Home health wont be able to provide 24/hr supervision. Patient and daughter is aware of Home health limitations but still chooses Home Health over SNF. Patients daughter stated she works for [employer] and would like to use that company for patient. CSW has let nurse case manager know of patients decision. CSW is signing off on patients. CSW remains available for support.  Rhea Pink, MSW,  [place]

## 2016-11-23 NOTE — Progress Notes (Signed)
[facility] Rounding note  Subjective: No c/o's, no sob or cough, creat up 4.4 today, UOP 800 cc  Objective: Vital signs in last 24 hours: Temp:  [98 F (36.7 C)-98.5 F (36.9 C)] 98.2 F (36.8 C) (12/27 1130) Pulse Rate:  [56-57] 57 (12/27 0739) Resp:  [12-18] 18 (12/27 1130) BP: (104-142)/(36-43) 138/38 (12/27 0739) SpO2:  [95 %-96 %] 95 % (12/27 0739) Weight:  [65 kg (143 lb 6.4 oz)] 65 kg (143 lb 6.4 oz) (12/27 0429) Weight change: 3.538 kg (7 lb 12.8 oz)  Intake/Output from previous day: 12/26 0701 - 12/27 0700 In: 600 [P.O.:600] Out: 700 [Urine:700] Intake/Output this shift: Total I/O In: 120 [P.O.:120] Out: -   General appearance: alert and cooperative Resp: clear bilat Cardio: RRR no mrg Abd: soft ntnd Extremities: no LE edema  Assessment: 1 Acute on CRF - likely volume low after diuresis, creat up slightly today, leveling off. Cont gentle IVF's.  2 CKD IV - f/b Dr Hinda Lenis / NO dialysis, confirmed by daughter 3 Pulm edema - mild, resolved after diuretics 4 DNR - pt request 5 HTN - stable , on 4 bp meds  Plan: cont IVF"s, creat in am   Kelly Splinter MD [facility] pgr [phone]   11/23/2016, 12:14 PM   Lab Results:  Recent Labs  11/22/16 0401 11/23/16 0536  WBC 4.3 4.4  HGB 8.2* 8.3*  HCT 25.0* 25.2*  PLT 148* 166   BMET:   Recent Labs  11/22/16 0822 11/23/16 0536  NA 136 135  K 4.3 4.0  CL 95* 94*  CO2 27 31  GLUCOSE 219* 175*  BUN 70* 80*  CREATININE 4.45* 4.69*  CALCIUM 9.6 9.5   No results for input(s): PTH in the last 72 hours. Iron Studies: No results for input(s): IRON, TIBC, TRANSFERRIN, FERRITIN in the last 72 hours. Studies/Results: No results found.  Scheduled: . amLODipine  10 mg Oral Daily  . aspirin EC  81 mg Oral Daily  . carvedilol  6.25 mg Oral BID WC  . cholecalciferol  1,000 Units Oral Daily  . cloNIDine  0.2 mg Oral TID  . hydrALAZINE  25 mg Oral Q8H  . insulin aspart   0-15 Units Subcutaneous TID WC  . isosorbide mononitrate  30 mg Oral Daily  . levothyroxine  50 mcg Oral QAC breakfast  . mouth rinse  15 mL Mouth Rinse BID  . pantoprazole  40 mg Oral BID  . rosuvastatin  10 mg Oral Daily

## 2016-11-23 NOTE — Progress Notes (Signed)
OT Cancellation Note  Patient Details Name: ALLYNE REPOSA MRN: [id number] DOB: 06-27-1932   Cancelled Treatment:    Reason Eval/Treat Not Completed: Other (comment) (currently working with PT). Will follow up for OT eval as time allows.  Binnie Kand M.S., OTR/L Pager: [phone]  11/23/2016, 2:30 PM

## 2016-11-23 NOTE — Progress Notes (Addendum)
Physical Therapy Treatment Patient Details Name: [name] MRN: [id number] DOB: Mar 18, 1932 Today's Date: 11/23/2016    History of Present Illness 82 yo admitted with angina and NSTEMI. PMHx: CAD, DM, HTN, CKD, CHF, CVA, right knee surgery, NSTEMI    PT Comments    Encouragement needed for participation in PT. Pt states "I am just so tired and want to nap." Pt ill-appearing with yellow tint to skin. HR 49 at rest, 72 during ambulation and 67 upon return to bed. Pt declined sitting up in recliner. Daughter present during session. Discussed discharge options at length. Informed that PT is recommending SNF. Daughter is considering staying with pt so she doesn't have to go to SNF. Daughter informed pt would need 24-hour family assist and HHPT to d/c home. Daughter verbalizes understanding of assist level pt would need at home.  Follow Up Recommendations  SNF     Equipment Recommendations  None recommended by PT    Recommendations for Other Services       Precautions / Restrictions Precautions Precautions: Fall    Mobility  Bed Mobility         Supine to sit: Min assist Sit to supine: Min assist   General bed mobility comments: +rail, increased time and effort to EOB. Total assist to scoot up in bed after ambulation.  Transfers   Equipment used: Rolling walker (2 wheeled) Transfers: Sit to/from Stand Sit to Stand: Min assist         General transfer comment: verbal cues for hand placement. Assist to power up.  Ambulation/Gait Ambulation/Gait assistance: Min assist Ambulation Distance (Feet): 150 Feet Assistive device: Rolling walker (2 wheeled) Gait Pattern/deviations: Decreased stride length;Step-through pattern Gait velocity: Pt with noticeable weakness. very slow gait with multi standing rest breaks.   General Gait Details: Verbal cues for posture. +2 utilized for chair follow but not needed.   Stairs            Wheelchair Mobility    Modified  Rankin (Stroke Patients Only)       Balance   Sitting-balance support: Feet supported Sitting balance-Leahy Scale: Good     Standing balance support: Bilateral upper extremity supported;During functional activity Standing balance-Leahy Scale: Fair                      Cognition Arousal/Alertness: Lethargic Behavior During Therapy: WFL for tasks assessed/performed Overall Cognitive Status: Within Functional Limits for tasks assessed                      Exercises      General Comments        Pertinent Vitals/Pain Pain Assessment: No/denies pain    Home Living                      Prior Function            PT Goals (current goals can now be found in the care plan section) Acute Rehab PT Goals Patient Stated Goal: return home PT Goal Formulation: With patient Time For Goal Achievement: 12/03/16 Potential to Achieve Goals: Fair Progress towards PT goals: Progressing toward goals    Frequency    Min 3X/week      PT Plan Current plan remains appropriate    Co-evaluation             End of Session Equipment Utilized During Treatment: Gait belt Activity Tolerance: Patient limited by lethargy;Patient limited by fatigue Patient  left: in bed;with bed alarm set;with family/visitor present;with call bell/phone within reach     Time: [id number] PT Time Calculation (min) (ACUTE ONLY): 37 min  Charges:  $Gait Training: 23-37 mins                    G Codes:      Lorriane Shire 11/23/2016, 2:53 PM

## 2016-11-23 NOTE — Progress Notes (Signed)
Patient Name: [name] Date of Encounter: 11/23/2016  Primary Cardiologist:   [facility] Problem List     Principal Problem:   ACS (acute coronary syndrome) [facility]) Active Problems:   Uncontrolled hypertension   CAD S/P PCI '03 and '08   Anemia in chronic kidney disease   Insulin dependent diabetes mellitus with complications (HCC)   Chronic kidney disease, stage IV (severe) (HCC)   Acute on chronic diastolic CHF (congestive heart failure), NYHA class 4 (HCC)   Pressure injury of skin     Subjective   She denies pain or SOB.    Creatinine continues to rise.   Inpatient Medications    Scheduled Meds: . amLODipine  10 mg Oral Daily  . aspirin EC  81 mg Oral Daily  . carvedilol  6.25 mg Oral BID WC  . cholecalciferol  1,000 Units Oral Daily  . cloNIDine  0.2 mg Oral TID  . hydrALAZINE  25 mg Oral Q8H  . insulin aspart  0-15 Units Subcutaneous TID WC  . isosorbide mononitrate  30 mg Oral Daily  . levothyroxine  50 mcg Oral QAC breakfast  . mouth rinse  15 mL Mouth Rinse BID  . pantoprazole  40 mg Oral BID  . rosuvastatin  10 mg Oral Daily   Continuous Infusions:  PRN Meds: acetaminophen, ALPRAZolam, gi cocktail, nitroGLYCERIN, ondansetron (ZOFRAN) IV, zolpidem   Vital Signs    Vitals:   11/22/16 2134 11/23/16 0020 11/23/16 0429 11/23/16 0739  BP: (!) 132/43 (!) 139/41 (!) 142/40 (!) 138/38  Pulse: (!) 57 (!) 57 (!) 56 (!) 57  Resp:    14  Temp: 98.5 F (36.9 C) 98.5 F (36.9 C) 98.4 F (36.9 C) 98.3 F (36.8 C)  TempSrc: Oral Oral Oral Oral  SpO2: 95% 96% 96% 95%  Weight:   143 lb 6.4 oz (65 kg)   Height:        Intake/Output Summary (Last 24 hours) at 11/23/16 0907 Last data filed at 11/23/16 [id number]  Gross per 24 hour  Intake              480 ml  Output              700 ml  Net             -220 ml   Filed Weights   11/21/16 0500 11/22/16 0400 11/23/16 0429  Weight: 140 lb 14.4 oz (63.9 kg) 135 lb 9.6 oz (61.5 kg) 143 lb 6.4 oz (65  kg)    Physical Exam    GEN: NAD.  Neck:  No JVD Cardiac: Regular Rate and Rhythm, no murmurs, rubs, or gallops.  Mild right greater than left edema.  Radials/DP/PT 2+  and equal bilaterally.  Respiratory:  Respirations  regular and unlabored, clear to auscultation bilaterally. GI: Soft, nontender, nondistended, BS + x 4. Skin: warm and dry, no rash. Neuro:  Non focal.   Generally weak .  Psych:  AAOx3.  Normal affect.  Labs    CBC  Recent Labs  11/21/16 0331 11/22/16 0401  WBC 4.2 4.3  HGB 7.9* 8.2*  HCT 24.5* 25.0*  MCV 92.8 90.9  PLT 141* 123456*   Basic Metabolic Panel  Recent Labs  11/22/16 0822 11/23/16 0536  NA 136 135  K 4.3 4.0  CL 95* 94*  CO2 27 31  GLUCOSE 219* 175*  BUN 70* 80*  CREATININE 4.45* 4.69*  CALCIUM 9.6 9.5   Liver Function Tests  No results for input(s): AST, ALT, ALKPHOS, BILITOT, PROT, ALBUMIN in the last 72 hours. No results for input(s): LIPASE, AMYLASE in the last 72 hours. Cardiac Enzymes No results for input(s): CKTOTAL, CKMB, CKMBINDEX, TROPONINI in the last 72 hours. BNP Invalid input(s): POCBNP D-Dimer No results for input(s): DDIMER in the last 72 hours. Hemoglobin A1C No results for input(s): HGBA1C in the last 72 hours. Fasting Lipid Panel No results for input(s): CHOL, HDL, LDLCALC, TRIG, CHOLHDL, LDLDIRECT in the last 72 hours. Thyroid Function Tests No results for input(s): TSH, T4TOTAL, T3FREE, THYROIDAB in the last 72 hours.  Invalid input(s): FREET3  Telemetry    NSR - Personally Reviewed  ECG     - Personally Reviewed  Radiology    No results found.  Cardiac Studies   Renal ultrasound:  Mild right-sided hydronephrosis.  Three parapelvic left renal cysts with the largest measuring 2.8 cm over the lower pole.  Patient Profile     82 y.o.year old femalewith a history of DM, HTN, and CKD IV,LAD POBA in '03 and an LAD DES and OM DES in '08. Recently admitted 11/08-11/10/17,w/ chest pain, SBP  >200. Ez neg, CP resolved w/ IV NTG>>improved BP control. BB decreased 2nd HR 40s, clonidine patch added. 11/08/16, pt seen with Diast CHF wt 150 lbs, +LE edema, Lasix changed to torsemide. Adm 11/17/16 with CP, SOB.   Assessment & Plan    CAD:  Pt admitted with NSTEMI.   The patient did not want a cardiac cath.  Medical management on ASA and beta blocker.   No further CP   HTN:  BP is well controlled on current meds.    ACUTE ON CHRONIC SYSTOLIC HF:   Creatinine has continued to increase after diuresis.   Nephrology is on board    CHRONIC KIDNEY DISEASE STAGE IV:   Nephrology consulted.  Creat is increasing but mildly.  Elevated potassium is resolved  URINARY RETENTION:    The patient has required in an out cath secondary to urinary retenion.  Follow today.   Consider urology consult in AM if still ongoing.   PAF:   Maintaining NSR.  Not an anticoagulation candidate at this point.   Placement:   Will get PT/OT consult for assistance in determining where she should go at discharge.     Mertie Moores, MD  11/23/2016 9:07 AM    [place],  [place], [place]  [address] Pager [phone] Phone: [phone]; Fax: [phone]

## 2016-11-24 ENCOUNTER — Inpatient Hospital Stay (HOSPITAL_COMMUNITY): Payer: Medicare Other

## 2016-11-24 DIAGNOSIS — N898 Other specified noninflammatory disorders of vagina: Secondary | ICD-10-CM

## 2016-11-24 DIAGNOSIS — G9340 Encephalopathy, unspecified: Secondary | ICD-10-CM

## 2016-11-24 DIAGNOSIS — R339 Retention of urine, unspecified: Secondary | ICD-10-CM

## 2016-11-24 LAB — URINALYSIS, ROUTINE W REFLEX MICROSCOPIC
BILIRUBIN URINE: NEGATIVE
Glucose, UA: NEGATIVE mg/dL
HGB URINE DIPSTICK: NEGATIVE
Ketones, ur: NEGATIVE mg/dL
NITRITE: NEGATIVE
PH: 6 (ref 5.0–8.0)
Protein, ur: 100 mg/dL — AB
SPECIFIC GRAVITY, URINE: 1.012 (ref 1.005–1.030)

## 2016-11-24 LAB — CBC
HEMATOCRIT: 23.4 % — AB (ref 36.0–46.0)
Hemoglobin: 7.8 g/dL — ABNORMAL LOW (ref 12.0–15.0)
MCH: 29.9 pg (ref 26.0–34.0)
MCHC: 33.3 g/dL (ref 30.0–36.0)
MCV: 89.7 fL (ref 78.0–100.0)
PLATELETS: 155 10*3/uL (ref 150–400)
RBC: 2.61 MIL/uL — ABNORMAL LOW (ref 3.87–5.11)
RDW: 12.6 % (ref 11.5–15.5)
WBC: 4.2 10*3/uL (ref 4.0–10.5)

## 2016-11-24 LAB — BASIC METABOLIC PANEL
ANION GAP: 11 (ref 5–15)
BUN: 87 mg/dL — AB (ref 6–20)
CALCIUM: 9 mg/dL (ref 8.9–10.3)
CO2: 26 mmol/L (ref 22–32)
CREATININE: 4.62 mg/dL — AB (ref 0.44–1.00)
Chloride: 95 mmol/L — ABNORMAL LOW (ref 101–111)
GFR calc Af Amer: 9 mL/min — ABNORMAL LOW (ref >60)
GFR, EST NON AFRICAN AMERICAN: 8 mL/min — AB (ref >60)
GLUCOSE: 203 mg/dL — AB (ref 65–99)
Potassium: 4.4 mmol/L (ref 3.5–5.1)
Sodium: 132 mmol/L — ABNORMAL LOW (ref 135–145)

## 2016-11-24 LAB — GLUCOSE, CAPILLARY
GLUCOSE-CAPILLARY: 163 mg/dL — AB (ref 65–99)
Glucose-Capillary: 204 mg/dL — ABNORMAL HIGH (ref 65–99)
Glucose-Capillary: 210 mg/dL — ABNORMAL HIGH (ref 65–99)
Glucose-Capillary: 119 mg/dL — ABNORMAL HIGH (ref 65–99)

## 2016-11-24 MED ORDER — FLUCONAZOLE 50 MG PO TABS
50.0000 mg | ORAL_TABLET | Freq: Every day | ORAL | Status: DC
  Administered 2016-11-24 – 2016-11-26 (×3): 50 mg via ORAL
  Filled 2016-11-24 (×4): qty 1

## 2016-11-24 MED ORDER — MAGNESIUM HYDROXIDE 400 MG/5ML PO SUSP
30.0000 mL | Freq: Every day | ORAL | Status: DC | PRN
  Administered 2016-11-25: 30 mL via ORAL
  Filled 2016-11-24: qty 30

## 2016-11-24 MED ORDER — INSULIN GLARGINE 100 UNIT/ML ~~LOC~~ SOLN
5.0000 [IU] | Freq: Every day | SUBCUTANEOUS | Status: DC
  Administered 2016-11-24 – 2016-11-25 (×2): 5 [IU] via SUBCUTANEOUS
  Filled 2016-11-24 (×3): qty 0.05

## 2016-11-24 NOTE — Progress Notes (Signed)
Patient Name: [name] Date of Encounter: 11/24/2016  Primary Cardiologist:   [facility] Problem List     Principal Problem:   ACS (acute coronary syndrome) [facility]) Active Problems:   Uncontrolled hypertension   CAD S/P PCI '03 and '08   Anemia in chronic kidney disease   Insulin dependent diabetes mellitus with complications (HCC)   Chronic kidney disease, stage IV (severe) (HCC)   Acute on chronic diastolic CHF (congestive heart failure), NYHA class 4 (HCC)   Pressure injury of skin     Subjective   She denies pain or SOB.    Still requiring straight cath to urinate  Inpatient Medications    Scheduled Meds: . amLODipine  10 mg Oral Daily  . aspirin EC  81 mg Oral Daily  . carvedilol  6.25 mg Oral BID WC  . cholecalciferol  1,000 Units Oral Daily  . cloNIDine  0.2 mg Oral TID  . hydrALAZINE  25 mg Oral Q8H  . insulin aspart  0-15 Units Subcutaneous TID WC  . isosorbide mononitrate  30 mg Oral Daily  . levothyroxine  50 mcg Oral QAC breakfast  . mouth rinse  15 mL Mouth Rinse BID  . pantoprazole  40 mg Oral BID  . rosuvastatin  10 mg Oral Daily   Continuous Infusions: . sodium chloride 65 mL/hr at 11/24/16 0500   PRN Meds: acetaminophen, ALPRAZolam, gi cocktail, magnesium hydroxide, nitroGLYCERIN, ondansetron (ZOFRAN) IV, zolpidem   Vital Signs    Vitals:   11/23/16 2139 11/23/16 2248 11/24/16 0339 11/24/16 0737  BP: (!) 132/49 (!) 141/47 (!) 153/48 (!) 137/44  Pulse: (!) 57 (!) 58 63 (!) 52  Resp: 16  18 18   Temp: 99.2 F (37.3 C)  100.2 F (37.9 C) 98 F (36.7 C)  TempSrc: Oral  Oral Oral  SpO2: 95% 97% 97% 97%  Weight:   145 lb 4.8 oz (65.9 kg)   Height:        Intake/Output Summary (Last 24 hours) at 11/24/16 0858 Last data filed at 11/24/16 0846  Gross per 24 hour  Intake          1213.41 ml  Output              675 ml  Net           538.41 ml   Filed Weights   11/22/16 0400 11/23/16 0429 11/24/16 0339  Weight: 135 lb 9.6  oz (61.5 kg) 143 lb 6.4 oz (65 kg) 145 lb 4.8 oz (65.9 kg)    Physical Exam    GEN: NAD.  Neck:  No JVD Cardiac: Regular Rate and Rhythm, no murmurs, rubs, or gallops.  Mild right greater than left edema.  Radials/DP/PT 2+  and equal bilaterally.  Respiratory:  Respirations  regular and unlabored, clear to auscultation bilaterally. GI: Soft, nontender, nondistended, BS + x 4. Skin: warm and dry, no rash. Neuro:  Non focal.   Generally weak .  Psych:  AAOx3.  Normal affect.  Labs    CBC  Recent Labs  11/23/16 0536 11/24/16 0302  WBC 4.4 4.2  HGB 8.3* 7.8*  HCT 25.2* 23.4*  MCV 90.3 89.7  PLT 166 [id number]   Basic Metabolic Panel  Recent Labs  11/23/16 0536 11/24/16 0302  NA 135 132*  K 4.0 4.4  CL 94* 95*  CO2 31 26  GLUCOSE 175* 203*  BUN 80* 87*  CREATININE 4.69* 4.62*  CALCIUM 9.5 9.0   Liver  Function Tests No results for input(s): AST, ALT, ALKPHOS, BILITOT, PROT, ALBUMIN in the last 72 hours. No results for input(s): LIPASE, AMYLASE in the last 72 hours. Cardiac Enzymes No results for input(s): CKTOTAL, CKMB, CKMBINDEX, TROPONINI in the last 72 hours. BNP Invalid input(s): POCBNP D-Dimer No results for input(s): DDIMER in the last 72 hours. Hemoglobin A1C No results for input(s): HGBA1C in the last 72 hours. Fasting Lipid Panel No results for input(s): CHOL, HDL, LDLCALC, TRIG, CHOLHDL, LDLDIRECT in the last 72 hours. Thyroid Function Tests No results for input(s): TSH, T4TOTAL, T3FREE, THYROIDAB in the last 72 hours.  Invalid input(s): FREET3  Telemetry    NSR - Personally Reviewed  ECG     - Personally Reviewed  Radiology    No results found.  Cardiac Studies   Renal ultrasound:  Mild right-sided hydronephrosis.  Three parapelvic left renal cysts with the largest measuring 2.8 cm over the lower pole.  Patient Profile     82 y.o.year old femalewith a history of DM, HTN, and CKD IV,LAD POBA in '03 and an LAD DES and OM DES in '08.  Recently admitted 11/08-11/10/17,w/ chest pain, SBP >200. Ez neg, CP resolved w/ IV NTG>>improved BP control. BB decreased 2nd HR 40s, clonidine patch added. 11/08/16, pt seen with Diast CHF wt 150 lbs, +LE edema, Lasix changed to torsemide. Adm 11/17/16 with CP, SOB.   Assessment & Plan    CAD:  Pt admitted with NSTEMI.   The patient did not want a cardiac cath.  Medical management on ASA and beta blocker.   No further CP   HTN:  BP is well controlled on current meds.    ACUTE ON CHRONIC SYSTOLIC HF:   Creatinine has continued to increase after diuresis.   Nephrology is on board    CHRONIC KIDNEY DISEASE STAGE IV:   Nephrology consulted.  Creat appears to have platued.  Rise in creatinine likely due to over diuresis.  Elevated potassium is resolved.  Continue gentle hydration.  Patient does not want HD.  URINARY RETENTION:    The patient has required in an out cath secondary to urinary retenion.  This has continued so will ask Urology to see.     PAF:   Maintaining NSR.  Not an anticoagulation candidate at this point.   Placement:   Will get PT/OT consult for assistance in determining where she should go at discharge.     Fransico Him, MD  11/24/2016 8:58 AM    [place],  [place], [place]  [address] Pager [phone] Phone: [phone]; Fax: [phone]

## 2016-11-24 NOTE — Progress Notes (Signed)
Per Dr. Theodosia Blender request IM consult called for low grade fever and vaginal dc.

## 2016-11-24 NOTE — Consult Note (Signed)
   [facility] CM Inpatient Consult   11/24/2016  [name] [birth year] [id number]   Patient is currently active with [facility] for chronic disease management services.  Patient has been engaged by a [employer].  Our community based plan of care has focused on disease management and community resource support.  Patient will receive a post discharge transition of care call and will be evaluated for monthly home visits for assessments and disease process education.  Made Inpatient Case Manager aware that [facility] following. Of note, [facility] Care Management services does not replace or interfere with any services that are needed or arranged by inpatient case management or social work.  For additional questions or referrals please contact:  Dayane Hillenburg RN, [facility] Liaison  [phone]) [place] [phone]) Toll free office

## 2016-11-24 NOTE — Progress Notes (Signed)
[facility] Rounding note  Subjective: No c/o's, no sob or cough, creat plateau'd at 4.6 today.  UOP 650 cc.  Per PT will need SNF.   Objective: Vital signs in last 24 hours: Temp:  [98 F (36.7 C)-100.2 F (37.9 C)] 98 F (36.7 C) (12/28 0737) Pulse Rate:  [52-63] 52 (12/28 0737) Resp:  [16-18] 18 (12/28 0737) BP: (131-153)/(43-49) 137/44 (12/28 0737) SpO2:  [95 %-97 %] 97 % (12/28 0737) Weight:  [65.9 kg (145 lb 4.8 oz)] 65.9 kg (145 lb 4.8 oz) (12/28 0339) Weight change: 0.862 kg (1 lb 14.4 oz)  Intake/Output from previous day: 12/27 0701 - 12/28 0700 In: 1213.4 [P.O.:480; I.V.:733.4] Out: 675 [Urine:675] Intake/Output this shift: Total I/O In: 240 [P.O.:120; Other:120] Out: -  Exam: General appearance: alert and cooperative Resp: clear bilat Cardio: RRR no mrg Abd: soft ntnd Extremities: no LE edema  Assessment: 1 Acute on CKD4 - due to vol depletion after diuresis. Creat stabilized mid 4's now. Baseline creat = 2.6- 3.1.  Would continue IVF another 24-48 hrs then stop. Cont to hold demadex but if dc'd home should be restarted at DC.  She sees Dr Hinda Lenis in [place] for CKD and should f/u with him after dc.  No dialysis (pt request). DNR.  Conservative care.  Will sign off.  2 Pulm edema - mild, resolved after diuretics 3 HTN - on 4 bp meds  Plan: as above   Kelly Splinter MD [employer] pgr [phone]   11/23/2016, 12:14 PM   Lab Results:  Recent Labs  11/23/16 0536 11/24/16 0302  WBC 4.4 4.2  HGB 8.3* 7.8*  HCT 25.2* 23.4*  PLT 166 155   BMET:   Recent Labs  11/23/16 0536 11/24/16 0302  NA 135 132*  K 4.0 4.4  CL 94* 95*  CO2 31 26  GLUCOSE 175* 203*  BUN 80* 87*  CREATININE 4.69* 4.62*  CALCIUM 9.5 9.0   No results for input(s): PTH in the last 72 hours. Iron Studies: No results for input(s): IRON, TIBC, TRANSFERRIN, FERRITIN in the last 72 hours. Studies/Results: No results found.  Scheduled: .  amLODipine  10 mg Oral Daily  . aspirin EC  81 mg Oral Daily  . carvedilol  6.25 mg Oral BID WC  . cholecalciferol  1,000 Units Oral Daily  . cloNIDine  0.2 mg Oral TID  . hydrALAZINE  25 mg Oral Q8H  . insulin aspart  0-15 Units Subcutaneous TID WC  . isosorbide mononitrate  30 mg Oral Daily  . levothyroxine  50 mcg Oral QAC breakfast  . mouth rinse  15 mL Mouth Rinse BID  . pantoprazole  40 mg Oral BID  . rosuvastatin  10 mg Oral Daily

## 2016-11-24 NOTE — Evaluation (Signed)
Occupational Therapy Evaluation Patient Details Name: [name] MRN: [id number] DOB: July 28, 1932 Today's Date: 11/24/2016    History of Present Illness 82 yo admitted with angina and NSTEMI. PMHx: CAD, DM, HTN, CKD, CHF, CVA, right knee surgery, NSTEMI   Clinical Impression   Pt independent with ADL PTA. Currently pt overall total assist for ADL and max assist +2 for basic transfers. Pt lethargic today with poor sitting/standing balance. Recommending SNF for follow up to maximize independence and safety with ADL and functional mobility. Noted pt and family refusal of SNF; but continue to feel it is the safest d/c option for pt. Pt would benefit from continued skilled OT to address established goals.    Follow Up Recommendations  SNF;Supervision/Assistance - 24 hour    Equipment Recommendations  Other (comment) (TBD)    Recommendations for Other Services       Precautions / Restrictions Precautions Precautions: Fall Precaution Comments: incontinent Restrictions Weight Bearing Restrictions: No      Mobility Bed Mobility Overal bed mobility: Needs Assistance Bed Mobility: Supine to Sit     Supine to sit: Mod assist     General bed mobility comments: Mod assist for LEs to EOB, trunk elevation to sitting, and scooting hips out to EOB.  Transfers Overall transfer level: Needs assistance Equipment used: Rolling walker (2 wheeled) Transfers: Sit to/from [facility] Sit to Stand: Max assist;+2 physical assistance Stand pivot transfers: Max assist;+2 physical assistance       General transfer comment: Cues for hand placement and technique. Pt reports she is unable to move feet once in standing.    Balance Overall balance assessment: Needs assistance Sitting-balance support: Feet supported;Bilateral upper extremity supported Sitting balance-Leahy Scale: Poor Sitting balance - Comments: Pt with poor trunk control and intermittenlty loses balance  posteriorly. Postural control: Posterior lean Standing balance support: Bilateral upper extremity supported Standing balance-Leahy Scale: Zero Standing balance comment: bil UE support and max external assist                            ADL Overall ADL's : Needs assistance/impaired                         Toilet Transfer: Maximal assistance;+2 for physical assistance;Stand-pivot;RW Toilet Transfer Details (indicate cue type and reason): Simulated by stand pivot to chair         Functional mobility during ADLs: Maximal assistance;+2 for physical assistance;Rolling walker (for stand pivot only) General ADL Comments: Pt currenlty total assist overall for ADL. Pt lethargic this session and reports she cannot move feet when she is weight bearing on them. Also having difficulty holding RW with bil UEs.      Vision     Perception     Praxis      Pertinent Vitals/Pain Pain Assessment: No/denies pain     Hand Dominance     Extremity/Trunk Assessment Upper Extremity Assessment Upper Extremity Assessment: Generalized weakness   Lower Extremity Assessment Lower Extremity Assessment: Defer to PT evaluation   Cervical / Trunk Assessment Cervical / Trunk Assessment: Kyphotic   Communication Communication Communication: No difficulties   Cognition Arousal/Alertness: Lethargic Behavior During Therapy: Flat affect Overall Cognitive Status: Difficult to assess                     General Comments       Exercises       Shoulder  Instructions      Home Living Family/patient expects to be discharged to:: Private residence Living Arrangements: Alone Available Help at Discharge: Family;Available PRN/intermittently Type of Home: House Home Access: Stairs to enter [employer] of Steps: 4 Entrance Stairs-Rails: Left Home Layout: One level     Bathroom Shower/Tub: Teacher, early years/pre: Standard     Home Equipment:  Environmental consultant - 2 wheels;Cane - single point;Bedside commode          Prior Functioning/Environment Level of Independence: Independent        Comments: pt cares for herself, doesn't really cook or drive, daughter checks in a few times a day and brings food        OT Problem List: Decreased activity tolerance;Impaired balance (sitting and/or standing);Decreased cognition;Decreased safety awareness;Decreased knowledge of use of DME or AE;Decreased knowledge of precautions   OT Treatment/Interventions: Self-care/ADL training;Energy conservation;DME and/or AE instruction;Therapeutic activities;Patient/family education;Balance training    OT Goals(Current goals can be found in the care plan section) Acute Rehab OT Goals Patient Stated Goal: none stated OT Goal Formulation: With patient Time For Goal Achievement: 12/08/16 Potential to Achieve Goals: Good ADL Goals Pt Will Perform Grooming: with min guard assist;standing Pt Will Perform Upper Body Bathing: with set-up;sitting Pt Will Perform Lower Body Bathing: with min guard assist;sit to/from stand Pt Will Transfer to Toilet: with min guard assist;ambulating;bedside commode  OT Frequency: Min 2X/week   Barriers to D/C: Decreased caregiver support  pt lives alone       Co-evaluation              End of Session Equipment Utilized During Treatment: Gait belt;Rolling walker Nurse Communication: Mobility status  Activity Tolerance: Patient limited by lethargy Patient left: in chair;with call bell/phone within reach;with chair alarm set;with nursing/sitter in room   Time: 1441-1501 OT Time Calculation (min): 20 min Charges:  OT General Charges $OT Visit: 1 Procedure OT Evaluation $OT Eval Moderate Complexity: 1 Procedure G-Codes:     Binnie Kand M.S., OTR/L Pager: [phone]  11/24/2016, 3:17 PM

## 2016-11-24 NOTE — Progress Notes (Signed)
Pt temp 100.6, tylenol given, noted that pt has mild confusion and vaginal discharge, MD notified, new orders received and implemented.  Edward Qualia RN

## 2016-11-24 NOTE — Consult Note (Addendum)
[facility] Medical Consultation  [name] [id number] DOB: [birth year] DOA: 11/17/2016 PCP: Kenn File, MD   Requesting physician: Dr Radford Pax Date of consultation: 12/28 Reason for consultation: low grade fer and vaginal discharge   Chief Complaint: low grade fever and vaginal discharge  HPI:  -year-old female with history of insulin-dependent diabetes mellitus, hypertension, chronic kidney disease stage IV coronary artery disease with LAD balloon angioplasty in 2003 and LAD DS and OM DES in 2008. She was admitted in 2013 for an NSTEMI but declined cardiac cath and was medically managed. Patient was hospitalized about 6 weeks back back with chest pain, was ruled out for ACS but found to have accelerated hypertension. Patient returned to the ED on 12/21 with lower extremity edema or >2 weeks, increase dyspnea on exertion and woke up with chest pain symptoms. She was diaphoretic with EKG showing lateral ST depression and inverted T waves. Patient was admitted for NSTEMI. She refused cardiac cath and was being managed medically. No further chest pain symptoms. She was also diuresed with IV Lasix for acute on chronic systolic CHF. She however developed acute on chronic kidney disease, diuretics held and based on gentle hydration. Nephrology was consulted. Hospital course also complicated with urinary retention requiring frequent in and out catheterization for right Foley placement and now with low-grade fever and vaginal discharge.   Hospitalist consulted for low-grade fever with vaginal discharge noted since yesterday.  Patient has fever of 100.36F, blood pressure and pulse stable. Patient not very well oriented and appears somnolent and somewhat confused. Upon discussion with the nurse this is new from yesterday. Patient denies any chest discomfort, shortness of breath, headache, dizziness, nausea, vomiting, abdominal pain, dysuria or diarrhea. Denies any weakness in her  extremities.  Labs this morning shows normal WBC, hemoglobin of 7.8, sodium 132, chloride 95, BUN of 87 and creatinine 4.62. Blood glucose of 203.    Impression/Recommendations Principal Problem:   ACS (acute coronary syndrome) ([place]) Pt refused cath. Medical management per primary team. On ASA, coreg and statin.  Active Problems: Vaginal discharge with low grade fever and  confusion Possible underlying infectious process. Check UA, urine cx and CXR. Patient appears more confused today per nurse and not able to answer questions appropriately. -has curdy vaginal discharge since 12/25. denies any discomfort. Will place on 7 day course of diflucan ( renally dosed).     Acute on chronic diastolic CHF (congestive heart failure), NYHA class 4 ([place]) Secondary to aggressive diuresis. Renal on board. getting IV fluids. Appears creatinine plateaued and hopefully trend down.    Uncontrolled hypertension Stable.  Diabetes mellitus type 2  on sliding scale coverage. On lantus at home.. cbg 180-220. Will add low dose lantus.( 5 units at bedtime). A1C of 6.1.    Anemia in chronic kidney disease Stable.  ? Urinary retention  foley placed on /12/25. Urology consulted planned y primary team     We  will followup again tomorrow. Please contact me if I can be of assistance in the meanwhile. Thank you for this consultation.    Review of Systems:  As outlined in history of present illness. 12 point review of systems unremarkable and limited due to patient's somnolence and confusion.  Past Medical History:  Diagnosis Date  . CAD (coronary artery disease) 2003   Last catheterization 2008. Two-vessel PCI of the first OM branch and mid LAD.  Marland Kitchen CKD (chronic kidney disease)   . Congestive heart failure ([place])   . CVA (cerebral vascular  accident) ([place]) [place]   . Dementia   . DJD (degenerative joint disease) of lumbar spine   . Hypercholesteremia   . Hypertension   . IDDM (insulin  dependent diabetes mellitus) ([place])    Past Surgical History:  Procedure Laterality Date  . APPENDECTOMY    . KNEE SURGERY     Social History:  reports that she has never smoked. She has never used smokeless tobacco. She reports that she does not drink alcohol or use drugs.  Allergies  Allergen Reactions  . Propoxyphene N-Acetaminophen     Pt does not know reaction  . Simvastatin Other (See Comments)    headache   Family History  Problem Relation Age of Onset  . Heart attack Mother 75  . Heart attack Father 90  . Diabetes Brother     RETINOPATHY   . Drug abuse Brother   . Liver cancer Brother   . Breast cancer Daughter   . Diabetes Sister     Prior to Admission medications   Medication Sig Start Date End Date Taking? Authorizing Provider  amLODipine (NORVASC) 10 MG tablet Take 1 tablet (10 mg total) by mouth daily. 10/04/16  Yes Timmothy Euler, MD  aspirin EC 81 MG EC tablet Take 1 tablet (81 mg total) by mouth daily. 10/08/16  Yes Bhavinkumar Bhagat, PA  cholecalciferol (VITAMIN D) 1000 units tablet Take 1,000 Units by mouth daily.   Yes Historical Provider, MD  diclofenac sodium (VOLTAREN) 1 % GEL Apply 4 g topically 4 (four) times daily. 07/29/16  Yes Timmothy Euler, MD  Insulin Glargine (LANTUS) 100 UNIT/ML Solostar Pen Inject 20-30 Units into the skin daily at 10 pm. 10/17/16  Yes Timmothy Euler, MD  isosorbide mononitrate (IMDUR) 120 MG 24 hr tablet TAKE 1 TABLET DAILY 05/12/16  Yes Timmothy Euler, MD  levothyroxine (SYNTHROID, LEVOTHROID) 50 MCG tablet Take 1 tablet (50 mcg total) by mouth daily. 08/24/16  Yes Fransisca Kaufmann Dettinger, MD  lisinopril (PRINIVIL,ZESTRIL) 40 MG tablet Take 1 tablet (40 mg total) by mouth daily. Patient taking differently: Take 20 mg by mouth daily.  01/28/16  Yes Timmothy Euler, MD  metoprolol succinate (TOPROL-XL) 25 MG 24 hr tablet Take 0.5 tablets (12.5 mg total) by mouth daily. 10/27/16  Yes Timmothy Euler, MD  nitroGLYCERIN  (NITROSTAT) 0.4 MG SL tablet Place 1 tablet (0.4 mg total) under the tongue every 5 (five) minutes x 3 doses as needed for chest pain. 02/24/16  Yes Timmothy Euler, MD  rosuvastatin (CRESTOR) 10 MG tablet Take 1 tablet (10 mg total) by mouth daily. 10/17/16  Yes Timmothy Euler, MD  torsemide (DEMADEX) 20 MG tablet Take 1 tablet (20 mg total) by mouth daily. 11/08/16  Yes Timmothy Euler, MD  cloNIDine (CATAPRES) 0.2 MG tablet Take 1 tablet (0.2 mg total) by mouth 3 (three) times daily. 10/27/16   Timmothy Euler, MD   Physical Exam: Blood pressure (!) 158/71, pulse 65, temperature 99.2 F (37.3 C), temperature source Oral, resp. rate 16, height 5\' 4"  (1.626 m), weight 65.9 kg (145 lb 4.8 oz), SpO2 97 %. Vitals:   11/24/16 0737 11/24/16 1145  BP: (!) 137/44 (!) 158/71  Pulse: (!) 52 65  Resp: 18 16  Temp: 98 F (36.7 C) 99.2 F (37.3 C)     General:  Elderly female appears somnolent, easily arousable, not in distress  HEENT: No pallor, moist mucosa, supple neck  Chest: Clear to auscultation  bilaterally, no added sounds  CVS: S1 and S2 irregular, no murmurs or gallop  GI: Soft, nondistended, nontender, bowel sounds present, 30 vaginal discharge noted, no Vulva erythema or swelling , Foley with clear urine.  Musculoskeletal: Warm, no edema  CNS: Somnolent but easily arousable, oriented 2  Labs on Admission:  Basic Metabolic Panel:  Recent Labs Lab 11/20/16 0328 11/21/16 0331 11/22/16 0822 11/23/16 0536 11/24/16 0302  NA 136 137 136 135 132*  K 4.4 4.2 4.3 4.0 4.4  CL 99* 97* 95* 94* 95*  CO2 26 30 27 31 26   GLUCOSE 190* 143* 219* 175* 203*  BUN 49* 54* 70* 80* 87*  CREATININE 3.30* 3.94* 4.45* 4.69* 4.62*  CALCIUM 9.7 9.6 9.6 9.5 9.0   Liver Function Tests:  Recent Labs Lab 11/18/16 0538  AST 11*  ALT 8*  ALKPHOS 39  BILITOT 0.5  PROT 6.0*  ALBUMIN 3.5    Recent Labs Lab 11/18/16 1613  LIPASE 16  AMYLASE 25*   No results for input(s):  AMMONIA in the last 168 hours. CBC:  Recent Labs Lab 11/20/16 0328 11/21/16 0331 11/22/16 0401 11/23/16 0536 11/24/16 0302  WBC 5.6 4.2 4.3 4.4 4.2  HGB 8.2* 7.9* 8.2* 8.3* 7.8*  HCT 25.7* 24.5* 25.0* 25.2* 23.4*  MCV 92.1 92.8 90.9 90.3 89.7  PLT 162 141* 148* 166 155   Cardiac Enzymes:  Recent Labs Lab 11/17/16 1820 11/18/16 0030 11/18/16 0538  TROPONINI 0.05* 0.08* 0.08*   BNP: Invalid input(s): POCBNP CBG:  Recent Labs Lab 11/23/16 1604 11/23/16 2137 11/24/16 0738 11/24/16 1148 11/24/16 1636  GLUCAP 198* 209* 204* 210* 163*    Radiological Exams on Admission: No results found.   Time spent: 50 minutes  Louellen Molder [facility] Pager [phone]  If 7PM-7AM, please contact night-coverage [email or web address] Password [facility] 11/24/2016, 4:56 PM

## 2016-11-24 NOTE — Progress Notes (Signed)
Inpatient Diabetes Program Recommendations  AACE/ADA: New Consensus Statement on Inpatient Glycemic Control (2015)  Target Ranges:  Prepandial:   less than 140 mg/dL      Peak postprandial:   less than 180 mg/dL (1-2 hours)      Critically ill patients:  140 - 180 mg/dL   Results for NAJAYAH, KAMINER (MRN [id number]) as of 11/24/2016 11:24  Ref. Range 11/23/2016 07:25 11/23/2016 11:28 11/23/2016 16:04 11/23/2016 21:37 11/24/2016 07:38  Glucose-Capillary Latest Ref Range: 65 - 99 mg/dL 192 (H) 224 (H) 198 (H) 209 (H) 204 (H)  Results for HIDAYAH, TRAPHAGEN (MRN [id number]) as of 11/24/2016 11:24  Ref. Range 11/17/2016 18:20  Hemoglobin A1C Latest Ref Range: 4.8 - 5.6 % 6.1 (H)   Review of Glycemic Control  Outpatient Diabetes medications: Lantus 20-30 units QHS Current orders for Inpatient glycemic control: Novolog 0-15 units TID with meals  Inpatient Diabetes Program Recommendations: Insulin - Basal: Please consider ordering low dose basal insulin. Recommend starting with lantus 6 units Q24H starting now. Correction (SSI): Please consider ordering Novolog 0-5 units QHS for bedtime correction scale.  Thanks, Barnie Alderman, RN, MSN, CDE Diabetes Coordinator Inpatient Diabetes Program [phone] (Team Pager from 8am to 5pm)

## 2016-11-25 DIAGNOSIS — D709 Neutropenia, unspecified: Secondary | ICD-10-CM

## 2016-11-25 DIAGNOSIS — N390 Urinary tract infection, site not specified: Secondary | ICD-10-CM

## 2016-11-25 DIAGNOSIS — R5081 Fever presenting with conditions classified elsewhere: Secondary | ICD-10-CM

## 2016-11-25 LAB — IRON AND TIBC
Iron: 17 ug/dL — ABNORMAL LOW (ref 28–170)
Saturation Ratios: 10 % — ABNORMAL LOW (ref 10.4–31.8)
TIBC: 165 ug/dL — ABNORMAL LOW (ref 250–450)
UIBC: 148 ug/dL

## 2016-11-25 LAB — CBC
HCT: 21.8 % — ABNORMAL LOW (ref 36.0–46.0)
Hemoglobin: 7.3 g/dL — ABNORMAL LOW (ref 12.0–15.0)
MCH: 30 pg (ref 26.0–34.0)
MCHC: 33.5 g/dL (ref 30.0–36.0)
MCV: 89.7 fL (ref 78.0–100.0)
PLATELETS: 155 10*3/uL (ref 150–400)
RBC: 2.43 MIL/uL — AB (ref 3.87–5.11)
RDW: 12.7 % (ref 11.5–15.5)
WBC: 2.3 10*3/uL — AB (ref 4.0–10.5)

## 2016-11-25 LAB — BASIC METABOLIC PANEL
Anion gap: 12 (ref 5–15)
BUN: 84 mg/dL — AB (ref 6–20)
CALCIUM: 9.2 mg/dL (ref 8.9–10.3)
CO2: 21 mmol/L — ABNORMAL LOW (ref 22–32)
CREATININE: 4.08 mg/dL — AB (ref 0.44–1.00)
Chloride: 101 mmol/L (ref 101–111)
GFR calc Af Amer: 11 mL/min — ABNORMAL LOW (ref >60)
GFR, EST NON AFRICAN AMERICAN: 9 mL/min — AB (ref >60)
GLUCOSE: 202 mg/dL — AB (ref 65–99)
POTASSIUM: 4.3 mmol/L (ref 3.5–5.1)
SODIUM: 134 mmol/L — AB (ref 135–145)

## 2016-11-25 LAB — GLUCOSE, CAPILLARY
GLUCOSE-CAPILLARY: 207 mg/dL — AB (ref 65–99)
GLUCOSE-CAPILLARY: 186 mg/dL — AB (ref 65–99)
GLUCOSE-CAPILLARY: 242 mg/dL — AB (ref 65–99)
Glucose-Capillary: 191 mg/dL — ABNORMAL HIGH (ref 65–99)

## 2016-11-25 LAB — OCCULT BLOOD X 1 CARD TO LAB, STOOL: FECAL OCCULT BLD: POSITIVE — AB

## 2016-11-25 LAB — PREPARE RBC (CROSSMATCH)

## 2016-11-25 LAB — VITAMIN B12: Vitamin B-12: 258 pg/mL (ref 180–914)

## 2016-11-25 MED ORDER — FUROSEMIDE 10 MG/ML IJ SOLN
20.0000 mg | Freq: Once | INTRAMUSCULAR | Status: AC
  Administered 2016-11-25: 20 mg via INTRAVENOUS
  Filled 2016-11-25: qty 2

## 2016-11-25 MED ORDER — VANCOMYCIN HCL IN DEXTROSE 1-5 GM/200ML-% IV SOLN
1000.0000 mg | INTRAVENOUS | Status: DC
  Administered 2016-11-25: 1000 mg via INTRAVENOUS
  Filled 2016-11-25: qty 200

## 2016-11-25 MED ORDER — BISACODYL 10 MG RE SUPP: 10.0000 mg | Freq: Every day | RECTAL | Status: DC | PRN

## 2016-11-25 MED ORDER — TAMSULOSIN HCL 0.4 MG PO CAPS
0.4000 mg | ORAL_CAPSULE | Freq: Every day | ORAL | Status: DC
  Administered 2016-11-25 – 2016-11-27 (×3): 0.4 mg via ORAL
  Filled 2016-11-25 (×3): qty 1

## 2016-11-25 MED ORDER — SORBITOL 70 % SOLN
30.0000 mL | Freq: Once | Status: AC
  Administered 2016-11-25: 30 mL via ORAL
  Filled 2016-11-25: qty 30

## 2016-11-25 MED ORDER — DEXTROSE 5 % IV SOLN
500.0000 mg | INTRAVENOUS | Status: DC
  Administered 2016-11-25 – 2016-11-26 (×2): 500 mg via INTRAVENOUS
  Filled 2016-11-25 (×2): qty 0.5

## 2016-11-25 MED ORDER — SODIUM CHLORIDE 0.9 % IV SOLN: Freq: Once | INTRAVENOUS | Status: DC

## 2016-11-25 NOTE — Progress Notes (Signed)
PROGRESS NOTE    [name]  [id number] DOB: 10/17/1932 DOA: 11/17/2016 PCP: Kenn File, MD    Brief Narrative: 82 year-old female with history of insulin-dependent diabetes mellitus, hypertension, chronic kidney disease stage IV coronary artery disease with LAD balloon angioplasty in 2003 and LAD DS and OM DES in 2008. She was admitted in 2013 for an NSTEMI but declined cardiac cath and was medically managed. Patient was hospitalized about 6 weeks back back with chest pain, was ruled out for ACS but found to have accelerated hypertension. Patient returned to the ED on 12/21 with lower extremity edema or >2 weeks, increase dyspnea on exertion and woke up with chest pain symptoms. She was diaphoretic with EKG showing lateral ST depression and inverted T waves.  Patient was admitted for NSTEMI. She refused cardiac cath and was being managed medically. No further chest pain symptoms. She was also diuresed with IV Lasix for acute on chronic systolic CHF. She however developed acute on chronic kidney disease, diuretics held and based on gentle hydration. Nephrology was consulted. Hospital course also complicated with urinary retention requiring frequent in and out catheterization. Subsequently  Foley was placed.   Hospitalist consulted for low-grade fever with vaginal discharge noted since 12-27  Patient has fever of 100.57F, blood pressure and pulse stable. Patient not very well oriented and appears somnolent and somewhat confused. Upon discussion with the nurse this is new .   Chest x ray with left lower lobe opacity, UA with too numerous to count WBC.   Assessment & Plan:   Principal Problem:   ACS (acute coronary syndrome) ([place]) Active Problems:   Uncontrolled hypertension   CAD S/P PCI '03 and '08   Anemia in chronic kidney disease   Insulin dependent diabetes mellitus with complications (HCC)   Chronic kidney disease, stage IV (severe) (HCC)   Acute on chronic  diastolic CHF (congestive heart failure), NYHA class 4 (HCC)   Pressure injury of skin   UTI; UA with too numerous to count WBC.  Started on cefepime.  Follow urine culture.   PNA;  Start treatment for health care associated PNA with IV vancomycin and cefepime.   Leukopenia;  Repeat labs in am. Further evaluation if persist   ACS (acute coronary syndrome) ([place]) Pt refused cath. Medical management per primary team. On ASA, coreg and statin.  Acute encephalopathy;  Suspect multifactorial. Infection, renal failure.   Acute on chronic Renal failure;  Related to diuresis, obstructive.  Baseline creat = 2.6- 3.1.  Nephrology following.  Cr decreased to 4 from 4.6.  On IV fluids.    Vaginal discharge with low grade fever and  confusion -has curdy vaginal discharge since 12/25. denies any discomfort. Continue with 7 day course of diflucan ( renally dosed).    Acute on chronic diastolic CHF (congestive heart failure), NYHA class 4 (HCC) -patient was diuresis. IV lasix on hold due to worsening renal function.     Uncontrolled hypertension Stable.  Diabetes mellitus type 2  on sliding scale coverage. On lantus at home..  Started on low dose lantus.     Anemia in chronic kidney disease Hb trending down. Will transfuse one unit PRBC.  Will need IV iron.   ? Urinary retention  foley placed on /12/25. Urology consulted planned y primary team treat for infection. Start Flomax.     DVT prophylaxis: SCD Code Status: DNR Family Communication: care discussed with patient.  Disposition Plan: to be determine. Start treatment for PNA, UTI. Will  take patient on our service, cardiology to continue to follow for Heart failure.    Consultants:   Cardiology   Nephrology     Procedures:  ECHO; Ef 65 %    Antimicrobials:   Vancomycin 12-29  Cefepime. 12-29   Subjective: She is feeling weak and tired.   Objective: Vitals:   11/25/16 0803 11/25/16 0833 11/25/16  1029 11/25/16 1117  BP:  (!) 145/52 (!) 121/44   Pulse: 61 65  67  Resp: 14   18  Temp: 98.1 F (36.7 C)   98.3 F (36.8 C)  TempSrc: Oral   Oral  SpO2: 98%   97%  Weight:      Height:        Intake/Output Summary (Last 24 hours) at 11/25/16 1334 Last data filed at 11/25/16 0900  Gross per 24 hour  Intake           1887.5 ml  Output              450 ml  Net           1437.5 ml   Filed Weights   11/22/16 0400 11/23/16 0429 11/24/16 0339  Weight: 61.5 kg (135 lb 9.6 oz) 65 kg (143 lb 6.4 oz) 65.9 kg (145 lb 4.8 oz)    Examination:  General exam: Appears calm and comfortable , sleepy  Respiratory system: Clear to auscultation. Respiratory effort normal. Cardiovascular system: S1 & S2 heard, RRR. No JVD, murmurs, rubs, gallops or clicks. No pedal edema. Gastrointestinal system: Abdomen is nondistended, soft and nontender. No organomegaly or masses felt. Normal bowel sounds heard. Central nervous system: Alert and oriented. No focal neurological deficits. Skin: No rashes, lesions or ulcers Psychiatry: Judgement and insight appear normal. Mood & affect appropriate.     Data Reviewed: I have personally reviewed following labs and imaging studies  CBC:  Recent Labs Lab 11/21/16 0331 11/22/16 0401 11/23/16 0536 11/24/16 0302 11/25/16 0503  WBC 4.2 4.3 4.4 4.2 2.3*  HGB 7.9* 8.2* 8.3* 7.8* 7.3*  HCT 24.5* 25.0* 25.2* 23.4* 21.8*  MCV 92.8 90.9 90.3 89.7 89.7  PLT 141* 148* 166 155 [id number]   Basic Metabolic Panel:  Recent Labs Lab 11/21/16 0331 11/22/16 0822 11/23/16 0536 11/24/16 0302 11/25/16 0743  NA 137 136 135 132* 134*  K 4.2 4.3 4.0 4.4 4.3  CL 97* 95* 94* 95* 101  CO2 30 27 31 26  21*  GLUCOSE 143* 219* 175* 203* 202*  BUN 54* 70* 80* 87* 84*  CREATININE 3.94* 4.45* 4.69* 4.62* 4.08*  CALCIUM 9.6 9.6 9.5 9.0 9.2   GFR: Estimated Creatinine Clearance: 9.9 mL/min (by C-G formula based on SCr of 4.08 mg/dL (H)). Liver Function Tests: No results for  input(s): AST, ALT, ALKPHOS, BILITOT, PROT, ALBUMIN in the last 168 hours.  Recent Labs Lab 11/18/16 1613  LIPASE 16  AMYLASE 25*   No results for input(s): AMMONIA in the last 168 hours. Coagulation Profile: No results for input(s): INR, PROTIME in the last 168 hours. Cardiac Enzymes: No results for input(s): CKTOTAL, CKMB, CKMBINDEX, TROPONINI in the last 168 hours. BNP (last 3 results) No results for input(s): PROBNP in the last 8760 hours. HbA1C: No results for input(s): HGBA1C in the last 72 hours. CBG:  Recent Labs Lab 11/24/16 1148 11/24/16 1636 11/24/16 2053 11/25/16 0730 11/25/16 1105  GLUCAP 210* 163* 119* 191* 207*   Lipid Profile: No results for input(s): CHOL, HDL, LDLCALC, TRIG, CHOLHDL, LDLDIRECT in the last 72  hours. Thyroid Function Tests: No results for input(s): TSH, T4TOTAL, FREET4, T3FREE, THYROIDAB in the last 72 hours. Anemia Panel:  Recent Labs  11/25/16 0743  VITAMINB12 258  TIBC 165*  IRON 17*   Sepsis Labs: No results for input(s): PROCALCITON, LATICACIDVEN in the last 168 hours.  Recent Results (from the past 240 hour(s))  MRSA PCR Screening     Status: None   Collection Time: 11/17/16  6:11 PM  Result Value Ref Range Status   MRSA by PCR NEGATIVE NEGATIVE Final    Comment:        The GeneXpert MRSA Assay (FDA approved for NASAL specimens only), is one component of a comprehensive MRSA colonization surveillance program. It is not intended to diagnose MRSA infection nor to guide or monitor treatment for MRSA infections.   Urine culture     Status: Abnormal   Collection Time: 11/18/16  5:35 PM  Result Value Ref Range Status   Specimen Description URINE, CATHETERIZED  Final   Special Requests NONE  Final   Culture MULTIPLE SPECIES PRESENT, SUGGEST RECOLLECTION (A)  Final   Report Status 11/19/2016 FINAL  Final  Culture, Urine     Status: None   Collection Time: 11/20/16  1:28 PM  Result Value Ref Range Status   Specimen  Description URINE, CATHETERIZED  Final   Special Requests Normal  Final   Culture NO GROWTH  Final   Report Status 11/21/2016 FINAL  Final         Radiology Studies: Dg Chest 2 View  Result Date: 11/24/2016 CLINICAL DATA:  Pt said she has been running a low grade fever for the past two days. She said she does have a productive cough with yellowish phlegm. Hx of CAD, CHF, HTN, IDDM EXAM: CHEST  2 VIEW COMPARISON:  11/17/2016 FINDINGS: Cardiac silhouette is mildly enlarged. No mediastinal or hilar masses. No convincing adenopathy. Left lung base opacity noted, mildly increased from the prior study. Although this may be the atelectasis, pneumonia is suspected given the patient's history. Remainder of the lungs is clear. Possible minimal left pleural effusion. No pneumothorax. Skeletal structures are demineralized but grossly intact. IMPRESSION: 1. Left lower lobe opacity suspicious for pneumonia. Electronically Signed   By: Lajean Manes M.D.   On: 11/24/2016 18:55        Scheduled Meds: . amLODipine  10 mg Oral Daily  . aspirin EC  81 mg Oral Daily  . carvedilol  6.25 mg Oral BID WC  . ceFEPime (MAXIPIME) IV  500 mg Intravenous Q24H  . cholecalciferol  1,000 Units Oral Daily  . cloNIDine  0.2 mg Oral TID  . fluconazole  50 mg Oral Daily  . hydrALAZINE  25 mg Oral Q8H  . insulin aspart  0-15 Units Subcutaneous TID WC  . insulin glargine  5 Units Subcutaneous QHS  . isosorbide mononitrate  30 mg Oral Daily  . levothyroxine  50 mcg Oral QAC breakfast  . mouth rinse  15 mL Mouth Rinse BID  . pantoprazole  40 mg Oral BID  . rosuvastatin  10 mg Oral Daily  . vancomycin  1,000 mg Intravenous Q48H   Continuous Infusions: . sodium chloride 65 mL/hr at 11/25/16 0530     LOS: 8 days    Time spent: 35 minutes,.     Elmarie Shiley, MD [facility] Pager [phone]  If 7PM-7AM, please contact night-coverage [email or web address] Password [email or web address] 11/25/2016, 1:34 PM

## 2016-11-25 NOTE — Progress Notes (Addendum)
Patient Name: [name] Date of Encounter: 11/25/2016  Primary Cardiologist:   [facility] Problem List     Principal Problem:   ACS (acute coronary syndrome) [facility]) Active Problems:   Uncontrolled hypertension   CAD S/P PCI '03 and '08   Anemia in chronic kidney disease   Insulin dependent diabetes mellitus with complications (HCC)   Chronic kidney disease, stage IV (severe) (HCC)   Acute on chronic diastolic CHF (congestive heart failure), NYHA class 4 (HCC)   Pressure injury of skin     Subjective   She denies pain or SOB.    Still requiring straight cath to urinate.  Developed low grade fever yesterday with foul smelling vaginal discharge.  IM consulted and started on Diflucan.  Now with Temp up to 100.6 last night but afebrile this am.  Inpatient Medications    Scheduled Meds: . amLODipine  10 mg Oral Daily  . aspirin EC  81 mg Oral Daily  . carvedilol  6.25 mg Oral BID WC  . cholecalciferol  1,000 Units Oral Daily  . cloNIDine  0.2 mg Oral TID  . fluconazole  50 mg Oral Daily  . hydrALAZINE  25 mg Oral Q8H  . insulin aspart  0-15 Units Subcutaneous TID WC  . insulin glargine  5 Units Subcutaneous QHS  . isosorbide mononitrate  30 mg Oral Daily  . levothyroxine  50 mcg Oral QAC breakfast  . mouth rinse  15 mL Mouth Rinse BID  . pantoprazole  40 mg Oral BID  . rosuvastatin  10 mg Oral Daily   Continuous Infusions: . sodium chloride 65 mL/hr at 11/25/16 0530   PRN Meds: acetaminophen, ALPRAZolam, gi cocktail, magnesium hydroxide, nitroGLYCERIN, ondansetron (ZOFRAN) IV, zolpidem   Vital Signs    Vitals:   11/24/16 2022 11/25/16 0006 11/25/16 0520 11/25/16 0620  BP: (!) 119/44 (!) 130/47 (!) 145/47 (!) 145/50  Pulse: (!) 59 (!) 56 (!) 58   Resp: 15     Temp: 98.9 F (37.2 C) 98.4 F (36.9 C) 98.6 F (37 C)   TempSrc: Oral Oral Oral   SpO2: 98% 97% 97%   Weight:      Height:        Intake/Output Summary (Last 24 hours) at 11/25/16  0719 Last data filed at 11/25/16 0530  Gross per 24 hour  Intake           1767.5 ml  Output              570 ml  Net           1197.5 ml   Filed Weights   11/22/16 0400 11/23/16 0429 11/24/16 0339  Weight: 135 lb 9.6 oz (61.5 kg) 143 lb 6.4 oz (65 kg) 145 lb 4.8 oz (65.9 kg)    Physical Exam    GEN: NAD.  Neck:  No JVD Cardiac: Regular Rate and Rhythm, no murmurs, rubs, or gallops.  Mild right greater than left edema.  Radials/DP/PT 2+  and equal bilaterally.  Respiratory:  Respirations  regular and unlabored, clear to auscultation bilaterally. GI: Soft, nontender, nondistended, BS + x 4. Skin: warm and dry, no rash. Neuro:  Non focal.   Generally weak .  Psych:  AAOx3.  Normal affect.  Labs    CBC  Recent Labs  11/24/16 0302 11/25/16 0503  WBC 4.2 2.3*  HGB 7.8* 7.3*  HCT 23.4* 21.8*  MCV 89.7 89.7  PLT 155 [id number]   Basic Metabolic  Panel  Recent Labs  11/23/16 0536 11/24/16 0302  NA 135 132*  K 4.0 4.4  CL 94* 95*  CO2 31 26  GLUCOSE 175* 203*  BUN 80* 87*  CREATININE 4.69* 4.62*  CALCIUM 9.5 9.0   Liver Function Tests No results for input(s): AST, ALT, ALKPHOS, BILITOT, PROT, ALBUMIN in the last 72 hours. No results for input(s): LIPASE, AMYLASE in the last 72 hours. Cardiac Enzymes No results for input(s): CKTOTAL, CKMB, CKMBINDEX, TROPONINI in the last 72 hours. BNP Invalid input(s): POCBNP D-Dimer No results for input(s): DDIMER in the last 72 hours. Hemoglobin A1C No results for input(s): HGBA1C in the last 72 hours. Fasting Lipid Panel No results for input(s): CHOL, HDL, LDLCALC, TRIG, CHOLHDL, LDLDIRECT in the last 72 hours. Thyroid Function Tests No results for input(s): TSH, T4TOTAL, T3FREE, THYROIDAB in the last 72 hours.  Invalid input(s): FREET3  Telemetry    NSR - Personally Reviewed  ECG     - Personally Reviewed  Radiology    Dg Chest 2 View  Result Date: 11/24/2016 CLINICAL DATA:  Pt said she has been running a low  grade fever for the past two days. She said she does have a productive cough with yellowish phlegm. Hx of CAD, CHF, HTN, IDDM EXAM: CHEST  2 VIEW COMPARISON:  11/17/2016 FINDINGS: Cardiac silhouette is mildly enlarged. No mediastinal or hilar masses. No convincing adenopathy. Left lung base opacity noted, mildly increased from the prior study. Although this may be the atelectasis, pneumonia is suspected given the patient's history. Remainder of the lungs is clear. Possible minimal left pleural effusion. No pneumothorax. Skeletal structures are demineralized but grossly intact. IMPRESSION: 1. Left lower lobe opacity suspicious for pneumonia. Electronically Signed   By: Lajean Manes M.D.   On: 11/24/2016 18:55    Cardiac Studies   Renal ultrasound:  Mild right-sided hydronephrosis.  Three parapelvic left renal cysts with the largest measuring 2.8 cm over the lower pole.  Patient Profile     82 y.o.year old femalewith a history of DM, HTN, and CKD IV,LAD POBA in '03 and an LAD DES and OM DES in '08. Recently admitted 11/08-11/10/17,w/ chest pain, SBP >200. Ez neg, CP resolved w/ IV NTG>>improved BP control. BB decreased 2nd HR 40s, clonidine patch added. 11/08/16, pt seen with Diast CHF wt 150 lbs, +LE edema, Lasix changed to torsemide. Adm 11/17/16 with CP, SOB.   Assessment & Plan    CAD:  Pt admitted with NSTEMI.   The patient did not want a cardiac cath.  Medical management on ASA and beta blocker.   No further CP   HTN:  BP fairly well controlled on current meds.  Continue amlodipine/BB/Clonidine/Hydralazine  ACUTE ON CHRONIC DIASTOLIC HF:   Creatinine has continued to increase after diuresis but appears to have plateud yesterday. BMET pending this am.   Nephrology is on board.  No evidence of volume overload on exam.  Continue BB/hydralazine and long acting nitrates.  Diuretics on hold to to acute on CKD.   CHRONIC KIDNEY DISEASE STAGE IV:   Nephrology consulted.  Creat appears to  have platued.  Rise in creatinine likely due to over diuresis. due to vol depletion after diuresis. Creat stabilized mid 4's now. Baseline creat = 2.6- 3.1. Per nephrology, 2ould continue IVF another 24 hrs then stop. Cont to hold demadex but if dc'd home should be restarted at DC.  She sees Dr Hinda Lenis in [place] for CKD and should f/u with him after dc.  No dialysis (pt request). DNR.  URINARY RETENTION:    The patient has required in an out cath secondary to urinary retenion.  Urology consult pending.   PAF:   Maintaining NSR.  Not an anticoagulation candidate at this point.   Vaginal discharge - appreciate IM consult.  UA with large WBC, leukocyte + and many bacteria.  Urine cx pending.  WBC low today at 2.3.  Check blood cultures due to low grade fever.  Continue Diflucan.  ? Whether we should add coverage for GNR UTI as well given many bacteria on UA.  Will leave up to IM.   FEVER with UTI and chest xray suspicious for PNA in [facility]LL.  Per IM will need to add antibx coverage.  Will leave decision for them   ANEMIA ? Etiology - probably anemia of chronic disease but concerned that Hbg is trending downward along with WBC.  Check iron panel, B12, folate and ferritin.  Heme check stools.  Await further recs from IM.   Mild AS and mild to moderate MS on echo.   Placement:   Will get PT/OT consult for assistance in determining where she should go at discharge.   I am going to ask [facility] to assume care as her main problems now are noncardiac.    Fransico Him, MD  11/25/2016 7:19 AM    [address],  [place] [place], [place]  [address] Pager [phone] Phone: [phone]; Fax: [phone]

## 2016-11-25 NOTE — Progress Notes (Signed)
Physical Therapy Treatment Patient Details Name: [name] MRN: [id number] DOB: 25-Sep-1932 Today's Date: 11/25/2016    History of Present Illness 82 yo admitted with angina and NSTEMI. PMHx: CAD, DM, HTN, CKD, CHF, CVA, right knee surgery, NSTEMI    PT Comments    Pt able to increase her mobility with decreased assistance needed for bed mobility and transfers and increased ambulation (50 ft with rw). Pt does continue to have limited endurance during session. Recommending SNF with 24 hour supervision based upon the patient's current mobility level. Pt reports that she is hoping to D/C to her daughter's home who works during the day. When questioned if she would have help when her daughter is working the pt reported that she would be fine and that they would make is work. PT to continue to follow and attempt to progress mobility and safety. Son and daughter-in-law present during session.   Follow Up Recommendations  SNF;Supervision/Assistance - 24 hour     Equipment Recommendations  None recommended by PT    Recommendations for Other Services       Precautions / Restrictions Precautions Precautions: Fall Restrictions Weight Bearing Restrictions: No    Mobility  Bed Mobility Overal bed mobility: Needs Assistance Bed Mobility: Supine to Sit     Supine to sit: Min guard     General bed mobility comments: HOB elevated, using rails to assist.   Transfers Overall transfer level: Needs assistance Equipment used: Rolling walker (2 wheeled) Transfers: Sit to/from Stand Sit to Stand: Mod assist         General transfer comment: Repeat X2, X1bed, X1 chair. Cues needed for hand placement standing and sitting.   Ambulation/Gait Ambulation/Gait assistance: Min guard Ambulation Distance (Feet): 50 Feet Assistive device: Rolling walker (2 wheeled) Gait Pattern/deviations: Decreased step length - right;Decreased step length - left;Trunk flexed;Step-through pattern Gait  velocity: decreased   General Gait Details: cues for posture, SpO2 above 90% throughout session.    Stairs            Wheelchair Mobility    Modified Rankin (Stroke Patients Only)       Balance Overall balance assessment: Needs assistance Sitting-balance support: No upper extremity supported Sitting balance-Leahy Scale: Fair     Standing balance support: Bilateral upper extremity supported Standing balance-Leahy Scale: Poor Standing balance comment: using rw                    Cognition Arousal/Alertness: Awake/alert Behavior During Therapy: WFL for tasks assessed/performed Overall Cognitive Status: Within Functional Limits for tasks assessed                 General Comments: appears to have some difficlty with safety awareness    Exercises      General Comments        Pertinent Vitals/Pain Pain Assessment: No/denies pain    Home Living                      Prior Function            PT Goals (current goals can now be found in the care plan section) Acute Rehab PT Goals Patient Stated Goal: go home PT Goal Formulation: With patient Time For Goal Achievement: 12/03/16 Potential to Achieve Goals: Fair Progress towards PT goals: Progressing toward goals    Frequency    Min 3X/week      PT Plan Current plan remains appropriate    Co-evaluation  End of Session Equipment Utilized During Treatment: Gait belt Activity Tolerance: Patient limited by fatigue Patient left: in chair;with call bell/phone within reach;with chair alarm set;with family/visitor present     Time: [id number] PT Time Calculation (min) (ACUTE ONLY): 28 min  Charges:  $Gait Training: 8-22 mins $Therapeutic Activity: 8-22 mins                    G Codes:      Cassell Clement, PT, CSCS Pager [phone] Office [phone]  11/25/2016, 4:11 PM

## 2016-11-25 NOTE — Care Management Note (Addendum)
Case Management Note Original Note Created by; Marvetta Gibbons RN, BSN Unit 2W-Case Manager [phone] Covering 3W  Patient Details  Name: CELINA ATHERLEY MRN: [id number] Date of Birth: 1932/01/22  Subjective/Objective: Pt admitted with ACS,                    Action/Plan: PTA pt lived at home alone, has daughter nearby that assist. Per conversation with pt and daughter at bedside- plan is for pt to stay with daughter for a short while. Pt and daughter state that pt does not have any difficulty getting meds or affording copays- uses [employer]- daughter is trying to find out if insurance will cover her being pt's aide- discussed this option with daughter- daughter to f/u with insurance. Pt does not have Medicaid. Per daughter they would like HH and have used [facility] in the past - would benefit from HHRN/PT/aide- MD please place orders if agree. Pt also needs 3n1 for home- will need DME order for 3n1.  [facility] consult placed  Expected Discharge Date:                  Expected Discharge Plan:  [place]  In-House Referral:     Discharge planning Services  CM Consult  Post Acute Care Choice:  Home Health Choice offered to:  Patient, Adult Children  DME Arranged:    DME Agency:  [facility]:    [place] Agency:  [place]  Status of Service:  In process, will continue to follow  If discussed at Long Length of Stay Meetings, dates discussed:    Additional Comments: 11/25/2016 Elenor Quinones, RN, BSN [phone] Pt refusing SNF as recommended and plans to discharge directly to daughters home. CM requested [place] and DME orders via bedside nurse and physician sticky tab  - services have not been arranged.   Pt started on IV cefepime and vanc today due to newly dx HCAP - discharge anticipated early next week   Criag Wicklund S, RN 11/25/2016, 2:42 PM

## 2016-11-25 NOTE — Progress Notes (Signed)
Pharmacy Antibiotic Note  [name] is a 82 y.o. female admitted on 11/17/2016 with NSTEMI, now w/ concern for HCAP.  Pharmacy has been consulted for vancomycin and cefepime dosing.  Pt in acute on CKD4, baseline SCr ~2.6-3.1, now 4.6, pt refuses HD.  Plan: Vancomycin 1000mg  IV every 48 hours.  Goal trough 15-20 mcg/mL.  Cefepime 500mg  IV every 24 hours.  Height: 5\' 4"  (162.6 cm) Weight: 145 lb 4.8 oz (65.9 kg) IBW/kg (Calculated) : 54.7  Temp (24hrs), Avg:99 F (37.2 C), Min:98 F (36.7 C), Max:100.6 F (38.1 C)   Recent Labs Lab 11/20/16 0328 11/21/16 0331 11/22/16 0401 11/22/16 0822 11/23/16 0536 11/24/16 0302 11/25/16 0503  WBC 5.6 4.2 4.3  --  4.4 4.2 2.3*  CREATININE 3.30* 3.94*  --  4.45* 4.69* 4.62*  --     Estimated Creatinine Clearance: 8.8 mL/min (by C-G formula based on SCr of 4.62 mg/dL (H)).    Allergies  Allergen Reactions  . Propoxyphene N-Acetaminophen     Pt does not know reaction  . Simvastatin Other (See Comments)    headache     Thank you for allowing pharmacy to be a part of this patient's care.  Wynona Neat, PharmD, BCPS  11/25/2016 7:27 AM

## 2016-11-25 NOTE — Care Management Important Message (Signed)
Important Message  Patient Details  Name: [name] MRN: [id number] Date of Birth: Sep 07, 1932   Medicare Important Message Given:  Yes    Lashonne Shull Abena 11/25/2016, 1:59 PM

## 2016-11-26 DIAGNOSIS — Z794 Long term (current) use of insulin: Secondary | ICD-10-CM

## 2016-11-26 DIAGNOSIS — E118 Type 2 diabetes mellitus with unspecified complications: Secondary | ICD-10-CM

## 2016-11-26 LAB — CBC
HCT: 26.8 % — ABNORMAL LOW (ref 36.0–46.0)
HEMOGLOBIN: 9.1 g/dL — AB (ref 12.0–15.0)
MCH: 29.8 pg (ref 26.0–34.0)
MCHC: 34 g/dL (ref 30.0–36.0)
MCV: 87.9 fL (ref 78.0–100.0)
PLATELETS: 160 10*3/uL (ref 150–400)
RBC: 3.05 MIL/uL — ABNORMAL LOW (ref 3.87–5.11)
RDW: 13.9 % (ref 11.5–15.5)
WBC: 4.1 10*3/uL (ref 4.0–10.5)

## 2016-11-26 LAB — TYPE AND SCREEN
ABO/RH(D): A POS
ANTIBODY SCREEN: POSITIVE
DAT, IgG: NEGATIVE
Unit division: 0

## 2016-11-26 LAB — GLUCOSE, CAPILLARY
GLUCOSE-CAPILLARY: 218 mg/dL — AB (ref 65–99)
Glucose-Capillary: 199 mg/dL — ABNORMAL HIGH (ref 65–99)
Glucose-Capillary: 196 mg/dL — ABNORMAL HIGH (ref 65–99)
Glucose-Capillary: 161 mg/dL — ABNORMAL HIGH (ref 65–99)

## 2016-11-26 LAB — BASIC METABOLIC PANEL
Anion gap: 11 (ref 5–15)
BUN: 85 mg/dL — AB (ref 6–20)
CALCIUM: 9.3 mg/dL (ref 8.9–10.3)
CO2: 23 mmol/L (ref 22–32)
Chloride: 99 mmol/L — ABNORMAL LOW (ref 101–111)
Creatinine, Ser: 3.75 mg/dL — ABNORMAL HIGH (ref 0.44–1.00)
GFR calc Af Amer: 12 mL/min — ABNORMAL LOW (ref >60)
GFR, EST NON AFRICAN AMERICAN: 10 mL/min — AB (ref >60)
GLUCOSE: 257 mg/dL — AB (ref 65–99)
Potassium: 4.5 mmol/L (ref 3.5–5.1)
Sodium: 133 mmol/L — ABNORMAL LOW (ref 135–145)

## 2016-11-26 LAB — FERRITIN: FERRITIN: 887 ng/mL — AB (ref 11–307)

## 2016-11-26 LAB — URINE CULTURE

## 2016-11-26 MED ORDER — LEVOFLOXACIN 500 MG PO TABS
500.0000 mg | ORAL_TABLET | ORAL | Status: DC
  Administered 2016-11-27: 500 mg via ORAL
  Filled 2016-11-26: qty 1

## 2016-11-26 MED ORDER — INSULIN GLARGINE 100 UNIT/ML ~~LOC~~ SOLN
10.0000 [IU] | Freq: Every day | SUBCUTANEOUS | Status: DC
  Administered 2016-11-26: 10 [IU] via SUBCUTANEOUS
  Filled 2016-11-26: qty 0.1

## 2016-11-26 MED ORDER — CYANOCOBALAMIN 1000 MCG/ML IJ SOLN
1000.0000 ug | Freq: Every day | INTRAMUSCULAR | Status: AC
  Administered 2016-11-26 – 2016-11-28 (×3): 1000 ug via SUBCUTANEOUS
  Filled 2016-11-26 (×3): qty 1

## 2016-11-26 NOTE — Progress Notes (Signed)
- PROGRESS NOTE    [name]  [id number] DOB: February 28, 1932 DOA: 11/17/2016 PCP: Kenn File, MD    Brief Narrative: 82 year-old female with history of insulin-dependent diabetes mellitus, hypertension, chronic kidney disease stage IV coronary artery disease with LAD balloon angioplasty in 2003 and LAD DS and OM DES in 2008. She was admitted in 2013 for an NSTEMI but declined cardiac cath and was medically managed. Patient was hospitalized about 6 weeks back back with chest pain, was ruled out for ACS but found to have accelerated hypertension. Patient returned to the ED on 12/21 with lower extremity edema or >2 weeks, increase dyspnea on exertion and woke up with chest pain symptoms. She was diaphoretic with EKG showing lateral ST depression and inverted T waves.  Patient was admitted for NSTEMI. She refused cardiac cath and was being managed medically.  She was also diuresed with IV Lasix for acute on chronic dCHF. She however developed acute on chronic kidney disease, diuretics held and started on gentle hydration. Nephrology was consulted. Hospital course also complicated with urinary retention requiring frequent in and out catheterization. Subsequently foley was placed.  Hospitalist consulted on 12/27 for low-grade fever with vaginal discharge. Found to have + UA and pneumonia and started on antibiotics, Vanc and Cefepime, for HCAP and UTI. Patient transferred to Hospitalist service on 12/29.    Assessment & Plan:   ACS (acute coronary syndrome) ([place]) Pt refused cath. Medical management per cardiology-  On ASA, coreg and statin.   UTI; UA with too numerous to count WBC.  Started on cefepime   PNA;  - cough with yellow sputum for 2-3 days- crackles in LLL and LLL infiltrate on CXR Started treatment for health care associated PNA with IV vancomycin and cefepime - d/c Vanc today- change Cefepime to Levaquin to which above UTI is also sensitive  Leukopenia;  - WBC 2.3 on  12/29 only- ? Erroneous result -  now at her baseline-  Acute encephalopathy;  Suspect multifactorial. Infection, renal failure - appears oriented today   Acute on chronic Renal failure;  Related to diuresis and likely obstructive.  Baseline creat = 2.6- 3.1.  Nephrology feels it is due to volume depletion and has signed off - on slow IVF - follows with Dr Hinda Lenis in [place]   Acute on chronic diastolic CHF (congestive heart failure), NYHA class 4   - has normal EF but high ventricular filling pressures and mod LVH -patient was started on diuretics which are on hold due to worsening renal function.   ? U retention - foley placed on 12/25- will give voiding trial prior to discharge- started on Flomax   Vaginal discharge - candida -has curdy vaginal discharge since 12/25. Continue diflucan ( renally dosed).     Uncontrolled hypertension Stable.  Diabetes mellitus type 2 - sliding scale coverage & Lantus- increase dose of lantus today due to elevated sugars    Anemia in chronic kidney disease - Hb trending down- transfused one unit PRBC- Hb 7.3 >> 9.1- no acute bleeding - ferretin high (has been normal to high in the past) - check folate -  B12 258- will give s/c B12 - FOB pending   DVT prophylaxis: SCD Code Status: DNR Family Communication: care discussed with patient.  Disposition Plan:  SNF  Consultants:   Cardiology   Nephrology     Procedures:  ECHO; Ef 65 %    Antimicrobials:   Vancomycin 12-29- 12/30  Cefepime. 12-29- 12/30  - Levaquin  12/30   Subjective: Cough is improving- no new complaints.   Objective: Vitals:   11/26/16 0529 11/26/16 0734 11/26/16 1350 11/26/16 1618  BP: 138/66  (!) 132/46   Pulse: (!) 58     Resp:      Temp: 98.1 F (36.7 C) 98.5 F (36.9 C)  98.1 F (36.7 C)  TempSrc: Oral Oral  Oral  SpO2: 100% 98%    Weight: 68.3 kg (150 lb 9.6 oz)     Height:        Intake/Output Summary (Last 24 hours) at 11/26/16  1625 Last data filed at 11/26/16 0600  Gross per 24 hour  Intake           3102.5 ml  Output              350 ml  Net           2752.5 ml   Filed Weights   11/23/16 0429 11/24/16 0339 11/26/16 0529  Weight: 65 kg (143 lb 6.4 oz) 65.9 kg (145 lb 4.8 oz) 68.3 kg (150 lb 9.6 oz)    Examination:  General exam: Appears calm and comfortable , sleepy  Respiratory system: Clear to auscultation. Respiratory effort normal. Cardiovascular system: S1 & S2 heard, RRR. No JVD, murmurs, rubs, gallops or clicks. No pedal edema. Gastrointestinal system: Abdomen is nondistended, soft and nontender. No organomegaly or masses felt. Normal bowel sounds heard. Central nervous system: Alert and oriented. No focal neurological deficits. Skin: No rashes, lesions or ulcers Psychiatry: Judgement and insight appear normal. Mood & affect appropriate.     Data Reviewed: I have personally reviewed following labs and imaging studies  CBC:  Recent Labs Lab 11/22/16 0401 11/23/16 0536 11/24/16 0302 11/25/16 0503 11/26/16 0418  WBC 4.3 4.4 4.2 2.3* 4.1  HGB 8.2* 8.3* 7.8* 7.3* 9.1*  HCT 25.0* 25.2* 23.4* 21.8* 26.8*  MCV 90.9 90.3 89.7 89.7 87.9  PLT 148* 166 155 155 0000000   Basic Metabolic Panel:  Recent Labs Lab 11/22/16 0822 11/23/16 0536 11/24/16 0302 11/25/16 0743 11/26/16 0427  NA 136 135 132* 134* 133*  K 4.3 4.0 4.4 4.3 4.5  CL 95* 94* 95* 101 99*  CO2 27 31 26  21* 23  GLUCOSE 219* 175* 203* 202* 257*  BUN 70* 80* 87* 84* 85*  CREATININE 4.45* 4.69* 4.62* 4.08* 3.75*  CALCIUM 9.6 9.5 9.0 9.2 9.3   GFR: Estimated Creatinine Clearance: 11 mL/min (by C-G formula based on SCr of 3.75 mg/dL (H)). Liver Function Tests: No results for input(s): AST, ALT, ALKPHOS, BILITOT, PROT, ALBUMIN in the last 168 hours. No results for input(s): LIPASE, AMYLASE in the last 168 hours. No results for input(s): AMMONIA in the last 168 hours. Coagulation Profile: No results for input(s): INR, PROTIME in  the last 168 hours. Cardiac Enzymes: No results for input(s): CKTOTAL, CKMB, CKMBINDEX, TROPONINI in the last 168 hours. BNP (last 3 results) No results for input(s): PROBNP in the last 8760 hours. HbA1C: No results for input(s): HGBA1C in the last 72 hours. CBG:  Recent Labs Lab 11/25/16 1614 11/25/16 2106 11/26/16 0733 11/26/16 1107 11/26/16 1617  GLUCAP 186* 242* 218* 199* 196*   Lipid Profile: No results for input(s): CHOL, HDL, LDLCALC, TRIG, CHOLHDL, LDLDIRECT in the last 72 hours. Thyroid Function Tests: No results for input(s): TSH, T4TOTAL, FREET4, T3FREE, THYROIDAB in the last 72 hours. Anemia Panel:  Recent Labs  11/25/16 0743 11/26/16 0418  VITAMINB12 258  --   FERRITIN  --  887*  TIBC 165*  --   IRON 17*  --    Sepsis Labs: No results for input(s): PROCALCITON, LATICACIDVEN in the last 168 hours.  Recent Results (from the past 240 hour(s))  MRSA PCR Screening     Status: None   Collection Time: 11/17/16  6:11 PM  Result Value Ref Range Status   MRSA by PCR NEGATIVE NEGATIVE Final    Comment:        The GeneXpert MRSA Assay (FDA approved for NASAL specimens only), is one component of a comprehensive MRSA colonization surveillance program. It is not intended to diagnose MRSA infection nor to guide or monitor treatment for MRSA infections.   Urine culture     Status: Abnormal   Collection Time: 11/18/16  5:35 PM  Result Value Ref Range Status   Specimen Description URINE, CATHETERIZED  Final   Special Requests NONE  Final   Culture MULTIPLE SPECIES PRESENT, SUGGEST RECOLLECTION (A)  Final   Report Status 11/19/2016 FINAL  Final  Culture, Urine     Status: None   Collection Time: 11/20/16  1:28 PM  Result Value Ref Range Status   Specimen Description URINE, CATHETERIZED  Final   Special Requests Normal  Final   Culture NO GROWTH  Final   Report Status 11/21/2016 FINAL  Final  Culture, Urine     Status: Abnormal   Collection Time: 11/24/16   6:03 PM  Result Value Ref Range Status   Specimen Description URINE, CATHETERIZED  Final   Special Requests NONE  Final   Culture >=100,000 COLONIES/mL KLEBSIELLA PNEUMONIAE (A)  Final   Report Status 11/26/2016 FINAL  Final   Organism ID, Bacteria KLEBSIELLA PNEUMONIAE (A)  Final      Susceptibility   Klebsiella pneumoniae - MIC*    AMPICILLIN 16 RESISTANT Resistant     CEFAZOLIN <=4 SENSITIVE Sensitive     CEFTRIAXONE <=1 SENSITIVE Sensitive     CIPROFLOXACIN <=0.25 SENSITIVE Sensitive     GENTAMICIN <=1 SENSITIVE Sensitive     IMIPENEM <=0.25 SENSITIVE Sensitive     NITROFURANTOIN 32 SENSITIVE Sensitive     TRIMETH/SULFA <=20 SENSITIVE Sensitive     AMPICILLIN/SULBACTAM <=2 SENSITIVE Sensitive     PIP/TAZO <=4 SENSITIVE Sensitive     Extended ESBL NEGATIVE Sensitive     * >=100,000 COLONIES/mL KLEBSIELLA PNEUMONIAE  Culture, blood (Routine X 2) w Reflex to ID Panel     Status: None (Preliminary result)   Collection Time: 11/25/16  7:45 AM  Result Value Ref Range Status   Specimen Description BLOOD LEFT HAND  Final   Special Requests BOTTLES DRAWN AEROBIC AND ANAEROBIC 5CC EA  Final   Culture NO GROWTH 1 DAY  Final   Report Status PENDING  Incomplete  Culture, blood (Routine X 2) w Reflex to ID Panel     Status: None (Preliminary result)   Collection Time: 11/25/16  7:55 AM  Result Value Ref Range Status   Specimen Description BLOOD RIGHT HAND  Final   Special Requests BOTTLES DRAWN AEROBIC AND ANAEROBIC 5CCEA  Final   Culture NO GROWTH 1 DAY  Final   Report Status PENDING  Incomplete         Radiology Studies: Dg Chest 2 View  Result Date: 11/24/2016 CLINICAL DATA:  Pt said she has been running a low grade fever for the past two days. She said she does have a productive cough with yellowish phlegm. Hx of CAD, CHF, HTN, IDDM EXAM: CHEST  2  VIEW COMPARISON:  11/17/2016 FINDINGS: Cardiac silhouette is mildly enlarged. No mediastinal or hilar masses. No convincing  adenopathy. Left lung base opacity noted, mildly increased from the prior study. Although this may be the atelectasis, pneumonia is suspected given the patient's history. Remainder of the lungs is clear. Possible minimal left pleural effusion. No pneumothorax. Skeletal structures are demineralized but grossly intact. IMPRESSION: 1. Left lower lobe opacity suspicious for pneumonia. Electronically Signed   By: Lajean Manes M.D.   On: 11/24/2016 18:55        Scheduled Meds: . sodium chloride   Intravenous Once  . amLODipine  10 mg Oral Daily  . aspirin EC  81 mg Oral Daily  . carvedilol  6.25 mg Oral BID WC  . ceFEPime (MAXIPIME) IV  500 mg Intravenous Q24H  . cholecalciferol  1,000 Units Oral Daily  . cloNIDine  0.2 mg Oral TID  . fluconazole  50 mg Oral Daily  . hydrALAZINE  25 mg Oral Q8H  . insulin aspart  0-15 Units Subcutaneous TID WC  . insulin glargine  5 Units Subcutaneous QHS  . isosorbide mononitrate  30 mg Oral Daily  . levothyroxine  50 mcg Oral QAC breakfast  . mouth rinse  15 mL Mouth Rinse BID  . pantoprazole  40 mg Oral BID  . rosuvastatin  10 mg Oral Daily  . tamsulosin  0.4 mg Oral QPC supper  . vancomycin  1,000 mg Intravenous Q48H   Continuous Infusions: . sodium chloride 65 mL/hr at 11/25/16 0530     LOS: 9 days    Time spent: 35 minutes,.     Debbe Odea, MD [facility] Pager: [email or web address] Password [email or web address] 11/26/2016, 4:25 PM

## 2016-11-26 NOTE — Progress Notes (Signed)
Pharmacy Antibiotic Note  YAQUELYN CALLICUTT is a 82 y.o. female admitted on 11/17/2016 with pneumonia and UTI.  Pharmacy has been consulted for levaquin dosing.  Plan: Levaquin 500mg  PO q48h Monitor culture data, renal function and clinical course  Height: 5\' 4"  (162.6 cm) Weight: 150 lb 9.6 oz (68.3 kg) IBW/kg (Calculated) : 54.7  Temp (24hrs), Avg:98 F (36.7 C), Min:97.5 F (36.4 C), Max:98.5 F (36.9 C)   Recent Labs Lab 11/22/16 0401 11/22/16 0822 11/23/16 0536 11/24/16 0302 11/25/16 0503 11/25/16 0743 11/26/16 0418 11/26/16 0427  WBC 4.3  --  4.4 4.2 2.3*  --  4.1  --   CREATININE  --  4.45* 4.69* 4.62*  --  4.08*  --  3.75*    Estimated Creatinine Clearance: 11 mL/min (by C-G formula based on SCr of 3.75 mg/dL (H)).    Allergies  Allergen Reactions  . Propoxyphene N-Acetaminophen     Pt does not know reaction  . Simvastatin Other (See Comments)    headache    Andrey Cota. Diona Foley, PharmD, BCPS Clinical Pharmacist Pager [phone] 11/26/2016 4:31 PM

## 2016-11-27 DIAGNOSIS — I4891 Unspecified atrial fibrillation: Secondary | ICD-10-CM

## 2016-11-27 DIAGNOSIS — J181 Lobar pneumonia, unspecified organism: Secondary | ICD-10-CM

## 2016-11-27 DIAGNOSIS — I25118 Atherosclerotic heart disease of native coronary artery with other forms of angina pectoris: Secondary | ICD-10-CM

## 2016-11-27 DIAGNOSIS — N183 Chronic kidney disease, stage 3 (moderate): Secondary | ICD-10-CM

## 2016-11-27 DIAGNOSIS — N179 Acute kidney failure, unspecified: Secondary | ICD-10-CM

## 2016-11-27 DIAGNOSIS — I480 Paroxysmal atrial fibrillation: Secondary | ICD-10-CM

## 2016-11-27 DIAGNOSIS — I350 Nonrheumatic aortic (valve) stenosis: Secondary | ICD-10-CM

## 2016-11-27 DIAGNOSIS — E7800 Pure hypercholesterolemia, unspecified: Secondary | ICD-10-CM

## 2016-11-27 LAB — BASIC METABOLIC PANEL
Anion gap: 8 (ref 5–15)
BUN: 74 mg/dL — AB (ref 6–20)
CO2: 24 mmol/L (ref 22–32)
CREATININE: 3.47 mg/dL — AB (ref 0.44–1.00)
Calcium: 9.2 mg/dL (ref 8.9–10.3)
Chloride: 103 mmol/L (ref 101–111)
GFR calc Af Amer: 13 mL/min — ABNORMAL LOW (ref >60)
GFR, EST NON AFRICAN AMERICAN: 11 mL/min — AB (ref >60)
Glucose, Bld: 179 mg/dL — ABNORMAL HIGH (ref 65–99)
Potassium: 4.4 mmol/L (ref 3.5–5.1)
SODIUM: 135 mmol/L (ref 135–145)

## 2016-11-27 LAB — GLUCOSE, CAPILLARY
GLUCOSE-CAPILLARY: 246 mg/dL — AB (ref 65–99)
GLUCOSE-CAPILLARY: 142 mg/dL — AB (ref 65–99)
GLUCOSE-CAPILLARY: 174 mg/dL — AB (ref 65–99)
Glucose-Capillary: 194 mg/dL — ABNORMAL HIGH (ref 65–99)

## 2016-11-27 LAB — CBC
HCT: 25.5 % — ABNORMAL LOW (ref 36.0–46.0)
Hemoglobin: 8.5 g/dL — ABNORMAL LOW (ref 12.0–15.0)
MCH: 29.5 pg (ref 26.0–34.0)
MCHC: 33.3 g/dL (ref 30.0–36.0)
MCV: 88.5 fL (ref 78.0–100.0)
PLATELETS: 170 10*3/uL (ref 150–400)
RBC: 2.88 MIL/uL — ABNORMAL LOW (ref 3.87–5.11)
RDW: 13.7 % (ref 11.5–15.5)
WBC: 3.4 10*3/uL — ABNORMAL LOW (ref 4.0–10.5)

## 2016-11-27 LAB — FOLATE: FOLATE: 13.7 ng/mL (ref >5.9)

## 2016-11-27 MED ORDER — INSULIN GLARGINE 100 UNIT/ML ~~LOC~~ SOLN
12.0000 [IU] | Freq: Every day | SUBCUTANEOUS | Status: DC
  Administered 2016-11-27 – 2016-11-28 (×2): 12 [IU] via SUBCUTANEOUS
  Filled 2016-11-27 (×2): qty 0.12

## 2016-11-27 MED ORDER — CARVEDILOL 12.5 MG PO TABS
12.5000 mg | ORAL_TABLET | Freq: Once | ORAL | Status: AC
  Administered 2016-11-27: 12.5 mg via ORAL
  Filled 2016-11-27: qty 1

## 2016-11-27 MED ORDER — CARVEDILOL 25 MG PO TABS: 25.0000 mg | ORAL_TABLET | Freq: Two times a day (BID) | ORAL | Status: DC

## 2016-11-27 MED ORDER — ENOXAPARIN SODIUM 80 MG/0.8ML ~~LOC~~ SOLN
70.0000 mg | Freq: Every day | SUBCUTANEOUS | Status: DC
  Administered 2016-11-27: 70 mg via SUBCUTANEOUS
  Filled 2016-11-27: qty 0.8

## 2016-11-27 MED ORDER — CARVEDILOL 6.25 MG PO TABS
6.2500 mg | ORAL_TABLET | Freq: Two times a day (BID) | ORAL | Status: DC
  Administered 2016-11-28 – 2016-11-29 (×3): 6.25 mg via ORAL
  Filled 2016-11-27 (×3): qty 1

## 2016-11-27 MED ORDER — CARVEDILOL 12.5 MG PO TABS: 12.5000 mg | ORAL_TABLET | Freq: Two times a day (BID) | ORAL | Status: DC

## 2016-11-27 MED ORDER — CARVEDILOL 6.25 MG PO TABS: 6.2500 mg | ORAL_TABLET | Freq: Two times a day (BID) | ORAL | Status: DC

## 2016-11-27 MED ORDER — HEPARIN SODIUM (PORCINE) 5000 UNIT/ML IJ SOLN: 5000.0000 [IU] | Freq: Three times a day (TID) | INTRAMUSCULAR | Status: DC

## 2016-11-27 NOTE — Progress Notes (Signed)
Checked on patient - no further episodes of significant pauses or symptomatic bradycardia. EKG NSR/SB 1st degree AVB. She denied any further acute sx. Continue to monitor closely. Dayna Dunn PA-C

## 2016-11-27 NOTE — Consult Note (Signed)
Referring Provider: [facility]   Primary Care Physician:  Kenn File, MD Primary Gastroenterologist: unassigned  Reason for Consultation:  anemia, heme positive stools  HPI: [name] is a 82 y.o. female with multiple medical problems not limited to CAD / admitted with NSTEMI, rapid Afib this admission, DM, HTN,  acute on chronic diastolic heart failure, CKD4, and chronic normocytic anemia.   Patient found to have worsening anemia. No overt GI bleeding. Takes full asa :"sometimes" for a headaches. Denies any other NSAIDS. She has no abdominal pain, weight loss, or overt bleeding. Recently constipated but resolved with laxatives. Constipation not unusual for her. Her brother died at 79 with colon cancer. Patient admitted with chest pain, denies shortness of breath or dizziness  Past Medical History:  Diagnosis Date  . CAD (coronary artery disease) 2003   Last catheterization 2008. Two-vessel PCI of the first OM branch and mid LAD.  Marland Kitchen CKD (chronic kidney disease)   . Congestive heart failure ([place])   . CVA (cerebral vascular accident) ([place]) [place]   . Dementia   . DJD (degenerative joint disease) of lumbar spine   . Hypercholesteremia   . Hypertension   . IDDM (insulin dependent diabetes mellitus) ([place])     Past Surgical History:  Procedure Laterality Date  . APPENDECTOMY    . KNEE SURGERY      Prior to Admission medications   Medication Sig Start Date End Date Taking? Authorizing Provider  amLODipine (NORVASC) 10 MG tablet Take 1 tablet (10 mg total) by mouth daily. 10/04/16  Yes Timmothy Euler, MD  aspirin EC 81 MG EC tablet Take 1 tablet (81 mg total) by mouth daily. 10/08/16  Yes Bhavinkumar Bhagat, PA  cholecalciferol (VITAMIN D) 1000 units tablet Take 1,000 Units by mouth daily.   Yes Historical Provider, MD  diclofenac sodium (VOLTAREN) 1 % GEL Apply 4 g topically 4 (four) times daily. 07/29/16  Yes Timmothy Euler, MD  Insulin Glargine  (LANTUS) 100 UNIT/ML Solostar Pen Inject 20-30 Units into the skin daily at 10 pm. 10/17/16  Yes Timmothy Euler, MD  isosorbide mononitrate (IMDUR) 120 MG 24 hr tablet TAKE 1 TABLET DAILY 05/12/16  Yes Timmothy Euler, MD  levothyroxine (SYNTHROID, LEVOTHROID) 50 MCG tablet Take 1 tablet (50 mcg total) by mouth daily. 08/24/16  Yes Fransisca Kaufmann Dettinger, MD  lisinopril (PRINIVIL,ZESTRIL) 40 MG tablet Take 1 tablet (40 mg total) by mouth daily. Patient taking differently: Take 20 mg by mouth daily.  01/28/16  Yes Timmothy Euler, MD  metoprolol succinate (TOPROL-XL) 25 MG 24 hr tablet Take 0.5 tablets (12.5 mg total) by mouth daily. 10/27/16  Yes Timmothy Euler, MD  nitroGLYCERIN (NITROSTAT) 0.4 MG SL tablet Place 1 tablet (0.4 mg total) under the tongue every 5 (five) minutes x 3 doses as needed for chest pain. 02/24/16  Yes Timmothy Euler, MD  rosuvastatin (CRESTOR) 10 MG tablet Take 1 tablet (10 mg total) by mouth daily. 10/17/16  Yes Timmothy Euler, MD  torsemide (DEMADEX) 20 MG tablet Take 1 tablet (20 mg total) by mouth daily. 11/08/16  Yes Timmothy Euler, MD  cloNIDine (CATAPRES) 0.2 MG tablet Take 1 tablet (0.2 mg total) by mouth 3 (three) times daily. 10/27/16   Timmothy Euler, MD    Current Facility-Administered Medications  Medication Dose Route Frequency Provider Last Rate Last Dose  . 0.9 %  sodium chloride infusion   Intravenous Once Belkys A  Regalado, MD      . acetaminophen (TYLENOL) tablet 650 mg  650 mg Oral Q4H PRN Evelene Croon Barrett, PA-C   650 mg at 11/24/16 1712  . ALPRAZolam Duanne Moron) tablet 0.25 mg  0.25 mg Oral BID PRN Evelene Croon Barrett, PA-C      . aspirin EC tablet 81 mg  81 mg Oral Daily Rhonda G Barrett, PA-C   81 mg at 11/27/16 1019  . bisacodyl (DULCOLAX) suppository 10 mg  10 mg Rectal Daily PRN Belkys A Regalado, MD      . carvedilol (COREG) tablet 25 mg  25 mg Oral BID WC Herminio Commons, MD      . cholecalciferol (VITAMIN D) tablet 1,000 Units  1,000  Units Oral Daily Rhonda G Barrett, PA-C   1,000 Units at 11/27/16 1019  . cloNIDine (CATAPRES) tablet 0.2 mg  0.2 mg Oral TID Evelene Croon Barrett, PA-C   0.2 mg at 11/27/16 1019  . cyanocobalamin ((VITAMIN B-12)) injection 1,000 mcg  1,000 mcg Subcutaneous Daily Debbe Odea, MD   1,000 mcg at 11/27/16 1018  . gi cocktail (Maalox,Lidocaine,Donnatal)  30 mL Oral BID PRN Erlene Quan, PA-C   30 mL at 11/26/16 2354  . hydrALAZINE (APRESOLINE) tablet 25 mg  25 mg Oral Q8H Dorothy Spark, MD   25 mg at 11/27/16 0559  . insulin aspart (novoLOG) injection 0-15 Units  0-15 Units Subcutaneous TID WC Evelene Croon Barrett, PA-C   3 Units at 11/27/16 [id number]  . insulin glargine (LANTUS) injection 10 Units  10 Units Subcutaneous QHS Debbe Odea, MD   10 Units at 11/26/16 2145  . isosorbide mononitrate (IMDUR) 24 hr tablet 30 mg  30 mg Oral Daily Dorothy Spark, MD   30 mg at 11/27/16 1019  . levofloxacin (LEVAQUIN) tablet 500 mg  500 mg Oral Q48H Rebecka Apley, RPH   500 mg at 11/27/16 [id number]  . levothyroxine (SYNTHROID, LEVOTHROID) tablet 50 mcg  50 mcg Oral QAC breakfast Evelene Croon Barrett, PA-C   50 mcg at 11/27/16 1022  . magnesium hydroxide (MILK OF MAGNESIA) suspension 30 mL  30 mL Oral Daily PRN Thayer Headings, MD   30 mL at 11/25/16 1029  . MEDLINE mouth rinse  15 mL Mouth Rinse BID Thayer Headings, MD   15 mL at 11/27/16 1022  . nitroGLYCERIN (NITROSTAT) SL tablet 0.4 mg  0.4 mg Sublingual Q5 Min x 3 PRN Rhonda G Barrett, PA-C      . pantoprazole (PROTONIX) EC tablet 40 mg  40 mg Oral BID Doreene Burke Kilroy, PA-C   40 mg at 11/27/16 1019  . rosuvastatin (CRESTOR) tablet 10 mg  10 mg Oral Daily Rhonda G Barrett, PA-C   10 mg at 11/27/16 1019  . tamsulosin (FLOMAX) capsule 0.4 mg  0.4 mg Oral QPC supper Belkys A Regalado, MD   0.4 mg at 11/26/16 1741  . zolpidem (AMBIEN) tablet 5 mg  5 mg Oral QHS PRN Rhonda G Barrett, PA-C        Allergies as of 11/17/2016 - Review Complete 11/17/2016  Allergen Reaction Noted    . Propoxyphene n-acetaminophen    . Simvastatin Other (See Comments) 03/30/2011    Family History  Problem Relation Age of Onset  . Heart attack Mother 32  . Heart attack Father 16  . Diabetes Brother     RETINOPATHY   . Drug abuse Brother   . Liver cancer Brother   . Breast cancer Daughter   .  Diabetes Sister     Social History   Social History  . Marital status: Widowed    Spouse name: N/A  . Number of children: N/A  . Years of education: N/A   Occupational History  . Retired    Social History Main Topics  . Smoking status: Never Smoker  . Smokeless tobacco: Never Used  . Alcohol use No  . Drug use: No  . Sexual activity: No   Other Topics Concern  . Not on file   Social History Narrative      Lives alone.      Review of Systems: Positive for arthritis. All other systems reviewed and negative except where noted in HPI.  Physical Exam: Vital signs in last 24 hours: Temp:  [98.1 F (36.7 C)-98.5 F (36.9 C)] 98.3 F (36.8 C) (12/31 0749) Pulse Rate:  [57-117] 117 (12/31 0520) Resp:  [16] 16 (12/31 0520) BP: (115-134)/(46-64) 115/64 (12/31 0520) SpO2:  [92 %-96 %] 96 % (12/31 0520) Weight:  [151 lb 9.6 oz (68.8 kg)] 151 lb 9.6 oz (68.8 kg) (12/31 0520) Last BM Date: 11/25/16 General:   Alert,  Obese white female in NAD Head:  Normocephalic and atraumatic. Eyes:  Sclera clear, no icterus.   Conjunctiva pink. Ears:  Normal auditory acuity. Nose:  No deformity, discharge,  or lesions. Neck:  Supple; no masses. Lungs:  Clear throughout to auscultation.   No wheezes, crackles, or rhonchi.  Heart:  Regular rate and rhythm; no murmurs, Trace BLE edema Abdomen:  Soft,nontender, BS active,nonpalp mass     Msk:  Symmetrical without gross deformities. . Pulses:  Normal pulses noted. Neurologic:  Alert and  oriented x4;  grossly normal neurologically. Skin:  Intact without significant lesions or rashes.. Psych:  Alert and cooperative. Normal mood and  affect.  Intake/Output from previous day: 12/30 0701 - 12/31 0700 In: 1690 [I.V.:1690] Out: 600 [Urine:600] Intake/Output this shift: Total I/O In: 200 [P.O.:200] Out: -   Lab Results:  Recent Labs  11/25/16 0503 11/26/16 0418 11/27/16 0544  WBC 2.3* 4.1 3.4*  HGB 7.3* 9.1* 8.5*  HCT 21.8* 26.8* 25.5*  PLT 155 160 170   BMET  Recent Labs  11/25/16 0743 11/26/16 0427 11/27/16 0544  NA 134* 133* 135  K 4.3 4.5 4.4  CL 101 99* 103  CO2 21* 23 24  GLUCOSE 202* 257* 179*  BUN 84* 85* 74*  CREATININE 4.08* 3.75* 3.47*  CALCIUM 9.2 9.3 9.2     IMPRESSION / PLAN:   1. 82 yo with CAD, admitted with NSTEMI. Didn't want cath. Managing with ASA and beta blockers.   2. Acute on CHRONIC normocytic anemia / heme positive stools. Chronic anemia likely secondary to chronic disease with CKD contributing. Ferritin 887, TIBC 148.  Baseline has hgb fluctuated 9 -11 range for years but drifted to 7 over last several days.(while on heparin gtt).  Given a unit of blood last night with appropriate rise in hgb to 8.5 today. Denies overt GIB No weight loss.  -baby asa on home list, states she takes a full aspirin but not everyday but just with headache. Wallie Renshaw may take 2 full asa. Total aspirin intake sounds minimal overall. Denies any other NSAID use. No upper GI symptoms -not unreasonable to keep her on a PPI for mucosal protection since on aspirin.  -Never had a colonoscopy. Recalls brother had colon cancer in his 80s. Patient recently constipated, no BM in 8 days. Constipation now resolved and not really unusual  for her. Patient at increased risk for endoscopic workup,  doesn't want a colonoscopy anyway. .  3.  CKD 4 and multiple other medical problems as listed above.      Tye Savoy NP  11/27/2016, 10:41 AM  Pager number [phone]

## 2016-11-27 NOTE — Progress Notes (Signed)
PROGRESS NOTE    MAIZIE UPTHEGROVE  [id number] DOB: 07/10/1932 DOA: 11/17/2016 PCP: Kenn File, MD    Brief Narrative: 82 year-old female with history of insulin-dependent diabetes mellitus, hypertension, chronic kidney disease stage IV coronary artery disease with LAD balloon angioplasty in 2003 and LAD DS and OM DES in 2008. She was admitted in 2013 for an NSTEMI but declined cardiac cath and was medically managed. Patient was hospitalized about 6 weeks back back with chest pain, was ruled out for ACS but found to have accelerated hypertension. Patient returned to the ED on 12/21 with lower extremity edema or >2 weeks, increase dyspnea on exertion and woke up with chest pain symptoms. She was diaphoretic with EKG showing lateral ST depression and inverted T waves.  Patient was admitted for NSTEMI. She refused cardiac cath and was being managed medically.  She was also diuresed with IV Lasix for acute on chronic dCHF. She however developed acute on chronic kidney disease, diuretics held and started on gentle hydration. Nephrology was consulted. Hospital course also complicated with urinary retention requiring frequent in and out catheterization. Subsequently foley was placed.  Hospitalist consulted on 12/27 for low-grade fever with vaginal discharge. Found to have + UA and pneumonia and started on antibiotics, Vanc and Cefepime, for HCAP and UTI. Patient transferred to Hospitalist service on 12/29.    Assessment & Plan:   ACS (acute coronary syndrome) ([facility] [place]) Pt refused cath. Medical management per cardiology-  On ASA, coreg and statin.  Paroxysmal A-fib- new diagnosis - increase Coreg from 6.25 to 12.5 this AM for rate control - heme +- have asked GI to evaluate as she will need to be anticoagulated   Heme + stool - Anemia in chronic kidney disease - Hb trending down- transfused one unit PRBC- Hb 7.3 >> 9.1- no acute bleeding - ferretin high (has been normal to high in the  past) - check folate -  B12 258- will give s/c B12 - FOB + - GI eval   UTI; UA with too numerous to count WBC.  Started on cefepime and now on Levaquin- will need 7 days   PNA;  - cough with yellow sputum for 2-3 days- crackles in LLL and LLL infiltrate on CXR Started treatment for health care associated PNA with IV vancomycin and cefepime - d/c Vanc- changed Cefepime to Levaquin to which above UTI is also sensitive- follow QTc  Leukopenia;  - WBC 2.3 on 12/29 only- ? Erroneous result -  now at her baseline-  Acute encephalopathy;  Suspect multifactorial. Infection, renal failure - resolved  Acute on chronic Renal failure;  Related to diuresis and likely obstructive.  Baseline creat = 2.6- 3.1.  Nephrology feels it is due to volume depletion and has signed off - on slow IVF- Cr improving - follows with Dr Hinda Lenis in [place]   Acute on chronic diastolic CHF (congestive heart failure), NYHA class 4   - has normal EF but high ventricular filling pressures and mod LVH -patient was started on diuretics which are on hold due to worsening renal function.   ? U retention - foley placed on 12/25-  started on Flomax- voiding trial today   Vaginal discharge - candida -has curdy vaginal discharge since 12/25 - treated with Diflucan- will d/c today    Uncontrolled hypertension Stable.  Diabetes mellitus type 2 - sliding scale coverage & Lantus- increased dose of lantus today due to elevated sugars   DVT prophylaxis: SCD Code Status: DNR Family Communication:  care discussed with patient.  Disposition Plan:  SNF  Consultants:   Cardiology   Nephrology     Procedures:  ECHO; Ef 65 %    Antimicrobials:   Vancomycin 12-29- 12/30  Cefepime. 12-29- 12/30  - Levaquin 12/30   Subjective: Cough is improving- no new complaints.   Objective: Vitals:   11/26/16 2141 11/26/16 2344 11/27/16 0520 11/27/16 0749  BP: 129/60 (!) 126/54 115/64   Pulse:  96 (!) 117    Resp:   16   Temp:  98.1 F (36.7 C) 98.5 F (36.9 C) 98.3 F (36.8 C)  TempSrc:  Axillary Axillary Oral  SpO2:  92% 96%   Weight:   68.8 kg (151 lb 9.6 oz)   Height:        Intake/Output Summary (Last 24 hours) at 11/27/16 0903 Last data filed at 11/27/16 0843  Gross per 24 hour  Intake             1890 ml  Output              600 ml  Net             1290 ml   Filed Weights   11/24/16 0339 11/26/16 0529 11/27/16 0520  Weight: 65.9 kg (145 lb 4.8 oz) 68.3 kg (150 lb 9.6 oz) 68.8 kg (151 lb 9.6 oz)    Examination:  General exam: Appears calm and comfortable , sleepy  Respiratory system: Clear to auscultation. Respiratory effort normal. Cardiovascular system: S1 & S2 heard, RRR. No JVD, murmurs, rubs, gallops or clicks. No pedal edema. Gastrointestinal system: Abdomen is nondistended, soft and nontender. No organomegaly or masses felt. Normal bowel sounds heard. Central nervous system: Alert and oriented. No focal neurological deficits. Skin: No rashes, lesions or ulcers Psychiatry: Judgement and insight appear normal. Mood & affect appropriate.     Data Reviewed: I have personally reviewed following labs and imaging studies  CBC:  Recent Labs Lab 11/23/16 0536 11/24/16 0302 11/25/16 0503 11/26/16 0418 11/27/16 0544  WBC 4.4 4.2 2.3* 4.1 3.4*  HGB 8.3* 7.8* 7.3* 9.1* 8.5*  HCT 25.2* 23.4* 21.8* 26.8* 25.5*  MCV 90.3 89.7 89.7 87.9 88.5  PLT 166 155 155 160 123XX123   Basic Metabolic Panel:  Recent Labs Lab 11/23/16 0536 11/24/16 0302 11/25/16 0743 11/26/16 0427 11/27/16 0544  NA 135 132* 134* 133* 135  K 4.0 4.4 4.3 4.5 4.4  CL 94* 95* 101 99* 103  CO2 31 26 21* 23 24  GLUCOSE 175* 203* 202* 257* 179*  BUN 80* 87* 84* 85* 74*  CREATININE 4.69* 4.62* 4.08* 3.75* 3.47*  CALCIUM 9.5 9.0 9.2 9.3 9.2   GFR: Estimated Creatinine Clearance: 11.9 mL/min (by C-G formula based on SCr of 3.47 mg/dL (H)). Liver Function Tests: No results for input(s): AST, ALT,  ALKPHOS, BILITOT, PROT, ALBUMIN in the last 168 hours. No results for input(s): LIPASE, AMYLASE in the last 168 hours. No results for input(s): AMMONIA in the last 168 hours. Coagulation Profile: No results for input(s): INR, PROTIME in the last 168 hours. Cardiac Enzymes: No results for input(s): CKTOTAL, CKMB, CKMBINDEX, TROPONINI in the last 168 hours. BNP (last 3 results) No results for input(s): PROBNP in the last 8760 hours. HbA1C: No results for input(s): HGBA1C in the last 72 hours. CBG:  Recent Labs Lab 11/26/16 0733 11/26/16 1107 11/26/16 1617 11/26/16 2140 11/27/16 0748  GLUCAP 218* 199* 196* 161* 194*   Lipid Profile: No results for input(s): CHOL,  HDL, LDLCALC, TRIG, CHOLHDL, LDLDIRECT in the last 72 hours. Thyroid Function Tests: No results for input(s): TSH, T4TOTAL, FREET4, T3FREE, THYROIDAB in the last 72 hours. Anemia Panel:  Recent Labs  11/25/16 0743 11/26/16 0418 11/27/16 0544  VITAMINB12 258  --   --   FOLATE  --   --  13.7  FERRITIN  --  887*  --   TIBC 165*  --   --   IRON 17*  --   --    Sepsis Labs: No results for input(s): PROCALCITON, LATICACIDVEN in the last 168 hours.  Recent Results (from the past 240 hour(s))  MRSA PCR Screening     Status: None   Collection Time: 11/17/16  6:11 PM  Result Value Ref Range Status   MRSA by PCR NEGATIVE NEGATIVE Final    Comment:        The GeneXpert MRSA Assay (FDA approved for NASAL specimens only), is one component of a comprehensive MRSA colonization surveillance program. It is not intended to diagnose MRSA infection nor to guide or monitor treatment for MRSA infections.   Urine culture     Status: Abnormal   Collection Time: 11/18/16  5:35 PM  Result Value Ref Range Status   Specimen Description URINE, CATHETERIZED  Final   Special Requests NONE  Final   Culture MULTIPLE SPECIES PRESENT, SUGGEST RECOLLECTION (A)  Final   Report Status 11/19/2016 FINAL  Final  Culture, Urine      Status: None   Collection Time: 11/20/16  1:28 PM  Result Value Ref Range Status   Specimen Description URINE, CATHETERIZED  Final   Special Requests Normal  Final   Culture NO GROWTH  Final   Report Status 11/21/2016 FINAL  Final  Culture, Urine     Status: Abnormal   Collection Time: 11/24/16  6:03 PM  Result Value Ref Range Status   Specimen Description URINE, CATHETERIZED  Final   Special Requests NONE  Final   Culture >=100,000 COLONIES/mL KLEBSIELLA PNEUMONIAE (A)  Final   Report Status 11/26/2016 FINAL  Final   Organism ID, Bacteria KLEBSIELLA PNEUMONIAE (A)  Final      Susceptibility   Klebsiella pneumoniae - MIC*    AMPICILLIN 16 RESISTANT Resistant     CEFAZOLIN <=4 SENSITIVE Sensitive     CEFTRIAXONE <=1 SENSITIVE Sensitive     CIPROFLOXACIN <=0.25 SENSITIVE Sensitive     GENTAMICIN <=1 SENSITIVE Sensitive     IMIPENEM <=0.25 SENSITIVE Sensitive     NITROFURANTOIN 32 SENSITIVE Sensitive     TRIMETH/SULFA <=20 SENSITIVE Sensitive     AMPICILLIN/SULBACTAM <=2 SENSITIVE Sensitive     PIP/TAZO <=4 SENSITIVE Sensitive     Extended ESBL NEGATIVE Sensitive     * >=100,000 COLONIES/mL KLEBSIELLA PNEUMONIAE  Culture, blood (Routine X 2) w Reflex to ID Panel     Status: None (Preliminary result)   Collection Time: 11/25/16  7:45 AM  Result Value Ref Range Status   Specimen Description BLOOD LEFT HAND  Final   Special Requests BOTTLES DRAWN AEROBIC AND ANAEROBIC 5CC EA  Final   Culture NO GROWTH 1 DAY  Final   Report Status PENDING  Incomplete  Culture, blood (Routine X 2) w Reflex to ID Panel     Status: None (Preliminary result)   Collection Time: 11/25/16  7:55 AM  Result Value Ref Range Status   Specimen Description BLOOD RIGHT HAND  Final   Special Requests BOTTLES DRAWN AEROBIC AND ANAEROBIC 5CCEA  Final  Culture NO GROWTH 1 DAY  Final   Report Status PENDING  Incomplete         Radiology Studies: No results found.      Scheduled Meds: . sodium chloride    Intravenous Once  . amLODipine  10 mg Oral Daily  . aspirin EC  81 mg Oral Daily  . carvedilol  12.5 mg Oral BID WC  . cholecalciferol  1,000 Units Oral Daily  . cloNIDine  0.2 mg Oral TID  . cyanocobalamin  1,000 mcg Subcutaneous Daily  . hydrALAZINE  25 mg Oral Q8H  . insulin aspart  0-15 Units Subcutaneous TID WC  . insulin glargine  10 Units Subcutaneous QHS  . isosorbide mononitrate  30 mg Oral Daily  . levofloxacin  500 mg Oral Q48H  . levothyroxine  50 mcg Oral QAC breakfast  . mouth rinse  15 mL Mouth Rinse BID  . pantoprazole  40 mg Oral BID  . rosuvastatin  10 mg Oral Daily  . tamsulosin  0.4 mg Oral QPC supper   Continuous Infusions:    LOS: 10 days    Time spent: 35 minutes,.     Debbe Odea, MD [facility] Pager: [email or web address] Password [email or web address] 11/27/2016, 9:03 AM

## 2016-11-27 NOTE — Progress Notes (Addendum)
Patient Name: [name] Date of Encounter: 11/27/2016  Primary Cardiologist: [facility] [place] Problem List     Principal Problem:   ACS (acute coronary syndrome) [facility]) Active Problems:   Uncontrolled hypertension   CAD S/P PCI '03 and '08   Anemia in chronic kidney disease   Insulin dependent diabetes mellitus with complications (HCC)   Chronic kidney disease, stage IV (severe) (HCC)   Acute on chronic diastolic CHF (congestive heart failure), NYHA class 4 ([place])   Pressure injury of skin     Subjective   Said she "feels funny in my chest". Denies pain per se.  Inpatient Medications    Scheduled Meds: . sodium chloride   Intravenous Once  . aspirin EC  81 mg Oral Daily  . carvedilol  12.5 mg Oral Once  . carvedilol  25 mg Oral BID WC  . cholecalciferol  1,000 Units Oral Daily  . cloNIDine  0.2 mg Oral TID  . cyanocobalamin  1,000 mcg Subcutaneous Daily  . hydrALAZINE  25 mg Oral Q8H  . insulin aspart  0-15 Units Subcutaneous TID WC  . insulin glargine  10 Units Subcutaneous QHS  . isosorbide mononitrate  30 mg Oral Daily  . levofloxacin  500 mg Oral Q48H  . levothyroxine  50 mcg Oral QAC breakfast  . mouth rinse  15 mL Mouth Rinse BID  . pantoprazole  40 mg Oral BID  . rosuvastatin  10 mg Oral Daily  . tamsulosin  0.4 mg Oral QPC supper   Continuous Infusions:  PRN Meds: acetaminophen, ALPRAZolam, bisacodyl, gi cocktail, magnesium hydroxide, nitroGLYCERIN, zolpidem   Vital Signs    Vitals:   11/26/16 2141 11/26/16 2344 11/27/16 0520 11/27/16 0749  BP: 129/60 (!) 126/54 115/64   Pulse:  96 (!) 117   Resp:   16   Temp:  98.1 F (36.7 C) 98.5 F (36.9 C) 98.3 F (36.8 C)  TempSrc:  Axillary Axillary Oral  SpO2:  92% 96%   Weight:   151 lb 9.6 oz (68.8 kg)   Height:        Intake/Output Summary (Last 24 hours) at 11/27/16 1004 Last data filed at 11/27/16 0843  Gross per 24 hour  Intake             1890 ml  Output              600 ml  Net              1290 ml   Filed Weights   11/24/16 0339 11/26/16 0529 11/27/16 0520  Weight: 145 lb 4.8 oz (65.9 kg) 150 lb 9.6 oz (68.3 kg) 151 lb 9.6 oz (68.8 kg)    Physical Exam    GEN: Well nourished, well developed, in no acute distress.  HEENT: Grossly normal.  Neck: Supple, no JVD, carotid bruits, or masses. Cardiac: Tachycardic, irregular, no murmurs, rubs, or gallops. No clubbing, cyanosis, edema.   Respiratory:  Diminished with crackles on left 1/3 up GI: Soft, nontender, nondistended. MS: no deformity or atrophy. Skin: warm and dry, no rash. Psych: AA.  Normal affect.  Labs    CBC  Recent Labs  11/26/16 0418 11/27/16 0544  WBC 4.1 3.4*  HGB 9.1* 8.5*  HCT 26.8* 25.5*  MCV 87.9 88.5  PLT 160 123XX123   Basic Metabolic Panel  Recent Labs  11/26/16 0427 11/27/16 0544  NA 133* 135  K 4.5 4.4  CL 99* 103  CO2 23 24  GLUCOSE 257* 179*  BUN 85* 74*  CREATININE 3.75* 3.47*  CALCIUM 9.3 9.2   Liver Function Tests No results for input(s): AST, ALT, ALKPHOS, BILITOT, PROT, ALBUMIN in the last 72 hours. No results for input(s): LIPASE, AMYLASE in the last 72 hours. Cardiac Enzymes No results for input(s): CKTOTAL, CKMB, CKMBINDEX, TROPONINI in the last 72 hours. BNP Invalid input(s): POCBNP D-Dimer No results for input(s): DDIMER in the last 72 hours. Hemoglobin A1C No results for input(s): HGBA1C in the last 72 hours. Fasting Lipid Panel No results for input(s): CHOL, HDL, LDLCALC, TRIG, CHOLHDL, LDLDIRECT in the last 72 hours. Thyroid Function Tests No results for input(s): TSH, T4TOTAL, T3FREE, THYROIDAB in the last 72 hours.  Invalid input(s): FREET3  Telemetry    A fib (HR 90-120 bpm) - Personally Reviewed  ECG     - Personally Reviewed  Radiology    No results found.  Cardiac Studies     Patient Profile     82 year-old female with history of insulin-dependent diabetes mellitus, hypertension, chronic kidney disease stage IV coronary  artery disease with LAD balloon angioplasty in 2003 and LAD DS and OM DES in 2008. She was admitted in 2013 for an NSTEMI but declined cardiac cath and was medically managed.Patient was hospitalized about 6 weeks back back with chest pain, was ruled out for ACS but found to have accelerated hypertension. Patient returned to the ED on 12/21 with lower extremity edema or >2 weeks, increase dyspnea on exertion and woke up with chest pain symptoms. She was diaphoretic with EKG showing lateral ST depression and inverted T waves.  Patient was admitted for NSTEMI.She refused cardiac cath and was being managed medically.  She was also diuresed with IV Lasix for acute on chronic dCHF. She however developed acute on chronic kidney disease, diuretics held and started on gentle hydration. Nephrology was consulted. Hospital course also complicated with urinary retention requiring frequent in and out catheterization. Subsequently foley was placed.  Hospitalist consulted on 12/27 for low-grade fever with vaginal discharge. Found to have + UA and pneumonia and started on antibiotics, Vanc and Cefepime, for HCAP and UTI. Patient transferred to Hospitalist service on 12/29.   Assessment & Plan    1. CAD:  Pt admitted with NSTEMI.   The patient did not want a cardiac cath.  Medical management on ASA, Coreg, nitrates, and Crestor.  2. Rapid atrial fib: Had been maintaining NSR. Being driven by pneumonia and UTI. I will increase Coreg for rate control and stop amlodipine to avoid hypotension. Not an anticoagulation candidate as per previous notes.  3. PNA: Rx per IM.  4. HTN:   I will increase Coreg for rate control and stop amlodipine to avoid hypotension.  5. HLD: Continue Crestor.  6. A/C Diastolic HF: Euvolemic. Diuretics on hold due to renal insufficiency.  7. Aortic stenosis: Mild by most recent echo.  8. UTI: Rx per IM.   Signed, Kate Sable, MD  11/27/2016, 10:04 AM

## 2016-11-27 NOTE — Progress Notes (Signed)
ANTICOAGULATION CONSULT NOTE - Initial Consult  Pharmacy Consult for Lovenox Indication: atrial fibrillation  Allergies  Allergen Reactions  . Propoxyphene N-Acetaminophen     Pt does not know reaction  . Simvastatin Other (See Comments)    headache    Patient Measurements: Height: 5\' 4"  (162.6 cm) Weight: 150 lb 9.6 oz (68.3 kg) IBW/kg (Calculated) : 54.7  Vital Signs: Temp: 98.1 F (36.7 C) (12/30 2344) Temp Source: Axillary (12/30 2344) BP: 126/54 (12/30 2344) Pulse Rate: 96 (12/30 2344)  Labs:  Recent Labs  11/24/16 0302 11/25/16 0503 11/25/16 0743 11/26/16 0418 11/26/16 0427  HGB 7.8* 7.3*  --  9.1*  --   HCT 23.4* 21.8*  --  26.8*  --   PLT 155 155  --  160  --   CREATININE 4.62*  --  4.08*  --  3.75*    Estimated Creatinine Clearance: 11 mL/min (by C-G formula based on SCr of 3.75 mg/dL (H)).   Medical History: Past Medical History:  Diagnosis Date  . CAD (coronary artery disease) 2003   Last catheterization 2008. Two-vessel PCI of the first OM branch and mid LAD.  Marland Kitchen CKD (chronic kidney disease)   . Congestive heart failure ([place])   . CVA (cerebral vascular accident) ([place]) [place]   . Dementia   . DJD (degenerative joint disease) of lumbar spine   . Hypercholesteremia   . Hypertension   . IDDM (insulin dependent diabetes mellitus) ([place])     Medications:  Prescriptions Prior to Admission  Medication Sig Dispense Refill Last Dose  . amLODipine (NORVASC) 10 MG tablet Take 1 tablet (10 mg total) by mouth daily. 90 tablet 0 11/16/2016 at Unknown time  . aspirin EC 81 MG EC tablet Take 1 tablet (81 mg total) by mouth daily. 30 tablet 11 11/16/2016 at Unknown time  . cholecalciferol (VITAMIN D) 1000 units tablet Take 1,000 Units by mouth daily.   11/16/2016 at Unknown time  . diclofenac sodium (VOLTAREN) 1 % GEL Apply 4 g topically 4 (four) times daily. 100 g 2  at prn  . Insulin Glargine (LANTUS) 100 UNIT/ML Solostar Pen Inject 20-30 Units  into the skin daily at 10 pm. 15 mL 5 11/16/2016 at Unknown time  . isosorbide mononitrate (IMDUR) 120 MG 24 hr tablet TAKE 1 TABLET DAILY 30 tablet 5 11/16/2016 at Unknown time  . levothyroxine (SYNTHROID, LEVOTHROID) 50 MCG tablet Take 1 tablet (50 mcg total) by mouth daily. 90 tablet 0 11/16/2016 at Unknown time  . lisinopril (PRINIVIL,ZESTRIL) 40 MG tablet Take 1 tablet (40 mg total) by mouth daily. (Patient taking differently: Take 20 mg by mouth daily. ) 90 tablet 3 11/16/2016 at Unknown time  . metoprolol succinate (TOPROL-XL) 25 MG 24 hr tablet Take 0.5 tablets (12.5 mg total) by mouth daily. 30 tablet 6 11/16/2016 at Unknown time  . nitroGLYCERIN (NITROSTAT) 0.4 MG SL tablet Place 1 tablet (0.4 mg total) under the tongue every 5 (five) minutes x 3 doses as needed for chest pain. 30 tablet 11 11/17/2016 at Unknown time  . rosuvastatin (CRESTOR) 10 MG tablet Take 1 tablet (10 mg total) by mouth daily. 30 tablet 5 11/16/2016 at Unknown time  . torsemide (DEMADEX) 20 MG tablet Take 1 tablet (20 mg total) by mouth daily. 30 tablet 1 11/16/2016 at Unknown time  . cloNIDine (CATAPRES) 0.2 MG tablet Take 1 tablet (0.2 mg total) by mouth 3 (three) times daily. 90 tablet 3 Taking    Assessment:  82 y.o. F known to pharmacy from abx dosing. To begin Lovenox for afib. Hgb 9.1 - low but stable. Pt with CKD stage 4. Est CrCl 11 ml/min.  Goal of Therapy:  Anti-Xa level 0.6-1 units/ml 4hrs after LMWH dose given Monitor platelets by anticoagulation protocol: Yes   Plan:  Lovenox 70mg  SQ q24h CBC q72h while on Lovenox F/u oral AC plans. Likely will need Lovenox with CKD stage 4.  Sherlon Handing, PharmD, BCPS Clinical pharmacist, pager [phone] 11/27/2016,12:21 AM

## 2016-11-27 NOTE — Progress Notes (Signed)
Central telemetry  reported 6 sec SA then conversion to NSR. Upon nurse entry to room pt sitting at side of bed. Pt alert and oriented. Denies CP/SOB. Slightly diaphorectic and reports mild nausea. VSS. STAT EKG obtained. Provider(Dunn) made aware. Will continue to monitor.

## 2016-11-27 NOTE — Progress Notes (Signed)
CCMD notified RN at 2227 that pt went into afib. RN checked on pt. HR between 90-120s, BP 126/54. EKG obtained. Notified on call provider. New orders lovenox 70mg  sq. Will continue to monitor

## 2016-11-27 NOTE — Progress Notes (Signed)
Notice of post-termination pause promptly received, pt felt nauseated/diaphoretic, now feeling better and VSS. Reviewed with rounding MD - will revert carvedilol back to 6.25mg  BID dose starting at 8pm tonight. Follow carefully on telemetry. Asked nurse to inform us of any additional pauses, slow HR or reversion to arrhythmia. Dayna Dunn PA-C

## 2016-11-28 DIAGNOSIS — N179 Acute kidney failure, unspecified: Secondary | ICD-10-CM

## 2016-11-28 DIAGNOSIS — I4891 Unspecified atrial fibrillation: Secondary | ICD-10-CM

## 2016-11-28 LAB — BASIC METABOLIC PANEL
ANION GAP: 12 (ref 5–15)
BUN: 74 mg/dL — ABNORMAL HIGH (ref 6–20)
CALCIUM: 9.6 mg/dL (ref 8.9–10.3)
CO2: 21 mmol/L — ABNORMAL LOW (ref 22–32)
Chloride: 102 mmol/L (ref 101–111)
Creatinine, Ser: 3.41 mg/dL — ABNORMAL HIGH (ref 0.44–1.00)
GFR calc non Af Amer: 12 mL/min — ABNORMAL LOW (ref >60)
GFR, EST AFRICAN AMERICAN: 13 mL/min — AB (ref >60)
Glucose, Bld: 146 mg/dL — ABNORMAL HIGH (ref 65–99)
POTASSIUM: 4.3 mmol/L (ref 3.5–5.1)
SODIUM: 135 mmol/L (ref 135–145)

## 2016-11-28 LAB — GLUCOSE, CAPILLARY
GLUCOSE-CAPILLARY: 161 mg/dL — AB (ref 65–99)
GLUCOSE-CAPILLARY: 152 mg/dL — AB (ref 65–99)
Glucose-Capillary: 134 mg/dL — ABNORMAL HIGH (ref 65–99)
Glucose-Capillary: 145 mg/dL — ABNORMAL HIGH (ref 65–99)

## 2016-11-28 LAB — CBC
HCT: 26.1 % — ABNORMAL LOW (ref 36.0–46.0)
Hemoglobin: 8.8 g/dL — ABNORMAL LOW (ref 12.0–15.0)
MCH: 29.5 pg (ref 26.0–34.0)
MCHC: 33.7 g/dL (ref 30.0–36.0)
MCV: 87.6 fL (ref 78.0–100.0)
Platelets: 209 10*3/uL (ref 150–400)
RBC: 2.98 MIL/uL — ABNORMAL LOW (ref 3.87–5.11)
RDW: 13.3 % (ref 11.5–15.5)
WBC: 3.4 10*3/uL — ABNORMAL LOW (ref 4.0–10.5)

## 2016-11-28 MED ORDER — ONDANSETRON HCL 4 MG/2ML IJ SOLN
4.0000 mg | Freq: Four times a day (QID) | INTRAMUSCULAR | Status: DC | PRN
  Administered 2016-11-28: 4 mg via INTRAVENOUS
  Filled 2016-11-28: qty 2

## 2016-11-28 MED ORDER — SODIUM CHLORIDE 0.9 % IV SOLN
INTRAVENOUS | Status: DC
  Administered 2016-11-28: 09:00:00 via INTRAVENOUS

## 2016-11-28 MED ORDER — AMLODIPINE BESYLATE 10 MG PO TABS
10.0000 mg | ORAL_TABLET | Freq: Every day | ORAL | Status: DC
  Administered 2016-11-28 – 2016-11-29 (×2): 10 mg via ORAL
  Filled 2016-11-28 (×2): qty 1

## 2016-11-28 NOTE — Progress Notes (Addendum)
The client has not voided since her in and out cath early this morning by night shift. Bladder scan only showed 107 ml  earlier this morning. Now showing 526 ml (please see flow sheet). 500 ml out with cath at this time. Will continue to monitor the client closely.   Saddie Benders RN  Addendum: The client didn't void after her in and out cath today. Bladder scan was over 300 again. Rizwan notified and ordered for a foley to be placed. I will continue to monitor the client closely.  Saddie Benders RN

## 2016-11-28 NOTE — Progress Notes (Signed)
Patient Name: [name] Date of Encounter: 11/28/2016  Primary Cardiologist: [facility] Problem List     Principal Problem:   ACS (acute coronary syndrome) [facility]) Active Problems:   Uncontrolled hypertension   CAD S/P PCI '03 and '08   Anemia in chronic kidney disease   Insulin dependent diabetes mellitus with complications (HCC)   Chronic kidney disease, stage IV (severe) (HCC)   Acute on chronic diastolic CHF (congestive heart failure), NYHA class 4 (HCC)   Pressure injury of skin   AKI (acute kidney injury) ([place])   PAF (paroxysmal atrial fibrillation) (HCC)     Subjective   Just vomited. Has some chest pain. Back in NSR, converted yesterday.  Inpatient Medications    Scheduled Meds: . sodium chloride   Intravenous Once  . amLODipine  10 mg Oral Daily  . aspirin EC  81 mg Oral Daily  . carvedilol  6.25 mg Oral BID  . cholecalciferol  1,000 Units Oral Daily  . cloNIDine  0.2 mg Oral TID  . cyanocobalamin  1,000 mcg Subcutaneous Daily  . hydrALAZINE  25 mg Oral Q8H  . insulin aspart  0-15 Units Subcutaneous TID WC  . insulin glargine  12 Units Subcutaneous QHS  . isosorbide mononitrate  30 mg Oral Daily  . levothyroxine  50 mcg Oral QAC breakfast  . mouth rinse  15 mL Mouth Rinse BID  . pantoprazole  40 mg Oral BID  . rosuvastatin  10 mg Oral Daily   Continuous Infusions: . sodium chloride 75 mL/hr at 11/28/16 0920   PRN Meds: acetaminophen, ALPRAZolam, bisacodyl, gi cocktail, magnesium hydroxide, nitroGLYCERIN, ondansetron (ZOFRAN) IV, zolpidem   Vital Signs    Vitals:   11/27/16 2123 11/28/16 0055 11/28/16 0436 11/28/16 0740  BP: (!) 135/52 (!) 153/49 (!) 153/57 (!) 152/63  Pulse:  60 65 70  Resp:  20 20 18   Temp:  97.9 F (36.6 C) 98.1 F (36.7 C) 98.3 F (36.8 C)  TempSrc:  Oral Oral Oral  SpO2:  96% 97% 97%  Weight:      Height:        Intake/Output Summary (Last 24 hours) at 11/28/16 0924 Last data filed at 11/28/16 0600  Gross per 24 hour  Intake              595 ml  Output             1100 ml  Net             -505 ml   Filed Weights   11/24/16 0339 11/26/16 0529 11/27/16 0520  Weight: 145 lb 4.8 oz (65.9 kg) 150 lb 9.6 oz (68.3 kg) 151 lb 9.6 oz (68.8 kg)    Physical Exam    GEN: Ill appearing but in no acute distress.  HEENT: Grossly normal.  Neck: Supple, no JVD Cardiac: RRR, 2/6 systolic murmur over RUSB and along LSB, no rubs or gallops. No clubbing, cyanosis, edema.   Respiratory:  Diminished with crackles on left 1/3 up GI: Soft, nondistended. Skin: warm and dry, no rash. Psych: Alert, oriented.  Normal affect.  Labs    CBC  Recent Labs  11/27/16 0544 11/28/16 0254  WBC 3.4* 3.4*  HGB 8.5* 8.8*  HCT 25.5* 26.1*  MCV 88.5 87.6  PLT 170 [id number]   Basic Metabolic Panel  Recent Labs  11/27/16 0544 11/28/16 0254  NA 135 135  K 4.4 4.3  CL 103 102  CO2 24 21*  GLUCOSE 179* 146*  BUN 74* 74*  CREATININE 3.47* 3.41*  CALCIUM 9.2 9.6   Liver Function Tests No results for input(s): AST, ALT, ALKPHOS, BILITOT, PROT, ALBUMIN in the last 72 hours. No results for input(s): LIPASE, AMYLASE in the last 72 hours. Cardiac Enzymes No results for input(s): CKTOTAL, CKMB, CKMBINDEX, TROPONINI in the last 72 hours. BNP Invalid input(s): POCBNP D-Dimer No results for input(s): DDIMER in the last 72 hours. Hemoglobin A1C No results for input(s): HGBA1C in the last 72 hours. Fasting Lipid Panel No results for input(s): CHOL, HDL, LDLCALC, TRIG, CHOLHDL, LDLDIRECT in the last 72 hours. Thyroid Function Tests No results for input(s): TSH, T4TOTAL, T3FREE, THYROIDAB in the last 72 hours.  Invalid input(s): FREET3  Telemetry    NSR, ST depressions - Personally Reviewed  ECG    Sinus bradycardia with 1st degree AVB- Personally Reviewed  Radiology    No results found.  Cardiac Studies     Patient Profile     83 year-old female with history of insulin-dependent diabetes  mellitus, hypertension, chronic kidney disease stage IV coronary artery disease with LAD balloon angioplasty in 2003 and LAD DS and OM DES in 2008. She was admitted in 2013 for an NSTEMI but declined cardiac cath and was medically managed.Patient was hospitalized about 6 weeks back back with chest pain, was ruled out for ACS but found to have accelerated hypertension. Patient returned to the ED on 12/21 with lower extremity edema or >2 weeks, increase dyspnea on exertion and woke up with chest pain symptoms. She was diaphoretic with EKG showing lateral ST depression and inverted T waves.  Patient was admitted for NSTEMI.She refused cardiac cath and was being managed medically. She was also diuresed with IV Lasix for acute on chronic dCHF. She however developed acute on chronic kidney disease, diuretics held and startedon gentle hydration. Nephrology was consulted. Hospital course also complicated with urinary retention requiring frequent in and out catheterization. Subsequently foley was placed.  Hospitalist consulted on 12/27 for low-grade fever with vaginal discharge. Found to have + UA and pneumonia and started on antibiotics, Vanc and Cefepime, for HCAP and UTI. Patient transferred to Hospitalist service on 12/29.   Assessment & Plan    1. CAD: Pt admitted with NSTEMI. The patient did not want a cardiac cath. Medical management on ASA, Coreg, nitrates, and Crestor.  2. Rapid atrial fib: Converted to NSR 11/27/16 after I gave her one dose of increased Coreg. Due to bradycardia, I reverted back to her original dose. Being driven by pneumonia and UTI. Not an anticoagulation candidate as per previous notes.  3. PNA: Rx per IM.  4. HTN:  I will resume home dose of amlodipine 10 mg daily.  5. HLD: Continue Crestor.  6. A/C Diastolic HF: Euvolemic. Diuretics on hold due to renal insufficiency.  7. Aortic stenosis: Mild by most recent echo.  8. UTI: Rx per IM.  Signed, Kate Sable, MD  11/28/2016, 9:24 AM

## 2016-11-28 NOTE — Clinical Social Work Note (Signed)
CSW notes that the patient plans to return home with [facility] services. CSW will sign off.   Liz Beach MSW, [place], [place], [phone]

## 2016-11-28 NOTE — Progress Notes (Addendum)
PROGRESS NOTE    [name]  [id number] DOB: Sep 19, 1932 DOA: 11/17/2016 PCP: Kenn File, MD    Brief Narrative: 83 year-old female with history of insulin-dependent diabetes mellitus, hypertension, chronic kidney disease stage IV coronary artery disease with LAD balloon angioplasty in 2003 and LAD DS and OM DES in 2008. She was admitted in 2013 for an NSTEMI but declined cardiac cath and was medically managed. Patient was hospitalized about 6 weeks back back with chest pain, was ruled out for ACS but found to have accelerated hypertension. Patient returned to the ED on 12/21 with lower extremity edema or >2 weeks, increase dyspnea on exertion and woke up with chest pain symptoms. She was diaphoretic with EKG showing lateral ST depression and inverted T waves.  Patient was admitted for NSTEMI. She refused cardiac cath and was being managed medically.  She was also diuresed with IV Lasix for acute on chronic dCHF. She however developed acute on chronic kidney disease, diuretics held and started on gentle hydration. Nephrology was consulted. Hospital course also complicated with urinary retention requiring frequent in and out catheterization. Subsequently foley was placed.  Hospitalist consulted on 12/27 for low-grade fever with vaginal discharge. Found to have + UA and pneumonia and started on antibiotics, Vanc and Cefepime, for HCAP and UTI. Patient transferred to Hospitalist service on 12/29.    Assessment & Plan: ACS (acute coronary syndrome)  / CAD - Pt refused cath in the past and again on this admission - Medical management per cardiology-  On ASA, coreg and statin.  Paroxysmal A-fib- new diagnosis CHA2DS2-VASc Score 7 - increase Coreg from 6.25 to 12.5 yesterday for rate control but became bradycardic and therefore dose dropped back - rate well controlled today - cardiology does not feel she is a candidate for anticoagulation - upon my discussion with her, her last fall  was over 6 months ago  - heme + stool-  have asked GI to evaluate as I feel she will need to be anticoagulated - see notes below   Heme + stool - Anemia in chronic kidney disease - Hb trending down- transfused one unit PRBC- Hb 7.3 >> 9.1- no acute bleeding - ferretin high (has been normal to high in the past) - check folate -  B12 258- will give s/c B12 - FOB + - GI eval>>>they feel her heme + stool is incidental-  no need to have endoscopy per GI- patient reluctant to have colonsocpy per GI notes - cont baby aspirin and cont close monitoring of Hb as outpt   UTI - K pneumonia Started on cefepime and then Levaquin- will need 7 days   PNA;  - cough with yellow sputum for 2-3 days- crackles in LLL and LLL infiltrate on CXR Started treatment for health care associated PNA with IV vancomycin and cefepime - d/c'd Vanc- changed Cefepime to Levaquin 12/31 to which above UTI is also sensitive - following QTc - nausea since starting Levaquin yesterday- not eating at all for past 24 hrs--- d/c Levaquin (Q48hrs) may need to change antibiotics- next dose due tomorrow- follow nausea- Zofran PRN  Nausea - as above  Leukopenia;  - mild - cause uncertain - follow  Acute encephalopathy;  Suspect multifactorial- Infection, renal failure - resolved  Acute on chronic Renal failure;  Related to diuresis and likely obstructive.  Baseline creat = 2.6- 3.1.  Nephrology feels it is due to volume depletion and has signed off - on slow IVF- Cr improving - follows with  Dr Hinda Lenis in [place]   Acute on chronic diastolic CHF (congestive heart failure), NYHA class 4   - has normal EF but high ventricular filling pressures and mod LVH -patient was started on diuretics which are on hold due to worsening renal function.   ? U retention - foley placed on 12/25-   voiding trial 12/31- voiding well now- d/c Flomax   Vaginal discharge - candida -has curdy vaginal discharge since 12/25 - treated with  Diflucan- d/c'd on 12/31    Uncontrolled hypertension Stable.  Diabetes mellitus type 2 - sliding scale coverage & Lantus    DVT prophylaxis: SCD Code Status: DNR Family Communication: care discussed with patient.  Disposition Plan:  SNF  Consultants:   Cardiology   Nephrology     Procedures:  ECHO; Ef 65 %    Antimicrobials:   Vancomycin 12-29- 12/30  Cefepime. 12-29- 12/30  - Levaquin 12/30   Subjective: Cough is improving- no new complaints.   Objective: Vitals:   11/27/16 2123 11/28/16 0055 11/28/16 0436 11/28/16 0740  BP: (!) 135/52 (!) 153/49 (!) 153/57 (!) 152/63  Pulse:  60 65 70  Resp:  20 20 18   Temp:  97.9 F (36.6 C) 98.1 F (36.7 C) 98.3 F (36.8 C)  TempSrc:  Oral Oral Oral  SpO2:  96% 97% 97%  Weight:      Height:        Intake/Output Summary (Last 24 hours) at 11/28/16 0825 Last data filed at 11/28/16 0600  Gross per 24 hour  Intake              795 ml  Output             1100 ml  Net             -305 ml   Filed Weights   11/24/16 0339 11/26/16 0529 11/27/16 0520  Weight: 65.9 kg (145 lb 4.8 oz) 68.3 kg (150 lb 9.6 oz) 68.8 kg (151 lb 9.6 oz)    Examination:  General exam: Appears calm and comfortable , sleepy  Respiratory system: Clear to auscultation. Respiratory effort normal. Cardiovascular system: S1 & S2 heard, RRR. No JVD, murmurs, rubs, gallops or clicks. No pedal edema. Gastrointestinal system: Abdomen is nondistended, soft and nontender. No organomegaly or masses felt. Normal bowel sounds heard. Central nervous system: Alert and oriented. No focal neurological deficits. Skin: No rashes, lesions or ulcers Psychiatry: Judgement and insight appear normal. Mood & affect appropriate.     Data Reviewed: I have personally reviewed following labs and imaging studies  CBC:  Recent Labs Lab 11/24/16 0302 11/25/16 0503 11/26/16 0418 11/27/16 0544 11/28/16 0254  WBC 4.2 2.3* 4.1 3.4* 3.4*  HGB 7.8* 7.3* 9.1*  8.5* 8.8*  HCT 23.4* 21.8* 26.8* 25.5* 26.1*  MCV 89.7 89.7 87.9 88.5 87.6  PLT 155 155 160 170 [id number]   Basic Metabolic Panel:  Recent Labs Lab 11/24/16 0302 11/25/16 0743 11/26/16 0427 11/27/16 0544 11/28/16 0254  NA 132* 134* 133* 135 135  K 4.4 4.3 4.5 4.4 4.3  CL 95* 101 99* 103 102  CO2 26 21* 23 24 21*  GLUCOSE 203* 202* 257* 179* 146*  BUN 87* 84* 85* 74* 74*  CREATININE 4.62* 4.08* 3.75* 3.47* 3.41*  CALCIUM 9.0 9.2 9.3 9.2 9.6   GFR: Estimated Creatinine Clearance: 12.1 mL/min (by C-G formula based on SCr of 3.41 mg/dL (H)). Liver Function Tests: No results for input(s): AST, ALT, ALKPHOS, BILITOT, PROT, ALBUMIN in  the last 168 hours. No results for input(s): LIPASE, AMYLASE in the last 168 hours. No results for input(s): AMMONIA in the last 168 hours. Coagulation Profile: No results for input(s): INR, PROTIME in the last 168 hours. Cardiac Enzymes: No results for input(s): CKTOTAL, CKMB, CKMBINDEX, TROPONINI in the last 168 hours. BNP (last 3 results) No results for input(s): PROBNP in the last 8760 hours. HbA1C: No results for input(s): HGBA1C in the last 72 hours. CBG:  Recent Labs Lab 11/27/16 0748 11/27/16 1114 11/27/16 1608 11/27/16 2127 11/28/16 0729  GLUCAP 194* 246* 142* 174* 161*   Lipid Profile: No results for input(s): CHOL, HDL, LDLCALC, TRIG, CHOLHDL, LDLDIRECT in the last 72 hours. Thyroid Function Tests: No results for input(s): TSH, T4TOTAL, FREET4, T3FREE, THYROIDAB in the last 72 hours. Anemia Panel:  Recent Labs  11/26/16 0418 11/27/16 0544  FOLATE  --  13.7  FERRITIN 887*  --    Sepsis Labs: No results for input(s): PROCALCITON, LATICACIDVEN in the last 168 hours.  Recent Results (from the past 240 hour(s))  Urine culture     Status: Abnormal   Collection Time: 11/18/16  5:35 PM  Result Value Ref Range Status   Specimen Description URINE, CATHETERIZED  Final   Special Requests NONE  Final   Culture MULTIPLE SPECIES  PRESENT, SUGGEST RECOLLECTION (A)  Final   Report Status 11/19/2016 FINAL  Final  Culture, Urine     Status: None   Collection Time: 11/20/16  1:28 PM  Result Value Ref Range Status   Specimen Description URINE, CATHETERIZED  Final   Special Requests Normal  Final   Culture NO GROWTH  Final   Report Status 11/21/2016 FINAL  Final  Culture, Urine     Status: Abnormal   Collection Time: 11/24/16  6:03 PM  Result Value Ref Range Status   Specimen Description URINE, CATHETERIZED  Final   Special Requests NONE  Final   Culture >=100,000 COLONIES/mL KLEBSIELLA PNEUMONIAE (A)  Final   Report Status 11/26/2016 FINAL  Final   Organism ID, Bacteria KLEBSIELLA PNEUMONIAE (A)  Final      Susceptibility   Klebsiella pneumoniae - MIC*    AMPICILLIN 16 RESISTANT Resistant     CEFAZOLIN <=4 SENSITIVE Sensitive     CEFTRIAXONE <=1 SENSITIVE Sensitive     CIPROFLOXACIN <=0.25 SENSITIVE Sensitive     GENTAMICIN <=1 SENSITIVE Sensitive     IMIPENEM <=0.25 SENSITIVE Sensitive     NITROFURANTOIN 32 SENSITIVE Sensitive     TRIMETH/SULFA <=20 SENSITIVE Sensitive     AMPICILLIN/SULBACTAM <=2 SENSITIVE Sensitive     PIP/TAZO <=4 SENSITIVE Sensitive     Extended ESBL NEGATIVE Sensitive     * >=100,000 COLONIES/mL KLEBSIELLA PNEUMONIAE  Culture, blood (Routine X 2) w Reflex to ID Panel     Status: None (Preliminary result)   Collection Time: 11/25/16  7:45 AM  Result Value Ref Range Status   Specimen Description BLOOD LEFT HAND  Final   Special Requests BOTTLES DRAWN AEROBIC AND ANAEROBIC 5CC EA  Final   Culture NO GROWTH 2 DAYS  Final   Report Status PENDING  Incomplete  Culture, blood (Routine X 2) w Reflex to ID Panel     Status: None (Preliminary result)   Collection Time: 11/25/16  7:55 AM  Result Value Ref Range Status   Specimen Description BLOOD RIGHT HAND  Final   Special Requests BOTTLES DRAWN AEROBIC AND ANAEROBIC 5CCEA  Final   Culture NO GROWTH 2 DAYS  Final  Report Status PENDING   Incomplete         Radiology Studies: No results found.      Scheduled Meds: . sodium chloride   Intravenous Once  . aspirin EC  81 mg Oral Daily  . carvedilol  6.25 mg Oral BID  . cholecalciferol  1,000 Units Oral Daily  . cloNIDine  0.2 mg Oral TID  . cyanocobalamin  1,000 mcg Subcutaneous Daily  . hydrALAZINE  25 mg Oral Q8H  . insulin aspart  0-15 Units Subcutaneous TID WC  . insulin glargine  12 Units Subcutaneous QHS  . isosorbide mononitrate  30 mg Oral Daily  . levothyroxine  50 mcg Oral QAC breakfast  . mouth rinse  15 mL Mouth Rinse BID  . pantoprazole  40 mg Oral BID  . rosuvastatin  10 mg Oral Daily  . tamsulosin  0.4 mg Oral QPC supper   Continuous Infusions:    LOS: 11 days    Time spent: 35 minutes,.     Debbe Odea, MD [facility] Pager: [email or web address] Password [email or web address] 11/28/2016, 8:25 AM

## 2016-11-28 NOTE — Progress Notes (Signed)
Physical Therapy Treatment Patient Details Name: [name] MRN: [id number] DOB: 06/12/1932 Today's Date: 11/28/2016    History of Present Illness 83 yo admitted with angina and NSTEMI. PMHx: CAD, DM, HTN, CKD, CHF, CVA, right knee surgery, NSTEMI    PT Comments    Pt in chair on arrival with breakfast untouched and reports back pain and nausea. Pt with increased ability with gait today and transfers but continues to require assist to stand. Pt with emesis EOB end of session with RN aware and denied further activity. Will continue to follow HR 78-101 sats 97% on RA  Follow Up Recommendations  SNF;Supervision for mobility/OOB;Home health PT (HHPT if pt continues to deny SNF)     Equipment Recommendations  None recommended by PT    Recommendations for Other Services       Precautions / Restrictions Precautions Precautions: Fall Restrictions Weight Bearing Restrictions: No    Mobility  Bed Mobility   Bed Mobility: Sit to Supine       Sit to supine: Supervision   General bed mobility comments: pt able to return to supine without assist with increased time, min assist to scoot to [facility]  Transfers Overall transfer level: Needs assistance   Transfers: Sit to/from Stand Sit to Stand: Min assist         General transfer comment: cues for sequence and scooting to edge, increased time with assist to rise  Ambulation/Gait Ambulation/Gait assistance: Min guard Ambulation Distance (Feet): 140 Feet Assistive device: Rolling walker (2 wheeled) Gait Pattern/deviations: Step-through pattern;Decreased stride length;Trunk flexed   Gait velocity interpretation: Below normal speed for age/gender General Gait Details: cues for posture and position in RW   Stairs            Wheelchair Mobility    Modified Rankin (Stroke Patients Only)       Balance Overall balance assessment: Needs assistance   Sitting balance-Leahy Scale: Good       Standing balance-Leahy  Scale: Fair                      Cognition Arousal/Alertness: Awake/alert Behavior During Therapy: WFL for tasks assessed/performed Overall Cognitive Status: Within Functional Limits for tasks assessed                      Exercises      General Comments        Pertinent Vitals/Pain Pain Assessment: 0-10 Pain Score: 5  Pain Location: low back Pain Descriptors / Indicators: Aching Pain Intervention(s): Limited activity within patient's tolerance;Repositioned;Monitored during session    Home Living                      Prior Function            PT Goals (current goals can now be found in the care plan section) Progress towards PT goals: Progressing toward goals    Frequency           PT Plan Current plan remains appropriate    Co-evaluation             End of Session Equipment Utilized During Treatment: Gait belt Activity Tolerance: Patient tolerated treatment well Patient left: in bed;with call bell/phone within reach     Time: 0811-0836 PT Time Calculation (min) (ACUTE ONLY): 25 min  Charges:  $Gait Training: 8-22 mins $Therapeutic Activity: 8-22 mins  G Codes:      [name] [name] 11/28/2016, 10:33 AM  [name], [name]

## 2016-11-29 DIAGNOSIS — J189 Pneumonia, unspecified organism: Secondary | ICD-10-CM

## 2016-11-29 DIAGNOSIS — R339 Retention of urine, unspecified: Secondary | ICD-10-CM

## 2016-11-29 DIAGNOSIS — I480 Paroxysmal atrial fibrillation: Secondary | ICD-10-CM

## 2016-11-29 DIAGNOSIS — N390 Urinary tract infection, site not specified: Secondary | ICD-10-CM

## 2016-11-29 DIAGNOSIS — R195 Other fecal abnormalities: Secondary | ICD-10-CM

## 2016-11-29 LAB — CBC
HEMATOCRIT: 24.5 % — AB (ref 36.0–46.0)
HEMOGLOBIN: 8.1 g/dL — AB (ref 12.0–15.0)
MCH: 29.5 pg (ref 26.0–34.0)
MCHC: 33.1 g/dL (ref 30.0–36.0)
MCV: 89.1 fL (ref 78.0–100.0)
Platelets: 203 10*3/uL (ref 150–400)
RBC: 2.75 MIL/uL — AB (ref 3.87–5.11)
RDW: 13.3 % (ref 11.5–15.5)
WBC: 3.5 10*3/uL — ABNORMAL LOW (ref 4.0–10.5)

## 2016-11-29 LAB — FOLATE RBC
FOLATE, HEMOLYSATE: 309.4 ng/mL
Folate, RBC: 1339 ng/mL (ref >498)
HEMATOCRIT: 23.1 % — AB (ref 34.0–46.6)

## 2016-11-29 LAB — BASIC METABOLIC PANEL
Anion gap: 5 (ref 5–15)
BUN: 60 mg/dL — AB (ref 6–20)
CHLORIDE: 107 mmol/L (ref 101–111)
CO2: 23 mmol/L (ref 22–32)
CREATININE: 2.98 mg/dL — AB (ref 0.44–1.00)
Calcium: 9.7 mg/dL (ref 8.9–10.3)
GFR calc Af Amer: 16 mL/min — ABNORMAL LOW (ref >60)
GFR calc non Af Amer: 14 mL/min — ABNORMAL LOW (ref >60)
Glucose, Bld: 168 mg/dL — ABNORMAL HIGH (ref 65–99)
POTASSIUM: 4.4 mmol/L (ref 3.5–5.1)
SODIUM: 135 mmol/L (ref 135–145)

## 2016-11-29 LAB — GLUCOSE, CAPILLARY
GLUCOSE-CAPILLARY: 102 mg/dL — AB (ref 65–99)
Glucose-Capillary: 186 mg/dL — ABNORMAL HIGH (ref 65–99)

## 2016-11-29 MED ORDER — CEPHALEXIN 500 MG PO CAPS: 500.0000 mg | ORAL_CAPSULE | Freq: Two times a day (BID) | ORAL | 0 refills | Status: DC

## 2016-11-29 MED ORDER — CARVEDILOL 6.25 MG PO TABS: 6.2500 mg | ORAL_TABLET | Freq: Two times a day (BID) | ORAL | 0 refills | Status: DC

## 2016-11-29 NOTE — Consult Note (Signed)
   [facility] CM Inpatient Consult   11/29/2016  Clella Mccosh Mroczkowski [birth year] [id number]    [facility] Care Management follow up. Ms. Ankrum discharged prior to visit. Noted she is active with [place] Management program. Will alert [facility] RNCM of discharge. Also noted patient will have home health as well.    Marthenia Rolling, MSN-Ed, RN,BSN [facility] Liaison [phone]

## 2016-11-29 NOTE — Care Management Note (Addendum)
Case Management Note  Patient Details  Name: [name] MRN: [id number] Date of Birth: 09-Dec-1931  Subjective/Objective:  Pt in with Chest Pain- medical manage. Pt is from home and will plan to d/c to daughters home in [place]. Pt has DME RW and will need DME 3n1. CM did call [facility] for delivery. DME to be delivered to room before d/c.                   Action/Plan: CM did discussed pt in the LLOS and per physician advisor pt may meet [place] guidelines. [name] is the [facility] agency this week. Pt given agency list and pt chose [facility] for [facility] and not [facility]. CM did call to provide referral to [facility]- Liaison checking to see if they can cover the area. CM will continue to monitor.   Expected Discharge Date:                  Expected Discharge Plan:  [place]  In-House Referral:  NA  Discharge planning Services  CM Consult  Post Acute Care Choice:  Home Health Choice offered to:  Patient, Adult Children  DME Arranged:  3-N-1 DME Agency:  Media Arranged:  RN, PT, OT, Nurse's Aide, Disease Management, Social Work [employer] Agency:  [place] (In LOS pt deemed [place]- pt refusing [place]- call plced to [facility] to see if can see pt in [place]. )  Status of Service:  Completed, signed off  If discussed at [place] of Stay Meetings, dates discussed: 11-22-16,11-24-16, 11-30-15,   Additional Comments: [address] Jacqlyn Krauss, RN,BSN [phone] CM did speak with [facility] liaison for [facility] and the pt will be [place] with [facility]. SOC to begin within 24-48 hours post d/c.  Bethena Roys, RN 11/29/2016, 11:37 AM

## 2016-11-29 NOTE — Progress Notes (Signed)
Occupational Therapy Treatment Patient Details Name: [name] MRN: [id number] DOB: 15-Feb-1932 Today's Date: 11/29/2016    History of present illness 83 yo admitted with angina and NSTEMI. PMHx: CAD, DM, HTN, CKD, CHF, CVA, right knee surgery, NSTEMI   OT comments  Pt with increased back pain, unable to tolerate EOB or OOB this visit. Performed bed level grooming. Continue to recommend post acute rehab in SNF.  Follow Up Recommendations  SNF;Supervision/Assistance - 24 hour    Equipment Recommendations       Recommendations for Other Services      Precautions / Restrictions Precautions Precautions: Fall Restrictions Weight Bearing Restrictions: No       Mobility Bed Mobility Overal bed mobility: Needs Assistance Bed Mobility: Rolling;Sidelying to Sit;Sit to Supine Rolling: Mod assist Sidelying to sit: Mod assist   Sit to supine: Mod assist   General bed mobility comments: pt limited by pain, guarding, instructed in log roll technique for comfort  Transfers                      Balance Overall balance assessment: Needs assistance Sitting-balance support: Bilateral upper extremity supported;Feet supported Sitting balance-Leahy Scale: Poor Sitting balance - Comments: supporting on B UEs due to pain                           ADL Overall ADL's : Needs assistance/impaired Eating/Feeding: Set up;Bed level Eating/Feeding Details (indicate cue type and reason): pt with poor appetite, encouraged her to eat prior to pain meds Grooming: Wash/dry hands;Wash/dry face;Brushing hair;Bed level;Minimal assistance Grooming Details (indicate cue type and reason): pt unable to tolerate sitting EOB for ADL                                      Vision                     Perception     Praxis      Cognition   Behavior During Therapy: [facility] for tasks assessed/performed Overall Cognitive Status: Impaired/Different from  baseline Area of Impairment: Memory;Safety/judgement     Memory: Decreased short-term memory    Safety/Judgement: Decreased awareness of safety;Decreased awareness of deficits     General Comments: pt continuing to think she is safe to return home although not able to get OOB this visit    Extremity/Trunk Assessment               Exercises     Shoulder Instructions       General Comments      Pertinent Vitals/ Pain       Pain Assessment: Faces Faces Pain Scale: Hurts whole lot Pain Location: low back Pain Descriptors / Indicators: Aching;Guarding;Grimacing;Moaning Pain Intervention(s): Repositioned;Patient requesting pain meds-RN notified;Monitored during session  Home Living                                          Prior Functioning/Environment              Frequency  Min 2X/week        Progress Toward Goals  OT Goals(current goals can now be found in the care plan section)  Progress towards OT goals: Not progressing toward goals - comment (pain limiting)  Acute Rehab OT Goals Patient Stated Goal: go home Time For Goal Achievement: 12/08/16 Potential to Achieve Goals: Good  Plan Discharge plan remains appropriate    Co-evaluation                 End of Session     Activity Tolerance Patient limited by pain   Patient Left in bed;with call bell/phone within reach;with family/visitor present   Nurse Communication Patient requests pain meds (ok to give crackers and peanut butter)        Time: [id number] OT Time Calculation (min): 20 min  Charges: OT General Charges $OT Visit: 1 Procedure OT Treatments $Self Care/Home Management : 8-22 mins  Malka So 11/29/2016, 9:47 AM  [phone]

## 2016-11-29 NOTE — Progress Notes (Addendum)
Patient Name: [name] Date of Encounter: 11/29/2016  Primary Cardiologist: [facility] Problem List     Principal Problem:   ACS (acute coronary syndrome) [facility]) Active Problems:   Uncontrolled hypertension   CAD S/P PCI '03 and '08   Anemia in chronic kidney disease   Insulin dependent diabetes mellitus with complications (HCC)   Chronic kidney disease, stage IV (severe) (HCC)   Acute on chronic diastolic CHF (congestive heart failure), NYHA class 4 (HCC)   Pressure injury of skin   AKI (acute kidney injury) ([place])   PAF (paroxysmal atrial fibrillation) (HCC)   Rapid atrial fibrillation (HCC)     Subjective   No complaints.  Maintaining NSR.  Eating a BOJANGLES CHICKEN BISCUIT!  Inpatient Medications    Scheduled Meds: . sodium chloride   Intravenous Once  . amLODipine  10 mg Oral Daily  . aspirin EC  81 mg Oral Daily  . carvedilol  6.25 mg Oral BID  . cholecalciferol  1,000 Units Oral Daily  . cloNIDine  0.2 mg Oral TID  . hydrALAZINE  25 mg Oral Q8H  . insulin aspart  0-15 Units Subcutaneous TID WC  . insulin glargine  12 Units Subcutaneous QHS  . isosorbide mononitrate  30 mg Oral Daily  . levothyroxine  50 mcg Oral QAC breakfast  . mouth rinse  15 mL Mouth Rinse BID  . pantoprazole  40 mg Oral BID  . rosuvastatin  10 mg Oral Daily   Continuous Infusions: . sodium chloride 75 mL/hr at 11/28/16 0920   PRN Meds: acetaminophen, ALPRAZolam, bisacodyl, gi cocktail, magnesium hydroxide, nitroGLYCERIN, ondansetron (ZOFRAN) IV, zolpidem   Vital Signs    Vitals:   11/29/16 0018 11/29/16 0024 11/29/16 0418 11/29/16 0447  BP: (!) 146/56  (!) 130/46   Pulse:  (!) 57  (!) 55  Resp:      Temp:  98.3 F (36.8 C)  97.9 F (36.6 C)  TempSrc:  Oral  Oral  SpO2:  95%  98%  Weight:    155 lb (70.3 kg)  Height:        Intake/Output Summary (Last 24 hours) at 11/29/16 0923 Last data filed at 11/29/16 0836  Gross per 24 hour  Intake             1810  ml  Output             1075 ml  Net              735 ml   Filed Weights   11/26/16 0529 11/27/16 0520 11/29/16 0447  Weight: 150 lb 9.6 oz (68.3 kg) 151 lb 9.6 oz (68.8 kg) 155 lb (70.3 kg)    Physical Exam    GEN: Ill appearing but in no acute distress.  HEENT: Grossly normal.  Neck: Supple, no JVD Cardiac: RRR, 2/6 systolic murmur over RUSB and along LSB, no rubs or gallops. No clubbing, cyanosis, edema.   Respiratory:  Diminished with crackles on left 1/3 up GI: Soft, nondistended. Skin: warm and dry, no rash. Psych: Alert, oriented.  Normal affect.  Labs    CBC  Recent Labs  11/28/16 0254 11/29/16 0456  WBC 3.4* 3.5*  HGB 8.8* 8.1*  HCT 26.1* 24.5*  MCV 87.6 89.1  PLT 209 [id number]   Basic Metabolic Panel  Recent Labs  11/27/16 0544 11/28/16 0254  NA 135 135  K 4.4 4.3  CL 103 102  CO2 24 21*  GLUCOSE 179* 146*  BUN 74* 74*  CREATININE 3.47* 3.41*  CALCIUM 9.2 9.6   Liver Function Tests No results for input(s): AST, ALT, ALKPHOS, BILITOT, PROT, ALBUMIN in the last 72 hours. No results for input(s): LIPASE, AMYLASE in the last 72 hours. Cardiac Enzymes No results for input(s): CKTOTAL, CKMB, CKMBINDEX, TROPONINI in the last 72 hours. BNP Invalid input(s): POCBNP D-Dimer No results for input(s): DDIMER in the last 72 hours. Hemoglobin A1C No results for input(s): HGBA1C in the last 72 hours. Fasting Lipid Panel No results for input(s): CHOL, HDL, LDLCALC, TRIG, CHOLHDL, LDLDIRECT in the last 72 hours. Thyroid Function Tests No results for input(s): TSH, T4TOTAL, T3FREE, THYROIDAB in the last 72 hours.  Invalid input(s): FREET3  Telemetry    NSR, ST depressions - Personally Reviewed  ECG    Sinus bradycardia with 1st degree AVB- Personally Reviewed  Radiology    No results found.  Cardiac Studies     Patient Profile     83 year-old female with history of insulin-dependent diabetes mellitus, hypertension, chronic kidney disease stage IV,  coronary artery disease with LAD balloon angioplasty in 2003 and LAD DES and OM DES in 2008. She was admitted in 2013 for an NSTEMI but declined cardiac cath and was medically managed.Patient was hospitalized about 6 weeks back  with chest pain and was ruled out for ACS but found to have accelerated hypertension. Patient returned to the ED on 12/21 with lower extremity edema or >2 weeks, increase dyspnea on exertion and woke up with chest pain symptoms. She was diaphoretic with EKG showing lateral ST depression and inverted T waves.  Patient was admitted for NSTEMI.She refused cardiac cath and was being managed medically. She was also diuresed with IV Lasix for acute on chronic dCHF. She however developed acute on chronic kidney disease, diuretics held and startedon gentle hydration. Nephrology was consulted. Hospital course also complicated with urinary retention requiring frequent in and out catheterization. Subsequently foley was placed.  Hospitalist consulted on 12/27 for low-grade fever with vaginal discharge. Found to have + UA and pneumonia and started on antibiotics, Vanc and Cefepime, for HCAP and UTI. Patient transferred to Hospitalist service on 12/29.   Assessment & Plan    1. CAD: Pt admitted with NSTEMI. The patient does not want a cardiac cath. Medical management on ASA, Coreg, nitrates, and Crestor.  2. Rapid atrial fib: Converted to NSR 11/27/16 after one dose of increased Coreg. Due to bradycardia she was dropped back to her original dose. HR now in the 50-60's.  PAF being driven by pneumonia and UTI. Not an anticoagulation candidate as per previous notes.  3. PNA: Rx per IM.  4. [id number] amlodipine 10 mg daily.  5. HLD: Continue Crestor.  6. A/C Diastolic HF: Euvolemic. Diuretics on hold due to renal insufficiency.  Creatinine trending downward.  I counseled her and her family member that she has to follow a strict low sodium diet and no Fast food.  I will get  a Nutrition consult.  7. Aortic stenosis: Mild by most recent echo.  8. UTI: Rx per IM.  No new recs at this time.  Resume low dose diuretic when renal function improves.  Will sign off, call with any questions.  Signed, Fransico Him, MD  11/29/2016, 9:23 AM

## 2016-11-29 NOTE — Progress Notes (Signed)
Attempted to stand up patient for standing weight with 2 person assist. Agreed. Upon standing, patient cried that her back hurt and her legs were going to give out. Attempted one more time but patient would not stand. Did a bed weight. Passed on to day shift nurse.

## 2016-11-29 NOTE — Progress Notes (Signed)
The client and her daughter [name] has been given discharge paper work along with follow up appointments. New medication list was given along with prescription list to pick up. Telemetry was removed and CCMD notified. The client will be discharging with her daughter via car.   Saddie Benders RN

## 2016-11-29 NOTE — Discharge Summary (Addendum)
Physician Discharge Summary  [name] [id number] DOB: 17-Nov-1932 DOA: 11/17/2016  PCP: Kenn File, MD  Admit date: 11/17/2016 Discharge date: 11/29/2016  Admitted From: home Disposition:  home   Recommendations for Outpatient Follow-up:  1. please follow Bmet in 1 wk- adjust Demedex as needed 2. Will go home with a foley cath 3. Follow up appts for PCP and Urology for next week being made by staff prior to d/c 4. cont close monitoring of Hb and stool occults as outpt  Home Health:  ordered Equipment/Devices:  walker    Discharge Condition:  stable   CODE STATUS:  DNR   Diet recommendation:  Heart healthy, low sodium, diabetic diet  Consultations:  Cardiology   Nephrology    Discharge Diagnoses:  Principal Problem:   ACS (acute coronary syndrome) ([place]) Active Problems:   PAF (paroxysmal atrial fibrillation) ([place])   Rapid atrial fibrillation ([place])   Uncontrolled hypertension   CAD S/P PCI '03 and '08   Anemia in chronic kidney disease   Insulin dependent diabetes mellitus with complications ([place])   Chronic kidney disease, stage IV (severe) ([place])   Acute on chronic diastolic CHF (congestive heart failure), NYHA class 4 ([place])   Pressure injury of skin   AKI (acute kidney injury) ([place])   Occult blood in stools   CAP (community acquired pneumonia)   UTI (urinary tract infection)   Urinary retention    Subjective: No complaints other than feeling cold. No chest pain, cough, dyspnea, nausea, vomiting.   Brief Summary: 83 year-old female with history of insulin-dependent diabetes mellitus, hypertension, chronic kidney disease stage IV coronary artery disease with LAD balloon angioplasty in 2003 and LAD DS and OM DES in 2008. She was admitted in 2013 for an NSTEMI but declined cardiac cath and was medically managed.Patient was hospitalized about 6 weeks back back with chest pain, was ruled out for ACS but found to have accelerated hypertension. Patient  returned to the ED on 12/21 with lower extremity edema or >2 weeks, increase dyspnea on exertion and woke up with chest pain symptoms. She was diaphoretic with EKG showing lateral ST depression and inverted T waves.  Patient was admitted for NSTEMI.She declined a cardiac cath and is being managed medically.  She was also diuresed with IV Lasix for acute on chronic dCHF. She however developed acute on chronic kidney disease, diuretics held and started on gentle hydration. Nephrology was consulted. Hospital course also complicated with urinary retention requiring frequent in and out catheterization. Subsequently foley was placed.  Hospitalist consulted on 12/27 for low-grade fever with vaginal discharge. Found to have + UA and pneumonia and started on antibiotics, Vanc and Cefepime, for HCAP and UTI. Patient transferred to Hospitalist service on 12/29.    Hospital Course:  ACS (acute coronary syndrome)  / CAD - TNI only mildly elevated    Ref. Range 11/17/2016 18:20 11/18/2016 00:30 11/18/2016 05:38  Troponin I Latest Ref Range: <0.03 ng/mL 0.05 (HH) 0.08 (HH) 0.08 (HH)   - has declined a cath in the past and again on this admission - Medical management per cardiology-  On ASA, coreg (started by cardiology instead of her home metoprolol) and statin.  Paroxysmal A-fib- new diagnosis discovered this admission - CHA2DS2-VASc Score 7 - Cardiology increased Coreg for rate control but became bradycardic and therefore dose dropped back - rate well controlled currently  - cardiology does not feel she is a candidate for anticoagulation - upon my discussion with her, her last fall was  over 6 months ago  - noted heme + stool-  have asked GI to evaluate as I feel she will still would be a candidate for anticoagulation if she was not heme + as she was quite independent overall prior to this admission - daughter confirms that patient is not prone to falls - see notes below regarding GI eval> will keep on  baby aspirin for now  Heme + stool - FOB +  - GI eval requested >>>they feel her heme + stool is incidental-  per GI, patient reluctant to have colonsocpy   - cont baby aspirin and cont close monitoring of Hb and stool occults as outpt   UTI - K pneumonia- sensitivities reviewed Started on cefepime and then Levaquin but developed severe nausea- will need 7 days tota- today is day 5- Levaquin held-  give Keflex for 2 more days  CAP - cough with yellow sputum for 2-3 days- crackles in LLL and LLL infiltrate on CXR - this may have been her reason for dyspnea prompting admission - Started treatment for PNA with IV vancomycin and cefepime - d/c'd Vanc- changed Cefepime to Levaquin 12/31  - following QTc - nausea since starting Levaquin preventing her from eating anyting--- d/c'd Levaquin yesterday- Q 48 hrs per renal function- next dose would be due today but she has now completed a 5 day course and has been asymptomatic for 2-3 days now  -Anemia in chronic kidney disease - Hb trending down- she was transfused one unit PRBC for  Hb 7.3 >>improved to  9.1- no overt acute bleeding - ferretin high (has been normal to high in the past) - checked folate which was normal -  B12 low normal 258- will given s/c B12 1000 mg x 3 doses- f/u as outpt - heme occult + as mentioned above  Nausea - improved after stopping Levaquin- appetite improved  Acute encephalopathy;  Suspect multifactorial- Infection, renal failure - resolved   U retention - foley placed on 12/25-   she failed a voiding trial 12/31-  Cont foley- explained to patient and daughter the need for close f/u with Urology (next week)  Acute on chronic Renal failure;  Related to diuresis and likely obstructive.  Baseline creat = 2.6- 3.1.  - renal ultrasound below showing mild R hydronephrosis with no obstruction (? If related to chronic U retention) - with hydration, Cr has improved to 3.14 and weight - as weight is steadily  rising, (69 >> 70kg) resumed diuretics today - would cont to hold ACE - follows with Dr Hinda Lenis in [place]  Acute on chronic diastolic CHF (congestive heart failure), NYHA class 4   - noted on admission by cardiology- was diuresed - has normal EF but high ventricular filling presses and mod LVH - see above regarding diuretics - ACE on hold - cont Coreg  Vaginal discharge - candida -12/25 - treated with Diflucan   Uncontrolled hypertension Stable.  Diabetes mellitus type 2 - sliding scale coverage & Lantus given in hospital   Leukopenia;  - mild - cause uncertain - follow   Discharge Instructions  Discharge Instructions    (HEART FAILURE PATIENTS) Call MD:  Anytime you have any of the following symptoms: 1) 3 pound weight gain in 24 hours or 5 pounds in 1 week 2) shortness of breath, with or without a dry hacking cough 3) swelling in the hands, feet or stomach 4) if you have to sleep on extra pillows at night in order to breathe.  Complete by:  As directed    Diet - low sodium heart healthy    Complete by:  As directed    Diet Carb Modified    Complete by:  As directed    Increase activity slowly    Complete by:  As directed      Allergies as of 11/29/2016      Reactions   Propoxyphene N-acetaminophen    Pt does not know reaction   Simvastatin Other (See Comments)   headache      Medication List    STOP taking these medications   lisinopril 40 MG tablet Commonly known as:  PRINIVIL,ZESTRIL   metoprolol succinate 25 MG 24 hr tablet Commonly known as:  TOPROL-XL     TAKE these medications   amLODipine 10 MG tablet Commonly known as:  NORVASC Take 1 tablet (10 mg total) by mouth daily.   aspirin 81 MG EC tablet Take 1 tablet (81 mg total) by mouth daily.   carvedilol 6.25 MG tablet Commonly known as:  COREG Take 1 tablet (6.25 mg total) by mouth 2 (two) times daily.   cephALEXin 500 MG capsule Commonly known as:  KEFLEX Take 1 capsule (500  mg total) by mouth 2 (two) times daily.   cholecalciferol 1000 units tablet Commonly known as:  VITAMIN D Take 1,000 Units by mouth daily.   cloNIDine 0.2 MG tablet Commonly known as:  CATAPRES Take 1 tablet (0.2 mg total) by mouth 3 (three) times daily.   diclofenac sodium 1 % Gel Commonly known as:  VOLTAREN Apply 4 g topically 4 (four) times daily.   Insulin Glargine 100 UNIT/ML Solostar Pen Commonly known as:  LANTUS Inject 20-30 Units into the skin daily at 10 pm.   isosorbide mononitrate 120 MG 24 hr tablet Commonly known as:  IMDUR TAKE 1 TABLET DAILY   levothyroxine 50 MCG tablet Commonly known as:  SYNTHROID, LEVOTHROID Take 1 tablet (50 mcg total) by mouth daily.   nitroGLYCERIN 0.4 MG SL tablet Commonly known as:  NITROSTAT Place 1 tablet (0.4 mg total) under the tongue every 5 (five) minutes x 3 doses as needed for chest pain.   rosuvastatin 10 MG tablet Commonly known as:  CRESTOR Take 1 tablet (10 mg total) by mouth daily.   torsemide 20 MG tablet Commonly known as:  DEMADEX Take 1 tablet (20 mg total) by mouth daily.            Durable Medical Equipment        Start     Ordered   11/29/16 1137  For home use only DME 3 n 1  Once     11/29/16 1136     Follow-up Information    Kenn File, MD. Go on 12/08/2016.   Specialty:  Family Medicine Why:  Hospital follow up @ 310pm Contact information: [place] [address] [place] Follow up.   Why:  3n1 Contact information: [place] [address] [phone]          Allergies  Allergen Reactions  . Propoxyphene N-Acetaminophen     Pt does not know reaction  . Simvastatin Other (See Comments)    headache     Procedures/Studies: 2 D ECHO: Left ventricle: The cavity size was normal. There was moderate   concentric hypertrophy. Systolic function was vigorous. The   estimated ejection fraction was in  the range  of 65% to 70%. Wall   motion was normal; there were no regional wall motion   abnormalities. Doppler parameters are consistent with high   ventricular filling pressure. - Aortic valve: There was mild stenosis. There was no   regurgitation. Valve area (VTI): 1.91 cm^2. Valve area (Vmax):   1.7 cm^2. Valve area (Vmean): 1.78 cm^2. - Mitral valve: Calcified annulus. The findings are consistent with   mild to moderate stenosis. There was mild regurgitation. Mean   gradient (D): 6 mm Hg. Valve area by continuity equation (using   LVOT flow): 1.9 cm^2. - Right ventricle: The cavity size was normal. Wall thickness was   normal. Systolic function was normal. - Right atrium: The atrium was moderately dilated. - Tricuspid valve: There was trivial regurgitation. - Pulmonary arteries: Systolic pressure was mildly to moderately   increased. PA peak pressure: 47 mm Hg (S).  Dg Chest 2 View  Result Date: 11/24/2016 CLINICAL DATA:  Pt said she has been running a low grade fever for the past two days. She said she does have a productive cough with yellowish phlegm. Hx of CAD, CHF, HTN, IDDM EXAM: CHEST  2 VIEW COMPARISON:  11/17/2016 FINDINGS: Cardiac silhouette is mildly enlarged. No mediastinal or hilar masses. No convincing adenopathy. Left lung base opacity noted, mildly increased from the prior study. Although this may be the atelectasis, pneumonia is suspected given the patient's history. Remainder of the lungs is clear. Possible minimal left pleural effusion. No pneumothorax. Skeletal structures are demineralized but grossly intact. IMPRESSION: 1. Left lower lobe opacity suspicious for pneumonia. Electronically Signed   By: Lajean Manes M.D.   On: 11/24/2016 18:55   Dg Chest 2 View  Result Date: 11/17/2016 CLINICAL DATA:  Chest pain with nausea and vomiting EXAM: CHEST  2 VIEW COMPARISON:  October 05, 2016 and July 03, 2016 FINDINGS: There is a rather minimal left pleural effusion. There  are scattered areas of subsegmental atelectasis in the right mid and lower lung zones. There is generalized mild interstitial thickening, stable. There may be mild chronic interstitial edema. There is no airspace consolidation. Heart is upper normal in size with pulmonary vascularity within normal limits. There is atherosclerotic calcification in the aorta. No evident bone lesions. No adenopathy. IMPRESSION: Question mild chronic congestive heart failure. Scattered areas of mild subsegmental atelectasis on the right. Aortic atherosclerosis present. No adenopathy. Electronically Signed   By: Lowella Grip III M.D.   On: 11/17/2016 09:36   US Renal  Result Date: 11/20/2016 CLINICAL DATA:  Acute renal insufficiency, diabetes and proteinuria. EXAM: RENAL / URINARY TRACT ULTRASOUND COMPLETE COMPARISON:  CT 05/14/2013 FINDINGS: Right Kidney: Length: 9.4 cm. Echogenicity within normal limits. Mild dilatation of the intrarenal collecting system. No focal mass or stones. Left Kidney: Length: 10.3 cm. Echogenicity within normal limits. No mass or hydronephrosis visualized. Three parapelvic renal cysts with the largest measuring 2.8 cm over the lower pole. Bladder: Mildly distended.  Patient unable to void. Single incidental gallstone. IMPRESSION: Mild right-sided hydronephrosis. Three parapelvic left renal cysts with the largest measuring 2.8 cm over the lower pole. Single gallstone. Electronically Signed   By: Marin Olp M.D.   On: 11/20/2016 07:31       Discharge Exam: Vitals:   11/29/16 0959 11/29/16 1135  BP: (!) 164/48 (!) 136/58  Pulse: 65 69  Resp:  18  Temp:  98 F (36.7 C)   Vitals:   11/29/16 0418 11/29/16 0447 11/29/16 0959 11/29/16 1135  BP: (!) 130/46  Marland Kitchen)  164/48 (!) 136/58  Pulse:  (!) 55 65 69  Resp:    18  Temp:  97.9 F (36.6 C)  98 F (36.7 C)  TempSrc:  Oral  Oral  SpO2:  98%  97%  Weight:  70.3 kg (155 lb)    Height:        General: Pt is alert, awake, not in acute  distress Cardiovascular: IIRR, S1/S2 +, no rubs, no gallops Respiratory: CTA bilaterally, no wheezing, no rhonchi Abdominal: Soft, NT, ND, bowel sounds + Extremities: no edema, no cyanosis    The results of significant diagnostics from this hospitalization (including imaging, microbiology, ancillary and laboratory) are listed below for reference.     Microbiology: Recent Results (from the past 240 hour(s))  Culture, Urine     Status: None   Collection Time: 11/20/16  1:28 PM  Result Value Ref Range Status   Specimen Description URINE, CATHETERIZED  Final   Special Requests Normal  Final   Culture NO GROWTH  Final   Report Status 11/21/2016 FINAL  Final  Culture, Urine     Status: Abnormal   Collection Time: 11/24/16  6:03 PM  Result Value Ref Range Status   Specimen Description URINE, CATHETERIZED  Final   Special Requests NONE  Final   Culture >=100,000 COLONIES/mL KLEBSIELLA PNEUMONIAE (A)  Final   Report Status 11/26/2016 FINAL  Final   Organism ID, Bacteria KLEBSIELLA PNEUMONIAE (A)  Final      Susceptibility   Klebsiella pneumoniae - MIC*    AMPICILLIN 16 RESISTANT Resistant     CEFAZOLIN <=4 SENSITIVE Sensitive     CEFTRIAXONE <=1 SENSITIVE Sensitive     CIPROFLOXACIN <=0.25 SENSITIVE Sensitive     GENTAMICIN <=1 SENSITIVE Sensitive     IMIPENEM <=0.25 SENSITIVE Sensitive     NITROFURANTOIN 32 SENSITIVE Sensitive     TRIMETH/SULFA <=20 SENSITIVE Sensitive     AMPICILLIN/SULBACTAM <=2 SENSITIVE Sensitive     PIP/TAZO <=4 SENSITIVE Sensitive     Extended ESBL NEGATIVE Sensitive     * >=100,000 COLONIES/mL KLEBSIELLA PNEUMONIAE  Culture, blood (Routine X 2) w Reflex to ID Panel     Status: None (Preliminary result)   Collection Time: 11/25/16  7:45 AM  Result Value Ref Range Status   Specimen Description BLOOD LEFT HAND  Final   Special Requests BOTTLES DRAWN AEROBIC AND ANAEROBIC 5CC EA  Final   Culture NO GROWTH 3 DAYS  Final   Report Status PENDING  Incomplete   Culture, blood (Routine X 2) w Reflex to ID Panel     Status: None (Preliminary result)   Collection Time: 11/25/16  7:55 AM  Result Value Ref Range Status   Specimen Description BLOOD RIGHT HAND  Final   Special Requests BOTTLES DRAWN AEROBIC AND ANAEROBIC 5CCEA  Final   Culture NO GROWTH 3 DAYS  Final   Report Status PENDING  Incomplete     Labs: BNP (last 3 results)  Recent Labs  10/05/16 1052 11/17/16 0900  BNP 411.8* [id number]*   Basic Metabolic Panel:  Recent Labs Lab 11/25/16 0743 11/26/16 0427 11/27/16 0544 11/28/16 0254 11/29/16 0909  NA 134* 133* 135 135 135  K 4.3 4.5 4.4 4.3 4.4  CL 101 99* 103 102 107  CO2 21* 23 24 21* 23  GLUCOSE 202* 257* 179* 146* 168*  BUN 84* 85* 74* 74* 60*  CREATININE 4.08* 3.75* 3.47* 3.41* 2.98*  CALCIUM 9.2 9.3 9.2 9.6 9.7   Liver Function Tests: No  results for input(s): AST, ALT, ALKPHOS, BILITOT, PROT, ALBUMIN in the last 168 hours. No results for input(s): LIPASE, AMYLASE in the last 168 hours. No results for input(s): AMMONIA in the last 168 hours. CBC:  Recent Labs Lab 11/25/16 0503 11/26/16 0418 11/27/16 0544 11/28/16 0254 11/29/16 0456  WBC 2.3* 4.1 3.4* 3.4* 3.5*  HGB 7.3* 9.1* 8.5* 8.8* 8.1*  HCT 21.8* 26.8* 25.5* 26.1* 24.5*  MCV 89.7 87.9 88.5 87.6 89.1  PLT 155 160 170 209 203   Cardiac Enzymes: No results for input(s): CKTOTAL, CKMB, CKMBINDEX, TROPONINI in the last 168 hours. BNP: Invalid input(s): POCBNP CBG:  Recent Labs Lab 11/28/16 1121 11/28/16 1618 11/28/16 2016 11/29/16 0737 11/29/16 1134  GLUCAP 134* 145* 152* 102* 186*   D-Dimer No results for input(s): DDIMER in the last 72 hours. Hgb A1c No results for input(s): HGBA1C in the last 72 hours. Lipid Profile No results for input(s): CHOL, HDL, LDLCALC, TRIG, CHOLHDL, LDLDIRECT in the last 72 hours. Thyroid function studies No results for input(s): TSH, T4TOTAL, T3FREE, THYROIDAB in the last 72 hours.  Invalid input(s):  FREET3 Anemia work up  Recent Labs  11/27/16 0544  FOLATE 13.7   Urinalysis    Component Value Date/Time   COLORURINE YELLOW 11/24/2016 1803   APPEARANCEUR CLOUDY (A) 11/24/2016 1803   LABSPEC 1.012 11/24/2016 1803   PHURINE 6.0 11/24/2016 1803   GLUCOSEU NEGATIVE 11/24/2016 1803   HGBUR NEGATIVE 11/24/2016 1803   BILIRUBINUR NEGATIVE 11/24/2016 1803   BILIRUBINUR neg 07/02/2015 1505   KETONESUR NEGATIVE 11/24/2016 1803   PROTEINUR 100 (A) 11/24/2016 1803   UROBILINOGEN negative 07/02/2015 1505   UROBILINOGEN 0.2 05/10/2015 1245   NITRITE NEGATIVE 11/24/2016 1803   LEUKOCYTESUR LARGE (A) 11/24/2016 1803   Sepsis Labs Invalid input(s): PROCALCITONIN,  WBC,  LACTICIDVEN Microbiology Recent Results (from the past 240 hour(s))  Culture, Urine     Status: None   Collection Time: 11/20/16  1:28 PM  Result Value Ref Range Status   Specimen Description URINE, CATHETERIZED  Final   Special Requests Normal  Final   Culture NO GROWTH  Final   Report Status 11/21/2016 FINAL  Final  Culture, Urine     Status: Abnormal   Collection Time: 11/24/16  6:03 PM  Result Value Ref Range Status   Specimen Description URINE, CATHETERIZED  Final   Special Requests NONE  Final   Culture >=100,000 COLONIES/mL KLEBSIELLA PNEUMONIAE (A)  Final   Report Status 11/26/2016 FINAL  Final   Organism ID, Bacteria KLEBSIELLA PNEUMONIAE (A)  Final      Susceptibility   Klebsiella pneumoniae - MIC*    AMPICILLIN 16 RESISTANT Resistant     CEFAZOLIN <=4 SENSITIVE Sensitive     CEFTRIAXONE <=1 SENSITIVE Sensitive     CIPROFLOXACIN <=0.25 SENSITIVE Sensitive     GENTAMICIN <=1 SENSITIVE Sensitive     IMIPENEM <=0.25 SENSITIVE Sensitive     NITROFURANTOIN 32 SENSITIVE Sensitive     TRIMETH/SULFA <=20 SENSITIVE Sensitive     AMPICILLIN/SULBACTAM <=2 SENSITIVE Sensitive     PIP/TAZO <=4 SENSITIVE Sensitive     Extended ESBL NEGATIVE Sensitive     * >=100,000 COLONIES/mL KLEBSIELLA PNEUMONIAE  Culture,  blood (Routine X 2) w Reflex to ID Panel     Status: None (Preliminary result)   Collection Time: 11/25/16  7:45 AM  Result Value Ref Range Status   Specimen Description BLOOD LEFT HAND  Final   Special Requests BOTTLES DRAWN AEROBIC AND ANAEROBIC 5CC EA  Final   Culture NO GROWTH 3 DAYS  Final   Report Status PENDING  Incomplete  Culture, blood (Routine X 2) w Reflex to ID Panel     Status: None (Preliminary result)   Collection Time: 11/25/16  7:55 AM  Result Value Ref Range Status   Specimen Description BLOOD RIGHT HAND  Final   Special Requests BOTTLES DRAWN AEROBIC AND ANAEROBIC 5CCEA  Final   Culture NO GROWTH 3 DAYS  Final   Report Status PENDING  Incomplete     Time coordinating discharge: Over 30 minutes  SIGNED:   Debbe Odea, MD  [facility] 11/29/2016, 11:55 AM Pager   If 7PM-7AM, please contact night-coverage [email or web address] Password [email or web address]

## 2016-11-29 NOTE — Care Management Important Message (Signed)
Important Message  Patient Details  Name: [name] MRN: [id number] Date of Birth: [birth year]   Medicare Important Message Given:  Yes    Hana Trippett Abena 11/29/2016, 2:45 PM

## 2016-11-30 ENCOUNTER — Ambulatory Visit: Payer: Self-pay | Admitting: *Deleted

## 2016-11-30 ENCOUNTER — Other Ambulatory Visit: Payer: Self-pay | Admitting: *Deleted

## 2016-11-30 DIAGNOSIS — N184 Chronic kidney disease, stage 4 (severe): Secondary | ICD-10-CM | POA: Diagnosis not present

## 2016-11-30 DIAGNOSIS — D638 Anemia in other chronic diseases classified elsewhere: Secondary | ICD-10-CM | POA: Diagnosis not present

## 2016-11-30 DIAGNOSIS — E1122 Type 2 diabetes mellitus with diabetic chronic kidney disease: Secondary | ICD-10-CM | POA: Diagnosis not present

## 2016-11-30 DIAGNOSIS — Z7982 Long term (current) use of aspirin: Secondary | ICD-10-CM | POA: Diagnosis not present

## 2016-11-30 DIAGNOSIS — I5033 Acute on chronic diastolic (congestive) heart failure: Secondary | ICD-10-CM | POA: Diagnosis not present

## 2016-11-30 DIAGNOSIS — Z791 Long term (current) use of non-steroidal anti-inflammatories (NSAID): Secondary | ICD-10-CM | POA: Diagnosis not present

## 2016-11-30 DIAGNOSIS — Z794 Long term (current) use of insulin: Secondary | ICD-10-CM | POA: Diagnosis not present

## 2016-11-30 DIAGNOSIS — I130 Hypertensive heart and chronic kidney disease with heart failure and stage 1 through stage 4 chronic kidney disease, or unspecified chronic kidney disease: Secondary | ICD-10-CM | POA: Diagnosis not present

## 2016-11-30 DIAGNOSIS — I2510 Atherosclerotic heart disease of native coronary artery without angina pectoris: Secondary | ICD-10-CM | POA: Diagnosis not present

## 2016-11-30 DIAGNOSIS — I249 Acute ischemic heart disease, unspecified: Secondary | ICD-10-CM | POA: Diagnosis not present

## 2016-11-30 LAB — CULTURE, BLOOD (ROUTINE X 2)
CULTURE: NO GROWTH
Culture: NO GROWTH

## 2016-11-30 NOTE — Patient Outreach (Signed)
Telephone call to patient for transition of care week 1, pt hospitalized 12-21/17 through 11/29/16 with NSTEMI, spoke with pt and patient's daughter [name] who reports pt is staying with her at present at address [address], [place], [place] [address] and not sure when pt will go back to her home, reports pt to see primary care MD tomorrow and daughter is aware that pt needs BMet and that demadex may be adjusted.  Pt has foley catheter, daughter and home health RN are in process of obtaining appointment with [facility] and had to leave a message, daughter reports she is emptying catheter and home health is aware of the catheter.  RN CM talked with daughter about close monitoring of pt hemoglobin and stool occult tests, daughter verbalizes understanding.  RN CM reviewed CHF action plan, daughter is getting scales, blood pressure cuff and other items from patient's home today and states pt will be weighing daily, daughter is CNA and states she knows how to check blood pressure, respirations, etc, also reports aware kidney function has declined and her mother is DNR and does not want dialysis or any heroic measures.  RN CM faxed today's note to primary MD Dr. [name].  Patient was recently discharged from hospital and all medications have been reviewed.  [facility] CM Care Plan Problem One   Flowsheet Row Most Recent Value  Care Plan Problem One  HIgh risk for readmission related to disease process/ CHF  Role Documenting the Problem One  Care Management Coordinator  Care Plan for Problem One  Active  [facility] Long Term Goal (31-90 days)  Pt will verbalize better understanding of disease process CHF to facilitate better outcomes and avoid hospital readmission within 90 days.  [facility] Long Term Goal Start Date  11/30/16 [name] restarted- pt hospitalized]  Interventions for Problem One Long Term Goal  RN CM reiterated with daughter importance of daily weights with [employer] telemonitoring, importance of  attending all follow up MD appointments.  [facility] CM Short Term Goal #1 (0-30 days)  Pt will not readmit to hospital for HF in the next 30 days.  [facility] CM Short Term Goal #1 Start Date  11/30/16 [name] restarted- pt hospitalized ]  Interventions for Short Term Goal #1  RN CM reviewed CHF zones/ action plan  [facility] CM Short Term Goal #2 (0-30 days)  Pt will weigh daily and record within 30 days.  [facility] CM Short Term Goal #2 Start Date  11/24/16 [goal restarted]  Interventions for Short Term Goal #2  RN CM reiterated importance of weighing every day and keeping log, pt states she will start keeping log., this goal restarted     PLAN Follow up with home visit next week Continue weekly transition of care  Jacqlyn Larsen [facility] [place], [place] Coordinator [phone]

## 2016-11-30 NOTE — Progress Notes (Signed)
Patient discharged home on 11/29/16 with Foley cath for urinary retention.  Followup appointment with [facility], Dr. Diona Fanti on 12/06/2016 at 3pm in the [place] office.  I spoke with patient's daughter,[name], and updated her on the new appointment and to be looking for packet of information in the mail from the Urologist office for patient/family to fill out to bring to appointment.  She stated her understanding.  Sanda Linger

## 2016-12-01 ENCOUNTER — Ambulatory Visit: Payer: Medicare Other | Admitting: Family Medicine

## 2016-12-01 DIAGNOSIS — Z794 Long term (current) use of insulin: Secondary | ICD-10-CM | POA: Diagnosis not present

## 2016-12-01 DIAGNOSIS — I130 Hypertensive heart and chronic kidney disease with heart failure and stage 1 through stage 4 chronic kidney disease, or unspecified chronic kidney disease: Secondary | ICD-10-CM | POA: Diagnosis not present

## 2016-12-01 DIAGNOSIS — I2510 Atherosclerotic heart disease of native coronary artery without angina pectoris: Secondary | ICD-10-CM | POA: Diagnosis not present

## 2016-12-01 DIAGNOSIS — N184 Chronic kidney disease, stage 4 (severe): Secondary | ICD-10-CM | POA: Diagnosis not present

## 2016-12-01 DIAGNOSIS — Z791 Long term (current) use of non-steroidal anti-inflammatories (NSAID): Secondary | ICD-10-CM | POA: Diagnosis not present

## 2016-12-01 DIAGNOSIS — I5033 Acute on chronic diastolic (congestive) heart failure: Secondary | ICD-10-CM | POA: Diagnosis not present

## 2016-12-01 DIAGNOSIS — Z7982 Long term (current) use of aspirin: Secondary | ICD-10-CM | POA: Diagnosis not present

## 2016-12-01 DIAGNOSIS — I249 Acute ischemic heart disease, unspecified: Secondary | ICD-10-CM | POA: Diagnosis not present

## 2016-12-01 DIAGNOSIS — E1122 Type 2 diabetes mellitus with diabetic chronic kidney disease: Secondary | ICD-10-CM | POA: Diagnosis not present

## 2016-12-01 DIAGNOSIS — D638 Anemia in other chronic diseases classified elsewhere: Secondary | ICD-10-CM | POA: Diagnosis not present

## 2016-12-02 DIAGNOSIS — D638 Anemia in other chronic diseases classified elsewhere: Secondary | ICD-10-CM | POA: Diagnosis not present

## 2016-12-02 DIAGNOSIS — I2510 Atherosclerotic heart disease of native coronary artery without angina pectoris: Secondary | ICD-10-CM | POA: Diagnosis not present

## 2016-12-02 DIAGNOSIS — Z791 Long term (current) use of non-steroidal anti-inflammatories (NSAID): Secondary | ICD-10-CM | POA: Diagnosis not present

## 2016-12-02 DIAGNOSIS — Z7982 Long term (current) use of aspirin: Secondary | ICD-10-CM | POA: Diagnosis not present

## 2016-12-02 DIAGNOSIS — Z794 Long term (current) use of insulin: Secondary | ICD-10-CM | POA: Diagnosis not present

## 2016-12-02 DIAGNOSIS — I130 Hypertensive heart and chronic kidney disease with heart failure and stage 1 through stage 4 chronic kidney disease, or unspecified chronic kidney disease: Secondary | ICD-10-CM | POA: Diagnosis not present

## 2016-12-02 DIAGNOSIS — I249 Acute ischemic heart disease, unspecified: Secondary | ICD-10-CM | POA: Diagnosis not present

## 2016-12-02 DIAGNOSIS — N184 Chronic kidney disease, stage 4 (severe): Secondary | ICD-10-CM | POA: Diagnosis not present

## 2016-12-02 DIAGNOSIS — E1122 Type 2 diabetes mellitus with diabetic chronic kidney disease: Secondary | ICD-10-CM | POA: Diagnosis not present

## 2016-12-02 DIAGNOSIS — I5033 Acute on chronic diastolic (congestive) heart failure: Secondary | ICD-10-CM | POA: Diagnosis not present

## 2016-12-05 ENCOUNTER — Ambulatory Visit: Payer: Medicare Other | Admitting: Family Medicine

## 2016-12-05 DIAGNOSIS — Z794 Long term (current) use of insulin: Secondary | ICD-10-CM | POA: Diagnosis not present

## 2016-12-05 DIAGNOSIS — N184 Chronic kidney disease, stage 4 (severe): Secondary | ICD-10-CM | POA: Diagnosis not present

## 2016-12-05 DIAGNOSIS — D638 Anemia in other chronic diseases classified elsewhere: Secondary | ICD-10-CM | POA: Diagnosis not present

## 2016-12-05 DIAGNOSIS — D649 Anemia, unspecified: Secondary | ICD-10-CM | POA: Diagnosis not present

## 2016-12-05 DIAGNOSIS — I249 Acute ischemic heart disease, unspecified: Secondary | ICD-10-CM | POA: Diagnosis not present

## 2016-12-05 DIAGNOSIS — Z7982 Long term (current) use of aspirin: Secondary | ICD-10-CM | POA: Diagnosis not present

## 2016-12-05 DIAGNOSIS — I2510 Atherosclerotic heart disease of native coronary artery without angina pectoris: Secondary | ICD-10-CM | POA: Diagnosis not present

## 2016-12-05 DIAGNOSIS — N390 Urinary tract infection, site not specified: Secondary | ICD-10-CM | POA: Diagnosis not present

## 2016-12-05 DIAGNOSIS — Z791 Long term (current) use of non-steroidal anti-inflammatories (NSAID): Secondary | ICD-10-CM | POA: Diagnosis not present

## 2016-12-05 DIAGNOSIS — I129 Hypertensive chronic kidney disease with stage 1 through stage 4 chronic kidney disease, or unspecified chronic kidney disease: Secondary | ICD-10-CM | POA: Diagnosis not present

## 2016-12-05 DIAGNOSIS — I5033 Acute on chronic diastolic (congestive) heart failure: Secondary | ICD-10-CM | POA: Diagnosis not present

## 2016-12-05 DIAGNOSIS — I509 Heart failure, unspecified: Secondary | ICD-10-CM | POA: Diagnosis not present

## 2016-12-05 DIAGNOSIS — E1122 Type 2 diabetes mellitus with diabetic chronic kidney disease: Secondary | ICD-10-CM | POA: Diagnosis not present

## 2016-12-05 DIAGNOSIS — I130 Hypertensive heart and chronic kidney disease with heart failure and stage 1 through stage 4 chronic kidney disease, or unspecified chronic kidney disease: Secondary | ICD-10-CM | POA: Diagnosis not present

## 2016-12-05 DIAGNOSIS — N189 Chronic kidney disease, unspecified: Secondary | ICD-10-CM | POA: Diagnosis not present

## 2016-12-06 DIAGNOSIS — E1122 Type 2 diabetes mellitus with diabetic chronic kidney disease: Secondary | ICD-10-CM | POA: Diagnosis not present

## 2016-12-06 DIAGNOSIS — I5033 Acute on chronic diastolic (congestive) heart failure: Secondary | ICD-10-CM | POA: Diagnosis not present

## 2016-12-06 DIAGNOSIS — I2510 Atherosclerotic heart disease of native coronary artery without angina pectoris: Secondary | ICD-10-CM | POA: Diagnosis not present

## 2016-12-06 DIAGNOSIS — I249 Acute ischemic heart disease, unspecified: Secondary | ICD-10-CM | POA: Diagnosis not present

## 2016-12-06 DIAGNOSIS — Z7982 Long term (current) use of aspirin: Secondary | ICD-10-CM | POA: Diagnosis not present

## 2016-12-06 DIAGNOSIS — D638 Anemia in other chronic diseases classified elsewhere: Secondary | ICD-10-CM | POA: Diagnosis not present

## 2016-12-06 DIAGNOSIS — I130 Hypertensive heart and chronic kidney disease with heart failure and stage 1 through stage 4 chronic kidney disease, or unspecified chronic kidney disease: Secondary | ICD-10-CM | POA: Diagnosis not present

## 2016-12-06 DIAGNOSIS — N184 Chronic kidney disease, stage 4 (severe): Secondary | ICD-10-CM | POA: Diagnosis not present

## 2016-12-06 DIAGNOSIS — Z794 Long term (current) use of insulin: Secondary | ICD-10-CM | POA: Diagnosis not present

## 2016-12-06 DIAGNOSIS — Z791 Long term (current) use of non-steroidal anti-inflammatories (NSAID): Secondary | ICD-10-CM | POA: Diagnosis not present

## 2016-12-07 ENCOUNTER — Telehealth: Payer: Self-pay | Admitting: Student

## 2016-12-07 ENCOUNTER — Encounter: Payer: Self-pay | Admitting: *Deleted

## 2016-12-07 ENCOUNTER — Other Ambulatory Visit: Payer: Self-pay | Admitting: *Deleted

## 2016-12-07 DIAGNOSIS — I509 Heart failure, unspecified: Secondary | ICD-10-CM | POA: Diagnosis not present

## 2016-12-07 DIAGNOSIS — N184 Chronic kidney disease, stage 4 (severe): Secondary | ICD-10-CM | POA: Diagnosis not present

## 2016-12-07 DIAGNOSIS — N250 Renal osteodystrophy: Secondary | ICD-10-CM | POA: Diagnosis not present

## 2016-12-07 DIAGNOSIS — R809 Proteinuria, unspecified: Secondary | ICD-10-CM | POA: Diagnosis not present

## 2016-12-07 NOTE — Telephone Encounter (Signed)
New message    Almyra Free is a Tourist information centre manager and she is calling for pt  Pt c/o swelling: STAT is pt has developed SOB within 24 hours  1. How long have you been experiencing swelling? week  2. Where is the swelling located? legs  3.  Are you currently taking a "fluid pill"? yes  4.  Are you currently SOB? no 5.  Have you traveled recently? no    1/5 she was 89 and now 89  3 plus edema  [phone] pt daughter veranda

## 2016-12-07 NOTE — Patient Outreach (Signed)
[name]) Care Management   12/07/2016  [name] Jun 30, 1932 [id number]  [name] is an 83 y.o. female  Subjective: Routine home visit/ Transition of care week 2, pt at daughter's home, daughter [name] present for visit, HIPAA verified, pt reports she has " fluid gathering in my feet"  Daughter reports pt not able to weigh daily recently due to weakness, has gained weight in one week, weight on 12/02/16 was 145 pounds, weight on 12/06/16 and 12/07/16 154 pounds.  Daughter states blood sugar "has been good reading 121 today"  Pt to see nephrology Dr. [name] today and to see urologist Dr. [name] next Tuesday for follow up with foley catheter, to see primary care Dr. [name] tomorrow and cardiologist Dr. Percival Spanish next week 12/13/16.  Objective:   Vitals:   12/07/16 1030  BP: 118/62  Pulse: 62  Resp: 16  SpO2: 97%  Weight: 154 lb (69.9 kg)  CBG 121 today ROS  Physical Exam  Constitutional: She is oriented to person, place, and time. She appears well-developed and well-nourished.  HENT:  Head: Normocephalic.  Neck: Normal range of motion. Neck supple.  Cardiovascular: Normal rate.   Respiratory: Effort normal and breath sounds normal.  GI: Soft. Bowel sounds are normal.  Genitourinary:  Genitourinary Comments: Foley catheter patent and draining clear, yellow urine.  Musculoskeletal: Normal range of motion. She exhibits edema.  3+ edema lower extremities bil from feet to knees  Neurological: She is alert and oriented to person, place, and time.  Forgetful at times  Skin: Skin is warm and dry.  Psychiatric: She has a normal mood and affect. Her behavior is normal.    Encounter Medications:   Outpatient Encounter Prescriptions as of 12/07/2016  Medication Sig  . amLODipine (NORVASC) 10 MG tablet Take 1 tablet (10 mg total) by mouth daily.  Marland Kitchen aspirin EC 81 MG EC tablet Take 1 tablet (81 mg total) by mouth daily.  . carvedilol (COREG) 6.25 MG tablet  Take 1 tablet (6.25 mg total) by mouth 2 (two) times daily.  . cholecalciferol (VITAMIN D) 1000 units tablet Take 1,000 Units by mouth daily.  . cloNIDine (CATAPRES) 0.2 MG tablet Take 1 tablet (0.2 mg total) by mouth 3 (three) times daily.  . diclofenac sodium (VOLTAREN) 1 % GEL Apply 4 g topically 4 (four) times daily.  . Insulin Glargine (LANTUS) 100 UNIT/ML Solostar Pen Inject 20-30 Units into the skin daily at 10 pm.  . isosorbide mononitrate (IMDUR) 120 MG 24 hr tablet TAKE 1 TABLET DAILY  . levothyroxine (SYNTHROID, LEVOTHROID) 50 MCG tablet Take 1 tablet (50 mcg total) by mouth daily.  . nitroGLYCERIN (NITROSTAT) 0.4 MG SL tablet Place 1 tablet (0.4 mg total) under the tongue every 5 (five) minutes x 3 doses as needed for chest pain.  . rosuvastatin (CRESTOR) 10 MG tablet Take 1 tablet (10 mg total) by mouth daily.  Marland Kitchen torsemide (DEMADEX) 20 MG tablet Take 1 tablet (20 mg total) by mouth daily.  . [DISCONTINUED] cephALEXin (KEFLEX) 500 MG capsule Take 1 capsule (500 mg total) by mouth 2 (two) times daily.   No facility-administered encounter medications on file as of 12/07/2016.     Functional Status:   In your present state of health, do you have any difficulty performing the following activities: 10/25/2016 10/06/2016  Hearing? N -  Vision? N -  Difficulty concentrating or making decisions? Y -  Walking or climbing stairs? Y -  Dressing or bathing? N -  Doing errands, shopping? Y N  Preparing Food and eating ? N -  Using the Toilet? N -  In the past six months, have you accidently leaked urine? N -  Do you have problems with loss of bowel control? N -  Managing your Medications? Y -  Managing your Finances? N -  Housekeeping or managing your Housekeeping? Y -  Some recent data might be hidden    Fall/Depression Screening:    PHQ 2/9 Scores 11/08/2016 10/25/2016 10/17/2016 08/22/2016 08/10/2016 07/29/2016 07/20/2016  PHQ - 2 Score 0 0 0 0 0 0 0  PHQ- 9 Score - - - - - - -     Assessment:  RN CM observed all medication bottles and reviewed with patient's daughter [name]. Daughter ask about hospice, RN CM to include in note to primary MD about referral.  See care plan- RN CM called Dr. [name] office, spoke with [name] and reported weight gain, edema.   Patient's daughter called back after nephrology visit and states MD increased torsemide to 2 tablets daily.  Rn CM called [facility] and reported to [place]- weight gain, edema and MD increased torsemide to 2 tablets daily. Home health continues to see pt 3 times weekly. RN CM faxed note to Dr. [name] office reported edema, weight gain and nephrologist increasing torsemide, sent in basket. RN CM faxed note to primary MD Dr. [name] (pt will see on 12/08/16) with update on edema, weight gain, medication change, ask if hospice referral appropriate for pt and to discuss with family at tomorrow's visit.  [facility] CM Care Plan Problem One   Flowsheet Row Most Recent Value  Care Plan Problem One  HIgh risk for readmission related to disease process/ CHF  Role Documenting the Problem One  Care Management Coordinator  Care Plan for Problem One  Active  THN Long Term Goal (31-90 days)  Pt will verbalize better understanding of disease process CHF to facilitate better outcomes and avoid hospital readmission within 90 days.  THN Long Term Goal Start Date  11/30/16 [name] restarted- pt hospitalized]  Interventions for Problem One Long Term Goal  RN CM reinforced with daughter importance of daily weights with [employer] telemonitoring, importance of attending all follow up MD appointments., RN CM called Dr. [name] office, spoke with [name], reported 9 pound weight gain in 5 days, called [place], spoke with [place] and reported 9 pound weight gain and that nephrologist increased torsemide to 2 tablets daily  THN CM Short Term Goal #1 (0-30 days)  Pt will not readmit to  hospital for HF in the next 30 days.  THN CM Short Term Goal #1 Start Date  11/30/16 [name] restarted- pt hospitalized ]  Interventions for Short Term Goal #1  RN CM reviewed / reinforced CHF zones/ action plan and which provider to call  [facility] CM Short Term Goal #2 (0-30 days)  Pt will weigh daily and record within 30 days.  THN CM Short Term Goal #2 Start Date  11/24/16 [goal restarted]  Interventions for Short Term Goal #2  RN CM reviewed with daughter importance of weighing daily and adhering to action plan       Plan: continue weekly transition of care calls See pt for home visit February Assess weight, CBG, symptom management  Jacqlyn Larsen [facility], [place] Coordinator [phone]

## 2016-12-07 NOTE — Telephone Encounter (Signed)
This was from earlier call - was accidentally marked as "complete" and msg retrieved.  I was unable to reach patient when dialed. There is no answer or voice mail pickup. Her DPR indicates that I am not able to speak to family members regarding her PHI so I have not contacted daughter as suggested.  I did call Jacqlyn Larsen w [facility] back, left detailed msg regarding reaching me directly. Can get patient in for earlier appt if need be, though if assessment indicates problems such as SOB at rest, she may be better assessed in ER.

## 2016-12-07 NOTE — Telephone Encounter (Signed)
Attempt to call patient-no answer, asked to enter "remote access code".    Per chart review (Patient outreach note)-patient was seen by nephrologist today, torsemide was increased to BID.  Note faxed to office by CM.

## 2016-12-08 ENCOUNTER — Encounter: Payer: Self-pay | Admitting: Family Medicine

## 2016-12-08 ENCOUNTER — Ambulatory Visit (INDEPENDENT_AMBULATORY_CARE_PROVIDER_SITE_OTHER): Payer: Medicare Other | Admitting: Family Medicine

## 2016-12-08 VITALS — BP 139/53 | HR 58 | Temp 97.1°F | Ht 64.0 in | Wt 144.0 lb

## 2016-12-08 DIAGNOSIS — Z9861 Coronary angioplasty status: Secondary | ICD-10-CM

## 2016-12-08 DIAGNOSIS — Z7982 Long term (current) use of aspirin: Secondary | ICD-10-CM | POA: Diagnosis not present

## 2016-12-08 DIAGNOSIS — I2510 Atherosclerotic heart disease of native coronary artery without angina pectoris: Secondary | ICD-10-CM

## 2016-12-08 DIAGNOSIS — N184 Chronic kidney disease, stage 4 (severe): Secondary | ICD-10-CM | POA: Diagnosis not present

## 2016-12-08 DIAGNOSIS — I249 Acute ischemic heart disease, unspecified: Secondary | ICD-10-CM | POA: Diagnosis not present

## 2016-12-08 DIAGNOSIS — E1122 Type 2 diabetes mellitus with diabetic chronic kidney disease: Secondary | ICD-10-CM | POA: Diagnosis not present

## 2016-12-08 DIAGNOSIS — I5033 Acute on chronic diastolic (congestive) heart failure: Secondary | ICD-10-CM | POA: Diagnosis not present

## 2016-12-08 DIAGNOSIS — I130 Hypertensive heart and chronic kidney disease with heart failure and stage 1 through stage 4 chronic kidney disease, or unspecified chronic kidney disease: Secondary | ICD-10-CM | POA: Diagnosis not present

## 2016-12-08 DIAGNOSIS — D638 Anemia in other chronic diseases classified elsewhere: Secondary | ICD-10-CM | POA: Diagnosis not present

## 2016-12-08 DIAGNOSIS — Z791 Long term (current) use of non-steroidal anti-inflammatories (NSAID): Secondary | ICD-10-CM | POA: Diagnosis not present

## 2016-12-08 DIAGNOSIS — Z794 Long term (current) use of insulin: Secondary | ICD-10-CM | POA: Diagnosis not present

## 2016-12-08 MED ORDER — HYDROCODONE-ACETAMINOPHEN 5-325 MG PO TABS: 1.0000 | ORAL_TABLET | Freq: Four times a day (QID) | ORAL | 0 refills | Status: DC | PRN

## 2016-12-08 NOTE — Progress Notes (Signed)
   HPI  Patient presents today here for hospital follow-up.  He was admitted on December 21 with acute on chronic diastolic CHF and NSTEMI. She was later found to have developed pneumonia, UTI, and acute on chronic renal failure due to diuresis.  Patient is been very clear about her desires, she does not want hemodialysis, she does not want any cardiology intervention. She's been treated medically up to this point. She's had good diuresis with torsemide and has had recent follow-up with her nephrologist.  Patient today complains of severe back pain.   Daughter states that she has not been falling. She would like a hospital bed. 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 Her mother states that she does not want dialysis or heart catheterizations. She does not want to go to inpatient hospice but is open to home health hospice.  PMH: Smoking status noted ROS: Per HPI  Objective: BP (!) 139/53   Pulse (!) 58   Temp 97.1 F (36.2 C) (Oral)   Ht '5\' 4"'$  (1.626 m)   Wt 144 lb (65.3 kg)   BMI 24.72 kg/m  Gen: NAD, alert, cooperative with exam HEENT: NCAT CV: RRR, good S1/S2, no murmur Resp: Clear, nonlabored, no crackles appreciated Abd: SNTND, BS present, no guarding or organomegaly Ext: 1-2+ pitting edema bilateral lower extremities in bilateral upper extremities Neuro: Alert and oriented, No gross deficits  Assessment and plan:  Lengthy discussion about goals of care today, I have sent a referral to hospice.   # CAD, S/p PCI 2003 and 2008 No chest pain today Recently with NSTEMI X 2 along with hypertensive emergency Pt does not want intervention, medicall manaed with BB, imdur, nitro, ASA  # Acute on Chronic dCHF Improving volume controlled EF Appears to be diuresisng well currently, she reports improvement after torsemide adjustment by Dr. Leane Para.  Continue BID torsemide  Weight unlikely to be accurate today, pt had difficult letting go of walker on scale.    # CKD stage 4 Woresening renal function Balanccing renal function and duresis Had acute urinary retention while IP, Has uro f/u  # HTN Controlled With recent hypertensive emergency  Back pain Acute on chronic, Small amount of hydrocodone given.  Discussed careful observation about risk of falls.  Plain film in Sept with L3/L4 malalignment 2/2 spondylosis.     Orders Placed This Encounter  Procedures  . BMP8+EGFR    Meds ordered this encounter  Medications  . HYDROcodone-acetaminophen (NORCO) 5-325 MG tablet    Sig: Take 1 tablet by mouth every 6 (six) hours as needed for moderate pain.    Dispense:  30 tablet    Refill:  0    Laroy Apple, MD [facility] 12/08/2016, 4:06 PM

## 2016-12-09 DIAGNOSIS — Z791 Long term (current) use of non-steroidal anti-inflammatories (NSAID): Secondary | ICD-10-CM | POA: Diagnosis not present

## 2016-12-09 DIAGNOSIS — Z7982 Long term (current) use of aspirin: Secondary | ICD-10-CM | POA: Diagnosis not present

## 2016-12-09 DIAGNOSIS — I5033 Acute on chronic diastolic (congestive) heart failure: Secondary | ICD-10-CM | POA: Diagnosis not present

## 2016-12-09 DIAGNOSIS — Z794 Long term (current) use of insulin: Secondary | ICD-10-CM | POA: Diagnosis not present

## 2016-12-09 DIAGNOSIS — I249 Acute ischemic heart disease, unspecified: Secondary | ICD-10-CM | POA: Diagnosis not present

## 2016-12-09 DIAGNOSIS — E1122 Type 2 diabetes mellitus with diabetic chronic kidney disease: Secondary | ICD-10-CM | POA: Diagnosis not present

## 2016-12-09 DIAGNOSIS — N184 Chronic kidney disease, stage 4 (severe): Secondary | ICD-10-CM | POA: Diagnosis not present

## 2016-12-09 DIAGNOSIS — I130 Hypertensive heart and chronic kidney disease with heart failure and stage 1 through stage 4 chronic kidney disease, or unspecified chronic kidney disease: Secondary | ICD-10-CM | POA: Diagnosis not present

## 2016-12-09 DIAGNOSIS — I2510 Atherosclerotic heart disease of native coronary artery without angina pectoris: Secondary | ICD-10-CM | POA: Diagnosis not present

## 2016-12-09 DIAGNOSIS — D638 Anemia in other chronic diseases classified elsewhere: Secondary | ICD-10-CM | POA: Diagnosis not present

## 2016-12-09 LAB — BMP8+EGFR
BUN / CREAT RATIO: 18 (ref 12–28)
BUN: 46 mg/dL — ABNORMAL HIGH (ref 8–27)
CO2: 22 mmol/L (ref 18–29)
CREATININE: 2.5 mg/dL — AB (ref 0.57–1.00)
Calcium: 9.9 mg/dL (ref 8.7–10.3)
Chloride: 102 mmol/L (ref 96–106)
GFR calc non Af Amer: 17 mL/min/{1.73_m2} — ABNORMAL LOW (ref >59)
GFR, EST AFRICAN AMERICAN: 20 mL/min/{1.73_m2} — AB (ref >59)
Glucose: 186 mg/dL — ABNORMAL HIGH (ref 65–99)
Potassium: 5.5 mmol/L — ABNORMAL HIGH (ref 3.5–5.2)
SODIUM: 141 mmol/L (ref 134–144)

## 2016-12-09 NOTE — Telephone Encounter (Signed)
patient saw primary doctor 12/08/16 Will close encounter

## 2016-12-12 ENCOUNTER — Other Ambulatory Visit: Payer: Self-pay | Admitting: *Deleted

## 2016-12-12 NOTE — Progress Notes (Deleted)
Cardiology Office Note    Date:  12/12/2016   ID:  NAVI POLIS, DOB 08/30/1932, MRN [id number]  PCP:  Kenn File, MD  Cardiologist: Dr. Percival Spanish   No chief complaint on file.   History of Present Illness:    [name] is a 83 y.o. female with past medical history of CAD (s/p DES to LAD and OM in 2008), chronic diastolic CHF, HTN, HLD, PAF (not on anticoagulation, heme (+) stool during recent admission), Stage 4 CKD and prior CVA who presents to the office today for hospital follow-up.   She was recently admitted from 12/21 - 11/29/2016 for an NSTEMI. With her Stage 4 CKD and initial creatinine of 3.4, she was treated medically. Discussions were held with the patent, her family, and Nephrology as she did not want to undergo dialysis and she would be at high risk of contrast induced nephropathy with a catheterization. Echo showed a preserved EF of 65-70% with no WMA. She was treated with Heparin for 48 hours along with being continued on ASA and a BB. During the hospitalization, she developed a low-grade fever, UTI, and PNA (LLL infiltrate on CXR). Was treated with Levaquin while admitted and discharged on Keflex.     Past Medical History:  Diagnosis Date  . CAD (coronary artery disease) 2003   Last catheterization 2008. Two-vessel PCI of the first OM branch and mid LAD.  Marland Kitchen CKD (chronic kidney disease)   . Congestive heart failure ([place])   . CVA (cerebral vascular accident) ([place]) [place]   . Dementia   . DJD (degenerative joint disease) of lumbar spine   . Hypercholesteremia   . Hypertension   . IDDM (insulin dependent diabetes mellitus) ([place])     Past Surgical History:  Procedure Laterality Date  . APPENDECTOMY    . KNEE SURGERY      Current Medications: Outpatient Medications Prior to Visit  Medication Sig Dispense Refill  . amLODipine (NORVASC) 10 MG tablet Take 1 tablet (10 mg total) by mouth daily. 90 tablet 0  . aspirin EC 81 MG EC tablet Take 1  tablet (81 mg total) by mouth daily. 30 tablet 11  . carvedilol (COREG) 6.25 MG tablet Take 1 tablet (6.25 mg total) by mouth 2 (two) times daily. 60 tablet 0  . cholecalciferol (VITAMIN D) 1000 units tablet Take 1,000 Units by mouth daily.    . cloNIDine (CATAPRES) 0.2 MG tablet Take 1 tablet (0.2 mg total) by mouth 3 (three) times daily. 90 tablet 3  . diclofenac sodium (VOLTAREN) 1 % GEL Apply 4 g topically 4 (four) times daily. 100 g 2  . HYDROcodone-acetaminophen (NORCO) 5-325 MG tablet Take 1 tablet by mouth every 6 (six) hours as needed for moderate pain. 30 tablet 0  . Insulin Glargine (LANTUS) 100 UNIT/ML Solostar Pen Inject 20-30 Units into the skin daily at 10 pm. 15 mL 5  . isosorbide mononitrate (IMDUR) 120 MG 24 hr tablet TAKE 1 TABLET DAILY 30 tablet 5  . levothyroxine (SYNTHROID, LEVOTHROID) 50 MCG tablet Take 1 tablet (50 mcg total) by mouth daily. 90 tablet 0  . nitroGLYCERIN (NITROSTAT) 0.4 MG SL tablet Place 1 tablet (0.4 mg total) under the tongue every 5 (five) minutes x 3 doses as needed for chest pain. 30 tablet 11  . rosuvastatin (CRESTOR) 10 MG tablet Take 1 tablet (10 mg total) by mouth daily. 30 tablet 5  . torsemide (DEMADEX) 20 MG tablet Take 1 tablet (20  mg total) by mouth daily. (Patient taking differently: Take 20 mg by mouth 2 (two) times daily. ) 30 tablet 1   No facility-administered medications prior to visit.      Allergies:   Propoxyphene n-acetaminophen and Simvastatin   Social History   Social History  . Marital status: Widowed    Spouse name: N/A  . Number of children: N/A  . Years of education: N/A   Occupational History  . Retired    Social History Main Topics  . Smoking status: Never Smoker  . Smokeless tobacco: Never Used  . Alcohol use No  . Drug use: No  . Sexual activity: No   Other Topics Concern  . Not on file   Social History Narrative      Lives alone.       Family History:  The patient's ***family history includes  Breast cancer in her daughter; Diabetes in her brother and sister; Drug abuse in her brother; Heart attack (age of onset: 47) in her father; Heart attack (age of onset: 32) in her mother; Liver cancer in her brother.   Review of Systems:   Please see the history of present illness.     General:  No chills, fever, night sweats or weight changes.  Cardiovascular:  No chest pain, dyspnea on exertion, edema, orthopnea, palpitations, paroxysmal nocturnal dyspnea. Dermatological: No rash, lesions/masses Respiratory: No cough, dyspnea Urologic: No hematuria, dysuria Abdominal:   No nausea, vomiting, diarrhea, bright red blood per rectum, melena, or hematemesis Neurologic:  No visual changes, wkns, changes in mental status. All other systems reviewed and are otherwise negative except as noted above.   Physical Exam:    VS:  There were no vitals taken for this visit.   General: Well developed, well nourished,female appearing in no acute distress. Head: Normocephalic, atraumatic, sclera non-icteric, no xanthomas, nares are without discharge.  Neck: No carotid bruits. JVD not elevated.  Lungs: Respirations regular and unlabored, without wheezes or rales.  Heart: ***Regular rate and rhythm. No S3 or S4.  No murmur, no rubs, or gallops appreciated. Abdomen: Soft, non-tender, non-distended with normoactive bowel sounds. No hepatomegaly. No rebound/guarding. No obvious abdominal masses. Msk:  Strength and tone appear normal for age. No joint deformities or effusions. Extremities: No clubbing or cyanosis. No edema.  Distal pedal pulses are 2+ bilaterally. Neuro: Alert and oriented X 3. Moves all extremities spontaneously. No focal deficits noted. Psych:  Responds to questions appropriately with a normal affect. Skin: No rashes or lesions noted  Wt Readings from Last 3 Encounters:  12/08/16 144 lb (65.3 kg)  12/07/16 154 lb (69.9 kg)  11/29/16 155 lb (70.3 kg)        Studies/Labs Reviewed:    EKG:  EKG is*** ordered today.  The ekg ordered today demonstrates ***  Recent Labs: 10/05/2016: TSH 0.859 11/17/2016: B Natriuretic Peptide 551.1 11/18/2016: ALT 8 11/29/2016: Hemoglobin 8.1; Platelets 203 12/08/2016: BUN 46; Creatinine, Ser 2.50; Potassium 5.5; Sodium 141   Lipid Panel    Component Value Date/Time   CHOL 134 11/18/2016 0030   CHOL 174 09/30/2014 0946   CHOL 177 06/21/2013 1016   TRIG 134 11/18/2016 0030   TRIG 175 (H) 03/21/2014 1559   TRIG 240 (H) 06/21/2013 1016   HDL 51 11/18/2016 0030   HDL 66 09/30/2014 0946   HDL 50 03/21/2014 1559   HDL 42 06/21/2013 1016   CHOLHDL 2.6 11/18/2016 0030   VLDL 27 11/18/2016 0030   LDLCALC 56 11/18/2016 0030  LDLCALC 82 09/30/2014 0946   LDLCALC 73 03/21/2014 1559   LDLCALC 87 06/21/2013 1016    Additional studies/ records that were reviewed today include:  ***  Assessment:    No diagnosis found.   Plan:   In order of problems listed above:  1. ***    Medication Adjustments/Labs and Tests Ordered: Current medicines are reviewed at length with the patient today.  Concerns regarding medicines are outlined above.  Medication changes, Labs and Tests ordered today are listed in the Patient Instructions below. There are no Patient Instructions on file for this visit.   Arna Medici, [place]  12/12/2016 8:15 PM    [facility] [place], [place], [place]  [address] Phone: [phone]; Fax: [phone]  [address] [place], [place] [address] Phone: [phone]

## 2016-12-12 NOTE — Patient Outreach (Signed)
Telephone call for transition of care week 3, spoke with daughter [name], HIPAA verified, daughter reports primary MD did place order for at home hospice and they are supposed to be coming out today for assessment,  Veranda states pt has been too weak to weigh, still has edema in feet, is taking correct dosage torsemide as increased by Dr. [name], daughter states " primary doctor decreased her insulin to 12 units daily"  CBG today 117, denies any issues with foley catheter and to see urologist tomorrow.  Veranda reports pt has all medications and taking as prescribed.    [facility] CM Care Plan Problem One   Flowsheet Row Most Recent Value  Care Plan Problem One  HIgh risk for readmission related to disease process/ CHF  Role Documenting the Problem One  Care Management Coordinator  Care Plan for Problem One  Active  THN Long Term Goal (31-90 days)  Pt will verbalize better understanding of disease process CHF to facilitate better outcomes and avoid hospital readmission within 90 days.  THN Long Term Goal Start Date  11/30/16 [name] restarted- pt hospitalized]  Interventions for Problem One Long Term Goal  Rn CM reinforced with daughter symptom managment, action plan, resources to call.  THN CM Short Term Goal #1 (0-30 days)  Pt will not readmit to hospital for HF in the next 30 days.  THN CM Short Term Goal #1 Start Date  11/30/16 [name] restarted- pt hospitalized ]  Interventions for Short Term Goal #1  RN CM reinforced CHF zones/ action plan and which provider to call  [facility] CM Short Term Goal #2 (0-30 days)  Pt will weigh daily and record within 30 days.  THN CM Short Term Goal #2 Start Date  11/24/16 [goal restarted]  Interventions for Short Term Goal #2  RN CM reviewed with daughter importance of weighing daily and adhering to action plan      PLAN Follow up this week regarding hospice  Jacqlyn Larsen [facility], [place] Coordinator [phone]

## 2016-12-13 ENCOUNTER — Other Ambulatory Visit: Payer: Self-pay | Admitting: Family Medicine

## 2016-12-13 ENCOUNTER — Ambulatory Visit: Payer: Medicare Other | Admitting: Student

## 2016-12-13 ENCOUNTER — Ambulatory Visit: Payer: Medicare Other | Admitting: Urology

## 2016-12-13 ENCOUNTER — Encounter: Payer: Self-pay | Admitting: *Deleted

## 2016-12-15 ENCOUNTER — Other Ambulatory Visit: Payer: Self-pay | Admitting: *Deleted

## 2016-12-15 ENCOUNTER — Encounter: Payer: Self-pay | Admitting: *Deleted

## 2016-12-15 NOTE — Patient Outreach (Signed)
Telephone call to patient's daughter [name] who verified pt care has been transferred to in home hospice care, RN CM received voicemail from [place] with [facility] reporting the same.   RN CM mailed case closure to patient and faxed case closure letter to primary MD Dr. [name].  Case closed today  Jacqlyn Larsen [facility], BSN [place] Coordinator [phone]

## 2016-12-19 ENCOUNTER — Other Ambulatory Visit: Payer: Self-pay | Admitting: Family Medicine

## 2016-12-19 ENCOUNTER — Other Ambulatory Visit: Payer: Medicare Other

## 2016-12-19 DIAGNOSIS — N183 Chronic kidney disease, stage 3 (moderate): Secondary | ICD-10-CM | POA: Diagnosis not present

## 2016-12-19 DIAGNOSIS — R809 Proteinuria, unspecified: Secondary | ICD-10-CM | POA: Diagnosis not present

## 2016-12-19 DIAGNOSIS — Z79899 Other long term (current) drug therapy: Secondary | ICD-10-CM | POA: Diagnosis not present

## 2016-12-19 DIAGNOSIS — D509 Iron deficiency anemia, unspecified: Secondary | ICD-10-CM | POA: Diagnosis not present

## 2016-12-19 DIAGNOSIS — I10 Essential (primary) hypertension: Secondary | ICD-10-CM | POA: Diagnosis not present

## 2016-12-21 DIAGNOSIS — E875 Hyperkalemia: Secondary | ICD-10-CM | POA: Diagnosis not present

## 2016-12-21 DIAGNOSIS — N185 Chronic kidney disease, stage 5: Secondary | ICD-10-CM | POA: Diagnosis not present

## 2016-12-21 DIAGNOSIS — D638 Anemia in other chronic diseases classified elsewhere: Secondary | ICD-10-CM | POA: Diagnosis not present

## 2016-12-22 ENCOUNTER — Other Ambulatory Visit: Payer: Self-pay | Admitting: Family Medicine

## 2016-12-30 ENCOUNTER — Telehealth: Payer: Self-pay

## 2016-12-30 NOTE — Telephone Encounter (Signed)
Ron Kenton Kingfisher from Hospice called and wanted to know if something can be sent in for depression for patient. Ron states that patient has been very depressed and noticed it was worse today. If approved please sent to [employer] Call Ron back at [phone].

## 2016-12-30 NOTE — Telephone Encounter (Signed)
Would recommend evaluation prior to treatment, There are no fast acting depression medications that are safe to start over the phone.   Laroy Apple, MD [facility] 12/30/2016, 2:27 PM

## 2016-12-30 NOTE — Telephone Encounter (Signed)
Pearson Forster at [facility] is aware of recommendations and will contact family and go from there

## 2017-01-04 ENCOUNTER — Ambulatory Visit: Payer: Self-pay | Admitting: *Deleted

## 2017-01-09 ENCOUNTER — Other Ambulatory Visit: Payer: Self-pay | Admitting: Family Medicine

## 2017-01-09 ENCOUNTER — Ambulatory Visit (INDEPENDENT_AMBULATORY_CARE_PROVIDER_SITE_OTHER): Payer: Medicare Other | Admitting: Family Medicine

## 2017-01-09 ENCOUNTER — Encounter: Payer: Self-pay | Admitting: Family Medicine

## 2017-01-09 VITALS — BP 136/63 | HR 67 | Temp 97.4°F | Ht 64.0 in | Wt 141.6 lb

## 2017-01-09 DIAGNOSIS — N184 Chronic kidney disease, stage 4 (severe): Secondary | ICD-10-CM

## 2017-01-09 DIAGNOSIS — E875 Hyperkalemia: Secondary | ICD-10-CM

## 2017-01-09 DIAGNOSIS — M7989 Other specified soft tissue disorders: Secondary | ICD-10-CM

## 2017-01-09 DIAGNOSIS — R300 Dysuria: Secondary | ICD-10-CM

## 2017-01-09 LAB — URINALYSIS
BILIRUBIN UA: NEGATIVE
GLUCOSE, UA: NEGATIVE
KETONES UA: NEGATIVE
Nitrite, UA: NEGATIVE
Specific Gravity, UA: 1.015 (ref 1.005–1.030)
UUROB: 0.2 mg/dL (ref 0.2–1.0)
pH, UA: 5 (ref 5.0–7.5)

## 2017-01-09 MED ORDER — CEPHALEXIN 500 MG PO CAPS: 500.0000 mg | ORAL_CAPSULE | Freq: Three times a day (TID) | ORAL | 0 refills | Status: DC

## 2017-01-09 NOTE — Progress Notes (Signed)
   HPI  Patient presents today for follow-up chronic medical conditions of black swelling.  Patient has a history of chronic diastolic CHF, chronic kidney disease stage IV, and has had leg swelling recently.  She seen nephrology and then placed on 40 mg torsemide in the a.m. and 20 mg p.m.  She complains of dysuria 1 day a few days ago, this has largely resolved. No fever, chills, sweats. He has polyuria with diuresis. Her daughter would like [place] to check a urine test today.  PMH: Smoking status noted ROS: Per HPI  Objective: BP 136/63   Pulse 67   Temp 97.4 F (36.3 C) (Oral)   Ht '5\' 4"'$  (1.626 m)   Wt 141 lb 9.6 oz (64.2 kg)   BMI 24.31 kg/m  Gen: NAD, alert, cooperative with exam HEENT: NCAT CV: RRR, good S1/S2, no murmur Resp: CTABL, no wheezes, non-labored Ext: 2+ pitting edema bilateral lower extremities symmetric Neuro: Alert and oriented, No gross deficits  Assessment and plan:  # Leg swelling Multifactorial leg swelling with chronic diastolic CHF plus chronic kidney disease. Patient is actually trending down on weight, lungs clear, doubt severe volume overload.  Recommended elevating legs, continue current dose of diuresis per nephrology.  # Dysuria Mild symptoms Checking UA, follow-up culture  # Hyperkalemia Asymptomatic With renal failure, recheck BMP for elevated potassium of 5.5 last visit She does not want HD, however could treat with Kayexalate  # CKD stage IV Rechecking labs Patient with    Orders Placed This Encounter  Procedures  . Urine culture    Order Specific Question:   Source    Answer:   clean catch  . BMP8+EGFR  . Urinalysis, Complete     Laroy Apple, MD [facility] 01/09/2017, 3:23 PM

## 2017-01-09 NOTE — Addendum Note (Signed)
Addended by: Karle Plumber on: 01/09/2017 03:36 PM   Modules accepted: Orders

## 2017-01-09 NOTE — Telephone Encounter (Signed)
Called and discussed with daughter- treat with keflex.   Laroy Apple, MD [facility] 01/09/2017, 4:58 PM

## 2017-01-09 NOTE — Patient Instructions (Signed)
Great to see you!  Lets plan on seeing her in 2 months unless you need [place] sooner.

## 2017-01-10 ENCOUNTER — Other Ambulatory Visit: Payer: Self-pay | Admitting: Family Medicine

## 2017-01-10 LAB — BMP8+EGFR
BUN/Creatinine Ratio: 21 (ref 12–28)
BUN: 68 mg/dL — AB (ref 8–27)
CALCIUM: 9.9 mg/dL (ref 8.7–10.3)
CO2: 24 mmol/L (ref 18–29)
Chloride: 96 mmol/L (ref 96–106)
Creatinine, Ser: 3.24 mg/dL (ref 0.57–1.00)
GFR calc non Af Amer: 13 mL/min/{1.73_m2} — ABNORMAL LOW (ref >59)
GFR, EST AFRICAN AMERICAN: 15 mL/min/{1.73_m2} — AB (ref >59)
GLUCOSE: 159 mg/dL — AB (ref 65–99)
POTASSIUM: 4.6 mmol/L (ref 3.5–5.2)
Sodium: 139 mmol/L (ref 134–144)

## 2017-01-10 MED ORDER — CEPHALEXIN 250 MG PO CAPS: 250.0000 mg | ORAL_CAPSULE | Freq: Every day | ORAL | 0 refills | Status: DC

## 2017-01-11 ENCOUNTER — Other Ambulatory Visit: Payer: Self-pay | Admitting: Family Medicine

## 2017-01-11 LAB — URINE CULTURE

## 2017-01-11 MED ORDER — AMOXICILLIN-POT CLAVULANATE 500-125 MG PO TABS: 1.0000 | ORAL_TABLET | Freq: Two times a day (BID) | ORAL | 0 refills | Status: DC

## 2017-01-17 ENCOUNTER — Ambulatory Visit: Payer: Medicare Other | Admitting: Urology

## 2017-01-25 ENCOUNTER — Ambulatory Visit (INDEPENDENT_AMBULATORY_CARE_PROVIDER_SITE_OTHER): Admitting: Family Medicine

## 2017-01-25 DIAGNOSIS — I5033 Acute on chronic diastolic (congestive) heart failure: Secondary | ICD-10-CM | POA: Diagnosis not present

## 2017-01-25 DIAGNOSIS — I249 Acute ischemic heart disease, unspecified: Secondary | ICD-10-CM

## 2017-01-25 DIAGNOSIS — E1122 Type 2 diabetes mellitus with diabetic chronic kidney disease: Secondary | ICD-10-CM | POA: Diagnosis not present

## 2017-01-25 DIAGNOSIS — I130 Hypertensive heart and chronic kidney disease with heart failure and stage 1 through stage 4 chronic kidney disease, or unspecified chronic kidney disease: Secondary | ICD-10-CM | POA: Diagnosis not present

## 2017-01-31 ENCOUNTER — Other Ambulatory Visit: Payer: Self-pay | Admitting: Family Medicine

## 2017-01-31 NOTE — Telephone Encounter (Signed)
Patient will check with company

## 2017-01-31 NOTE — Telephone Encounter (Signed)
Has she received any communication from the company what their new alternative is? This was previously well covered bu tfoirmulary changes are frequent.   Laroy Apple, MD [facility] 01/31/2017, 12:41 PM

## 2017-02-02 IMAGING — US US RENAL
1 series · 14 of 25 positions shown · non-contrast
Comparison: CT 05/14/2013

CLINICAL DATA: Acute renal insufficiency, diabetes and proteinuria.

EXAM:
RENAL / URINARY TRACT ULTRASOUND COMPLETE

[Series 1: us renal · 0.23mm/px · 14 of 44 slices shown]
[im 1/44]
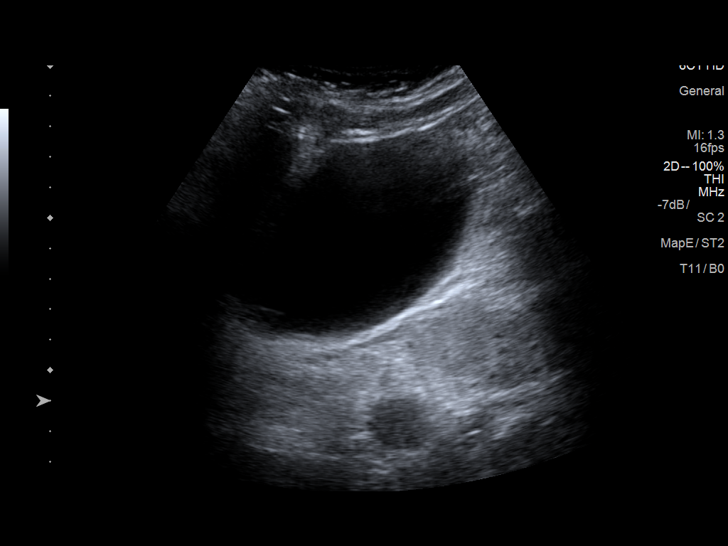
[im 4/44]
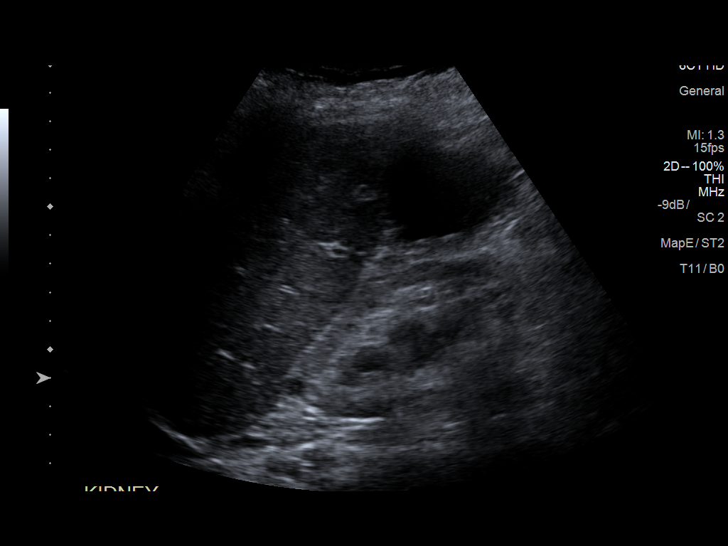
[im 8/44]
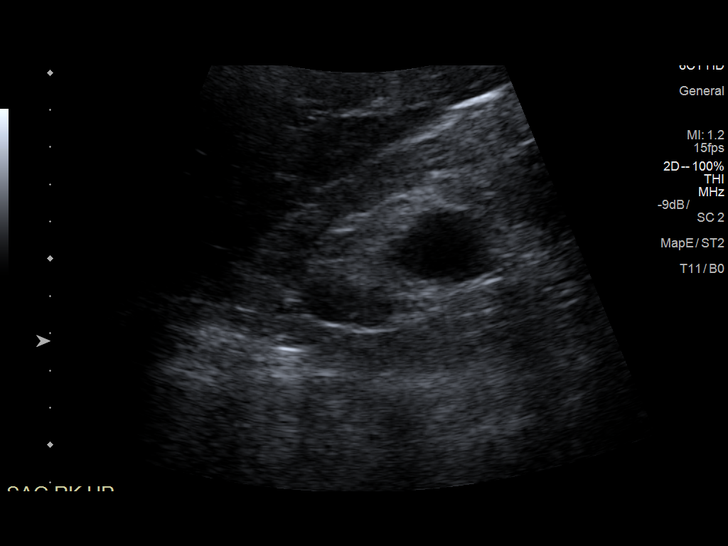
[im 11/44]
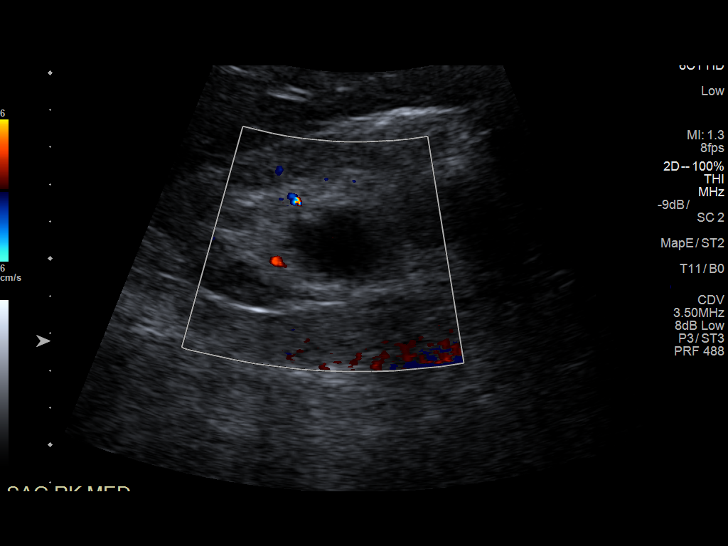
[im 15/44]
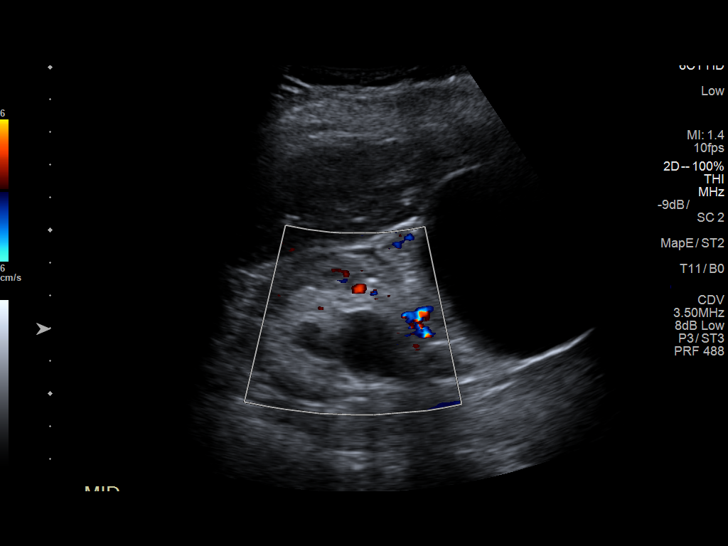
[im 17/44]
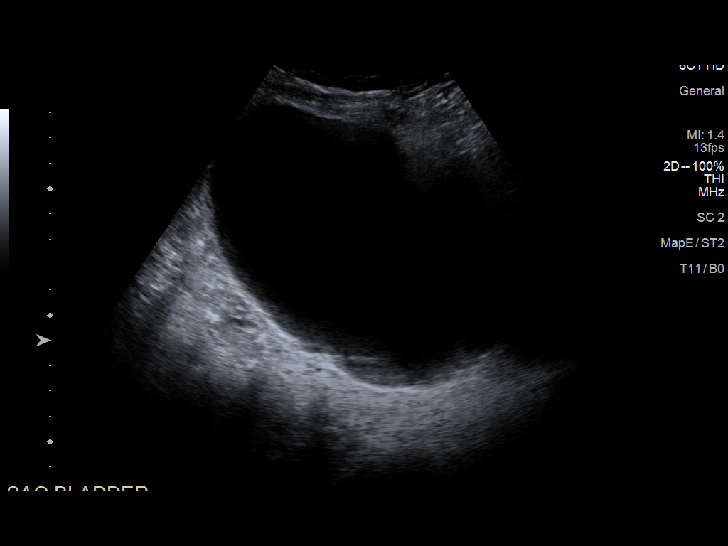
[im 20/44]
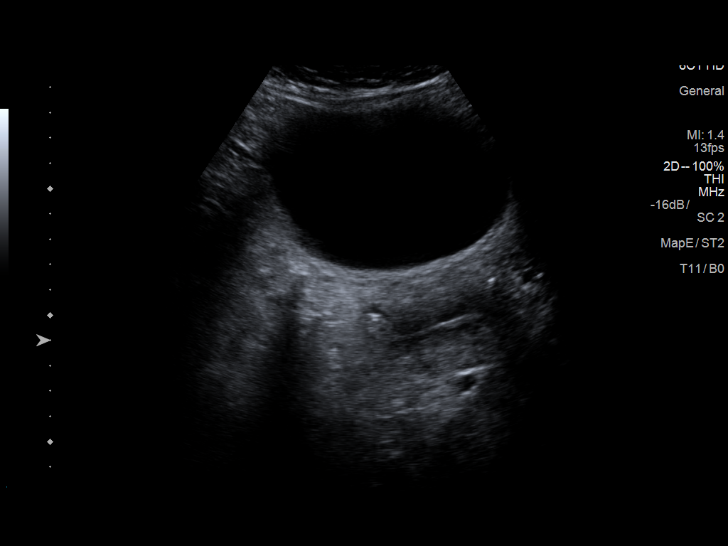
[im 24/44]
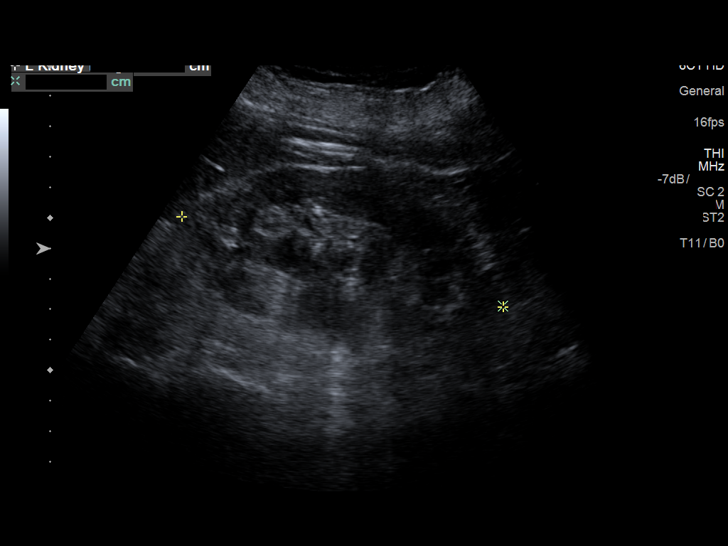
[im 27/44]
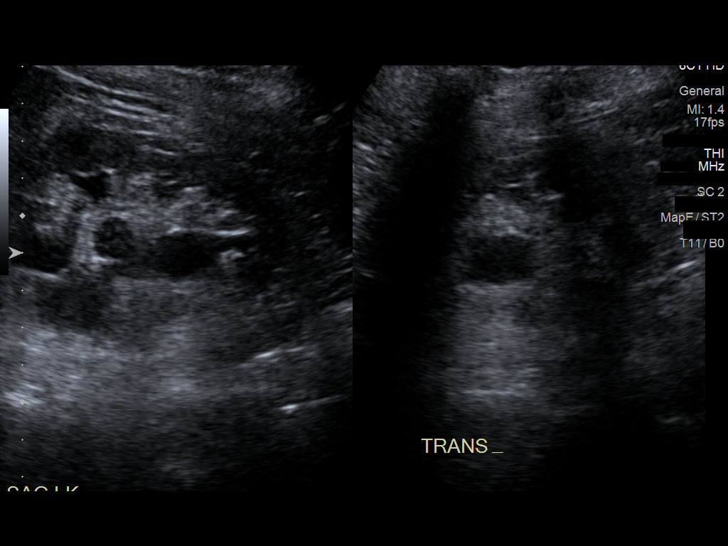
[im 29/44]
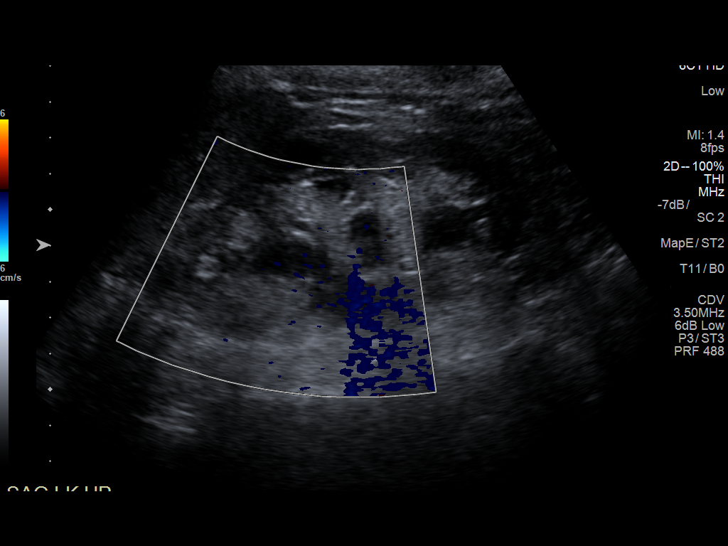
[im 33/44]
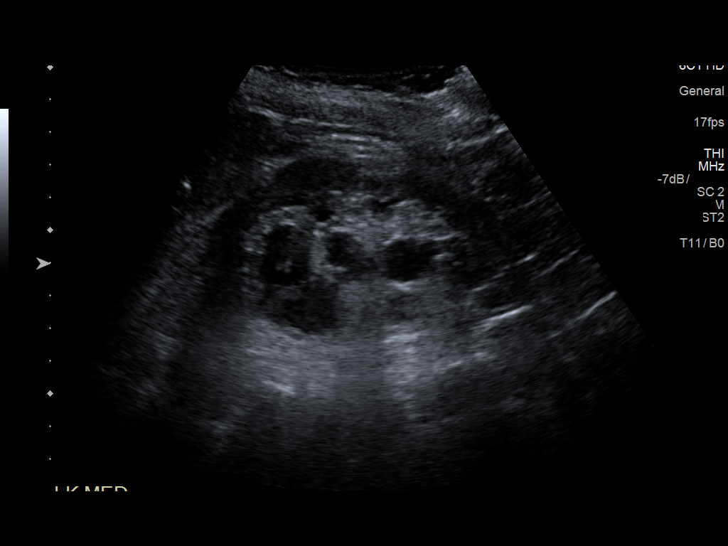
[im 36/44]
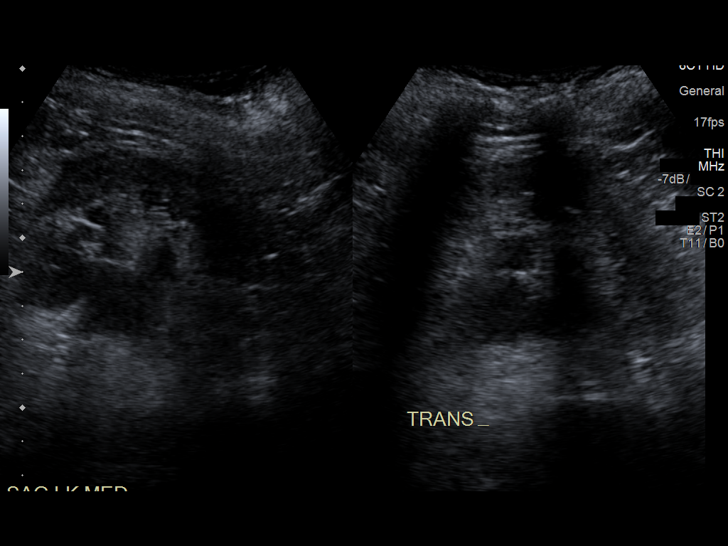
[im 40/44]
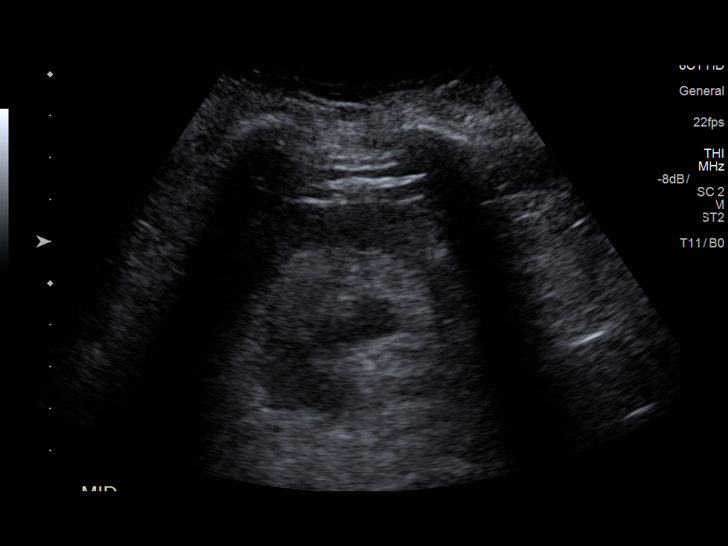
[im 44/44]
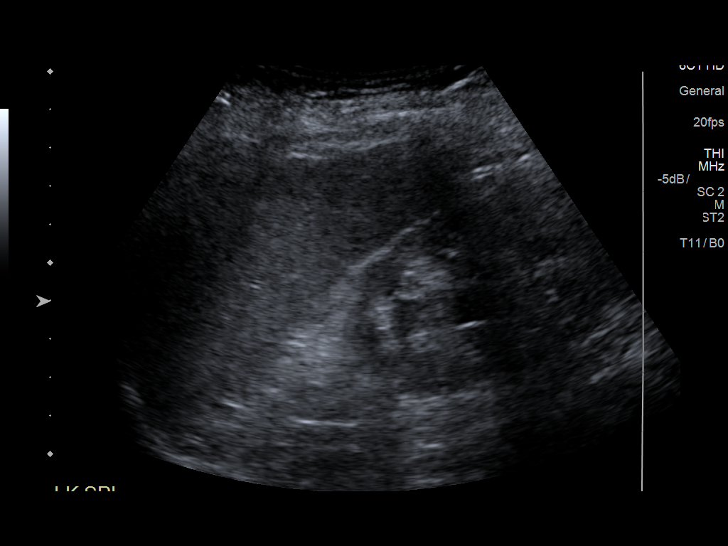

[14 of 25 positions shown; findings below may reference images not displayed]

FINDINGS: Right Kidney:

Length: 9.4 cm. Echogenicity within normal limits. Mild dilatation
of the intrarenal collecting system. No focal mass or stones.

Left Kidney:

Length: 10.3 cm. Echogenicity within normal limits. No mass or
hydronephrosis visualized. Three parapelvic renal cysts with the
largest measuring 2.8 cm over the lower pole.

Bladder:

Mildly distended.  Patient unable to void.

Single incidental gallstone.
IMPRESSION: Mild right-sided hydronephrosis.

Three parapelvic left renal cysts with the largest measuring 2.8 cm
over the lower pole.

Single gallstone.

## 2017-02-03 ENCOUNTER — Telehealth: Payer: Self-pay | Admitting: Family Medicine

## 2017-02-03 MED ORDER — INSULIN NPH (HUMAN) (ISOPHANE) 100 UNIT/ML ~~LOC~~ SUSP: 10.0000 [IU] | Freq: Two times a day (BID) | SUBCUTANEOUS | 1 refills | Status: DC

## 2017-02-03 MED ORDER — "INSULIN SYRINGE-NEEDLE U-100 27G X 1/2"" 1 ML MISC": 2 refills | Status: DC

## 2017-02-03 NOTE — Telephone Encounter (Signed)
Please review and advise.

## 2017-02-03 NOTE — Telephone Encounter (Signed)
Pt aware of change back to NPH insulin

## 2017-02-03 NOTE — Telephone Encounter (Signed)
Change back to NPH  Laroy Apple, MD [facility] 02/03/2017, 2:56 PM

## 2017-02-03 NOTE — Telephone Encounter (Signed)
Lantus cost her $250.00  please call something else in or put her back on her old medicine please.  She cannot afford to pick this up.   Uses [employer]...Marland KitchenMarland KitchenCould someone please call and let her know what to do.

## 2017-02-08 ENCOUNTER — Encounter: Payer: Self-pay | Admitting: *Deleted

## 2017-02-25 ENCOUNTER — Other Ambulatory Visit: Payer: Self-pay | Admitting: Family Medicine

## 2017-02-26 DIAGNOSIS — I509 Heart failure, unspecified: Secondary | ICD-10-CM

## 2017-02-26 HISTORY — DX: Heart failure, unspecified: I50.9

## 2017-02-27 ENCOUNTER — Other Ambulatory Visit: Payer: Medicare Other

## 2017-02-27 ENCOUNTER — Other Ambulatory Visit: Payer: Self-pay | Admitting: Family Medicine

## 2017-02-27 DIAGNOSIS — D509 Iron deficiency anemia, unspecified: Secondary | ICD-10-CM | POA: Diagnosis not present

## 2017-02-27 DIAGNOSIS — R809 Proteinuria, unspecified: Secondary | ICD-10-CM | POA: Diagnosis not present

## 2017-02-27 DIAGNOSIS — I10 Essential (primary) hypertension: Secondary | ICD-10-CM | POA: Diagnosis not present

## 2017-02-27 DIAGNOSIS — Z79899 Other long term (current) drug therapy: Secondary | ICD-10-CM | POA: Diagnosis not present

## 2017-02-27 DIAGNOSIS — E559 Vitamin D deficiency, unspecified: Secondary | ICD-10-CM | POA: Diagnosis not present

## 2017-03-01 ENCOUNTER — Other Ambulatory Visit: Payer: Self-pay | Admitting: Family Medicine

## 2017-03-01 ENCOUNTER — Telehealth: Payer: Self-pay

## 2017-03-01 DIAGNOSIS — R809 Proteinuria, unspecified: Secondary | ICD-10-CM | POA: Diagnosis not present

## 2017-03-01 DIAGNOSIS — I509 Heart failure, unspecified: Secondary | ICD-10-CM | POA: Diagnosis not present

## 2017-03-01 DIAGNOSIS — N184 Chronic kidney disease, stage 4 (severe): Secondary | ICD-10-CM | POA: Diagnosis not present

## 2017-03-01 DIAGNOSIS — E1129 Type 2 diabetes mellitus with other diabetic kidney complication: Secondary | ICD-10-CM | POA: Diagnosis not present

## 2017-03-01 DIAGNOSIS — I10 Essential (primary) hypertension: Secondary | ICD-10-CM | POA: Diagnosis not present

## 2017-03-01 NOTE — Telephone Encounter (Signed)
Patient was in office today to see Dr [name] and they were unaware that patient was on hospice. Daughter states that she told them in January but it was not documented there. Her fluid status and creatinine is better. They understand that she does not want dialysis or any cardiac intervention but they were discussing giving her shots to improve her iron. Charles the PA at the office or his nurse Thayer Headings would like to speak with you directly about how you feel about future treatment from them. Please give their office a call at [phone]

## 2017-03-02 NOTE — Telephone Encounter (Signed)
Attempted call, will ry again tomorrow. Office closed  Laroy Apple, MD [facility] 03/02/2017, 5:04 PM

## 2017-03-03 ENCOUNTER — Other Ambulatory Visit: Payer: Self-pay | Admitting: *Deleted

## 2017-03-03 MED ORDER — CARVEDILOL 6.25 MG PO TABS: 6.2500 mg | ORAL_TABLET | Freq: Two times a day (BID) | ORAL | 0 refills | Status: DC

## 2017-03-04 ENCOUNTER — Other Ambulatory Visit: Payer: Self-pay

## 2017-03-04 ENCOUNTER — Encounter (HOSPITAL_COMMUNITY): Payer: Self-pay

## 2017-03-04 ENCOUNTER — Emergency Department (HOSPITAL_COMMUNITY): Payer: Medicare Other

## 2017-03-04 ENCOUNTER — Inpatient Hospital Stay (HOSPITAL_COMMUNITY)
Admission: EM | Admit: 2017-03-04 | Discharge: 2017-03-09 | DRG: 392 | Disposition: A | Discharge status: Home Health | Payer: Medicare Other | Attending: Nephrology | Admitting: Nephrology

## 2017-03-04 DIAGNOSIS — R072 Precordial pain: Secondary | ICD-10-CM

## 2017-03-04 DIAGNOSIS — D631 Anemia in chronic kidney disease: Secondary | ICD-10-CM | POA: Diagnosis not present

## 2017-03-04 DIAGNOSIS — E785 Hyperlipidemia, unspecified: Secondary | ICD-10-CM | POA: Diagnosis not present

## 2017-03-04 DIAGNOSIS — I472 Ventricular tachycardia: Secondary | ICD-10-CM | POA: Diagnosis present

## 2017-03-04 DIAGNOSIS — Z8673 Personal history of transient ischemic attack (TIA), and cerebral infarction without residual deficits: Secondary | ICD-10-CM | POA: Diagnosis not present

## 2017-03-04 DIAGNOSIS — I447 Left bundle-branch block, unspecified: Secondary | ICD-10-CM | POA: Diagnosis not present

## 2017-03-04 DIAGNOSIS — I249 Acute ischemic heart disease, unspecified: Secondary | ICD-10-CM | POA: Diagnosis not present

## 2017-03-04 DIAGNOSIS — I2510 Atherosclerotic heart disease of native coronary artery without angina pectoris: Secondary | ICD-10-CM | POA: Diagnosis present

## 2017-03-04 DIAGNOSIS — I480 Paroxysmal atrial fibrillation: Secondary | ICD-10-CM | POA: Diagnosis not present

## 2017-03-04 DIAGNOSIS — I200 Unstable angina: Secondary | ICD-10-CM | POA: Diagnosis not present

## 2017-03-04 DIAGNOSIS — K224 Dyskinesia of esophagus: Secondary | ICD-10-CM | POA: Diagnosis present

## 2017-03-04 DIAGNOSIS — M19019 Primary osteoarthritis, unspecified shoulder: Secondary | ICD-10-CM | POA: Diagnosis present

## 2017-03-04 DIAGNOSIS — Z66 Do not resuscitate: Secondary | ICD-10-CM | POA: Diagnosis present

## 2017-03-04 DIAGNOSIS — N184 Chronic kidney disease, stage 4 (severe): Secondary | ICD-10-CM | POA: Diagnosis not present

## 2017-03-04 DIAGNOSIS — E118 Type 2 diabetes mellitus with unspecified complications: Secondary | ICD-10-CM

## 2017-03-04 DIAGNOSIS — Z79899 Other long term (current) drug therapy: Secondary | ICD-10-CM

## 2017-03-04 DIAGNOSIS — N179 Acute kidney failure, unspecified: Secondary | ICD-10-CM | POA: Diagnosis present

## 2017-03-04 DIAGNOSIS — F0390 Unspecified dementia without behavioral disturbance: Secondary | ICD-10-CM | POA: Diagnosis present

## 2017-03-04 DIAGNOSIS — E1122 Type 2 diabetes mellitus with diabetic chronic kidney disease: Secondary | ICD-10-CM | POA: Diagnosis not present

## 2017-03-04 DIAGNOSIS — I130 Hypertensive heart and chronic kidney disease with heart failure and stage 1 through stage 4 chronic kidney disease, or unspecified chronic kidney disease: Secondary | ICD-10-CM | POA: Diagnosis not present

## 2017-03-04 DIAGNOSIS — I252 Old myocardial infarction: Secondary | ICD-10-CM

## 2017-03-04 DIAGNOSIS — R933 Abnormal findings on diagnostic imaging of other parts of digestive tract: Secondary | ICD-10-CM | POA: Diagnosis not present

## 2017-03-04 DIAGNOSIS — I440 Atrioventricular block, first degree: Secondary | ICD-10-CM | POA: Diagnosis present

## 2017-03-04 DIAGNOSIS — K210 Gastro-esophageal reflux disease with esophagitis: Principal | ICD-10-CM | POA: Diagnosis present

## 2017-03-04 DIAGNOSIS — Z791 Long term (current) use of non-steroidal anti-inflammatories (NSAID): Secondary | ICD-10-CM

## 2017-03-04 DIAGNOSIS — Z9861 Coronary angioplasty status: Secondary | ICD-10-CM | POA: Diagnosis not present

## 2017-03-04 DIAGNOSIS — K828 Other specified diseases of gallbladder: Secondary | ICD-10-CM | POA: Diagnosis not present

## 2017-03-04 DIAGNOSIS — Z800 Family history of malignant neoplasm of digestive organs: Secondary | ICD-10-CM

## 2017-03-04 DIAGNOSIS — I495 Sick sinus syndrome: Secondary | ICD-10-CM | POA: Diagnosis not present

## 2017-03-04 DIAGNOSIS — Z888 Allergy status to other drugs, medicaments and biological substances status: Secondary | ICD-10-CM

## 2017-03-04 DIAGNOSIS — Z803 Family history of malignant neoplasm of breast: Secondary | ICD-10-CM

## 2017-03-04 DIAGNOSIS — R079 Chest pain, unspecified: Secondary | ICD-10-CM

## 2017-03-04 DIAGNOSIS — I5032 Chronic diastolic (congestive) heart failure: Secondary | ICD-10-CM | POA: Diagnosis present

## 2017-03-04 DIAGNOSIS — Z8249 Family history of ischemic heart disease and other diseases of the circulatory system: Secondary | ICD-10-CM

## 2017-03-04 DIAGNOSIS — K5730 Diverticulosis of large intestine without perforation or abscess without bleeding: Secondary | ICD-10-CM | POA: Diagnosis present

## 2017-03-04 DIAGNOSIS — I350 Nonrheumatic aortic (valve) stenosis: Secondary | ICD-10-CM | POA: Diagnosis present

## 2017-03-04 DIAGNOSIS — H8110 Benign paroxysmal vertigo, unspecified ear: Secondary | ICD-10-CM | POA: Diagnosis present

## 2017-03-04 DIAGNOSIS — K209 Esophagitis, unspecified without bleeding: Secondary | ICD-10-CM

## 2017-03-04 DIAGNOSIS — Z794 Long term (current) use of insulin: Secondary | ICD-10-CM | POA: Diagnosis not present

## 2017-03-04 DIAGNOSIS — N189 Chronic kidney disease, unspecified: Secondary | ICD-10-CM | POA: Diagnosis not present

## 2017-03-04 DIAGNOSIS — E7800 Pure hypercholesterolemia, unspecified: Secondary | ICD-10-CM | POA: Diagnosis not present

## 2017-03-04 DIAGNOSIS — I10 Essential (primary) hypertension: Secondary | ICD-10-CM | POA: Diagnosis not present

## 2017-03-04 DIAGNOSIS — Z79891 Long term (current) use of opiate analgesic: Secondary | ICD-10-CM

## 2017-03-04 DIAGNOSIS — R110 Nausea: Secondary | ICD-10-CM | POA: Diagnosis not present

## 2017-03-04 DIAGNOSIS — R918 Other nonspecific abnormal finding of lung field: Secondary | ICD-10-CM

## 2017-03-04 DIAGNOSIS — IMO0001 Reserved for inherently not codable concepts without codable children: Secondary | ICD-10-CM

## 2017-03-04 DIAGNOSIS — R001 Bradycardia, unspecified: Secondary | ICD-10-CM | POA: Diagnosis not present

## 2017-03-04 DIAGNOSIS — M1990 Unspecified osteoarthritis, unspecified site: Secondary | ICD-10-CM | POA: Diagnosis present

## 2017-03-04 DIAGNOSIS — Z7982 Long term (current) use of aspirin: Secondary | ICD-10-CM

## 2017-03-04 DIAGNOSIS — I4729 Other ventricular tachycardia: Secondary | ICD-10-CM

## 2017-03-04 DIAGNOSIS — I679 Cerebrovascular disease, unspecified: Secondary | ICD-10-CM | POA: Diagnosis present

## 2017-03-04 DIAGNOSIS — Z833 Family history of diabetes mellitus: Secondary | ICD-10-CM

## 2017-03-04 DIAGNOSIS — R0789 Other chest pain: Secondary | ICD-10-CM | POA: Diagnosis not present

## 2017-03-04 DIAGNOSIS — E663 Overweight: Secondary | ICD-10-CM | POA: Diagnosis present

## 2017-03-04 LAB — CBC WITH DIFFERENTIAL/PLATELET
Basophils Absolute: 0 10*3/uL (ref 0.0–0.1)
Basophils Relative: 0 %
EOS ABS: 0 10*3/uL (ref 0.0–0.7)
EOS PCT: 1 %
HCT: 25.9 % — ABNORMAL LOW (ref 36.0–46.0)
Hemoglobin: 8.6 g/dL — ABNORMAL LOW (ref 12.0–15.0)
LYMPHS ABS: 1 10*3/uL (ref 0.7–4.0)
Lymphocytes Relative: 18 %
MCH: 29.2 pg (ref 26.0–34.0)
MCHC: 33.2 g/dL (ref 30.0–36.0)
MCV: 87.8 fL (ref 78.0–100.0)
MONOS PCT: 6 %
Monocytes Absolute: 0.4 10*3/uL (ref 0.1–1.0)
Neutro Abs: 4.2 10*3/uL (ref 1.7–7.7)
Neutrophils Relative %: 75 %
Platelets: 194 10*3/uL (ref 150–400)
RBC: 2.95 MIL/uL — AB (ref 3.87–5.11)
RDW: 12.7 % (ref 11.5–15.5)
WBC: 5.6 10*3/uL (ref 4.0–10.5)

## 2017-03-04 LAB — COMPREHENSIVE METABOLIC PANEL
ALK PHOS: 67 U/L (ref 38–126)
ALT: 9 U/L — AB (ref 14–54)
AST: 12 U/L — ABNORMAL LOW (ref 15–41)
Albumin: 3.4 g/dL — ABNORMAL LOW (ref 3.5–5.0)
Anion gap: 14 (ref 5–15)
BILIRUBIN TOTAL: 0.5 mg/dL (ref 0.3–1.2)
BUN: 68 mg/dL — ABNORMAL HIGH (ref 6–20)
CALCIUM: 9.9 mg/dL (ref 8.9–10.3)
CO2: 21 mmol/L — AB (ref 22–32)
CREATININE: 3.05 mg/dL — AB (ref 0.44–1.00)
Chloride: 103 mmol/L (ref 101–111)
GFR calc non Af Amer: 13 mL/min — ABNORMAL LOW (ref >60)
GFR, EST AFRICAN AMERICAN: 15 mL/min — AB (ref >60)
Glucose, Bld: 205 mg/dL — ABNORMAL HIGH (ref 65–99)
Potassium: 4.5 mmol/L (ref 3.5–5.1)
SODIUM: 138 mmol/L (ref 135–145)
TOTAL PROTEIN: 6.4 g/dL — AB (ref 6.5–8.1)

## 2017-03-04 LAB — I-STAT TROPONIN, ED
TROPONIN I, POC: 0.01 ng/mL (ref 0.00–0.08)
TROPONIN I, POC: 0.02 ng/mL (ref 0.00–0.08)

## 2017-03-04 LAB — TROPONIN I: TROPONIN I: 0.03 ng/mL — AB (ref <0.03)

## 2017-03-04 LAB — PROTIME-INR
INR: 1
Prothrombin Time: 13.2 seconds (ref 11.4–15.2)

## 2017-03-04 LAB — FERRITIN: FERRITIN: 635 ng/mL — AB (ref 11–307)

## 2017-03-04 LAB — MAGNESIUM: Magnesium: 2.3 mg/dL (ref 1.7–2.4)

## 2017-03-04 LAB — LIPASE, BLOOD: Lipase: 19 U/L (ref 11–51)

## 2017-03-04 LAB — FOLATE: Folate: 30.5 ng/mL (ref >5.9)

## 2017-03-04 LAB — IRON AND TIBC
Iron: 35 ug/dL (ref 28–170)
Saturation Ratios: 14 % (ref 10.4–31.8)
TIBC: 259 ug/dL (ref 250–450)
UIBC: 224 ug/dL

## 2017-03-04 LAB — RETICULOCYTES
RBC.: 3.09 MIL/uL — ABNORMAL LOW (ref 3.87–5.11)
RETIC COUNT ABSOLUTE: 58.7 10*3/uL (ref 19.0–186.0)
RETIC CT PCT: 1.9 % (ref 0.4–3.1)

## 2017-03-04 LAB — TSH: TSH: 0.786 u[IU]/mL (ref 0.350–4.500)

## 2017-03-04 LAB — VITAMIN B12: Vitamin B-12: 545 pg/mL (ref 180–914)

## 2017-03-04 MED ORDER — METOPROLOL TARTRATE 5 MG/5ML IV SOLN: 5.0000 mg | Freq: Four times a day (QID) | INTRAVENOUS | Status: DC

## 2017-03-04 MED ORDER — PROCHLORPERAZINE EDISYLATE 5 MG/ML IJ SOLN
10.0000 mg | Freq: Once | INTRAMUSCULAR | Status: AC
  Administered 2017-03-04: 10 mg via INTRAVENOUS
  Filled 2017-03-04: qty 2

## 2017-03-04 MED ORDER — PANTOPRAZOLE SODIUM 40 MG IV SOLR
40.0000 mg | Freq: Two times a day (BID) | INTRAVENOUS | Status: DC
  Administered 2017-03-04 – 2017-03-05 (×3): 40 mg via INTRAVENOUS
  Filled 2017-03-04 (×3): qty 40

## 2017-03-04 MED ORDER — LEVOTHYROXINE SODIUM 100 MCG IV SOLR
25.0000 ug | INTRAVENOUS | Status: DC
  Administered 2017-03-05 – 2017-03-06 (×2): 25 ug via INTRAVENOUS
  Filled 2017-03-04 (×2): qty 5

## 2017-03-04 MED ORDER — ASPIRIN 81 MG PO CHEW
81.0000 mg | CHEWABLE_TABLET | Freq: Every day | ORAL | Status: DC
  Administered 2017-03-05 – 2017-03-09 (×5): 81 mg via ORAL
  Filled 2017-03-04 (×5): qty 1

## 2017-03-04 MED ORDER — CLONIDINE HCL 0.2 MG PO TABS
0.2000 mg | ORAL_TABLET | Freq: Three times a day (TID) | ORAL | Status: DC
  Administered 2017-03-04 – 2017-03-05 (×2): 0.2 mg via ORAL
  Filled 2017-03-04 (×2): qty 1

## 2017-03-04 MED ORDER — ASPIRIN 81 MG PO CHEW: 324.0000 mg | CHEWABLE_TABLET | ORAL | Status: AC

## 2017-03-04 MED ORDER — ASPIRIN EC 81 MG PO TBEC: 81.0000 mg | DELAYED_RELEASE_TABLET | Freq: Every day | ORAL | Status: DC

## 2017-03-04 MED ORDER — SUCRALFATE 1 GM/10ML PO SUSP
1.0000 g | Freq: Four times a day (QID) | ORAL | Status: DC
  Administered 2017-03-04 – 2017-03-09 (×17): 1 g via ORAL
  Filled 2017-03-04 (×18): qty 10

## 2017-03-04 MED ORDER — MORPHINE SULFATE (PF) 4 MG/ML IV SOLN
4.0000 mg | Freq: Once | INTRAVENOUS | Status: DC
  Filled 2017-03-04: qty 1

## 2017-03-04 MED ORDER — HEPARIN SODIUM (PORCINE) 5000 UNIT/ML IJ SOLN
5000.0000 [IU] | Freq: Three times a day (TID) | INTRAMUSCULAR | Status: DC
  Administered 2017-03-04 – 2017-03-09 (×13): 5000 [IU] via SUBCUTANEOUS
  Filled 2017-03-04 (×13): qty 1

## 2017-03-04 MED ORDER — NITROGLYCERIN 2 % TD OINT
1.0000 [in_us] | TOPICAL_OINTMENT | Freq: Once | TRANSDERMAL | Status: AC
  Administered 2017-03-04: 1 [in_us] via TOPICAL
  Filled 2017-03-04: qty 1

## 2017-03-04 MED ORDER — ASPIRIN EC 81 MG PO TBEC
81.0000 mg | DELAYED_RELEASE_TABLET | Freq: Once | ORAL | Status: DC
  Filled 2017-03-04: qty 1

## 2017-03-04 MED ORDER — SODIUM CHLORIDE 0.9 % IV SOLN
INTRAVENOUS | Status: AC
  Administered 2017-03-04: 19:00:00 via INTRAVENOUS

## 2017-03-04 MED ORDER — ACETAMINOPHEN 325 MG PO TABS: 650.0000 mg | ORAL_TABLET | ORAL | Status: DC | PRN

## 2017-03-04 MED ORDER — ONDANSETRON HCL 4 MG/2ML IJ SOLN
4.0000 mg | Freq: Once | INTRAMUSCULAR | Status: AC
  Administered 2017-03-04: 4 mg via INTRAVENOUS
  Filled 2017-03-04: qty 2

## 2017-03-04 MED ORDER — CALCIUM CARBONATE ANTACID 500 MG PO CHEW
1.0000 | CHEWABLE_TABLET | Freq: Three times a day (TID) | ORAL | Status: DC | PRN
  Administered 2017-03-07 – 2017-03-08 (×3): 200 mg via ORAL
  Filled 2017-03-04 (×3): qty 1

## 2017-03-04 MED ORDER — MORPHINE SULFATE (PF) 2 MG/ML IV SOLN
2.0000 mg | INTRAVENOUS | Status: DC | PRN
  Administered 2017-03-04 – 2017-03-07 (×5): 2 mg via INTRAVENOUS
  Filled 2017-03-04 (×5): qty 1

## 2017-03-04 MED ORDER — GI COCKTAIL ~~LOC~~
30.0000 mL | Freq: Once | ORAL | Status: DC
  Filled 2017-03-04: qty 30

## 2017-03-04 MED ORDER — ISOSORBIDE MONONITRATE ER 60 MG PO TB24
120.0000 mg | ORAL_TABLET | Freq: Every day | ORAL | Status: DC
  Administered 2017-03-05 – 2017-03-09 (×5): 120 mg via ORAL
  Filled 2017-03-04 (×5): qty 2

## 2017-03-04 MED ORDER — HYDROCODONE-ACETAMINOPHEN 5-325 MG PO TABS
1.0000 | ORAL_TABLET | Freq: Four times a day (QID) | ORAL | Status: DC | PRN
  Administered 2017-03-06 – 2017-03-08 (×3): 1 via ORAL
  Filled 2017-03-04 (×4): qty 1

## 2017-03-04 MED ORDER — PROCHLORPERAZINE EDISYLATE 5 MG/ML IJ SOLN: 10.0000 mg | INTRAMUSCULAR | Status: DC | PRN

## 2017-03-04 MED ORDER — ROSUVASTATIN CALCIUM 10 MG PO TABS
10.0000 mg | ORAL_TABLET | Freq: Every day | ORAL | Status: DC
  Administered 2017-03-05 – 2017-03-08 (×4): 10 mg via ORAL
  Filled 2017-03-04 (×4): qty 1

## 2017-03-04 MED ORDER — ASPIRIN 300 MG RE SUPP
300.0000 mg | RECTAL | Status: AC
  Administered 2017-03-04: 300 mg via RECTAL
  Filled 2017-03-04: qty 1

## 2017-03-04 MED ORDER — GI COCKTAIL ~~LOC~~
30.0000 mL | Freq: Three times a day (TID) | ORAL | Status: DC | PRN
  Administered 2017-03-05 – 2017-03-08 (×5): 30 mL via ORAL
  Filled 2017-03-04 (×5): qty 30

## 2017-03-04 MED ORDER — AMLODIPINE BESYLATE 10 MG PO TABS
10.0000 mg | ORAL_TABLET | Freq: Every day | ORAL | Status: DC
  Administered 2017-03-05 – 2017-03-09 (×5): 10 mg via ORAL
  Filled 2017-03-04 (×5): qty 1

## 2017-03-04 MED ORDER — SODIUM CHLORIDE 0.9 % IV SOLN
Freq: Once | INTRAVENOUS | Status: AC
  Administered 2017-03-04: 17:00:00 via INTRAVENOUS

## 2017-03-04 MED ORDER — NITROGLYCERIN 0.4 MG SL SUBL
0.4000 mg | SUBLINGUAL_TABLET | SUBLINGUAL | Status: DC | PRN
  Administered 2017-03-05 (×2): 0.4 mg via SUBLINGUAL
  Filled 2017-03-04: qty 1

## 2017-03-04 MED ORDER — SODIUM CHLORIDE 0.9 % IV SOLN
8.0000 mg/h | INTRAVENOUS | Status: DC
  Administered 2017-03-04: 8 mg/h via INTRAVENOUS
  Filled 2017-03-04 (×2): qty 80

## 2017-03-04 MED ORDER — PANTOPRAZOLE SODIUM 40 MG IV SOLR
40.0000 mg | Freq: Once | INTRAVENOUS | Status: AC
  Administered 2017-03-04: 40 mg via INTRAVENOUS
  Filled 2017-03-04: qty 40

## 2017-03-04 MED ORDER — METOPROLOL TARTRATE 5 MG/5ML IV SOLN
5.0000 mg | Freq: Four times a day (QID) | INTRAVENOUS | Status: DC
  Administered 2017-03-04 – 2017-03-05 (×3): 5 mg via INTRAVENOUS
  Filled 2017-03-04 (×3): qty 5

## 2017-03-04 MED ORDER — GI COCKTAIL ~~LOC~~
30.0000 mL | Freq: Once | ORAL | Status: AC
  Administered 2017-03-04: 30 mL via ORAL
  Filled 2017-03-04: qty 30

## 2017-03-04 MED ORDER — DICLOFENAC SODIUM 1 % TD GEL: 4.0000 g | Freq: Four times a day (QID) | TRANSDERMAL | Status: DC

## 2017-03-04 MED ORDER — MORPHINE SULFATE (PF) 4 MG/ML IV SOLN
4.0000 mg | Freq: Once | INTRAVENOUS | Status: AC
  Administered 2017-03-04: 4 mg via INTRAVENOUS
  Filled 2017-03-04: qty 1

## 2017-03-04 MED ORDER — ONDANSETRON HCL 4 MG/2ML IJ SOLN
4.0000 mg | Freq: Four times a day (QID) | INTRAMUSCULAR | Status: DC | PRN
  Administered 2017-03-06 – 2017-03-07 (×3): 4 mg via INTRAVENOUS
  Filled 2017-03-04 (×2): qty 2

## 2017-03-04 NOTE — ED Notes (Signed)
Admitting at bedside 

## 2017-03-04 NOTE — H&P (Signed)
History and Physical    Delrae Hagey Banka ERX:[id number] DOB: Nov 07, 1932 DOA: 03/04/2017  PCP: Kenn File, MD  Patient coming from: Home  I have personally briefly reviewed patient's old medical records in [place]  Chief Complaint: Nausea/vomiting/chest pain  HPI: [name] is a 83 y.o. female with medical history significant of coronary artery status post LAD POBA in 2003 and a LAD DES in 2008, chronic kidney disease stage IV, hypertension, diabetes, hyperlipidemia disease, history of CVA, last hospitalized 11/17/2016- 11/29/2016 for non-STEMI/acute coronary syndrome at which time patient was offered a cardiac catheterization however declined patient was treated with medical management on aspirin and Coreg and a statin. Patient also noted during the hospitalization to have a new diagnosis of paroxysmal atrial fibrillation CHA2DS2-VASC score of 7 and rate controlled on Coreg and place on aspirin due to positive FOB. Patient presented to the ED with sudden onset nausea and emesis with substernal chest pain radiating to bilateral upper extremities. Patient stated that to 3 nitroglycerin tablets with some improvement. Patient describes a midsternal chest pain is nonradiating, was constant this morning however now intermittent, sharp in nature with some intermitted associated burning sensation. Patient denies any fevers, no chills, no dysphagia, no shortness of breath, no syncope, no cough, no abdominal pain, no diarrhea, no constipation, no dysuria, no melena, no hematemesis, no hematochezia. At the time of my interview, patient was nauseous and actively vomiting. [facility] were called to admit the patient for further evaluation and management.  ED Course: Patient was seen in the ED CT chest abdomen and pelvis was obtained which showed somewhat thickened walls of esophagus suggesting diffuse esophagitis of infectious or inflammatory nature, mild atelectasis/scarring at each lung  base no evidence of pneumonia, diffuse coronary artery calcifications, aortic atherosclerosis, colonic diverticulosis without evidence of acute diverticulitis, no bowel obstruction, uterine fibroid measuring approximate 4 cm. Comprehensive metabolic profile obtained at a sodium of 138 bicarbonate of 21 and glucose of 205 BUN of 68 creatinine of 3.05, albumin of 3.4 otherwise was within normal limits. One of care troponin was negative 2. CBC had a hemoglobin of 8.6 otherwise was within normal limits. INR was 1.00. Patient was given some morphine IV, GI cocktail, but sugars were patch placed, patient placed on the Protonix drip. EKG with left bundle branch block unchanged from prior EKG.  Review of Systems: As per HPI otherwise 10 point review of systems negative.  Past Medical History:  Diagnosis Date  . CAD (coronary artery disease) 2003   Last catheterization 2008. Two-vessel PCI of the first OM branch and mid LAD.  Marland Kitchen CKD (chronic kidney disease)   . Congestive heart failure ([place])   . CVA (cerebral vascular accident) ([place]) [place]   . Dementia   . DJD (degenerative joint disease) of lumbar spine   . Hypercholesteremia   . Hypertension   . IDDM (insulin dependent diabetes mellitus) ([place])     Past Surgical History:  Procedure Laterality Date  . APPENDECTOMY    . KNEE SURGERY       reports that she has never smoked. She has never used smokeless tobacco. She reports that she does not drink alcohol or use drugs.  Allergies  Allergen Reactions  . Propoxyphene N-Acetaminophen     Pt does not know reaction  . Simvastatin Other (See Comments)    headache    Family History  Problem Relation Age of Onset  . Heart attack Mother 50  . Heart attack  Father 51  . Diabetes Brother     RETINOPATHY   . Drug abuse Brother   . Liver cancer Brother   . Breast cancer Daughter   . Diabetes Sister    Father deceased age 23 from heart attack. Mother deceased age 5 from a heart  attack  Prior to Admission medications   Medication Sig Start Date End Date Taking? Authorizing Provider  amLODipine (NORVASC) 10 MG tablet TAKE 1 TABLET DAILY 02/28/17  Yes Timmothy Euler, MD  aspirin EC 81 MG EC tablet Take 1 tablet (81 mg total) by mouth daily. 10/08/16  Yes Bhavinkumar Bhagat, PA  carvedilol (COREG) 6.25 MG tablet Take 1 tablet (6.25 mg total) by mouth 2 (two) times daily. 03/03/17  Yes Timmothy Euler, MD  cholecalciferol (VITAMIN D) 1000 units tablet Take 1,000 Units by mouth daily.   Yes Historical Provider, MD  cloNIDine (CATAPRES) 0.2 MG tablet Take 1 tablet (0.2 mg total) by mouth 3 (three) times daily. 10/27/16  Yes Timmothy Euler, MD  diclofenac sodium (VOLTAREN) 1 % GEL Apply 4 g topically 4 (four) times daily. 07/29/16  Yes Timmothy Euler, MD  HYDROcodone-acetaminophen (NORCO) 5-325 MG tablet Take 1 tablet by mouth every 6 (six) hours as needed for moderate pain. 12/08/16  Yes Timmothy Euler, MD  insulin NPH Human (HUMULIN N) 100 UNIT/ML injection Inject 0.1 mLs (10 Units total) into the skin 2 (two) times daily before a meal. 02/03/17  Yes Timmothy Euler, MD  isosorbide mononitrate (IMDUR) 120 MG 24 hr tablet TAKE 1 TABLET DAILY 03/02/17  Yes Timmothy Euler, MD  levothyroxine (SYNTHROID, LEVOTHROID) 50 MCG tablet TAKE 1 TABLET DAILY 12/23/16  Yes Timmothy Euler, MD  nitroGLYCERIN (NITROSTAT) 0.4 MG SL tablet PLACE 1 TABLET UNDER THE TONGUE AT ONSET OF CHEST PAIN EVERY 5 MINTUES UP TO 3 TIMES AS NEEDED 02/27/17  Yes Timmothy Euler, MD  polyethylene glycol powder (GLYCOLAX/MIRALAX) powder MIX 1 CAPFUL (17 GRAMS) WITH 8OZ OF WATER OR JUICE DAILY. 12/13/16  Yes Timmothy Euler, MD  rosuvastatin (CRESTOR) 10 MG tablet Take 1 tablet (10 mg total) by mouth daily. 10/17/16  Yes Timmothy Euler, MD  torsemide (DEMADEX) 20 MG tablet Take 1 tablet (20 mg total) by mouth daily. Patient taking differently: Take 20 mg by mouth 3 (three) times daily.  11/08/16  Yes  Timmothy Euler, MD  ACCU-CHEK AVIVA PLUS test strip CHECK BLOOD SUGER UP TO 3 TIMES A DAY 02/27/17   Timmothy Euler, MD  Insulin Syringe-Needle U-100 27G X 1/2" 1 ML MISC Use as needed 02/03/17   Timmothy Euler, MD  SENEXON-S 8.6-50 MG tablet TAKE 4 TABLETS BY MOUTH 3 TIMES A DAY UNTIL BOWEL MOVEMENT. Patient not taking: Reported on 03/04/2017 12/13/16   Timmothy Euler, MD    Physical Exam: Vitals:   03/04/17 1528 03/04/17 1530 03/04/17 1600 03/04/17 1630  BP:  (!) 175/59 (!) 165/52 (!) 166/58  Pulse: 92 88 80 78  Resp: 20 16 12    Temp:      TempSrc:      SpO2: 100% 99% 97% 94%    Constitutional: NAD, calm, comfortable Vitals:   03/04/17 1528 03/04/17 1530 03/04/17 1600 03/04/17 1630  BP:  (!) 175/59 (!) 165/52 (!) 166/58  Pulse: 92 88 80 78  Resp: 20 16 12    Temp:      TempSrc:      SpO2: 100% 99% 97% 94%   Eyes:  PERRLA, lids and conjunctivae normal ENMT: Mucous membranes are dry. Posterior pharynx clear of any exudate or lesions.Normal dentition.  Neck: normal, supple, no masses, no thyromegaly Respiratory: clear to auscultation bilaterally, no wheezing, no crackles. Normal respiratory effort. No accessory muscle use.  Cardiovascular: Regular rate and rhythm, no murmurs / rubs / gallops. No extremity edema. 2+ pedal pulses. No carotid bruits.  Abdomen: no tenderness, no masses palpated. No hepatosplenomegaly. Bowel sounds positive.  Musculoskeletal: no clubbing / cyanosis. No joint deformity upper and lower extremities. Good ROM, no contractures. Normal muscle tone.  Skin: no rashes, lesions, ulcers. No induration Neurologic: CN 2-12 grossly intact. Sensation intact, DTR normal. Strength 5/5 in all 4.  Psychiatric: Normal judgment and insight. Alert and oriented x 3. Normal mood.    Labs on Admission: I have personally reviewed following labs and imaging studies  CBC:  Recent Labs Lab 03/04/17 1206  WBC 5.6  NEUTROABS 4.2  HGB 8.6*  HCT 25.9*  MCV 87.8  PLT  740   Basic Metabolic Panel:  Recent Labs Lab 03/04/17 1206  NA 138  K 4.5  CL 103  CO2 21*  GLUCOSE 205*  BUN 68*  CREATININE 3.05*  CALCIUM 9.9   GFR: CrCl cannot be calculated (Unknown ideal weight.). Liver Function Tests:  Recent Labs Lab 03/04/17 1206  AST 12*  ALT 9*  ALKPHOS 67  BILITOT 0.5  PROT 6.4*  ALBUMIN 3.4*    Recent Labs Lab 03/04/17 1206  LIPASE 19   No results for input(s): AMMONIA in the last 168 hours. Coagulation Profile:  Recent Labs Lab 03/04/17 1206  INR 1.00   Cardiac Enzymes: No results for input(s): CKTOTAL, CKMB, CKMBINDEX, TROPONINI in the last 168 hours. BNP (last 3 results) No results for input(s): PROBNP in the last 8760 hours. HbA1C: No results for input(s): HGBA1C in the last 72 hours. CBG: No results for input(s): GLUCAP in the last 168 hours. Lipid Profile: No results for input(s): CHOL, HDL, LDLCALC, TRIG, CHOLHDL, LDLDIRECT in the last 72 hours. Thyroid Function Tests: No results for input(s): TSH, T4TOTAL, FREET4, T3FREE, THYROIDAB in the last 72 hours. Anemia Panel: No results for input(s): VITAMINB12, FOLATE, FERRITIN, TIBC, IRON, RETICCTPCT in the last 72 hours. Urine analysis:    Component Value Date/Time   COLORURINE YELLOW 11/24/2016 1803   APPEARANCEUR Cloudy (A) 01/09/2017 1536   LABSPEC 1.012 11/24/2016 1803   PHURINE 6.0 11/24/2016 1803   GLUCOSEU Negative 01/09/2017 1536   HGBUR NEGATIVE 11/24/2016 1803   BILIRUBINUR Negative 01/09/2017 [place] 11/24/2016 1803   PROTEINUR 3+ (A) 01/09/2017 1536   PROTEINUR 100 (A) 11/24/2016 1803   UROBILINOGEN negative 07/02/2015 1505   UROBILINOGEN 0.2 05/10/2015 1245   NITRITE Negative 01/09/2017 1536   NITRITE NEGATIVE 11/24/2016 1803   LEUKOCYTESUR 3+ (A) 01/09/2017 1536    Radiological Exams on Admission: Ct Abdomen Pelvis Wo Contrast  Result Date: 03/04/2017 CLINICAL DATA:  Chest pain and nausea. EXAM: CT CHEST, ABDOMEN AND PELVIS  WITHOUT CONTRAST TECHNIQUE: Multidetector CT imaging of the chest, abdomen and pelvis was performed following the standard protocol without IV contrast. COMPARISON:  CT abdomen dated 05/14/2013. FINDINGS: CT CHEST FINDINGS Cardiovascular: Aortic atherosclerosis. No aortic aneurysm. Heart size is upper normal. Coronary artery calcifications throughout. Mediastinum/Nodes: Walls of the esophagus appear somewhat thickened throughout. No mass or enlarged lymph nodes appreciated within the mediastinum or perihilar regions. Trachea and central bronchi are unremarkable. Lungs/Pleura: Mild atelectasis/scarring at each lung base. Lungs otherwise clear. No  pleural effusion or pneumothorax. Musculoskeletal: Degenerative changes within the thoracic spine, mild to moderate in degree. No acute or suspicious osseous finding. CT ABDOMEN PELVIS FINDINGS Hepatobiliary: Gallbladder is moderately distended but otherwise unremarkable with no evidence of acute cholecystitis. No bile duct dilatation. No focal liver abnormality. Pancreas: Unremarkable. No pancreatic ductal dilatation or surrounding inflammatory changes. Spleen: Normal in size without focal abnormality. Adrenals/Urinary Tract: Adrenal glands are unremarkable. Left renal parapelvic cyst versus extrarenal pelves. No hydronephrosis bilaterally. No ureteral or bladder calculi identified. Stomach/Bowel: Fairly extensive diverticulosis of the descending and sigmoid colon. Scattered additional diverticulosis within the transverse colon. No focal inflammatory change to suggest acute diverticulitis. No dilated large or small bowel loops. Moderate-sized stool ball in the rectal vault. Appendix is not convincingly seen but there are no inflammatory changes about the cecum to suggest acute appendicitis. Vascular/Lymphatic: Aortic atherosclerosis. No enlarged lymph nodes within the abdomen or pelvis. Reproductive: Exophytic uterine fibroid, left fundal region, measuring approximately 4  cm greatest dimension. Otherwise unremarkable. Other: No free fluid or abscess collection. No free intraperitoneal air. Musculoskeletal: Degenerative changes throughout the lumbar spine, mild to moderate in degree. No acute or suspicious osseous finding. IMPRESSION: 1. Walls of the esophagus appear somewhat thickened throughout suggesting a diffuse esophagitis of infectious or inflammatory nature. 2. Mild atelectasis/scarring at each lung base. No evidence of pneumonia. 3. Diffuse coronary artery calcifications. Recommend correlation with any possible associated cardiac symptoms. 4. Aortic atherosclerosis. 5. Colonic diverticulosis without evidence of acute diverticulitis. No bowel obstruction. 6. Uterine fibroid measuring approximately 4 cm. Electronically Signed   By: Franki Cabot M.D.   On: 03/04/2017 15:31   Dg Chest 2 View  Result Date: 03/04/2017 CLINICAL DATA:  83 year old female with a history of chest pain EXAM: CHEST  2 VIEW COMPARISON:  11/24/2016, 11/17/2016 FINDINGS: Right rotation of the patient, somewhat limits the evaluation. Cardiomediastinal silhouette likely unchanged. Right hilar region is accentuated likely secondary to the rotation of the patient. Calcifications of the aorta. Low lung volumes. No pleural effusion or pneumothorax. Coronary calcifications. No confluent airspace disease. Coarsened interstitial markings, similar to prior. IMPRESSION: Low lung volumes and chronic lung changes without evidence of acute cardiopulmonary disease. Aortic atherosclerosis and coronary disease. Electronically Signed   By: Corrie Mckusick D.O.   On: 03/04/2017 12:54   Ct Chest Wo Contrast  Result Date: 03/04/2017 CLINICAL DATA:  Chest pain and nausea. EXAM: CT CHEST, ABDOMEN AND PELVIS WITHOUT CONTRAST TECHNIQUE: Multidetector CT imaging of the chest, abdomen and pelvis was performed following the standard protocol without IV contrast. COMPARISON:  CT abdomen dated 05/14/2013. FINDINGS: CT CHEST  FINDINGS Cardiovascular: Aortic atherosclerosis. No aortic aneurysm. Heart size is upper normal. Coronary artery calcifications throughout. Mediastinum/Nodes: Walls of the esophagus appear somewhat thickened throughout. No mass or enlarged lymph nodes appreciated within the mediastinum or perihilar regions. Trachea and central bronchi are unremarkable. Lungs/Pleura: Mild atelectasis/scarring at each lung base. Lungs otherwise clear. No pleural effusion or pneumothorax. Musculoskeletal: Degenerative changes within the thoracic spine, mild to moderate in degree. No acute or suspicious osseous finding. CT ABDOMEN PELVIS FINDINGS Hepatobiliary: Gallbladder is moderately distended but otherwise unremarkable with no evidence of acute cholecystitis. No bile duct dilatation. No focal liver abnormality. Pancreas: Unremarkable. No pancreatic ductal dilatation or surrounding inflammatory changes. Spleen: Normal in size without focal abnormality. Adrenals/Urinary Tract: Adrenal glands are unremarkable. Left renal parapelvic cyst versus extrarenal pelves. No hydronephrosis bilaterally. No ureteral or bladder calculi identified. Stomach/Bowel: Fairly extensive diverticulosis of the descending and sigmoid colon. Scattered  additional diverticulosis within the transverse colon. No focal inflammatory change to suggest acute diverticulitis. No dilated large or small bowel loops. Moderate-sized stool ball in the rectal vault. Appendix is not convincingly seen but there are no inflammatory changes about the cecum to suggest acute appendicitis. Vascular/Lymphatic: Aortic atherosclerosis. No enlarged lymph nodes within the abdomen or pelvis. Reproductive: Exophytic uterine fibroid, left fundal region, measuring approximately 4 cm greatest dimension. Otherwise unremarkable. Other: No free fluid or abscess collection. No free intraperitoneal air. Musculoskeletal: Degenerative changes throughout the lumbar spine, mild to moderate in degree.  No acute or suspicious osseous finding. IMPRESSION: 1. Walls of the esophagus appear somewhat thickened throughout suggesting a diffuse esophagitis of infectious or inflammatory nature. 2. Mild atelectasis/scarring at each lung base. No evidence of pneumonia. 3. Diffuse coronary artery calcifications. Recommend correlation with any possible associated cardiac symptoms. 4. Aortic atherosclerosis. 5. Colonic diverticulosis without evidence of acute diverticulitis. No bowel obstruction. 6. Uterine fibroid measuring approximately 4 cm. Electronically Signed   By: Franki Cabot M.D.   On: 03/04/2017 15:31    EKG: Independently reviewed. LBBB chronic  Assessment/Plan Principal Problem:   Chest pain Active Problems:   Hyperlipidemia   DIASTOLIC HEART FAILURE, CHRONIC   Cerebrovascular disease   Uncontrolled hypertension   OA (osteoarthritis)   NSVT (nonsustained ventricular tachycardia) (HCC)   Overweight   HTN (hypertension)   Benign paroxysmal positional vertigo   CAD S/P PCI '03 and '08   Anemia in chronic kidney disease   Insulin dependent diabetes mellitus with complications (HCC)   Chronic kidney disease, stage IV (severe) (HCC)   PAF (paroxysmal atrial fibrillation) ([place])   #1 chest Pain  Patient presenting with nausea vomiting midsternal chest pain with some atypical features of some sharpness and associated burning sensation. Likely GI versus acute coronary syndrome. Patient with a prior cardiac history during last hospitalization in December 2017 was being treated for a non-STEMI. Patient at that time declined cardiac catheterization and cardiology recommended medical management. Will cycle cardiac enzymes every 63. Patient would recent 2-D echo done and a such will hold off on repeating 2-D echo. Check a TSH. Patient on a nitroglycerin patch which will continue. Will discontinue Protonix drip and place on Protonix 40 mg IV every 12 hours. Place on sucralfate every 6 hours. Gentle  hydration. Tums as needed. Patient may benefit from a upper endoscopy for further evaluation. Consulted with [place] gastro[facility]ology who will assess the patient. Will keep patient nothing by mouth for now. Follow.  #2 hypertension Continue current regimen of Norvasc.  #4 chronic kidney disease stage IV Stable. Monitor closely for volume overload.  #5 paroxysmal atrial fibrillation Will place patient on IV Lopressor due to active vomiting. Aspirin daily.  #6 insulin-dependent diabetes mellitus Check a hemoglobin A1c. Place on sliding scale insulin.  #7 anemia of chronic disease Taken anemia panel. Follow H&H. Transfusion threshold hemoglobin less than 7.  #8 hypertension Resume home regimen of antihypertensive medications.  #9 hyperlipidemia Check a fasting lipid panel. Continue statin.  #10 coronary artery disease/Chronic CHF See problem #1. Continue home regimen of Norvasc, aspirin, clonidine, IV Lopressor, nitroglycerin patch. Hold diuretics.  DVT prophylaxis: Heparin Code Status: DO NOT RESUSCITATE Family Communication: Updated patient. No family at bedside. Disposition Plan: Home when medically stable with resolution of nausea vomiting and chest pain. Consults called: GI: Dr.Nandigam Admission status: Placed in observation in step down unit.   [facility] MD [facility] Pager [phone]  If 7PM-7AM, please contact night-coverage [email or web address] Password [email or web address]  03/04/2017, 5:45 PM  [phone]

## 2017-03-04 NOTE — ED Provider Notes (Signed)
[facility] DEPT Provider Note   CSN: [id number] Arrival date & time: 03/04/17  1118     History   Chief Complaint Chief Complaint  Patient presents with  . Chest Pain    HPI [name] is a 83 y.o. female.  HPI Patient states she has pain in the center of her lower chest that is sharp and intense. She reports this started fairly quickly this morning. She denies any radiation of pain. None to the back or arms. She reports it made her feel nauseated and she has vomited twice. She denies any significant change with position change. Patient reports she had similar pain in December when she was admitted to the hospital for heart problems. Patient reports she ate a big meal Friday morning and did not have any problems with pain subsequent during the day. She reports she didn't eat in the evening with does not atypical she has a large daytime meal. She reports this morning she already out like she had some pain but she did try to eat some Cheerios. She then went on to have 2 episodes of vomiting. Patient states at baseline she sits in a chair all day because of chronic leg weakness and gait instability. She reports yesterday with a typical day for her. Past Medical History:  Diagnosis Date  . CAD (coronary artery disease) 2003   Last catheterization 2008. Two-vessel PCI of the first OM branch and mid LAD.  Marland Kitchen CKD (chronic kidney disease)   . Congestive heart failure ([place])   . CVA (cerebral vascular accident) ([place]) [place]   . Dementia   . DJD (degenerative joint disease) of lumbar spine   . Hypercholesteremia   . Hypertension   . IDDM (insulin dependent diabetes mellitus) [facility])     Patient Active Problem List   Diagnosis Date Noted  . Occult blood in stools 11/29/2016  . UTI (urinary tract infection) 11/29/2016  . Urinary retention 11/29/2016  . Rapid atrial fibrillation ([place])   . AKI (acute kidney injury) ([place])   . PAF (paroxysmal atrial fibrillation) ([place])   .  Pressure injury of skin 11/22/2016  . ACS (acute coronary syndrome) ([place]) 11/17/2016  . Acute on chronic diastolic CHF (congestive heart failure), NYHA class 4 ([place]) 11/17/2016  . Aortic valve sclerosis-2D June 2016 10/05/2016  . Chest pain with high risk of acute coronary syndrome 10/05/2016  . 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 Pulmonary hypertension   . Insulin dependent diabetes mellitus with complications ([place])   . Chronic kidney disease, stage IV (severe) ([place])   . Other constipation   . Failure to thrive in adult   . Hypomagnesemia   . CAD S/P PCI '03 and '08 05/11/2015  . Anemia in chronic kidney disease 05/11/2015  . Angina at rest [facility]) 05/09/2015  . Benign paroxysmal positional vertigo 03/21/2014  . HTN (hypertension) 08/09/2013  . Lumbar spondylosis 05/16/2013  . Overweight 05/15/2013  . Varicose veins of lower extremities with other complications 37/08/6268  . Normocytic anemia 10/29/2012  . Hypoglycemia 10/29/2012  . NSVT (nonsustained ventricular tachycardia) ([place]) 10/28/2012  . OA (osteoarthritis) 10/27/2012  . Uncontrolled hypertension 10/26/2011  . DIASTOLIC HEART FAILURE, CHRONIC 07/20/2010  . Cerebrovascular disease 03/09/2010  . Hyperlipidemia 12/05/2008    Past Surgical History:  Procedure Laterality Date  . APPENDECTOMY    . KNEE SURGERY      OB History    No data available       Home Medications  Prior to Admission medications   Medication Sig Start Date End Date Taking? Authorizing Provider  amLODipine (NORVASC) 10 MG tablet TAKE 1 TABLET DAILY 02/28/17  Yes Timmothy Euler, MD  aspirin EC 81 MG EC tablet Take 1 tablet (81 mg total) by mouth daily. 10/08/16  Yes Bhavinkumar Bhagat, PA  carvedilol (COREG) 6.25 MG tablet Take 1 tablet (6.25 mg total) by mouth 2 (two) times daily. 03/03/17  Yes Timmothy Euler, MD  cholecalciferol (VITAMIN D) 1000 units tablet Take 1,000 Units by mouth daily.   Yes Historical Provider, MD  cloNIDine  (CATAPRES) 0.2 MG tablet Take 1 tablet (0.2 mg total) by mouth 3 (three) times daily. 10/27/16  Yes Timmothy Euler, MD  diclofenac sodium (VOLTAREN) 1 % GEL Apply 4 g topically 4 (four) times daily. 07/29/16  Yes Timmothy Euler, MD  HYDROcodone-acetaminophen (NORCO) 5-325 MG tablet Take 1 tablet by mouth every 6 (six) hours as needed for moderate pain. 12/08/16  Yes Timmothy Euler, MD  insulin NPH Human (HUMULIN N) 100 UNIT/ML injection Inject 0.1 mLs (10 Units total) into the skin 2 (two) times daily before a meal. 02/03/17  Yes Timmothy Euler, MD  isosorbide mononitrate (IMDUR) 120 MG 24 hr tablet TAKE 1 TABLET DAILY 03/02/17  Yes Timmothy Euler, MD  levothyroxine (SYNTHROID, LEVOTHROID) 50 MCG tablet TAKE 1 TABLET DAILY 12/23/16  Yes Timmothy Euler, MD  nitroGLYCERIN (NITROSTAT) 0.4 MG SL tablet PLACE 1 TABLET UNDER THE TONGUE AT ONSET OF CHEST PAIN EVERY 5 MINTUES UP TO 3 TIMES AS NEEDED 02/27/17  Yes Timmothy Euler, MD  polyethylene glycol powder (GLYCOLAX/MIRALAX) powder MIX 1 CAPFUL (17 GRAMS) WITH 8OZ OF WATER OR JUICE DAILY. 12/13/16  Yes Timmothy Euler, MD  rosuvastatin (CRESTOR) 10 MG tablet Take 1 tablet (10 mg total) by mouth daily. 10/17/16  Yes Timmothy Euler, MD  torsemide (DEMADEX) 20 MG tablet Take 1 tablet (20 mg total) by mouth daily. Patient taking differently: Take 20 mg by mouth 3 (three) times daily.  11/08/16  Yes Timmothy Euler, MD  ACCU-CHEK AVIVA PLUS test strip CHECK BLOOD SUGER UP TO 3 TIMES A DAY 02/27/17   Timmothy Euler, MD  amoxicillin-clavulanate (AUGMENTIN) 500-125 MG tablet Take 1 tablet (500 mg total) by mouth 2 (two) times daily. Patient not taking: Reported on 03/04/2017 01/11/17   Timmothy Euler, MD  cephALEXin (KEFLEX) 250 MG capsule Take 1 capsule (250 mg total) by mouth daily. Patient not taking: Reported on 03/04/2017 01/10/17   Timmothy Euler, MD  Insulin Syringe-Needle U-100 27G X 1/2" 1 ML MISC Use as needed 02/03/17   Timmothy Euler, MD  SENEXON-S 8.6-50 MG tablet TAKE 4 TABLETS BY MOUTH 3 TIMES A DAY UNTIL BOWEL MOVEMENT. Patient not taking: Reported on 03/04/2017 12/13/16   Timmothy Euler, MD    Family History Family History  Problem Relation Age of Onset  . Heart attack Mother 85  . Heart attack Father 71  . Diabetes Brother     RETINOPATHY   . Drug abuse Brother   . Liver cancer Brother   . Breast cancer Daughter   . Diabetes Sister     Social History Social History  Substance Use Topics  . Smoking status: Never Smoker  . Smokeless tobacco: Never Used  . Alcohol use No     Allergies   Propoxyphene n-acetaminophen and Simvastatin   Review of Systems Review of Systems 10 Systems reviewed and  are negative for acute change except as noted in the HPI.   Physical Exam Updated Vital Signs BP (!) 165/52   Pulse 80   Temp 97.7 F (36.5 C) (Oral)   Resp 12   SpO2 97%   Physical Exam  Constitutional: She appears well-developed and well-nourished.  Patient is alert and nontoxic without respiratory distress. She is uncomfortable in appearance. She is holding an emesis bag with small amount of particulate emesis in the bottom of it.  HENT:  Head: Normocephalic and atraumatic.  Mouth/Throat: Oropharynx is clear and moist.  Eyes: Conjunctivae and EOM are normal.  Neck: Neck supple.  Cardiovascular: Normal rate, regular rhythm, normal heart sounds and intact distal pulses.   No murmur heard. Pulmonary/Chest: Effort normal and breath sounds normal. No respiratory distress.  Abdominal: Soft. Bowel sounds are normal. She exhibits no distension. There is no tenderness. There is no guarding.  Musculoskeletal: She exhibits no edema or tenderness.  Patient seems to have some muscular atrophy of the lower extremities but no gross edema or calf tenderness or popliteal fossa tenderness.  Neurological: She is alert.  Skin: Skin is warm and dry.  Psychiatric: She has a normal mood and affect.    Nursing note and vitals reviewed.    ED Treatments / Results  Labs (all labs ordered are listed, but only abnormal results are displayed) Labs Reviewed  COMPREHENSIVE METABOLIC PANEL - Abnormal; Notable for the following:       Result Value   CO2 21 (*)    Glucose, Bld 205 (*)    BUN 68 (*)    Creatinine, Ser 3.05 (*)    Total Protein 6.4 (*)    Albumin 3.4 (*)    AST 12 (*)    ALT 9 (*)    GFR calc non Af Amer 13 (*)    GFR calc Af Amer 15 (*)    All other components within normal limits  CBC WITH DIFFERENTIAL/PLATELET - Abnormal; Notable for the following:    RBC 2.95 (*)    Hemoglobin 8.6 (*)    HCT 25.9 (*)    All other components within normal limits  LIPASE, BLOOD  PROTIME-INR  I-STAT TROPOININ, ED  I-STAT TROPOININ, ED    EKG  EKG Interpretation None       Radiology Ct Abdomen Pelvis Wo Contrast  Result Date: 03/04/2017 CLINICAL DATA:  Chest pain and nausea. EXAM: CT CHEST, ABDOMEN AND PELVIS WITHOUT CONTRAST TECHNIQUE: Multidetector CT imaging of the chest, abdomen and pelvis was performed following the standard protocol without IV contrast. COMPARISON:  CT abdomen dated 05/14/2013. FINDINGS: CT CHEST FINDINGS Cardiovascular: Aortic atherosclerosis. No aortic aneurysm. Heart size is upper normal. Coronary artery calcifications throughout. Mediastinum/Nodes: Walls of the esophagus appear somewhat thickened throughout. No mass or enlarged lymph nodes appreciated within the mediastinum or perihilar regions. Trachea and central bronchi are unremarkable. Lungs/Pleura: Mild atelectasis/scarring at each lung base. Lungs otherwise clear. No pleural effusion or pneumothorax. Musculoskeletal: Degenerative changes within the thoracic spine, mild to moderate in degree. No acute or suspicious osseous finding. CT ABDOMEN PELVIS FINDINGS Hepatobiliary: Gallbladder is moderately distended but otherwise unremarkable with no evidence of acute cholecystitis. No bile duct dilatation.  No focal liver abnormality. Pancreas: Unremarkable. No pancreatic ductal dilatation or surrounding inflammatory changes. Spleen: Normal in size without focal abnormality. Adrenals/Urinary Tract: Adrenal glands are unremarkable. Left renal parapelvic cyst versus extrarenal pelves. No hydronephrosis bilaterally. No ureteral or bladder calculi identified. Stomach/Bowel: Fairly extensive diverticulosis of the descending  and sigmoid colon. Scattered additional diverticulosis within the transverse colon. No focal inflammatory change to suggest acute diverticulitis. No dilated large or small bowel loops. Moderate-sized stool ball in the rectal vault. Appendix is not convincingly seen but there are no inflammatory changes about the cecum to suggest acute appendicitis. Vascular/Lymphatic: Aortic atherosclerosis. No enlarged lymph nodes within the abdomen or pelvis. Reproductive: Exophytic uterine fibroid, left fundal region, measuring approximately 4 cm greatest dimension. Otherwise unremarkable. Other: No free fluid or abscess collection. No free intraperitoneal air. Musculoskeletal: Degenerative changes throughout the lumbar spine, mild to moderate in degree. No acute or suspicious osseous finding. IMPRESSION: 1. Walls of the esophagus appear somewhat thickened throughout suggesting a diffuse esophagitis of infectious or inflammatory nature. 2. Mild atelectasis/scarring at each lung base. No evidence of pneumonia. 3. Diffuse coronary artery calcifications. Recommend correlation with any possible associated cardiac symptoms. 4. Aortic atherosclerosis. 5. Colonic diverticulosis without evidence of acute diverticulitis. No bowel obstruction. 6. Uterine fibroid measuring approximately 4 cm. Electronically Signed   By: Franki Cabot M.D.   On: 03/04/2017 15:31   Dg Chest 2 View  Result Date: 03/04/2017 CLINICAL DATA:  83 year old female with a history of chest pain EXAM: CHEST  2 VIEW COMPARISON:  11/24/2016, 11/17/2016  FINDINGS: Right rotation of the patient, somewhat limits the evaluation. Cardiomediastinal silhouette likely unchanged. Right hilar region is accentuated likely secondary to the rotation of the patient. Calcifications of the aorta. Low lung volumes. No pleural effusion or pneumothorax. Coronary calcifications. No confluent airspace disease. Coarsened interstitial markings, similar to prior. IMPRESSION: Low lung volumes and chronic lung changes without evidence of acute cardiopulmonary disease. Aortic atherosclerosis and coronary disease. Electronically Signed   By: Corrie Mckusick D.O.   On: 03/04/2017 12:54   Ct Chest Wo Contrast  Result Date: 03/04/2017 CLINICAL DATA:  Chest pain and nausea. EXAM: CT CHEST, ABDOMEN AND PELVIS WITHOUT CONTRAST TECHNIQUE: Multidetector CT imaging of the chest, abdomen and pelvis was performed following the standard protocol without IV contrast. COMPARISON:  CT abdomen dated 05/14/2013. FINDINGS: CT CHEST FINDINGS Cardiovascular: Aortic atherosclerosis. No aortic aneurysm. Heart size is upper normal. Coronary artery calcifications throughout. Mediastinum/Nodes: Walls of the esophagus appear somewhat thickened throughout. No mass or enlarged lymph nodes appreciated within the mediastinum or perihilar regions. Trachea and central bronchi are unremarkable. Lungs/Pleura: Mild atelectasis/scarring at each lung base. Lungs otherwise clear. No pleural effusion or pneumothorax. Musculoskeletal: Degenerative changes within the thoracic spine, mild to moderate in degree. No acute or suspicious osseous finding. CT ABDOMEN PELVIS FINDINGS Hepatobiliary: Gallbladder is moderately distended but otherwise unremarkable with no evidence of acute cholecystitis. No bile duct dilatation. No focal liver abnormality. Pancreas: Unremarkable. No pancreatic ductal dilatation or surrounding inflammatory changes. Spleen: Normal in size without focal abnormality. Adrenals/Urinary Tract: Adrenal glands are  unremarkable. Left renal parapelvic cyst versus extrarenal pelves. No hydronephrosis bilaterally. No ureteral or bladder calculi identified. Stomach/Bowel: Fairly extensive diverticulosis of the descending and sigmoid colon. Scattered additional diverticulosis within the transverse colon. No focal inflammatory change to suggest acute diverticulitis. No dilated large or small bowel loops. Moderate-sized stool ball in the rectal vault. Appendix is not convincingly seen but there are no inflammatory changes about the cecum to suggest acute appendicitis. Vascular/Lymphatic: Aortic atherosclerosis. No enlarged lymph nodes within the abdomen or pelvis. Reproductive: Exophytic uterine fibroid, left fundal region, measuring approximately 4 cm greatest dimension. Otherwise unremarkable. Other: No free fluid or abscess collection. No free intraperitoneal air. Musculoskeletal: Degenerative changes throughout the lumbar spine, mild to moderate  in degree. No acute or suspicious osseous finding. IMPRESSION: 1. Walls of the esophagus appear somewhat thickened throughout suggesting a diffuse esophagitis of infectious or inflammatory nature. 2. Mild atelectasis/scarring at each lung base. No evidence of pneumonia. 3. Diffuse coronary artery calcifications. Recommend correlation with any possible associated cardiac symptoms. 4. Aortic atherosclerosis. 5. Colonic diverticulosis without evidence of acute diverticulitis. No bowel obstruction. 6. Uterine fibroid measuring approximately 4 cm. Electronically Signed   By: Franki Cabot M.D.   On: 03/04/2017 15:31    Procedures Procedures (including critical care time) CRITICAL CARE Performed by: Charlesetta Shanks   Total critical care time: 30 minutes  Critical care time was exclusive of separately billable procedures and treating other patients.  Critical care was necessary to treat or prevent imminent or life-threatening deterioration.  Critical care was time spent personally  by me on the following activities: development of treatment plan with patient and/or surrogate as well as nursing, discussions with consultants, evaluation of patient's response to treatment, examination of patient, obtaining history from patient or surrogate, ordering and performing treatments and interventions, ordering and review of laboratory studies, ordering and review of radiographic studies, pulse oximetry and re-evaluation of patient's condition. Medications Ordered in ED Medications  aspirin EC tablet 81 mg (81 mg Oral Not Given 03/04/17 1204)  pantoprazole (PROTONIX) 80 mg in sodium chloride 0.9 % 250 mL (0.32 mg/mL) infusion (not administered)  morphine 4 MG/ML injection 4 mg (not administered)  0.9 %  sodium chloride infusion (not administered)  gi cocktail (Maalox,Lidocaine,Donnatal) (not administered)  pantoprazole (PROTONIX) injection 40 mg (40 mg Intravenous Given 03/04/17 1157)  ondansetron (ZOFRAN) injection 4 mg (4 mg Intravenous Given 03/04/17 1157)  nitroGLYCERIN (NITROGLYN) 2 % ointment 1 inch (1 inch Topical Given 03/04/17 1247)  morphine 4 MG/ML injection 4 mg (4 mg Intravenous Given 03/04/17 1343)  ondansetron (ZOFRAN) injection 4 mg (4 mg Intravenous Given 03/04/17 1435)  morphine 4 MG/ML injection 4 mg (4 mg Intravenous Given 03/04/17 1530)  gi cocktail (Maalox,Lidocaine,Donnatal) (30 mLs Oral Given 03/04/17 1610)     Initial Impression / Assessment and Plan / ED Course  I have reviewed the triage vital signs and the nursing notes.  Pertinent labs & imaging results that were available during my care of the patient were reviewed by me and considered in my medical decision making (see chart for details).     Consult: Dr. Grandville Silos for admission.  Final Clinical Impressions(s) / ED Diagnoses   Final diagnoses:  Precordial pain  Esophagitis   Patient is having recurrent and intractable pain. Recurrent episodes of dry heaves and emesis. CT does show esophageal thickening.  Clinical picture is highly suggestive of GI pain consistent with esophagitis. This is been very difficult to get pain control for the patient. Patient will be admitted on Protonix drip and narcotic pain medication as needed. 2 sets of cardiac enzymes are negative at this time. Patient does however have known coronary artery disease and risk for ischemic event. New Prescriptions New Prescriptions   No medications on file     Charlesetta Shanks, MD 03/08/17 1102

## 2017-03-04 NOTE — ED Notes (Signed)
Patient transported to CT 

## 2017-03-04 NOTE — ED Triage Notes (Signed)
Pt arrives from home via [facility]. Pt states that at 0800 this morning she began having centralized CP with radiation to arms bilaterally and nausea, took 3 nitro with no relief and then called EMS. EMS reports they gave her 1 nitro and 4mg  of morphine which decreased pain from a 10/10 to a 6/10. Pt is in first degree heart block.

## 2017-03-04 NOTE — ED Notes (Signed)
MD at bedside updating patient.

## 2017-03-05 DIAGNOSIS — I495 Sick sinus syndrome: Secondary | ICD-10-CM

## 2017-03-05 DIAGNOSIS — Z794 Long term (current) use of insulin: Secondary | ICD-10-CM | POA: Diagnosis not present

## 2017-03-05 DIAGNOSIS — E785 Hyperlipidemia, unspecified: Secondary | ICD-10-CM | POA: Diagnosis not present

## 2017-03-05 DIAGNOSIS — R933 Abnormal findings on diagnostic imaging of other parts of digestive tract: Secondary | ICD-10-CM | POA: Diagnosis not present

## 2017-03-05 DIAGNOSIS — I10 Essential (primary) hypertension: Secondary | ICD-10-CM | POA: Diagnosis not present

## 2017-03-05 DIAGNOSIS — I480 Paroxysmal atrial fibrillation: Secondary | ICD-10-CM | POA: Diagnosis not present

## 2017-03-05 DIAGNOSIS — I5032 Chronic diastolic (congestive) heart failure: Secondary | ICD-10-CM | POA: Diagnosis not present

## 2017-03-05 DIAGNOSIS — E118 Type 2 diabetes mellitus with unspecified complications: Secondary | ICD-10-CM | POA: Diagnosis not present

## 2017-03-05 DIAGNOSIS — K209 Esophagitis, unspecified: Secondary | ICD-10-CM | POA: Diagnosis not present

## 2017-03-05 DIAGNOSIS — N184 Chronic kidney disease, stage 4 (severe): Secondary | ICD-10-CM | POA: Diagnosis not present

## 2017-03-05 DIAGNOSIS — R079 Chest pain, unspecified: Secondary | ICD-10-CM | POA: Diagnosis not present

## 2017-03-05 DIAGNOSIS — Z9861 Coronary angioplasty status: Secondary | ICD-10-CM | POA: Diagnosis not present

## 2017-03-05 DIAGNOSIS — I2510 Atherosclerotic heart disease of native coronary artery without angina pectoris: Secondary | ICD-10-CM | POA: Diagnosis not present

## 2017-03-05 LAB — CBC
HEMATOCRIT: 23.4 % — AB (ref 36.0–46.0)
Hemoglobin: 7.6 g/dL — ABNORMAL LOW (ref 12.0–15.0)
MCH: 28.9 pg (ref 26.0–34.0)
MCHC: 32.5 g/dL (ref 30.0–36.0)
MCV: 89 fL (ref 78.0–100.0)
Platelets: 208 10*3/uL (ref 150–400)
RBC: 2.63 MIL/uL — ABNORMAL LOW (ref 3.87–5.11)
RDW: 13 % (ref 11.5–15.5)
WBC: 5.2 10*3/uL (ref 4.0–10.5)

## 2017-03-05 LAB — URINALYSIS, ROUTINE W REFLEX MICROSCOPIC
Bilirubin Urine: NEGATIVE
Glucose, UA: 150 mg/dL — AB
Ketones, ur: 5 mg/dL — AB
Nitrite: NEGATIVE
PROTEIN: 100 mg/dL — AB
SPECIFIC GRAVITY, URINE: 1.014 (ref 1.005–1.030)
pH: 7 (ref 5.0–8.0)

## 2017-03-05 LAB — BASIC METABOLIC PANEL
Anion gap: 13 (ref 5–15)
BUN: 67 mg/dL — AB (ref 6–20)
CALCIUM: 9.8 mg/dL (ref 8.9–10.3)
CO2: 23 mmol/L (ref 22–32)
Chloride: 105 mmol/L (ref 101–111)
Creatinine, Ser: 3.03 mg/dL — ABNORMAL HIGH (ref 0.44–1.00)
GFR calc Af Amer: 15 mL/min — ABNORMAL LOW (ref >60)
GFR, EST NON AFRICAN AMERICAN: 13 mL/min — AB (ref >60)
GLUCOSE: 201 mg/dL — AB (ref 65–99)
Potassium: 5 mmol/L (ref 3.5–5.1)
Sodium: 141 mmol/L (ref 135–145)

## 2017-03-05 LAB — TROPONIN I: Troponin I: 0.05 ng/mL (ref <0.03)

## 2017-03-05 LAB — LIPID PANEL
CHOL/HDL RATIO: 2.9 ratio
Cholesterol: 106 mg/dL (ref 0–200)
HDL: 36 mg/dL — AB (ref >40)
LDL CALC: 40 mg/dL (ref 0–99)
Triglycerides: 150 mg/dL — ABNORMAL HIGH (ref <150)
VLDL: 30 mg/dL (ref 0–40)

## 2017-03-05 LAB — GLUCOSE, CAPILLARY: GLUCOSE-CAPILLARY: 213 mg/dL — AB (ref 65–99)

## 2017-03-05 MED ORDER — CARVEDILOL 3.125 MG PO TABS
3.1250 mg | ORAL_TABLET | Freq: Two times a day (BID) | ORAL | Status: DC
  Administered 2017-03-05 – 2017-03-09 (×7): 3.125 mg via ORAL
  Filled 2017-03-05 (×7): qty 1

## 2017-03-05 MED ORDER — HYDRALAZINE HCL 20 MG/ML IJ SOLN: 5.0000 mg | Freq: Four times a day (QID) | INTRAMUSCULAR | Status: DC | PRN

## 2017-03-05 MED ORDER — CLONIDINE HCL 0.1 MG PO TABS
0.1000 mg | ORAL_TABLET | Freq: Two times a day (BID) | ORAL | Status: DC
  Administered 2017-03-05 – 2017-03-09 (×8): 0.1 mg via ORAL
  Filled 2017-03-05 (×8): qty 1

## 2017-03-05 NOTE — Progress Notes (Signed)
Text paged [facility] regarding slightly elevated Troponins, elevated B/P, will give her IV Lopressor and a little nausea, she's C/O some epigastric discomfort but she has been vomiting, will give some Morphine and continue to monitor, Pt also hasn't had a BM for a few days and her urine is cloudy and foul smelling, will continue to monitor. Only orders given were to give her IV Lopressor for her elevated B/P which I was about to give, will continue to monitor.

## 2017-03-05 NOTE — Consult Note (Signed)
Reason for Consult:chest pain and long pauses in the setting of LBBB  Referring Physician: Dr. Elmore Guise is an 83 y.o. female.   HPI: The patient is an 83 yo woman with multiple medical problems who was admitted with more chest pain and HTN. She has been found to have multiple pauses lasting 5-6 seconds. She is on multiple medicines for her HTN and CAD including clonidine and low dose coreg. She has never had syncope and denies near syncope or dizzy spells. She had a NSTEMI about 4 months ago and refused a left heart cath. She has near endstage renal disease. Her ChF symptoms are class 2. No palpitations.   PMH: Past Medical History:  Diagnosis Date  . CAD (coronary artery disease) 2003   Last catheterization 2008. Two-vessel PCI of the first OM branch and mid LAD.  Marland Kitchen CKD (chronic kidney disease)   . Congestive heart failure ([place])   . CVA (cerebral vascular accident) ([place]) [place]   . Dementia   . DJD (degenerative joint disease) of lumbar spine   . Hypercholesteremia   . Hypertension   . IDDM (insulin dependent diabetes mellitus) ([place])     PSHX: Past Surgical History:  Procedure Laterality Date  . APPENDECTOMY    . KNEE SURGERY      FAMHX: Family History  Problem Relation Age of Onset  . Heart attack Mother 35  . Heart attack Father 58  . Diabetes Brother     RETINOPATHY   . Drug abuse Brother   . Liver cancer Brother   . Breast cancer Daughter   . Diabetes Sister     Social History:  reports that she has never smoked. She has never used smokeless tobacco. She reports that she does not drink alcohol or use drugs.  Allergies:  Allergies  Allergen Reactions  . Propoxyphene N-Acetaminophen     Pt does not know reaction  . Simvastatin Other (See Comments)    headache   ROS - all systems reviewed and negative except as noted above.  Medications: I have reviewed the patient's current medications.  Ct Abdomen Pelvis Wo Contrast  Result  Date: 03/04/2017 CLINICAL DATA:  Chest pain and nausea. EXAM: CT CHEST, ABDOMEN AND PELVIS WITHOUT CONTRAST TECHNIQUE: Multidetector CT imaging of the chest, abdomen and pelvis was performed following the standard protocol without IV contrast. COMPARISON:  CT abdomen dated 05/14/2013. FINDINGS: CT CHEST FINDINGS Cardiovascular: Aortic atherosclerosis. No aortic aneurysm. Heart size is upper normal. Coronary artery calcifications throughout. Mediastinum/Nodes: Walls of the esophagus appear somewhat thickened throughout. No mass or enlarged lymph nodes appreciated within the mediastinum or perihilar regions. Trachea and central bronchi are unremarkable. Lungs/Pleura: Mild atelectasis/scarring at each lung base. Lungs otherwise clear. No pleural effusion or pneumothorax. Musculoskeletal: Degenerative changes within the thoracic spine, mild to moderate in degree. No acute or suspicious osseous finding. CT ABDOMEN PELVIS FINDINGS Hepatobiliary: Gallbladder is moderately distended but otherwise unremarkable with no evidence of acute cholecystitis. No bile duct dilatation. No focal liver abnormality. Pancreas: Unremarkable. No pancreatic ductal dilatation or surrounding inflammatory changes. Spleen: Normal in size without focal abnormality. Adrenals/Urinary Tract: Adrenal glands are unremarkable. Left renal parapelvic cyst versus extrarenal pelves. No hydronephrosis bilaterally. No ureteral or bladder calculi identified. Stomach/Bowel: Fairly extensive diverticulosis of the descending and sigmoid colon. Scattered additional diverticulosis within the transverse colon. No focal inflammatory change to suggest acute diverticulitis. No dilated large or small bowel loops. Moderate-sized stool ball in the rectal vault.  Appendix is not convincingly seen but there are no inflammatory changes about the cecum to suggest acute appendicitis. Vascular/Lymphatic: Aortic atherosclerosis. No enlarged lymph nodes within the abdomen or  pelvis. Reproductive: Exophytic uterine fibroid, left fundal region, measuring approximately 4 cm greatest dimension. Otherwise unremarkable. Other: No free fluid or abscess collection. No free intraperitoneal air. Musculoskeletal: Degenerative changes throughout the lumbar spine, mild to moderate in degree. No acute or suspicious osseous finding. IMPRESSION: 1. Walls of the esophagus appear somewhat thickened throughout suggesting a diffuse esophagitis of infectious or inflammatory nature. 2. Mild atelectasis/scarring at each lung base. No evidence of pneumonia. 3. Diffuse coronary artery calcifications. Recommend correlation with any possible associated cardiac symptoms. 4. Aortic atherosclerosis. 5. Colonic diverticulosis without evidence of acute diverticulitis. No bowel obstruction. 6. Uterine fibroid measuring approximately 4 cm. Electronically Signed   By: Franki Cabot M.D.   On: 03/04/2017 15:31   Dg Chest 2 View  Result Date: 03/04/2017 CLINICAL DATA:  83 year old female with a history of chest pain EXAM: CHEST  2 VIEW COMPARISON:  11/24/2016, 11/17/2016 FINDINGS: Right rotation of the patient, somewhat limits the evaluation. Cardiomediastinal silhouette likely unchanged. Right hilar region is accentuated likely secondary to the rotation of the patient. Calcifications of the aorta. Low lung volumes. No pleural effusion or pneumothorax. Coronary calcifications. No confluent airspace disease. Coarsened interstitial markings, similar to prior. IMPRESSION: Low lung volumes and chronic lung changes without evidence of acute cardiopulmonary disease. Aortic atherosclerosis and coronary disease. Electronically Signed   By: Corrie Mckusick D.O.   On: 03/04/2017 12:54   Ct Chest Wo Contrast  Result Date: 03/04/2017 CLINICAL DATA:  Chest pain and nausea. EXAM: CT CHEST, ABDOMEN AND PELVIS WITHOUT CONTRAST TECHNIQUE: Multidetector CT imaging of the chest, abdomen and pelvis was performed following the standard  protocol without IV contrast. COMPARISON:  CT abdomen dated 05/14/2013. FINDINGS: CT CHEST FINDINGS Cardiovascular: Aortic atherosclerosis. No aortic aneurysm. Heart size is upper normal. Coronary artery calcifications throughout. Mediastinum/Nodes: Walls of the esophagus appear somewhat thickened throughout. No mass or enlarged lymph nodes appreciated within the mediastinum or perihilar regions. Trachea and central bronchi are unremarkable. Lungs/Pleura: Mild atelectasis/scarring at each lung base. Lungs otherwise clear. No pleural effusion or pneumothorax. Musculoskeletal: Degenerative changes within the thoracic spine, mild to moderate in degree. No acute or suspicious osseous finding. CT ABDOMEN PELVIS FINDINGS Hepatobiliary: Gallbladder is moderately distended but otherwise unremarkable with no evidence of acute cholecystitis. No bile duct dilatation. No focal liver abnormality. Pancreas: Unremarkable. No pancreatic ductal dilatation or surrounding inflammatory changes. Spleen: Normal in size without focal abnormality. Adrenals/Urinary Tract: Adrenal glands are unremarkable. Left renal parapelvic cyst versus extrarenal pelves. No hydronephrosis bilaterally. No ureteral or bladder calculi identified. Stomach/Bowel: Fairly extensive diverticulosis of the descending and sigmoid colon. Scattered additional diverticulosis within the transverse colon. No focal inflammatory change to suggest acute diverticulitis. No dilated large or small bowel loops. Moderate-sized stool ball in the rectal vault. Appendix is not convincingly seen but there are no inflammatory changes about the cecum to suggest acute appendicitis. Vascular/Lymphatic: Aortic atherosclerosis. No enlarged lymph nodes within the abdomen or pelvis. Reproductive: Exophytic uterine fibroid, left fundal region, measuring approximately 4 cm greatest dimension. Otherwise unremarkable. Other: No free fluid or abscess collection. No free intraperitoneal air.  Musculoskeletal: Degenerative changes throughout the lumbar spine, mild to moderate in degree. No acute or suspicious osseous finding. IMPRESSION: 1. Walls of the esophagus appear somewhat thickened throughout suggesting a diffuse esophagitis of infectious or inflammatory nature. 2. Mild atelectasis/scarring at  each lung base. No evidence of pneumonia. 3. Diffuse coronary artery calcifications. Recommend correlation with any possible associated cardiac symptoms. 4. Aortic atherosclerosis. 5. Colonic diverticulosis without evidence of acute diverticulitis. No bowel obstruction. 6. Uterine fibroid measuring approximately 4 cm. Electronically Signed   By: Franki Cabot M.D.   On: 03/04/2017 15:31    ROS  As stated in the HPI and negative for all other systems.  Physical Exam  Vitals:Blood pressure (!) 107/42, pulse (!) 59, temperature 97.9 F (36.6 C), temperature source Oral, resp. rate 18, height 5\' 4"  (1.626 m), weight 137 lb 12.6 oz (62.5 kg), SpO2 100 %.  stable appearing elderly woman, NAD HEENT: Unremarkable Neck:  6 cm JVD, no thyromegally Lymphatics:  No adenopathy Back:  No CVA tenderness Lungs:  Clear with no wheezes HEART:  Regular rate rhythm, no murmurs, no rubs, no clicks Abd:  soft, positive bowel sounds, no organomegally, no rebound, no guarding Ext:  2 plus pulses, no edema, no cyanosis, no clubbing Skin:  No rashes no nodules Neuro:  CN II through XII intact, motor grossly intact  ECG - nsr with LBBB   Tele - nsr with pauses of over 5 seconds.  CXR - reviewed  Assessment/Plan: 1. Asymptomatic pauses - the patient very clearly denies any symptoms associate with bradycardia. She is on both clonidine and low dose metoprolol. I would suggest she reduce her dose of clonidine to 0.1 mg twice daily. For now, continue the coreg. I have discussed a PPM. She is not interested in one at this time but might consider if she absolutely had to. Because of her lack of symptoms and  medications which are contributing, I would hope that we can avoid a PPM at this point. 2. HTN heart disease - with reduction of the clonidine, her blood pressure will need to be followed closely. Her renal disease makes treatment more difficult. 3. CAD - she is s/p PCI. She continues to refuse left heart cath.  4. Acute on chronic diastolic CHF - she appears to be class 2. She will maintain a low sodium diet.   Carleene Overlie TaylorMD 03/05/2017, 12:17 PM

## 2017-03-05 NOTE — Evaluation (Signed)
Physical Therapy Evaluation Patient Details Name: [name] MRN: [id number] DOB: 11-04-1932 Today's Date: 03/05/2017   History of Present Illness  83 y.o. female admitted with chest pain and HTN with multiple 5-6 second pauses. Pt had NSTEMI 4 months ago. PMH significant for CKD, CVA (left side), dementia and DM.   Clinical Impression  Pt admitted with above. Pt functioning near baseline. Pt functioning at minA level. Dtr was providing assist for dressing, bathing, and medicine management as well. Pt With good home set up but would recommend 24/7 assist for safety due to increased falls risk.    Follow Up Recommendations No PT follow up;Supervision/Assistance - 24 hour    Equipment Recommendations  None recommended by PT    Recommendations for Other Services       Precautions / Restrictions Precautions Precautions: Fall Restrictions Weight Bearing Restrictions: No      Mobility  Bed Mobility Overal bed mobility: Needs Assistance Bed Mobility: Supine to Sit     Supine to sit: Min assist     General bed mobility comments: pt reports she sleeps in a recliner. minA for trunk elevation and to scoot towards EOB  Transfers Overall transfer level: Needs assistance Equipment used: Rolling walker (2 wheeled) Transfers: Sit to/from Stand Sit to Stand: Min assist         General transfer comment: v/c's and tactile cues to push up from bed  Ambulation/Gait Ambulation/Gait assistance: Min assist Ambulation Distance (Feet): 75 Feet Assistive device: Rolling walker (2 wheeled) Gait Pattern/deviations: Decreased stride length;Drifts right/left;Trunk flexed;Narrow base of support Gait velocity: slow Gait velocity interpretation: Below normal speed for age/gender General Gait Details: minA for walker management, v/c's to stay close to walker  Stairs            Wheelchair Mobility    Modified Rankin (Stroke Patients Only)       Balance Overall balance  assessment: Needs assistance Sitting-balance support: No upper extremity supported;Feet supported Sitting balance-Leahy Scale: Fair Sitting balance - Comments: attempted to don socks however unable   Standing balance support: Bilateral upper extremity supported Standing balance-Leahy Scale: Poor Standing balance comment: requires rolling walker                             Pertinent Vitals/Pain Pain Assessment: No/denies pain    Home Living Family/patient expects to be discharged to:: Private residence Living Arrangements: Children Available Help at Discharge: Family;Available PRN/intermittently (works 2-4 hours) Type of Home: House Home Access: Level entry     Home Layout: One level Home Equipment: Environmental consultant - 2 wheels;Cane - single point;Bedside commode Additional Comments: lives with daughter who is unable to provide 24/7 assist    Prior Function Level of Independence: Needs assistance   Gait / Transfers Assistance Needed: amb with RW  ADL's / Homemaking Assistance Needed: asssist for cooking and dressing        Hand Dominance   Dominant Hand: Right    Extremity/Trunk Assessment   Upper Extremity Assessment Upper Extremity Assessment: Generalized weakness    Lower Extremity Assessment Lower Extremity Assessment: Generalized weakness    Cervical / Trunk Assessment Cervical / Trunk Assessment: Kyphotic  Communication   Communication: No difficulties  Cognition Arousal/Alertness: Awake/alert Behavior During Therapy: WFL for tasks assessed/performed Overall Cognitive Status: History of cognitive impairments - at baseline  General Comments: pt with h/o dementia      General Comments      Exercises     Assessment/Plan    PT Assessment Patient needs continued PT services  PT Problem List Decreased strength;Decreased activity tolerance;Decreased balance;Decreased mobility;Decreased knowledge of use of  DME;Decreased safety awareness       PT Treatment Interventions DME instruction;Gait training;Functional mobility training;Therapeutic activities;Therapeutic exercise;Balance training;Neuromuscular re-education;Patient/family education    PT Goals (Current goals can be found in the Care Plan section)  Acute Rehab PT Goals Patient Stated Goal: home PT Goal Formulation: With patient Time For Goal Achievement: 03/12/17 Potential to Achieve Goals: Good    Frequency Min 2X/week   Barriers to discharge Decreased caregiver support (alone during the day) daughter works part time    Building control surveyor of Glass blower/designer During Treatment: Gait belt Activity Tolerance: Patient tolerated treatment well Patient left: in chair;with call bell/phone within reach;with family/visitor present Nurse Communication: Mobility status (pt SpO2 >95% on RA) PT Visit Diagnosis: Unsteadiness on feet (R26.81);Difficulty in walking, not elsewhere classified (R26.2)    Time: 0388-8280 PT Time Calculation (min) (ACUTE ONLY): 32 min   Charges:   PT Evaluation $PT Eval Moderate Complexity: 1 Procedure PT Treatments $Gait Training: 8-22 mins   PT G Codes:   PT G-Codes **NOT FOR INPATIENT CLASS** Functional Assessment Tool Used: Clinical judgement Functional Limitation: Mobility: Walking and moving around Mobility: Walking and Moving Around Current Status (K3491): At least 1 percent but less than 20 percent impaired, limited or restricted Mobility: Walking and Moving Around Goal Status [id number]): At least 1 percent but less than 20 percent impaired, limited or restricted    Kittie Plater, PT, DPT Pager #: [phone] Office #: [phone]   [place] 03/05/2017, 4:23 PM

## 2017-03-05 NOTE — Plan of Care (Signed)
Problem: Safety: Goal: Ability to remain free from injury will improve Outcome: Progressing Patient instructed to use her call light or phone and was shown where the numbers for her RN/NT were on the white board and how to call, also made aware that she has a bed alarm on and why and patient verbalized understanding, all personal items on her bedside stand within her reach.

## 2017-03-05 NOTE — Progress Notes (Signed)
I came into room because I heard the bedside alarm ringing off. Pt was slumped over but breathing in her bed. I rubbed her and called her name. She woke up but said she feels "very sleepy." Monitor tech notified me of 3.17 second pause with one beat folloed by another 2.57 second pause. Then patient went to sinus brady in the 50s. I notified Dr. Grandville Silos, plan to consult cardiology. Will closely monitor but pt she is resting comfortably in her bed and alert and oriented

## 2017-03-05 NOTE — Progress Notes (Signed)
PROGRESS NOTE    [name]  YSA:[id number] DOB: 1932-02-16 DOA: 03/04/2017 PCP: Kenn File, MD    Brief Narrative:  Patient is a 83 year old female history of coronary artery disease, chronic kidney disease stage IV, hypertension, diabetes, atrial fibrillation, hyperlipidemia, history of CVA recent NSTEMI during hospitalization of 11/17/2016-11/29/2016 which was treated medically as patient had refused cardiac catheterization. Patient presenting with nausea vomiting midsternal chest pain radiating to bilateral upper extremities. Patient with typical and atypical symptoms of chest pain. Likely GI versus cardiac. Patient admitted for further workup. GI and cardiology consulted.   Assessment & Plan:   Principal Problem:   Chest pain Active Problems:   Hyperlipidemia   DIASTOLIC HEART FAILURE, CHRONIC   Cerebrovascular disease   Uncontrolled hypertension   OA (osteoarthritis)   NSVT (nonsustained ventricular tachycardia) (HCC)   Overweight   HTN (hypertension)   Benign paroxysmal positional vertigo   CAD S/P PCI '03 and '08   Anemia in chronic kidney disease   Insulin dependent diabetes mellitus with complications (HCC)   Chronic kidney disease, stage IV (severe) (HCC)   PAF (paroxysmal atrial fibrillation) (HCC)   Esophagitis   #1 chest pain Patient with significant extensive cardiac history recent NSTEMI December 2017 managed medically as patient had refused cardiac catheterization. Patient presented with chest pain with both typical and atypical features. Patient did complain of burning sensation however denied any odynophagia or dysphagia. Patient with some improvement with chest pain with GI cocktail. CT chest with diffuse esophagitis Cardiac enzymes minimally elevated. EKG with left bundle branch block, with some pronounced ST depression in leads V4 through V5. Patient with recent 2-D echo 11/17/2016 with EF of 65-70% with no regional wall motion abnormalities and a  such will not repeat 2-D echo. Patient has been seen in consultation by gastroenterology who feel at this time given patient's potential risks related to sedation and procedure as well as low likelihood you are recommending holding off on EGD, continue with empiric PPI and Carafate as well as cardiology consultation for further evaluation and management. GI feel may need to consider barium swallow to evaluate for any possible mucosal abnormalities. Continue PPI, Carafate. Consult with cardiology. Continue home regimen of cardiac medications of imdur, Norvasc, aspirin, clonidine, Crestor. Discontinue IV Lopressor. Resume half home dose Coreg. GI following.  #2 esophagitis Noted on CT chest. Patient has been evaluated by gastroenterology who at this point in time a holding off on the EGD secondary to patient's potential risk so sedation and low likelihood. Will get a barium swallow for further evaluation. Continue PPI. Continue sucralfate. Diet has been advanced per GI to a soft diet.  #3 chronic kidney disease stage IV Stable.  #4 hypertension Continue Norvasc, clonidine, imdur.  #5 bradycardia/sinus pauses Cardiac enzymes minimally elevated. Patient would recent 2-D echo 11/17/2016 and a such will not repeat 2-D echo. Discontinue IV Lopressor.  #6 paroxysmal atrial fibrillation Patient was on IV Lopressor however will discontinue secondary to sinus pauses noted on telemetry. Resume half home dose Coreg. Continue aspirin. Cardiology consultation pending.  #7 hyperlipidemia LDL at 40. Continue statin.  #8 insulin-dependent diabetes mellitus Hemoglobin A1c was 6.1 on 11/17/2016. CBGs was 213 this morning. Continue sliding scale insulin. Follow.  #9 coronary artery disease/recent NSTEMI/chronic CHF See problem #1. Continue home regimen of Norvasc, aspirin, clonidine,imdur. Continue to hold diuretics. Discontinue IV Lopressor secondary to bradycardia/sinus pauses. Resume half home dose Coreg.  Cardiology consultation.    DVT prophylaxis: Heparin Code Status: DO NOT RESUSCITATE Family  Communication: Updated patient. No family at bedside. Disposition Plan: Home once chest pain resolved, sinus pauses evaluated and further evaluation per cardiology and GI.   Consultants:   Gastroenterology: Dr.Nandigam 03/05/2017  Cardiology pending  Procedures:   CT chest/CT abdomen and pelvis 03/04/2017  Chest x-ray 03/04/2017  Antimicrobials:   None   Subjective: Patient sitting up in bed. Patient had chest pain earlier on this morning however was given some GI cocktail with improvement. Vision denies any shortness of breath. Patient asking for food. Patient noted to have a 3.14 second pause then 1 beat then 2.5 second pause as well as bradycardia, per nursing this morning.  Objective: Vitals:   03/05/17 0447 03/05/17 0737 03/05/17 0858 03/05/17 1055  BP: (!) 181/61 (!) 154/65 (!) 174/111 (!) 107/42  Pulse: 87 74  (!) 59  Resp:      Temp: 97.9 F (36.6 C)     TempSrc: Oral     SpO2: 100% 99%  100%  Weight: 62.5 kg (137 lb 12.6 oz)     Height:        Intake/Output Summary (Last 24 hours) at 03/05/17 1116 Last data filed at 03/05/17 0600  Gross per 24 hour  Intake           995.42 ml  Output              650 ml  Net           345.42 ml   Filed Weights   03/04/17 1748 03/05/17 0447  Weight: 62.5 kg (137 lb 12.6 oz) 62.5 kg (137 lb 12.6 oz)    Examination:  General exam: Appears calm and comfortable  Respiratory system: Clear to auscultation. Respiratory effort normal. Cardiovascular system: S1 & S2 heard, RRR. No JVD, murmurs, rubs, gallops or clicks. No pedal edema. Gastrointestinal system: Abdomen is nondistended, soft and nontender. No organomegaly or masses felt. Normal bowel sounds heard. Central nervous system: Alert and oriented. No focal neurological deficits. Extremities: Symmetric 5 x 5 power. Skin: No rashes, lesions or ulcers Psychiatry: Judgement  and insight appear fair. Mood & affect appropriate.     Data Reviewed: I have personally reviewed following labs and imaging studies  CBC:  Recent Labs Lab 03/04/17 1206 03/05/17 0406  WBC 5.6 5.2  NEUTROABS 4.2  --   HGB 8.6* 7.6*  HCT 25.9* 23.4*  MCV 87.8 89.0  PLT 194 062   Basic Metabolic Panel:  Recent Labs Lab 03/04/17 1206 03/04/17 1817 03/05/17 0406  NA 138  --  141  K 4.5  --  5.0  CL 103  --  105  CO2 21*  --  23  GLUCOSE 205*  --  201*  BUN 68*  --  67*  CREATININE 3.05*  --  3.03*  CALCIUM 9.9  --  9.8  MG  --  2.3  --    GFR: Estimated Creatinine Clearance: 12.4 mL/min (A) (by C-G formula based on SCr of 3.03 mg/dL (H)). Liver Function Tests:  Recent Labs Lab 03/04/17 1206  AST 12*  ALT 9*  ALKPHOS 67  BILITOT 0.5  PROT 6.4*  ALBUMIN 3.4*    Recent Labs Lab 03/04/17 1206  LIPASE 19   No results for input(s): AMMONIA in the last 168 hours. Coagulation Profile:  Recent Labs Lab 03/04/17 1206  INR 1.00   Cardiac Enzymes:  Recent Labs Lab 03/04/17 1817 03/04/17 2204 03/05/17 0406  TROPONINI <0.03 0.03* 0.05*   BNP (last 3 results) No  results for input(s): PROBNP in the last 8760 hours. HbA1C: No results for input(s): HGBA1C in the last 72 hours. CBG:  Recent Labs Lab 03/05/17 0939  GLUCAP 213*   Lipid Profile:  Recent Labs  03/05/17 0406  CHOL 106  HDL 36*  LDLCALC 40  TRIG 150*  CHOLHDL 2.9   Thyroid Function Tests:  Recent Labs  03/04/17 1817  TSH 0.786   Anemia Panel:  Recent Labs  03/04/17 1817  VITAMINB12 545  FOLATE 30.5  FERRITIN 635*  TIBC 259  IRON 35  RETICCTPCT 1.9   Sepsis Labs: No results for input(s): PROCALCITON, LATICACIDVEN in the last 168 hours.  No results found for this or any previous visit (from the past 240 hour(s)).       Radiology Studies: Ct Abdomen Pelvis Wo Contrast  Result Date: 03/04/2017 CLINICAL DATA:  Chest pain and nausea. EXAM: CT CHEST, ABDOMEN AND  PELVIS WITHOUT CONTRAST TECHNIQUE: Multidetector CT imaging of the chest, abdomen and pelvis was performed following the standard protocol without IV contrast. COMPARISON:  CT abdomen dated 05/14/2013. FINDINGS: CT CHEST FINDINGS Cardiovascular: Aortic atherosclerosis. No aortic aneurysm. Heart size is upper normal. Coronary artery calcifications throughout. Mediastinum/Nodes: Walls of the esophagus appear somewhat thickened throughout. No mass or enlarged lymph nodes appreciated within the mediastinum or perihilar regions. Trachea and central bronchi are unremarkable. Lungs/Pleura: Mild atelectasis/scarring at each lung base. Lungs otherwise clear. No pleural effusion or pneumothorax. Musculoskeletal: Degenerative changes within the thoracic spine, mild to moderate in degree. No acute or suspicious osseous finding. CT ABDOMEN PELVIS FINDINGS Hepatobiliary: Gallbladder is moderately distended but otherwise unremarkable with no evidence of acute cholecystitis. No bile duct dilatation. No focal liver abnormality. Pancreas: Unremarkable. No pancreatic ductal dilatation or surrounding inflammatory changes. Spleen: Normal in size without focal abnormality. Adrenals/Urinary Tract: Adrenal glands are unremarkable. Left renal parapelvic cyst versus extrarenal pelves. No hydronephrosis bilaterally. No ureteral or bladder calculi identified. Stomach/Bowel: Fairly extensive diverticulosis of the descending and sigmoid colon. Scattered additional diverticulosis within the transverse colon. No focal inflammatory change to suggest acute diverticulitis. No dilated large or small bowel loops. Moderate-sized stool ball in the rectal vault. Appendix is not convincingly seen but there are no inflammatory changes about the cecum to suggest acute appendicitis. Vascular/Lymphatic: Aortic atherosclerosis. No enlarged lymph nodes within the abdomen or pelvis. Reproductive: Exophytic uterine fibroid, left fundal region, measuring  approximately 4 cm greatest dimension. Otherwise unremarkable. Other: No free fluid or abscess collection. No free intraperitoneal air. Musculoskeletal: Degenerative changes throughout the lumbar spine, mild to moderate in degree. No acute or suspicious osseous finding. IMPRESSION: 1. Walls of the esophagus appear somewhat thickened throughout suggesting a diffuse esophagitis of infectious or inflammatory nature. 2. Mild atelectasis/scarring at each lung base. No evidence of pneumonia. 3. Diffuse coronary artery calcifications. Recommend correlation with any possible associated cardiac symptoms. 4. Aortic atherosclerosis. 5. Colonic diverticulosis without evidence of acute diverticulitis. No bowel obstruction. 6. Uterine fibroid measuring approximately 4 cm. Electronically Signed   By: Franki Cabot M.D.   On: 03/04/2017 15:31   Dg Chest 2 View  Result Date: 03/04/2017 CLINICAL DATA:  83 year old female with a history of chest pain EXAM: CHEST  2 VIEW COMPARISON:  11/24/2016, 11/17/2016 FINDINGS: Right rotation of the patient, somewhat limits the evaluation. Cardiomediastinal silhouette likely unchanged. Right hilar region is accentuated likely secondary to the rotation of the patient. Calcifications of the aorta. Low lung volumes. No pleural effusion or pneumothorax. Coronary calcifications. No confluent airspace disease. Coarsened interstitial markings,  similar to prior. IMPRESSION: Low lung volumes and chronic lung changes without evidence of acute cardiopulmonary disease. Aortic atherosclerosis and coronary disease. Electronically Signed   By: Corrie Mckusick D.O.   On: 03/04/2017 12:54   Ct Chest Wo Contrast  Result Date: 03/04/2017 CLINICAL DATA:  Chest pain and nausea. EXAM: CT CHEST, ABDOMEN AND PELVIS WITHOUT CONTRAST TECHNIQUE: Multidetector CT imaging of the chest, abdomen and pelvis was performed following the standard protocol without IV contrast. COMPARISON:  CT abdomen dated 05/14/2013. FINDINGS:  CT CHEST FINDINGS Cardiovascular: Aortic atherosclerosis. No aortic aneurysm. Heart size is upper normal. Coronary artery calcifications throughout. Mediastinum/Nodes: Walls of the esophagus appear somewhat thickened throughout. No mass or enlarged lymph nodes appreciated within the mediastinum or perihilar regions. Trachea and central bronchi are unremarkable. Lungs/Pleura: Mild atelectasis/scarring at each lung base. Lungs otherwise clear. No pleural effusion or pneumothorax. Musculoskeletal: Degenerative changes within the thoracic spine, mild to moderate in degree. No acute or suspicious osseous finding. CT ABDOMEN PELVIS FINDINGS Hepatobiliary: Gallbladder is moderately distended but otherwise unremarkable with no evidence of acute cholecystitis. No bile duct dilatation. No focal liver abnormality. Pancreas: Unremarkable. No pancreatic ductal dilatation or surrounding inflammatory changes. Spleen: Normal in size without focal abnormality. Adrenals/Urinary Tract: Adrenal glands are unremarkable. Left renal parapelvic cyst versus extrarenal pelves. No hydronephrosis bilaterally. No ureteral or bladder calculi identified. Stomach/Bowel: Fairly extensive diverticulosis of the descending and sigmoid colon. Scattered additional diverticulosis within the transverse colon. No focal inflammatory change to suggest acute diverticulitis. No dilated large or small bowel loops. Moderate-sized stool ball in the rectal vault. Appendix is not convincingly seen but there are no inflammatory changes about the cecum to suggest acute appendicitis. Vascular/Lymphatic: Aortic atherosclerosis. No enlarged lymph nodes within the abdomen or pelvis. Reproductive: Exophytic uterine fibroid, left fundal region, measuring approximately 4 cm greatest dimension. Otherwise unremarkable. Other: No free fluid or abscess collection. No free intraperitoneal air. Musculoskeletal: Degenerative changes throughout the lumbar spine, mild to moderate in  degree. No acute or suspicious osseous finding. IMPRESSION: 1. Walls of the esophagus appear somewhat thickened throughout suggesting a diffuse esophagitis of infectious or inflammatory nature. 2. Mild atelectasis/scarring at each lung base. No evidence of pneumonia. 3. Diffuse coronary artery calcifications. Recommend correlation with any possible associated cardiac symptoms. 4. Aortic atherosclerosis. 5. Colonic diverticulosis without evidence of acute diverticulitis. No bowel obstruction. 6. Uterine fibroid measuring approximately 4 cm. Electronically Signed   By: Franki Cabot M.D.   On: 03/04/2017 15:31        Scheduled Meds: . amLODipine  10 mg Oral Daily  . aspirin  81 mg Oral Daily  . aspirin EC  81 mg Oral Once  . cloNIDine  0.2 mg Oral TID  . gi cocktail  30 mL Oral Once  . heparin  5,000 Units Subcutaneous Q8H  . isosorbide mononitrate  120 mg Oral Daily  . levothyroxine  25 mcg Intravenous Q24H  . metoprolol  5 mg Intravenous Q6H  .  morphine injection  4 mg Intravenous Once  . pantoprazole (PROTONIX) IV  40 mg Intravenous Q12H  . rosuvastatin  10 mg Oral q1800  . sucralfate  1 g Oral Q6H   Continuous Infusions: . sodium chloride 75 mL/hr at 03/04/17 1851     LOS: 0 days    Time spent: 23 mins    THOMPSON,DANIEL, MD [facility] Pager [phone]  If 7PM-7AM, please contact night-coverage [email or web address] Password [email or web address] 03/05/2017, 11:16 AM

## 2017-03-05 NOTE — Progress Notes (Signed)
Pt just had a bout of chest burning midsternal with no radiation, O2 placed at 2 L/min and pt was given 2 NTG for elevated B/P and pain, Morphine 2 mg given IVP and by the 2 ns NTG she went from 5/10 pain to 2/10 pain to 0/10 pain, EKG done and Cards fellow called, he looked at the EKG, no changes from previous and since she's resting quietly, we'll just monitor her for now.

## 2017-03-05 NOTE — Consult Note (Signed)
Consultation  Referring Provider:  [facility] hospitalist / Dr Grandville Silos Primary Care Physician:  Kenn File, MD Primary Gastroenterologist:  none.  Reason for Consultation:   Chest pain  HPI: [name] is a 83 y.o. female who was admitted yesterday through the emergency room with complaints of abrupt onset of mid sternal chest pain radiating to both of her arms and hands. She denied any associated diaphoresis or shortness of breath. Apparently she did have 1 episode of vomiting. Patient has history of coronary artery disease status post PCI in 2003 and 2008 and was admitted in December 2017 with acute coronary syndrome and had a non-ST MI. Apparently was recommended but she declined. She also has atrial fibrillation for which she is on aspirin, prior history of CVA, congestive heart failure, chronic kidney disease stage IV and insulin-dependent diabetes mellitus. As part of her workup yesterday through the emergency room she had CT of the chest abdomen and pelvis. The walls of the esophagus were noted to be somewhat thickened throughout, there was no evidence of mass or adenopathy. She has diffuse coronary calcifications. Her abdomen had a mildly distended gallbladder and diverticulosis. Patient has not had any prior GI evaluation Patient says she has never had any problems with her GI tract. She denies any recent difficulty with dysphagia or odynophagia. No heartburn or indigestion and says her appetite is been fine. No complaints of abdominal pain and no melena or hematochezia.  She says she did have some chest pain earlier this morning but is not having any pain currently and feels "pretty good". She does have history of chronic anemia as well, reviewing previous labs her hemoglobin was 9.5 in November 2017, when she was hospitalized in December hemoglobin 8.1. Hemoglobin today 7.6, hematocrit of 23.4 MCV of 89. Iron studies B12 and folate have been done and all are within normal  limits. Patient relates that she is being followed by nephrology and just saw them last week and was recommended to get started on "shots" for her anemia through [facility] but says she doesn't want to have to go therefore injections.  Past Medical History:  Diagnosis Date  . CAD (coronary artery disease) 2003   Last catheterization 2008. Two-vessel PCI of the first OM branch and mid LAD.  Marland Kitchen CKD (chronic kidney disease)   . Congestive heart failure ([place])   . CVA (cerebral vascular accident) ([place]) [place]   . Dementia   . DJD (degenerative joint disease) of lumbar spine   . Hypercholesteremia   . Hypertension   . IDDM (insulin dependent diabetes mellitus) ([place])     Past Surgical History:  Procedure Laterality Date  . APPENDECTOMY    . KNEE SURGERY      Prior to Admission medications   Medication Sig Start Date End Date Taking? Authorizing Provider  amLODipine (NORVASC) 10 MG tablet TAKE 1 TABLET DAILY 02/28/17  Yes Timmothy Euler, MD  aspirin EC 81 MG EC tablet Take 1 tablet (81 mg total) by mouth daily. 10/08/16  Yes Bhavinkumar Bhagat, PA  carvedilol (COREG) 6.25 MG tablet Take 1 tablet (6.25 mg total) by mouth 2 (two) times daily. 03/03/17  Yes Timmothy Euler, MD  cholecalciferol (VITAMIN D) 1000 units tablet Take 1,000 Units by mouth daily.   Yes Historical Provider, MD  cloNIDine (CATAPRES) 0.2 MG tablet Take 1 tablet (0.2 mg total) by mouth 3 (three) times daily. 10/27/16  Yes Timmothy Euler, MD  diclofenac sodium (  VOLTAREN) 1 % GEL Apply 4 g topically 4 (four) times daily. 07/29/16  Yes Timmothy Euler, MD  HYDROcodone-acetaminophen (NORCO) 5-325 MG tablet Take 1 tablet by mouth every 6 (six) hours as needed for moderate pain. 12/08/16  Yes Timmothy Euler, MD  insulin NPH Human (HUMULIN N) 100 UNIT/ML injection Inject 0.1 mLs (10 Units total) into the skin 2 (two) times daily before a meal. 02/03/17  Yes Timmothy Euler, MD  isosorbide mononitrate (IMDUR) 120  MG 24 hr tablet TAKE 1 TABLET DAILY 03/02/17  Yes Timmothy Euler, MD  levothyroxine (SYNTHROID, LEVOTHROID) 50 MCG tablet TAKE 1 TABLET DAILY 12/23/16  Yes Timmothy Euler, MD  nitroGLYCERIN (NITROSTAT) 0.4 MG SL tablet PLACE 1 TABLET UNDER THE TONGUE AT ONSET OF CHEST PAIN EVERY 5 MINTUES UP TO 3 TIMES AS NEEDED 02/27/17  Yes Timmothy Euler, MD  polyethylene glycol powder (GLYCOLAX/MIRALAX) powder MIX 1 CAPFUL (17 GRAMS) WITH 8OZ OF WATER OR JUICE DAILY. 12/13/16  Yes Timmothy Euler, MD  rosuvastatin (CRESTOR) 10 MG tablet Take 1 tablet (10 mg total) by mouth daily. 10/17/16  Yes Timmothy Euler, MD  torsemide (DEMADEX) 20 MG tablet Take 1 tablet (20 mg total) by mouth daily. Patient taking differently: Take 20 mg by mouth 3 (three) times daily.  11/08/16  Yes Timmothy Euler, MD  ACCU-CHEK AVIVA PLUS test strip CHECK BLOOD SUGER UP TO 3 TIMES A DAY 02/27/17   Timmothy Euler, MD  Insulin Syringe-Needle U-100 27G X 1/2" 1 ML MISC Use as needed 02/03/17   Timmothy Euler, MD  SENEXON-S 8.6-50 MG tablet TAKE 4 TABLETS BY MOUTH 3 TIMES A DAY UNTIL BOWEL MOVEMENT. Patient not taking: Reported on 03/04/2017 12/13/16   Timmothy Euler, MD    Current Facility-Administered Medications  Medication Dose Route Frequency Provider Last Rate Last Dose  . 0.9 %  sodium chloride infusion   Intravenous Continuous Eugenie Filler, MD 75 mL/hr at 03/04/17 1851    . acetaminophen (TYLENOL) tablet 650 mg  650 mg Oral Q4H PRN Eugenie Filler, MD      . amLODipine (NORVASC) tablet 10 mg  10 mg Oral Daily Eugenie Filler, MD   10 mg at 03/05/17 0859  . aspirin chewable tablet 81 mg  81 mg Oral Daily Eugenie Filler, MD   81 mg at 03/05/17 0859  . aspirin EC tablet 81 mg  81 mg Oral Once Charlesetta Shanks, MD      . calcium carbonate (TUMS - dosed in mg elemental calcium) chewable tablet 200 mg of elemental calcium  1 tablet Oral TID PRN Eugenie Filler, MD      . cloNIDine (CATAPRES) tablet 0.2 mg  0.2  mg Oral TID Eugenie Filler, MD   0.2 mg at 03/05/17 0859  . gi cocktail (Maalox,Lidocaine,Donnatal)  30 mL Oral Once Eugenie Filler, MD      . gi cocktail (Maalox,Lidocaine,Donnatal)  30 mL Oral TID PRN Eugenie Filler, MD   30 mL at 03/05/17 0858  . heparin injection 5,000 Units  5,000 Units Subcutaneous Q8H Eugenie Filler, MD   5,000 Units at 03/05/17 0539  . HYDROcodone-acetaminophen (NORCO/VICODIN) 5-325 MG per tablet 1 tablet  1 tablet Oral Q6H PRN Eugenie Filler, MD      . isosorbide mononitrate (IMDUR) 24 hr tablet 120 mg  120 mg Oral Daily Eugenie Filler, MD   120 mg at 03/05/17 0859  .  levothyroxine (SYNTHROID, LEVOTHROID) injection 25 mcg  25 mcg Intravenous Q24H Eugenie Filler, MD   25 mcg at 03/05/17 0859  . metoprolol (LOPRESSOR) injection 5 mg  5 mg Intravenous Q6H Eugenie Filler, MD   5 mg at 03/05/17 0539  . morphine 2 MG/ML injection 2 mg  2 mg Intravenous Q4H PRN Eugenie Filler, MD   2 mg at 03/04/17 2344  . morphine 4 MG/ML injection 4 mg  4 mg Intravenous Once Charlesetta Shanks, MD      . nitroGLYCERIN (NITROSTAT) SL tablet 0.4 mg  0.4 mg Sublingual Q5 Min x 3 PRN Eugenie Filler, MD      . ondansetron [facility]) injection 4 mg  4 mg Intravenous Q6H PRN Eugenie Filler, MD      . pantoprazole (PROTONIX) injection 40 mg  40 mg Intravenous Q12H Eugenie Filler, MD   40 mg at 03/05/17 0900  . prochlorperazine (COMPAZINE) injection 10 mg  10 mg Intravenous Q4H PRN Eugenie Filler, MD      . rosuvastatin (CRESTOR) tablet 10 mg  10 mg Oral q1800 Eugenie Filler, MD      . sucralfate (CARAFATE) 1 GM/10ML suspension 1 g  1 g Oral Q6H Eugenie Filler, MD   1 g at 03/05/17 0539    Allergies as of 03/04/2017 - Review Complete 03/04/2017  Allergen Reaction Noted  . Propoxyphene n-acetaminophen    . Simvastatin Other (See Comments) 03/30/2011    Family History  Problem Relation Age of Onset  . Heart attack Mother 2  . Heart attack Father 20  .  Diabetes Brother     RETINOPATHY   . Drug abuse Brother   . Liver cancer Brother   . Breast cancer Daughter   . Diabetes Sister       Lab Results:  Recent Labs  03/04/17 1206 03/05/17 0406  WBC 5.6 5.2  HGB 8.6* 7.6*  HCT 25.9* 23.4*  PLT 194 208   BMET  Recent Labs  03/04/17 1206 03/05/17 0406  NA 138 141  K 4.5 5.0  CL 103 105  CO2 21* 23  GLUCOSE 205* 201*  BUN 68* 67*  CREATININE 3.05* 3.03*  CALCIUM 9.9 9.8   LFT  Recent Labs  03/04/17 1206  PROT 6.4*  ALBUMIN 3.4*  AST 12*  ALT 9*  ALKPHOS 67  BILITOT 0.5   PT/INR  Recent Labs  03/04/17 1206  LABPROT 13.2  INR 1.00    Studies/Results: Ct Abdomen Pelvis Wo Contrast  Result Date: 03/04/2017 CLINICAL DATA:  Chest pain and nausea. EXAM: CT CHEST, ABDOMEN AND PELVIS WITHOUT CONTRAST TECHNIQUE: Multidetector CT imaging of the chest, abdomen and pelvis was performed following the standard protocol without IV contrast. COMPARISON:  CT abdomen dated 05/14/2013. FINDINGS: CT CHEST FINDINGS Cardiovascular: Aortic atherosclerosis. No aortic aneurysm. Heart size is upper normal. Coronary artery calcifications throughout. Mediastinum/Nodes: Walls of the esophagus appear somewhat thickened throughout. No mass or enlarged lymph nodes appreciated within the mediastinum or perihilar regions. Trachea and central bronchi are unremarkable. Lungs/Pleura: Mild atelectasis/scarring at each lung base. Lungs otherwise clear. No pleural effusion or pneumothorax. Musculoskeletal: Degenerative changes within the thoracic spine, mild to moderate in degree. No acute or suspicious osseous finding. CT ABDOMEN PELVIS FINDINGS Hepatobiliary: Gallbladder is moderately distended but otherwise unremarkable with no evidence of acute cholecystitis. No bile duct dilatation. No focal liver abnormality. Pancreas: Unremarkable. No pancreatic ductal dilatation or surrounding inflammatory changes. Spleen: Normal in size without  focal abnormality.  Adrenals/Urinary Tract: Adrenal glands are unremarkable. Left renal parapelvic cyst versus extrarenal pelves. No hydronephrosis bilaterally. No ureteral or bladder calculi identified. Stomach/Bowel: Fairly extensive diverticulosis of the descending and sigmoid colon. Scattered additional diverticulosis within the transverse colon. No focal inflammatory change to suggest acute diverticulitis. No dilated large or small bowel loops. Moderate-sized stool ball in the rectal vault. Appendix is not convincingly seen but there are no inflammatory changes about the cecum to suggest acute appendicitis. Vascular/Lymphatic: Aortic atherosclerosis. No enlarged lymph nodes within the abdomen or pelvis. Reproductive: Exophytic uterine fibroid, left fundal region, measuring approximately 4 cm greatest dimension. Otherwise unremarkable. Other: No free fluid or abscess collection. No free intraperitoneal air. Musculoskeletal: Degenerative changes throughout the lumbar spine, mild to moderate in degree. No acute or suspicious osseous finding. IMPRESSION: 1. Walls of the esophagus appear somewhat thickened throughout suggesting a diffuse esophagitis of infectious or inflammatory nature. 2. Mild atelectasis/scarring at each lung base. No evidence of pneumonia. 3. Diffuse coronary artery calcifications. Recommend correlation with any possible associated cardiac symptoms. 4. Aortic atherosclerosis. 5. Colonic diverticulosis without evidence of acute diverticulitis. No bowel obstruction. 6. Uterine fibroid measuring approximately 4 cm. Electronically Signed   By: Franki Cabot M.D.   On: 03/04/2017 15:31   Dg Chest 2 View  Result Date: 03/04/2017 CLINICAL DATA:  83 year old female with a history of chest pain EXAM: CHEST  2 VIEW COMPARISON:  11/24/2016, 11/17/2016 FINDINGS: Right rotation of the patient, somewhat limits the evaluation. Cardiomediastinal silhouette likely unchanged. Right hilar region is accentuated likely secondary to  the rotation of the patient. Calcifications of the aorta. Low lung volumes. No pleural effusion or pneumothorax. Coronary calcifications. No confluent airspace disease. Coarsened interstitial markings, similar to prior. IMPRESSION: Low lung volumes and chronic lung changes without evidence of acute cardiopulmonary disease. Aortic atherosclerosis and coronary disease. Electronically Signed   By: Corrie Mckusick D.O.   On: 03/04/2017 12:54   Ct Chest Wo Contrast  Result Date: 03/04/2017 CLINICAL DATA:  Chest pain and nausea. EXAM: CT CHEST, ABDOMEN AND PELVIS WITHOUT CONTRAST TECHNIQUE: Multidetector CT imaging of the chest, abdomen and pelvis was performed following the standard protocol without IV contrast. COMPARISON:  CT abdomen dated 05/14/2013. FINDINGS: CT CHEST FINDINGS Cardiovascular: Aortic atherosclerosis. No aortic aneurysm. Heart size is upper normal. Coronary artery calcifications throughout. Mediastinum/Nodes: Walls of the esophagus appear somewhat thickened throughout. No mass or enlarged lymph nodes appreciated within the mediastinum or perihilar regions. Trachea and central bronchi are unremarkable. Lungs/Pleura: Mild atelectasis/scarring at each lung base. Lungs otherwise clear. No pleural effusion or pneumothorax. Musculoskeletal: Degenerative changes within the thoracic spine, mild to moderate in degree. No acute or suspicious osseous finding. CT ABDOMEN PELVIS FINDINGS Hepatobiliary: Gallbladder is moderately distended but otherwise unremarkable with no evidence of acute cholecystitis. No bile duct dilatation. No focal liver abnormality. Pancreas: Unremarkable. No pancreatic ductal dilatation or surrounding inflammatory changes. Spleen: Normal in size without focal abnormality. Adrenals/Urinary Tract: Adrenal glands are unremarkable. Left renal parapelvic cyst versus extrarenal pelves. No hydronephrosis bilaterally. No ureteral or bladder calculi identified. Stomach/Bowel: Fairly extensive  diverticulosis of the descending and sigmoid colon. Scattered additional diverticulosis within the transverse colon. No focal inflammatory change to suggest acute diverticulitis. No dilated large or small bowel loops. Moderate-sized stool ball in the rectal vault. Appendix is not convincingly seen but there are no inflammatory changes about the cecum to suggest acute appendicitis. Vascular/Lymphatic: Aortic atherosclerosis. No enlarged lymph nodes within the abdomen or pelvis. Reproductive: Exophytic uterine fibroid,  left fundal region, measuring approximately 4 cm greatest dimension. Otherwise unremarkable. Other: No free fluid or abscess collection. No free intraperitoneal air. Musculoskeletal: Degenerative changes throughout the lumbar spine, mild to moderate in degree. No acute or suspicious osseous finding. IMPRESSION: 1. Walls of the esophagus appear somewhat thickened throughout suggesting a diffuse esophagitis of infectious or inflammatory nature. 2. Mild atelectasis/scarring at each lung base. No evidence of pneumonia. 3. Diffuse coronary artery calcifications. Recommend correlation with any possible associated cardiac symptoms. 4. Aortic atherosclerosis. 5. Colonic diverticulosis without evidence of acute diverticulitis. No bowel obstruction. 6. Uterine fibroid measuring approximately 4 cm. Electronically Signed   By: Franki Cabot M.D.   On: 03/04/2017 15:31          .  Physical Exam: Vital signs in last 24 hours: Temp:  [97.7 F (36.5 C)-99.7 F (37.6 C)] 97.9 F (36.6 C) (04/08 0447) Pulse Rate:  [66-117] 74 (04/08 0737) Resp:  [10-22] 18 (04/07 1748) BP: (125-199)/(44-111) 174/111 (04/08 0858) SpO2:  [92 %-100 %] 99 % (04/08 0737) Weight:  [137 lb 12.6 oz (62.5 kg)] 137 lb 12.6 oz (62.5 kg) (04/08 0447) Last BM Date: 03/05/17 General:   Alert,  Well-developed, well-nourished,elderly WF pleasant and cooperative in NAD Head:  Normocephalic and atraumatic. Eyes:  Sclera clear, no  icterus.   Conjunctiva pale Ears:  Normal auditory acuity. Nose:  No deformity, discharge,  or lesions. Mouth:  No deformity or lesions.   Neck:  Supple; no masses or thyromegaly. Lungs:  Clear throughout to auscultation.   No wheezes, crackles, or rhonchi. Heart:  Regular rate and rhythm; Holosystolic murmur Abdomen:  Soft,nontender, BS active,nonpalp mass or hsm.   Rectal:  Deferred  Msk:  Symmetrical without gross deformities. . Pulses:  Normal pulses noted. Extremities:  Without clubbing or edema. Neurologic:  Alert and  oriented x4;  grossly normal neurologically. Skin:  Intact without significant lesions or rashes.. Psych:  Alert and cooperative. Normal mood and affect.  Intake/Output from previous day: 04/07 0701 - 04/08 0700 In: 995.4 [P.O.:60; I.V.:935.4] Out: 650 [Urine:650] Intake/Output this shift: No intake/output data recorded.  Lab Results:  Recent Labs  03/04/17 1206 03/05/17 0406  WBC 5.6 5.2  HGB 8.6* 7.6*  HCT 25.9* 23.4*  PLT 194 208   BMET  Recent Labs  03/04/17 1206 03/05/17 0406  NA 138 141  K 4.5 5.0  CL 103 105  CO2 21* 23  GLUCOSE 205* 201*  BUN 68* 67*  CREATININE 3.05* 3.03*  CALCIUM 9.9 9.8   LFT  Recent Labs  03/04/17 1206  PROT 6.4*  ALBUMIN 3.4*  AST 12*  ALT 9*  ALKPHOS 67  BILITOT 0.5   PT/INR  Recent Labs  03/04/17 1206  LABPROT 13.2  INR 1.00     IMPRESSION:  #63 83 year old white female admitted yesterday with substernal chest pain radiating to both arms. She has had elevation of troponin 2. This is in the setting of known coronary artery disease with 2 remote PCI's and a non-ST MI in December 2017. Chest pain is most consistent with acute coronary syndrome  #2 incidental finding of esophageal wall thickening rather diffusely on CT of the chest. Patient is asymptomatic. She may have some element of esophagitis, but as she is asymptomatic doubt if this is causing her chest pain #3 chronic kidney disease  stage IV-creatinine 3 #4 anemia of chronic disease-secondary to chronic kidney disease #5 history of atrial fibrillation #6 aortic stenosis #7 history of CVA #8 insulin-dependent diabetes  mellitus #9 hypertension  Plan; Patient needs cardiology consultation Will not plan any endoscopic evaluation at this time. Consider barium swallow-though suspect this will be low yield as she is asymptomatic Okay to continue daily PPI, and Carafate slurry  TID Patient needs to get started on EPO   Amy [place]  03/05/2017, 10:27 AM   Attending physician's note   I have taken a history, examined the patient and reviewed the chart. I agree with the Advanced Practitioner's note, impression and recommendations. 83 year old female admitted with substernal chest pain, incidental finding of diffuse esophageal wall thickening on CT chest. Patient denies any dysphagia or odynophagia. No specific GI symptoms. Retrosternal chest pain has also improved since admission. May consider barium swallow to evaluate for any possible mucosal abnormalities. Given potential risks related to sedation and procedure and also likely low yield, will hold off EGD. Continue empiric PPI daily and Carafate before meals as needed. Please call with any questions  K Denzil Magnuson, MD [phone] Mon-Fri 8a-5p [phone] after 5p, weekends, holidays

## 2017-03-05 NOTE — Progress Notes (Signed)
Updated report received via [place] RN in patient's room using SBAR format, reviewed new orders and events of the day, assumed care of patient.

## 2017-03-05 NOTE — Progress Notes (Signed)
Report received in patient's room via [place] RN using SBAR format, reviewed diagnosis, POC and some PMH, assumed care of patient.

## 2017-03-06 ENCOUNTER — Observation Stay (HOSPITAL_COMMUNITY): Payer: Medicare Other

## 2017-03-06 ENCOUNTER — Encounter (HOSPITAL_COMMUNITY): Payer: Self-pay | Admitting: *Deleted

## 2017-03-06 DIAGNOSIS — N179 Acute kidney failure, unspecified: Secondary | ICD-10-CM

## 2017-03-06 DIAGNOSIS — Z66 Do not resuscitate: Secondary | ICD-10-CM | POA: Diagnosis not present

## 2017-03-06 DIAGNOSIS — Z794 Long term (current) use of insulin: Secondary | ICD-10-CM | POA: Diagnosis not present

## 2017-03-06 DIAGNOSIS — K5730 Diverticulosis of large intestine without perforation or abscess without bleeding: Secondary | ICD-10-CM | POA: Diagnosis present

## 2017-03-06 DIAGNOSIS — R079 Chest pain, unspecified: Secondary | ICD-10-CM | POA: Diagnosis not present

## 2017-03-06 DIAGNOSIS — K209 Esophagitis, unspecified: Secondary | ICD-10-CM | POA: Diagnosis not present

## 2017-03-06 DIAGNOSIS — K210 Gastro-esophageal reflux disease with esophagitis: Secondary | ICD-10-CM | POA: Diagnosis not present

## 2017-03-06 DIAGNOSIS — E1122 Type 2 diabetes mellitus with diabetic chronic kidney disease: Secondary | ICD-10-CM | POA: Diagnosis present

## 2017-03-06 DIAGNOSIS — I472 Ventricular tachycardia: Secondary | ICD-10-CM | POA: Diagnosis not present

## 2017-03-06 DIAGNOSIS — I350 Nonrheumatic aortic (valve) stenosis: Secondary | ICD-10-CM | POA: Diagnosis present

## 2017-03-06 DIAGNOSIS — I447 Left bundle-branch block, unspecified: Secondary | ICD-10-CM | POA: Diagnosis not present

## 2017-03-06 DIAGNOSIS — I200 Unstable angina: Secondary | ICD-10-CM

## 2017-03-06 DIAGNOSIS — M19019 Primary osteoarthritis, unspecified shoulder: Secondary | ICD-10-CM | POA: Diagnosis present

## 2017-03-06 DIAGNOSIS — I2510 Atherosclerotic heart disease of native coronary artery without angina pectoris: Secondary | ICD-10-CM | POA: Diagnosis not present

## 2017-03-06 DIAGNOSIS — I130 Hypertensive heart and chronic kidney disease with heart failure and stage 1 through stage 4 chronic kidney disease, or unspecified chronic kidney disease: Secondary | ICD-10-CM | POA: Diagnosis not present

## 2017-03-06 DIAGNOSIS — F0390 Unspecified dementia without behavioral disturbance: Secondary | ICD-10-CM | POA: Diagnosis present

## 2017-03-06 DIAGNOSIS — N189 Chronic kidney disease, unspecified: Secondary | ICD-10-CM

## 2017-03-06 DIAGNOSIS — E118 Type 2 diabetes mellitus with unspecified complications: Secondary | ICD-10-CM | POA: Diagnosis not present

## 2017-03-06 DIAGNOSIS — I5032 Chronic diastolic (congestive) heart failure: Secondary | ICD-10-CM | POA: Diagnosis not present

## 2017-03-06 DIAGNOSIS — E7800 Pure hypercholesterolemia, unspecified: Secondary | ICD-10-CM | POA: Diagnosis not present

## 2017-03-06 DIAGNOSIS — R918 Other nonspecific abnormal finding of lung field: Secondary | ICD-10-CM | POA: Diagnosis not present

## 2017-03-06 DIAGNOSIS — Z8673 Personal history of transient ischemic attack (TIA), and cerebral infarction without residual deficits: Secondary | ICD-10-CM | POA: Diagnosis not present

## 2017-03-06 DIAGNOSIS — K828 Other specified diseases of gallbladder: Secondary | ICD-10-CM | POA: Diagnosis not present

## 2017-03-06 DIAGNOSIS — N184 Chronic kidney disease, stage 4 (severe): Secondary | ICD-10-CM | POA: Diagnosis not present

## 2017-03-06 DIAGNOSIS — I480 Paroxysmal atrial fibrillation: Secondary | ICD-10-CM | POA: Diagnosis present

## 2017-03-06 DIAGNOSIS — E785 Hyperlipidemia, unspecified: Secondary | ICD-10-CM | POA: Diagnosis present

## 2017-03-06 DIAGNOSIS — I10 Essential (primary) hypertension: Secondary | ICD-10-CM | POA: Diagnosis not present

## 2017-03-06 DIAGNOSIS — I249 Acute ischemic heart disease, unspecified: Secondary | ICD-10-CM | POA: Diagnosis not present

## 2017-03-06 DIAGNOSIS — R072 Precordial pain: Secondary | ICD-10-CM | POA: Diagnosis not present

## 2017-03-06 DIAGNOSIS — Z9861 Coronary angioplasty status: Secondary | ICD-10-CM | POA: Diagnosis not present

## 2017-03-06 DIAGNOSIS — D631 Anemia in chronic kidney disease: Secondary | ICD-10-CM | POA: Diagnosis not present

## 2017-03-06 DIAGNOSIS — I440 Atrioventricular block, first degree: Secondary | ICD-10-CM | POA: Diagnosis not present

## 2017-03-06 DIAGNOSIS — H8110 Benign paroxysmal vertigo, unspecified ear: Secondary | ICD-10-CM | POA: Diagnosis not present

## 2017-03-06 LAB — CBC
HEMATOCRIT: 24.3 % — AB (ref 36.0–46.0)
Hemoglobin: 8.1 g/dL — ABNORMAL LOW (ref 12.0–15.0)
MCH: 29.6 pg (ref 26.0–34.0)
MCHC: 33.3 g/dL (ref 30.0–36.0)
MCV: 88.7 fL (ref 78.0–100.0)
Platelets: 174 10*3/uL (ref 150–400)
RBC: 2.74 MIL/uL — ABNORMAL LOW (ref 3.87–5.11)
RDW: 13.1 % (ref 11.5–15.5)
WBC: 7.8 10*3/uL (ref 4.0–10.5)

## 2017-03-06 LAB — BASIC METABOLIC PANEL
Anion gap: 15 (ref 5–15)
BUN: 66 mg/dL — ABNORMAL HIGH (ref 6–20)
CO2: 18 mmol/L — ABNORMAL LOW (ref 22–32)
Calcium: 9.5 mg/dL (ref 8.9–10.3)
Chloride: 104 mmol/L (ref 101–111)
Creatinine, Ser: 3.05 mg/dL — ABNORMAL HIGH (ref 0.44–1.00)
GFR calc Af Amer: 15 mL/min — ABNORMAL LOW (ref >60)
GFR, EST NON AFRICAN AMERICAN: 13 mL/min — AB (ref >60)
GLUCOSE: 260 mg/dL — AB (ref 65–99)
POTASSIUM: 5.2 mmol/L — AB (ref 3.5–5.1)
SODIUM: 137 mmol/L (ref 135–145)

## 2017-03-06 LAB — URINE CULTURE

## 2017-03-06 LAB — GLUCOSE, CAPILLARY
GLUCOSE-CAPILLARY: 184 mg/dL — AB (ref 65–99)
GLUCOSE-CAPILLARY: 165 mg/dL — AB (ref 65–99)
Glucose-Capillary: 251 mg/dL — ABNORMAL HIGH (ref 65–99)

## 2017-03-06 MED ORDER — NITROGLYCERIN IN D5W 200-5 MCG/ML-% IV SOLN
0.0000 ug/min | INTRAVENOUS | Status: DC
  Administered 2017-03-06: 5 ug/min via INTRAVENOUS
  Filled 2017-03-06: qty 250

## 2017-03-06 MED ORDER — HYDRALAZINE HCL 10 MG PO TABS
10.0000 mg | ORAL_TABLET | Freq: Three times a day (TID) | ORAL | Status: DC
  Administered 2017-03-06 – 2017-03-09 (×9): 10 mg via ORAL
  Filled 2017-03-06 (×9): qty 1

## 2017-03-06 MED ORDER — LEVOTHYROXINE SODIUM 50 MCG PO TABS
50.0000 ug | ORAL_TABLET | Freq: Every day | ORAL | Status: DC
  Administered 2017-03-07 – 2017-03-09 (×3): 50 ug via ORAL
  Filled 2017-03-06 (×3): qty 1

## 2017-03-06 MED ORDER — INSULIN ASPART 100 UNIT/ML ~~LOC~~ SOLN
0.0000 [IU] | Freq: Three times a day (TID) | SUBCUTANEOUS | Status: DC
  Administered 2017-03-06: 5 [IU] via SUBCUTANEOUS
  Administered 2017-03-06: 2 [IU] via SUBCUTANEOUS
  Administered 2017-03-07 (×2): 3 [IU] via SUBCUTANEOUS
  Administered 2017-03-08 – 2017-03-09 (×4): 2 [IU] via SUBCUTANEOUS

## 2017-03-06 MED ORDER — PANTOPRAZOLE SODIUM 40 MG PO TBEC
40.0000 mg | DELAYED_RELEASE_TABLET | Freq: Every day | ORAL | Status: DC
Start: 2017-03-06 — End: 2017-03-09
  Administered 2017-03-06 – 2017-03-09 (×4): 40 mg via ORAL
  Filled 2017-03-06 (×4): qty 1

## 2017-03-06 NOTE — Progress Notes (Signed)
Daily Rounding Note  03/06/2017, 9:33 AM  LOS: 0 days   SUBJECTIVE:   Chief complaint: More chest burning overnight. No associated EKG changes.        OBJECTIVE:         Vital signs in last 24 hours:    Temp:  [97.8 F (36.6 C)-98.6 F (37 C)] 97.8 F (36.6 C) (04/09 0847) Pulse Rate:  [59-97] 89 (04/09 0847) Resp:  [18] 18 (04/09 0847) BP: (107-181)/(42-86) 165/70 (04/09 0847) SpO2:  [93 %-100 %] 97 % (04/09 0847) Last BM Date: 03/05/17 Filed Weights   03/04/17 1748 03/05/17 0447  Weight: 62.5 kg (137 lb 12.6 oz) 62.5 kg (137 lb 12.6 oz)   General: chronically unwell.   Heart: RRR Chest: few rales in bases.  Reduced BS.  No dyspnea Abdomen: soft, NT, ND  Extremities: slight non-pitting edema in right ankle, that limb and right UE with hemiweakness/paresis.  Neuro/Psych:  Oriented x 3.  Appropriate. Weakness in right UE and LE  Intake/Output from previous day: 04/08 0701 - 04/09 0700 In: 240 [P.O.:240] Out: 300 [Urine:300]  Intake/Output this shift: Total I/O In: 120 [P.O.:120] Out: -   Lab Results:  Recent Labs  03/04/17 1206 03/05/17 0406 03/06/17 0649  WBC 5.6 5.2 7.8  HGB 8.6* 7.6* 8.1*  HCT 25.9* 23.4* 24.3*  PLT 194 208 174   BMET  Recent Labs  03/04/17 1206 03/05/17 0406 03/06/17 0407  NA 138 141 137  K 4.5 5.0 5.2*  CL 103 105 104  CO2 21* 23 18*  GLUCOSE 205* 201* 260*  BUN 68* 67* 66*  CREATININE 3.05* 3.03* 3.05*  CALCIUM 9.9 9.8 9.5   LFT  Recent Labs  03/04/17 1206  PROT 6.4*  ALBUMIN 3.4*  AST 12*  ALT 9*  ALKPHOS 67  BILITOT 0.5   PT/INR  Recent Labs  03/04/17 1206  LABPROT 13.2  INR 1.00   Hepatitis Panel No results for input(s): HEPBSAG, HCVAB, HEPAIGM, HEPBIGM in the last 72 hours.  Studies/Results: Ct Abdomen Pelvis Wo Contrast  Result Date: 03/04/2017 CLINICAL DATA:  Chest pain and nausea. EXAM: CT CHEST, ABDOMEN AND PELVIS WITHOUT CONTRAST  TECHNIQUE: Multidetector CT imaging of the chest, abdomen and pelvis was performed following the standard protocol without IV contrast. COMPARISON:  CT abdomen dated 05/14/2013. FINDINGS: CT CHEST FINDINGS Cardiovascular: Aortic atherosclerosis. No aortic aneurysm. Heart size is upper normal. Coronary artery calcifications throughout. Mediastinum/Nodes: Walls of the esophagus appear somewhat thickened throughout. No mass or enlarged lymph nodes appreciated within the mediastinum or perihilar regions. Trachea and central bronchi are unremarkable. Lungs/Pleura: Mild atelectasis/scarring at each lung base. Lungs otherwise clear. No pleural effusion or pneumothorax. Musculoskeletal: Degenerative changes within the thoracic spine, mild to moderate in degree. No acute or suspicious osseous finding. CT ABDOMEN PELVIS FINDINGS Hepatobiliary: Gallbladder is moderately distended but otherwise unremarkable with no evidence of acute cholecystitis. No bile duct dilatation. No focal liver abnormality. Pancreas: Unremarkable. No pancreatic ductal dilatation or surrounding inflammatory changes. Spleen: Normal in size without focal abnormality. Adrenals/Urinary Tract: Adrenal glands are unremarkable. Left renal parapelvic cyst versus extrarenal pelves. No hydronephrosis bilaterally. No ureteral or bladder calculi identified. Stomach/Bowel: Fairly extensive diverticulosis of the descending and sigmoid colon. Scattered additional diverticulosis within the transverse colon. No focal inflammatory change to suggest acute diverticulitis. No dilated large or small bowel loops. Moderate-sized stool ball in the rectal vault. Appendix is not convincingly seen but there are no inflammatory changes  about the cecum to suggest acute appendicitis. Vascular/Lymphatic: Aortic atherosclerosis. No enlarged lymph nodes within the abdomen or pelvis. Reproductive: Exophytic uterine fibroid, left fundal region, measuring approximately 4 cm greatest  dimension. Otherwise unremarkable. Other: No free fluid or abscess collection. No free intraperitoneal air. Musculoskeletal: Degenerative changes throughout the lumbar spine, mild to moderate in degree. No acute or suspicious osseous finding. IMPRESSION: 1. Walls of the esophagus appear somewhat thickened throughout suggesting a diffuse esophagitis of infectious or inflammatory nature. 2. Mild atelectasis/scarring at each lung base. No evidence of pneumonia. 3. Diffuse coronary artery calcifications. Recommend correlation with any possible associated cardiac symptoms. 4. Aortic atherosclerosis. 5. Colonic diverticulosis without evidence of acute diverticulitis. No bowel obstruction. 6. Uterine fibroid measuring approximately 4 cm. Electronically Signed   By: Franki Cabot M.D.   On: 03/04/2017 15:31   Dg Chest 2 View  Result Date: 03/04/2017 CLINICAL DATA:  83 year old female with a history of chest pain EXAM: CHEST  2 VIEW COMPARISON:  11/24/2016, 11/17/2016 FINDINGS: Right rotation of the patient, somewhat limits the evaluation. Cardiomediastinal silhouette likely unchanged. Right hilar region is accentuated likely secondary to the rotation of the patient. Calcifications of the aorta. Low lung volumes. No pleural effusion or pneumothorax. Coronary calcifications. No confluent airspace disease. Coarsened interstitial markings, similar to prior. IMPRESSION: Low lung volumes and chronic lung changes without evidence of acute cardiopulmonary disease. Aortic atherosclerosis and coronary disease. Electronically Signed   By: Corrie Mckusick D.O.   On: 03/04/2017 12:54   Ct Chest Wo Contrast  Result Date: 03/04/2017 CLINICAL DATA:  Chest pain and nausea. EXAM: CT CHEST, ABDOMEN AND PELVIS WITHOUT CONTRAST TECHNIQUE: Multidetector CT imaging of the chest, abdomen and pelvis was performed following the standard protocol without IV contrast. COMPARISON:  CT abdomen dated 05/14/2013. FINDINGS: CT CHEST FINDINGS  Cardiovascular: Aortic atherosclerosis. No aortic aneurysm. Heart size is upper normal. Coronary artery calcifications throughout. Mediastinum/Nodes: Walls of the esophagus appear somewhat thickened throughout. No mass or enlarged lymph nodes appreciated within the mediastinum or perihilar regions. Trachea and central bronchi are unremarkable. Lungs/Pleura: Mild atelectasis/scarring at each lung base. Lungs otherwise clear. No pleural effusion or pneumothorax. Musculoskeletal: Degenerative changes within the thoracic spine, mild to moderate in degree. No acute or suspicious osseous finding. CT ABDOMEN PELVIS FINDINGS Hepatobiliary: Gallbladder is moderately distended but otherwise unremarkable with no evidence of acute cholecystitis. No bile duct dilatation. No focal liver abnormality. Pancreas: Unremarkable. No pancreatic ductal dilatation or surrounding inflammatory changes. Spleen: Normal in size without focal abnormality. Adrenals/Urinary Tract: Adrenal glands are unremarkable. Left renal parapelvic cyst versus extrarenal pelves. No hydronephrosis bilaterally. No ureteral or bladder calculi identified. Stomach/Bowel: Fairly extensive diverticulosis of the descending and sigmoid colon. Scattered additional diverticulosis within the transverse colon. No focal inflammatory change to suggest acute diverticulitis. No dilated large or small bowel loops. Moderate-sized stool ball in the rectal vault. Appendix is not convincingly seen but there are no inflammatory changes about the cecum to suggest acute appendicitis. Vascular/Lymphatic: Aortic atherosclerosis. No enlarged lymph nodes within the abdomen or pelvis. Reproductive: Exophytic uterine fibroid, left fundal region, measuring approximately 4 cm greatest dimension. Otherwise unremarkable. Other: No free fluid or abscess collection. No free intraperitoneal air. Musculoskeletal: Degenerative changes throughout the lumbar spine, mild to moderate in degree. No acute  or suspicious osseous finding. IMPRESSION: 1. Walls of the esophagus appear somewhat thickened throughout suggesting a diffuse esophagitis of infectious or inflammatory nature. 2. Mild atelectasis/scarring at each lung base. No evidence of pneumonia. 3. Diffuse coronary artery  calcifications. Recommend correlation with any possible associated cardiac symptoms. 4. Aortic atherosclerosis. 5. Colonic diverticulosis without evidence of acute diverticulitis. No bowel obstruction. 6. Uterine fibroid measuring approximately 4 cm. Electronically Signed   By: Franki Cabot M.D.   On: 03/04/2017 15:31   Dg Esophagus  Result Date: 03/06/2017 CLINICAL DATA:  Chest pain. Abnormal esophageal thickening on CT chest EXAM: ESOPHOGRAM/BARIUM SWALLOW TECHNIQUE: Single contrast examination was performed using thin barium and barium tablet. FLUOROSCOPY TIME:  Fluoroscopy Time:  1 minutes 42 second Radiation Exposure Index (if provided by the fluoroscopic device): Number of Acquired Spot Images: 0 COMPARISON:  CT chest 03/04/2017 FINDINGS: Poor esophageal motility. There is esophageal spasm with uncoordinated peristalsis. Barium passes into the stomach without obstruction. No stricture or mass. Negative for hiatal hernia. No significant mucosal irregularity or ulceration. The patient swallowed a barium tablet which did not pass into the stomach. This is felt to be due to poor peristalsis and supine positioning. IMPRESSION: Poor esophageal motility with esophageal spasm. No evidence of stricture or mass. No evidence of mucosal irregularity. Barium tablet progressed to the distal esophagus but not into the stomach, felt to be due to poor peristalsis. No stricture. Electronically Signed   By: Franchot Gallo M.D.   On: 03/06/2017 08:40   Scheduled Meds: . amLODipine  10 mg Oral Daily  . aspirin  81 mg Oral Daily  . aspirin EC  81 mg Oral Once  . carvedilol  3.125 mg Oral BID WC  . cloNIDine  0.1 mg Oral BID  . gi cocktail  30 mL  Oral Once  . heparin  5,000 Units Subcutaneous Q8H  . isosorbide mononitrate  120 mg Oral Daily  . levothyroxine  25 mcg Intravenous Q24H  .  morphine injection  4 mg Intravenous Once  . pantoprazole (PROTONIX) IV  40 mg Intravenous Q12H  . rosuvastatin  10 mg Oral q1800  . sucralfate  1 g Oral Q6H   Continuous Infusions: PRN Meds:.acetaminophen, calcium carbonate, gi cocktail, hydrALAZINE, HYDROcodone-acetaminophen, morphine injection, nitroGLYCERIN, ondansetron (ZOFRAN) IV, prochlorperazine  ASSESMENT:   *  Burning chest pain, esophageal wall thickening per CT.  No dysphagia, pyrosis etc.  Doubt this is cause of her chest pain.No PPI at home.  On IV Protonix and Carafate, both new.   Esophagram shows dysmotility, spasm and failure of tablet to pass due to the dysmotility.  No stricture or mucosal irregularities.    *  Chest pain.  CAD with PCIs in past, non-STEMI 10/2016 and refused cath then as well as now.    Acute on chronic diastolic HF.  Multiple rhythm pauses. Minor jump of troponins to 0.05.    *  Normocytic anemia.  Chronic.  Suspect anemia of chronic disease.  Stool FOBT + in 10/2016.   CT shows colonic diverticulosis, stool in rectal vault, no GI malignancy.  Not sure if getting EPO, but I find no encounters for infusion therapy and renal sends their non ESRD pt's to day hospital for outpt infusions.    *  CKD 4.  Near ESRD.    *  A Fib.  Hx CVA.    *  IDDM   PLAN   *   Switch to po Protonix daily.  No plans at present for EGD.  *  Consider renal consult and ? initiation of Epo.  Pt says she has never seen a kidney MD.       Azucena Freed  03/06/2017, 9:33 AM Pager: [phone]  ________________________________________________________________________  [facility] MD note:  I personally examined the patient, reviewed the data and agree with the assessment and plan described above.  She has presumed reflux esophagitis.  PPI started yesterday and she should stay on  this daily, best take 20-30 min prior to BF meal.  Currently no plans for invasive GI testing.  Cardiac workup ongoing.  Please call or page with any further questions or concerns.  She should follow up as needed with [facility], Dr. Silverio Decamp.    Owens Loffler, MD [facility] Pager [phone]

## 2017-03-06 NOTE — Progress Notes (Signed)
Occupational Therapy Evaluation Patient Details Name: [name] MRN: [id number] DOB: 1932-01-03 Today's Date: 03/06/2017    History of Present Illness 83 y.o. female admitted with chest pain and HTN with multiple 5-6 second pauses. Pt had NSTEMI 4 months ago. PMH significant for CKD, CVA (left side), dementia and DM.    Clinical Impression   PTA, per daughter/pt, pt stayed by herself during the day and was modified independent with mobility and required set up with ADL. Daughter states she does not feel that her mother is at baseline. Pt is fall risk at this time. Recommend follow up with [facility]. Recommend pt have S with all ADL and mobility after DC.     Follow Up Recommendations  Home health OT;Supervision/Assistance - 24 hour (initially)    Equipment Recommendations  None recommended by OT    Recommendations for Other Services       Precautions / Restrictions Precautions Precautions: Fall Restrictions Weight Bearing Restrictions: No      Mobility Bed Mobility Overal bed mobility: Needs Assistance Bed Mobility: Sit to Supine     Supine to sit: Supervision        Transfers Overall transfer level: Needs assistance Equipment used: Rolling walker (2 wheeled) Transfers: Sit to/from Stand Sit to Stand: Min assist         General transfer comment: v/c's and tactile cues to push up from bed    Balance Overall balance assessment: Needs assistance;History of Falls Sitting-balance support: No upper extremity supported;Feet supported Sitting balance-Leahy Scale: Fair Sitting balance - Comments: attempted to don socks however unable   Standing balance support: Bilateral upper extremity supported Standing balance-Leahy Scale: Poor Standing balance comment: requires rolling walker (fall prior to admission)                           ADL either performed or assessed with clinical judgement   ADL Overall ADL's : Needs assistance/impaired Eating/Feeding:  Independent   Grooming: Set up;Supervision/safety;Sitting   Upper Body Bathing: Supervision/ safety;Set up;Sitting   Lower Body Bathing: Min guard;Sit to/from stand   Upper Body Dressing : Set up;Sitting   Lower Body Dressing: Min guard;Sit to/from stand   Toilet Transfer: Designer, fashion/clothing and Hygiene: Min guard       Functional mobility during ADLs: Minimal assistance General ADL Comments: Physical assist required to power up from recliner     Vision Baseline Vision/History: No visual deficits       Perception     Praxis      Pertinent Vitals/Pain  c/o pain 3/10 L hip Limited activity as pt tolerated     Hand Dominance Right   Extremity/Trunk Assessment Upper Extremity Assessment Upper Extremity Assessment: Generalized weakness   Lower Extremity Assessment Lower Extremity Assessment: Defer to PT evaluation   Cervical / Trunk Assessment Cervical / Trunk Assessment: Kyphotic   Communication Communication Communication: No difficulties   Cognition Arousal/Alertness: Awake/alert Behavior During Therapy: WFL for tasks assessed/performed Overall Cognitive Status: History of cognitive impairments - at baseline                                 General Comments: pt with h/o dementia   General Comments       Exercises     Shoulder Instructions      Home Living Family/patient expects to be discharged to:: Private residence Living Arrangements:  Children Available Help at Discharge: Family;Available PRN/intermittently (works 2-4 hours) Type of Home: House Home Access: Level entry     Home Layout: One level     Bathroom Shower/Tub: Occupational psychologist: Standard Bathroom Accessibility: No   Home Equipment: Environmental consultant - 2 wheels;Cane - single point;Bedside commode   Additional Comments: lives with daughter who is unable to provide 24/7 assist      Prior Functioning/Environment Level of  Independence: Needs assistance  Gait / Transfers Assistance Needed: amb with RW ADL's / Homemaking Assistance Needed: asssist for cooking and cleaning. Pt able to dress adn bath herself   Comments: pt cares for herself, doesn't really cook or drive, daughter checks in a few times a day and brings food        OT Problem List: Decreased strength;Decreased activity tolerance;Impaired balance (sitting and/or standing);Decreased safety awareness;Decreased knowledge of use of DME or AE;Decreased knowledge of precautions;Cardiopulmonary status limiting activity;Obesity;Pain      OT Treatment/Interventions:      OT Goals(Current goals can be found in the care plan section) Acute Rehab OT Goals Patient Stated Goal: home OT Goal Formulation: With patient/family Time For Goal Achievement: 03/20/17 Potential to Achieve Goals: Good ADL Goals Pt Will Perform Lower Body Bathing: with supervision;sit to/from stand Pt Will Perform Lower Body Dressing: with supervision;sit to/from stand Pt Will Transfer to Toilet: with supervision;bedside commode;ambulating Pt Will Perform Toileting - Clothing Manipulation and hygiene: with supervision;sit to/from stand Additional ADL Goal #1: pt/family will independently verbalize 3 strategies to reduce3 risk of falls  OT Frequency: Min 2X/week   Barriers to D/C:            Co-evaluation              End of Session Equipment Utilized During Treatment: Gait belt;Rolling walker Nurse Communication: Mobility status  Activity Tolerance: Patient tolerated treatment well Patient left: in bed;with call bell/phone within reach;with bed alarm set;with family/visitor present  OT Visit Diagnosis: Unsteadiness on feet (R26.81);Muscle weakness (generalized) (M62.81)                Time: 6147-0929 OT Time Calculation (min): 35 min Charges:  OT General Charges $OT Visit: 1 Procedure OT Evaluation $OT Eval Moderate Complexity: 1 Procedure OT Treatments $Self  Care/Home Management : 8-22 mins G-Codes:     [facility], OT/L  [phone] 03/06/2017  Gwen Edler,HILLARY 03/06/2017, 3:20 PM

## 2017-03-06 NOTE — Progress Notes (Signed)
PROGRESS NOTE    [name]  TOI:[id number] DOB: May 30, 1932 DOA: 03/04/2017 PCP: Kenn File, MD    Brief Narrative:  Patient is a 83 year old female history of coronary artery disease, chronic kidney disease stage IV, hypertension, diabetes, atrial fibrillation, hyperlipidemia, history of CVA recent NSTEMI during hospitalization of 11/17/2016-11/29/2016 which was treated medically as patient had refused cardiac catheterization. Patient presenting with nausea vomiting midsternal chest pain radiating to bilateral upper extremities. Patient with typical and atypical symptoms of chest pain. Likely GI versus cardiac. Patient admitted for further workup. GI and cardiology consulted.   Assessment & Plan:   Principal Problem:   Chest pain Active Problems:   Hyperlipidemia   DIASTOLIC HEART FAILURE, CHRONIC   Cerebrovascular disease   Uncontrolled hypertension   OA (osteoarthritis)   NSVT (nonsustained ventricular tachycardia) (HCC)   Overweight   HTN (hypertension)   Benign paroxysmal positional vertigo   CAD S/P PCI '03 and '08   Anemia in chronic kidney disease   Insulin dependent diabetes mellitus with complications (HCC)   Chronic kidney disease, stage IV (severe) (HCC)   PAF (paroxysmal atrial fibrillation) (HCC)   Esophagitis   Sinus node dysfunction ([place])   #1 chest pain Patient with significant extensive cardiac history recent NSTEMI December 2017 managed medically as patient had refused cardiac catheterization. Patient presented with chest pain with both typical and atypical features. Patient did complain of burning sensation however denied any odynophagia or dysphagia. Patient with some improvement with chest pain with GI cocktail. CT chest with diffuse esophagitis Cardiac enzymes minimally elevated. EKG with left bundle branch block, with some pronounced ST depression in leads V4 through V5. Patient with recent 2-D echo 11/17/2016 with EF of 65-70% with no regional  wall motion abnormalities and a such will not repeat 2-D echo. Patient has been seen in consultation by gastroenterology who feel at this time given patient's potential risks related to sedation and procedure as well as low likelihood you are recommending holding off on EGD, continue with empiric PPI and Carafate as well as cardiology consultation for further evaluation and management. Patient status post esophagram showing poor esophageal motility with esophageal spasms, negative for stricture or mass, negative for mucosal irregularity. His felt patient likely has a resumed reflux esophagitis and to continue PPI. Continue PPI, Carafate. Continue home regimen of cardiac medications of imdur, Norvasc, aspirin, Crestor. Discontinued IV Lopressor. Continue half home dose Coreg. Clonidine dose has been decreased per cardiology due to bradycardia and sinus pauses. Patient now considering cardiac catheterization however due to renal function likely hold off on cardiac catheterization at this time per cardiology. Patient scheduled for Myoview stress test tomorrow. Cardiology and GI following.   #2 presumed reflux esophagitis Noted on CT chest. Patient has been evaluated by gastroenterology who at this point in time a holding off on the EGD secondary to patient's potential risk so sedation and low likelihood. Patient is status post esophagram showing poor esophageal motility with esophageal spasms. No evidence of stricture or mass. No evidence of mucosal irregularity. Barium tablet progressed to distal esophagus but not into stomach felt to be due to poor peristalsis. Patient currently on calcium channel blocker of Norvasc. Will get a barium swallow for further evaluation. Continue PPI, sucralfate. Diet has been advanced per GI to a soft diet. No further workup needed per cardiology.  #3 chronic kidney disease stage IV Stable.  #4 hypertension Patient's clonidine dose decreased secondary to bradycardia and sinus  pauses. Continue Norvasc, imdur. Hydralazine has an  added to patient's regimen per cardiology for better blood pressure control.  #5 bradycardia/sinus pauses Cardiac enzymes minimally elevated. Patient with recent 2-D echo 11/17/2016 and as such will not repeat 2-D echo. Discontinued IV Lopressor. Patient has been seen in consultation by cardiology who recommended decreasing patient's clonidine dose and continuing current dose Coreg. Per cardiology patient not interested in pacemaker at this time. Cardiology following.  #6 paroxysmal atrial fibrillation Patient was on IV Lopressor however discontinued secondary to sinus pauses noted on telemetry. Resumed half home dose Coreg. Continue aspirin. Patient has been seen in consultation by cardiology who also recommended decreasing patient's clonidine dose due to sinus pauses/bradycardia. Cardiology following.  #7 hyperlipidemia LDL at 40. Continue statin.  #8 insulin-dependent diabetes mellitus Hemoglobin A1c was 6.1 on 11/17/2016. CBGs was 213 this morning. Continue sliding scale insulin. Follow.  #9 coronary artery disease/recent NSTEMI/chronic CHF See problem #1. Continue home regimen of Norvasc, aspirin, clonidine,imdur. Continue to hold diuretics. Discontinued IV Lopressor secondary to bradycardia/sinus pauses. Resumed half home dose Coreg. Patient for Myoview stress test tomorrow. Cardiology ff    DVT prophylaxis: Heparin Code Status: DO NOT RESUSCITATE Family Communication: Updated patient. No family at bedside. Disposition Plan: Home once chest pain resolved, sinus pauses evaluated and further evaluation per cardiology and GI.   Consultants:   Gastroenterology: Dr.Nandigam 03/05/2017  Cardiology : Dr. Lovena Le for eighth 2018  Procedures:   CT chest/CT abdomen and pelvis 03/04/2017  Chest x-ray 03/04/2017  Esophagram 03/06/2017  Antimicrobials:   None   Subjective: Patient sitting up in chair. Patient had chest pain  earlier on this morning however was given some GI cocktail with improvement. Patient denies any shortness of breath. Patient denies any emesis. Patient noted to have a 3.14 second pause then 1 beat then 2.5 second pause as well as bradycardia, per nursing the morning, of 03/05/2017. Patient states with ongoing chest pain is now open to cardiac catheterization.  Objective: Vitals:   03/06/17 0044 03/06/17 0100 03/06/17 0509 03/06/17 0847  BP: 135/86  (!) 144/47 (!) 165/70  Pulse: 97   89  Resp:   18 18  Temp:  98.1 F (36.7 C) 98.2 F (36.8 C) 97.8 F (36.6 C)  TempSrc:  Oral Oral Oral  SpO2: 93%   97%  Weight:      Height:        Intake/Output Summary (Last 24 hours) at 03/06/17 1153 Last data filed at 03/06/17 0849  Gross per 24 hour  Intake              360 ml  Output              300 ml  Net               60 ml   Filed Weights   03/04/17 1748 03/05/17 0447  Weight: 62.5 kg (137 lb 12.6 oz) 62.5 kg (137 lb 12.6 oz)    Examination:  General exam: Appears calm and comfortable  Respiratory system: Clear to auscultation. Respiratory effort normal. Cardiovascular system: S1 & S2 heard, RRR. No JVD, murmurs, rubs, gallops or clicks. No pedal edema. Gastrointestinal system: Abdomen is nondistended, soft and nontender. No organomegaly or masses felt. Normal bowel sounds heard. Central nervous system: Alert and oriented. No focal neurological deficits. Extremities: Symmetric 5 x 5 power. Skin: No rashes, lesions or ulcers Psychiatry: Judgement and insight appear fair. Mood & affect appropriate.     Data Reviewed: I have personally reviewed following labs and imaging  studies  CBC:  Recent Labs Lab 03/04/17 1206 03/05/17 0406 03/06/17 0649  WBC 5.6 5.2 7.8  NEUTROABS 4.2  --   --   HGB 8.6* 7.6* 8.1*  HCT 25.9* 23.4* 24.3*  MCV 87.8 89.0 88.7  PLT 194 208 202   Basic Metabolic Panel:  Recent Labs Lab 03/04/17 1206 03/04/17 1817 03/05/17 0406 03/06/17 0407  NA  138  --  141 137  K 4.5  --  5.0 5.2*  CL 103  --  105 104  CO2 21*  --  23 18*  GLUCOSE 205*  --  201* 260*  BUN 68*  --  67* 66*  CREATININE 3.05*  --  3.03* 3.05*  CALCIUM 9.9  --  9.8 9.5  MG  --  2.3  --   --    GFR: Estimated Creatinine Clearance: 12.3 mL/min (A) (by C-G formula based on SCr of 3.05 mg/dL (H)). Liver Function Tests:  Recent Labs Lab 03/04/17 1206  AST 12*  ALT 9*  ALKPHOS 67  BILITOT 0.5  PROT 6.4*  ALBUMIN 3.4*    Recent Labs Lab 03/04/17 1206  LIPASE 19   No results for input(s): AMMONIA in the last 168 hours. Coagulation Profile:  Recent Labs Lab 03/04/17 1206  INR 1.00   Cardiac Enzymes:  Recent Labs Lab 03/04/17 1817 03/04/17 2204 03/05/17 0406  TROPONINI <0.03 0.03* 0.05*   BNP (last 3 results) No results for input(s): PROBNP in the last 8760 hours. HbA1C: No results for input(s): HGBA1C in the last 72 hours. CBG:  Recent Labs Lab 03/05/17 0939  GLUCAP 213*   Lipid Profile:  Recent Labs  03/05/17 0406  CHOL 106  HDL 36*  LDLCALC 40  TRIG 150*  CHOLHDL 2.9   Thyroid Function Tests:  Recent Labs  03/04/17 1817  TSH 0.786   Anemia Panel:  Recent Labs  03/04/17 1817  VITAMINB12 545  FOLATE 30.5  FERRITIN 635*  TIBC 259  IRON 35  RETICCTPCT 1.9   Sepsis Labs: No results for input(s): PROCALCITON, LATICACIDVEN in the last 168 hours.  Recent Results (from the past 240 hour(s))  Urine culture     Status: Abnormal   Collection Time: 03/05/17  8:22 AM  Result Value Ref Range Status   Specimen Description URINE, CLEAN CATCH  Final   Special Requests NONE  Final   Culture MULTIPLE SPECIES PRESENT, SUGGEST RECOLLECTION (A)  Final   Report Status 03/06/2017 FINAL  Final         Radiology Studies: Ct Abdomen Pelvis Wo Contrast  Result Date: 03/04/2017 CLINICAL DATA:  Chest pain and nausea. EXAM: CT CHEST, ABDOMEN AND PELVIS WITHOUT CONTRAST TECHNIQUE: Multidetector CT imaging of the chest, abdomen  and pelvis was performed following the standard protocol without IV contrast. COMPARISON:  CT abdomen dated 05/14/2013. FINDINGS: CT CHEST FINDINGS Cardiovascular: Aortic atherosclerosis. No aortic aneurysm. Heart size is upper normal. Coronary artery calcifications throughout. Mediastinum/Nodes: Walls of the esophagus appear somewhat thickened throughout. No mass or enlarged lymph nodes appreciated within the mediastinum or perihilar regions. Trachea and central bronchi are unremarkable. Lungs/Pleura: Mild atelectasis/scarring at each lung base. Lungs otherwise clear. No pleural effusion or pneumothorax. Musculoskeletal: Degenerative changes within the thoracic spine, mild to moderate in degree. No acute or suspicious osseous finding. CT ABDOMEN PELVIS FINDINGS Hepatobiliary: Gallbladder is moderately distended but otherwise unremarkable with no evidence of acute cholecystitis. No bile duct dilatation. No focal liver abnormality. Pancreas: Unremarkable. No pancreatic ductal dilatation or surrounding inflammatory  changes. Spleen: Normal in size without focal abnormality. Adrenals/Urinary Tract: Adrenal glands are unremarkable. Left renal parapelvic cyst versus extrarenal pelves. No hydronephrosis bilaterally. No ureteral or bladder calculi identified. Stomach/Bowel: Fairly extensive diverticulosis of the descending and sigmoid colon. Scattered additional diverticulosis within the transverse colon. No focal inflammatory change to suggest acute diverticulitis. No dilated large or small bowel loops. Moderate-sized stool ball in the rectal vault. Appendix is not convincingly seen but there are no inflammatory changes about the cecum to suggest acute appendicitis. Vascular/Lymphatic: Aortic atherosclerosis. No enlarged lymph nodes within the abdomen or pelvis. Reproductive: Exophytic uterine fibroid, left fundal region, measuring approximately 4 cm greatest dimension. Otherwise unremarkable. Other: No free fluid or  abscess collection. No free intraperitoneal air. Musculoskeletal: Degenerative changes throughout the lumbar spine, mild to moderate in degree. No acute or suspicious osseous finding. IMPRESSION: 1. Walls of the esophagus appear somewhat thickened throughout suggesting a diffuse esophagitis of infectious or inflammatory nature. 2. Mild atelectasis/scarring at each lung base. No evidence of pneumonia. 3. Diffuse coronary artery calcifications. Recommend correlation with any possible associated cardiac symptoms. 4. Aortic atherosclerosis. 5. Colonic diverticulosis without evidence of acute diverticulitis. No bowel obstruction. 6. Uterine fibroid measuring approximately 4 cm. Electronically Signed   By: Franki Cabot M.D.   On: 03/04/2017 15:31   Dg Chest 2 View  Result Date: 03/04/2017 CLINICAL DATA:  83 year old female with a history of chest pain EXAM: CHEST  2 VIEW COMPARISON:  11/24/2016, 11/17/2016 FINDINGS: Right rotation of the patient, somewhat limits the evaluation. Cardiomediastinal silhouette likely unchanged. Right hilar region is accentuated likely secondary to the rotation of the patient. Calcifications of the aorta. Low lung volumes. No pleural effusion or pneumothorax. Coronary calcifications. No confluent airspace disease. Coarsened interstitial markings, similar to prior. IMPRESSION: Low lung volumes and chronic lung changes without evidence of acute cardiopulmonary disease. Aortic atherosclerosis and coronary disease. Electronically Signed   By: Corrie Mckusick D.O.   On: 03/04/2017 12:54   Ct Chest Wo Contrast  Result Date: 03/04/2017 CLINICAL DATA:  Chest pain and nausea. EXAM: CT CHEST, ABDOMEN AND PELVIS WITHOUT CONTRAST TECHNIQUE: Multidetector CT imaging of the chest, abdomen and pelvis was performed following the standard protocol without IV contrast. COMPARISON:  CT abdomen dated 05/14/2013. FINDINGS: CT CHEST FINDINGS Cardiovascular: Aortic atherosclerosis. No aortic aneurysm. Heart  size is upper normal. Coronary artery calcifications throughout. Mediastinum/Nodes: Walls of the esophagus appear somewhat thickened throughout. No mass or enlarged lymph nodes appreciated within the mediastinum or perihilar regions. Trachea and central bronchi are unremarkable. Lungs/Pleura: Mild atelectasis/scarring at each lung base. Lungs otherwise clear. No pleural effusion or pneumothorax. Musculoskeletal: Degenerative changes within the thoracic spine, mild to moderate in degree. No acute or suspicious osseous finding. CT ABDOMEN PELVIS FINDINGS Hepatobiliary: Gallbladder is moderately distended but otherwise unremarkable with no evidence of acute cholecystitis. No bile duct dilatation. No focal liver abnormality. Pancreas: Unremarkable. No pancreatic ductal dilatation or surrounding inflammatory changes. Spleen: Normal in size without focal abnormality. Adrenals/Urinary Tract: Adrenal glands are unremarkable. Left renal parapelvic cyst versus extrarenal pelves. No hydronephrosis bilaterally. No ureteral or bladder calculi identified. Stomach/Bowel: Fairly extensive diverticulosis of the descending and sigmoid colon. Scattered additional diverticulosis within the transverse colon. No focal inflammatory change to suggest acute diverticulitis. No dilated large or small bowel loops. Moderate-sized stool ball in the rectal vault. Appendix is not convincingly seen but there are no inflammatory changes about the cecum to suggest acute appendicitis. Vascular/Lymphatic: Aortic atherosclerosis. No enlarged lymph nodes within the abdomen or  pelvis. Reproductive: Exophytic uterine fibroid, left fundal region, measuring approximately 4 cm greatest dimension. Otherwise unremarkable. Other: No free fluid or abscess collection. No free intraperitoneal air. Musculoskeletal: Degenerative changes throughout the lumbar spine, mild to moderate in degree. No acute or suspicious osseous finding. IMPRESSION: 1. Walls of the  esophagus appear somewhat thickened throughout suggesting a diffuse esophagitis of infectious or inflammatory nature. 2. Mild atelectasis/scarring at each lung base. No evidence of pneumonia. 3. Diffuse coronary artery calcifications. Recommend correlation with any possible associated cardiac symptoms. 4. Aortic atherosclerosis. 5. Colonic diverticulosis without evidence of acute diverticulitis. No bowel obstruction. 6. Uterine fibroid measuring approximately 4 cm. Electronically Signed   By: Franki Cabot M.D.   On: 03/04/2017 15:31   Dg Esophagus  Result Date: 03/06/2017 CLINICAL DATA:  Chest pain. Abnormal esophageal thickening on CT chest EXAM: ESOPHOGRAM/BARIUM SWALLOW TECHNIQUE: Single contrast examination was performed using thin barium and barium tablet. FLUOROSCOPY TIME:  Fluoroscopy Time:  1 minutes 42 second Radiation Exposure Index (if provided by the fluoroscopic device): Number of Acquired Spot Images: 0 COMPARISON:  CT chest 03/04/2017 FINDINGS: Poor esophageal motility. There is esophageal spasm with uncoordinated peristalsis. Barium passes into the stomach without obstruction. No stricture or mass. Negative for hiatal hernia. No significant mucosal irregularity or ulceration. The patient swallowed a barium tablet which did not pass into the stomach. This is felt to be due to poor peristalsis and supine positioning. IMPRESSION: Poor esophageal motility with esophageal spasm. No evidence of stricture or mass. No evidence of mucosal irregularity. Barium tablet progressed to the distal esophagus but not into the stomach, felt to be due to poor peristalsis. No stricture. Electronically Signed   By: Franchot Gallo M.D.   On: 03/06/2017 08:40        Scheduled Meds: . amLODipine  10 mg Oral Daily  . aspirin  81 mg Oral Daily  . aspirin EC  81 mg Oral Once  . carvedilol  3.125 mg Oral BID WC  . cloNIDine  0.1 mg Oral BID  . gi cocktail  30 mL Oral Once  . heparin  5,000 Units Subcutaneous  Q8H  . hydrALAZINE  10 mg Oral Q8H  . isosorbide mononitrate  120 mg Oral Daily  . [START ON 03/07/2017] levothyroxine  50 mcg Oral QAC breakfast  .  morphine injection  4 mg Intravenous Once  . pantoprazole  40 mg Oral Q0600  . rosuvastatin  10 mg Oral q1800  . sucralfate  1 g Oral Q6H   Continuous Infusions: . nitroGLYCERIN       LOS: 0 days    Time spent: 73 mins    Jatara Huettner, MD [facility] Pager [phone]  If 7PM-7AM, please contact night-coverage [email or web address] Password [email or web address] 03/06/2017, 11:53 AM

## 2017-03-06 NOTE — Care Management Note (Signed)
Case Management Note  Patient Details  Name: [name] MRN: [id number] Date of Birth: October 27, 1932  Subjective/Objective:  Chest pain                  Action/Plan: Discharge Planning: NCM spoke to pt and she is active with [facility]. States Hospice was going to dc her next week.  States she has RW and 3n1 bedside commode at home. Lives at home with dtr, [name].   PCP Timmothy Euler   Expected Discharge Date:                  Expected Discharge Plan:  Home/Self Care  In-House Referral:  Clinical Social Work  Discharge planning Services  CM Consult  Post Acute Care Choice:  Hospice, Resumption of Svcs/PTA Provider Choice offered to:  Patient  DME Arranged:  N/A DME Agency:  NA  HH Arranged:  RN [place] Agency:  [facility]  Status of Service:  In process, will continue to follow  If discussed at Long Length of Stay Meetings, dates discussed:    Additional Comments:  Erenest Rasher, RN 03/06/2017, 5:48 PM

## 2017-03-06 NOTE — Progress Notes (Addendum)
Progress Note  Patient Name: [name] Date of Encounter: 03/06/2017  Primary Cardiologist: Hochrein  Subjective   Having mid sternal chest pressure - she has been having this since Dec but has refused cath in the past.  She is rethinking cath but Cr 3, Hgb now 8.6  Inpatient Medications    Scheduled Meds: . amLODipine  10 mg Oral Daily  . aspirin  81 mg Oral Daily  . aspirin EC  81 mg Oral Once  . carvedilol  3.125 mg Oral BID WC  . cloNIDine  0.1 mg Oral BID  . gi cocktail  30 mL Oral Once  . heparin  5,000 Units Subcutaneous Q8H  . isosorbide mononitrate  120 mg Oral Daily  . levothyroxine  25 mcg Intravenous Q24H  .  morphine injection  4 mg Intravenous Once  . pantoprazole  40 mg Oral Q0600  . rosuvastatin  10 mg Oral q1800  . sucralfate  1 g Oral Q6H   Continuous Infusions:  PRN Meds: acetaminophen, calcium carbonate, gi cocktail, hydrALAZINE, HYDROcodone-acetaminophen, morphine injection, nitroGLYCERIN, ondansetron (ZOFRAN) IV, prochlorperazine   Vital Signs    Vitals:   03/06/17 0044 03/06/17 0100 03/06/17 0509 03/06/17 0847  BP: 135/86  (!) 144/47 (!) 165/70  Pulse: 97   89  Resp:   18 18  Temp:  98.1 F (36.7 C) 98.2 F (36.8 C) 97.8 F (36.6 C)  TempSrc:  Oral Oral Oral  SpO2: 93%   97%  Weight:      Height:        Intake/Output Summary (Last 24 hours) at 03/06/17 1009 Last data filed at 03/06/17 0849  Gross per 24 hour  Intake              360 ml  Output              300 ml  Net               60 ml   Filed Weights   03/04/17 1748 03/05/17 0447  Weight: 137 lb 12.6 oz (62.5 kg) 137 lb 12.6 oz (62.5 kg)    Telemetry    SR to ST, no pauses since yesterday - Personally Reviewed  ECG    Last pm with chest pain  SR with incomplete LBBB  but no acute changes- Personally Reviewed  Physical Exam   GEN: + acute chest pressure now no associated symptoms.    Neck: + JVD Cardiac: RRR, + systolic murmurs,no  rubs, or gallops.    Respiratory: few rales in the bases, no wheezes or rhonchi. GI: Soft, nontender, non-distended  MS: No edema; No deformity. Neuro:  Nonfocal  Psych: Normal affect   Labs    Chemistry Recent Labs Lab 03/04/17 1206 03/05/17 0406 03/06/17 0407  NA 138 141 137  K 4.5 5.0 5.2*  CL 103 105 104  CO2 21* 23 18*  GLUCOSE 205* 201* 260*  BUN 68* 67* 66*  CREATININE 3.05* 3.03* 3.05*  CALCIUM 9.9 9.8 9.5  PROT 6.4*  --   --   ALBUMIN 3.4*  --   --   AST 12*  --   --   ALT 9*  --   --   ALKPHOS 67  --   --   BILITOT 0.5  --   --   GFRNONAA 13* 13* 13*  GFRAA 15* 15* 15*  ANIONGAP 14 13 15      Hematology Recent Labs Lab 03/04/17 1206 03/04/17 1817  03/05/17 0406 03/06/17 0649  WBC 5.6  --  5.2 7.8  RBC 2.95* 3.09* 2.63* 2.74*  HGB 8.6*  --  7.6* 8.1*  HCT 25.9*  --  23.4* 24.3*  MCV 87.8  --  89.0 88.7  MCH 29.2  --  28.9 29.6  MCHC 33.2  --  32.5 33.3  RDW 12.7  --  13.0 13.1  PLT 194  --  208 174    Cardiac Enzymes Recent Labs Lab 03/04/17 1817 03/04/17 2204 03/05/17 0406  TROPONINI <0.03 0.03* 0.05*    Recent Labs Lab 03/04/17 1224 03/04/17 1601  TROPIPOC 0.01 0.02     BNPNo results for input(s): BNP, PROBNP in the last 168 hours.   DDimer No results for input(s): DDIMER in the last 168 hours.   Radiology    Ct Abdomen Pelvis Wo Contrast  Result Date: 03/04/2017 CLINICAL DATA:  Chest pain and nausea. EXAM: CT CHEST, ABDOMEN AND PELVIS WITHOUT CONTRAST TECHNIQUE: Multidetector CT imaging of the chest, abdomen and pelvis was performed following the standard protocol without IV contrast. COMPARISON:  CT abdomen dated 05/14/2013. FINDINGS: CT CHEST FINDINGS Cardiovascular: Aortic atherosclerosis. No aortic aneurysm. Heart size is upper normal. Coronary artery calcifications throughout. Mediastinum/Nodes: Walls of the esophagus appear somewhat thickened throughout. No mass or enlarged lymph nodes appreciated within the mediastinum or perihilar regions.  Trachea and central bronchi are unremarkable. Lungs/Pleura: Mild atelectasis/scarring at each lung base. Lungs otherwise clear. No pleural effusion or pneumothorax. Musculoskeletal: Degenerative changes within the thoracic spine, mild to moderate in degree. No acute or suspicious osseous finding. CT ABDOMEN PELVIS FINDINGS Hepatobiliary: Gallbladder is moderately distended but otherwise unremarkable with no evidence of acute cholecystitis. No bile duct dilatation. No focal liver abnormality. Pancreas: Unremarkable. No pancreatic ductal dilatation or surrounding inflammatory changes. Spleen: Normal in size without focal abnormality. Adrenals/Urinary Tract: Adrenal glands are unremarkable. Left renal parapelvic cyst versus extrarenal pelves. No hydronephrosis bilaterally. No ureteral or bladder calculi identified. Stomach/Bowel: Fairly extensive diverticulosis of the descending and sigmoid colon. Scattered additional diverticulosis within the transverse colon. No focal inflammatory change to suggest acute diverticulitis. No dilated large or small bowel loops. Moderate-sized stool ball in the rectal vault. Appendix is not convincingly seen but there are no inflammatory changes about the cecum to suggest acute appendicitis. Vascular/Lymphatic: Aortic atherosclerosis. No enlarged lymph nodes within the abdomen or pelvis. Reproductive: Exophytic uterine fibroid, left fundal region, measuring approximately 4 cm greatest dimension. Otherwise unremarkable. Other: No free fluid or abscess collection. No free intraperitoneal air. Musculoskeletal: Degenerative changes throughout the lumbar spine, mild to moderate in degree. No acute or suspicious osseous finding. IMPRESSION: 1. Walls of the esophagus appear somewhat thickened throughout suggesting a diffuse esophagitis of infectious or inflammatory nature. 2. Mild atelectasis/scarring at each lung base. No evidence of pneumonia. 3. Diffuse coronary artery calcifications.  Recommend correlation with any possible associated cardiac symptoms. 4. Aortic atherosclerosis. 5. Colonic diverticulosis without evidence of acute diverticulitis. No bowel obstruction. 6. Uterine fibroid measuring approximately 4 cm. Electronically Signed   By: Franki Cabot M.D.   On: 03/04/2017 15:31   Dg Chest 2 View  Result Date: 03/04/2017 CLINICAL DATA:  83 year old female with a history of chest pain EXAM: CHEST  2 VIEW COMPARISON:  11/24/2016, 11/17/2016 FINDINGS: Right rotation of the patient, somewhat limits the evaluation. Cardiomediastinal silhouette likely unchanged. Right hilar region is accentuated likely secondary to the rotation of the patient. Calcifications of the aorta. Low lung volumes. No pleural effusion or pneumothorax. Coronary calcifications. No  confluent airspace disease. Coarsened interstitial markings, similar to prior. IMPRESSION: Low lung volumes and chronic lung changes without evidence of acute cardiopulmonary disease. Aortic atherosclerosis and coronary disease. Electronically Signed   By: Corrie Mckusick D.O.   On: 03/04/2017 12:54   Ct Chest Wo Contrast  Result Date: 03/04/2017 CLINICAL DATA:  Chest pain and nausea. EXAM: CT CHEST, ABDOMEN AND PELVIS WITHOUT CONTRAST TECHNIQUE: Multidetector CT imaging of the chest, abdomen and pelvis was performed following the standard protocol without IV contrast. COMPARISON:  CT abdomen dated 05/14/2013. FINDINGS: CT CHEST FINDINGS Cardiovascular: Aortic atherosclerosis. No aortic aneurysm. Heart size is upper normal. Coronary artery calcifications throughout. Mediastinum/Nodes: Walls of the esophagus appear somewhat thickened throughout. No mass or enlarged lymph nodes appreciated within the mediastinum or perihilar regions. Trachea and central bronchi are unremarkable. Lungs/Pleura: Mild atelectasis/scarring at each lung base. Lungs otherwise clear. No pleural effusion or pneumothorax. Musculoskeletal: Degenerative changes within the  thoracic spine, mild to moderate in degree. No acute or suspicious osseous finding. CT ABDOMEN PELVIS FINDINGS Hepatobiliary: Gallbladder is moderately distended but otherwise unremarkable with no evidence of acute cholecystitis. No bile duct dilatation. No focal liver abnormality. Pancreas: Unremarkable. No pancreatic ductal dilatation or surrounding inflammatory changes. Spleen: Normal in size without focal abnormality. Adrenals/Urinary Tract: Adrenal glands are unremarkable. Left renal parapelvic cyst versus extrarenal pelves. No hydronephrosis bilaterally. No ureteral or bladder calculi identified. Stomach/Bowel: Fairly extensive diverticulosis of the descending and sigmoid colon. Scattered additional diverticulosis within the transverse colon. No focal inflammatory change to suggest acute diverticulitis. No dilated large or small bowel loops. Moderate-sized stool ball in the rectal vault. Appendix is not convincingly seen but there are no inflammatory changes about the cecum to suggest acute appendicitis. Vascular/Lymphatic: Aortic atherosclerosis. No enlarged lymph nodes within the abdomen or pelvis. Reproductive: Exophytic uterine fibroid, left fundal region, measuring approximately 4 cm greatest dimension. Otherwise unremarkable. Other: No free fluid or abscess collection. No free intraperitoneal air. Musculoskeletal: Degenerative changes throughout the lumbar spine, mild to moderate in degree. No acute or suspicious osseous finding. IMPRESSION: 1. Walls of the esophagus appear somewhat thickened throughout suggesting a diffuse esophagitis of infectious or inflammatory nature. 2. Mild atelectasis/scarring at each lung base. No evidence of pneumonia. 3. Diffuse coronary artery calcifications. Recommend correlation with any possible associated cardiac symptoms. 4. Aortic atherosclerosis. 5. Colonic diverticulosis without evidence of acute diverticulitis. No bowel obstruction. 6. Uterine fibroid measuring  approximately 4 cm. Electronically Signed   By: Franki Cabot M.D.   On: 03/04/2017 15:31   Dg Esophagus  Result Date: 03/06/2017 CLINICAL DATA:  Chest pain. Abnormal esophageal thickening on CT chest EXAM: ESOPHOGRAM/BARIUM SWALLOW TECHNIQUE: Single contrast examination was performed using thin barium and barium tablet. FLUOROSCOPY TIME:  Fluoroscopy Time:  1 minutes 42 second Radiation Exposure Index (if provided by the fluoroscopic device): Number of Acquired Spot Images: 0 COMPARISON:  CT chest 03/04/2017 FINDINGS: Poor esophageal motility. There is esophageal spasm with uncoordinated peristalsis. Barium passes into the stomach without obstruction. No stricture or mass. Negative for hiatal hernia. No significant mucosal irregularity or ulceration. The patient swallowed a barium tablet which did not pass into the stomach. This is felt to be due to poor peristalsis and supine positioning. IMPRESSION: Poor esophageal motility with esophageal spasm. No evidence of stricture or mass. No evidence of mucosal irregularity. Barium tablet progressed to the distal esophagus but not into the stomach, felt to be due to poor peristalsis. No stricture. Electronically Signed   By: Franchot Gallo M.D.  On: 03/06/2017 08:40    Cardiac Studies   None this admit  Patient Profile     83 y.o. female with history of insulin-dependent diabetes mellitus, hypertension, chronic kidney disease stage V, coronary artery disease with LAD balloon angioplasty in 2003 and LAD DES and OM DES in 2008. She was admitted in 2013 for an NSTEMI but declined cardiac cath and was medically managed.Patient was hospitalized about 6 weeks back  with chest pain and was ruled out for ACS but found to have accelerated hypertension. Has had NSTEMI in Dec/17 and again refused cardiac cath.  Now with pauses and chest pain.  Dr. Lovena Le saw for pain and long pauses and recommended decreasing Clonidine.  She has refused PPM.  Assessment & Plan      1. Chest pain with hx of CAD and has refused cath on multiple occasions.  Troponin 0.03 to 0.05.  Suspect trop bump secondary to poorly controlled BP and CHF.  --she had episodes of CP last pm - Will Start IV NTG gtt. - She now wants to consider cath   2.  Asymptomatic pauses, clonidine decreased and continue coreg.  Pt not interested in PPM at this time, but may consider if absolutely has to.  Will continue on monitor on tele.  3.  HTN - BP is elevated today with lower dose of clonidine at 165/70 to 154/55 will add NTG gtt with her chest pressure.   4.  CAD with hx PCI, 2008.  Refuses repeat cath though she is rethinking now.   5.  Acute on chronic systolic HF.  She is net positive 405cc.  Does not appear volume overloaded on exam.  Demadex on hold due to AKI.   6.  Acute anemia admit hgb 7.6. Now 8.6,  Appears this is chronic since Dec. GI has seen EPO ordered.  C/w anemia of chronic disease and iron studies were normal.  Signed, Cecilie Kicks, NP  03/06/2017, 10:09 AM    Patient seen and independently examined with Cecilie Kicks, NP. We discussed all aspects of the encounter. I agree with the assessment and plan as stated above with minor modifications.  Patient admitted with CP and minimally elevated trop at 0.03 and 0.05.  Had another episode of CP last night.  She has refused cath on multiple occasional recently with several hospitalizations for CP and NSTEMI in 2017.  She has known CAD with remote DES to the LAD and OM.  She now wants to consider cath but her renal function has worsened and her creatinine is 3.05.  I do not recommend proceeding with heart cath unless we have objective evidence of coronary ischemia and then would proceed only if a large area of ischemia is present, o/w Rx medically.  Starting IV NTG gtt for now. She is already on maximum dose long acting nitrates.  Will make NPO after MN and plan Lexiscan myoview in am.  May need to consider low dose Ranexa.  Continue  amlodipine.  Cannot increase BB further due to pauses recently.  Continue low dose Clonidine but would not titrate further due to history of sinus pauses.  Will add Hydralazine 10mg  TID for better BP control.   Signed, Fransico Him, MD [facility] 03/06/2017

## 2017-03-06 NOTE — Progress Notes (Signed)
Pt complains of 4/10 Chest pain. BP 167/69. Ntg gtt increased to 87mcg/3cc hr will continue to monitor. Jessie Foot, RN

## 2017-03-06 NOTE — Care Management Obs Status (Signed)
[place] NOTIFICATION   Patient Details  Name: [name] MRN: [id number] Date of Birth: August 05, 1932   Medicare Observation Status Notification Given:  Yes    Erenest Rasher, RN 03/06/2017, 5:42 PM

## 2017-03-06 NOTE — Progress Notes (Signed)
Inpatient Diabetes Program Recommendations  AACE/ADA: New Consensus Statement on Inpatient Glycemic Control (2015)  Target Ranges:  Prepandial:   less than 140 mg/dL      Peak postprandial:   less than 180 mg/dL (1-2 hours)      Critically ill patients:  140 - 180 mg/dL   Results for CARMIE, LANPHER (MRN [id number]) as of 03/06/2017 15:27  Ref. Range 03/05/2017 09:39 03/06/2017 12:13  Glucose-Capillary Latest Ref Range: 65 - 99 mg/dL 213 (H) 251 (H)   Review of Glycemic Control  Diabetes history: DM2 Outpatient Diabetes medications: NPH 10 units BID Current orders for Inpatient glycemic control: Novolog 0-9 units TID with meals  Inpatient Diabetes Program Recommendations: Insulin - Basal: Please consider ordering Lantus 6 units Q24H (based on 62 kg x 0.1 units). Correction (SSI): Please consider ordering Novolog bedtime correction scale.  Thanks, Barnie Alderman, RN, MSN, CDE Diabetes Coordinator Inpatient Diabetes Program [phone] (Team Pager from 8am to 5pm)

## 2017-03-07 ENCOUNTER — Ambulatory Visit: Payer: Medicare Other | Admitting: Family Medicine

## 2017-03-07 ENCOUNTER — Inpatient Hospital Stay (HOSPITAL_COMMUNITY): Payer: Medicare Other

## 2017-03-07 DIAGNOSIS — K209 Esophagitis, unspecified: Secondary | ICD-10-CM

## 2017-03-07 DIAGNOSIS — N184 Chronic kidney disease, stage 4 (severe): Secondary | ICD-10-CM

## 2017-03-07 DIAGNOSIS — Z9861 Coronary angioplasty status: Secondary | ICD-10-CM

## 2017-03-07 DIAGNOSIS — R918 Other nonspecific abnormal finding of lung field: Secondary | ICD-10-CM

## 2017-03-07 DIAGNOSIS — H8110 Benign paroxysmal vertigo, unspecified ear: Secondary | ICD-10-CM

## 2017-03-07 DIAGNOSIS — E118 Type 2 diabetes mellitus with unspecified complications: Secondary | ICD-10-CM

## 2017-03-07 DIAGNOSIS — I5032 Chronic diastolic (congestive) heart failure: Secondary | ICD-10-CM

## 2017-03-07 DIAGNOSIS — I10 Essential (primary) hypertension: Secondary | ICD-10-CM

## 2017-03-07 DIAGNOSIS — R072 Precordial pain: Secondary | ICD-10-CM

## 2017-03-07 DIAGNOSIS — Z794 Long term (current) use of insulin: Secondary | ICD-10-CM

## 2017-03-07 DIAGNOSIS — I480 Paroxysmal atrial fibrillation: Secondary | ICD-10-CM

## 2017-03-07 DIAGNOSIS — I2510 Atherosclerotic heart disease of native coronary artery without angina pectoris: Secondary | ICD-10-CM

## 2017-03-07 DIAGNOSIS — R079 Chest pain, unspecified: Secondary | ICD-10-CM

## 2017-03-07 LAB — ECHOCARDIOGRAM LIMITED
E decel time: 156 msec
E/e' ratio: 20.22
FS: 26 % — AB (ref 28–44)
Height: 64 in
IVS/LV PW RATIO, ED: 0.9
LDCA: 2.84 cm2
LV E/e' medial: 20.22
LV TDI E'MEDIAL: 4.13
LVEEAVG: 20.22
LVELAT: 9.25 cm/s
LVOTD: 19 mm
MRPISAEROA: 0.13 cm2
MV Dec: 156
MV VTI: 180 cm
MV pk A vel: 153 m/s
MVPG: 14 mmHg
MVPKEVEL: 187 m/s
PW: 13 mm — AB (ref 0.6–1.1)
TDI e' lateral: 9.25
Weight: 2251.2 oz

## 2017-03-07 LAB — BASIC METABOLIC PANEL
Anion gap: 9 (ref 5–15)
BUN: 56 mg/dL — ABNORMAL HIGH (ref 6–20)
CALCIUM: 10 mg/dL (ref 8.9–10.3)
CO2: 22 mmol/L (ref 22–32)
CREATININE: 2.78 mg/dL — AB (ref 0.44–1.00)
Chloride: 107 mmol/L (ref 101–111)
GFR calc Af Amer: 17 mL/min — ABNORMAL LOW (ref >60)
GFR calc non Af Amer: 15 mL/min — ABNORMAL LOW (ref >60)
GLUCOSE: 213 mg/dL — AB (ref 65–99)
Potassium: 4.6 mmol/L (ref 3.5–5.1)
Sodium: 138 mmol/L (ref 135–145)

## 2017-03-07 LAB — HEMOGLOBIN A1C
Hgb A1c MFr Bld: 6.6 % — ABNORMAL HIGH (ref 4.8–5.6)
MEAN PLASMA GLUCOSE: 143 mg/dL

## 2017-03-07 LAB — CBC
HEMATOCRIT: 24.6 % — AB (ref 36.0–46.0)
Hemoglobin: 7.9 g/dL — ABNORMAL LOW (ref 12.0–15.0)
MCH: 28.8 pg (ref 26.0–34.0)
MCHC: 32.1 g/dL (ref 30.0–36.0)
MCV: 89.8 fL (ref 78.0–100.0)
PLATELETS: 198 10*3/uL (ref 150–400)
RBC: 2.74 MIL/uL — ABNORMAL LOW (ref 3.87–5.11)
RDW: 13.3 % (ref 11.5–15.5)
WBC: 6.6 10*3/uL (ref 4.0–10.5)

## 2017-03-07 LAB — NM MYOCAR MULTI W/SPECT W/WALL MOTION / EF
CSEPEDS: 1 s
CSEPPHR: 88 {beats}/min
Estimated workload: 1 METS
Exercise duration (min): 6 min
Rest HR: 76 {beats}/min

## 2017-03-07 LAB — GLUCOSE, CAPILLARY
Glucose-Capillary: 205 mg/dL — ABNORMAL HIGH (ref 65–99)
Glucose-Capillary: 150 mg/dL — ABNORMAL HIGH (ref 65–99)
Glucose-Capillary: 212 mg/dL — ABNORMAL HIGH (ref 65–99)

## 2017-03-07 MED ORDER — TECHNETIUM TC 99M TETROFOSMIN IV KIT
30.0000 | PACK | Freq: Once | INTRAVENOUS | Status: AC | PRN
  Administered 2017-03-07: 30 via INTRAVENOUS

## 2017-03-07 MED ORDER — REGADENOSON 0.4 MG/5ML IV SOLN
INTRAVENOUS | Status: AC
  Administered 2017-03-07: 0.4 mg via INTRAVENOUS
  Filled 2017-03-07: qty 5

## 2017-03-07 MED ORDER — INSULIN GLARGINE 100 UNIT/ML ~~LOC~~ SOLN: 6.0000 [IU] | Freq: Every day | SUBCUTANEOUS | Status: DC

## 2017-03-07 MED ORDER — ONDANSETRON HCL 4 MG/2ML IJ SOLN
INTRAMUSCULAR | Status: AC
  Administered 2017-03-07: 4 mg via INTRAVENOUS
  Filled 2017-03-07: qty 2

## 2017-03-07 MED ORDER — INSULIN GLARGINE 100 UNIT/ML ~~LOC~~ SOLN
3.0000 [IU] | Freq: Every day | SUBCUTANEOUS | Status: DC
  Administered 2017-03-07 – 2017-03-09 (×3): 3 [IU] via SUBCUTANEOUS
  Filled 2017-03-07 (×3): qty 0.03

## 2017-03-07 MED ORDER — TECHNETIUM TC 99M TETROFOSMIN IV KIT
10.0000 | PACK | Freq: Once | INTRAVENOUS | Status: AC | PRN
  Administered 2017-03-07: 10 via INTRAVENOUS

## 2017-03-07 MED ORDER — REGADENOSON 0.4 MG/5ML IV SOLN
0.4000 mg | Freq: Once | INTRAVENOUS | Status: AC
  Administered 2017-03-07: 0.4 mg via INTRAVENOUS
  Filled 2017-03-07: qty 5

## 2017-03-07 NOTE — Consult Note (Signed)
   [facility] CM Inpatient Consult   03/07/2017  [name] 1932-04-22 [id number]    Patient screened for [facility] program. She was active with [facility] in the past but was discharged due to transition to home hospice services back in January. Chart reviewed. Noted patient is active with [facility]. Therefore, [facility] would not be appropriate at this time. Discussed with inpatient RNCM.    Marthenia Rolling, MSN-Ed, RN,BSN [facility] Liaison [phone]

## 2017-03-07 NOTE — Progress Notes (Signed)
PROGRESS NOTE    [name]  GXQ:[id number] DOB: 09/12/1932 DOA: 03/04/2017 PCP: Kenn File, MD    Brief Narrative:  Patient is a 83 year old female history of coronary artery disease, chronic kidney disease stage IV, hypertension, diabetes, atrial fibrillation, hyperlipidemia, history of CVA recent NSTEMI during hospitalization of 11/17/2016-11/29/2016 which was treated medically as patient had refused cardiac catheterization. Patient presenting with nausea vomiting midsternal chest pain radiating to bilateral upper extremities. Patient with typical and atypical symptoms of chest pain. Likely GI versus cardiac. Patient admitted for further workup. GI and cardiology consulted.   Assessment & Plan:   Principal Problem:   Chest pain Active Problems:   Hyperlipidemia   DIASTOLIC HEART FAILURE, CHRONIC   Cerebrovascular disease   Uncontrolled hypertension   OA (osteoarthritis)   NSVT (nonsustained ventricular tachycardia) (HCC)   Overweight   HTN (hypertension)   Benign paroxysmal positional vertigo   CAD S/P PCI '03 and '08   Anemia in chronic kidney disease   Insulin dependent diabetes mellitus with complications (HCC)   Chronic kidney disease, stage IV (severe) (HCC)   PAF (paroxysmal atrial fibrillation) (HCC)   Esophagitis   Sinus node dysfunction (HCC)   Abnormal CT scan of lung   #1 chest pain Patient with significant extensive cardiac history recent NSTEMI December 2017 managed medically as patient had refused cardiac catheterization. Patient presented with chest pain with both typical and atypical features. Patient did complain of burning sensation however denied any odynophagia or dysphagia. Patient with some improvement with chest pain with GI cocktail. CT chest with diffuse esophagitis Cardiac enzymes minimally elevated. EKG with left bundle branch block, with some pronounced ST depression in leads V4 through V5. Patient with recent 2-D echo 11/17/2016 with EF of  65-70% with no regional wall motion abnormalities. Repeat 2-D echo pending. Patient has been seen in consultation by gastroenterology who feel at this time given patient's potential risks related to sedation and procedure as well as low likelihood you are recommending holding off on EGD, continue with empiric PPI and Carafate as well as cardiology consultation for further evaluation and management. Patient status post esophagram showing poor esophageal motility with esophageal spasms, negative for stricture or mass, negative for mucosal irregularity. His felt patient likely has a resumed reflux esophagitis and to continue PPI. Continue PPI, Carafate. Continue home regimen of cardiac medications of imdur, Norvasc, aspirin, Crestor. Discontinued IV Lopressor. Continue half home dose Coreg. Clonidine dose has been decreased per cardiology due to bradycardia and sinus pauses. Patient now considering cardiac catheterization however due to renal function likely hold off on cardiac catheterization at this time per cardiology. Patient s/p  Myoview stress 03/07/2017. Results pending. Cardiology and GI following.   #2 presumed reflux esophagitis Noted on CT chest. Patient has been evaluated by gastroenterology who at this point in time a holding off on the EGD secondary to patient's potential risk so sedation and low likelihood. Patient is status post esophagram showing poor esophageal motility with esophageal spasms. No evidence of stricture or mass. No evidence of mucosal irregularity. Barium tablet progressed to distal esophagus but not into stomach felt to be due to poor peristalsis. Patient currently on calcium channel blocker of Norvasc. Continue PPI, sucralfate. Diet has been advanced per GI to a soft diet. No further workup needed per GI.Marland Kitchen  #3 chronic kidney disease stage IV Stable.  #4 hypertension Patient's clonidine dose decreased secondary to bradycardia and sinus pauses. Continue Norvasc, imdur.  Hydralazine has an added to patient's regimen  per cardiology for better blood pressure control.  #5 bradycardia/sinus pauses Cardiac enzymes minimally elevated. Patient with recent 2-D echo 11/17/2016. Repeat 2-D echo ordered per cardiology pending. Discontinued IV Lopressor. Patient has been seen in consultation by cardiology who recommended decreasing patient's clonidine dose and continuing current dose Coreg. Per cardiology patient not interested in pacemaker at this time. Cardiology following.  #6 paroxysmal atrial fibrillation Patient was on IV Lopressor however discontinued secondary to sinus pauses noted on telemetry. Resumed half home dose Coreg. Continue aspirin. Patient has been seen in consultation by cardiology who also recommended decreasing patient's clonidine dose due to sinus pauses/bradycardia. Cardiology following.  #7 hyperlipidemia LDL at 40. Continue statin.  #8 insulin-dependent diabetes mellitus Hemoglobin A1c was 6.1 on 11/17/2016. CBGs ranging from 150 -205. Continue sliding scale insulin. Follow.  #9 coronary artery disease/recent NSTEMI/chronic CHF See problem #1. Continue home regimen of Norvasc, aspirin, clonidine,imdur. Continue to hold diuretics. Discontinued IV Lopressor secondary to bradycardia/sinus pauses. Resumed half home dose Coreg. Patient s/p Myoview stress test 03/07/2017. 2-D echo pending Cardiology ff    DVT prophylaxis: Heparin Code Status: DO NOT RESUSCITATE Family Communication: Updated patient. No family at bedside. Disposition Plan: Home once chest pain resolved, sinus pauses evaluated and further evaluation per cardiology and GI.   Consultants:   Gastroenterology: Dr.Nandigam 03/05/2017  Cardiology : Dr. Lovena Le 03/05/2017  Procedures:   CT chest/CT abdomen and pelvis 03/04/2017  Chest x-ray 03/04/2017  Esophagram 03/06/2017  Myoview stress test 03/07/2017  2-D echo 03/07/2017  Antimicrobials:   None   Subjective: Patient  sitting up in chair. Patient chest pain yesterday and placed on a nitroglycerin drip. Nitroglycerin drip discontinued and patient for nuclear medicine stress test today. Patient still complaining of chest pain. No nausea. At an episode of emesis during the medicine stress test.  Patient denies any emesis. Patient noted to have a 3.14 second pause then 1 beat then 2.5 second pause as well as bradycardia, per nursing the morning, of 03/05/2017.   Objective: Vitals:   03/07/17 1136 03/07/17 1138 03/07/17 1140 03/07/17 1307  BP: (!) 137/57 (!) 135/47 (!) 133/46 (!) 145/49  Pulse: 87 88 86 82  Resp:    20  Temp:    98.2 F (36.8 C)  TempSrc:    Oral  SpO2:    100%  Weight:      Height:        Intake/Output Summary (Last 24 hours) at 03/07/17 1616 Last data filed at 03/07/17 1000  Gross per 24 hour  Intake            127.3 ml  Output              800 ml  Net           -672.7 ml   Filed Weights   03/04/17 1748 03/05/17 0447 03/07/17 0655  Weight: 62.5 kg (137 lb 12.6 oz) 62.5 kg (137 lb 12.6 oz) 63.8 kg (140 lb 11.2 oz)    Examination:  General exam: Appears calm and comfortable  Respiratory system: Clear to auscultation. Respiratory effort normal. Cardiovascular system: S1 & S2 heard, RRR. No JVD, murmurs, rubs, gallops or clicks. No pedal edema. Gastrointestinal system: Abdomen is nondistended, soft and nontender. No organomegaly or masses felt. Normal bowel sounds heard. Central nervous system: Alert and oriented. No focal neurological deficits. Extremities: Symmetric 5 x 5 power. Skin: No rashes, lesions or ulcers Psychiatry: Judgement and insight appear fair. Mood & affect appropriate.  Data Reviewed: I have personally reviewed following labs and imaging studies  CBC:  Recent Labs Lab 03/04/17 1206 03/05/17 0406 03/06/17 0649 03/07/17 0652  WBC 5.6 5.2 7.8 6.6  NEUTROABS 4.2  --   --   --   HGB 8.6* 7.6* 8.1* 7.9*  HCT 25.9* 23.4* 24.3* 24.6*  MCV 87.8 89.0  88.7 89.8  PLT 194 208 174 989   Basic Metabolic Panel:  Recent Labs Lab 03/04/17 1206 03/04/17 1817 03/05/17 0406 03/06/17 0407 03/07/17 0652  NA 138  --  141 137 138  K 4.5  --  5.0 5.2* 4.6  CL 103  --  105 104 107  CO2 21*  --  23 18* 22  GLUCOSE 205*  --  201* 260* 213*  BUN 68*  --  67* 66* 56*  CREATININE 3.05*  --  3.03* 3.05* 2.78*  CALCIUM 9.9  --  9.8 9.5 10.0  MG  --  2.3  --   --   --    GFR: Estimated Creatinine Clearance: 13.5 mL/min (A) (by C-G formula based on SCr of 2.78 mg/dL (H)). Liver Function Tests:  Recent Labs Lab 03/04/17 1206  AST 12*  ALT 9*  ALKPHOS 67  BILITOT 0.5  PROT 6.4*  ALBUMIN 3.4*    Recent Labs Lab 03/04/17 1206  LIPASE 19   No results for input(s): AMMONIA in the last 168 hours. Coagulation Profile:  Recent Labs Lab 03/04/17 1206  INR 1.00   Cardiac Enzymes:  Recent Labs Lab 03/04/17 1817 03/04/17 2204 03/05/17 0406  TROPONINI <0.03 0.03* 0.05*   BNP (last 3 results) No results for input(s): PROBNP in the last 8760 hours. HbA1C:  Recent Labs  03/06/17 1215  HGBA1C 6.6*   CBG:  Recent Labs Lab 03/06/17 1213 03/06/17 1634 03/06/17 2045 03/07/17 0751 03/07/17 1306  GLUCAP 251* 184* 165* 205* 150*   Lipid Profile:  Recent Labs  03/05/17 0406  CHOL 106  HDL 36*  LDLCALC 40  TRIG 150*  CHOLHDL 2.9   Thyroid Function Tests:  Recent Labs  03/04/17 1817  TSH 0.786   Anemia Panel:  Recent Labs  03/04/17 1817  VITAMINB12 545  FOLATE 30.5  FERRITIN 635*  TIBC 259  IRON 35  RETICCTPCT 1.9   Sepsis Labs: No results for input(s): PROCALCITON, LATICACIDVEN in the last 168 hours.  Recent Results (from the past 240 hour(s))  Urine culture     Status: Abnormal   Collection Time: 03/05/17  8:22 AM  Result Value Ref Range Status   Specimen Description URINE, CLEAN CATCH  Final   Special Requests NONE  Final   Culture MULTIPLE SPECIES PRESENT, SUGGEST RECOLLECTION (A)  Final    Report Status 03/06/2017 FINAL  Final         Radiology Studies: Dg Esophagus  Result Date: 03/06/2017 CLINICAL DATA:  Chest pain. Abnormal esophageal thickening on CT chest EXAM: ESOPHOGRAM/BARIUM SWALLOW TECHNIQUE: Single contrast examination was performed using thin barium and barium tablet. FLUOROSCOPY TIME:  Fluoroscopy Time:  1 minutes 42 second Radiation Exposure Index (if provided by the fluoroscopic device): Number of Acquired Spot Images: 0 COMPARISON:  CT chest 03/04/2017 FINDINGS: Poor esophageal motility. There is esophageal spasm with uncoordinated peristalsis. Barium passes into the stomach without obstruction. No stricture or mass. Negative for hiatal hernia. No significant mucosal irregularity or ulceration. The patient swallowed a barium tablet which did not pass into the stomach. This is felt to be due to poor peristalsis and supine  positioning. IMPRESSION: Poor esophageal motility with esophageal spasm. No evidence of stricture or mass. No evidence of mucosal irregularity. Barium tablet progressed to the distal esophagus but not into the stomach, felt to be due to poor peristalsis. No stricture. Electronically Signed   By: Franchot Gallo M.D.   On: 03/06/2017 08:40   Nm Myocar Multi W/spect W/wall Motion / Ef  Result Date: 03/07/2017 CLINICAL DATA:  Chest pain EXAM: MYOCARDIAL IMAGING WITH SPECT (REST AND PHARMACOLOGIC-STRESS) GATED LEFT VENTRICULAR WALL MOTION STUDY LEFT VENTRICULAR EJECTION FRACTION TECHNIQUE: Standard myocardial SPECT imaging was performed after resting intravenous injection of 10 mCi Tc-49m tetrofosmin. Subsequently, intravenous infusion of Lexiscan was performed under the supervision of the Cardiology staff. At peak effect of the drug, 30 mCi Tc-53m tetrofosmin was injected intravenously and standard myocardial SPECT imaging was performed. Quantitative gated imaging was also performed to evaluate left ventricular wall motion, and estimate left ventricular  ejection fraction. COMPARISON:  None. FINDINGS: Perfusion: Large fixed defect noted in the inferolateral wall. No reversibility to suggest ischemia. Wall Motion: Decreased motion and thickening in the inferolateral wall. Left Ventricular Ejection Fraction: 52 % End diastolic volume 220 ml End systolic volume 58 ml IMPRESSION: 1. Large fixed defect in the inferolateral wall compatible with scar. No evidence of inducible ischemia. 2. Decreased wall motion and thickening in the area of scar. 3. Left ventricular ejection fraction 52% 4. Non invasive risk stratification*: High *2012 Appropriate Use Criteria for Coronary Revascularization Focused Update: J Am Coll Cardiol. 2542;70(6):237-628. http://content.airportbarriers.com.aspx?articleid=[email or web address] Electronically Signed   By: Rolm Baptise M.D.   On: 03/07/2017 14:13        Scheduled Meds: . amLODipine  10 mg Oral Daily  . aspirin  81 mg Oral Daily  . aspirin EC  81 mg Oral Once  . carvedilol  3.125 mg Oral BID WC  . cloNIDine  0.1 mg Oral BID  . gi cocktail  30 mL Oral Once  . heparin  5,000 Units Subcutaneous Q8H  . hydrALAZINE  10 mg Oral Q8H  . insulin aspart  0-9 Units Subcutaneous TID WC  . insulin glargine  3 Units Subcutaneous Daily  . isosorbide mononitrate  120 mg Oral Daily  . levothyroxine  50 mcg Oral QAC breakfast  .  morphine injection  4 mg Intravenous Once  . pantoprazole  40 mg Oral Q0600  . rosuvastatin  10 mg Oral q1800  . sucralfate  1 g Oral Q6H   Continuous Infusions: . nitroGLYCERIN Stopped (03/07/17 0931)     LOS: 1 day    Time spent: 85 mins    Izabella Marcantel, MD [facility] Pager [phone]  If 7PM-7AM, please contact night-coverage [email or web address] Password [email or web address] 03/07/2017, 4:16 PM

## 2017-03-07 NOTE — Telephone Encounter (Signed)
Called again.   I am certainly ok with IV iron if covered by hospice and medically necessary.   Will ask nursing to attempt again and inform.  Laroy Apple, MD [facility] 03/07/2017, 3:07 PM

## 2017-03-07 NOTE — Progress Notes (Signed)
Call received from [place] stating unable to perform  scheduled Lexiscan with Nitro gtt infusing.  Notified [facility], PA with verbal order received to stop Nitro gtt now & monitor pt for CP.  Pt is currently CP free.  Nitro gtt stopped @ approx 09:30am.  Pamala Hurry, Nuc Med RN, notified of this & pt ready for scan.

## 2017-03-07 NOTE — Progress Notes (Signed)
Inpatient Diabetes Program Recommendations  AACE/ADA: New Consensus Statement on Inpatient Glycemic Control (2015)  Target Ranges:  Prepandial:   less than 140 mg/dL      Peak postprandial:   less than 180 mg/dL (1-2 hours)      Critically ill patients:  140 - 180 mg/dL   Results for [name], [name] (MRN [id number]) as of 03/07/2017 11:02  Ref. Range 03/05/2017 09:39 03/06/2017 12:13 03/06/2017 16:34 03/06/2017 20:45 03/07/2017 07:51  Glucose-Capillary Latest Ref Range: 65 - 99 mg/dL 213 (H) 251 (H) 184 (H) 165 (H) 205 (H)   Review of Glycemic Control  Diabetes history: DM2 Outpatient Diabetes medications: NPH 10 units BID Current orders for Inpatient glycemic control: Novolog 0-9 units TID with meals  Inpatient Diabetes Program Recommendations: Insulin - Basal: Please consider ordering Lantus 6 units Q24H (based on 62 kg x 0.1 units). Correction (SSI): Please consider ordering Novolog bedtime correction scale.  Thanks, Barnie Alderman, RN, MSN, CDE Diabetes Coordinator Inpatient Diabetes Program [phone] (Team Pager from 8am to 5pm)

## 2017-03-07 NOTE — Progress Notes (Addendum)
Lexiscan myoview results reviewed and showed a large fixed defect in the inferolateral wall compatible with scar. No evidence of inducible ischemia. Decreased wall motion and thickening in the area of scar..Left ventricular ejection fraction 52%.  2D echo 09/2016 showed normal LVF with no inferior scar.  I will repeat a limited echo and if inferior wall has normal motion then no further cardiac workup and low risk scan.    Addendum: 2D echo Study Conclusions  - Left ventricle: The cavity size was normal. Wall thickness was   increased in a pattern of mild LVH. Systolic function was normal.   The estimated ejection fraction was in the range of 55% to 60%.   Wall motion was normal; there were no regional wall motion   abnormalities. Features are consistent with a pseudonormal left   ventricular filling pattern, with concomitant abnormal relaxation   and increased filling pressure (grade 2 diastolic dysfunction).   Doppler parameters are consistent with high ventricular filling   pressure. - Mitral valve: Calcified annulus. There was moderate   regurgitation. - Left atrium: The atrium was mildly dilated.  Impressions:  - Normal LV systolic function; grade 2 diastolic dysfunction;   elevated LV filling pressure; MAC with at least moderate MR; may   be severe; mild LAE.  The large fixed defect in inferolateral wall noted on lexiscan myoview is likely diaphragmatic attenuation and not scar given normal wall motion in inferior wall on echo.  No ischemia noted on nuclear stress test and normal LVF.  No further ischemic workup noted at this time especially in light of CKD stage 4 with creatinine 3.  Suspect that CP is related to diffuse reflux esophagitis noted on chest CT and poor esophageal motility and esophageal spasm as noted on esophageal films.  BP improved with addition of hydralazine.  Could consider stopping clonidine and increasing hydralazine to consolidate meds. No other recs at this  time.  Will sign off.  Call with any questions.  Will have patient followup with Dr. Percival Spanish regarding her MR.

## 2017-03-07 NOTE — Progress Notes (Signed)
  Echocardiogram 2D Echocardiogram has been performed.  Johny Chess 03/07/2017, 4:36 PM

## 2017-03-07 NOTE — Telephone Encounter (Signed)
Ok with epogen if approved, appreciate trenal's help.   Laroy Apple, MD [facility] 03/07/2017, 4:45 PM

## 2017-03-07 NOTE — Progress Notes (Signed)
Patient presented for Lexiscan. Tolerated procedure well except for some nausea and vomited X1 after completion of test. Pending final stress imaging result.  Daune Perch, NP

## 2017-03-07 NOTE — Progress Notes (Addendum)
Chest pain 3/10 admin morphine rapid relief of symtoms

## 2017-03-07 NOTE — Telephone Encounter (Signed)
Spoke with Thayer Headings at Dr. Florentina Addison office, she states [name] the PA was interested in giving patient Epogen injections if Hospice authorizes.  She states patient is currently admitted to the hospital, but they will check with Hospice before administering Epogen if patient agreeable to receiving.

## 2017-03-08 LAB — CBC
HCT: 19 % — ABNORMAL LOW (ref 36.0–46.0)
HCT: 19.9 % — ABNORMAL LOW (ref 36.0–46.0)
HEMOGLOBIN: 6.4 g/dL — AB (ref 12.0–15.0)
Hemoglobin: 6.2 g/dL — CL (ref 12.0–15.0)
MCH: 29.1 pg (ref 26.0–34.0)
MCH: 28.8 pg (ref 26.0–34.0)
MCHC: 32.6 g/dL (ref 30.0–36.0)
MCHC: 32.2 g/dL (ref 30.0–36.0)
MCV: 89.2 fL (ref 78.0–100.0)
MCV: 89.6 fL (ref 78.0–100.0)
PLATELETS: 158 10*3/uL (ref 150–400)
PLATELETS: 157 10*3/uL (ref 150–400)
RBC: 2.13 MIL/uL — ABNORMAL LOW (ref 3.87–5.11)
RBC: 2.22 MIL/uL — AB (ref 3.87–5.11)
RDW: 13.1 % (ref 11.5–15.5)
RDW: 13.3 % (ref 11.5–15.5)
WBC: 4.4 10*3/uL (ref 4.0–10.5)
WBC: 4.4 10*3/uL (ref 4.0–10.5)

## 2017-03-08 LAB — BASIC METABOLIC PANEL
Anion gap: 8 (ref 5–15)
BUN: 58 mg/dL — AB (ref 6–20)
CO2: 23 mmol/L (ref 22–32)
CREATININE: 2.79 mg/dL — AB (ref 0.44–1.00)
Calcium: 9.3 mg/dL (ref 8.9–10.3)
Chloride: 105 mmol/L (ref 101–111)
GFR calc Af Amer: 17 mL/min — ABNORMAL LOW (ref >60)
GFR, EST NON AFRICAN AMERICAN: 15 mL/min — AB (ref >60)
GLUCOSE: 167 mg/dL — AB (ref 65–99)
Potassium: 4.6 mmol/L (ref 3.5–5.1)
SODIUM: 136 mmol/L (ref 135–145)

## 2017-03-08 LAB — GLUCOSE, CAPILLARY
GLUCOSE-CAPILLARY: 190 mg/dL — AB (ref 65–99)
GLUCOSE-CAPILLARY: 167 mg/dL — AB (ref 65–99)
Glucose-Capillary: 157 mg/dL — ABNORMAL HIGH (ref 65–99)
Glucose-Capillary: 123 mg/dL — ABNORMAL HIGH (ref 65–99)

## 2017-03-08 LAB — PREPARE RBC (CROSSMATCH)

## 2017-03-08 MED ORDER — SODIUM CHLORIDE 0.9 % IV SOLN: Freq: Once | INTRAVENOUS | Status: DC

## 2017-03-08 MED ORDER — FUROSEMIDE 10 MG/ML IJ SOLN
20.0000 mg | Freq: Once | INTRAMUSCULAR | Status: AC
  Administered 2017-03-08: 20 mg via INTRAVENOUS
  Filled 2017-03-08: qty 2

## 2017-03-08 NOTE — Progress Notes (Signed)
Occupational Therapy Treatment Patient Details Name: [name] MRN: [id number] DOB: 01-23-1932 Today's Date: 03/08/2017    History of present illness 83 y.o. female admitted with chest pain and HTN with multiple 5-6 second pauses. Pt had NSTEMI 4 months ago. PMH significant for CKD, CVA (left side), dementia and DM.    OT comments  Limited OT session performed due to pt with low Hgb and receiving blood transfusion; RN OKed pt to participate in therapy. Pt able to perform sit to stand from chair with min assist and required min guard assist for short distance functional mobility. Pt returned to chair due to bleeding from IV site; RN notified. D/c plan remains appropriate. Will continue to follow acutely.   Follow Up Recommendations  Home health OT;Supervision/Assistance - 24 hour    Equipment Recommendations  None recommended by OT    Recommendations for Other Services      Precautions / Restrictions Precautions Precautions: Fall Restrictions Weight Bearing Restrictions: No       Mobility Bed Mobility               General bed mobility comments: Pt OOB in chair upon arrival.  Transfers Overall transfer level: Needs assistance Equipment used: Rolling walker (2 wheeled) Transfers: Sit to/from Stand Sit to Stand: Min assist         General transfer comment: Light min assist to boost up from chair. Increased time required. Cues for hand placement    Balance Overall balance assessment: Needs assistance Sitting-balance support: Feet supported;No upper extremity supported Sitting balance-Leahy Scale: Fair     Standing balance support: Bilateral upper extremity supported Standing balance-Leahy Scale: Poor Standing balance comment: RW for support                           ADL either performed or assessed with clinical judgement   ADL Overall ADL's : Needs assistance/impaired                                     Functional mobility  during ADLs: Min guard;Rolling walker General ADL Comments: Limited session due to pt with low Hgb and receiving blood-RN oked to work with pt. Pt required min assist for sit to stand from chair and min guard for short distance functional mobility. Retured pt to sitting due to bleeding from IV site (RN notified). No c/o dizziness, VSS.     Vision       Perception     Praxis      Cognition Arousal/Alertness: Awake/alert Behavior During Therapy: WFL for tasks assessed/performed Overall Cognitive Status: History of cognitive impairments - at baseline                                          Exercises     Shoulder Instructions       General Comments      Pertinent Vitals/ Pain       Pain Assessment: No/denies pain  Home Living                                          Prior Functioning/Environment  Frequency  Min 2X/week        Progress Toward Goals  OT Goals(current goals can now be found in the care plan section)  Progress towards OT goals: Not progressing toward goals - comment (limited by blood transfusion and bleeding from IV)  Acute Rehab OT Goals Patient Stated Goal: home OT Goal Formulation: With patient/family  Plan Discharge plan remains appropriate    Co-evaluation                 End of Session Equipment Utilized During Treatment: Rolling walker  OT Visit Diagnosis: Unsteadiness on feet (R26.81);Muscle weakness (generalized) (M62.81)   Activity Tolerance Patient tolerated treatment well   Patient Left in chair;with call bell/phone within reach;with chair alarm set;with family/visitor present   Nurse Communication Mobility status;Other (comment) (IV site bleeding)        Time: 1445-1500 OT Time Calculation (min): 15 min  Charges: OT General Charges $OT Visit: 1 Procedure OT Treatments $Therapeutic Activity: 8-22 mins  Ryder Man A. Ulice Brilliant, M.S., OTR/L Pager: [place] 03/08/2017, 3:04 PM

## 2017-03-08 NOTE — Progress Notes (Signed)
PT Cancellation Note  Patient Details Name: [name] MRN: [id number] DOB: [birth year]   Cancelled Treatment:    Reason Eval/Treat Not Completed: Other (comment) (Pt being treatment By OT will f/u later this pm.  )   Addison Whidbee Eli Hose 03/08/2017, 2:53 PM Governor Rooks, PTA pager [phone]

## 2017-03-08 NOTE — Progress Notes (Signed)
Physical Therapy Treatment Patient Details Name: [name] MRN: [id number] DOB: 09-21-1932 Today's Date: 03/08/2017    History of Present Illness 83 y.o. female admitted with chest pain and HTN with multiple 5-6 second pauses. Pt had NSTEMI 4 months ago. PMH significant for CKD, CVA (left side), dementia and DM.     PT Comments    Pt performed increased mobility during session.  Pt fatigues quickly but performed well.  Pt hgb 6.4 with 1 unit of blood transfused and RN to start the second after therapy.  Pt asymptomatic during session.     Follow Up Recommendations  No PT follow up;Supervision/Assistance - 24 hour     Equipment Recommendations  None recommended by PT    Recommendations for Other Services       Precautions / Restrictions Precautions Precautions: Fall Restrictions Weight Bearing Restrictions: No    Mobility  Bed Mobility Overal bed mobility: Needs Assistance Bed Mobility: Sit to Supine     Supine to sit: Supervision     General bed mobility comments: Pt required cueing for positioning in bed and to return to supine.    Transfers Overall transfer level: Needs assistance Equipment used: Rolling walker (2 wheeled) Transfers: Sit to/from Stand Sit to Stand: Min guard         General transfer comment: Light min assist to boost up from chair. Increased time required. Cues for hand placement  Ambulation/Gait Ambulation/Gait assistance: Min assist Ambulation Distance (Feet): 120 Feet Assistive device: Rolling walker (2 wheeled) Gait Pattern/deviations: Decreased stride length;Drifts right/left;Trunk flexed;Narrow base of support;Step-through pattern Gait velocity: slow   General Gait Details: minA for walker management, v/c's to stay close to walker   Stairs            Wheelchair Mobility    Modified Rankin (Stroke Patients Only)       Balance Overall balance assessment: Needs assistance Sitting-balance support: Feet supported;No  upper extremity supported Sitting balance-Leahy Scale: Good     Standing balance support: Bilateral upper extremity supported Standing balance-Leahy Scale: Poor Standing balance comment: RW for support                            Cognition Arousal/Alertness: Awake/alert Behavior During Therapy: WFL for tasks assessed/performed Overall Cognitive Status: History of cognitive impairments - at baseline                                 General Comments: pt with h/o dementia      Exercises      General Comments        Pertinent Vitals/Pain Pain Assessment: Faces Faces Pain Scale: Hurts little more Pain Location: back pain Pain Descriptors / Indicators: Discomfort;Grimacing;Guarding Pain Intervention(s): Monitored during session;Limited activity within patient's tolerance    Home Living                      Prior Function            PT Goals (current goals can now be found in the care plan section) Acute Rehab PT Goals Patient Stated Goal: home Potential to Achieve Goals: Good Progress towards PT goals: Progressing toward goals    Frequency    Min 2X/week      PT Plan Current plan remains appropriate    Co-evaluation  End of Session Equipment Utilized During Treatment: Gait belt Activity Tolerance: Patient tolerated treatment well Patient left: in chair;with call bell/phone within reach;with family/visitor present Nurse Communication: Mobility status (reports nausea) PT Visit Diagnosis: Unsteadiness on feet (R26.81);Difficulty in walking, not elsewhere classified (R26.2)     Time: 0037-0488 PT Time Calculation (min) (ACUTE ONLY): 25 min  Charges:  $Gait Training: 8-22 mins $Therapeutic Activity: 8-22 mins                    G Codes:       [name], PTA pager [phone]    [name] 03/08/2017, 4:30 PM

## 2017-03-08 NOTE — Progress Notes (Addendum)
PROGRESS NOTE    [name]  ZDG:[id number] DOB: Jun 07, 1932 DOA: 03/04/2017 PCP: Kenn File, MD   Brief Narrative: 83 year old female history of coronary artery disease, chronic kidney disease stage IV, hypertension, diabetes, atrial fibrillation, hyperlipidemia, history of CVA recent NSTEMI during hospitalization of 11/17/2016-11/29/2016 which was treated medically as patient had refused cardiac catheterization. Patient presenting with nausea vomiting midsternal chest pain radiating to bilateral upper extremities. Patient with typical and atypical symptoms of chest pain. Likely GI versus cardiac. Patient admitted for further workup. GI and cardiology consulted.  Assessment & Plan:  # Chest pain:  -Patient with extensive cardiac history with recent NSTEMI Jaquelyn Bitter medically and reportedly patient had refused cardiac catheterization. CT scan of chest with diffuse esophagitis. Evaluated by both cardiology and GI. -Cardiac enzymes are minimally elevated. 2-D echo with normal left ventricular systolic function, grade 2 diastolic dysfunction. Cardiac nuclear stress test with large fixed defect in the inferolateral wall which is likely diaphragmatic attenuation and not scar given normal wall motion and inferior wall and neck wears per cardiologist. No ischemia noted. No further ischemic workup at this time by cardiologist. Continue current medical management. Follow-up with Dr. Percival Spanish regarding MR. -Continue current medical management. -Continue PPI.  #Presumed reflux esophagitis: Evaluated by gastroenterologist. Holding off on EGD due to patient's potential risk of sedation. Status post esophagogram showing poor esophageal motility with esophageal spasms. Continue PPI, sucralfate. Encourage oral intake.  #Acute on chronic anemia: [employer] store is acceptable. Likely in the setting of chronic kidney disease. Repeat CBC today still with low hemoglobin. The patient has no bowel movement today. No  sign of active bleeding. Plan to transfuse 2 units of PRBC. Repeat CBC in the morning. Ordered stool occult blood test  #Chronic kidney disease is stage IV: Monitor creatinine and electrolytes. I'll avoid nephrotoxins.  #Hypertension: Monitor blood pressure closely. Continue Norvasc, Imdur, hydralazine.  #Paroxysmal atrial fibrillation: Currently on Coreg, aspirin. The clonidine was decreased by cardiologist because of sinus pauses/bradycardia.  #Diabetes mellitus: Continue sliding scale. Monitor blood sugar level.  #History of coronary artery disease, chronic diastolic congestive heart failure:  Principal Problem:   Chest pain Active Problems:   Hyperlipidemia   DIASTOLIC HEART FAILURE, CHRONIC   Cerebrovascular disease   Uncontrolled hypertension   OA (osteoarthritis)   NSVT (nonsustained ventricular tachycardia) (HCC)   HTN (hypertension)   Benign paroxysmal positional vertigo   CAD S/P PCI '03 and '08   Anemia in chronic kidney disease   Insulin dependent diabetes mellitus with complications (HCC)   Chronic kidney disease, stage IV (severe) (HCC)   PAF (paroxysmal atrial fibrillation) (HCC)   Esophagitis   Sinus node dysfunction (HCC)   Abnormal CT scan of lung  DVT prophylaxis:heparin sq Code Status:dnr Family Communication: No family present at bedside Disposition Plan: Likely discharge home with home care in 1-2 days.  PT OT evaluation.    Consultants:   Gastroenterology: Dr.Nandigam 03/05/2017  Cardiology : Dr. Lovena Le 03/05/2017  Procedures:  CT chest/CT abdomen and pelvis 03/04/2017  Chest x-ray 03/04/2017  Esophagram 03/06/2017  Myoview stress test 03/07/2017  2-D echo 03/07/2017 Antimicrobials: none Subjective: Patient was seen and examined at bedside. Denied headache, dizziness, nausea, vomiting. Reported intermittent chest pain for last 3 months. Denied shortness of breath.  Objective: Vitals:   03/08/17 0506 03/08/17 0605 03/08/17 0615  03/08/17 0815  BP: (!) 135/44 (!) 109/43 (!) 109/43 (!) 175/66  Pulse:   61 84  Resp:      Temp:   98.3 F (  36.8 C)   TempSrc:   Oral   SpO2:   96%   Weight:      Height:        Intake/Output Summary (Last 24 hours) at 03/08/17 1137 Last data filed at 03/07/17 2111  Gross per 24 hour  Intake              600 ml  Output              500 ml  Net              100 ml   Filed Weights   03/05/17 0447 03/07/17 0655 03/08/17 0428  Weight: 62.5 kg (137 lb 12.6 oz) 63.8 kg (140 lb 11.2 oz) 61.4 kg (135 lb 6.4 oz)    Examination:  General exam: Appears calm and comfortable  Respiratory system: Clear to auscultation. Respiratory effort normal. No wheezing or crackle Cardiovascular system: S1 & S2 heard, RRR.  No pedal edema. Gastrointestinal system: Abdomen is nondistended, soft and nontender. Normal bowel sounds heard. Central nervous system: Alert Awake and following commands Extremities: Symmetric 5 x 5 power. Skin: No rashes, lesions or ulcers     Data Reviewed: I have personally reviewed following labs and imaging studies  CBC:  Recent Labs Lab 03/04/17 1206 03/05/17 0406 03/06/17 0649 03/07/17 0652 03/08/17 0343 03/08/17 1044  WBC 5.6 5.2 7.8 6.6 4.4 4.4  NEUTROABS 4.2  --   --   --   --   --   HGB 8.6* 7.6* 8.1* 7.9* 6.2* 6.4*  HCT 25.9* 23.4* 24.3* 24.6* 19.0* 19.9*  MCV 87.8 89.0 88.7 89.8 89.2 89.6  PLT 194 208 174 198 158 992   Basic Metabolic Panel:  Recent Labs Lab 03/04/17 1206 03/04/17 1817 03/05/17 0406 03/06/17 0407 03/07/17 0652 03/08/17 0343  NA 138  --  141 137 138 136  K 4.5  --  5.0 5.2* 4.6 4.6  CL 103  --  105 104 107 105  CO2 21*  --  23 18* 22 23  GLUCOSE 205*  --  201* 260* 213* 167*  BUN 68*  --  67* 66* 56* 58*  CREATININE 3.05*  --  3.03* 3.05* 2.78* 2.79*  CALCIUM 9.9  --  9.8 9.5 10.0 9.3  MG  --  2.3  --   --   --   --    GFR: Estimated Creatinine Clearance: 13.4 mL/min (A) (by C-G formula based on SCr of 2.79 mg/dL  (H)). Liver Function Tests:  Recent Labs Lab 03/04/17 1206  AST 12*  ALT 9*  ALKPHOS 67  BILITOT 0.5  PROT 6.4*  ALBUMIN 3.4*    Recent Labs Lab 03/04/17 1206  LIPASE 19   No results for input(s): AMMONIA in the last 168 hours. Coagulation Profile:  Recent Labs Lab 03/04/17 1206  INR 1.00   Cardiac Enzymes:  Recent Labs Lab 03/04/17 1817 03/04/17 2204 03/05/17 0406  TROPONINI <0.03 0.03* 0.05*   BNP (last 3 results) No results for input(s): PROBNP in the last 8760 hours. HbA1C:  Recent Labs  03/06/17 1215  HGBA1C 6.6*   CBG:  Recent Labs Lab 03/06/17 2045 03/07/17 0751 03/07/17 1306 03/07/17 1634 03/08/17 0803  GLUCAP 165* 205* 150* 212* 190*   Lipid Profile: No results for input(s): CHOL, HDL, LDLCALC, TRIG, CHOLHDL, LDLDIRECT in the last 72 hours. Thyroid Function Tests: No results for input(s): TSH, T4TOTAL, FREET4, T3FREE, THYROIDAB in the last 72 hours. Anemia Panel: No results for  input(s): VITAMINB12, FOLATE, FERRITIN, TIBC, IRON, RETICCTPCT in the last 72 hours. Sepsis Labs: No results for input(s): PROCALCITON, LATICACIDVEN in the last 168 hours.  Recent Results (from the past 240 hour(s))  Urine culture     Status: Abnormal   Collection Time: 03/05/17  8:22 AM  Result Value Ref Range Status   Specimen Description URINE, CLEAN CATCH  Final   Special Requests NONE  Final   Culture MULTIPLE SPECIES PRESENT, SUGGEST RECOLLECTION (A)  Final   Report Status 03/06/2017 FINAL  Final         Radiology Studies: Nm Myocar Multi W/spect W/wall Motion / Ef  Result Date: 03/07/2017 CLINICAL DATA:  Chest pain EXAM: MYOCARDIAL IMAGING WITH SPECT (REST AND PHARMACOLOGIC-STRESS) GATED LEFT VENTRICULAR WALL MOTION STUDY LEFT VENTRICULAR EJECTION FRACTION TECHNIQUE: Standard myocardial SPECT imaging was performed after resting intravenous injection of 10 mCi Tc-4m tetrofosmin. Subsequently, intravenous infusion of Lexiscan was performed under  the supervision of the Cardiology staff. At peak effect of the drug, 30 mCi Tc-71m tetrofosmin was injected intravenously and standard myocardial SPECT imaging was performed. Quantitative gated imaging was also performed to evaluate left ventricular wall motion, and estimate left ventricular ejection fraction. COMPARISON:  None. FINDINGS: Perfusion: Large fixed defect noted in the inferolateral wall. No reversibility to suggest ischemia. Wall Motion: Decreased motion and thickening in the inferolateral wall. Left Ventricular Ejection Fraction: 52 % End diastolic volume 025 ml End systolic volume 58 ml IMPRESSION: 1. Large fixed defect in the inferolateral wall compatible with scar. No evidence of inducible ischemia. 2. Decreased wall motion and thickening in the area of scar. 3. Left ventricular ejection fraction 52% 4. Non invasive risk stratification*: High *2012 Appropriate Use Criteria for Coronary Revascularization Focused Update: J Am Coll Cardiol. 8527;78(2):423-536. http://content.[email or web address].aspx?articleid=[email or web address] Electronically Signed   By: Rolm Baptise M.D.   On: 03/07/2017 14:13        Scheduled Meds: . sodium chloride   Intravenous Once  . amLODipine  10 mg Oral Daily  . aspirin  81 mg Oral Daily  . aspirin EC  81 mg Oral Once  . carvedilol  3.125 mg Oral BID WC  . cloNIDine  0.1 mg Oral BID  . gi cocktail  30 mL Oral Once  . heparin  5,000 Units Subcutaneous Q8H  . hydrALAZINE  10 mg Oral Q8H  . insulin aspart  0-9 Units Subcutaneous TID WC  . insulin glargine  3 Units Subcutaneous Daily  . isosorbide mononitrate  120 mg Oral Daily  . levothyroxine  50 mcg Oral QAC breakfast  .  morphine injection  4 mg Intravenous Once  . pantoprazole  40 mg Oral Q0600  . rosuvastatin  10 mg Oral q1800  . sucralfate  1 g Oral Q6H   Continuous Infusions:   LOS: 2 days    Dron Tanna Furry, MD [facility] Pager [phone]  If 7PM-7AM, please contact  night-coverage [email or web address] Password [facility] 03/08/2017, 11:37 AM

## 2017-03-09 ENCOUNTER — Ambulatory Visit: Payer: Medicare Other | Admitting: Family Medicine

## 2017-03-09 LAB — CBC
HEMATOCRIT: 31.5 % — AB (ref 36.0–46.0)
Hemoglobin: 10.2 g/dL — ABNORMAL LOW (ref 12.0–15.0)
MCH: 28.1 pg (ref 26.0–34.0)
MCHC: 32.4 g/dL (ref 30.0–36.0)
MCV: 86.8 fL (ref 78.0–100.0)
Platelets: 167 10*3/uL (ref 150–400)
RBC: 3.63 MIL/uL — ABNORMAL LOW (ref 3.87–5.11)
RDW: 14.7 % (ref 11.5–15.5)
WBC: 4.7 10*3/uL (ref 4.0–10.5)

## 2017-03-09 LAB — BPAM RBC
BLOOD PRODUCT EXPIRATION DATE: 201804252359
Blood Product Expiration Date: 201804252359
ISSUE DATE / TIME: 201804111305
ISSUE DATE / TIME: 201804111654
UNIT TYPE AND RH: 6200
Unit Type and Rh: 6200

## 2017-03-09 LAB — TYPE AND SCREEN
ABO/RH(D): A POS
ANTIBODY SCREEN: POSITIVE
DAT, IgG: NEGATIVE
UNIT DIVISION: 0
Unit division: 0

## 2017-03-09 LAB — GLUCOSE, CAPILLARY: Glucose-Capillary: 151 mg/dL — ABNORMAL HIGH (ref 65–99)

## 2017-03-09 MED ORDER — PANTOPRAZOLE SODIUM 40 MG PO TBEC: 40.0000 mg | DELAYED_RELEASE_TABLET | Freq: Every day | ORAL | 0 refills | Status: DC

## 2017-03-09 MED ORDER — HYDRALAZINE HCL 10 MG PO TABS: 10.0000 mg | ORAL_TABLET | Freq: Three times a day (TID) | ORAL | 0 refills | Status: DC

## 2017-03-09 MED ORDER — CLONIDINE HCL 0.1 MG PO TABS: 0.1000 mg | ORAL_TABLET | Freq: Two times a day (BID) | ORAL | 0 refills | Status: DC

## 2017-03-09 MED ORDER — SUCRALFATE 1 GM/10ML PO SUSP: 1.0000 g | Freq: Four times a day (QID) | ORAL | 0 refills | Status: DC

## 2017-03-09 MED ORDER — CARVEDILOL 6.25 MG PO TABS: 6.2500 mg | ORAL_TABLET | Freq: Two times a day (BID) | ORAL | 0 refills | Status: DC

## 2017-03-09 NOTE — Discharge Summary (Addendum)
Physician Discharge Summary  TIMA CURET VEL:[id number] DOB: [birth year] DOA: 03/04/2017  PCP: Kenn File, MD  Admit date: 03/04/2017 Discharge date: 03/09/2017  Admitted From: Home Disposition:home with home care services  Recommendations for Outpatient Follow-up:  1. Follow up with PCP in 1-2 weeks 2. Please obtain BMP/CBC in one week  Home Health:yes Equipment/Devices:no Discharge Condition:stable CODE STATUS:DNR Diet recommendation:soft, carb modified heart healthy diet.  Brief/Interim Summary: 83 year old female history of coronary artery disease, chronic kidney disease stage IV, hypertension, diabetes, atrial fibrillation, hyperlipidemia, history of CVA recent NSTEMI during hospitalization of 11/17/2016-11/29/2016 which was treated medically as patient had refused cardiac catheterization. Patient presenting with nausea vomiting midsternal chest pain radiating to bilateral upper extremities. Patient with typical and atypical symptoms of chest pain. Likely GI versus cardiac. Patient admitted for further workup. GI and cardiology consulted.  # Chest pain:  -Patient with extensive cardiac history with recent NSTEMI when pt was managed medically and reportedly patient had refused cardiac catheterization. CT scan of chest with diffuse esophagitis. Evaluated by both cardiology and GI. -Cardiac enzymes minimally elevated. 2-D echo with normal left ventricular systolic function, grade 2 diastolic dysfunction. Cardiac nuclear stress test with large fixed defect in the inferolateral wall which is likely diaphragmatic attenuation and not scar given normal wall motion and inferior wall and neck wears per cardiologist. No ischemia noted. No further ischemic workup at this time by cardiologist. Continue current medical management. Follow-up with Dr. Percival Spanish regarding MR. Patient is clinically improved. Denied headache, nausea, chest pain, shortness of breath. Recommended to follow up with  cardiologist and GI as an outpatient. Continue Carafate and PPI.  #Presumed reflux esophagitis: Evaluated by gastroenterologist. Holding off on EGD due to patient's potential risk of sedation. Status post esophagogram showing poor esophageal motility with esophageal spasms. Continue PPI, sucralfate. Encourage oral intake. Recommended to follow-up with GI as an outpatient.  #Acute on chronic anemia: Iron store is acceptable. Likely in the setting of chronic kidney disease. Received 2 units of red blood cell transfusion with a stable hemoglobin. No sign of active bleeding. I recommended patient to follow-up with GI for further evaluation. Also need to follow up with nephrologist regarding possible need of ESA.  #Chronic kidney disease is stage IV: Serum creatinine level is stable. Recommended to follow-up with PCP and nephrologist.  #Hypertension: Monitor blood pressure closely. Continue Norvasc, Imdur, hydralazine.  #Paroxysmal atrial fibrillation: Currently on Coreg, aspirin. The clonidine was decreased by cardiologist because of sinus pauses/bradycardia. Continue to monitor.  #Diabetes mellitus: Continue sliding scale. Monitor blood sugar level.  #History of coronary artery disease, chronic diastolic congestive heart failure:  Patient is clinically stable. Hemoglobin stable. At this time patient is medically stable to transfer her care to outpatient. Patient was evaluated by PT OT in the hospital. She will be discharged home with home care services including visiting nurse, PT OT. Pt said she lives with her daughter.  Discharge Diagnoses:  Principal Problem:   Chest pain Active Problems:   Hyperlipidemia   DIASTOLIC HEART FAILURE, CHRONIC   Cerebrovascular disease   Uncontrolled hypertension   OA (osteoarthritis)   NSVT (nonsustained ventricular tachycardia) (HCC)   Overweight   HTN (hypertension)   Benign paroxysmal positional vertigo   CAD S/P PCI '03 and '08   Anemia in  chronic kidney disease   Insulin dependent diabetes mellitus with complications (HCC)   Chronic kidney disease, stage IV (severe) (HCC)   PAF (paroxysmal atrial fibrillation) (HCC)   Esophagitis   Sinus node dysfunction (  [facility])      Discharge Instructions  Discharge Instructions    (HEART FAILURE PATIENTS) Call MD:  Anytime you have any of the following symptoms: 1) 3 pound weight gain in 24 hours or 5 pounds in 1 week 2) shortness of breath, with or without a dry hacking cough 3) swelling in the hands, feet or stomach 4) if you have to sleep on extra pillows at night in order to breathe.    Complete by:  As directed    Call MD for:  difficulty breathing, headache or visual disturbances    Complete by:  As directed    Call MD for:  extreme fatigue    Complete by:  As directed    Call MD for:  hives    Complete by:  As directed    Call MD for:  persistant dizziness or light-headedness    Complete by:  As directed    Call MD for:  persistant nausea and vomiting    Complete by:  As directed    Call MD for:  severe uncontrolled pain    Complete by:  As directed    Call MD for:  temperature >100.4    Complete by:  As directed    Diet - low sodium heart healthy    Complete by:  As directed    Diet Carb Modified    Complete by:  As directed    Increase activity slowly    Complete by:  As directed      Allergies as of 03/09/2017      Reactions   Propoxyphene N-acetaminophen    Pt does not know reaction   Simvastatin Other (See Comments)   headache      Medication List    TAKE these medications   ACCU-CHEK AVIVA PLUS test strip Generic drug:  glucose blood CHECK BLOOD SUGER UP TO 3 TIMES A DAY   amLODipine 10 MG tablet Commonly known as:  NORVASC TAKE 1 TABLET DAILY   aspirin 81 MG EC tablet Take 1 tablet (81 mg total) by mouth daily.   carvedilol 6.25 MG tablet Commonly known as:  COREG Take 1 tablet (6.25 mg total) by mouth 2 (two) times daily.   cholecalciferol  1000 units tablet Commonly known as:  VITAMIN D Take 1,000 Units by mouth daily.   cloNIDine 0.1 MG tablet Commonly known as:  CATAPRES Take 1 tablet (0.1 mg total) by mouth 2 (two) times daily. What changed:  medication strength  how much to take  when to take this   diclofenac sodium 1 % Gel Commonly known as:  VOLTAREN Apply 4 g topically 4 (four) times daily.   hydrALAZINE 10 MG tablet Commonly known as:  APRESOLINE Take 1 tablet (10 mg total) by mouth every 8 (eight) hours.   HYDROcodone-acetaminophen 5-325 MG tablet Commonly known as:  NORCO Take 1 tablet by mouth every 6 (six) hours as needed for moderate pain.   insulin NPH Human 100 UNIT/ML injection Commonly known as:  HUMULIN N Inject 0.1 mLs (10 Units total) into the skin 2 (two) times daily before a meal.   Insulin Syringe-Needle U-100 27G X 1/2" 1 ML Misc Use as needed   isosorbide mononitrate 120 MG 24 hr tablet Commonly known as:  IMDUR TAKE 1 TABLET DAILY   levothyroxine 50 MCG tablet Commonly known as:  SYNTHROID, LEVOTHROID TAKE 1 TABLET DAILY   nitroGLYCERIN 0.4 MG SL tablet Commonly known as:  NITROSTAT PLACE 1 TABLET UNDER THE  TONGUE AT ONSET OF CHEST PAIN EVERY 5 MINTUES UP TO 3 TIMES AS NEEDED   pantoprazole 40 MG tablet Commonly known as:  PROTONIX Take 1 tablet (40 mg total) by mouth daily.   polyethylene glycol powder powder Commonly known as:  GLYCOLAX/MIRALAX MIX 1 CAPFUL (17 GRAMS) WITH 8OZ OF WATER OR JUICE DAILY.   rosuvastatin 10 MG tablet Commonly known as:  CRESTOR Take 1 tablet (10 mg total) by mouth daily.   SENEXON-S 8.6-50 MG tablet Generic drug:  senna-docusate TAKE 4 TABLETS BY MOUTH 3 TIMES A DAY UNTIL BOWEL MOVEMENT.   sucralfate 1 GM/10ML suspension Commonly known as:  CARAFATE Take 10 mLs (1 g total) by mouth every 6 (six) hours.   torsemide 20 MG tablet Commonly known as:  DEMADEX Take 1 tablet (20 mg total) by mouth daily. What changed:  when to take  this      Follow-up Information    Kenn File, MD. Schedule an appointment as soon as possible for a visit in 1 week(s).   Specialty:  Family Medicine Contact information: [place] [address] [phone]        Minus Breeding, MD. Schedule an appointment as soon as possible for a visit in 2 week(s).   Specialty:  Cardiology Contact information: [address] [place] [address] [phone]        Harl Bowie, MD. Schedule an appointment as soon as possible for a visit in 2 week(s).   Specialty:  Gastroenterology Contact information: [place] [address] [place] Follow up.   Specialty:  Home Health Services Why:  Occupational Therapy, Registered Nurse and Physical Therapy Contact information: [place] [address] [phone]          Allergies  Allergen Reactions  . Propoxyphene N-Acetaminophen     Pt does not know reaction  . Simvastatin Other (See Comments)    headache    Consultations:  Gastroenterology: Dr.Nandigam 03/05/2017  Cardiology : Dr. Lovena Le 03/05/2017  Procedures/Studies:  CT chest/CT abdomen and pelvis 03/04/2017  Chest x-ray 03/04/2017  Esophagram 03/06/2017  Myoview stress test 03/07/2017  2-D echo 03/07/2017  Subjective: Patient was seen and examined at bedside. She was allowed awake. Denied headache, dizziness, nausea, vomiting, chest pain or shortness of breath. I discussed regarding discharge plan and she verbalized understanding.  Discharge Exam: Vitals:   03/08/17 2121 03/09/17 0315  BP:  (!) 152/53  Pulse: 71 68  Resp:  19  Temp: 98.6 F (37 C) 98.2 F (36.8 C)   Vitals:   03/08/17 1942 03/08/17 2040 03/08/17 2121 03/09/17 0315  BP: (!) 142/57 (!) 143/56  (!) 152/53  Pulse: 62  71 68  Resp: 18   19  Temp: 98.2 F (36.8 C)  98.6 F (37 C) 98.2 F (36.8 C)  TempSrc: Oral  Oral Oral   SpO2: 96%  97% 97%  Weight:    60.7 kg (133 lb 13.1 oz)  Height:        General: Pt is alert, awake, not in acute distress Cardiovascular: RRR, S1/S2 +, no rubs, no gallops Respiratory: CTA bilaterally, no wheezing, no rhonchi Abdominal: Soft, NT, ND, bowel sounds + Extremities: no edema, no cyanosis    The results of significant diagnostics from this hospitalization (including imaging, microbiology, ancillary and laboratory) are listed below for reference.     Microbiology: Recent Results (from the  past 240 hour(s))  Urine culture     Status: Abnormal   Collection Time: 03/05/17  8:22 AM  Result Value Ref Range Status   Specimen Description URINE, CLEAN CATCH  Final   Special Requests NONE  Final   Culture MULTIPLE SPECIES PRESENT, SUGGEST RECOLLECTION (A)  Final   Report Status 03/06/2017 FINAL  Final     Labs: BNP (last 3 results)  Recent Labs  10/05/16 1052 11/17/16 0900  BNP 411.8* 616.0*   Basic Metabolic Panel:  Recent Labs Lab 03/04/17 1206 03/04/17 1817 03/05/17 0406 03/06/17 0407 03/07/17 0652 03/08/17 0343  NA 138  --  141 137 138 136  K 4.5  --  5.0 5.2* 4.6 4.6  CL 103  --  105 104 107 105  CO2 21*  --  23 18* 22 23  GLUCOSE 205*  --  201* 260* 213* 167*  BUN 68*  --  67* 66* 56* 58*  CREATININE 3.05*  --  3.03* 3.05* 2.78* 2.79*  CALCIUM 9.9  --  9.8 9.5 10.0 9.3  MG  --  2.3  --   --   --   --    Liver Function Tests:  Recent Labs Lab 03/04/17 1206  AST 12*  ALT 9*  ALKPHOS 67  BILITOT 0.5  PROT 6.4*  ALBUMIN 3.4*    Recent Labs Lab 03/04/17 1206  LIPASE 19   No results for input(s): AMMONIA in the last 168 hours. CBC:  Recent Labs Lab 03/04/17 1206  03/06/17 0649 03/07/17 0652 03/08/17 0343 03/08/17 1044 03/09/17 0505  WBC 5.6  < > 7.8 6.6 4.4 4.4 4.7  NEUTROABS 4.2  --   --   --   --   --   --   HGB 8.6*  < > 8.1* 7.9* 6.2* 6.4* 10.2*  HCT 25.9*  < > 24.3* 24.6* 19.0* 19.9* 31.5*  MCV 87.8  < > 88.7 89.8 89.2  89.6 86.8  PLT 194  < > 174 198 158 157 167  < > = values in this interval not displayed. Cardiac Enzymes:  Recent Labs Lab 03/04/17 1817 03/04/17 2204 03/05/17 0406  TROPONINI <0.03 0.03* 0.05*   BNP: Invalid input(s): POCBNP CBG:  Recent Labs Lab 03/08/17 0803 03/08/17 1147 03/08/17 1638 03/08/17 2121 03/09/17 0745  GLUCAP 190* 167* 157* 123* 151*   D-Dimer No results for input(s): DDIMER in the last 72 hours. Hgb A1c No results for input(s): HGBA1C in the last 72 hours. Lipid Profile No results for input(s): CHOL, HDL, LDLCALC, TRIG, CHOLHDL, LDLDIRECT in the last 72 hours. Thyroid function studies No results for input(s): TSH, T4TOTAL, T3FREE, THYROIDAB in the last 72 hours.  Invalid input(s): FREET3 Anemia work up No results for input(s): VITAMINB12, FOLATE, FERRITIN, TIBC, IRON, RETICCTPCT in the last 72 hours. Urinalysis    Component Value Date/Time   COLORURINE YELLOW 03/05/2017 0822   APPEARANCEUR CLOUDY (A) 03/05/2017 0822   APPEARANCEUR Cloudy (A) 01/09/2017 1536   LABSPEC 1.014 03/05/2017 0822   PHURINE 7.0 03/05/2017 0822   GLUCOSEU 150 (A) 03/05/2017 0822   HGBUR LARGE (A) 03/05/2017 0822   BILIRUBINUR NEGATIVE 03/05/2017 0822   BILIRUBINUR Negative 01/09/2017 1536   KETONESUR 5 (A) 03/05/2017 0822   PROTEINUR 100 (A) 03/05/2017 0822   UROBILINOGEN negative 07/02/2015 1505   UROBILINOGEN 0.2 05/10/2015 1245   NITRITE NEGATIVE 03/05/2017 0822   LEUKOCYTESUR TRACE (A) 03/05/2017 0822   LEUKOCYTESUR 3+ (A) 01/09/2017 1536   Sepsis Labs Invalid input(s):  PROCALCITONIN,  WBC,  LACTICIDVEN Microbiology Recent Results (from the past 240 hour(s))  Urine culture     Status: Abnormal   Collection Time: 03/05/17  8:22 AM  Result Value Ref Range Status   Specimen Description URINE, CLEAN CATCH  Final   Special Requests NONE  Final   Culture MULTIPLE SPECIES PRESENT, SUGGEST RECOLLECTION (A)  Final   Report Status 03/06/2017 FINAL  Final      Time coordinating discharge: 31 minutes  SIGNED:   Rosita Fire, MD  [facility] 03/09/2017, 12:19 PM  If 7PM-7AM, please contact night-coverage [email or web address] Password [email or web address]

## 2017-03-09 NOTE — Care Management Note (Signed)
Case Management Note  Patient Details  Name: [name] MRN: [id number] Date of Birth: 1932-05-19  Subjective/Objective: Pt presented for Chest Pain. Pt is from home with the daughter that provides supervision. Daughter works about 2 hours per day. Pt was previously active with [facility] revoked on 03-08-17 by patient.                    Action/Plan: Pt in need of [place] [facility]. CM did offer list for agency choice and pt chose [facility]. Daughter at bedside and is agreeable. Daughter has questions in regards with personal care services. Referral was made to [facility] with [name] and [name] will explore the need for PCS. No DME needs at this time. SOC to begin within 24-48 hours. No further needs from CM.   Expected Discharge Date:  03/09/17               Expected Discharge Plan:  [place]  In-House Referral:  Clinical Social Work  Discharge planning Services  CM Consult  Post Acute Care Choice:    Choice offered to:  Patient, Adult Children  DME Arranged:  N/A DME Agency:  NA  HH Arranged:  RN, Disease Management, OT, Nurse's Aide [place] Agency:  [place]  Status of Service:  Completed, signed off  If discussed at [place] of Stay Meetings, dates discussed:    Additional Comments:  Bethena Roys, RN 03/09/2017, 11:40 AM

## 2017-03-09 NOTE — Consult Note (Signed)
   [facility] CM Inpatient Consult   03/09/2017  Shaneal Barasch [place] [birth year] [id number]    Made aware by inpatient RNCM that patient will be going home but not with hospice services after all.   Telephone call made to speak with Ms. [place] to offer [place] Management program services again, as she was active in the past. She yelled in the phone stating " I don't want them, I don't want no calls or nothing". Thereby, declining [facility] Care Management program.   Sent notification to inpatient RNCM to make aware that Ms. Lysaght refuses [facility] Care Management follow up.   Marthenia Rolling, MSN-Ed, RN,BSN [facility] Liaison [phone]

## 2017-03-14 ENCOUNTER — Telehealth: Payer: Self-pay | Admitting: Family Medicine

## 2017-03-14 NOTE — Telephone Encounter (Signed)
Pt notified of appt 

## 2017-03-16 ENCOUNTER — Ambulatory Visit (INDEPENDENT_AMBULATORY_CARE_PROVIDER_SITE_OTHER): Admitting: Family Medicine

## 2017-03-16 ENCOUNTER — Encounter: Payer: Self-pay | Admitting: Family Medicine

## 2017-03-16 VITALS — BP 157/63 | HR 69 | Temp 97.1°F | Ht 64.0 in | Wt 140.4 lb

## 2017-03-16 DIAGNOSIS — N184 Chronic kidney disease, stage 4 (severe): Secondary | ICD-10-CM | POA: Diagnosis not present

## 2017-03-16 DIAGNOSIS — I10 Essential (primary) hypertension: Secondary | ICD-10-CM

## 2017-03-16 DIAGNOSIS — K209 Esophagitis, unspecified without bleeding: Secondary | ICD-10-CM

## 2017-03-16 DIAGNOSIS — E875 Hyperkalemia: Secondary | ICD-10-CM

## 2017-03-16 MED ORDER — SUCRALFATE 1 G PO TABS: 1.0000 g | ORAL_TABLET | Freq: Three times a day (TID) | ORAL | 2 refills | Status: DC

## 2017-03-16 NOTE — Patient Instructions (Addendum)
Great to see you!  Come back in 3 months unless you need [place] sooner.   Be sur eto keep cardiology and nephrology follow up.

## 2017-03-16 NOTE — Progress Notes (Signed)
   HPI  Patient presents today for hospital follow up.   She was admitted for chest pain found to be due to esophagitis. Sh ehas know CAD and and had a slight troponin bump. Myoview showed no reversible ischemia.   She does not want to return to hospice care yyet.  She is willing to try epogen.   Carafate is helping esophagitis pain but is very expensive. Wants to try alternative.    PMH: Smoking status noted ROS: Per HPI  Objective: BP (!) 157/63   Pulse 69   Temp 97.1 F (36.2 C) (Oral)   Ht '5\' 4"'$  (1.626 m)   Wt 140 lb 6.4 oz (63.7 kg)   BMI 24.10 kg/m  Gen: NAD, alert, cooperative with exam HEENT: NCAT CV: RRR, good W0/J8, loud systolic murmur Resp: CTABL, no wheezes, non-labored Ext: No edema, warm Neuro: Alert and oriented, No gross deficits  Assessment and plan:  # esophagitis Pain improving Has GI follow up Carafate is helpful but not affordable, will try tablets.   # HTN Elevated but improved compared to recent checks. Repeat is 147/62 No changes  # CKD 4 Stable, repeat labs Agree with nephro she needs epogen which they intend to start. she has follow up with Nephro     Orders Placed This Encounter  Procedures  . CMP14+EGFR  . CBC with Differential/Platelet    Meds ordered this encounter  Medications  . sucralfate (CARAFATE) 1 g tablet    Sig: Take 1 tablet (1 g total) by mouth 4 (four) times daily -  with meals and at bedtime.    Dispense:  40 tablet    Refill:  [place], MD [facility] 03/16/2017, 5:50 PM

## 2017-03-17 ENCOUNTER — Other Ambulatory Visit: Payer: Self-pay | Admitting: Family Medicine

## 2017-03-17 LAB — CMP14+EGFR
A/G RATIO: 1.6 (ref 1.2–2.2)
ALT: 13 IU/L (ref 0–32)
AST: 10 IU/L (ref 0–40)
Albumin: 4.1 g/dL (ref 3.5–4.7)
Alkaline Phosphatase: 72 IU/L (ref 39–117)
BUN/Creatinine Ratio: 17 (ref 12–28)
BUN: 48 mg/dL — AB (ref 8–27)
Bilirubin Total: 0.4 mg/dL (ref 0.0–1.2)
CALCIUM: 10.2 mg/dL (ref 8.7–10.3)
CO2: 25 mmol/L (ref 18–29)
CREATININE: 2.81 mg/dL — AB (ref 0.57–1.00)
Chloride: 99 mmol/L (ref 96–106)
GFR calc non Af Amer: 15 mL/min/{1.73_m2} — ABNORMAL LOW (ref >59)
GFR, EST AFRICAN AMERICAN: 17 mL/min/{1.73_m2} — AB (ref >59)
GLOBULIN, TOTAL: 2.6 g/dL (ref 1.5–4.5)
Glucose: 202 mg/dL — ABNORMAL HIGH (ref 65–99)
Potassium: 5.8 mmol/L — ABNORMAL HIGH (ref 3.5–5.2)
Sodium: 138 mmol/L (ref 134–144)
TOTAL PROTEIN: 6.7 g/dL (ref 6.0–8.5)

## 2017-03-17 LAB — CBC WITH DIFFERENTIAL/PLATELET
BASOS: 0 %
Basophils Absolute: 0 10*3/uL (ref 0.0–0.2)
EOS (ABSOLUTE): 0.2 10*3/uL (ref 0.0–0.4)
EOS: 3 %
HEMOGLOBIN: 10.9 g/dL — AB (ref 11.1–15.9)
Hematocrit: 33.9 % — ABNORMAL LOW (ref 34.0–46.6)
IMMATURE GRANS (ABS): 0 10*3/uL (ref 0.0–0.1)
IMMATURE GRANULOCYTES: 1 %
Lymphocytes Absolute: 1.1 10*3/uL (ref 0.7–3.1)
Lymphs: 20 %
MCH: 28.2 pg (ref 26.6–33.0)
MCHC: 32.2 g/dL (ref 31.5–35.7)
MCV: 88 fL (ref 79–97)
MONOCYTES: 10 %
Monocytes Absolute: 0.6 10*3/uL (ref 0.1–0.9)
Neutrophils Absolute: 3.6 10*3/uL (ref 1.4–7.0)
Neutrophils: 66 %
Platelets: 212 10*3/uL (ref 150–379)
RBC: 3.86 x10E6/uL (ref 3.77–5.28)
RDW: 14.2 % (ref 12.3–15.4)
WBC: 5.4 10*3/uL (ref 3.4–10.8)

## 2017-03-17 MED ORDER — SODIUM POLYSTYRENE SULFONATE 15 GM/60ML PO SUSP: 15.0000 g | Freq: Every day | ORAL | 2 refills | Status: DC

## 2017-03-17 NOTE — Addendum Note (Signed)
Addended by: Shelbie Ammons on: 03/17/2017 09:15 AM   Modules accepted: Orders

## 2017-03-27 ENCOUNTER — Telehealth: Payer: Self-pay | Admitting: Family Medicine

## 2017-03-27 DIAGNOSIS — K209 Esophagitis, unspecified without bleeding: Secondary | ICD-10-CM

## 2017-03-27 MED ORDER — SUCRALFATE 1 GM/10ML PO SUSP: 1.0000 g | Freq: Four times a day (QID) | ORAL | 0 refills | Status: DC

## 2017-03-27 NOTE — Telephone Encounter (Signed)
Patient aware and verbalizes understanding. 

## 2017-03-27 NOTE — Telephone Encounter (Signed)
Will ask nursing to inform pt of alternative carafate use.   From our pharmacist: If insurance won't cover Carafate suspension, she can try dissolving the carafate tablet in water (about 1-2 ounces should be enough) and drinking mixture. She might need to rinse the glass and drink again if there is still carafate "grit".   Laroy Apple, MD [facility] 03/27/2017, 1:35 PM

## 2017-03-27 NOTE — Telephone Encounter (Signed)
Refer to GI, re-ordered carafate.   Will ask clinical pharmacist if there is a way to dissolve carafate tabs as the liquid is very expensive for her.   Laroy Apple, MD [facility] 03/27/2017, 10:50 AM

## 2017-03-27 NOTE — Telephone Encounter (Signed)
Pt notified of recommendation Verbalizes understanding 

## 2017-03-30 ENCOUNTER — Ambulatory Visit (INDEPENDENT_AMBULATORY_CARE_PROVIDER_SITE_OTHER): Payer: Medicare Other | Admitting: *Deleted

## 2017-03-30 VITALS — BP 134/55 | HR 62 | Temp 97.9°F | Ht 60.5 in | Wt 143.2 lb

## 2017-03-30 DIAGNOSIS — E875 Hyperkalemia: Secondary | ICD-10-CM | POA: Diagnosis not present

## 2017-03-30 DIAGNOSIS — Z0000 Encounter for general adult medical examination without abnormal findings: Secondary | ICD-10-CM | POA: Diagnosis not present

## 2017-03-30 NOTE — Progress Notes (Addendum)
Subjective:   [name] is a 83 y.o. female who presents for an Initial Medicare Annual Wellness Visit.  Review of Systems    Patient is here today for her Initial Medicare Annual Wellness visit. She is here with her daughter today. Patient is a 83 year old female who lives with her daughter. Patient worked most of her career at [employer]. She enjoys watching family feud and getting out in the yard when she is able. Patient states that she does not have any exercise regimen. Patient states that she feels her diet is semi-healthy when she does eat. Patient states that she has not been eating much at this time due to not having much of an appetite. Patient had 6 children 5 girls and 1 boy. Patient has 1 cat that lives inside and has several rug throughout the house. Fall hazards were discussed with patient and her daughter. Patient states that she feels like her health is worse than last year at this time due to 2 hospital stays, not being able to stay by herself and having to move in with her daughter, and not being able to drive anymore.         Objective:    Today's Vitals   03/30/17 1443 03/30/17 1447  BP: (!) 152/63 (!) 134/55  Pulse: 63 62  Temp: 97.9 F (36.6 C)   TempSrc: Oral   Weight: 143 lb 3.2 oz (65 kg)   Height: 5' 0.5" (1.537 m)    Body mass index is 27.51 kg/m.   Current Medications (verified) Outpatient Encounter Prescriptions as of 03/30/2017  Medication Sig  . ACCU-CHEK AVIVA PLUS test strip CHECK BLOOD SUGER UP TO 3 TIMES A DAY  . amLODipine (NORVASC) 10 MG tablet TAKE 1 TABLET DAILY  . aspirin EC 81 MG EC tablet Take 1 tablet (81 mg total) by mouth daily.  . carvedilol (COREG) 6.25 MG tablet Take 1 tablet (6.25 mg total) by mouth 2 (two) times daily.  . cholecalciferol (VITAMIN D) 1000 units tablet Take 1,000 Units by mouth daily.  . cloNIDine (CATAPRES) 0.1 MG tablet Take 1 tablet (0.1 mg total) by mouth 2 (two) times daily.  . diclofenac sodium  (VOLTAREN) 1 % GEL Apply 4 g topically 4 (four) times daily.  . hydrALAZINE (APRESOLINE) 10 MG tablet Take 1 tablet (10 mg total) by mouth every 8 (eight) hours.  Marland Kitchen HYDROcodone-acetaminophen (NORCO) 5-325 MG tablet Take 1 tablet by mouth every 6 (six) hours as needed for moderate pain.  Marland Kitchen insulin NPH Human (HUMULIN N) 100 UNIT/ML injection Inject 0.1 mLs (10 Units total) into the skin 2 (two) times daily before a meal.  . Insulin Syringe-Needle U-100 27G X 1/2" 1 ML MISC Use as needed  . isosorbide mononitrate (IMDUR) 120 MG 24 hr tablet TAKE 1 TABLET DAILY  . levothyroxine (SYNTHROID, LEVOTHROID) 50 MCG tablet TAKE 1 TABLET DAILY  . nitroGLYCERIN (NITROSTAT) 0.4 MG SL tablet PLACE 1 TABLET UNDER THE TONGUE AT ONSET OF CHEST PAIN EVERY 5 MINTUES UP TO 3 TIMES AS NEEDED  . pantoprazole (PROTONIX) 40 MG tablet Take 1 tablet (40 mg total) by mouth daily.  . polyethylene glycol powder (GLYCOLAX/MIRALAX) powder MIX 1 CAPFUL (17 GRAMS) WITH 8OZ OF WATER OR JUICE DAILY.  . rosuvastatin (CRESTOR) 10 MG tablet Take 1 tablet (10 mg total) by mouth daily.  . SENEXON-S 8.6-50 MG tablet TAKE 4 TABLETS BY MOUTH 3 TIMES A DAY UNTIL BOWEL MOVEMENT.  Marland Kitchen sucralfate (CARAFATE) 1  GM/10ML suspension Take 10 mLs (1 g total) by mouth every 6 (six) hours.  . torsemide (DEMADEX) 20 MG tablet Take 1 tablet (20 mg total) by mouth daily. (Patient taking differently: Take 20 mg by mouth 3 (three) times daily. )  . sodium polystyrene (KAYEXALATE) 15 GM/60ML suspension Take 60 mLs (15 g total) by mouth daily. (Patient not taking: Reported on 03/30/2017)  . [DISCONTINUED] sucralfate (CARAFATE) 1 g tablet Take 1 tablet (1 g total) by mouth 4 (four) times daily -  with meals and at bedtime. (Patient not taking: Reported on 03/30/2017)   No facility-administered encounter medications on file as of 03/30/2017.     Allergies (verified) Propoxyphene n-acetaminophen and Simvastatin   History: Past Medical History:  Diagnosis Date  .  CAD (coronary artery disease) 2003   Last catheterization 2008. Two-vessel PCI of the first OM branch and mid LAD.  Marland Kitchen CKD (chronic kidney disease)   . Congestive heart failure ([place])   . CVA (cerebral vascular accident) ([place]) [place]   . Dementia   . DJD (degenerative joint disease) of lumbar spine   . Hypercholesteremia   . Hypertension   . IDDM (insulin dependent diabetes mellitus) ([place])    Past Surgical History:  Procedure Laterality Date  . APPENDECTOMY    . KNEE SURGERY     Family History  Problem Relation Age of Onset  . Heart attack Mother 69  . Heart attack Father 6  . Diabetes Brother     RETINOPATHY   . Drug abuse Brother   . Liver cancer Brother   . Breast cancer Daughter   . Diabetes Sister    Social History   Occupational History  . Retired    Social History Main Topics  . Smoking status: Never Smoker  . Smokeless tobacco: Never Used  . Alcohol use No  . Drug use: No  . Sexual activity: No    Tobacco Counseling Counseling given: Not Answered Patient has never smoked  Activities of Daily Living In your present state of health, do you have any difficulty performing the following activities: 03/30/2017 03/04/2017  Hearing? N Y  Vision? N N  Difficulty concentrating or making decisions? Tempie Donning  Walking or climbing stairs? Y Y  Dressing or bathing? N N  Doing errands, shopping? Y Y  Preparing Food and eating ? - -  Using the Toilet? - -  In the past six months, have you accidently leaked urine? - -  Do you have problems with loss of bowel control? - -  Managing your Medications? - -  Managing your Finances? - -  Housekeeping or managing your Housekeeping? - -  Some recent data might be hidden  Patient states that at time she has difficulty remembering things.  Patient has a hard time walking and is currently using a walker and is unable to climb steps.  Patient is unable to run errands or attend appointments by herself due to not being able to  drive.   Immunizations and Health Maintenance Immunization History  Administered Date(s) Administered  . Influenza, High Dose Seasonal PF 08/07/2014, 09/09/2015  . Influenza,inj,Quad PF,36+ Mos 09/30/2013  . Influenza-Unspecified 08/28/2012, 09/05/2016  . Pneumococcal Conjugate-13 08/15/2014  . Pneumococcal-Unspecified 08/28/2010   Health Maintenance Due  Topic Date Due  . OPHTHALMOLOGY EXAM  06/08/1944  . TETANUS/TDAP  06/08/1953  . DEXA SCAN  06/09/1999  . FOOT EXAM  09/30/2014  . URINE MICROALBUMIN  12/27/2015    Patient Care  Team: Timmothy Euler, MD as PCP - General (Family Medicine) Minus Breeding, MD as Attending Physician (Cardiology)  Indicate any recent Medical Services you may have received from other than [facility] providers in the past year (date may be approximate).     Assessment:   This is a routine wellness examination for [name].   Hearing/Vision screen Patient has no concerns with her hearing or vision.  Dietary issues and exercise activities discussed:    Goals    . Exercise 3x per week (30 min per time)    . Have 3 meals a day      Depression Screen PHQ 2/9 Scores 03/30/2017 03/16/2017 01/09/2017 12/08/2016 11/08/2016 10/25/2016 10/17/2016  PHQ - 2 Score 0 0 2 0 0 0 0  PHQ- 9 Score - - 6 - - - -    Fall Risk Fall Risk  03/30/2017 03/16/2017 01/09/2017 12/08/2016 12/07/2016  Falls in the past year? Yes Yes Yes Yes Yes  Number falls in past yr: 1 1 1 1 1   Injury with Fall? No No No No No  Risk Factor Category  - - - - -  Risk for fall due to : Impaired balance/gait - - - History of fall(s);Impaired balance/gait  Risk for fall due to (comments): - - - - -  Follow up Falls prevention discussed - - - Falls evaluation completed;Education provided    Cognitive Function: MMSE - Mini Mental State Exam 03/30/2017  Orientation to time 4  Orientation to Place 3  Registration 3  Attention/ Calculation 5  Recall 2  Language- name 2 objects 2  Language- repeat 0    Language- follow 3 step command 2  Language- read & follow direction 1  Write a sentence 1  Copy design 0  Total score 23    Patient scored a 23 out of 30 on the MMSE.     Screening Tests Health Maintenance  Topic Date Due  . OPHTHALMOLOGY EXAM  06/08/1944  . TETANUS/TDAP  06/08/1953  . DEXA SCAN  06/09/1999  . FOOT EXAM  09/30/2014  . URINE MICROALBUMIN  12/27/2015  . INFLUENZA VACCINE  06/28/2017  . HEMOGLOBIN A1C  09/05/2017  . PNA vac Low Risk Adult  Completed      Plan:  Patient to follow up with PCP as Planned.  Patient to follow up with GI tomorrow for esphagitis Patient needs a dexa scan.  She does not want any immunizations at this time.  I have personally reviewed and noted the following in the patient's chart:   . Medical and social history . Use of alcohol, tobacco or illicit drugs  . Current medications and supplements . Functional ability and status . Nutritional status . Physical activity . Advanced directives . List of other physicians . Hospitalizations, surgeries, and ER visits in previous 12 months . Vitals . Screenings to include cognitive, depression, and falls . Referrals and appointments  In addition, I have reviewed and discussed with patient certain preventive protocols, quality metrics, and best practice recommendations. A written personalized care plan for preventive services as well as general preventive health recommendations were provided to patient.     Gareth Morgan, LPN   01/29/3544   I have reviewed and agree with the above AWV documentation.   Assunta Found, MD [place]

## 2017-03-30 NOTE — Patient Instructions (Addendum)
  [name] , Thank you for taking time to come for your Medicare Wellness Visit. I appreciate your ongoing commitment to your health goals. Please review the following plan we discussed and let me know if I can assist you in the future.   These are the goals we discussed: Goals    . Exercise 3x per week (30 min per time)    . Have 3 meals a day       This is a list of the screening recommended for you and due dates:  Health Maintenance  Topic Date Due  . Eye exam for diabetics  06/08/1944  . Tetanus Vaccine  06/08/1953  . DEXA scan (bone density measurement)  06/09/1999  . Complete foot exam   09/30/2014  . Urine Protein Check  12/27/2015  . Flu Shot  06/28/2017  . Hemoglobin A1C  09/05/2017  . Pneumonia vaccines  Completed   It was great to meet you! Follow up with PCP as planned Keep appointment tomorrow with GI.

## 2017-03-31 ENCOUNTER — Encounter (INDEPENDENT_AMBULATORY_CARE_PROVIDER_SITE_OTHER): Payer: Self-pay | Admitting: Internal Medicine

## 2017-03-31 ENCOUNTER — Ambulatory Visit (INDEPENDENT_AMBULATORY_CARE_PROVIDER_SITE_OTHER): Payer: Medicare Other | Admitting: Internal Medicine

## 2017-03-31 ENCOUNTER — Other Ambulatory Visit: Payer: Self-pay | Admitting: Family Medicine

## 2017-03-31 ENCOUNTER — Telehealth: Payer: Self-pay | Admitting: *Deleted

## 2017-03-31 VITALS — BP 120/52 | HR 56 | Temp 98.0°F | Ht 64.0 in | Wt 143.5 lb

## 2017-03-31 DIAGNOSIS — K209 Esophagitis, unspecified without bleeding: Secondary | ICD-10-CM

## 2017-03-31 LAB — BMP8+EGFR
BUN / CREAT RATIO: 15 (ref 12–28)
BUN: 40 mg/dL — AB (ref 8–27)
CALCIUM: 9.8 mg/dL (ref 8.7–10.3)
CHLORIDE: 102 mmol/L (ref 96–106)
CO2: 23 mmol/L (ref 18–29)
CREATININE: 2.6 mg/dL — AB (ref 0.57–1.00)
GFR, EST AFRICAN AMERICAN: 19 mL/min/{1.73_m2} — AB (ref >59)
GFR, EST NON AFRICAN AMERICAN: 17 mL/min/{1.73_m2} — AB (ref >59)
Glucose: 169 mg/dL — ABNORMAL HIGH (ref 65–99)
Potassium: 4.5 mmol/L (ref 3.5–5.2)
Sodium: 139 mmol/L (ref 134–144)

## 2017-03-31 NOTE — Patient Instructions (Addendum)
Continue the Carafate and Protonix.  OV in 4 months Stool cards x 3 sent home with patient.

## 2017-03-31 NOTE — Progress Notes (Signed)
Subjective:    Patient ID: [name], female    DOB: 29-Apr-1932, 83 y.o.   MRN: [id number]  HPI Referred by Dr. [name] for esophagitis. Admitted to [facility] in April with N,V, and midsternal chest pain.  [place] Gertie Fey was consulted.  She underwent a CT abdomen/pelvis wo CM: IMPRESSION: 1. Walls of the esophagus appear somewhat thickened throughout suggesting a diffuse esophagitis of infectious or inflammatory nature. Hx of recent NSTEMI.   Hx of chronic anemia CKD Stage 4 Hx of atrial fib.  PER GI: incidental finding of esophageal wall thickening rather diffusely on CT. Patient was asymptomatic. Doubted that this was causing her chest pain.  She received 2 units of PRBC while in hospital .There was no active bleed Her daughter states her mother's chest pain (burning) relived with the Carafate. She is taking the Protonix for GERD also.  She feels pretty good.  Her appetite is not good. There has been no weight loss. She sees Dr. Hinda Lenis She has never undergone a colonoscopy. DGD esophagram 03/06/2017 IMPRESSION: Poor esophageal motility with esophageal spasm. No evidence of stricture or mass. No evidence of mucosal irregularity. Barium tablet progressed to the distal esophagus but not into the stomach, felt to be due to poor peristalsis. No stricture. 03/04/2017 Ferritin 635 Iron 36 TIBC 259 Folate 30.5 Vitamin B12 545 CBC Latest Ref Rng & Units 03/16/2017 03/09/2017 03/08/2017  WBC 3.4 - 10.8 x10E3/uL 5.4 4.7 4.4  Hemoglobin 12.0 - 15.0 g/dL - 10.2(L) 6.4(LL)  Hematocrit 34.0 - 46.6 % 33.9(L) 31.5(L) 19.9(L)  Platelets 150 - 379 x10E3/uL 212 167 157    Review of Systems Past Medical History:  Diagnosis Date  . CAD (coronary artery disease) 2003   Last catheterization 2008. Two-vessel PCI of the first OM branch and mid LAD.  Marland Kitchen CKD (chronic kidney disease)   . Congestive heart failure ([place])   . CVA (cerebral vascular accident) ([place]) [place]   . Dementia   . DJD  (degenerative joint disease) of lumbar spine   . Hypercholesteremia   . Hypertension   . IDDM (insulin dependent diabetes mellitus) ([place])     Past Surgical History:  Procedure Laterality Date  . APPENDECTOMY    . KNEE SURGERY      Allergies  Allergen Reactions  . Propoxyphene N-Acetaminophen     Pt does not know reaction  . Simvastatin Other (See Comments)    headache    Current Outpatient Prescriptions on File Prior to Visit  Medication Sig Dispense Refill  . amLODipine (NORVASC) 10 MG tablet TAKE 1 TABLET DAILY 90 tablet 0  . aspirin EC 81 MG EC tablet Take 1 tablet (81 mg total) by mouth daily. 30 tablet 11  . carvedilol (COREG) 6.25 MG tablet Take 1 tablet (6.25 mg total) by mouth 2 (two) times daily. 60 tablet 0  . cholecalciferol (VITAMIN D) 1000 units tablet Take 1,000 Units by mouth daily.    . cloNIDine (CATAPRES) 0.1 MG tablet Take 1 tablet (0.1 mg total) by mouth 2 (two) times daily. 60 tablet 0  . diclofenac sodium (VOLTAREN) 1 % GEL Apply 4 g topically 4 (four) times daily. 100 g 2  . hydrALAZINE (APRESOLINE) 10 MG tablet Take 1 tablet (10 mg total) by mouth every 8 (eight) hours. 60 tablet 0  . HYDROcodone-acetaminophen (NORCO) 5-325 MG tablet Take 1 tablet by mouth every 6 (six) hours as needed for moderate pain. 30 tablet 0  . insulin NPH Human (HUMULIN  N) 100 UNIT/ML injection Inject 0.1 mLs (10 Units total) into the skin 2 (two) times daily before a meal. 10 mL 1  . Insulin Syringe-Needle U-100 27G X 1/2" 1 ML MISC Use as needed 100 each 2  . isosorbide mononitrate (IMDUR) 120 MG 24 hr tablet TAKE 1 TABLET DAILY 30 tablet 3  . levothyroxine (SYNTHROID, LEVOTHROID) 50 MCG tablet TAKE 1 TABLET DAILY 90 tablet 0  . nitroGLYCERIN (NITROSTAT) 0.4 MG SL tablet PLACE 1 TABLET UNDER THE TONGUE AT ONSET OF CHEST PAIN EVERY 5 MINTUES UP TO 3 TIMES AS NEEDED 25 tablet 1  . pantoprazole (PROTONIX) 40 MG tablet Take 1 tablet (40 mg total) by mouth daily. 30 tablet 0  .  polyethylene glycol powder (GLYCOLAX/MIRALAX) powder MIX 1 CAPFUL (17 GRAMS) WITH 8OZ OF WATER OR JUICE DAILY. 255 g 5  . rosuvastatin (CRESTOR) 10 MG tablet Take 1 tablet (10 mg total) by mouth daily. 30 tablet 5  . SENEXON-S 8.6-50 MG tablet TAKE 4 TABLETS BY MOUTH 3 TIMES A DAY UNTIL BOWEL MOVEMENT. 60 tablet 5  . sucralfate (CARAFATE) 1 GM/10ML suspension Take 10 mLs (1 g total) by mouth every 6 (six) hours. 420 mL 0  . torsemide (DEMADEX) 20 MG tablet Take 1 tablet (20 mg total) by mouth daily. (Patient taking differently: Take 20 mg by mouth 3 (three) times daily. ) 30 tablet 1  . ACCU-CHEK AVIVA PLUS test strip CHECK BLOOD SUGER UP TO 3 TIMES A DAY 100 each 1  . sodium polystyrene (KAYEXALATE) 15 GM/60ML suspension Take 60 mLs (15 g total) by mouth daily. (Patient not taking: Reported on 03/30/2017) 500 mL 2   No current facility-administered medications on file prior to visit.        Objective:   Physical Exam Blood pressure (!) 120/52, pulse (!) 56, temperature 98 F (36.7 C), height 5\' 4"  (1.626 m), weight 143 lb 8 oz (65.1 kg). Alert and oriented. Skin warm and dry. Oral mucosa is moist.   . Sclera anicteric, conjunctivae is pink. Thyroid not enlarged. No cervical lymphadenopathy. Lungs clear. Heart regular rate and rhythm.  Abdomen is soft. Bowel sounds are positive. No hepatomegaly. No abdominal masses felt. No tenderness.   edema to lower extremities.         Assessment & Plan:  ? Esophagitis. She feels better with the Carafate. Anemia.  She will continue the Protonix.  Stool cards x 3 home with patient.  Make appt with Dr Hinda Lenis Once I get stool studies back, will discuss with Dr Laural Golden.

## 2017-03-31 NOTE — Telephone Encounter (Signed)
Patient never picked up the kayexalate 2 weeks ago. I went ahead and had a redrawn during her AWV and her potassium in now 4.5. Patient advised not to pickup now since her potassium is now 4.5.

## 2017-04-05 ENCOUNTER — Other Ambulatory Visit: Payer: Self-pay

## 2017-04-05 MED ORDER — HYDRALAZINE HCL 10 MG PO TABS: 10.0000 mg | ORAL_TABLET | Freq: Three times a day (TID) | ORAL | 2 refills | Status: DC

## 2017-04-05 MED ORDER — PANTOPRAZOLE SODIUM 40 MG PO TBEC: 40.0000 mg | DELAYED_RELEASE_TABLET | Freq: Every day | ORAL | 2 refills | Status: DC

## 2017-04-05 NOTE — Telephone Encounter (Signed)
Ok with noi kayexelate as potassium has improved.   This is a correction to a result note on 04/05/2017.   Laroy Apple, MD [facility] 04/05/2017, 3:36 PM

## 2017-04-05 NOTE — Telephone Encounter (Signed)
Patient aware and verbalizes understanding. 

## 2017-04-18 ENCOUNTER — Encounter: Payer: Self-pay | Admitting: Family Medicine

## 2017-04-18 ENCOUNTER — Ambulatory Visit (INDEPENDENT_AMBULATORY_CARE_PROVIDER_SITE_OTHER): Payer: Medicare Other | Admitting: Family Medicine

## 2017-04-18 VITALS — BP 136/58 | HR 65 | Temp 97.2°F | Ht 64.0 in | Wt 146.0 lb

## 2017-04-18 DIAGNOSIS — K209 Esophagitis, unspecified without bleeding: Secondary | ICD-10-CM

## 2017-04-18 DIAGNOSIS — N184 Chronic kidney disease, stage 4 (severe): Secondary | ICD-10-CM | POA: Diagnosis not present

## 2017-04-18 DIAGNOSIS — D631 Anemia in chronic kidney disease: Secondary | ICD-10-CM | POA: Diagnosis not present

## 2017-04-18 NOTE — Progress Notes (Signed)
   HPI  Patient presents today here with esophagitis  Pt states that she is having intermittent severe pain similar to that of when she was diagnosed with esophagitis. Saturday night she had a prolonged episode of chest burning pain. She states this is very different than previous chest pain related to cardiac etiology.  This has been helped by Carafate most of the time, however has not been for these few episodes.  She's not tried other medications. She still taking Protonix daily. She is still tolerating foods and fluids.  Patient denies any bleeding in the stool. She is concerned about her hemoglobin levels. Her energy is at baseline, although reduced for her.  PMH: Smoking status noted ROS: Per HPI  Objective: BP (!) 136/58   Pulse 65   Temp 97.2 F (36.2 C) (Oral)   Ht '5\' 4"'$  (1.626 m)   Wt 146 lb (66.2 kg)   BMI 25.06 kg/m  Gen: NAD, alert, cooperative with exam HEENT: NCAT CV: RRR, good S1/S2, no murmur Resp: CTABL, no wheezes, non-labored Ext: No edema, warm Neuro: Alert and oriented, No gross deficits  Assessment and plan:  # Esophagitis Patient with incomplete pain relief with Carafate Will try Pepto-Bismol and Kaopectate, also will try Carafate pills instead of only liquid. Continue PPI Encouraged to complete FOBT given by GI  # Anemia of chronic renal disease Repeat labs, previously severe necessitating transfusion Transfusion threshold for her would be 8 considering CAD Would likely benefit from epogen  # Chronic kidney disease stage IV Repeat labs, encouraged to follow-up with nephrology     Orders Placed This Encounter  Procedures  . CMP14+EGFR  . CBC with Wheeler, MD [facility] 04/18/2017, 3:01 PM

## 2017-04-18 NOTE — Patient Instructions (Addendum)
Great to see you!  Try pepto bismol or kaypopectate to help coat your esophagus, continue protonix and carafate.

## 2017-04-19 ENCOUNTER — Ambulatory Visit: Payer: Medicare Other | Admitting: Family Medicine

## 2017-04-19 LAB — CBC WITH DIFFERENTIAL/PLATELET
BASOS ABS: 0 10*3/uL (ref 0.0–0.2)
Basos: 1 %
EOS (ABSOLUTE): 0.1 10*3/uL (ref 0.0–0.4)
EOS: 3 %
HEMOGLOBIN: 8.7 g/dL — AB (ref 11.1–15.9)
Hematocrit: 26.6 % — ABNORMAL LOW (ref 34.0–46.6)
IMMATURE GRANS (ABS): 0 10*3/uL (ref 0.0–0.1)
IMMATURE GRANULOCYTES: 0 %
LYMPHS: 27 %
Lymphocytes Absolute: 0.9 10*3/uL (ref 0.7–3.1)
MCH: 28.8 pg (ref 26.6–33.0)
MCHC: 32.7 g/dL (ref 31.5–35.7)
MCV: 88 fL (ref 79–97)
MONOCYTES: 10 %
Monocytes Absolute: 0.3 10*3/uL (ref 0.1–0.9)
Neutrophils Absolute: 2.1 10*3/uL (ref 1.4–7.0)
Neutrophils: 59 %
Platelets: 187 10*3/uL (ref 150–379)
RBC: 3.02 x10E6/uL — AB (ref 3.77–5.28)
RDW: 14 % (ref 12.3–15.4)
WBC: 3.5 10*3/uL (ref 3.4–10.8)

## 2017-04-19 LAB — CMP14+EGFR
ALK PHOS: 55 IU/L (ref 39–117)
ALT: 4 IU/L (ref 0–32)
AST: 7 IU/L (ref 0–40)
Albumin/Globulin Ratio: 1.9 (ref 1.2–2.2)
Albumin: 3.7 g/dL (ref 3.5–4.7)
BILIRUBIN TOTAL: 0.2 mg/dL (ref 0.0–1.2)
BUN/Creatinine Ratio: 16 (ref 12–28)
BUN: 37 mg/dL — AB (ref 8–27)
CHLORIDE: 103 mmol/L (ref 96–106)
CO2: 23 mmol/L (ref 18–29)
CREATININE: 2.32 mg/dL — AB (ref 0.57–1.00)
Calcium: 9.5 mg/dL (ref 8.7–10.3)
GFR calc non Af Amer: 19 mL/min/{1.73_m2} — ABNORMAL LOW (ref >59)
GFR, EST AFRICAN AMERICAN: 22 mL/min/{1.73_m2} — AB (ref >59)
GLUCOSE: 263 mg/dL — AB (ref 65–99)
Globulin, Total: 2 g/dL (ref 1.5–4.5)
Potassium: 4.5 mmol/L (ref 3.5–5.2)
Sodium: 140 mmol/L (ref 134–144)
TOTAL PROTEIN: 5.7 g/dL — AB (ref 6.0–8.5)

## 2017-04-30 DIAGNOSIS — J40 Bronchitis, not specified as acute or chronic: Secondary | ICD-10-CM | POA: Diagnosis not present

## 2017-04-30 DIAGNOSIS — R05 Cough: Secondary | ICD-10-CM | POA: Diagnosis not present

## 2017-05-01 ENCOUNTER — Telehealth: Payer: Self-pay | Admitting: Family Medicine

## 2017-05-01 ENCOUNTER — Ambulatory Visit: Payer: Medicare Other | Admitting: Family

## 2017-05-01 NOTE — Telephone Encounter (Signed)
Per pt's daughter, pt seen at Urgent Care on 04/30/2017 for URI Pt given Omnicef Per Dr [name], pt should only take 300mg  qd Pt's daughter notified of change

## 2017-05-02 ENCOUNTER — Other Ambulatory Visit: Payer: Self-pay | Admitting: Family Medicine

## 2017-05-04 ENCOUNTER — Encounter: Payer: Self-pay | Admitting: Family Medicine

## 2017-05-04 ENCOUNTER — Ambulatory Visit (INDEPENDENT_AMBULATORY_CARE_PROVIDER_SITE_OTHER): Payer: Medicare Other | Admitting: Family Medicine

## 2017-05-04 VITALS — BP 150/67 | HR 67 | Temp 97.7°F | Ht 64.0 in | Wt 143.0 lb

## 2017-05-04 DIAGNOSIS — N184 Chronic kidney disease, stage 4 (severe): Secondary | ICD-10-CM

## 2017-05-04 DIAGNOSIS — R39198 Other difficulties with micturition: Secondary | ICD-10-CM | POA: Diagnosis not present

## 2017-05-04 LAB — URINALYSIS, COMPLETE
Bilirubin, UA: NEGATIVE
Glucose, UA: NEGATIVE
Ketones, UA: NEGATIVE
Leukocytes, UA: NEGATIVE
Nitrite, UA: NEGATIVE
RBC, UA: NEGATIVE
Specific Gravity, UA: 1.02 (ref 1.005–1.030)
Urobilinogen, Ur: 0.2 mg/dL (ref 0.2–1.0)
pH, UA: 5.5 (ref 5.0–7.5)

## 2017-05-04 LAB — MICROSCOPIC EXAMINATION
Bacteria, UA: NONE SEEN
Epithelial Cells (non renal): >10 /hpf — AB (ref 0–10)
RBC, UA: NONE SEEN /hpf (ref 0–?)
Renal Epithel, UA: NONE SEEN /hpf

## 2017-05-04 NOTE — Progress Notes (Signed)
BP (!) 150/67   Pulse 67   Temp 97.7 F (36.5 C) (Oral)   Ht '5\' 4"'$  (1.626 m)   Wt 143 lb (64.9 kg)   BMI 24.55 kg/m    Subjective:    Patient ID: [name], female    DOB: 04-06-1932, 83 y.o.   MRN: [id number]  HPI: [name] is a 83 y.o. female presenting on 05/04/2017 for Urinary Retention (began yesterday; patient is already taking Omnicef for URI; stage IV kidney failure)   HPI Upper respiratory infection on Omnicef and new urinary retention Patient was diagnosed with an upper respiratory infection couple days ago started on Omnicef and has been taking it once a day as prescribed because of her previously known stage IV chronic kidney disease. She says since she's been taking it like that she has had less urine production that she is used to and is only urinated twice in the past couple days. She denies any fevers or chills or hematuria or dysuria. She denies any abdominal pain or flank pain. She denies any urgency but is just not making as much urine.  Relevant past medical, surgical, family and social history reviewed and updated as indicated. Interim medical history since our last visit reviewed. Allergies and medications reviewed and updated.  Review of Systems  Constitutional: Negative for chills and fever.  Respiratory: Positive for cough. Negative for chest tightness and shortness of breath.   Cardiovascular: Negative for chest pain and leg swelling.  Gastrointestinal: Negative for abdominal pain.  Genitourinary: Positive for difficulty urinating. Negative for dysuria, frequency and urgency.  Musculoskeletal: Negative for back pain and gait problem.  Skin: Negative for rash.  Neurological: Negative for light-headedness and headaches.  Psychiatric/Behavioral: Negative for agitation and behavioral problems.  All other systems reviewed and are negative.   Per HPI unless specifically indicated above        Objective:    BP (!) 150/67   Pulse 67   Temp  97.7 F (36.5 C) (Oral)   Ht '5\' 4"'$  (1.626 m)   Wt 143 lb (64.9 kg)   BMI 24.55 kg/m   Wt Readings from Last 3 Encounters:  05/04/17 143 lb (64.9 kg)  04/18/17 146 lb (66.2 kg)  03/31/17 143 lb 8 oz (65.1 kg)    Physical Exam  Constitutional: She is oriented to person, place, and time. She appears well-developed and well-nourished. No distress.  Eyes: Conjunctivae are normal.  Cardiovascular: Normal rate, regular rhythm, normal heart sounds and intact distal pulses.   No murmur heard. Pulmonary/Chest: Effort normal and breath sounds normal. No respiratory distress. She has no wheezes.  Abdominal: Soft. Bowel sounds are normal. She exhibits no distension. There is no tenderness. There is no rebound and no guarding.  Musculoskeletal: Normal range of motion.  Neurological: She is alert and oriented to person, place, and time. Coordination normal.  Skin: Skin is warm and dry. No rash noted. She is not diaphoretic.  Psychiatric: She has a normal mood and affect. Her behavior is normal.  Nursing note and vitals reviewed.  Urinalysis: 0-5 wbc's, greater than 10 epithelial cells, hyaline casts, 3+ protein, no bacteria    Assessment & Plan:   Problem List Items Addressed This Visit      Genitourinary   Chronic kidney disease, stage IV (severe) (HCC)   Relevant Orders   BMP8+EGFR    Other Visit Diagnoses    Difficulty urinating    -  Primary   Relevant Orders  Urinalysis, Complete   BMP8+EGFR      Follow up plan: Return if symptoms worsen or fail to improve.  Counseling provided for all of the vaccine components Orders Placed This Encounter  Procedures  . Urinalysis, Complete    Caryl Pina, MD [facility] 05/04/2017, 3:56 PM

## 2017-05-05 LAB — BMP8+EGFR
BUN / CREAT RATIO: 12 (ref 12–28)
BUN: 44 mg/dL — ABNORMAL HIGH (ref 8–27)
CALCIUM: 9.9 mg/dL (ref 8.7–10.3)
CO2: 23 mmol/L (ref 18–29)
Chloride: 100 mmol/L (ref 96–106)
Creatinine, Ser: 3.59 mg/dL (ref 0.57–1.00)
GFR, EST AFRICAN AMERICAN: 13 mL/min/{1.73_m2} — AB (ref >59)
GFR, EST NON AFRICAN AMERICAN: 11 mL/min/{1.73_m2} — AB (ref >59)
Glucose: 139 mg/dL — ABNORMAL HIGH (ref 65–99)
POTASSIUM: 5.5 mmol/L — AB (ref 3.5–5.2)
Sodium: 138 mmol/L (ref 134–144)

## 2017-05-12 ENCOUNTER — Other Ambulatory Visit: Payer: Medicare Other

## 2017-05-12 DIAGNOSIS — E559 Vitamin D deficiency, unspecified: Secondary | ICD-10-CM | POA: Diagnosis not present

## 2017-05-12 DIAGNOSIS — N183 Chronic kidney disease, stage 3 (moderate): Secondary | ICD-10-CM | POA: Diagnosis not present

## 2017-05-12 DIAGNOSIS — R809 Proteinuria, unspecified: Secondary | ICD-10-CM | POA: Diagnosis not present

## 2017-05-12 DIAGNOSIS — D509 Iron deficiency anemia, unspecified: Secondary | ICD-10-CM | POA: Diagnosis not present

## 2017-05-12 DIAGNOSIS — Z79899 Other long term (current) drug therapy: Secondary | ICD-10-CM | POA: Diagnosis not present

## 2017-05-12 DIAGNOSIS — I10 Essential (primary) hypertension: Secondary | ICD-10-CM | POA: Diagnosis not present

## 2017-05-16 ENCOUNTER — Other Ambulatory Visit: Payer: Medicare Other

## 2017-05-16 DIAGNOSIS — I509 Heart failure, unspecified: Secondary | ICD-10-CM | POA: Diagnosis not present

## 2017-05-16 DIAGNOSIS — E1129 Type 2 diabetes mellitus with other diabetic kidney complication: Secondary | ICD-10-CM | POA: Diagnosis not present

## 2017-05-16 DIAGNOSIS — D638 Anemia in other chronic diseases classified elsewhere: Secondary | ICD-10-CM | POA: Diagnosis not present

## 2017-05-16 DIAGNOSIS — N184 Chronic kidney disease, stage 4 (severe): Secondary | ICD-10-CM | POA: Diagnosis not present

## 2017-05-16 DIAGNOSIS — I10 Essential (primary) hypertension: Secondary | ICD-10-CM | POA: Diagnosis not present

## 2017-05-22 ENCOUNTER — Encounter (HOSPITAL_COMMUNITY)
Admission: RE | Admit: 2017-05-22 | Discharge: 2017-05-22 | Disposition: A | Discharge status: Home / Self Care | Payer: Medicare Other | Source: Ambulatory Visit | Attending: Nephrology | Admitting: Nephrology

## 2017-05-22 DIAGNOSIS — D631 Anemia in chronic kidney disease: Secondary | ICD-10-CM | POA: Diagnosis not present

## 2017-05-22 DIAGNOSIS — N189 Chronic kidney disease, unspecified: Secondary | ICD-10-CM | POA: Insufficient documentation

## 2017-05-22 LAB — POCT HEMOGLOBIN-HEMACUE: Hemoglobin: 8.2 g/dL — ABNORMAL LOW (ref 12.0–15.0)

## 2017-05-22 MED ORDER — EPOETIN ALFA 3000 UNIT/ML IJ SOLN
INTRAMUSCULAR | Status: AC
  Filled 2017-05-22: qty 2

## 2017-05-22 MED ORDER — EPOETIN ALFA 3000 UNIT/ML IJ SOLN
3000.0000 [IU] | INTRAMUSCULAR | Status: DC
  Administered 2017-05-22: 3000 [IU] via SUBCUTANEOUS

## 2017-05-22 NOTE — Progress Notes (Signed)
Results for MAIA, HANDA (MRN [id number]) as of 05/22/2017 13:31  Ref. Range 05/22/2017 13:07  Hemoglobin Latest Ref Range: 12.0 - 15.0 g/dL 8.2 (L)

## 2017-05-25 ENCOUNTER — Telehealth: Payer: Self-pay | Admitting: Family Medicine

## 2017-05-25 NOTE — Telephone Encounter (Signed)
Patient was wanting to know who called her- we did not call her and patient aware.

## 2017-06-05 ENCOUNTER — Encounter (HOSPITAL_COMMUNITY)
Admission: RE | Admit: 2017-06-05 | Discharge: 2017-06-05 | Disposition: A | Discharge status: Home / Self Care | Payer: Medicare Other | Source: Ambulatory Visit | Attending: Nephrology | Admitting: Nephrology

## 2017-06-05 DIAGNOSIS — D631 Anemia in chronic kidney disease: Secondary | ICD-10-CM | POA: Insufficient documentation

## 2017-06-05 DIAGNOSIS — N189 Chronic kidney disease, unspecified: Secondary | ICD-10-CM | POA: Diagnosis not present

## 2017-06-05 LAB — POCT HEMOGLOBIN-HEMACUE: HEMOGLOBIN: 7.9 g/dL — AB (ref 12.0–15.0)

## 2017-06-05 MED ORDER — EPOETIN ALFA 3000 UNIT/ML IJ SOLN
3000.0000 [IU] | INTRAMUSCULAR | Status: DC
  Administered 2017-06-05: 3000 [IU] via SUBCUTANEOUS

## 2017-06-05 MED ORDER — EPOETIN ALFA 3000 UNIT/ML IJ SOLN
INTRAMUSCULAR | Status: AC
  Filled 2017-06-05: qty 2

## 2017-06-05 NOTE — Progress Notes (Signed)
Results for [name], [name] (MRN [id number]) as of 06/05/2017 13:54  Ref. Range 06/05/2017 13:09  Hemoglobin Latest Ref Range: 12.0 - 15.0 g/dL 7.9 (L)  Procrit 6000 units SQ given per MD order.

## 2017-06-07 ENCOUNTER — Other Ambulatory Visit: Payer: Self-pay | Admitting: Family Medicine

## 2017-06-19 ENCOUNTER — Encounter (HOSPITAL_COMMUNITY)
Admission: RE | Admit: 2017-06-19 | Discharge: 2017-06-19 | Disposition: A | Discharge status: Home / Self Care | Payer: Medicare Other | Source: Ambulatory Visit | Attending: Nephrology | Admitting: Nephrology

## 2017-06-19 ENCOUNTER — Other Ambulatory Visit: Payer: Self-pay | Admitting: Family Medicine

## 2017-06-19 DIAGNOSIS — D631 Anemia in chronic kidney disease: Secondary | ICD-10-CM | POA: Diagnosis not present

## 2017-06-19 DIAGNOSIS — N189 Chronic kidney disease, unspecified: Secondary | ICD-10-CM | POA: Diagnosis not present

## 2017-06-19 LAB — POCT HEMOGLOBIN-HEMACUE: Hemoglobin: 9.8 g/dL — ABNORMAL LOW (ref 12.0–15.0)

## 2017-06-19 MED ORDER — EPOETIN ALFA 3000 UNIT/ML IJ SOLN
INTRAMUSCULAR | Status: AC
  Filled 2017-06-19: qty 2

## 2017-06-19 MED ORDER — EPOETIN ALFA 3000 UNIT/ML IJ SOLN
6000.0000 [IU] | Freq: Once | INTRAMUSCULAR | Status: AC
  Administered 2017-06-19: 6000 [IU] via SUBCUTANEOUS

## 2017-06-19 NOTE — Progress Notes (Signed)
Results for [name], [name] (MRN [id number]) as of 06/19/2017 13:18  Ref. Range 06/19/2017 13:03  Hemoglobin Latest Ref Range: 12.0 - 15.0 g/dL 9.8 (L)

## 2017-07-03 ENCOUNTER — Encounter (HOSPITAL_COMMUNITY): Payer: Self-pay

## 2017-07-03 ENCOUNTER — Encounter (HOSPITAL_COMMUNITY)
Admission: RE | Admit: 2017-07-03 | Discharge: 2017-07-03 | Disposition: A | Discharge status: Home / Self Care | Payer: Medicare Other | Source: Ambulatory Visit | Attending: Nephrology | Admitting: Nephrology

## 2017-07-03 ENCOUNTER — Other Ambulatory Visit (HOSPITAL_COMMUNITY)

## 2017-07-03 DIAGNOSIS — N184 Chronic kidney disease, stage 4 (severe): Secondary | ICD-10-CM | POA: Insufficient documentation

## 2017-07-03 DIAGNOSIS — D631 Anemia in chronic kidney disease: Secondary | ICD-10-CM | POA: Diagnosis not present

## 2017-07-03 MED ORDER — EPOETIN ALFA 3000 UNIT/ML IJ SOLN
3000.0000 [IU] | Freq: Once | INTRAMUSCULAR | Status: AC
  Administered 2017-07-03: 3000 [IU] via SUBCUTANEOUS

## 2017-07-03 MED ORDER — EPOETIN ALFA 3000 UNIT/ML IJ SOLN
INTRAMUSCULAR | Status: AC
  Filled 2017-07-03: qty 1

## 2017-07-03 MED ORDER — EPOETIN ALFA 3000 UNIT/ML IJ SOLN
INTRAMUSCULAR | Status: AC
  Filled 2017-07-03: qty 2

## 2017-07-04 LAB — POCT HEMOGLOBIN-HEMACUE: HEMOGLOBIN: 9.2 g/dL — AB (ref 12.0–15.0)

## 2017-07-05 NOTE — Progress Notes (Signed)
Results for [name], [name] (MRN [id number]) as of 07/05/2017 10:34  Ref. Range 07/03/2017 14:57  Hemoglobin Latest Ref Range: 12.0 - 15.0 g/dL 9.2 (L)

## 2017-07-07 ENCOUNTER — Other Ambulatory Visit: Payer: Self-pay | Admitting: Family Medicine

## 2017-07-13 ENCOUNTER — Other Ambulatory Visit: Payer: Self-pay | Admitting: *Deleted

## 2017-07-13 MED ORDER — CLONIDINE HCL 0.1 MG PO TABS: 0.1000 mg | ORAL_TABLET | Freq: Two times a day (BID) | ORAL | 0 refills | Status: DC

## 2017-07-17 ENCOUNTER — Encounter (HOSPITAL_COMMUNITY)
Admission: RE | Admit: 2017-07-17 | Discharge: 2017-07-17 | Disposition: A | Discharge status: Home / Self Care | Payer: Medicare Other | Source: Ambulatory Visit | Attending: Nephrology | Admitting: Nephrology

## 2017-07-17 DIAGNOSIS — N184 Chronic kidney disease, stage 4 (severe): Secondary | ICD-10-CM | POA: Diagnosis not present

## 2017-07-17 DIAGNOSIS — D631 Anemia in chronic kidney disease: Secondary | ICD-10-CM | POA: Diagnosis not present

## 2017-07-17 LAB — POCT HEMOGLOBIN-HEMACUE: Hemoglobin: 9.2 g/dL — ABNORMAL LOW (ref 12.0–15.0)

## 2017-07-17 MED ORDER — EPOETIN ALFA 2000 UNIT/ML IJ SOLN
INTRAMUSCULAR | Status: AC
  Filled 2017-07-17: qty 1

## 2017-07-17 MED ORDER — EPOETIN ALFA 4000 UNIT/ML IJ SOLN
INTRAMUSCULAR | Status: AC
Start: 2017-07-17 — End: 2017-07-17
  Filled 2017-07-17: qty 1

## 2017-07-17 MED ORDER — EPOETIN ALFA 2000 UNIT/ML IJ SOLN
2000.0000 [IU] | Freq: Once | INTRAMUSCULAR | Status: AC
  Administered 2017-07-17: 2000 [IU] via SUBCUTANEOUS

## 2017-07-17 MED ORDER — EPOETIN ALFA 4000 UNIT/ML IJ SOLN
4000.0000 [IU] | INTRAMUSCULAR | Status: DC
  Administered 2017-07-17: 4000 [IU] via SUBCUTANEOUS

## 2017-07-18 ENCOUNTER — Ambulatory Visit (INDEPENDENT_AMBULATORY_CARE_PROVIDER_SITE_OTHER): Payer: Medicare Other | Admitting: Family Medicine

## 2017-07-18 ENCOUNTER — Encounter: Payer: Self-pay | Admitting: Family Medicine

## 2017-07-18 ENCOUNTER — Ambulatory Visit (INDEPENDENT_AMBULATORY_CARE_PROVIDER_SITE_OTHER): Payer: Medicare Other

## 2017-07-18 VITALS — BP 136/63 | HR 61 | Temp 97.5°F | Ht 64.0 in | Wt 137.6 lb

## 2017-07-18 DIAGNOSIS — E118 Type 2 diabetes mellitus with unspecified complications: Secondary | ICD-10-CM

## 2017-07-18 DIAGNOSIS — N184 Chronic kidney disease, stage 4 (severe): Secondary | ICD-10-CM

## 2017-07-18 DIAGNOSIS — N3001 Acute cystitis with hematuria: Secondary | ICD-10-CM

## 2017-07-18 DIAGNOSIS — R05 Cough: Secondary | ICD-10-CM | POA: Diagnosis not present

## 2017-07-18 DIAGNOSIS — IMO0001 Reserved for inherently not codable concepts without codable children: Secondary | ICD-10-CM

## 2017-07-18 DIAGNOSIS — R059 Cough, unspecified: Secondary | ICD-10-CM

## 2017-07-18 DIAGNOSIS — R300 Dysuria: Secondary | ICD-10-CM | POA: Diagnosis not present

## 2017-07-18 DIAGNOSIS — Z794 Long term (current) use of insulin: Secondary | ICD-10-CM | POA: Diagnosis not present

## 2017-07-18 LAB — MICROSCOPIC EXAMINATION
RENAL EPITHEL UA: NONE SEEN /HPF
WBC, UA: >30 /hpf — AB (ref 0–?)

## 2017-07-18 LAB — URINALYSIS, COMPLETE
Bilirubin, UA: NEGATIVE
Glucose, UA: NEGATIVE
Ketones, UA: NEGATIVE
Nitrite, UA: NEGATIVE
PH UA: 5 (ref 5.0–7.5)
Specific Gravity, UA: 1.02 (ref 1.005–1.030)
Urobilinogen, Ur: 0.2 mg/dL (ref 0.2–1.0)

## 2017-07-18 LAB — BAYER DCA HB A1C WAIVED: HB A1C: 6.1 % (ref <7.0)

## 2017-07-18 MED ORDER — CEPHALEXIN 250 MG PO CAPS: 250.0000 mg | ORAL_CAPSULE | Freq: Every day | ORAL | 0 refills | Status: DC

## 2017-07-18 NOTE — Addendum Note (Signed)
Addended by: Timmothy Euler on: 07/18/2017 02:57 PM   Modules accepted: Orders

## 2017-07-18 NOTE — Progress Notes (Signed)
Results for [facility], ILLINGWORTH (MRN [id number]) as of 07/18/2017 11:35  Ref. Range 07/17/2017 12:46  Hemoglobin Latest Ref Range: 12.0 - 15.0 g/dL 9.2 (L)

## 2017-07-18 NOTE — Patient Instructions (Signed)
Great to see yoU!  Come back in 1 month unless you need [place] sooner.

## 2017-07-18 NOTE — Progress Notes (Signed)
HPI  Patient presents today here for follow up of chronic medical problems and leg swelling.   Reports flulike illness with cough about 2 weeks ago, this seems to be improving slightly she also complains of leg swelling, right greater than left that's been going on for years, however it's worse lately.  Right lower extremity has been more swollen than left chronically, however her swelling overall is worse. She states that it's improved with elevating her feet. Worse with exercise and walking.  Nice fever, chills, sweats.  She does have some dysuria and suprapubic pressure.  She has a follow-up with her nephrologist in one week.  Cough hasn't been improving, however it is still lingering.  PMH: Smoking status noted ROS: Per HPI  Objective: BP 136/63   Pulse 61   Temp (!) 97.5 F (36.4 C) (Oral)   Ht '5\' 4"'$  (1.626 m)   Wt 137 lb 9.6 oz (62.4 kg)   BMI 23.62 kg/m  Gen: NAD, alert, cooperative with exam HEENT: NCAT,, oropharynx clear, nares clear CV: RRR, good S1/S2, no murmur Resp: Nonlabored, good air movement, persistent rhonchi in the right lower lung field Ext: 1-2+ pitting edema BL,  Neuro: Alert and oriented, No gross deficits  Assessment and plan:  # Cough Improving No signs of pulmonary edema, chest x-ray pending.   # Dysuria Urinalysis were placed, patient may have to return the sample. No signs of urosepsis or serious infection.  # Diabetes A1c previously recently well controlled A1c pending, clinically stable, patient still taking twice daily NPH.  # Chronic kidney disease stage IV to V P labs today, patient has follow-up with nephrology in one week. This is likely the source of leg edema along with venous stasis.     Orders Placed This Encounter  Procedures  . DG Chest 2 View    Standing Status:   Future    Number of Occurrences:   1    Standing Expiration Date:   09/17/2018    Order Specific Question:   Reason for Exam (SYMPTOM  OR  DIAGNOSIS REQUIRED)    Answer:   cough    Order Specific Question:   Preferred imaging location?    Answer:   Internal    Order Specific Question:   Radiology Contrast Protocol - do NOT remove file path    Answer:   [email or web address]  . BMP8+EGFR  . Bayer DCA Hb A1c Waived  . Urinalysis, Complete  . CBC with Differential/Platelet  . Home Health    Scheduling Instructions:     Family requesting [employer] care    Order Specific Question:   To provide the following care/treatments    Answer:   RN    Order Specific Question:   To provide the following care/treatments    Answer:   [facility]  . Face-to-face encounter (required for Medicare/Medicaid patients)    I Kenn File certify that this patient is under my care and that I, or a nurse practitioner or physician's assistant working with me, had a face-to-face encounter that meets the physician face-to-face encounter requirements with this patient on 07/18/2017. The encounter with the patient was in whole, or in part for the following medical condition(s) which is the primary reason for home health care (List medical condition): CKD 5, R4YH, Diastolic CHF, Previous stroke    Order Specific Question:   The encounter with the patient was in whole, or in part, for the following medical condition, which is the primary reason  for home health care    Answer:   Chronic kidneyt disease, T2DM    Order Specific Question:   I certify that, based on my findings, the following services are medically necessary home health services    Answer:   Nursing    Order Specific Question:   Reason for Medically Necessary Home Health Services    Answer:   Skilled Nursing- Skilled Assessment/Observation    Order Specific Question:   My clinical findings support the need for the above services    Answer:   Unable to leave home safely without assistance and/or assistive device    Order Specific Question:    Further, I certify that my clinical findings support that this patient is homebound due to:    Answer:   Unable to leave home safely without assistance    Meds ordered this encounter  Medications  . VELTASSA 8.4 g packet    Sig: MIX AND DIS 1 PACKET IN WATER AND DRINK PO ONCE D.    Refill:  [place], MD [facility] 07/18/2017, 2:00 PM

## 2017-07-19 LAB — CBC WITH DIFFERENTIAL/PLATELET
BASOS ABS: 0 10*3/uL (ref 0.0–0.2)
BASOS: 0 %
EOS (ABSOLUTE): 0.1 10*3/uL (ref 0.0–0.4)
Eos: 2 %
Hematocrit: 27.8 % — ABNORMAL LOW (ref 34.0–46.6)
Hemoglobin: 9.1 g/dL — ABNORMAL LOW (ref 11.1–15.9)
IMMATURE GRANS (ABS): 0 10*3/uL (ref 0.0–0.1)
IMMATURE GRANULOCYTES: 1 %
LYMPHS ABS: 1.1 10*3/uL (ref 0.7–3.1)
Lymphs: 35 %
MCH: 28.3 pg (ref 26.6–33.0)
MCHC: 32.7 g/dL (ref 31.5–35.7)
MCV: 86 fL (ref 79–97)
MONOS ABS: 0.2 10*3/uL (ref 0.1–0.9)
Monocytes: 6 %
NEUTROS ABS: 1.8 10*3/uL (ref 1.4–7.0)
Neutrophils: 56 %
PLATELETS: 210 10*3/uL (ref 150–379)
RBC: 3.22 x10E6/uL — ABNORMAL LOW (ref 3.77–5.28)
RDW: 13.9 % (ref 12.3–15.4)
WBC: 3.2 10*3/uL — ABNORMAL LOW (ref 3.4–10.8)

## 2017-07-19 LAB — BMP8+EGFR
BUN / CREAT RATIO: 12 (ref 12–28)
BUN: 35 mg/dL — ABNORMAL HIGH (ref 8–27)
CHLORIDE: 103 mmol/L (ref 96–106)
CO2: 22 mmol/L (ref 20–29)
Calcium: 9.2 mg/dL (ref 8.7–10.3)
Creatinine, Ser: 2.83 mg/dL — ABNORMAL HIGH (ref 0.57–1.00)
GFR calc Af Amer: 17 mL/min/{1.73_m2} — ABNORMAL LOW (ref >59)
GFR, EST NON AFRICAN AMERICAN: 15 mL/min/{1.73_m2} — AB (ref >59)
Glucose: 153 mg/dL — ABNORMAL HIGH (ref 65–99)
POTASSIUM: 5.1 mmol/L (ref 3.5–5.2)
SODIUM: 140 mmol/L (ref 134–144)

## 2017-07-19 LAB — URINE CULTURE

## 2017-07-25 DIAGNOSIS — D638 Anemia in other chronic diseases classified elsewhere: Secondary | ICD-10-CM | POA: Diagnosis not present

## 2017-07-25 DIAGNOSIS — N184 Chronic kidney disease, stage 4 (severe): Secondary | ICD-10-CM | POA: Diagnosis not present

## 2017-07-25 DIAGNOSIS — E875 Hyperkalemia: Secondary | ICD-10-CM | POA: Diagnosis not present

## 2017-07-25 DIAGNOSIS — R809 Proteinuria, unspecified: Secondary | ICD-10-CM | POA: Diagnosis not present

## 2017-07-25 DIAGNOSIS — I10 Essential (primary) hypertension: Secondary | ICD-10-CM | POA: Diagnosis not present

## 2017-08-01 ENCOUNTER — Encounter (HOSPITAL_COMMUNITY)
Admission: RE | Admit: 2017-08-01 | Discharge: 2017-08-01 | Disposition: A | Discharge status: Home / Self Care | Payer: Medicare Other | Source: Ambulatory Visit | Attending: Nephrology | Admitting: Nephrology

## 2017-08-01 ENCOUNTER — Ambulatory Visit (INDEPENDENT_AMBULATORY_CARE_PROVIDER_SITE_OTHER): Payer: Medicare Other | Admitting: Internal Medicine

## 2017-08-01 DIAGNOSIS — D631 Anemia in chronic kidney disease: Secondary | ICD-10-CM | POA: Diagnosis not present

## 2017-08-01 DIAGNOSIS — N184 Chronic kidney disease, stage 4 (severe): Secondary | ICD-10-CM | POA: Diagnosis not present

## 2017-08-01 LAB — HEMOGLOBIN AND HEMATOCRIT, BLOOD
HCT: 27.2 % — ABNORMAL LOW (ref 36.0–46.0)
Hemoglobin: 8.6 g/dL — ABNORMAL LOW (ref 12.0–15.0)

## 2017-08-01 MED ORDER — EPOETIN ALFA 3000 UNIT/ML IJ SOLN
3000.0000 [IU] | Freq: Once | INTRAMUSCULAR | Status: AC
  Administered 2017-08-01: 3000 [IU] via SUBCUTANEOUS

## 2017-08-01 MED ORDER — EPOETIN ALFA 3000 UNIT/ML IJ SOLN
INTRAMUSCULAR | Status: AC
  Filled 2017-08-01: qty 2

## 2017-08-01 NOTE — Progress Notes (Signed)
Results for ALAA, MULLALLY (MRN [id number]) as of 08/01/2017 12:29  Ref. Range 08/01/2017 12:15  Hemoglobin Latest Ref Range: 12.0 - 15.0 g/dL 8.6 (L)  HCT Latest Ref Range: 36.0 - 46.0 % 27.2 (L)

## 2017-08-10 ENCOUNTER — Encounter: Payer: Self-pay | Admitting: Family Medicine

## 2017-08-10 ENCOUNTER — Ambulatory Visit (INDEPENDENT_AMBULATORY_CARE_PROVIDER_SITE_OTHER): Payer: Medicare Other | Admitting: Family Medicine

## 2017-08-10 VITALS — BP 170/94 | HR 85 | Temp 97.6°F | Ht 64.0 in | Wt 149.2 lb

## 2017-08-10 DIAGNOSIS — R05 Cough: Secondary | ICD-10-CM

## 2017-08-10 DIAGNOSIS — R059 Cough, unspecified: Secondary | ICD-10-CM

## 2017-08-10 MED ORDER — DOXYCYCLINE HYCLATE 100 MG PO TABS: 100.0000 mg | ORAL_TABLET | Freq: Two times a day (BID) | ORAL | 0 refills | Status: DC

## 2017-08-10 MED ORDER — PREDNISONE 20 MG PO TABS: 40.0000 mg | ORAL_TABLET | Freq: Every day | ORAL | 0 refills | Status: DC

## 2017-08-10 NOTE — Patient Instructions (Signed)
Great to see you!  Please see GI again to discuss your esophagus.   Please finish all antibiotics  Come back in 1 month unless you need [place] sooner.

## 2017-08-10 NOTE — Progress Notes (Signed)
   HPI  Patient presents today here with cough, elevated blood pressure, and heartburn.  Cough Patient had cough for several weeks, she was seen for this almost 1 month ago by myself. She has not improved. Chest x-ray was clear that time.  He not take her blood pressure medications this morning, her daughter is very concerned that getting her medications without food, patient does not want to eat much lately.  The patient states "if it's my time, just let me go". Her daughter wants to take her to the hospital, however she states no. I do not see a clear reason to seek emergency medical care today, however I did review the importance of taking her blood pressure medications.  She has continued heartburn, she has esophagitis described by CT scan. She is taking Protonix and Carafate. I strongly recommended seeing GI again.  PMH: Smoking status noted ROS: Per HPI  Objective: BP (!) 170/94   Pulse 85   Temp 97.6 F (36.4 C) (Oral)   Ht 5\' 4"  (1.626 m)   Wt 149 lb 3.2 oz (67.7 kg)   BMI 25.61 kg/m  Gen: NAD, alert, cooperative with exam HEENT: NCAT, EOMI, PERRL CV: RRR, good S1/S2, no murmur Resp: CTABL, no wheezes, non-labored Abd: SNTND, BS present, no guarding or organomegaly Ext: No edema, warm Neuro: Alert and oriented, No gross deficits  Assessment and plan:  # Esophagitis Recommended continuing PPI plus Carafate Strongly recommended calling GI for follow-up. This could be contributing to her cough  # Cough Unclear etiology, could be partially due to severe GERD related upper airway irritation Given persistence I have treated her with doxycycline and prednisone, on lung exam she does have small faint wheeze scattered  Discussed hyperglycemia likely with prednsone # Hypertension Severe, uncontrolled Reported 150s on her meds.  Patient did not take medication today No red flags or signs of end-organ damage Pt will take maeds as soon as he is home.     Meds  ordered this encounter  Medications  . doxycycline (VIBRA-TABS) 100 MG tablet    Sig: Take 1 tablet (100 mg total) by mouth 2 (two) times daily. 1 po bid    Dispense:  20 tablet    Refill:  0  . predniSONE (DELTASONE) 20 MG tablet    Sig: Take 2 tablets (40 mg total) by mouth daily with breakfast.    Dispense:  10 tablet    Refill:  0    Laroy Apple, MD [facility] 08/10/2017, 3:01 PM

## 2017-08-15 ENCOUNTER — Encounter (HOSPITAL_COMMUNITY): Admission: RE | Admit: 2017-08-15 | Payer: Medicare Other | Source: Ambulatory Visit

## 2017-08-18 ENCOUNTER — Ambulatory Visit: Payer: Medicare Other | Admitting: Family Medicine

## 2017-08-21 ENCOUNTER — Encounter: Payer: Self-pay | Admitting: Family Medicine

## 2017-08-22 ENCOUNTER — Encounter (HOSPITAL_COMMUNITY)
Admission: RE | Admit: 2017-08-22 | Discharge: 2017-08-22 | Disposition: A | Discharge status: Home / Self Care | Payer: Medicare Other | Source: Ambulatory Visit | Attending: Nephrology | Admitting: Nephrology

## 2017-08-22 DIAGNOSIS — N184 Chronic kidney disease, stage 4 (severe): Secondary | ICD-10-CM | POA: Diagnosis not present

## 2017-08-22 DIAGNOSIS — D631 Anemia in chronic kidney disease: Secondary | ICD-10-CM | POA: Diagnosis not present

## 2017-08-22 LAB — POCT HEMOGLOBIN-HEMACUE: Hemoglobin: 9.6 g/dL — ABNORMAL LOW (ref 12.0–15.0)

## 2017-08-22 MED ORDER — EPOETIN ALFA 4000 UNIT/ML IJ SOLN: 4000.0000 [IU] | Freq: Once | INTRAMUSCULAR | Status: DC

## 2017-08-22 MED ORDER — EPOETIN ALFA 3000 UNIT/ML IJ SOLN
3000.0000 [IU] | Freq: Once | INTRAMUSCULAR | Status: AC
  Administered 2017-08-22: 3000 [IU] via SUBCUTANEOUS

## 2017-08-22 MED ORDER — EPOETIN ALFA 2000 UNIT/ML IJ SOLN: 2000.0000 [IU] | Freq: Once | INTRAMUSCULAR | Status: DC

## 2017-08-22 MED ORDER — EPOETIN ALFA 3000 UNIT/ML IJ SOLN
INTRAMUSCULAR | Status: AC
  Filled 2017-08-22: qty 2

## 2017-08-22 NOTE — Progress Notes (Signed)
Results for [name], [name] (MRN [id number]) as of 08/22/2017 14:59  Ref. Range 08/22/2017 14:22  Hemoglobin Latest Ref Range: 12.0 - 15.0 g/dL 9.6 (L)

## 2017-08-29 ENCOUNTER — Other Ambulatory Visit: Payer: Self-pay | Admitting: Family Medicine

## 2017-09-03 ENCOUNTER — Emergency Department (HOSPITAL_COMMUNITY): Payer: Medicare Other

## 2017-09-03 ENCOUNTER — Encounter (HOSPITAL_COMMUNITY): Payer: Self-pay | Admitting: *Deleted

## 2017-09-03 ENCOUNTER — Emergency Department (HOSPITAL_COMMUNITY)
Admission: EM | Admit: 2017-09-03 | Discharge: 2017-09-04 | Disposition: A | Discharge status: Home / Self Care | Payer: Medicare Other | Attending: Emergency Medicine | Admitting: Emergency Medicine

## 2017-09-03 DIAGNOSIS — E1122 Type 2 diabetes mellitus with diabetic chronic kidney disease: Secondary | ICD-10-CM | POA: Insufficient documentation

## 2017-09-03 DIAGNOSIS — J189 Pneumonia, unspecified organism: Secondary | ICD-10-CM

## 2017-09-03 DIAGNOSIS — I130 Hypertensive heart and chronic kidney disease with heart failure and stage 1 through stage 4 chronic kidney disease, or unspecified chronic kidney disease: Secondary | ICD-10-CM | POA: Diagnosis not present

## 2017-09-03 DIAGNOSIS — I5030 Unspecified diastolic (congestive) heart failure: Secondary | ICD-10-CM | POA: Diagnosis not present

## 2017-09-03 DIAGNOSIS — Z794 Long term (current) use of insulin: Secondary | ICD-10-CM | POA: Insufficient documentation

## 2017-09-03 DIAGNOSIS — J181 Lobar pneumonia, unspecified organism: Secondary | ICD-10-CM | POA: Diagnosis not present

## 2017-09-03 DIAGNOSIS — I2510 Atherosclerotic heart disease of native coronary artery without angina pectoris: Secondary | ICD-10-CM | POA: Insufficient documentation

## 2017-09-03 DIAGNOSIS — N184 Chronic kidney disease, stage 4 (severe): Secondary | ICD-10-CM | POA: Diagnosis not present

## 2017-09-03 DIAGNOSIS — Z79899 Other long term (current) drug therapy: Secondary | ICD-10-CM | POA: Insufficient documentation

## 2017-09-03 DIAGNOSIS — R05 Cough: Secondary | ICD-10-CM | POA: Diagnosis not present

## 2017-09-03 DIAGNOSIS — Z7982 Long term (current) use of aspirin: Secondary | ICD-10-CM | POA: Insufficient documentation

## 2017-09-03 LAB — CBC WITH DIFFERENTIAL/PLATELET
BASOS ABS: 0 10*3/uL (ref 0.0–0.1)
BASOS PCT: 1 %
Eosinophils Absolute: 0.1 10*3/uL (ref 0.0–0.7)
Eosinophils Relative: 2 %
HEMATOCRIT: 27 % — AB (ref 36.0–46.0)
HEMOGLOBIN: 8.8 g/dL — AB (ref 12.0–15.0)
LYMPHS ABS: 0.8 10*3/uL (ref 0.7–4.0)
Lymphocytes Relative: 26 %
MCH: 29.3 pg (ref 26.0–34.0)
MCHC: 32.6 g/dL (ref 30.0–36.0)
MCV: 90 fL (ref 78.0–100.0)
MONOS PCT: 13 %
Monocytes Absolute: 0.4 10*3/uL (ref 0.1–1.0)
Neutro Abs: 1.7 10*3/uL (ref 1.7–7.7)
Neutrophils Relative %: 58 %
Platelets: 169 10*3/uL (ref 150–400)
RBC: 3 MIL/uL — AB (ref 3.87–5.11)
RDW: 14 % (ref 11.5–15.5)
WBC: 2.9 10*3/uL — ABNORMAL LOW (ref 4.0–10.5)

## 2017-09-03 MED ORDER — DEXTROSE 5 % IV SOLN
1.0000 g | Freq: Once | INTRAVENOUS | Status: AC
  Administered 2017-09-04: 1 g via INTRAVENOUS
  Filled 2017-09-03: qty 10

## 2017-09-03 MED ORDER — AZITHROMYCIN 250 MG PO TABS
500.0000 mg | ORAL_TABLET | Freq: Once | ORAL | Status: AC
  Administered 2017-09-04: 500 mg via ORAL
  Filled 2017-09-03: qty 2

## 2017-09-03 NOTE — ED Triage Notes (Signed)
Pt c/o not feeling well, cough that white thick productive sputum, runny nose that started yesterday,

## 2017-09-04 ENCOUNTER — Encounter: Payer: Self-pay | Admitting: Family Medicine

## 2017-09-04 ENCOUNTER — Ambulatory Visit (INDEPENDENT_AMBULATORY_CARE_PROVIDER_SITE_OTHER): Payer: Medicare Other | Admitting: Family Medicine

## 2017-09-04 VITALS — BP 135/58 | HR 63 | Temp 97.2°F | Ht 63.0 in | Wt 142.4 lb

## 2017-09-04 DIAGNOSIS — J189 Pneumonia, unspecified organism: Secondary | ICD-10-CM | POA: Diagnosis not present

## 2017-09-04 LAB — BASIC METABOLIC PANEL
Anion gap: 10 (ref 5–15)
BUN: 53 mg/dL — ABNORMAL HIGH (ref 6–20)
CO2: 24 mmol/L (ref 22–32)
Calcium: 9.5 mg/dL (ref 8.9–10.3)
Chloride: 105 mmol/L (ref 101–111)
Creatinine, Ser: 3.76 mg/dL — ABNORMAL HIGH (ref 0.44–1.00)
GFR calc non Af Amer: 10 mL/min — ABNORMAL LOW (ref >60)
GFR, EST AFRICAN AMERICAN: 12 mL/min — AB (ref >60)
Glucose, Bld: 183 mg/dL — ABNORMAL HIGH (ref 65–99)
Potassium: 5 mmol/L (ref 3.5–5.1)
SODIUM: 139 mmol/L (ref 135–145)

## 2017-09-04 MED ORDER — DOXYCYCLINE HYCLATE 100 MG PO CAPS: 100.0000 mg | ORAL_CAPSULE | Freq: Two times a day (BID) | ORAL | 0 refills | Status: DC

## 2017-09-04 MED ORDER — AMOXICILLIN-POT CLAVULANATE 875-125 MG PO TABS: 1.0000 | ORAL_TABLET | Freq: Two times a day (BID) | ORAL | 0 refills | Status: DC

## 2017-09-04 NOTE — Patient Instructions (Addendum)
Great to see you!  Come back in 3-4 days for follow up.    Community-Acquired Pneumonia, Adult Pneumonia is an infection of the lungs. There are different types of pneumonia. One type can develop while a person is in a hospital. A different type, called community-acquired pneumonia, develops in people who are not, or have not recently been, in the hospital or other health care facility. What are the causes? Pneumonia may be caused by bacteria, viruses, or funguses. Community-acquired pneumonia is often caused by Streptococcus pneumonia bacteria. These bacteria are often passed from one person to another by breathing in droplets from the cough or sneeze of an infected person. What increases the risk? The condition is more likely to develop in:  People who havechronic diseases, such as chronic obstructive pulmonary disease (COPD), asthma, congestive heart failure, cystic fibrosis, diabetes, or kidney disease.  People who haveearly-stage or late-stage HIV.  People who havesickle cell disease.  People who havehad their spleen removed (splenectomy).  People who havepoor Human resources officer.  People who havemedical conditions that increase the risk of breathing in (aspirating) secretions their own mouth and nose.  People who havea weakened immune system (immunocompromised).  People who smoke.  People whotravel to areas where pneumonia-causing germs commonly exist.  People whoare around animal habitats or animals that have pneumonia-causing germs, including birds, bats, rabbits, cats, and farm animals.  What are the signs or symptoms? Symptoms of this condition include:  Adry cough.  A wet (productive) cough.  Fever.  Sweating.  Chest pain, especially when breathing deeply or coughing.  Rapid breathing or difficulty breathing.  Shortness of breath.  Shaking chills.  Fatigue.  Muscle aches.  How is this diagnosed? Your health care provider will take a medical  history and perform a physical exam. You may also have other tests, including:  Imaging studies of your chest, including X-rays.  Tests to check your blood oxygen level and other blood gases.  Other tests on blood, mucus (sputum), fluid around your lungs (pleural fluid), and urine.  If your pneumonia is severe, other tests may be done to identify the specific cause of your illness. How is this treated? The type of treatment that you receive depends on many factors, such as the cause of your pneumonia, the medicines you take, and other medical conditions that you have. For most adults, treatment and recovery from pneumonia may occur at home. In some cases, treatment must happen in a hospital. Treatment may include:  Antibiotic medicines, if the pneumonia was caused by bacteria.  Antiviral medicines, if the pneumonia was caused by a virus.  Medicines that are given by mouth or through an IV tube.  Oxygen.  Respiratory therapy.  Although rare, treating severe pneumonia may include:  Mechanical ventilation. This is done if you are not breathing well on your own and you cannot maintain a safe blood oxygen level.  Thoracentesis. This procedureremoves fluid around one lung or both lungs to help you breathe better.  Follow these instructions at home:  Take over-the-counter and prescription medicines only as told by your health care provider. ? Only takecough medicine if you are losing sleep. Understand that cough medicine can prevent your body's natural ability to remove mucus from your lungs. ? If you were prescribed an antibiotic medicine, take it as told by your health care provider. Do not stop taking the antibiotic even if you start to feel better.  Sleep in a semi-upright position at night. Try sleeping in a reclining chair, or  place a few pillows under your head.  Do not use tobacco products, including cigarettes, chewing tobacco, and e-cigarettes. If you need help quitting, ask  your health care provider.  Drink enough water to keep your urine clear or pale yellow. This will help to thin out mucus secretions in your lungs. How is this prevented? There are ways that you can decrease your risk of developing community-acquired pneumonia. Consider getting a pneumococcal vaccine if:  You are older than 81 years of age.  You are older than 81 years of age and are undergoing cancer treatment, have chronic lung disease, or have other medical conditions that affect your immune system. Ask your health care provider if this applies to you.  There are different types and schedules of pneumococcal vaccines. Ask your health care provider which vaccination option is best for you. You may also prevent community-acquired pneumonia if you take these actions:  Get an influenza vaccine every year. Ask your health care provider which type of influenza vaccine is best for you.  Go to the dentist on a regular basis.  Wash your hands often. Use hand sanitizer if soap and water are not available.  Contact a health care provider if:  You have a fever.  You are losing sleep because you cannot control your cough with cough medicine. Get help right away if:  You have worsening shortness of breath.  You have increased chest pain.  Your sickness becomes worse, especially if you are an older adult or have a weakened immune system.  You cough up blood. This information is not intended to replace advice given to you by your health care provider. Make sure you discuss any questions you have with your health care provider. Document Released: 11/14/2005 Document Revised: 03/24/2016 Document Reviewed: 03/11/2015 Elsevier Interactive Patient Education  2017 [employer].

## 2017-09-04 NOTE — ED Provider Notes (Signed)
[facility] DEPT Provider Note   CSN: [id number] Arrival date & time: 09/03/17  2009     History   Chief Complaint Chief Complaint  Patient presents with  . Cough    HPI Lalania Haseman Rosario is a 83 y.o. female.  The history is provided by the patient and a relative.  Cough  This is a new problem. The current episode started yesterday. The problem occurs every few minutes. The problem has been gradually worsening. The cough is productive of sputum. There has been no fever. Associated symptoms include chills. Pertinent negatives include no chest pain, no shortness of breath and no wheezing. She has tried nothing for the symptoms. She is not a smoker.  Patient with h/o CAD, CKD presents with cough over past 24 hours No hemoptysis She reports thick white sputum production No SOB No dyspnea on exertion  no new LE edema She reports sneezing as well No CP No syncope She has mild fatigue  Past Medical History:  Diagnosis Date  . CAD (coronary artery disease) 2003   Last catheterization 2008. Two-vessel PCI of the first OM branch and mid LAD.  Marland Kitchen CKD (chronic kidney disease)   . Congestive heart failure ([place])   . CVA (cerebral vascular accident) ([place]) [place]   . Dementia   . DJD (degenerative joint disease) of lumbar spine   . Hypercholesteremia   . Hypertension   . IDDM (insulin dependent diabetes mellitus) [facility])     Patient Active Problem List   Diagnosis Date Noted  . Abnormal CT scan of lung   . Chest pain 03/04/2017  . Esophagitis   . Occult blood in stools 11/29/2016  . UTI (urinary tract infection) 11/29/2016  . Urinary retention 11/29/2016  . PAF (paroxysmal atrial fibrillation) ([place])   . Pressure injury of skin 11/22/2016  . Aortic valve sclerosis-2D June 2016 10/05/2016  . Chest pain with high risk of acute coronary syndrome 10/05/2016  . 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 Pulmonary hypertension ([place])   . Insulin dependent diabetes mellitus  with complications ([place])   . Chronic kidney disease, stage IV (severe) ([place])   . Other constipation   . Failure to thrive in adult   . Hypomagnesemia   . CAD S/P PCI '03 and '08 05/11/2015  . Anemia in chronic kidney disease 05/11/2015  . Benign paroxysmal positional vertigo 03/21/2014  . HTN (hypertension) 08/09/2013  . Lumbar spondylosis 05/16/2013  . Overweight 05/15/2013  . Varicose veins of lower extremities with other complications 81/11/7508  . Normocytic anemia 10/29/2012  . Hypoglycemia 10/29/2012  . NSVT (nonsustained ventricular tachycardia) ([place]) 10/28/2012  . OA (osteoarthritis) 10/27/2012  . DIASTOLIC HEART FAILURE, CHRONIC 07/20/2010  . Cerebrovascular disease 03/09/2010  . Hyperlipidemia 12/05/2008    Past Surgical History:  Procedure Laterality Date  . APPENDECTOMY    . KNEE SURGERY      OB History    No data available       Home Medications    Prior to Admission medications   Medication Sig Start Date End Date Taking? Authorizing Provider  ACCU-CHEK AVIVA PLUS test strip CHECK BLOOD SUGER UP TO 3 TIMES A DAY 06/21/17   Timmothy Euler, MD  amLODipine (NORVASC) 10 MG tablet TAKE 1 TABLET DAILY 06/08/17   Timmothy Euler, MD  aspirin EC 81 MG EC tablet Take 1 tablet (81 mg total) by mouth daily. 10/08/16   Bhagat, Crista Luria, PA  carvedilol (COREG) 6.25 MG tablet TAKE  (1)  TABLET TWICE A DAY. 04/03/17   Timmothy Euler, MD  cholecalciferol (VITAMIN D) 1000 units tablet Take 1,000 Units by mouth daily.    [provider]  cloNIDine (CATAPRES) 0.1 MG tablet TAKE  (1)  TABLET TWICE A DAY. 08/30/17   Timmothy Euler, MD  diclofenac sodium (VOLTAREN) 1 % GEL Apply 4 g topically 4 (four) times daily. 07/29/16   Timmothy Euler, MD  doxycycline (VIBRA-TABS) 100 MG tablet Take 1 tablet (100 mg total) by mouth 2 (two) times daily. 1 po bid 08/10/17   Timmothy Euler, MD  hydrALAZINE (APRESOLINE) 10 MG tablet TAKE  (1)  TABLET  EVERY EIGHT  HOURS. 08/30/17   Timmothy Euler, MD  HYDROcodone-acetaminophen (NORCO) 5-325 MG tablet Take 1 tablet by mouth every 6 (six) hours as needed for moderate pain. 12/08/16   Timmothy Euler, MD  insulin NPH Human (HUMULIN N) 100 UNIT/ML injection Inject 0.1 mLs (10 Units total) into the skin 2 (two) times daily before a meal. 02/03/17   Timmothy Euler, MD  Insulin Syringe-Needle U-100 27G X 1/2" 1 ML MISC Use as needed 02/03/17   Timmothy Euler, MD  isosorbide mononitrate (IMDUR) 120 MG 24 hr tablet TAKE 1 TABLET DAILY 07/07/17   Timmothy Euler, MD  levothyroxine (SYNTHROID, LEVOTHROID) 50 MCG tablet TAKE 1 TABLET DAILY 04/03/17   Timmothy Euler, MD  nitroGLYCERIN (NITROSTAT) 0.4 MG SL tablet PLACE 1 TABLET UNDER THE TONGUE AT ONSET OF CHEST PAIN EVERY 5 MINTUES UP TO 3 TIMES AS NEEDED 02/27/17   Timmothy Euler, MD  pantoprazole (PROTONIX) 40 MG tablet TAKE 1 TABLET DAILY 07/07/17   Timmothy Euler, MD  polyethylene glycol powder (GLYCOLAX/MIRALAX) powder MIX 1 CAPFUL (17 GRAMS) WITH 8OZ OF WATER OR JUICE DAILY. 12/13/16   Timmothy Euler, MD  predniSONE (DELTASONE) 20 MG tablet Take 2 tablets (40 mg total) by mouth daily with breakfast. 08/10/17   Timmothy Euler, MD  rosuvastatin (CRESTOR) 10 MG tablet TAKE 1 TABLET DAILY 05/02/17   Timmothy Euler, MD  SENEXON-S 8.6-50 MG tablet TAKE 4 TABLETS BY MOUTH 3 TIMES A DAY UNTIL BOWEL MOVEMENT. 12/13/16   Timmothy Euler, MD  sodium polystyrene (KAYEXALATE) 15 GM/60ML suspension Take 60 mLs (15 g total) by mouth daily. 03/17/17   Timmothy Euler, MD  sucralfate (CARAFATE) 1 GM/10ML suspension Take 10 mLs (1 g total) by mouth every 6 (six) hours. 03/27/17   Timmothy Euler, MD  torsemide (DEMADEX) 20 MG tablet Take 1 tablet (20 mg total) by mouth daily. Patient taking differently: Take 20 mg by mouth 3 (three) times daily.  11/08/16   Timmothy Euler, MD  VELTASSA 8.4 g packet [place] 1 PACKET IN WATER AND DRINK PO ONCE D.  05/26/17   [provider]    Family History Family History  Problem Relation Age of Onset  . Heart attack Mother 52  . Heart attack Father 22  . Diabetes Brother        RETINOPATHY   . Drug abuse Brother   . Liver cancer Brother   . Breast cancer Daughter   . Diabetes Sister     Social History Social History  Substance Use Topics  . Smoking status: Never Smoker  . Smokeless tobacco: Never Used  . Alcohol use No     Allergies   Propoxyphene n-acetaminophen and Simvastatin   Review of Systems Review of Systems  Constitutional: Positive  for chills and fatigue. Negative for fever.  Respiratory: Positive for cough. Negative for shortness of breath and wheezing.   Cardiovascular: Negative for chest pain.  Gastrointestinal: Negative for vomiting.  All other systems reviewed and are negative.    Physical Exam Updated Vital Signs BP (!) 159/64   Pulse 65   Temp 98.3 F (36.8 C) (Oral)   Resp 16   Ht 1.6 m (5\' 3" )   Wt 67.6 kg (149 lb)   SpO2 97%   BMI 26.39 kg/m   Physical Exam CONSTITUTIONAL: Elderly, no acute distress HEAD: Normocephalic/atraumatic EYES: EOMI/PERRL ENMT: Mucous membranes moist NECK: supple no meningeal signs SPINE/BACK:entire spine nontender CV: S1/S2 noted  LUNGS: minimal crackles in the bases, no distress noted ABDOMEN: soft, nontender  NEURO: Pt is awake/alert/appropriate, moves all extremitiesx4.  No facial droop.   EXTREMITIES: pulses normal/equal, full ROM, chronic LE edema noted.   SKIN: warm, healed bruising to extremities.   PSYCH: no abnormalities of mood noted, alert and oriented to situation   ED Treatments / Results  Labs (all labs ordered are listed, but only abnormal results are displayed) Labs Reviewed  BASIC METABOLIC PANEL - Abnormal; Notable for the following:       Result Value   Glucose, Bld 183 (*)    BUN 53 (*)    Creatinine, Ser 3.76 (*)    GFR calc non Af Amer 10 (*)    GFR calc Af Amer 12 (*)      All other components within normal limits  CBC WITH DIFFERENTIAL/PLATELET - Abnormal; Notable for the following:    WBC 2.9 (*)    RBC 3.00 (*)    Hemoglobin 8.8 (*)    HCT 27.0 (*)    All other components within normal limits    EKG  EKG Interpretation None       Radiology Dg Chest 2 View  Result Date: 09/03/2017 CLINICAL DATA:  Cough EXAM: CHEST  2 VIEW COMPARISON:  07/18/2017 chest radiograph. FINDINGS: Stable cardiomediastinal silhouette with normal heart size and aortic atherosclerosis. No pneumothorax. No pleural effusions. Stable chronic mild pleural-parenchymal scarring at the costophrenic angles. Hyperinflated lungs. No pulmonary edema. New patchy opacity at the medial lung bases. IMPRESSION: 1. New patchy opacity at the medial lung bases, which may indicate aspiration and/or pneumonia. Recommend follow-up PA and lateral post treatment chest radiographs in 4-6 weeks. 2. Hyperinflated lungs, suggesting obstructive lung disease. Electronically Signed   By: Ilona Sorrel M.D.   On: 09/03/2017 21:44    Procedures Procedures    Medications Ordered in ED Medications  cefTRIAXone (ROCEPHIN) 1 g in dextrose 5 % 50 mL IVPB (1 g Intravenous New Bag/Given 09/04/17 0034)  azithromycin (ZITHROMAX) tablet 500 mg (500 mg Oral Given 09/04/17 0034)     Initial Impression / Assessment and Plan / ED Course  I have reviewed the triage vital signs and the nursing notes.  Pertinent labs & imaging results that were available during my care of the patient were reviewed by me and considered in my medical decision making (see chart for details).     12:02 AM Pt insistent on going home However she agrees to receive IV medicines and have labs done as well 1:18 AM Pt stable Vitals appropriate BP (!) 139/58 (BP Location: Left Arm)   Pulse 66   Temp 98.3 F (36.8 C) (Oral)   Resp 16   Ht 1.6 m (5\' 3" )   Wt 67.6 kg (149 lb)   SpO2 98%  BMI 26.39 kg/m  She is still requesting d/c  home I discussed risk of going, but she insists she feels well and will f/u as outpatient Due to co-morbidities, she will get dual antibiotic therapy We discussed strict ER Return precautions Advised close PCP followup   Final Clinical Impressions(s) / ED Diagnoses   Final diagnoses:  Community acquired pneumonia of right middle lobe of lung (HCC)    New Prescriptions New Prescriptions   AMOXICILLIN-CLAVULANATE (AUGMENTIN) 875-125 MG TABLET    Take 1 tablet by mouth 2 (two) times daily. One po bid x 7 days   DOXYCYCLINE (VIBRAMYCIN) 100 MG CAPSULE    Take 1 capsule (100 mg total) by mouth 2 (two) times daily. One po bid x 7 days     Ripley Fraise, MD 09/04/17 [phone]

## 2017-09-04 NOTE — Progress Notes (Signed)
   HPI  Patient presents today here for ED follow up for CAP  Pt was seen late last night/ this am in the ED and dx with CAP. She was given IV rocephin and PO azithro, WBC was 2.9- Admission was recommended but she declined.   She is interested in admission if symps worsen. Her appetite is decreased but she is tolerating POs well.   She is being double covered with doxy and augmentin.    PMH: Smoking status noted ROS: Per HPI  Objective: BP (!) 135/58   Pulse 63   Temp (!) 97.2 F (36.2 C) (Oral)   Ht 5\' 3"  (1.6 m)   Wt 142 lb 6.4 oz (64.6 kg)   BMI 25.23 kg/m  Gen: NAD, alert, cooperative with exam HEENT: NCAT CV: RRR, good S1/S2, no murmur Resp: non labored, soft crackles in the R base.  Ext: No edema, warm Neuro: Alert and oriented, No gross deficits  Assessment and plan:  # CAP No changes in symptoms Continue Tx from ED, appreciate their care.  Dicussed reasons to seek ED care, again explained that she could be admitted if she would like.  Follow up on Friday, low threshold for additional care.    Laroy Apple, MD [facility] 09/04/2017, 11:27 AM

## 2017-09-05 ENCOUNTER — Encounter (HOSPITAL_COMMUNITY)
Admission: RE | Admit: 2017-09-05 | Discharge: 2017-09-05 | Disposition: A | Discharge status: Home / Self Care | Payer: Medicare Other | Source: Ambulatory Visit | Attending: Nephrology | Admitting: Nephrology

## 2017-09-05 ENCOUNTER — Ambulatory Visit: Payer: Medicare Other | Admitting: *Deleted

## 2017-09-05 DIAGNOSIS — N184 Chronic kidney disease, stage 4 (severe): Secondary | ICD-10-CM | POA: Insufficient documentation

## 2017-09-05 DIAGNOSIS — D631 Anemia in chronic kidney disease: Secondary | ICD-10-CM | POA: Diagnosis not present

## 2017-09-05 DIAGNOSIS — Z5181 Encounter for therapeutic drug level monitoring: Secondary | ICD-10-CM | POA: Diagnosis not present

## 2017-09-05 DIAGNOSIS — Z79899 Other long term (current) drug therapy: Secondary | ICD-10-CM | POA: Insufficient documentation

## 2017-09-05 MED ORDER — EPOETIN ALFA 3000 UNIT/ML IJ SOLN
3000.0000 [IU] | Freq: Once | INTRAMUSCULAR | Status: AC
  Administered 2017-09-05: 6000 [IU] via SUBCUTANEOUS

## 2017-09-05 MED ORDER — EPOETIN ALFA 3000 UNIT/ML IJ SOLN
3000.0000 [IU] | Freq: Once | INTRAMUSCULAR | Status: DC
Start: 2017-09-05 — End: 2017-09-06

## 2017-09-05 MED ORDER — EPOETIN ALFA 3000 UNIT/ML IJ SOLN
INTRAMUSCULAR | Status: AC
  Filled 2017-09-05: qty 2

## 2017-09-08 ENCOUNTER — Ambulatory Visit: Payer: Medicare Other | Admitting: Family Medicine

## 2017-09-11 NOTE — Progress Notes (Signed)
Results for ADRIANNA, DUDAS (MRN [id number]) as of 09/11/2017 11:49  Ref. Range 09/03/2017 41:32  BASIC METABOLIC PANEL Unknown Rpt (A)  Sodium Latest Ref Range: 135 - 145 mmol/L 139  Potassium Latest Ref Range: 3.5 - 5.1 mmol/L 5.0  Chloride Latest Ref Range: 101 - 111 mmol/L 105  CO2 Latest Ref Range: 22 - 32 mmol/L 24  Glucose Latest Ref Range: 65 - 99 mg/dL 183 (H)  BUN Latest Ref Range: 6 - 20 mg/dL 53 (H)  Creatinine Latest Ref Range: 0.44 - 1.00 mg/dL 3.76 (H)  Calcium Latest Ref Range: 8.9 - 10.3 mg/dL 9.5  Anion gap Latest Ref Range: 5 - 15  10  GFR, Est Non African American Latest Ref Range: >60 mL/min 10 (L)  GFR, Est African American Latest Ref Range: >60 mL/min 12 (L)  WBC Latest Ref Range: 4.0 - 10.5 K/uL 2.9 (L)  RBC Latest Ref Range: 3.87 - 5.11 MIL/uL 3.00 (L)  Hemoglobin Latest Ref Range: 12.0 - 15.0 g/dL 8.8 (L)  HCT Latest Ref Range: 36.0 - 46.0 % 27.0 (L)  MCV Latest Ref Range: 78.0 - 100.0 fL 90.0  MCH Latest Ref Range: 26.0 - 34.0 pg 29.3  MCHC Latest Ref Range: 30.0 - 36.0 g/dL 32.6  RDW Latest Ref Range: 11.5 - 15.5 % 14.0  Platelets Latest Ref Range: 150 - 400 K/uL 169  Neutrophils Latest Units: % 58  Lymphocytes Latest Units: % 26  Monocytes Relative Latest Units: % 13  Eosinophil Latest Units: % 2  Basophil Latest Units: % 1  NEUT# Latest Ref Range: 1.7 - 7.7 K/uL 1.7  Lymphocyte # Latest Ref Range: 0.7 - 4.0 K/uL 0.8  Monocyte # Latest Ref Range: 0.1 - 1.0 K/uL 0.4  Eosinophils Absolute Latest Ref Range: 0.0 - 0.7 K/uL 0.1  Basophils Absolute Latest Ref Range: 0.0 - 0.1 K/uL 0.0

## 2017-09-18 ENCOUNTER — Other Ambulatory Visit: Payer: Self-pay | Admitting: Family Medicine

## 2017-09-18 ENCOUNTER — Ambulatory Visit (INDEPENDENT_AMBULATORY_CARE_PROVIDER_SITE_OTHER): Payer: Medicare Other | Admitting: Family Medicine

## 2017-09-18 ENCOUNTER — Encounter: Payer: Self-pay | Admitting: Family Medicine

## 2017-09-18 VITALS — BP 153/58 | HR 62 | Temp 96.6°F | Ht 63.0 in | Wt 150.2 lb

## 2017-09-18 DIAGNOSIS — J189 Pneumonia, unspecified organism: Secondary | ICD-10-CM | POA: Diagnosis not present

## 2017-09-18 DIAGNOSIS — M7989 Other specified soft tissue disorders: Secondary | ICD-10-CM

## 2017-09-18 NOTE — Patient Instructions (Signed)
Great to see you!  Come back in 2-3 weeks for re-check pneumonia

## 2017-09-18 NOTE — Progress Notes (Signed)
   HPI  Patient presents today here for follow-up of pneumonia.  Patient was seen in the emergency room on 8 diagnosed with pneumonia.  She was treated with Augmentin plus doxycycline.  She reports that she is still coughing up thick white sputum.  She does not feel much better.  She tolerated medication well. She was recommended to have full admission and insisted on going home.  She complains of persistent leg pain which has no aggravating or alleviating factors.  She states that it hurts about the same all the time. Her daughter is with her and requests cough syrup with codeine.  Tolerating food and fluids like usual.  She ate [employer] before coming today.  PMH: Smoking status noted ROS: Per HPI  Objective: BP (!) 153/58   Pulse 62   Temp (!) 96.6 F (35.9 C) (Oral)   Ht 5\' 3"  (1.6 m)   Wt 150 lb 3.2 oz (68.1 kg)   SpO2 100%   BMI 26.61 kg/m  Gen: NAD, alert, cooperative with exam HEENT: NCAT, oropharynx moist and clear CV: RRR, good S1/S2, no murmur Resp: CTABL, no wheezes, non-labored Abd: SNTND, BS present, no guarding or organomegaly Ext: Right greater than left leg edema, 2+ pitting edema on the right, left with 1+ pitting edema. Neuro: Alert and oriented, No gross deficits  Assessment and plan:  #Community-acquired pneumonia Patient with infiltrate seen on previous x-ray, follow-up x-ray 4-6 weeks after that (that would be 2-4 weeks from now). Patient appears well.  She does not appear toxic. Recommended WBC to see if there is any signs of active infection, otherwise would recommend considering this treated with postinfectious cough. I did explain the necessity of repeat imaging.  If she has any worsening symptoms please come back  Leg swelling Asymmetric leg swelling is stable from previous exams, likely secondary to renal failure  Pain likely secondary to swelling, recommended elevating legs, trying compression stockings. I am deferring her narcotic pain  medications for now  Orders Placed This Encounter  Procedures  . CBC with [place], MD [facility] 09/18/2017, 5:05 PM

## 2017-09-19 ENCOUNTER — Encounter (HOSPITAL_COMMUNITY)
Admission: RE | Admit: 2017-09-19 | Discharge: 2017-09-19 | Disposition: A | Discharge status: Home / Self Care | Payer: Medicare Other | Source: Ambulatory Visit | Attending: Nephrology | Admitting: Nephrology

## 2017-09-19 ENCOUNTER — Encounter (HOSPITAL_COMMUNITY): Payer: Self-pay

## 2017-09-19 DIAGNOSIS — Z5181 Encounter for therapeutic drug level monitoring: Secondary | ICD-10-CM | POA: Diagnosis not present

## 2017-09-19 DIAGNOSIS — Z79899 Other long term (current) drug therapy: Secondary | ICD-10-CM | POA: Diagnosis not present

## 2017-09-19 DIAGNOSIS — D631 Anemia in chronic kidney disease: Secondary | ICD-10-CM | POA: Diagnosis not present

## 2017-09-19 DIAGNOSIS — N184 Chronic kidney disease, stage 4 (severe): Secondary | ICD-10-CM | POA: Diagnosis not present

## 2017-09-19 LAB — CBC WITH DIFFERENTIAL/PLATELET
BASOS ABS: 0 10*3/uL (ref 0.0–0.2)
Basos: 1 %
EOS (ABSOLUTE): 0.1 10*3/uL (ref 0.0–0.4)
Eos: 4 %
Hematocrit: 27.3 % — ABNORMAL LOW (ref 34.0–46.6)
Hemoglobin: 9 g/dL — ABNORMAL LOW (ref 11.1–15.9)
IMMATURE GRANS (ABS): 0 10*3/uL (ref 0.0–0.1)
Immature Granulocytes: 0 %
LYMPHS ABS: 1.1 10*3/uL (ref 0.7–3.1)
LYMPHS: 33 %
MCH: 29.3 pg (ref 26.6–33.0)
MCHC: 33 g/dL (ref 31.5–35.7)
MCV: 89 fL (ref 79–97)
Monocytes Absolute: 0.2 10*3/uL (ref 0.1–0.9)
Monocytes: 6 %
NEUTROS ABS: 1.8 10*3/uL (ref 1.4–7.0)
Neutrophils: 56 %
PLATELETS: 147 10*3/uL — AB (ref 150–379)
RBC: 3.07 x10E6/uL — AB (ref 3.77–5.28)
RDW: 14.8 % (ref 12.3–15.4)
WBC: 3.2 10*3/uL — AB (ref 3.4–10.8)

## 2017-09-19 LAB — POCT HEMOGLOBIN-HEMACUE: HEMOGLOBIN: 9 g/dL — AB (ref 12.0–15.0)

## 2017-09-19 MED ORDER — EPOETIN ALFA 3000 UNIT/ML IJ SOLN
6000.0000 [IU] | Freq: Once | INTRAMUSCULAR | Status: AC
  Administered 2017-09-19: 6000 [IU] via SUBCUTANEOUS
  Filled 2017-09-19: qty 2

## 2017-09-19 NOTE — Progress Notes (Signed)
Results for [name], [name] (MRN [id number]) as of 09/19/2017 13:52  Ref. Range 09/19/2017 13:04  Hemoglobin Latest Ref Range: 12.0 - 15.0 g/dL 9.0 (L)

## 2017-09-20 ENCOUNTER — Emergency Department (HOSPITAL_COMMUNITY): Payer: Medicare Other

## 2017-09-20 ENCOUNTER — Inpatient Hospital Stay (HOSPITAL_COMMUNITY)
Admission: EM | Admit: 2017-09-20 | Discharge: 2017-09-24 | DRG: 291 | Disposition: A | Discharge status: Home Health | Payer: Medicare Other | Attending: Internal Medicine | Admitting: Internal Medicine

## 2017-09-20 ENCOUNTER — Encounter (HOSPITAL_COMMUNITY): Payer: Self-pay | Admitting: Cardiology

## 2017-09-20 DIAGNOSIS — Z888 Allergy status to other drugs, medicaments and biological substances status: Secondary | ICD-10-CM

## 2017-09-20 DIAGNOSIS — I509 Heart failure, unspecified: Secondary | ICD-10-CM

## 2017-09-20 DIAGNOSIS — E118 Type 2 diabetes mellitus with unspecified complications: Secondary | ICD-10-CM

## 2017-09-20 DIAGNOSIS — Z885 Allergy status to narcotic agent status: Secondary | ICD-10-CM | POA: Diagnosis not present

## 2017-09-20 DIAGNOSIS — I340 Nonrheumatic mitral (valve) insufficiency: Secondary | ICD-10-CM | POA: Diagnosis not present

## 2017-09-20 DIAGNOSIS — E875 Hyperkalemia: Secondary | ICD-10-CM | POA: Diagnosis present

## 2017-09-20 DIAGNOSIS — Z79890 Hormone replacement therapy (postmenopausal): Secondary | ICD-10-CM | POA: Diagnosis not present

## 2017-09-20 DIAGNOSIS — D696 Thrombocytopenia, unspecified: Secondary | ICD-10-CM | POA: Diagnosis present

## 2017-09-20 DIAGNOSIS — J90 Pleural effusion, not elsewhere classified: Secondary | ICD-10-CM

## 2017-09-20 DIAGNOSIS — Z8673 Personal history of transient ischemic attack (TIA), and cerebral infarction without residual deficits: Secondary | ICD-10-CM | POA: Diagnosis not present

## 2017-09-20 DIAGNOSIS — N179 Acute kidney failure, unspecified: Secondary | ICD-10-CM | POA: Diagnosis not present

## 2017-09-20 DIAGNOSIS — Z79899 Other long term (current) drug therapy: Secondary | ICD-10-CM

## 2017-09-20 DIAGNOSIS — I130 Hypertensive heart and chronic kidney disease with heart failure and stage 1 through stage 4 chronic kidney disease, or unspecified chronic kidney disease: Principal | ICD-10-CM | POA: Diagnosis present

## 2017-09-20 DIAGNOSIS — D631 Anemia in chronic kidney disease: Secondary | ICD-10-CM | POA: Diagnosis present

## 2017-09-20 DIAGNOSIS — N289 Disorder of kidney and ureter, unspecified: Secondary | ICD-10-CM

## 2017-09-20 DIAGNOSIS — Z7982 Long term (current) use of aspirin: Secondary | ICD-10-CM

## 2017-09-20 DIAGNOSIS — IMO0001 Reserved for inherently not codable concepts without codable children: Secondary | ICD-10-CM

## 2017-09-20 DIAGNOSIS — N039 Chronic nephritic syndrome with unspecified morphologic changes: Secondary | ICD-10-CM | POA: Diagnosis not present

## 2017-09-20 DIAGNOSIS — Z66 Do not resuscitate: Secondary | ICD-10-CM | POA: Diagnosis not present

## 2017-09-20 DIAGNOSIS — I2510 Atherosclerotic heart disease of native coronary artery without angina pectoris: Secondary | ICD-10-CM

## 2017-09-20 DIAGNOSIS — E1122 Type 2 diabetes mellitus with diabetic chronic kidney disease: Secondary | ICD-10-CM | POA: Diagnosis not present

## 2017-09-20 DIAGNOSIS — D649 Anemia, unspecified: Secondary | ICD-10-CM | POA: Diagnosis not present

## 2017-09-20 DIAGNOSIS — R05 Cough: Secondary | ICD-10-CM | POA: Diagnosis not present

## 2017-09-20 DIAGNOSIS — F0390 Unspecified dementia without behavioral disturbance: Secondary | ICD-10-CM | POA: Diagnosis present

## 2017-09-20 DIAGNOSIS — N184 Chronic kidney disease, stage 4 (severe): Secondary | ICD-10-CM | POA: Diagnosis present

## 2017-09-20 DIAGNOSIS — M79606 Pain in leg, unspecified: Secondary | ICD-10-CM

## 2017-09-20 DIAGNOSIS — R600 Localized edema: Secondary | ICD-10-CM | POA: Diagnosis not present

## 2017-09-20 DIAGNOSIS — K761 Chronic passive congestion of liver: Secondary | ICD-10-CM | POA: Diagnosis present

## 2017-09-20 DIAGNOSIS — I5033 Acute on chronic diastolic (congestive) heart failure: Secondary | ICD-10-CM | POA: Diagnosis not present

## 2017-09-20 DIAGNOSIS — E785 Hyperlipidemia, unspecified: Secondary | ICD-10-CM | POA: Diagnosis not present

## 2017-09-20 DIAGNOSIS — N189 Chronic kidney disease, unspecified: Secondary | ICD-10-CM

## 2017-09-20 DIAGNOSIS — R079 Chest pain, unspecified: Secondary | ICD-10-CM | POA: Diagnosis not present

## 2017-09-20 DIAGNOSIS — I10 Essential (primary) hypertension: Secondary | ICD-10-CM | POA: Diagnosis present

## 2017-09-20 DIAGNOSIS — Z794 Long term (current) use of insulin: Secondary | ICD-10-CM | POA: Diagnosis not present

## 2017-09-20 LAB — BASIC METABOLIC PANEL
ANION GAP: 10 (ref 5–15)
BUN: 51 mg/dL — ABNORMAL HIGH (ref 6–20)
CALCIUM: 9.8 mg/dL (ref 8.9–10.3)
CO2: 21 mmol/L — ABNORMAL LOW (ref 22–32)
Chloride: 109 mmol/L (ref 101–111)
Creatinine, Ser: 3.68 mg/dL — ABNORMAL HIGH (ref 0.44–1.00)
GFR, EST AFRICAN AMERICAN: 12 mL/min — AB (ref >60)
GFR, EST NON AFRICAN AMERICAN: 10 mL/min — AB (ref >60)
GLUCOSE: 228 mg/dL — AB (ref 65–99)
Potassium: 5.4 mmol/L — ABNORMAL HIGH (ref 3.5–5.1)
Sodium: 140 mmol/L (ref 135–145)

## 2017-09-20 LAB — URINALYSIS, ROUTINE W REFLEX MICROSCOPIC
Bilirubin Urine: NEGATIVE
GLUCOSE, UA: 150 mg/dL — AB
HGB URINE DIPSTICK: NEGATIVE
Ketones, ur: NEGATIVE mg/dL
Leukocytes, UA: NEGATIVE
Nitrite: NEGATIVE
PH: 6 (ref 5.0–8.0)
Protein, ur: >=300 mg/dL — AB
SPECIFIC GRAVITY, URINE: 1.014 (ref 1.005–1.030)

## 2017-09-20 LAB — GLUCOSE, CAPILLARY: Glucose-Capillary: 193 mg/dL — ABNORMAL HIGH (ref 65–99)

## 2017-09-20 LAB — LACTIC ACID, PLASMA: Lactic Acid, Venous: 0.9 mmol/L (ref 0.5–1.9)

## 2017-09-20 LAB — CBC WITH DIFFERENTIAL/PLATELET
Basophils Absolute: 0 10*3/uL (ref 0.0–0.1)
Basophils Relative: 1 %
EOS ABS: 0.1 10*3/uL (ref 0.0–0.7)
EOS PCT: 2 %
HCT: 30.6 % — ABNORMAL LOW (ref 36.0–46.0)
Hemoglobin: 9.5 g/dL — ABNORMAL LOW (ref 12.0–15.0)
LYMPHS ABS: 0.9 10*3/uL (ref 0.7–4.0)
Lymphocytes Relative: 22 %
MCH: 29.1 pg (ref 26.0–34.0)
MCHC: 31 g/dL (ref 30.0–36.0)
MCV: 93.9 fL (ref 78.0–100.0)
MONO ABS: 0.3 10*3/uL (ref 0.1–1.0)
Monocytes Relative: 8 %
Neutro Abs: 2.7 10*3/uL (ref 1.7–7.7)
Neutrophils Relative %: 68 %
PLATELETS: 100 10*3/uL — AB (ref 150–400)
RBC: 3.26 MIL/uL — ABNORMAL LOW (ref 3.87–5.11)
RDW: 14.7 % (ref 11.5–15.5)
WBC: 3.9 10*3/uL — AB (ref 4.0–10.5)

## 2017-09-20 LAB — TROPONIN I

## 2017-09-20 LAB — BRAIN NATRIURETIC PEPTIDE: B Natriuretic Peptide: 1243 pg/mL — ABNORMAL HIGH (ref 0.0–100.0)

## 2017-09-20 LAB — POCT I-STAT TROPONIN I: Troponin i, poc: 0.01 ng/mL (ref 0.00–0.08)

## 2017-09-20 MED ORDER — SODIUM CHLORIDE 0.9% FLUSH: 3.0000 mL | INTRAVENOUS | Status: DC | PRN

## 2017-09-20 MED ORDER — CLONIDINE HCL 0.1 MG PO TABS
0.1000 mg | ORAL_TABLET | Freq: Two times a day (BID) | ORAL | Status: DC
  Administered 2017-09-20: 0.1 mg via ORAL
  Filled 2017-09-20: qty 1

## 2017-09-20 MED ORDER — CARVEDILOL 3.125 MG PO TABS
6.2500 mg | ORAL_TABLET | Freq: Two times a day (BID) | ORAL | Status: DC
  Administered 2017-09-20: 6.25 mg via ORAL
  Filled 2017-09-20: qty 2

## 2017-09-20 MED ORDER — ASPIRIN 81 MG PO CHEW
81.0000 mg | CHEWABLE_TABLET | Freq: Every day | ORAL | Status: DC
  Administered 2017-09-21 – 2017-09-24 (×4): 81 mg via ORAL
  Filled 2017-09-20 (×4): qty 1

## 2017-09-20 MED ORDER — FUROSEMIDE 10 MG/ML IJ SOLN
40.0000 mg | Freq: Once | INTRAMUSCULAR | Status: AC
  Administered 2017-09-20: 40 mg via INTRAVENOUS
  Filled 2017-09-20: qty 4

## 2017-09-20 MED ORDER — ASPIRIN 81 MG PO TBEC: 81.0000 mg | DELAYED_RELEASE_TABLET | Freq: Every day | ORAL | Status: DC

## 2017-09-20 MED ORDER — INSULIN ASPART 100 UNIT/ML ~~LOC~~ SOLN: 0.0000 [IU] | Freq: Every day | SUBCUTANEOUS | Status: DC

## 2017-09-20 MED ORDER — ACETAMINOPHEN 325 MG PO TABS
650.0000 mg | ORAL_TABLET | ORAL | Status: DC | PRN
  Administered 2017-09-22: 650 mg via ORAL
  Filled 2017-09-20: qty 2

## 2017-09-20 MED ORDER — PANTOPRAZOLE SODIUM 40 MG PO TBEC
40.0000 mg | DELAYED_RELEASE_TABLET | Freq: Every day | ORAL | Status: DC
  Administered 2017-09-21 – 2017-09-22 (×2): 40 mg via ORAL
  Filled 2017-09-20 (×2): qty 1

## 2017-09-20 MED ORDER — ROSUVASTATIN CALCIUM 10 MG PO TABS
10.0000 mg | ORAL_TABLET | Freq: Every day | ORAL | Status: DC
  Administered 2017-09-20 – 2017-09-23 (×4): 10 mg via ORAL
  Filled 2017-09-20 (×5): qty 1

## 2017-09-20 MED ORDER — ONDANSETRON HCL 4 MG/2ML IJ SOLN
4.0000 mg | Freq: Four times a day (QID) | INTRAMUSCULAR | Status: DC | PRN
  Administered 2017-09-22 (×2): 4 mg via INTRAVENOUS
  Filled 2017-09-20 (×2): qty 2

## 2017-09-20 MED ORDER — HYDROCODONE-ACETAMINOPHEN 5-325 MG PO TABS
0.5000 | ORAL_TABLET | Freq: Four times a day (QID) | ORAL | Status: DC | PRN
  Administered 2017-09-20 – 2017-09-24 (×9): 1 via ORAL
  Filled 2017-09-20 (×10): qty 1

## 2017-09-20 MED ORDER — FUROSEMIDE 10 MG/ML IJ SOLN
40.0000 mg | Freq: Two times a day (BID) | INTRAMUSCULAR | Status: DC
  Administered 2017-09-21 – 2017-09-23 (×5): 40 mg via INTRAVENOUS
  Filled 2017-09-20 (×5): qty 4

## 2017-09-20 MED ORDER — SODIUM CHLORIDE 0.9% FLUSH
3.0000 mL | Freq: Two times a day (BID) | INTRAVENOUS | Status: DC
  Administered 2017-09-20 – 2017-09-23 (×7): 3 mL via INTRAVENOUS

## 2017-09-20 MED ORDER — INSULIN ASPART 100 UNIT/ML ~~LOC~~ SOLN: 0.0000 [IU] | Freq: Three times a day (TID) | SUBCUTANEOUS | Status: DC

## 2017-09-20 MED ORDER — ISOSORBIDE MONONITRATE ER 60 MG PO TB24
120.0000 mg | ORAL_TABLET | Freq: Every day | ORAL | Status: DC
  Administered 2017-09-21 – 2017-09-24 (×4): 120 mg via ORAL
  Filled 2017-09-20 (×4): qty 2

## 2017-09-20 MED ORDER — HYDRALAZINE HCL 10 MG PO TABS
10.0000 mg | ORAL_TABLET | Freq: Three times a day (TID) | ORAL | Status: DC
  Administered 2017-09-20 – 2017-09-21 (×2): 10 mg via ORAL
  Filled 2017-09-20 (×2): qty 1

## 2017-09-20 MED ORDER — INSULIN NPH (HUMAN) (ISOPHANE) 100 UNIT/ML ~~LOC~~ SUSP
10.0000 [IU] | Freq: Two times a day (BID) | SUBCUTANEOUS | Status: DC
  Administered 2017-09-20: 10 [IU] via SUBCUTANEOUS
  Filled 2017-09-20 (×2): qty 10

## 2017-09-20 MED ORDER — LEVOTHYROXINE SODIUM 50 MCG PO TABS
50.0000 ug | ORAL_TABLET | Freq: Every day | ORAL | Status: DC
  Administered 2017-09-21 – 2017-09-24 (×4): 50 ug via ORAL
  Filled 2017-09-20 (×4): qty 1

## 2017-09-20 MED ORDER — SODIUM CHLORIDE 0.9 % IV SOLN: 250.0000 mL | INTRAVENOUS | Status: DC | PRN

## 2017-09-20 NOTE — H&P (Signed)
History and Physical    [name]:[id number] DOB: 07-Jul-1932 DOA: 09/20/2017  PCP: Timmothy Euler, MD Consultants:  [name] - nephrology; Bronson Ing - cardiology Patient coming from:  Home - lives with daughter; [facility]: Daughter, [phone]  Chief Complaint: chest pain  HPI: [name] is a 83 y.o. female with medical history significant of DM; HTN; HLD; dementia; grade 2 diastolic dysfunction on Echo I 4/18; stage IV CKD; and CAD presenting with "Just hurting".  She went to the doctor Monday with complaint of ongoing cough after PNA diagnosed on 10/8; she also had LE edema at that visit but did not apparently mention the chest pain (there is no mention in his note).  She says that her chest has been bothering her and she tried to get him to give her medicine for it.  She has also had thick white stuff coming up from her throat.  Symptoms have been ongoing for about 2 months.  No SOB.  She sleeps in a recliner so no lying flat.  No PND.  She does report some trouble breathing for the last couple of months.  Substernal chest pain for about 2 months.  It comes and goes and "in a way" it has gotten worse.  +LE edema for weeks.  She was previously taking 3 torsemide daily and it was making her renal failure worse so they decreased it to once daily several months ago.  In August, they talked to him about changing it back but Dr. [name] didn't want her to.   ED Course: B pleural effusions on CXR with elevated BNP.  IV Lasix given.  EKG with stable abnormalities, negative troponin x 1.  Review of Systems: As per HPI; otherwise review of systems reviewed and negative.   Ambulatory Status:  Ambulates with a walker  Past Medical History:  Diagnosis Date  . CAD (coronary artery disease) 2003   Last catheterization 2008. Two-vessel PCI of the first OM branch and mid LAD.  Marland Kitchen CKD (chronic kidney disease)   . Congestive heart failure ([place]) 02/2017   grade 2 diastolic dysfunction  .  CVA (cerebral vascular accident) ([place]) [place]   . Dementia   . DJD (degenerative joint disease) of lumbar spine   . Hypercholesteremia   . Hypertension   . IDDM (insulin dependent diabetes mellitus) ([place])     Past Surgical History:  Procedure Laterality Date  . APPENDECTOMY    . KNEE SURGERY      Social History   Social History  . Marital status: Widowed    Spouse name: N/A  . Number of children: N/A  . Years of education: N/A   Occupational History  . Retired    Social History Main Topics  . Smoking status: Never Smoker  . Smokeless tobacco: Never Used  . Alcohol use No  . Drug use: No  . Sexual activity: No   Other Topics Concern  . Not on file   Social History Narrative      Lives with daughter     Allergies  Allergen Reactions  . Propoxyphene N-Acetaminophen     Pt does not know reaction  . Simvastatin Other (See Comments)    headache    Family History  Problem Relation Age of Onset  . Heart attack Mother 77  . Heart attack Father 32  . Diabetes Brother        RETINOPATHY   . Drug abuse Brother   . Liver cancer  Brother   . Breast cancer Daughter   . Diabetes Sister     Prior to Admission medications   Medication Sig Start Date End Date Taking? Authorizing Provider  amLODipine (NORVASC) 10 MG tablet TAKE 1 TABLET DAILY 06/08/17  Yes Timmothy Euler, MD  aspirin EC 81 MG EC tablet Take 1 tablet (81 mg total) by mouth daily. 10/08/16  Yes Bhagat, Bhavinkumar, PA  carvedilol (COREG) 6.25 MG tablet TAKE  (1)  TABLET TWICE A DAY. 04/03/17  Yes Timmothy Euler, MD  cholecalciferol (VITAMIN D) 1000 units tablet Take 1,000 Units by mouth daily.   Yes [provider]  cloNIDine (CATAPRES) 0.1 MG tablet TAKE  (1)  TABLET TWICE A DAY. 08/30/17  Yes Timmothy Euler, MD  diclofenac sodium (VOLTAREN) 1 % GEL Apply 4 g topically 4 (four) times daily. Patient taking differently: Apply 4 g topically 4 (four) times daily as needed (FOR  PAIN).  07/29/16  Yes Timmothy Euler, MD  hydrALAZINE (APRESOLINE) 10 MG tablet TAKE  (1)  TABLET  EVERY EIGHT HOURS. Patient taking differently: TAKE  (1)  TABLET  TWICE DAILY 08/30/17  Yes Timmothy Euler, MD  HYDROcodone-acetaminophen (NORCO) 5-325 MG tablet Take 1 tablet by mouth every 6 (six) hours as needed for moderate pain. Patient taking differently: Take 0.5-1 tablets by mouth every 6 (six) hours as needed for moderate pain.  12/08/16  Yes Timmothy Euler, MD  insulin NPH Human (HUMULIN N) 100 UNIT/ML injection Inject 0.1 mLs (10 Units total) into the skin 2 (two) times daily before a meal. Patient taking differently: Inject 10 Units into the skin 2 (two) times daily before a meal. GIVES BASED ON BLOOD SUGAR LEVELS. IF UNDER 120-NO INSULIN IS GIVEN 02/03/17  Yes Timmothy Euler, MD  isosorbide mononitrate (IMDUR) 120 MG 24 hr tablet TAKE 1 TABLET DAILY 07/07/17  Yes Timmothy Euler, MD  levothyroxine (SYNTHROID, LEVOTHROID) 50 MCG tablet TAKE 1 TABLET DAILY 04/03/17  Yes Timmothy Euler, MD  pantoprazole (PROTONIX) 40 MG tablet TAKE 1 TABLET DAILY 07/07/17  Yes Timmothy Euler, MD  polyethylene glycol powder (GLYCOLAX/MIRALAX) powder MIX 1 CAPFUL (17 GRAMS) WITH 8OZ OF WATER OR JUICE DAILY. Patient taking differently: MIX 1 CAPFUL (17 GRAMS) WITH 8OZ OF WATER OR JUICE DAILY AS NEEDED FOR CONSTIPATION 12/13/16  Yes Timmothy Euler, MD  rosuvastatin (CRESTOR) 10 MG tablet TAKE 1 TABLET DAILY Patient taking differently: TAKE 1 TABLET DAILY AT BEDTIME 05/02/17  Yes Timmothy Euler, MD  sodium polystyrene (KAYEXALATE) 15 GM/60ML suspension Take 60 mLs (15 g total) by mouth daily. Patient taking differently: Take 15 g by mouth daily as needed.  03/17/17  Yes Timmothy Euler, MD  sucralfate (CARAFATE) 1 GM/10ML suspension Take 10 mLs (1 g total) by mouth every 6 (six) hours. Patient taking differently: Take 1 g by mouth daily as needed.  03/27/17  Yes Timmothy Euler, MD    torsemide (DEMADEX) 20 MG tablet Take 1 tablet (20 mg total) by mouth daily. 11/08/16  Yes Timmothy Euler, MD  ACCU-CHEK AVIVA PLUS test strip CHECK BLOOD SUGER UP TO 3 TIMES A DAY 09/20/17   Timmothy Euler, MD  Insulin Syringe-Needle U-100 27G X 1/2" 1 ML MISC Use as needed 02/03/17   Timmothy Euler, MD  nitroGLYCERIN (NITROSTAT) 0.4 MG SL tablet PLACE 1 TABLET UNDER THE TONGUE AT ONSET OF CHEST PAIN EVERY 5 MINTUES UP TO 3 TIMES AS NEEDED 02/27/17  Timmothy Euler, MD    Physical Exam: Vitals:   09/20/17 1804 09/20/17 1805 09/20/17 1941  BP:  (!) 196/63 (!) 198/64  Pulse:  82 78  Resp:  16 18  Temp:  98.1 F (36.7 C)   TempSrc:  Oral   SpO2:  99% 100%  Weight: 69.9 kg (154 lb)    Height: 5\' 3"  (1.6 m)       General:  Appears calm and comfortable and is NAD Eyes:   EOMI, normal lids, iris ENT:  grossly normal hearing, lips & tongue, mmm Neck:  no LAD, masses or thyromegaly Cardiovascular:  RRR, no m/r/g.  Respiratory:   Bibasilar crackles.  Normal to minimally increased respiratory effort. Abdomen:  soft, NT, ND, NABS Skin:  no rash or induration seen on limited exam Musculoskeletal:  grossly normal tone BUE/BLE, good ROM, no bony abnormality Lower extremity: Marked LE edema, 3+ bilaterally. Psychiatric:  grossly normal mood and affect, speech fluent and appropriate, AOx3 Neurologic:  CN 2-12 grossly intact, moves all extremities in coordinated fashion, sensation intact    Radiological Exams on Admission: Dg Chest 2 View  Result Date: 09/20/2017 CLINICAL DATA:  Acute chest pain for 2 days and cough for 3 weeks. EXAM: CHEST  2 VIEW COMPARISON:  09/03/2017 and prior radiograph FINDINGS: The cardiomediastinal silhouette is unchanged. Slightly increasing small bilateral pleural effusions and bibasilar atelectasis noted. No pneumothorax.  No interval change identified. No acute bony abnormalities noted. IMPRESSION: Small bilateral pleural effusions and bibasilar  atelectasis, slightly increased from prior study. Electronically Signed   By: Margarette Canada M.D.   On: 09/20/2017 19:03    EKG: Independently reviewed.  NSR with rate 86; 1st degree AV block with nonspecific ST changes    Labs on Admission: I have personally reviewed the available labs and imaging studies at the time of the admission.  Pertinent labs:   K+ 5.4 Glucose 228 BUN 51/Creatinine 3.68/GFR 10 - stable BNP 1243; prior 551.1 on 11/17/16 Troponin 0.01 Lactate 0.9 WBC 3.9 - stable Hgb 9.5 - improved Platelets 100, prior 147 on 10/22 UA: 150 glucose, >300 protein, few bacteria   Assessment/Plan Principal Problem:   Acute on chronic diastolic CHF (congestive heart failure) (HCC) Active Problems:   HTN (hypertension)   Anemia in chronic kidney disease   Insulin dependent diabetes mellitus with complications (HCC)   Chronic kidney disease, stage IV (severe) (HCC)   Thrombocytopenia (HCC)   Acute on chronic diastolic heart failure -Patient without smoking history or prior h/o respiratory failure presenting with progressive chest discomfort, mild SOB without hypoxia, productive cough, and worsening LE edema -Diuretics were decreased to 1/3 of prior dose several months ago due to CKD according to family -Initial concern for PNA since she had it 10/8 -Negative CXR other than mild bilateral pleural effusions -Normal WBC count, negative lactate -Elevated BNP -EKG with ST changes which do not appear to be acute when compared to prior studies; additionally, with chest pain for weeks to months and a negative troponin, ACS seems pretty unlikely -With elevated BNP and symptoms in conjunction with prior Echo in 4/18 showing grade 2 diastolic dysfunction, recurrent diastolic CHF seems most probable as diagnosis -Will place in observation status with telemetry -Will request repeat echocardiogram -Will continue ASA -No ACE-I due to CKD -Continue Coreg -CHF order set  utilized -Cardiology consult in AM -Was given Lasix 40 mg x 1 in ER and will repeat with 40 mg IV BID starting tomorrow AM -Repeat EKG in  AM -Will r/o with serial troponins although doubt ACS based on duration of symptoms  HTN -Discontinue Norvasc due to marked LE edema and CHF -Continue Coreg; Catapres (suggest consideration of Catapres patch instead for ease in dosing); Hydralazine (increase to TID since BID is not very efficacious) -Suspect improvement in BP as patient diureses  DM -A1c 6.1 in 8/18 -Continue NPH -Cover with moderate-scale SSI  CKD -Stage IV but generally stable -Will need to closely monitor this while diuresing the patient -If she develops cardiorenal syndrome, this may become a terminal condition for her  Anemia -Appears to be stable -Likely due to CKD -Will follow  Thrombocytopenia -Uncertain etiology -Appears to be chronic but progressive -Will follow -No Lovenox or NSAIDs for now (including Voltaren gel)   DVT prophylaxis:  SCDs Code Status: DNR - confirmed with patient/family Family Communication: Multiple family members were present throughout the evaluation Disposition Plan:  Home once clinically improved Consults called: Cardiology  Admission status: It is my clinical opinion that referral for OBSERVATION is reasonable and necessary in this patient based on the above information provided. The aforementioned taken together are felt to place the patient at high risk for further clinical deterioration. However it is anticipated that the patient may be medically stable for discharge from the hospital within 24 to 48 hours.  Note: The patient was very resistant to the idea of admission.  She was seated, fully dressed, in the wheelchair throughout my evaluation, since she had refused to get onto the bed or get undressed.  She also did not wish to stay overnight,  But upon explanation and conversation she was willing to agree to stay 1-2  days.   Karmen Bongo MD [facility]  If note is complete, please contact covering daytime or nighttime physician. [email or web address] Password [email or web address]  09/20/2017, 10:05 PM

## 2017-09-20 NOTE — ED Triage Notes (Signed)
Chest pain times 2 days.  Treated for pneumonia 3 weeks ago.

## 2017-09-20 NOTE — ED Provider Notes (Signed)
[facility] EMERGENCY DEPARTMENT Provider Note   CSN: [id number] Arrival date & time: 09/20/17  1756     History   Chief Complaint Chief Complaint  Patient presents with  . Chest Pain    HPI [name] is a 83 y.o. female.  HPI  Pt was seen at 1915.  Per pt and her family, c/o gradual onset and worsening of persistent generalized weakness for the past 2 days. Has been associated with intermittent chest "pains," SOB, cough and increasing pedal edema.  Pt was evaluated by her PMD 2 days ago for her symptoms and "he thought she might have pneumonia again."  Pt's baseline is only standing to pivot from chair to commode; family states she has had difficulty doing this due to her symptoms. Denies palpitations, no cough, no fevers, no abd pain, no N/V/D, no focal motor weakness.   Past Medical History:  Diagnosis Date  . CAD (coronary artery disease) 2003   Last catheterization 2008. Two-vessel PCI of the first OM branch and mid LAD.  Marland Kitchen CKD (chronic kidney disease)   . Congestive heart failure ([place])   . CVA (cerebral vascular accident) ([place]) [place]   . Dementia   . DJD (degenerative joint disease) of lumbar spine   . Hypercholesteremia   . Hypertension   . IDDM (insulin dependent diabetes mellitus) [facility])     Patient Active Problem List   Diagnosis Date Noted  . Abnormal CT scan of lung   . Chest pain 03/04/2017  . Esophagitis   . Occult blood in stools 11/29/2016  . UTI (urinary tract infection) 11/29/2016  . Urinary retention 11/29/2016  . PAF (paroxysmal atrial fibrillation) ([place])   . Pressure injury of skin 11/22/2016  . Aortic valve sclerosis-2D June 2016 10/05/2016  . Chest pain with high risk of acute coronary syndrome 10/05/2016  . 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 Pulmonary hypertension ([place])   . Insulin dependent diabetes mellitus with complications ([place])   . Chronic kidney disease, stage IV (severe) ([place])   . Other constipation   . Failure  to thrive in adult   . Hypomagnesemia   . CAD S/P PCI '03 and '08 05/11/2015  . Anemia in chronic kidney disease 05/11/2015  . Benign paroxysmal positional vertigo 03/21/2014  . HTN (hypertension) 08/09/2013  . Lumbar spondylosis 05/16/2013  . Overweight 05/15/2013  . Varicose veins of lower extremities with other complications 97/67/3419  . Normocytic anemia 10/29/2012  . Hypoglycemia 10/29/2012  . NSVT (nonsustained ventricular tachycardia) ([place]) 10/28/2012  . OA (osteoarthritis) 10/27/2012  . DIASTOLIC HEART FAILURE, CHRONIC 07/20/2010  . Cerebrovascular disease 03/09/2010  . Hyperlipidemia 12/05/2008    Past Surgical History:  Procedure Laterality Date  . APPENDECTOMY    . KNEE SURGERY      OB History    No data available       Home Medications    Prior to Admission medications   Medication Sig Start Date End Date Taking? Authorizing Provider  amLODipine (NORVASC) 10 MG tablet TAKE 1 TABLET DAILY 06/08/17  Yes Timmothy Euler, MD  aspirin EC 81 MG EC tablet Take 1 tablet (81 mg total) by mouth daily. 10/08/16  Yes Bhagat, Bhavinkumar, PA  carvedilol (COREG) 6.25 MG tablet TAKE  (1)  TABLET TWICE A DAY. 04/03/17  Yes Timmothy Euler, MD  cholecalciferol (VITAMIN D) 1000 units tablet Take 1,000 Units by mouth daily.   Yes [provider]  cloNIDine (CATAPRES) 0.1 MG tablet TAKE  (  1)  TABLET TWICE A DAY. 08/30/17  Yes Timmothy Euler, MD  diclofenac sodium (VOLTAREN) 1 % GEL Apply 4 g topically 4 (four) times daily. Patient taking differently: Apply 4 g topically 4 (four) times daily as needed (FOR PAIN).  07/29/16  Yes Timmothy Euler, MD  hydrALAZINE (APRESOLINE) 10 MG tablet TAKE  (1)  TABLET  EVERY EIGHT HOURS. Patient taking differently: TAKE  (1)  TABLET  TWICE DAILY 08/30/17  Yes Timmothy Euler, MD  HYDROcodone-acetaminophen (NORCO) 5-325 MG tablet Take 1 tablet by mouth every 6 (six) hours as needed for moderate pain. Patient taking differently:  Take 0.5-1 tablets by mouth every 6 (six) hours as needed for moderate pain.  12/08/16  Yes Timmothy Euler, MD  insulin NPH Human (HUMULIN N) 100 UNIT/ML injection Inject 0.1 mLs (10 Units total) into the skin 2 (two) times daily before a meal. Patient taking differently: Inject 10 Units into the skin 2 (two) times daily before a meal. GIVES BASED ON BLOOD SUGAR LEVELS. IF UNDER 120-NO INSULIN IS GIVEN 02/03/17  Yes Timmothy Euler, MD  isosorbide mononitrate (IMDUR) 120 MG 24 hr tablet TAKE 1 TABLET DAILY 07/07/17  Yes Timmothy Euler, MD  levothyroxine (SYNTHROID, LEVOTHROID) 50 MCG tablet TAKE 1 TABLET DAILY 04/03/17  Yes Timmothy Euler, MD  nitroGLYCERIN (NITROSTAT) 0.4 MG SL tablet PLACE 1 TABLET UNDER THE TONGUE AT ONSET OF CHEST PAIN EVERY 5 MINTUES UP TO 3 TIMES AS NEEDED 02/27/17  Yes Timmothy Euler, MD  pantoprazole (PROTONIX) 40 MG tablet TAKE 1 TABLET DAILY 07/07/17  Yes Timmothy Euler, MD  polyethylene glycol powder (GLYCOLAX/MIRALAX) powder MIX 1 CAPFUL (17 GRAMS) WITH 8OZ OF WATER OR JUICE DAILY. Patient taking differently: MIX 1 CAPFUL (17 GRAMS) WITH 8OZ OF WATER OR JUICE DAILY AS NEEDED FOR CONSTIPATION 12/13/16  Yes Timmothy Euler, MD  rosuvastatin (CRESTOR) 10 MG tablet TAKE 1 TABLET DAILY Patient taking differently: TAKE 1 TABLET DAILY AT BEDTIME 05/02/17  Yes Timmothy Euler, MD  sodium polystyrene (KAYEXALATE) 15 GM/60ML suspension Take 60 mLs (15 g total) by mouth daily. Patient taking differently: Take 15 g by mouth daily as needed.  03/17/17  Yes Timmothy Euler, MD  sucralfate (CARAFATE) 1 GM/10ML suspension Take 10 mLs (1 g total) by mouth every 6 (six) hours. Patient taking differently: Take 1 g by mouth daily as needed.  03/27/17  Yes Timmothy Euler, MD  torsemide (DEMADEX) 20 MG tablet Take 1 tablet (20 mg total) by mouth daily. 11/08/16  Yes Timmothy Euler, MD  ACCU-CHEK AVIVA PLUS test strip CHECK BLOOD SUGER UP TO 3 TIMES A DAY 09/20/17    Timmothy Euler, MD  amoxicillin-clavulanate (AUGMENTIN) 875-125 MG tablet Take 1 tablet by mouth 2 (two) times daily. 09/04/17   [provider]  Insulin Syringe-Needle U-100 27G X 1/2" 1 ML MISC Use as needed 02/03/17   Timmothy Euler, MD    Family History Family History  Problem Relation Age of Onset  . Heart attack Mother 47  . Heart attack Father 3  . Diabetes Brother        RETINOPATHY   . Drug abuse Brother   . Liver cancer Brother   . Breast cancer Daughter   . Diabetes Sister     Social History Social History  Substance Use Topics  . Smoking status: Never Smoker  . Smokeless tobacco: Never Used  . Alcohol use No  Allergies   Propoxyphene n-acetaminophen and Simvastatin   Review of Systems Review of Systems ROS: Statement: All systems negative except as marked or noted in the HPI; Constitutional: Negative for fever and chills. +generalized weakness/fatigue; ; Eyes: Negative for eye pain, redness and discharge. ; ; ENMT: Negative for ear pain, hoarseness, nasal congestion, sinus pressure and sore throat. ; ; Cardiovascular: Negative for palpitations, diaphoresis, +CP, cough, dyspnea and peripheral edema. ; ; Respiratory: Negative for wheezing and stridor. ; ; Gastrointestinal: Negative for nausea, vomiting, diarrhea, abdominal pain, blood in stool, hematemesis, jaundice and rectal bleeding. . ; ; Genitourinary: Negative for dysuria, flank pain and hematuria. ; ; Musculoskeletal: Negative for back pain and neck pain. Negative for swelling and trauma.; ; Skin: Negative for pruritus, rash, abrasions, blisters, bruising and skin lesion.; ; Neuro: Negative for headache, lightheadedness and neck stiffness. Negative for altered level of consciousness, altered mental status, extremity weakness, paresthesias, involuntary movement, seizure and syncope.       Physical Exam Updated Vital Signs BP (!) 198/64 (BP Location: Left Arm)   Pulse 78   Temp 98.1 F (36.7  C) (Oral)   Resp 18   Ht 5\' 3"  (1.6 m)   Wt 69.9 kg (154 lb)   SpO2 100%   BMI 27.28 kg/m   Physical Exam 1920: Physical examination:  Nursing notes reviewed; Vital signs and O2 SAT reviewed;  Constitutional: Well developed, Well nourished, Well hydrated, In no acute distress; Head:  Normocephalic, atraumatic; Eyes: EOMI, PERRL, No scleral icterus; ENMT: Mouth and pharynx normal, Mucous membranes moist; Neck: Supple, Full range of motion, No lymphadenopathy; Cardiovascular: Regular rate and rhythm, No gallop; Respiratory: Breath sounds coarse & equal bilaterally, scattered wheezes.  Speaking full sentences with ease, Normal respiratory effort/excursion; Chest: Nontender, Movement normal; Abdomen: Soft, Nontender, Nondistended, Normal bowel sounds; Genitourinary: No CVA tenderness; Extremities: Pulses normal, No tenderness, +2 pedal edema RLE, +1 pedal edema LLE..; Neuro: AA&Ox3, Major CN grossly intact.  Speech clear. No gross focal motor deficits in extremities.; Skin: Color normal, Warm, Dry.   ED Treatments / Results  Labs (all labs ordered are listed, but only abnormal results are displayed)   EKG  EKG Interpretation  Date/Time:  Wednesday September 20 2017 18:02:10 EDT Ventricular Rate:  86 PR Interval:  242 QRS Duration: 112 QT Interval:  376 QTC Calculation: 449 R Axis:   36 Text Interpretation:  Sinus rhythm with 1st degree A-V block Septal infarct , age undetermined Marked ST abnormality, possible inferior subendocardial injury When compared with ECG of 03/07/2017 ST-t wave abnormality is now Present Confirmed by Francine Graven [phone]) on 09/20/2017 8:52:33 PM       Radiology   Procedures Procedures (including critical care time)  Medications Ordered in ED Medications  furosemide (LASIX) injection 40 mg (not administered)     Initial Impression / Assessment and Plan / ED Course  I have reviewed the triage vital signs and the nursing notes.  Pertinent labs &  imaging results that were available during my care of the patient were reviewed by me and considered in my medical decision making (see chart for details).   MDM Reviewed: previous chart, nursing note and vitals Reviewed previous: labs and ECG Interpretation: labs, ECG and x-ray   Results for orders placed or performed during the hospital encounter of 09/13/50  Basic metabolic panel  Result Value Ref Range   Sodium 140 135 - 145 mmol/L   Potassium 5.4 (H) 3.5 - 5.1 mmol/L   Chloride 109 101 -  111 mmol/L   CO2 21 (L) 22 - 32 mmol/L   Glucose, Bld 228 (H) 65 - 99 mg/dL   BUN 51 (H) 6 - 20 mg/dL   Creatinine, Ser 3.68 (H) 0.44 - 1.00 mg/dL   Calcium 9.8 8.9 - 10.3 mg/dL   GFR calc non Af Amer 10 (L) >60 mL/min   GFR calc Af Amer 12 (L) >60 mL/min   Anion gap 10 5 - 15  Brain natriuretic peptide  Result Value Ref Range   B Natriuretic Peptide 1,243.0 (H) 0.0 - 100.0 pg/mL  Lactic acid, plasma  Result Value Ref Range   Lactic Acid, Venous 0.9 0.5 - 1.9 mmol/L  CBC with Differential  Result Value Ref Range   WBC 3.9 (L) 4.0 - 10.5 K/uL   RBC 3.26 (L) 3.87 - 5.11 MIL/uL   Hemoglobin 9.5 (L) 12.0 - 15.0 g/dL   HCT 30.6 (L) 36.0 - 46.0 %   MCV 93.9 78.0 - 100.0 fL   MCH 29.1 26.0 - 34.0 pg   MCHC 31.0 30.0 - 36.0 g/dL   RDW 14.7 11.5 - 15.5 %   Platelets 100 (L) 150 - 400 K/uL   Neutrophils Relative % 68 %   Neutro Abs 2.7 1.7 - 7.7 K/uL   Lymphocytes Relative 22 %   Lymphs Abs 0.9 0.7 - 4.0 K/uL   Monocytes Relative 8 %   Monocytes Absolute 0.3 0.1 - 1.0 K/uL   Eosinophils Relative 2 %   Eosinophils Absolute 0.1 0.0 - 0.7 K/uL   Basophils Relative 1 %   Basophils Absolute 0.0 0.0 - 0.1 K/uL  Urinalysis, Routine w reflex microscopic  Result Value Ref Range   Color, Urine YELLOW YELLOW   APPearance HAZY (A) CLEAR   Specific Gravity, Urine 1.014 1.005 - 1.030   pH 6.0 5.0 - 8.0   Glucose, UA 150 (A) NEGATIVE mg/dL   Hgb urine dipstick NEGATIVE NEGATIVE   Bilirubin Urine  NEGATIVE NEGATIVE   Ketones, ur NEGATIVE NEGATIVE mg/dL   Protein, ur >=300 (A) NEGATIVE mg/dL   Nitrite NEGATIVE NEGATIVE   Leukocytes, UA NEGATIVE NEGATIVE   RBC / HPF 0-5 0 - 5 RBC/hpf   WBC, UA 6-30 0 - 5 WBC/hpf   Bacteria, UA FEW (A) NONE SEEN   Squamous Epithelial / LPF 6-30 (A) NONE SEEN  POCT i-Stat troponin I  Result Value Ref Range   Troponin i, poc 0.01 0.00 - 0.08 ng/mL   Comment 3           Dg Chest 2 View Result Date: 09/20/2017 CLINICAL DATA:  Acute chest pain for 2 days and cough for 3 weeks. EXAM: CHEST  2 VIEW COMPARISON:  09/03/2017 and prior radiograph FINDINGS: The cardiomediastinal silhouette is unchanged. Slightly increasing small bilateral pleural effusions and bibasilar atelectasis noted. No pneumothorax.  No interval change identified. No acute bony abnormalities noted. IMPRESSION: Small bilateral pleural effusions and bibasilar atelectasis, slightly increased from prior study. Electronically Signed   By: Margarette Canada M.D.   On: 09/20/2017 19:03   Results for ATISHA, HAMIDI (MRN [id number]) as of 09/20/2017 21:09  Ref. Range 04/18/2017 14:43 05/04/2017 16:09 07/18/2017 13:30 09/03/2017 23:37 09/20/2017 19:06  BUN Latest Ref Range: 6 - 20 mg/dL 37 (H) 44 (H) 35 (H) 53 (H) 51 (H)  Creatinine Latest Ref Range: 0.44 - 1.00 mg/dL 2.32 (H) 3.59 (>) 2.83 (H) 3.76 (H) 3.68 (H)    Results for MARISA, HAGE (MRN [id number]) as of 09/20/2017  21:09  Ref. Range 08/22/2017 14:22 09/03/2017 23:37 09/18/2017 16:32 09/19/2017 13:04 09/20/2017 19:10  Hemoglobin Latest Ref Range: 12.0 - 15.0 g/dL 9.6 (L) 8.8 (L) 9.0 (L) 9.0 (L) 9.5 (L)  HCT Latest Ref Range: 36.0 - 46.0 %  27.0 (L) 27.3 (L)  30.6 (L)  Platelets Latest Ref Range: 150 - 400 K/uL  169 147 (L)  100 (L)    Results for OLUWATOMISIN, HUSTEAD (MRN [id number]) as of 09/20/2017 21:09  Ref. Range 05/09/2015 21:12 10/05/2016 10:52 11/17/2016 09:00 09/20/2017 19:40  B Natriuretic Peptide Latest Ref Range: 0.0 - 100.0 pg/mL 842.0 (H)  411.8 (H) 551.1 (H) 1,243.0 (H)    2055:  Bilateral pleural effusions on CXR with BNP elevated from previous; IV lasix given. EKG with NS STTW changes inferior and lateral leads compared to immediate previous EKG, but pt has had multiple previous EKG's with similar STTW changes. BUN/Cr, H/H per baseline.  T/C to [facility] Dr. Lorin Mercy, case discussed, including:  HPI, pertinent PM/SHx, VS/PE, dx testing, ED course and treatment:  Agreeable to admit.     Final Clinical Impressions(s) / ED Diagnoses   Final diagnoses:  Chest pain, unspecified type  Acute on chronic congestive heart failure, unspecified heart failure type ([place])  Anemia, unspecified type  Thrombocytopenia [facility])    New Prescriptions New Prescriptions   No medications on file     Francine Graven, DO 09/23/17 [id number]

## 2017-09-21 ENCOUNTER — Observation Stay (HOSPITAL_BASED_OUTPATIENT_CLINIC_OR_DEPARTMENT_OTHER): Payer: Medicare Other

## 2017-09-21 ENCOUNTER — Observation Stay (HOSPITAL_COMMUNITY): Payer: Medicare Other

## 2017-09-21 ENCOUNTER — Other Ambulatory Visit: Payer: Self-pay

## 2017-09-21 DIAGNOSIS — D696 Thrombocytopenia, unspecified: Secondary | ICD-10-CM

## 2017-09-21 DIAGNOSIS — Z794 Long term (current) use of insulin: Secondary | ICD-10-CM | POA: Diagnosis not present

## 2017-09-21 DIAGNOSIS — I340 Nonrheumatic mitral (valve) insufficiency: Secondary | ICD-10-CM

## 2017-09-21 DIAGNOSIS — N179 Acute kidney failure, unspecified: Secondary | ICD-10-CM | POA: Diagnosis not present

## 2017-09-21 DIAGNOSIS — D631 Anemia in chronic kidney disease: Secondary | ICD-10-CM | POA: Diagnosis not present

## 2017-09-21 DIAGNOSIS — N184 Chronic kidney disease, stage 4 (severe): Secondary | ICD-10-CM

## 2017-09-21 DIAGNOSIS — E118 Type 2 diabetes mellitus with unspecified complications: Secondary | ICD-10-CM

## 2017-09-21 DIAGNOSIS — R600 Localized edema: Secondary | ICD-10-CM | POA: Diagnosis not present

## 2017-09-21 DIAGNOSIS — J90 Pleural effusion, not elsewhere classified: Secondary | ICD-10-CM | POA: Diagnosis not present

## 2017-09-21 DIAGNOSIS — R079 Chest pain, unspecified: Secondary | ICD-10-CM | POA: Diagnosis not present

## 2017-09-21 DIAGNOSIS — I509 Heart failure, unspecified: Secondary | ICD-10-CM | POA: Diagnosis not present

## 2017-09-21 DIAGNOSIS — I2510 Atherosclerotic heart disease of native coronary artery without angina pectoris: Secondary | ICD-10-CM

## 2017-09-21 DIAGNOSIS — I5033 Acute on chronic diastolic (congestive) heart failure: Secondary | ICD-10-CM | POA: Diagnosis not present

## 2017-09-21 DIAGNOSIS — N039 Chronic nephritic syndrome with unspecified morphologic changes: Secondary | ICD-10-CM | POA: Diagnosis not present

## 2017-09-21 LAB — CBC WITH DIFFERENTIAL/PLATELET
BASOS ABS: 0 10*3/uL (ref 0.0–0.1)
BASOS PCT: 0 %
Eosinophils Absolute: 0.1 10*3/uL (ref 0.0–0.7)
Eosinophils Relative: 3 %
HEMATOCRIT: 28.4 % — AB (ref 36.0–46.0)
HEMOGLOBIN: 9 g/dL — AB (ref 12.0–15.0)
LYMPHS PCT: 35 %
Lymphs Abs: 0.9 10*3/uL (ref 0.7–4.0)
MCH: 29.5 pg (ref 26.0–34.0)
MCHC: 31.7 g/dL (ref 30.0–36.0)
MCV: 93.1 fL (ref 78.0–100.0)
Monocytes Absolute: 0.4 10*3/uL (ref 0.1–1.0)
Monocytes Relative: 13 %
NEUTROS ABS: 1.3 10*3/uL — AB (ref 1.7–7.7)
NEUTROS PCT: 49 %
Platelets: 128 10*3/uL — ABNORMAL LOW (ref 150–400)
RBC: 3.05 MIL/uL — AB (ref 3.87–5.11)
RDW: 14.7 % (ref 11.5–15.5)
WBC: 2.7 10*3/uL — ABNORMAL LOW (ref 4.0–10.5)

## 2017-09-21 LAB — GLUCOSE, CAPILLARY
GLUCOSE-CAPILLARY: 93 mg/dL (ref 65–99)
GLUCOSE-CAPILLARY: 100 mg/dL — AB (ref 65–99)
GLUCOSE-CAPILLARY: 79 mg/dL (ref 65–99)
Glucose-Capillary: 55 mg/dL — ABNORMAL LOW (ref 65–99)
Glucose-Capillary: 90 mg/dL (ref 65–99)
Glucose-Capillary: 88 mg/dL (ref 65–99)

## 2017-09-21 LAB — HEPATIC FUNCTION PANEL
ALBUMIN: 3.5 g/dL (ref 3.5–5.0)
ALK PHOS: 41 U/L (ref 38–126)
ALT: 8 U/L — AB (ref 14–54)
AST: 10 U/L — AB (ref 15–41)
BILIRUBIN TOTAL: 0.5 mg/dL (ref 0.3–1.2)
Total Protein: 6.6 g/dL (ref 6.5–8.1)

## 2017-09-21 LAB — BASIC METABOLIC PANEL
Anion gap: 8 (ref 5–15)
BUN: 52 mg/dL — ABNORMAL HIGH (ref 6–20)
CHLORIDE: 108 mmol/L (ref 101–111)
CO2: 24 mmol/L (ref 22–32)
Calcium: 9.8 mg/dL (ref 8.9–10.3)
Creatinine, Ser: 3.55 mg/dL — ABNORMAL HIGH (ref 0.44–1.00)
GFR, EST AFRICAN AMERICAN: 13 mL/min — AB (ref >60)
GFR, EST NON AFRICAN AMERICAN: 11 mL/min — AB (ref >60)
Glucose, Bld: 101 mg/dL — ABNORMAL HIGH (ref 65–99)
POTASSIUM: 4.8 mmol/L (ref 3.5–5.1)
SODIUM: 140 mmol/L (ref 135–145)

## 2017-09-21 LAB — ECHOCARDIOGRAM COMPLETE
HEIGHTINCHES: 63 in
Weight: 2425.6 oz

## 2017-09-21 LAB — TROPONIN I: Troponin I: <0.03 ng/mL (ref <0.03)

## 2017-09-21 LAB — VITAMIN B12: VITAMIN B 12: 395 pg/mL (ref 180–914)

## 2017-09-21 MED ORDER — INSULIN NPH (HUMAN) (ISOPHANE) 100 UNIT/ML ~~LOC~~ SUSP
5.0000 [IU] | Freq: Two times a day (BID) | SUBCUTANEOUS | Status: DC
  Administered 2017-09-21 – 2017-09-24 (×4): 5 [IU] via SUBCUTANEOUS
  Filled 2017-09-21: qty 10

## 2017-09-21 MED ORDER — INSULIN ASPART 100 UNIT/ML ~~LOC~~ SOLN
0.0000 [IU] | Freq: Three times a day (TID) | SUBCUTANEOUS | Status: DC
  Administered 2017-09-22 – 2017-09-23 (×2): 2 [IU] via SUBCUTANEOUS
  Administered 2017-09-23: 3 [IU] via SUBCUTANEOUS
  Administered 2017-09-24: 2 [IU] via SUBCUTANEOUS

## 2017-09-21 MED ORDER — HYDRALAZINE HCL 25 MG PO TABS
25.0000 mg | ORAL_TABLET | Freq: Two times a day (BID) | ORAL | Status: DC
  Administered 2017-09-21 – 2017-09-23 (×5): 25 mg via ORAL
  Filled 2017-09-21 (×6): qty 1

## 2017-09-21 MED ORDER — INSULIN ASPART 100 UNIT/ML ~~LOC~~ SOLN: 0.0000 [IU] | Freq: Every day | SUBCUTANEOUS | Status: DC

## 2017-09-21 MED ORDER — CARVEDILOL 3.125 MG PO TABS
9.3750 mg | ORAL_TABLET | Freq: Two times a day (BID) | ORAL | Status: DC
  Administered 2017-09-21 – 2017-09-22 (×3): 9.375 mg via ORAL
  Filled 2017-09-21 (×3): qty 3

## 2017-09-21 NOTE — Evaluation (Signed)
Physical Therapy Evaluation Patient Details Name: [name] MRN: [id number] DOB: 07-14-1932 Today's Date: 09/21/2017   History of Present Illness  [name] is a 83 y.o. female with medical history significant of DM; HTN; HLD; dementia; grade 2 diastolic dysfunction on Echo I 4/18; stage IV CKD; and CAD presenting with "Just hurting".  She went to the doctor Monday with complaint of ongoing cough after PNA diagnosed on 10/8; she also had LE edema at that visit but did not apparently mention the chest pain (there is no mention in his note).  She says that her chest has been bothering her and she tried to get him to give her medicine for it.  She has also had thick white stuff coming up from her throat.  Symptoms have been ongoing for about 2 months.  No SOB.  She sleeps in a recliner so no lying flat.  No PND.  She does report some trouble breathing for the last couple of months.  Substernal chest pain for about 2 months.  It comes and goes and "in a way" it has gotten worse.  +LE edema for weeks.  She was previously taking 3 torsemide daily and it was making her renal failure worse so they decreased it to once daily several months ago.  In [name], they talked to him about changing it back but Dr. [name] didn't want her to.    Clinical Impression  [name] [name] is an 83 y.o. female presenting with generalized weakness, R>L, and impaired balance secondary to previous L CVA. Upon PT arrival patient was received in bedside recliner and was agreeable to therapy. She is currently requiring Minimal/Contact Guard Assist with a front wheeled walker for stand step transfers and short distance of ambulation. [name] states she is primarily sedentary at baseline and uses a front wheeled walker at home to transfer from her chair to bedside commode. [name] will benefit from ongoing Inpatient PT to address strength and balcne deficits to improve functional mobility and prevent secondary  complications throughout length of stay. Anticipate [name] will be safe to discharge home when medically stable as she has supportive daughter who is able to provide assistance.    Follow Up Recommendations No PT follow up;Supervision/Assistance - 24 hour    Equipment Recommendations   (patient has all neccessary DME)    Recommendations for Other Services       Precautions / Restrictions Restrictions Weight Bearing Restrictions: No      Mobility  Bed Mobility Overal bed mobility: Modified Independent Bed Mobility: Sit to Supine       Sit to supine: Supervision   General bed mobility comments: patient requires extra time due to overall waekness, no cues needed for sequencing  Transfers Overall transfer level: Needs assistance Equipment used: Rolling walker (2 wheeled) Transfers: Sit to/from [facility] Sit to Stand: Min guard Stand pivot transfers: Min guard       General transfer comment: min/contact guard assist, no cues needed for safety with walker  Ambulation/Gait Ambulation/Gait assistance: Min guard Ambulation Distance (Feet): 4 Feet Assistive device: Rolling walker (2 wheeled)     Gait velocity interpretation: Below normal speed for age/gender General Gait Details: patient with decreased gati velocity, step to pattern with LLE secondary to decreased weight shift right with decresased stance time on RLE and decreased step length on LLE, decreased foot clearance on RLE     Balance Overall balance assessment: Needs assistance Sitting-balance support: Feet supported;No upper extremity  supported Sitting balance-Leahy Scale: Good Sitting balance - Comments: patient sit edge of bed with sueprvision   Standing balance support: Bilateral upper extremity supported Standing balance-Leahy Scale: Good Standing balance comment: min/contact guard assist to maintain static standing balance with front wheeled walker         Pertinent  Vitals/Pain Pain Assessment: 0-10 Pain Score: 7  Pain Location: bladder region Pain Descriptors / Indicators: Grimacing;Discomfort Pain Intervention(s): Other (comment) (RN gave pain medication prior to session)    [name] expects to be discharged to:: Private residence Living Arrangements: Children Available Help at Discharge: Family Type of Home: [place]: [place]: Environmental consultant - 2 wheels;Bedside commode Additional Comments: Patient lives with daughter who is available 24/7 and physically able to provide assitance, 1-2 steps with no rails to get to bedroom, patient is unable to use stairs at basline and sleeps in recliner in living room on main floor, uses BSC next to recliner    Prior Function Level of Independence: Needs assistance   Gait / Transfers Assistance Needed: uses front wheeled walker for short distances in home and stand pivot transfer  ADL's / Homemaking Assistance Needed: patients daughter performs         Hand Dominance        Extremity/Trunk Assessment        Lower Extremity Assessment Lower Extremity Assessment: Generalized weakness;RLE deficits/detail RLE Deficits / Details: RLE grossly 3+/5 based on functinoal performance LLE Deficits / Details: LLE grossly 4/5 based on functional performance    Cervical / Trunk Assessment Cervical / Trunk Assessment: Kyphotic  Communication   Communication: No difficulties  Cognition Arousal/Alertness: Awake/alert Behavior During Therapy: WFL for tasks assessed/performed Overall Cognitive Status: Within Functional Limits for tasks assessed    General Comments      Exercises General Exercises - Lower Extremity Ankle Circles/Pumps: AROM;Both;10 reps Gluteal Sets: Supine;5 reps;Both;AROM (glute raises )   Assessment/Plan    PT Assessment Patient needs continued PT services  PT Problem List Decreased strength;Decreased activity tolerance;Decreased  mobility;Decreased skin integrity;Decreased balance       PT Treatment Interventions Functional mobility training;Therapeutic exercise;Therapeutic activities;Gait training;Patient/family education;DME instruction    PT Goals (Current goals can be found in the Care Plan section)  Acute Rehab PT Goals Patient Stated Goal: I'd like to go home with less pain  PT Goal Formulation: With patient Potential to Achieve Goals: Good    Frequency Min 3X/week   Barriers to discharge         AM-PAC PT "6 Clicks" Daily Activity  Outcome Measure Difficulty turning over in bed (including adjusting bedclothes, sheets and blankets)?: A Lot Difficulty moving from lying on back to sitting on the side of the bed? : A Lot Difficulty sitting down on and standing up from a chair with arms (e.g., wheelchair, bedside commode, etc,.)?: Unable Help needed moving to and from a bed to chair (including a wheelchair)?: A Little Help needed walking in hospital room?: A Little Help needed climbing 3-5 steps with a railing? : Total 6 Click Score: 12    End of Session Equipment Utilized During Treatment:  (front wheeled walker) Activity Tolerance: Patient tolerated treatment well;Patient limited by pain (RN provided pain medication prior to therapy, positioning minimally reduced pain) Patient left: in bed;with call bell/phone within reach Nurse Communication: Mobility status;Other (comment) (notified of pain and of patient location) PT Visit Diagnosis: Muscle weakness (generalized) (M62.81);Other (comment);Hemiplegia and hemiparesis (right hemiparesis secondary to  prior L CVa) Hemiplegia - Right/Left: Right    Time: 0935-1010 PT Time Calculation (min) (ACUTE ONLY): 35 min   Charges:   PT Evaluation $PT Eval Low Complexity: 1 Low PT Treatments $Therapeutic Activity: 23-37 mins   PT G Codes:   PT G-Codes **NOT FOR INPATIENT CLASS** Functional Assessment Tool Used: AM-PAC 6 Clicks Basic Mobility Functional  Limitation: Mobility: Walking and moving around;Self care Mobility: Walking and Moving Around Current Status (B2841): At least 60 percent but less than 80 percent impaired, limited or restricted Mobility: Walking and Moving Around Goal Status [phone]): At least 60 percent but less than 80 percent impaired, limited or restricted Mobility: Walking and Moving Around Discharge Status [phone]): At least 60 percent but less than 80 percent impaired, limited or restricted Self Care Current Status (Z3664): At least 60 percent but less than 80 percent impaired, limited or restricted Self Care Goal Status (Q0347): At least 60 percent but less than 80 percent impaired, limited or restricted Self Care Discharge Status [phone]): At least 60 percent but less than 80 percent impaired, limited or restricted     Debara Pickett, PT, DPT Physical Therapist with [facility]  09/21/2017, 11:00 AM

## 2017-09-21 NOTE — Evaluation (Signed)
Occupational Therapy Evaluation Patient Details Name: [name] MRN: [id number] DOB: 25-Oct-1932 Today's Date: 09/21/2017    History of Present Illness [name] is a 83 y.o. female with medical history significant of DM; HTN; HLD; dementia; grade 2 diastolic dysfunction on Echo I 4/18; stage IV CKD; and CAD presenting with "Just hurting".  She went to the doctor Monday with complaint of ongoing cough after PNA diagnosed on 10/8; she also had LE edema at that visit but did not apparently mention the chest pain (there is no mention in his note).  She says that her chest has been bothering her and she tried to get him to give her medicine for it.  She has also had thick white stuff coming up from her throat.  Symptoms have been ongoing for about 2 months.  No SOB.  She sleeps in a recliner so no lying flat.  No PND.  She does report some trouble breathing for the last couple of months.  Substernal chest pain for about 2 months.  It comes and goes and "in a way" it has gotten worse.  +LE edema for weeks.   Clinical Impression   Pt received supine in bed, agreeable to OT evaluation. Pt reports daughter assists as needed with ADL completion, mostly set-up and supervision for tasks. Pt is sedentary, only moving from bed to chair to bathroom (or BSC). During evaluation pt demonstrates generalized weakness of BUE, right weakness>left; is completing seated tasks with set-up, min guard for functional mobility and standing tasks. Pt appears to be at baseline with ADL completion, no further OT services required at this time.     Follow Up Recommendations  No OT follow up;Supervision/Assistance - 24 hour    Equipment Recommendations  None recommended by OT       Precautions / Restrictions Precautions Precautions: Fall Precaution Comments: fell approximately 3-4 weeks ago Restrictions Weight Bearing Restrictions: No      Mobility Bed Mobility Overal bed mobility: Needs Assistance Bed  Mobility: Supine to Sit     Supine to sit: Supervision;HOB elevated        Transfers Overall transfer level: Needs assistance Equipment used: Rolling walker (2 wheeled) Transfers: Sit to/from [facility] Sit to Stand: Min guard Stand pivot transfers: Min guard                ADL either performed or assessed with clinical judgement   ADL Overall ADL's : Needs assistance/impaired;At baseline     Grooming: Set up;Wash/dry hands;Sitting           Upper Body Dressing : Minimal assistance;Sitting Upper Body Dressing Details (indicate cue type and reason): Assistance for managing buttons/ties on gown Lower Body Dressing: Moderate assistance;Sitting/lateral leans Lower Body Dressing Details (indicate cue type and reason): Assistance for donning socks/shoes Toilet Transfer: Min guard;Stand-pivot;BSC   Toileting- Clothing Manipulation and Hygiene: Supervision/safety;Sitting/lateral lean       Functional mobility during ADLs: Min guard;Rolling walker       Vision Baseline Vision/History: No visual deficits Patient Visual Report: No change from baseline Vision Assessment?: No apparent visual deficits            Pertinent Vitals/Pain Pain Assessment: No/denies pain     Hand Dominance Right   Extremity/Trunk Assessment Upper Extremity Assessment Upper Extremity Assessment: Generalized weakness;RUE deficits/detail;LUE deficits/detail RUE Deficits / Details: ROM <50%; strength 3-/5. Pt reports this is "normal" for her.  LUE Deficits / Details: ROM grossly 50-60%; strength 3+/5   Lower Extremity  Assessment Lower Extremity Assessment: Defer to PT evaluation   Cervical / Trunk Assessment Cervical / Trunk Assessment: Kyphotic   Communication Communication Communication: No difficulties   Cognition Arousal/Alertness: Awake/alert Behavior During Therapy: WFL for tasks assessed/performed Overall Cognitive Status: Within Functional Limits for tasks  assessed                                                Home Living Family/patient expects to be discharged to:: Private residence Living Arrangements: Children (lives with daughter) Available Help at Discharge: Family;Available PRN/intermittently (daughter works 2 hrs/day as Quarry manager) Type of Home: House Home Access: Level entry     [place]: One level     Bathroom Shower/Tub: Teacher, early years/pre: [place]: Environmental consultant - 2 wheels;Bedside commode          Prior Functioning/Environment Level of Independence: Needs assistance  Gait / Transfers Assistance Needed: Uses RW for functional mobility; daughter is with pt when ambulating ADL's / Homemaking Assistance Needed: Daughter assists with set-up for dressing and bathing.             OT Problem List: Decreased activity tolerance;Decreased strength;Impaired balance (sitting and/or standing)       AM-PAC PT "6 Clicks" Daily Activity     Outcome Measure Help from another person eating meals?: None Help from another person taking care of personal grooming?: A Little Help from another person toileting, which includes using toliet, bedpan, or urinal?: A Little Help from another person bathing (including washing, rinsing, drying)?: A Little Help from another person to put on and taking off regular upper body clothing?: A Little Help from another person to put on and taking off regular lower body clothing?: A Lot 6 Click Score: 18   End of Session Equipment Utilized During Treatment: Gait belt;Rolling walker Nurse Communication: Mobility status  Activity Tolerance: Patient tolerated treatment well Patient left: in chair;with call bell/phone within reach  OT Visit Diagnosis: Muscle weakness (generalized) (M62.81)                Time: 2353-6144 OT Time Calculation (min): 30 min Charges:  OT General Charges $OT Visit: 1 Visit OT Evaluation $OT Eval Low Complexity: 1  Low G-Codes: OT G-codes **NOT FOR INPATIENT CLASS** Functional Assessment Tool Used: AM-PAC 6 Clicks Daily Activity Functional Limitation: Self care Self Care Current Status (R1540): At least 40 percent but less than 60 percent impaired, limited or restricted Self Care Goal Status (G8676): At least 40 percent but less than 60 percent impaired, limited or restricted Self Care Discharge Status [phone]): At least 40 percent but less than 60 percent impaired, limited or restricted    Guadelupe Sabin, OTR/L  [phone] 09/21/2017, 8:18 AM

## 2017-09-21 NOTE — Progress Notes (Signed)
*  PRELIMINARY RESULTS* Echocardiogram 2D Echocardiogram has been performed.  Leavy Cella 09/21/2017, 1:29 PM

## 2017-09-21 NOTE — Care Management (Signed)
Spoke with daughter at bedside, she requests Hospital bed and bedside table. Patient has been sleeping in a recliner. MD notified. Vaughan Basta of [facility] notified and will obtain orders from chart and call Veranda-daughter [phone].

## 2017-09-21 NOTE — Plan of Care (Deleted)
Problem: Acute Rehab PT Goals(only PT should resolve) Goal: Patient Will Transfer Sit To/From Stand Outcome: Progressing With front wheeled walker from edge of bed and bedside chair Goal: Pt Will Transfer Bed To Chair/Chair To Bed Outcome: Progressing With front wheeled walker

## 2017-09-21 NOTE — Progress Notes (Signed)
PROGRESS NOTE  CORRINE [name] CBJ:[id number] DOB: 25-Feb-1932 DOA: 09/20/2017 PCP: Timmothy Euler, MD  Brief History:  83 year old female with a history of diabetes mellitus, essential hypertension, CKD stage IV, coronary artery disease, hyperlipidemia presenting with 4-day history of worsening shortness of breath.  The patient states that she has been struggling with intermittent chest discomfort and shortness of breath for the past 1-2 months.  The patient had an emergency department visit on September 03, 2017 when she was diagnosed with pneumonia and sent home with Augmentin and doxycycline.  She only had minimal improvement, and continues to have intermittent shortness of breath and chest discomfort which has worsened over the past 4 days.  She went to see her primary care provider on September 18, 2017.  There was concern at that time for increased leg edema.  In addition, the patient's daughter relates that she had her torsemide dose decreased approximately 6 months ago when she previously took 60 mg daily.  She has been on torsemide 20 mg daily since that period of time.  Upon admission, the patient had a chest x-ray which showed increased bilateral pleural effusions with BNP 1243.  The patient was started on intravenous furosemide.  Assessment/Plan: Acute on chronic diastolic CHF -Suspect uncontrolled hypertension may be playing a role -Continue furosemide 40 mg IV twice daily -Review of the record shows that her dry weight is 140-142 -Daily weights -Strict I's and Os -Repeat echocardiogram -personally reviewed CXR--bilateral pleural effusions  CKD stage IV -Suspect patient has had continued progression of her CKD -Previous baseline 2.8-3.0 -The patient will need to tolerate worsening renal function for improved respiratory status -She is a poor dialysis candidate -Monitor renal function with diuresis  Essential hypertension, poorly controlled -Continue Imdur -Increase  hydralazine to 25 mg twice daily  -Increase carvedilol to 9.375 bid -Anticipate improvement with diuresis  Thrombocytopenia  -likely due to chronic hepatic congestion from CHF -TSH -Serum B12  Coronary artery disease/atypical chest pain -Troponins negative x3 -Echocardiogram -Personally reviewed EKG--sinus, IVCD, LVH changes, nonspecific ST-T changes  Leg Edema/pain -venous duplex  Diabetes mellitus type 2, controlled -July 18, 2017 hemoglobin A1c 6.1 -NovoLog sliding scale -check A1C  Hyperlipidemia -Continue statin     Disposition Plan:   Home in 2-3 days  Family Communication:   Family at bedside  Consultants:    Code Status:  FULL / DNR  DVT Prophylaxis:  [place] Heparin / [place] Lovenox   Procedures: As Listed in Progress Note Above  Antibiotics: None    Subjective:   Objective: Vitals:   09/20/17 1945 09/20/17 2226 09/21/17 0500 09/21/17 0518  BP:    (!) 179/60  Pulse: 78   79  Resp: 16   18  Temp:    98.2 F (36.8 C)  TempSrc:    Oral  SpO2: 98%   99%  Weight:  68.5 kg (151 lb 1.6 oz) 68.8 kg (151 lb 9.6 oz)   Height:       No intake or output data in the 24 hours ending 09/21/17 0740 Weight change:  Exam:   General:  Pt is alert, follows commands appropriately, not in acute distress  HEENT: No icterus, No thrush, No neck mass, [place]/AT  Cardiovascular: RRR, S1/S2, no rubs, no gallops  Respiratory: CTA bilaterally, no wheezing, no crackles, no rhonchi  Abdomen: Soft/+BS, non tender, non distended, no guarding  Extremities: No edema, No lymphangitis, No petechiae, No rashes, no synovitis  Data Reviewed: I have personally reviewed following labs and imaging studies Basic Metabolic Panel:  Recent Labs Lab 09/20/17 1906 09/21/17 0424  NA 140 140  K 5.4* 4.8  CL 109 108  CO2 21* 24  GLUCOSE 228* 101*  BUN 51* 52*  CREATININE 3.68* 3.55*  CALCIUM 9.8 9.8   Liver Function Tests: No results for input(s): AST, ALT, ALKPHOS,  BILITOT, PROT, ALBUMIN in the last 168 hours. No results for input(s): LIPASE, AMYLASE in the last 168 hours. No results for input(s): AMMONIA in the last 168 hours. Coagulation Profile: No results for input(s): INR, PROTIME in the last 168 hours. CBC:  Recent Labs Lab 09/18/17 1632 09/19/17 1304 09/20/17 1910 09/21/17 0424  WBC 3.2*  --  3.9* 2.7*  NEUTROABS 1.8  --  2.7 1.3*  HGB 9.0* 9.0* 9.5* 9.0*  HCT 27.3*  --  30.6* 28.4*  MCV 89  --  93.9 93.1  PLT 147*  --  100* 128*   Cardiac Enzymes:  Recent Labs Lab 09/20/17 2244 09/21/17 0424  TROPONINI <0.03 <0.03   BNP: Invalid input(s): POCBNP CBG:  Recent Labs Lab 09/20/17 2234  GLUCAP 193*   HbA1C: No results for input(s): HGBA1C in the last 72 hours. Urine analysis:    Component Value Date/Time   COLORURINE YELLOW 09/20/2017 1935   APPEARANCEUR HAZY (A) 09/20/2017 1935   APPEARANCEUR Cloudy (A) 07/18/2017 1338   LABSPEC 1.014 09/20/2017 1935   PHURINE 6.0 09/20/2017 1935   GLUCOSEU 150 (A) 09/20/2017 1935   HGBUR NEGATIVE 09/20/2017 [place] NEGATIVE 09/20/2017 1935   BILIRUBINUR Negative 07/18/2017 [place] 09/20/2017 1935   PROTEINUR >=300 (A) 09/20/2017 1935   UROBILINOGEN negative 07/02/2015 1505   UROBILINOGEN 0.2 05/10/2015 1245   NITRITE NEGATIVE 09/20/2017 1935   LEUKOCYTESUR NEGATIVE 09/20/2017 1935   LEUKOCYTESUR 3+ (A) 07/18/2017 1338   Sepsis Labs: @LABRCNTIP (procalcitonin:4,lacticidven:4) )No results found for this or any previous visit (from the past 240 hour(s)).   Scheduled Meds: . aspirin  81 mg Oral Daily  . carvedilol  6.25 mg Oral BID WC  . cloNIDine  0.1 mg Oral BID  . furosemide  40 mg Intravenous Q12H  . hydrALAZINE  10 mg Oral Q8H  . insulin aspart  0-15 Units Subcutaneous TID WC  . insulin aspart  0-5 Units Subcutaneous QHS  . insulin NPH Human  10 Units Subcutaneous BID AC & HS  . isosorbide mononitrate  120 mg Oral Daily  . levothyroxine   50 mcg Oral QAC breakfast  . pantoprazole  40 mg Oral Daily  . rosuvastatin  10 mg Oral QHS  . sodium chloride flush  3 mL Intravenous Q12H   Continuous Infusions: . sodium chloride      Procedures/Studies: Dg Chest 2 View  Result Date: 09/20/2017 CLINICAL DATA:  Acute chest pain for 2 days and cough for 3 weeks. EXAM: CHEST  2 VIEW COMPARISON:  09/03/2017 and prior radiograph FINDINGS: The cardiomediastinal silhouette is unchanged. Slightly increasing small bilateral pleural effusions and bibasilar atelectasis noted. No pneumothorax.  No interval change identified. No acute bony abnormalities noted. IMPRESSION: Small bilateral pleural effusions and bibasilar atelectasis, slightly increased from prior study. Electronically Signed   By: Margarette Canada M.D.   On: 09/20/2017 19:03   Dg Chest 2 View  Result Date: 09/03/2017 CLINICAL DATA:  Cough EXAM: CHEST  2 VIEW COMPARISON:  07/18/2017 chest radiograph. FINDINGS: Stable cardiomediastinal silhouette with normal heart size and aortic atherosclerosis. No pneumothorax.  No pleural effusions. Stable chronic mild pleural-parenchymal scarring at the costophrenic angles. Hyperinflated lungs. No pulmonary edema. New patchy opacity at the medial lung bases. IMPRESSION: 1. New patchy opacity at the medial lung bases, which may indicate aspiration and/or pneumonia. Recommend follow-up PA and lateral post treatment chest radiographs in 4-6 weeks. 2. Hyperinflated lungs, suggesting obstructive lung disease. Electronically Signed   By: Ilona Sorrel M.D.   On: 09/03/2017 21:44    Lindzy Rupert, DO  [facility] Pager [phone]  If 7PM-7AM, please contact night-coverage [email or web address] Password [email or web address] 09/21/2017, 7:40 AM   LOS: 0 days

## 2017-09-21 NOTE — Care Management Obs Status (Signed)
[place] NOTIFICATION   Patient Details  Name: [name] MRN: [id number] Date of Birth: 19-Aug-1932   Medicare Observation Status Notification Given:  Yes    Phylliss Strege, Chauncey [place], RN 09/21/2017, 11:04 AM

## 2017-09-21 NOTE — Care Management Note (Signed)
Case Management Note  Patient Details  Name: [name] MRN: [id number] Date of Birth: Oct 06, 1932  Subjective/Objective:   Adm with CHF. From home with daughter. Daughter is with her for most all the day-only gone for 2 hours a day. She reports no Home health currently. Walks with RW, has BSC pta. Has PCP, daughter takes her to appointments. Reports no issues affording medications.                  Action/Plan: Only supervision recommendations from PT and OT. Discussed with patient, daughter at home with patient. No CM needs communicated.   Expected Discharge Date:  09/20/17               Expected Discharge Plan:  Home/Self Care  In-House Referral:     Discharge planning Services  CM Consult  Post Acute Care Choice:  NA Choice offered to:  NA  DME Arranged:    DME Agency:     HH Arranged:    HH Agency:     Status of Service:  Completed, signed off  If discussed at [employer] of Stay Meetings, dates discussed:    Additional Comments:  Kenni Newton, Chauncey [place], RN 09/21/2017, 11:05 AM

## 2017-09-22 DIAGNOSIS — N039 Chronic nephritic syndrome with unspecified morphologic changes: Secondary | ICD-10-CM

## 2017-09-22 DIAGNOSIS — N184 Chronic kidney disease, stage 4 (severe): Secondary | ICD-10-CM

## 2017-09-22 DIAGNOSIS — I509 Heart failure, unspecified: Secondary | ICD-10-CM

## 2017-09-22 DIAGNOSIS — N179 Acute kidney failure, unspecified: Secondary | ICD-10-CM

## 2017-09-22 LAB — GLUCOSE, CAPILLARY
GLUCOSE-CAPILLARY: 132 mg/dL — AB (ref 65–99)
GLUCOSE-CAPILLARY: 78 mg/dL (ref 65–99)
Glucose-Capillary: 93 mg/dL (ref 65–99)
Glucose-Capillary: 153 mg/dL — ABNORMAL HIGH (ref 65–99)

## 2017-09-22 LAB — BASIC METABOLIC PANEL
Anion gap: 13 (ref 5–15)
BUN: 53 mg/dL — AB (ref 6–20)
CALCIUM: 10.1 mg/dL (ref 8.9–10.3)
CHLORIDE: 108 mmol/L (ref 101–111)
CO2: 19 mmol/L — AB (ref 22–32)
CREATININE: 3.38 mg/dL — AB (ref 0.44–1.00)
GFR calc Af Amer: 13 mL/min — ABNORMAL LOW (ref >60)
GFR calc non Af Amer: 12 mL/min — ABNORMAL LOW (ref >60)
GLUCOSE: 98 mg/dL (ref 65–99)
Potassium: 5.2 mmol/L — ABNORMAL HIGH (ref 3.5–5.1)
Sodium: 140 mmol/L (ref 135–145)

## 2017-09-22 LAB — CBC
HCT: 28.7 % — ABNORMAL LOW (ref 36.0–46.0)
Hemoglobin: 9 g/dL — ABNORMAL LOW (ref 12.0–15.0)
MCH: 29.3 pg (ref 26.0–34.0)
MCHC: 31.4 g/dL (ref 30.0–36.0)
MCV: 93.5 fL (ref 78.0–100.0)
PLATELETS: 137 10*3/uL — AB (ref 150–400)
RBC: 3.07 MIL/uL — ABNORMAL LOW (ref 3.87–5.11)
RDW: 14.6 % (ref 11.5–15.5)
WBC: 3.3 10*3/uL — ABNORMAL LOW (ref 4.0–10.5)

## 2017-09-22 LAB — HEMOGLOBIN A1C
HEMOGLOBIN A1C: 5.7 % — AB (ref 4.8–5.6)
Mean Plasma Glucose: 117 mg/dL

## 2017-09-22 LAB — TROPONIN I: Troponin I: <0.03 ng/mL (ref <0.03)

## 2017-09-22 LAB — D-DIMER, QUANTITATIVE: D-Dimer, Quant: 0.5 ug/mL-FEU (ref 0.00–0.50)

## 2017-09-22 MED ORDER — GI COCKTAIL ~~LOC~~
30.0000 mL | Freq: Once | ORAL | Status: AC
  Administered 2017-09-22: 30 mL via ORAL
  Filled 2017-09-22: qty 30

## 2017-09-22 MED ORDER — CARVEDILOL 12.5 MG PO TABS
12.5000 mg | ORAL_TABLET | Freq: Two times a day (BID) | ORAL | Status: DC
  Administered 2017-09-22 – 2017-09-24 (×4): 12.5 mg via ORAL
  Filled 2017-09-22 (×4): qty 1

## 2017-09-22 MED ORDER — PANTOPRAZOLE SODIUM 40 MG IV SOLR
40.0000 mg | Freq: Two times a day (BID) | INTRAVENOUS | Status: DC
  Administered 2017-09-22 – 2017-09-23 (×3): 40 mg via INTRAVENOUS
  Filled 2017-09-22 (×3): qty 40

## 2017-09-22 NOTE — Care Management (Signed)
    Durable Medical Equipment        Start     Ordered   09/21/17 1428  For home use only DME Hospital bed  Once    Question Answer Comment  Patient has (list medical condition): CHF, deconditioning   The above medical condition requires: Patient requires the ability to reposition frequently   Bed type Semi-electric      09/21/17 1427   09/21/17 1428  For home use only DME Overbed table  Once     09/21/17 1427

## 2017-09-22 NOTE — Care Management (Signed)
Discussed with home health RN with daughter, she is agreeable. Would like [facility].

## 2017-09-22 NOTE — Progress Notes (Signed)
PT Cancellation Note  Patient Details Name: [name] MRN: [id number] DOB: Sep 07, 1932   Cancelled Treatment:    Reason Eval/Treat Not Completed: Patient declined, no reason specified  Patient declining therapy this morning secondary to pain and nausea with dry heaves. When asked to rate pain patient would not give number from 0-10 scale (described pain as "really really bad") PT spent 10 minutes discussing benefits of mobility in alleviating discomfort. Patient agreeable to try therapy in afternoon if feeling better. PT will attempt after lunch.   Debara Pickett, PT, DPT Physical Therapist with [facility]  09/22/2017 10:31 AM

## 2017-09-22 NOTE — Progress Notes (Addendum)
PROGRESS NOTE   TOWSON GYF:[id number] DOB: January 22, 1932 DOA: 09/20/2017 PCP: Timmothy Euler, MD  Brief History:  83 year old female with a history of diabetes mellitus, essential hypertension, CKD stage IV, coronary artery disease, hyperlipidemia presenting with 4-day history of worsening shortness of breath.  The patient states that she has been struggling with intermittent chest discomfort and shortness of breath for the past 1-2 months.  The patient had an emergency department visit on September 03, 2017 when she was diagnosed with pneumonia and sent home with Augmentin and doxycycline.  She only had minimal improvement, and continues to have intermittent shortness of breath and chest discomfort which has worsened over the past 4 days.  She went to see her primary care provider on September 18, 2017.  There was concern at that time for increased leg edema.  In addition, the patient's daughter relates that she had her torsemide dose decreased approximately 6 months ago when she previously took 60 mg daily.  She has been on torsemide 20 mg daily since that period of time.  Upon admission, the patient had a chest x-ray which showed increased bilateral pleural effusions with BNP 1243.  The patient was started on intravenous furosemide.  Assessment/Plan: Acute on chronic diastolic CHF -Suspect uncontrolled hypertension may be playing a role -Continue furosemide 40 mg IV twice daily -Review of the record shows that her dry weight is 140-142 -Daily weights--question accuracy -Strict I's and Os--not accurate -10/25--echocardiogram--EF 60-65%, grade 2 DD, moderate MR, trivial TR -personally reviewed CXR--bilateral pleural effusions  CKD stage IV -Suspect patient has had continued progression of her CKD -Previous baseline 2.8-3.0 -The patient will need to tolerate worsening renal function for improved respiratory status -She is a poor dialysis candidate -Monitor renal function with  diuresis  Nausea and vomiting -check Lipase; 10/25 LFTs normal -likely gastritits -increase protonix to bid -prn antiemetic -down grade to full liquids  Essential hypertension, poorly controlled -Continue Imdur -Increase hydralazine to 25 mg twice daily  -Increase carvedilol to 12.5 mg bid -Anticipate improvement with diuresis  Thrombocytopenia  -likely due to chronic hepatic congestion from CHF -TSH--pending -Serum B12--395  Coronary artery disease/atypical chest pain -Troponins negative x4 -Echocardiogram--EF 60-65%, grade 2 DD, moderate MR, trivial TR -10/26-Personally reviewed EKG--sinus, IVCD, LVH changes, nonspecific ST-T changes -D-dimer 0.50  Leg Edema/pain -venous duplex--neg  Diabetes mellitus type 2, controlled -July 18, 2017 hemoglobin A1c 6.1 -NovoLog sliding scale -09/21/17--check A1C--5.7  Hyperlipidemia -Continue statin     Disposition Plan:   Home 10/27 or 10/28 if stable Family Communication:   Daughter updated at bedside 10/26--Total time spent 35 minutes.  Greater than 50% spent face to face counseling and coordinating care.   Consultants:  none  Code Status:  FULL  DVT Prophylaxis:   [place] Lovenox   Procedures: As Listed in Progress Note Above  Antibiotics: None    Subjective: Patient complains of nausea vomiting x2 today.  She complains of pain all over.  Overall breathing is better but she has intermittent chest pain.  Denies any fevers, chills, abdominal pain, dysuria, hematuria.  Objective: Vitals:   09/22/17 0315 09/22/17 0527 09/22/17 0839 09/22/17 1500  BP:  (!) 186/58 (!) 191/74 (!) 149/61  Pulse:  71 88 73  Resp:  18 20 18   Temp:  (!) 97.5 F (36.4 C) 97.8 F (36.6 C) 98 F (36.7 C)  TempSrc:  Oral  Oral  SpO2:  96% 97% 97%  Weight: 64.6 kg (  142 lb 8 oz)     Height:       No intake or output data in the 24 hours ending 09/22/17 1532 Weight change: -5.216 kg (-11 lb 8 oz) Exam:   General:  Pt  is alert, follows commands appropriately, not in acute distress  HEENT: No icterus, No thrush, No neck mass, [place]/AT  Cardiovascular: RRR, S1/S2, no rubs, no gallops  Respiratory:  bibasilar crackles.  No wheezing  Abdomen: Soft/+BS, non tender, non distended, no guarding  Extremities: 2 + edema, No lymphangitis, No petechiae, No rashes, no synovitis   Data Reviewed: I have personally reviewed following labs and imaging studies Basic Metabolic Panel:  Recent Labs Lab 09/20/17 1906 09/21/17 0424 09/22/17 0504  NA 140 140 140  K 5.4* 4.8 5.2*  CL 109 108 108  CO2 21* 24 19*  GLUCOSE 228* 101* 98  BUN 51* 52* 53*  CREATININE 3.68* 3.55* 3.38*  CALCIUM 9.8 9.8 10.1   Liver Function Tests:  Recent Labs Lab 09/21/17 0424  AST 10*  ALT 8*  ALKPHOS 41  BILITOT 0.5  PROT 6.6  ALBUMIN 3.5   No results for input(s): LIPASE, AMYLASE in the last 168 hours. No results for input(s): AMMONIA in the last 168 hours. Coagulation Profile: No results for input(s): INR, PROTIME in the last 168 hours. CBC:  Recent Labs Lab 09/18/17 1632 09/19/17 1304 09/20/17 1910 09/21/17 0424 09/22/17 0504  WBC 3.2*  --  3.9* 2.7* 3.3*  NEUTROABS 1.8  --  2.7 1.3*  --   HGB 9.0* 9.0* 9.5* 9.0* 9.0*  HCT 27.3*  --  30.6* 28.4* 28.7*  MCV 89  --  93.9 93.1 93.5  PLT 147*  --  100* 128* 137*   Cardiac Enzymes:  Recent Labs Lab 09/20/17 2244 09/21/17 0424 09/21/17 1050 09/22/17 0946  TROPONINI <0.03 <0.03 <0.03 <0.03   BNP: Invalid input(s): POCBNP CBG:  Recent Labs Lab 09/21/17 1154 09/21/17 1629 09/21/17 2115 09/22/17 0733 09/22/17 1143  GLUCAP 90 88 79 93 132*   HbA1C:  Recent Labs  09/21/17 0424  HGBA1C 5.7*   Urine analysis:    Component Value Date/Time   COLORURINE YELLOW 09/20/2017 1935   APPEARANCEUR HAZY (A) 09/20/2017 1935   APPEARANCEUR Cloudy (A) 07/18/2017 1338   LABSPEC 1.014 09/20/2017 1935   PHURINE 6.0 09/20/2017 1935   GLUCOSEU 150 (A)  09/20/2017 1935   HGBUR NEGATIVE 09/20/2017 1935   BILIRUBINUR NEGATIVE 09/20/2017 1935   BILIRUBINUR Negative 07/18/2017 [place] 09/20/2017 1935   PROTEINUR >=300 (A) 09/20/2017 1935   UROBILINOGEN negative 07/02/2015 1505   UROBILINOGEN 0.2 05/10/2015 1245   NITRITE NEGATIVE 09/20/2017 1935   LEUKOCYTESUR NEGATIVE 09/20/2017 1935   LEUKOCYTESUR 3+ (A) 07/18/2017 1338   Sepsis Labs: @LABRCNTIP (procalcitonin:4,lacticidven:4) ) Recent Results (from the past 240 hour(s))  Urine culture     Status: Abnormal (Preliminary result)   Collection Time: 09/20/17  7:35 PM  Result Value Ref Range Status   Specimen Description URINE, RANDOM  Final   Special Requests NONE  Final   Culture 40,000 COLONIES/mL ESCHERICHIA COLI (A)  Final   Report Status PENDING  Incomplete     Scheduled Meds: . aspirin  81 mg Oral Daily  . carvedilol  12.5 mg Oral BID WC  . furosemide  40 mg Intravenous Q12H  . hydrALAZINE  25 mg Oral BID  . insulin aspart  0-5 Units Subcutaneous QHS  . insulin aspart  0-9 Units Subcutaneous TID WC  .  insulin NPH Human  5 Units Subcutaneous BID AC & HS  . isosorbide mononitrate  120 mg Oral Daily  . levothyroxine  50 mcg Oral QAC breakfast  . pantoprazole (PROTONIX) IV  40 mg Intravenous Q12H  . rosuvastatin  10 mg Oral QHS  . sodium chloride flush  3 mL Intravenous Q12H   Continuous Infusions: . sodium chloride      Procedures/Studies: Dg Chest 2 View  Result Date: 09/20/2017 CLINICAL DATA:  Acute chest pain for 2 days and cough for 3 weeks. EXAM: CHEST  2 VIEW COMPARISON:  09/03/2017 and prior radiograph FINDINGS: The cardiomediastinal silhouette is unchanged. Slightly increasing small bilateral pleural effusions and bibasilar atelectasis noted. No pneumothorax.  No interval change identified. No acute bony abnormalities noted. IMPRESSION: Small bilateral pleural effusions and bibasilar atelectasis, slightly increased from prior study. Electronically  Signed   By: Margarette Canada M.D.   On: 09/20/2017 19:03   Dg Chest 2 View  Result Date: 09/03/2017 CLINICAL DATA:  Cough EXAM: CHEST  2 VIEW COMPARISON:  07/18/2017 chest radiograph. FINDINGS: Stable cardiomediastinal silhouette with normal heart size and aortic atherosclerosis. No pneumothorax. No pleural effusions. Stable chronic mild pleural-parenchymal scarring at the costophrenic angles. Hyperinflated lungs. No pulmonary edema. New patchy opacity at the medial lung bases. IMPRESSION: 1. New patchy opacity at the medial lung bases, which may indicate aspiration and/or pneumonia. Recommend follow-up PA and lateral post treatment chest radiographs in 4-6 weeks. 2. Hyperinflated lungs, suggesting obstructive lung disease. Electronically Signed   By: Ilona Sorrel M.D.   On: 09/03/2017 21:44   US Venous Img Lower Bilateral  Result Date: 09/21/2017 CLINICAL DATA:  Bilateral lower extremity edema. EXAM: BILATERAL LOWER EXTREMITY VENOUS DOPPLER ULTRASOUND TECHNIQUE: Gray-scale sonography with graded compression, as well as color Doppler and duplex ultrasound were performed to evaluate the lower extremity deep venous systems from the level of the common femoral vein and including the common femoral, femoral, profunda femoral, popliteal and calf veins including the posterior tibial, peroneal and gastrocnemius veins when visible. The superficial great saphenous vein was also interrogated. Spectral Doppler was utilized to evaluate flow at rest and with distal augmentation maneuvers in the common femoral, femoral and popliteal veins. COMPARISON:  CT 03/04/2017. FINDINGS: RIGHT LOWER EXTREMITY Common Femoral Vein: No evidence of thrombus. Normal compressibility, respiratory phasicity and response to augmentation. Saphenofemoral Junction: No evidence of thrombus. Normal compressibility and flow on color Doppler imaging. Profunda Femoral Vein: No evidence of thrombus. Normal compressibility and flow on color Doppler  imaging. Femoral Vein: No evidence of thrombus. Normal compressibility, respiratory phasicity and response to augmentation. Popliteal Vein: No evidence of thrombus. Normal compressibility, respiratory phasicity and response to augmentation. Calf Veins: No evidence of thrombus. Normal compressibility and flow on color Doppler imaging. Limited visualization of the peroneal veins. Superficial Great Saphenous Vein: No evidence of thrombus. Normal compressibility. Other Findings:  Soft tissue edema. LEFT LOWER EXTREMITY Common Femoral Vein: No evidence of thrombus. Normal compressibility, respiratory phasicity and response to augmentation. Saphenofemoral Junction: No evidence of thrombus. Normal compressibility and flow on color Doppler imaging. Profunda Femoral Vein: No evidence of thrombus. Normal compressibility and flow on color Doppler imaging. Femoral Vein: No evidence of thrombus. Normal compressibility, respiratory phasicity and response to augmentation. Popliteal Vein: No evidence of thrombus. Normal compressibility, respiratory phasicity and response to augmentation. Calf Veins: No evidence of thrombus. Normal compressibility and flow on color Doppler imaging. Superficial Great Saphenous Vein: No evidence of thrombus. Normal compressibility. Other Findings:  Soft tissue  edema. IMPRESSION: 1. No evidence of deep venous thrombosis. 2. Lower extremity soft tissue edema. Electronically Signed   By: Marcello Moores  Register   On: 09/21/2017 14:52    Latonya Nelon, DO  [facility] Pager [phone]  If 7PM-7AM, please contact night-coverage [email or web address] Password [email or web address] 09/22/2017, 3:32 PM   LOS: 0 days

## 2017-09-23 DIAGNOSIS — F0390 Unspecified dementia without behavioral disturbance: Secondary | ICD-10-CM | POA: Diagnosis present

## 2017-09-23 DIAGNOSIS — I130 Hypertensive heart and chronic kidney disease with heart failure and stage 1 through stage 4 chronic kidney disease, or unspecified chronic kidney disease: Secondary | ICD-10-CM | POA: Diagnosis present

## 2017-09-23 DIAGNOSIS — Z8673 Personal history of transient ischemic attack (TIA), and cerebral infarction without residual deficits: Secondary | ICD-10-CM | POA: Diagnosis not present

## 2017-09-23 DIAGNOSIS — Z79890 Hormone replacement therapy (postmenopausal): Secondary | ICD-10-CM | POA: Diagnosis not present

## 2017-09-23 DIAGNOSIS — N179 Acute kidney failure, unspecified: Secondary | ICD-10-CM | POA: Diagnosis not present

## 2017-09-23 DIAGNOSIS — D631 Anemia in chronic kidney disease: Secondary | ICD-10-CM | POA: Diagnosis present

## 2017-09-23 DIAGNOSIS — I509 Heart failure, unspecified: Secondary | ICD-10-CM

## 2017-09-23 DIAGNOSIS — Z794 Long term (current) use of insulin: Secondary | ICD-10-CM | POA: Diagnosis not present

## 2017-09-23 DIAGNOSIS — Z888 Allergy status to other drugs, medicaments and biological substances status: Secondary | ICD-10-CM | POA: Diagnosis not present

## 2017-09-23 DIAGNOSIS — I2510 Atherosclerotic heart disease of native coronary artery without angina pectoris: Secondary | ICD-10-CM | POA: Diagnosis not present

## 2017-09-23 DIAGNOSIS — R079 Chest pain, unspecified: Secondary | ICD-10-CM | POA: Diagnosis not present

## 2017-09-23 DIAGNOSIS — Z66 Do not resuscitate: Secondary | ICD-10-CM | POA: Diagnosis present

## 2017-09-23 DIAGNOSIS — Z79899 Other long term (current) drug therapy: Secondary | ICD-10-CM | POA: Diagnosis not present

## 2017-09-23 DIAGNOSIS — N184 Chronic kidney disease, stage 4 (severe): Secondary | ICD-10-CM | POA: Diagnosis not present

## 2017-09-23 DIAGNOSIS — E1122 Type 2 diabetes mellitus with diabetic chronic kidney disease: Secondary | ICD-10-CM | POA: Diagnosis present

## 2017-09-23 DIAGNOSIS — K761 Chronic passive congestion of liver: Secondary | ICD-10-CM | POA: Diagnosis present

## 2017-09-23 DIAGNOSIS — E875 Hyperkalemia: Secondary | ICD-10-CM | POA: Diagnosis present

## 2017-09-23 DIAGNOSIS — I5033 Acute on chronic diastolic (congestive) heart failure: Secondary | ICD-10-CM | POA: Diagnosis not present

## 2017-09-23 DIAGNOSIS — D696 Thrombocytopenia, unspecified: Secondary | ICD-10-CM | POA: Diagnosis present

## 2017-09-23 DIAGNOSIS — Z885 Allergy status to narcotic agent status: Secondary | ICD-10-CM | POA: Diagnosis not present

## 2017-09-23 DIAGNOSIS — E785 Hyperlipidemia, unspecified: Secondary | ICD-10-CM | POA: Diagnosis present

## 2017-09-23 DIAGNOSIS — Z7982 Long term (current) use of aspirin: Secondary | ICD-10-CM | POA: Diagnosis not present

## 2017-09-23 LAB — CBC
HCT: 25.3 % — ABNORMAL LOW (ref 36.0–46.0)
Hemoglobin: 7.9 g/dL — ABNORMAL LOW (ref 12.0–15.0)
MCH: 29.4 pg (ref 26.0–34.0)
MCHC: 31.2 g/dL (ref 30.0–36.0)
MCV: 94.1 fL (ref 78.0–100.0)
PLATELETS: 141 10*3/uL — AB (ref 150–400)
RBC: 2.69 MIL/uL — ABNORMAL LOW (ref 3.87–5.11)
RDW: 14.8 % (ref 11.5–15.5)
WBC: 2.6 10*3/uL — ABNORMAL LOW (ref 4.0–10.5)

## 2017-09-23 LAB — BASIC METABOLIC PANEL
ANION GAP: 10 (ref 5–15)
BUN: 54 mg/dL — ABNORMAL HIGH (ref 6–20)
CALCIUM: 9.9 mg/dL (ref 8.9–10.3)
CO2: 24 mmol/L (ref 22–32)
CREATININE: 3.6 mg/dL — AB (ref 0.44–1.00)
Chloride: 101 mmol/L (ref 101–111)
GFR, EST AFRICAN AMERICAN: 12 mL/min — AB (ref >60)
GFR, EST NON AFRICAN AMERICAN: 11 mL/min — AB (ref >60)
GLUCOSE: 175 mg/dL — AB (ref 65–99)
Potassium: 5.4 mmol/L — ABNORMAL HIGH (ref 3.5–5.1)
Sodium: 135 mmol/L (ref 135–145)

## 2017-09-23 LAB — GLUCOSE, CAPILLARY
GLUCOSE-CAPILLARY: 99 mg/dL (ref 65–99)
Glucose-Capillary: 211 mg/dL — ABNORMAL HIGH (ref 65–99)
Glucose-Capillary: 152 mg/dL — ABNORMAL HIGH (ref 65–99)
Glucose-Capillary: 175 mg/dL — ABNORMAL HIGH (ref 65–99)

## 2017-09-23 LAB — URINE CULTURE

## 2017-09-23 MED ORDER — TORSEMIDE 20 MG PO TABS
20.0000 mg | ORAL_TABLET | Freq: Every day | ORAL | Status: DC
  Administered 2017-09-24: 20 mg via ORAL
  Filled 2017-09-23: qty 1

## 2017-09-23 MED ORDER — SODIUM POLYSTYRENE SULFONATE 15 GM/60ML PO SUSP
30.0000 g | Freq: Once | ORAL | Status: AC
  Administered 2017-09-23: 30 g via ORAL
  Filled 2017-09-23: qty 120

## 2017-09-23 MED ORDER — HYDRALAZINE HCL 25 MG PO TABS
50.0000 mg | ORAL_TABLET | Freq: Two times a day (BID) | ORAL | Status: DC
  Administered 2017-09-23 – 2017-09-24 (×2): 50 mg via ORAL
  Filled 2017-09-23 (×2): qty 2

## 2017-09-23 NOTE — Progress Notes (Signed)
PROGRESS NOTE  KESTREL MIS BOF:[id number] DOB: 1932/04/10 DOA: 09/20/2017 PCP: Timmothy Euler, MD  Brief History: 83 year old female with a history of diabetes mellitus, essential hypertension, CKD stage IV, coronary artery disease, hyperlipidemia presenting with 4-day history of worsening shortness of breath. The patient states that she has been struggling with intermittent chest discomfort and shortness of breath for the past 1-2 months. The patient had an emergency department visit on September 03, 2017 when she was diagnosed with pneumonia and sent home with Augmentin and doxycycline. She only had minimal improvement, and continues to have intermittent shortness of breath and chest discomfort which has worsened over the past 4 days. She went to see her primary care provider on September 18, 2017. There was concern at that time for increased leg edema. In addition, the patient's daughter relates that she had her torsemide dose decreased approximately 6 months ago when she previously took 60 mg daily. She has been on torsemide 20 mg daily since that period of time. Upon admission, the patient had a chest x-ray which showed increased bilateral pleural effusions with BNP 1243. The patient was started on intravenous furosemide with good clinical results.  Assessment/Plan: Acute on chronic diastolic CHF -Suspect uncontrolled hypertension may be playing a role -Continue furosemide 40 mg IV twice daily>>>torsemide 20 mg bid -Review of the record shows that her dry weight is 140-142 -Daily weights--NEG 12 lbs since admissions -Strict I's and Os--not accurate -10/25--echocardiogram--EF 60-65%, grade 2 DD, moderate MR, trivial TR -personally reviewed CXR--bilateral pleural effusions  CKD stage IV -Suspect patient has had continued progression of her CKD -Previous baseline 2.8-3.0-->now likely has new renal baseline 3.3-3.6 -The patient will need to tolerate worsening renal  function for improved respiratory status -She is a poor dialysis candidate -Monitor renal function with diuresis  Nausea and vomiting -check Lipase; 10/25 LFTs normal -likely gastritits -increased protonix to bid -prn antiemetic -advance to cardiac diet  Hyperkalemia -kayexalate x 1 -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 -am BMP  Essential hypertension, poorly controlled -Continue Imdur -Increase hydralazine to 50 mg twice daily -Increase carvedilol to 12.5 mg bid  Thrombocytopenia  -likely due to chronic hepatic congestion from CHF -TSH--pending -Serum B12--395  Coronary artery disease/atypical chest pain -Troponins negative x4 -Echocardiogram--EF 60-65%, grade 2 DD, moderate MR, trivial TR -10/26-Personally reviewed EKG--sinus, IVCD, LVH changes, nonspecific ST-T changes -D-dimer 0.50  Leg Edema/pain -venous duplex--neg  Diabetes mellitus type 2, controlled -July 18, 2017 hemoglobin A1c 6.1 -NovoLog sliding scale -09/21/17--check A1C--5.7  Hyperlipidemia -Continue statin     Disposition Plan: Home 10/28 if stable Family Communication: Daughter updated on phone 10/27   Consultants: none  Code Status: FULL  DVT Prophylaxis:  [place] Lovenox   Procedures: As Listed in Progress Note Above  Antibiotics: None     Subjective: Patient continues to have intermittent "pain all over".  She states that she is breathing better overall.  No emesis in the last 24 hours.  Denies fevers, chills, headache, abdominal pain, dysuria, hematuria.  No chest pain.  Objective: Vitals:   09/22/17 1500 09/22/17 2222 09/23/17 0613 09/23/17 1423  BP: (!) 149/61 (!) 148/49 (!) 177/55 (!) 163/49  Pulse: 73 71 78 78  Resp: 18 20 20 19   Temp: 98 F (36.7 C) 98.5 F (36.9 C) 98 F (36.7 C) 98.1 F (36.7 C)  TempSrc: Oral Oral Oral Oral  SpO2: 97% 96%  100%  Weight:   63.5 kg (139 lb  15.9 oz)   Height:        Intake/Output Summary (Last 24 hours) at  09/23/17 1538 Last data filed at 09/23/17 1100  Gross per 24 hour  Intake                0 ml  Output             1000 ml  Net            -1000 ml   Weight change: -1.138 kg (-2 lb 8.1 oz) Exam:   General:  Pt is alert, follows commands appropriately, not in acute distress  HEENT: No icterus, No thrush, No neck mass, [place]/AT  Cardiovascular: RRR, S1/S2, no rubs, no gallops  Respiratory: CTA bilaterally, no wheezing, no crackles, no rhonchi  Abdomen: Soft/+BS, non tender, non distended, no guarding  Extremities: 1 + R>LLE edema, No lymphangitis, No petechiae, No rashes, no synovitis   Data Reviewed: I have personally reviewed following labs and imaging studies Basic Metabolic Panel:  Recent Labs Lab 09/20/17 1906 09/21/17 0424 09/22/17 0504 09/23/17 1005  NA 140 140 140 135  K 5.4* 4.8 5.2* 5.4*  CL 109 108 108 101  CO2 21* 24 19* 24  GLUCOSE 228* 101* 98 175*  BUN 51* 52* 53* 54*  CREATININE 3.68* 3.55* 3.38* 3.60*  CALCIUM 9.8 9.8 10.1 9.9   Liver Function Tests:  Recent Labs Lab 09/21/17 0424  AST 10*  ALT 8*  ALKPHOS 41  BILITOT 0.5  PROT 6.6  ALBUMIN 3.5   No results for input(s): LIPASE, AMYLASE in the last 168 hours. No results for input(s): AMMONIA in the last 168 hours. Coagulation Profile: No results for input(s): INR, PROTIME in the last 168 hours. CBC:  Recent Labs Lab 09/18/17 1632 09/19/17 1304 09/20/17 1910 09/21/17 0424 09/22/17 0504 09/23/17 1005  WBC 3.2*  --  3.9* 2.7* 3.3* 2.6*  NEUTROABS 1.8  --  2.7 1.3*  --   --   HGB 9.0* 9.0* 9.5* 9.0* 9.0* 7.9*  HCT 27.3*  --  30.6* 28.4* 28.7* 25.3*  MCV 89  --  93.9 93.1 93.5 94.1  PLT 147*  --  100* 128* 137* 141*   Cardiac Enzymes:  Recent Labs Lab 09/20/17 2244 09/21/17 0424 09/21/17 1050 09/22/17 0946  TROPONINI <0.03 <0.03 <0.03 <0.03   BNP: Invalid input(s): POCBNP CBG:  Recent Labs Lab 09/22/17 1143 09/22/17 1728 09/22/17 2109 09/23/17 0747 09/23/17 1237    GLUCAP 132* 153* 78 99 211*   HbA1C:  Recent Labs  09/21/17 0424  HGBA1C 5.7*   Urine analysis:    Component Value Date/Time   COLORURINE YELLOW 09/20/2017 1935   APPEARANCEUR HAZY (A) 09/20/2017 1935   APPEARANCEUR Cloudy (A) 07/18/2017 1338   LABSPEC 1.014 09/20/2017 1935   PHURINE 6.0 09/20/2017 1935   GLUCOSEU 150 (A) 09/20/2017 [place] NEGATIVE 09/20/2017 [place] NEGATIVE 09/20/2017 1935   BILIRUBINUR Negative 07/18/2017 [place] 09/20/2017 1935   PROTEINUR >=300 (A) 09/20/2017 1935   UROBILINOGEN negative 07/02/2015 1505   UROBILINOGEN 0.2 05/10/2015 1245   NITRITE NEGATIVE 09/20/2017 1935   LEUKOCYTESUR NEGATIVE 09/20/2017 1935   LEUKOCYTESUR 3+ (A) 07/18/2017 1338   Sepsis Labs: @LABRCNTIP (procalcitonin:4,lacticidven:4) ) Recent Results (from the past 240 hour(s))  Urine culture     Status: Abnormal   Collection Time: 09/20/17  7:35 PM  Result Value Ref Range Status   Specimen Description URINE, RANDOM  Final   Special  Requests NONE  Final   Culture 40,000 COLONIES/mL ESCHERICHIA COLI (A)  Final   Report Status 09/23/2017 FINAL  Final   Organism ID, Bacteria ESCHERICHIA COLI (A)  Final      Susceptibility   Escherichia coli - MIC*    AMPICILLIN 8 SENSITIVE Sensitive     CEFAZOLIN <=4 SENSITIVE Sensitive     CEFTRIAXONE <=1 SENSITIVE Sensitive     CIPROFLOXACIN >=4 RESISTANT Resistant     GENTAMICIN <=1 SENSITIVE Sensitive     IMIPENEM <=0.25 SENSITIVE Sensitive     NITROFURANTOIN 32 SENSITIVE Sensitive     TRIMETH/SULFA >=320 RESISTANT Resistant     AMPICILLIN/SULBACTAM 4 SENSITIVE Sensitive     PIP/TAZO <=4 SENSITIVE Sensitive     Extended ESBL NEGATIVE Sensitive     * 40,000 COLONIES/mL ESCHERICHIA COLI     Scheduled Meds: . aspirin  81 mg Oral Daily  . carvedilol  12.5 mg Oral BID WC  . hydrALAZINE  50 mg Oral BID  . insulin aspart  0-5 Units Subcutaneous QHS  . insulin aspart  0-9 Units Subcutaneous TID WC   . insulin NPH Human  5 Units Subcutaneous BID AC & HS  . isosorbide mononitrate  120 mg Oral Daily  . levothyroxine  50 mcg Oral QAC breakfast  . pantoprazole (PROTONIX) IV  40 mg Intravenous Q12H  . rosuvastatin  10 mg Oral QHS  . sodium chloride flush  3 mL Intravenous Q12H  . sodium polystyrene  30 g Oral Once  . [START ON 09/24/2017] torsemide  20 mg Oral Daily   Continuous Infusions: . sodium chloride      Procedures/Studies: Dg Chest 2 View  Result Date: 09/20/2017 CLINICAL DATA:  Acute chest pain for 2 days and cough for 3 weeks. EXAM: CHEST  2 VIEW COMPARISON:  09/03/2017 and prior radiograph FINDINGS: The cardiomediastinal silhouette is unchanged. Slightly increasing small bilateral pleural effusions and bibasilar atelectasis noted. No pneumothorax.  No interval change identified. No acute bony abnormalities noted. IMPRESSION: Small bilateral pleural effusions and bibasilar atelectasis, slightly increased from prior study. Electronically Signed   By: Margarette Canada M.D.   On: 09/20/2017 19:03   Dg Chest 2 View  Result Date: 09/03/2017 CLINICAL DATA:  Cough EXAM: CHEST  2 VIEW COMPARISON:  07/18/2017 chest radiograph. FINDINGS: Stable cardiomediastinal silhouette with normal heart size and aortic atherosclerosis. No pneumothorax. No pleural effusions. Stable chronic mild pleural-parenchymal scarring at the costophrenic angles. Hyperinflated lungs. No pulmonary edema. New patchy opacity at the medial lung bases. IMPRESSION: 1. New patchy opacity at the medial lung bases, which may indicate aspiration and/or pneumonia. Recommend follow-up PA and lateral post treatment chest radiographs in 4-6 weeks. 2. Hyperinflated lungs, suggesting obstructive lung disease. Electronically Signed   By: Ilona Sorrel M.D.   On: 09/03/2017 21:44   US Venous Img Lower Bilateral  Result Date: 09/21/2017 CLINICAL DATA:  Bilateral lower extremity edema. EXAM: BILATERAL LOWER EXTREMITY VENOUS DOPPLER  ULTRASOUND TECHNIQUE: Gray-scale sonography with graded compression, as well as color Doppler and duplex ultrasound were performed to evaluate the lower extremity deep venous systems from the level of the common femoral vein and including the common femoral, femoral, profunda femoral, popliteal and calf veins including the posterior tibial, peroneal and gastrocnemius veins when visible. The superficial great saphenous vein was also interrogated. Spectral Doppler was utilized to evaluate flow at rest and with distal augmentation maneuvers in the common femoral, femoral and popliteal veins. COMPARISON:  CT 03/04/2017. FINDINGS: RIGHT LOWER EXTREMITY Common  Femoral Vein: No evidence of thrombus. Normal compressibility, respiratory phasicity and response to augmentation. Saphenofemoral Junction: No evidence of thrombus. Normal compressibility and flow on color Doppler imaging. Profunda Femoral Vein: No evidence of thrombus. Normal compressibility and flow on color Doppler imaging. Femoral Vein: No evidence of thrombus. Normal compressibility, respiratory phasicity and response to augmentation. Popliteal Vein: No evidence of thrombus. Normal compressibility, respiratory phasicity and response to augmentation. Calf Veins: No evidence of thrombus. Normal compressibility and flow on color Doppler imaging. Limited visualization of the peroneal veins. Superficial Great Saphenous Vein: No evidence of thrombus. Normal compressibility. Other Findings:  Soft tissue edema. LEFT LOWER EXTREMITY Common Femoral Vein: No evidence of thrombus. Normal compressibility, respiratory phasicity and response to augmentation. Saphenofemoral Junction: No evidence of thrombus. Normal compressibility and flow on color Doppler imaging. Profunda Femoral Vein: No evidence of thrombus. Normal compressibility and flow on color Doppler imaging. Femoral Vein: No evidence of thrombus. Normal compressibility, respiratory phasicity and response to  augmentation. Popliteal Vein: No evidence of thrombus. Normal compressibility, respiratory phasicity and response to augmentation. Calf Veins: No evidence of thrombus. Normal compressibility and flow on color Doppler imaging. Superficial Great Saphenous Vein: No evidence of thrombus. Normal compressibility. Other Findings:  Soft tissue edema. IMPRESSION: 1. No evidence of deep venous thrombosis. 2. Lower extremity soft tissue edema. Electronically Signed   By: Marcello Moores  Register   On: 09/21/2017 14:52    Adisa Litt, DO  [facility] Pager [phone]  If 7PM-7AM, please contact night-coverage [email or web address] Password [email or web address] 09/23/2017, 3:38 PM   LOS: 0 days

## 2017-09-23 NOTE — Discharge Summary (Signed)
Physician Discharge Summary  [name]:[id number] DOB: [birth year] DOA: 09/20/2017  PCP: Timmothy Euler, MD  Admit date: 09/20/2017 Discharge date: 09/24/2017  Admitted From: Home Disposition:  Home   Recommendations for Outpatient Follow-up:  1. Follow up with PCP in 1-2 weeks 2. Please obtain BMP/CBC in one week   Home Health:YES Equipment/Devices:hospital bed, overbed table  Discharge Condition: Stable CODE STATUS: DNR Diet recommendation: Heart Healthy / Carb Modified   Brief/Interim Summary: 83 year old female with a history of diabetes mellitus, essential hypertension, CKD stage IV, coronary artery disease, hyperlipidemia presenting with 4-day history of worsening shortness of breath. The patient states that she has been struggling with intermittent chest discomfort and shortness of breath for the past 1-2 months. The patient had an emergency department visit on September 03, 2017 when she was diagnosed with pneumonia and sent home with Augmentin and doxycycline. She only had minimal improvement, and continues to have intermittent shortness of breath and chest discomfort which has worsened over the past 4 days. She went to see her primary care provider on September 18, 2017. There was concern at that time for increased leg edema. In addition, the patient's daughter relates that she had her torsemide dose decreased approximately 6 months ago when she previously took 60 mg daily. She has been on torsemide 20 mg daily since that period of time. Upon admission, the patient had a chest x-ray which showed increased bilateral pleural effusions with BNP 1243. The patient was started on intravenous furosemide with good clinical results.   Discharge Diagnoses:  Acute on chronic diastolic CHF -Suspect uncontrolled hypertension may be playing a role -Continue furosemide 40 mg IV twice daily>>>torsemide 20 mg bid -Review of the record shows that her previous dry weight is  140-142 -discharge weight 136 lbs -Daily weights--NEG 15 lbs since admissions -Strict I's and Os--not accurate -10/25--echocardiogram--EF 60-65%, grade 2 DD, moderate MR, trivial TR -personally reviewed CXR--bilateral pleural effusions  CKD stage IV -Suspect patient has had continued progression of her CKD -Previous baseline 2.8-3.0-->now likely has new renal baseline 3.3-3.6 -The patient will need to tolerate worsening renal function for improved respiratory status -She is a poor dialysis candidate -Monitor renal function with diuresis -serum creatinine 3.30 on day of d/c  Nausea and vomiting -check Lipase; 10/25 LFTs normal -likely gastritits -increased protonix to bid x one month, then back to daily -prn antiemetic -advance to cardiac diet-->tolerated  Hyperkalemia -kayexalate x 1 -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 -recheck BMP in one week  Essential hypertension, poorly controlled -Continue Imdur -Increase hydralazine to 50 mg twice daily -Increase carvedilol to 25 mgbid -restart amlodipine at lower dose 5 mg daily  Thrombocytopenia  -likely due to chronic hepatic congestion from CHF -TSH--pending -Serum B12--395  Coronary artery disease/atypical chest pain -Troponins negative x4 -Echocardiogram--EF 60-65%, grade 2 DD, moderate MR, trivial TR -10/26-Personally reviewed EKG--sinus, IVCD, LVH changes, nonspecific ST-T changes -D-dimer 0.50  Leg Edema/pain -venous duplex--neg  Diabetes mellitus type 2, controlled -July 18, 2017 hemoglobin A1c 6.1 -NovoLog sliding scale -09/21/17--check A1C--5.7  Hyperlipidemia -Continue statin  Discharge Instructions  Discharge Instructions    Diet - low sodium heart healthy    Complete by:  As directed    Increase activity slowly    Complete by:  As directed      Allergies as of 09/24/2017      Reactions   Propoxyphene N-acetaminophen    Pt does not know reaction   Simvastatin Other (See Comments)   headache  Medication List    STOP taking these medications   cloNIDine 0.1 MG tablet Commonly known as:  CATAPRES     TAKE these medications   ACCU-CHEK AVIVA PLUS test strip Generic drug:  glucose blood CHECK BLOOD SUGER UP TO 3 TIMES A DAY   amLODipine 5 MG tablet Commonly known as:  NORVASC Take 1 tablet (5 mg total) by mouth daily. What changed:  medication strength  See the new instructions.   aspirin 81 MG EC tablet Take 1 tablet (81 mg total) by mouth daily.   carvedilol 25 MG tablet Commonly known as:  COREG Take 1 tablet (25 mg total) by mouth 2 (two) times daily with a meal. What changed:  medication strength  See the new instructions.   cholecalciferol 1000 units tablet Commonly known as:  VITAMIN D Take 1,000 Units by mouth daily.   diclofenac sodium 1 % Gel Commonly known as:  VOLTAREN Apply 4 g topically 4 (four) times daily. What changed:  when to take this  reasons to take this   hydrALAZINE 50 MG tablet Commonly known as:  APRESOLINE Take 1 tablet (50 mg total) by mouth 2 (two) times daily. What changed:  medication strength  See the new instructions.   HYDROcodone-acetaminophen 5-325 MG tablet Commonly known as:  NORCO Take 1 tablet by mouth every 6 (six) hours as needed for moderate pain. What changed:  how much to take   insulin NPH Human 100 UNIT/ML injection Commonly known as:  HUMULIN N Inject 0.1 mLs (10 Units total) into the skin 2 (two) times daily before a meal. What changed:  additional instructions   Insulin Syringe-Needle U-100 27G X 1/2" 1 ML Misc Use as needed   isosorbide mononitrate 120 MG 24 hr tablet Commonly known as:  IMDUR TAKE 1 TABLET DAILY   levothyroxine 50 MCG tablet Commonly known as:  SYNTHROID, LEVOTHROID TAKE 1 TABLET DAILY   nitroGLYCERIN 0.4 MG SL tablet Commonly known as:  NITROSTAT PLACE 1 TABLET UNDER THE TONGUE AT ONSET OF CHEST PAIN EVERY 5 MINTUES UP TO 3 TIMES AS NEEDED    pantoprazole 40 MG tablet Commonly known as:  PROTONIX Take 1 tablet (40 mg total) by mouth 2 (two) times daily. X one month, then once daily What changed:  See the new instructions.   polyethylene glycol powder powder Commonly known as:  GLYCOLAX/MIRALAX MIX 1 CAPFUL (17 GRAMS) WITH 8OZ OF WATER OR JUICE DAILY. What changed:  See the new instructions.   rosuvastatin 10 MG tablet Commonly known as:  CRESTOR TAKE 1 TABLET DAILY What changed:  See the new instructions.   sodium polystyrene 15 GM/60ML suspension Commonly known as:  KAYEXALATE Take 60 mLs (15 g total) by mouth daily. What changed:  when to take this  reasons to take this   sucralfate 1 GM/10ML suspension Commonly known as:  CARAFATE Take 10 mLs (1 g total) by mouth every 6 (six) hours. What changed:  when to take this  reasons to take this   torsemide 20 MG tablet Commonly known as:  DEMADEX Take 1 tablet (20 mg total) by mouth 2 (two) times daily. What changed:  when to take this            Durable Medical Equipment        Start     Ordered   09/21/17 1428  For home use only DME Hospital bed  Once    Question Answer Comment  Patient has (list medical condition):  CHF, deconditioning   The above medical condition requires: Patient requires the ability to reposition frequently   Bed type Semi-electric      09/21/17 1427   09/21/17 1428  For home use only DME Overbed table  Once     09/21/17 1427      Allergies  Allergen Reactions  . Propoxyphene N-Acetaminophen     Pt does not know reaction  . Simvastatin Other (See Comments)    headache    Consultations:  none   Procedures/Studies: Dg Chest 2 View  Result Date: 09/20/2017 CLINICAL DATA:  Acute chest pain for 2 days and cough for 3 weeks. EXAM: CHEST  2 VIEW COMPARISON:  09/03/2017 and prior radiograph FINDINGS: The cardiomediastinal silhouette is unchanged. Slightly increasing small bilateral pleural effusions and bibasilar  atelectasis noted. No pneumothorax.  No interval change identified. No acute bony abnormalities noted. IMPRESSION: Small bilateral pleural effusions and bibasilar atelectasis, slightly increased from prior study. Electronically Signed   By: Margarette Canada M.D.   On: 09/20/2017 19:03   Dg Chest 2 View  Result Date: 09/03/2017 CLINICAL DATA:  Cough EXAM: CHEST  2 VIEW COMPARISON:  07/18/2017 chest radiograph. FINDINGS: Stable cardiomediastinal silhouette with normal heart size and aortic atherosclerosis. No pneumothorax. No pleural effusions. Stable chronic mild pleural-parenchymal scarring at the costophrenic angles. Hyperinflated lungs. No pulmonary edema. New patchy opacity at the medial lung bases. IMPRESSION: 1. New patchy opacity at the medial lung bases, which may indicate aspiration and/or pneumonia. Recommend follow-up PA and lateral post treatment chest radiographs in 4-6 weeks. 2. Hyperinflated lungs, suggesting obstructive lung disease. Electronically Signed   By: Ilona Sorrel M.D.   On: 09/03/2017 21:44   US Venous Img Lower Bilateral  Result Date: 09/21/2017 CLINICAL DATA:  Bilateral lower extremity edema. EXAM: BILATERAL LOWER EXTREMITY VENOUS DOPPLER ULTRASOUND TECHNIQUE: Gray-scale sonography with graded compression, as well as color Doppler and duplex ultrasound were performed to evaluate the lower extremity deep venous systems from the level of the common femoral vein and including the common femoral, femoral, profunda femoral, popliteal and calf veins including the posterior tibial, peroneal and gastrocnemius veins when visible. The superficial great saphenous vein was also interrogated. Spectral Doppler was utilized to evaluate flow at rest and with distal augmentation maneuvers in the common femoral, femoral and popliteal veins. COMPARISON:  CT 03/04/2017. FINDINGS: RIGHT LOWER EXTREMITY Common Femoral Vein: No evidence of thrombus. Normal compressibility, respiratory phasicity and response  to augmentation. Saphenofemoral Junction: No evidence of thrombus. Normal compressibility and flow on color Doppler imaging. Profunda Femoral Vein: No evidence of thrombus. Normal compressibility and flow on color Doppler imaging. Femoral Vein: No evidence of thrombus. Normal compressibility, respiratory phasicity and response to augmentation. Popliteal Vein: No evidence of thrombus. Normal compressibility, respiratory phasicity and response to augmentation. Calf Veins: No evidence of thrombus. Normal compressibility and flow on color Doppler imaging. Limited visualization of the peroneal veins. Superficial Great Saphenous Vein: No evidence of thrombus. Normal compressibility. Other Findings:  Soft tissue edema. LEFT LOWER EXTREMITY Common Femoral Vein: No evidence of thrombus. Normal compressibility, respiratory phasicity and response to augmentation. Saphenofemoral Junction: No evidence of thrombus. Normal compressibility and flow on color Doppler imaging. Profunda Femoral Vein: No evidence of thrombus. Normal compressibility and flow on color Doppler imaging. Femoral Vein: No evidence of thrombus. Normal compressibility, respiratory phasicity and response to augmentation. Popliteal Vein: No evidence of thrombus. Normal compressibility, respiratory phasicity and response to augmentation. Calf Veins: No evidence of thrombus. Normal compressibility and flow  on color Doppler imaging. Superficial Great Saphenous Vein: No evidence of thrombus. Normal compressibility. Other Findings:  Soft tissue edema. IMPRESSION: 1. No evidence of deep venous thrombosis. 2. Lower extremity soft tissue edema. Electronically Signed   By: Marcello Moores  Register   On: 09/21/2017 14:52        Discharge Exam: Vitals:   09/23/17 2214 09/24/17 0614  BP: (!) 189/58 (!) 177/50  Pulse: 83 86  Resp: 19 19  Temp: 98.2 F (36.8 C) 98.3 F (36.8 C)  SpO2: 98% 98%   Vitals:   09/23/17 1423 09/23/17 2214 09/24/17 0614 09/24/17 0753  BP:  (!) 163/49 (!) 189/58 (!) 177/50   Pulse: 78 83 86   Resp: 19 19 19    Temp: 98.1 F (36.7 C) 98.2 F (36.8 C) 98.3 F (36.8 C)   TempSrc: Oral Oral Oral   SpO2: 100% 98% 98%   Weight:    62 kg (136 lb 11.2 oz)  Height:        General: Pt is alert, awake, not in acute distress Cardiovascular: RRR, S1/S2 +, no rubs, no gallops Respiratory: CTA bilaterally, no wheezing, no rhonchi Abdominal: Soft, NT, ND, bowel sounds + Extremities: no edema, no cyanosis   The results of significant diagnostics from this hospitalization (including imaging, microbiology, ancillary and laboratory) are listed below for reference.    Significant Diagnostic Studies: Dg Chest 2 View  Result Date: 09/20/2017 CLINICAL DATA:  Acute chest pain for 2 days and cough for 3 weeks. EXAM: CHEST  2 VIEW COMPARISON:  09/03/2017 and prior radiograph FINDINGS: The cardiomediastinal silhouette is unchanged. Slightly increasing small bilateral pleural effusions and bibasilar atelectasis noted. No pneumothorax.  No interval change identified. No acute bony abnormalities noted. IMPRESSION: Small bilateral pleural effusions and bibasilar atelectasis, slightly increased from prior study. Electronically Signed   By: Margarette Canada M.D.   On: 09/20/2017 19:03   Dg Chest 2 View  Result Date: 09/03/2017 CLINICAL DATA:  Cough EXAM: CHEST  2 VIEW COMPARISON:  07/18/2017 chest radiograph. FINDINGS: Stable cardiomediastinal silhouette with normal heart size and aortic atherosclerosis. No pneumothorax. No pleural effusions. Stable chronic mild pleural-parenchymal scarring at the costophrenic angles. Hyperinflated lungs. No pulmonary edema. New patchy opacity at the medial lung bases. IMPRESSION: 1. New patchy opacity at the medial lung bases, which may indicate aspiration and/or pneumonia. Recommend follow-up PA and lateral post treatment chest radiographs in 4-6 weeks. 2. Hyperinflated lungs, suggesting obstructive lung disease.  Electronically Signed   By: Ilona Sorrel M.D.   On: 09/03/2017 21:44   US Venous Img Lower Bilateral  Result Date: 09/21/2017 CLINICAL DATA:  Bilateral lower extremity edema. EXAM: BILATERAL LOWER EXTREMITY VENOUS DOPPLER ULTRASOUND TECHNIQUE: Gray-scale sonography with graded compression, as well as color Doppler and duplex ultrasound were performed to evaluate the lower extremity deep venous systems from the level of the common femoral vein and including the common femoral, femoral, profunda femoral, popliteal and calf veins including the posterior tibial, peroneal and gastrocnemius veins when visible. The superficial great saphenous vein was also interrogated. Spectral Doppler was utilized to evaluate flow at rest and with distal augmentation maneuvers in the common femoral, femoral and popliteal veins. COMPARISON:  CT 03/04/2017. FINDINGS: RIGHT LOWER EXTREMITY Common Femoral Vein: No evidence of thrombus. Normal compressibility, respiratory phasicity and response to augmentation. Saphenofemoral Junction: No evidence of thrombus. Normal compressibility and flow on color Doppler imaging. Profunda Femoral Vein: No evidence of thrombus. Normal compressibility and flow on color Doppler imaging. Femoral Vein: No  evidence of thrombus. Normal compressibility, respiratory phasicity and response to augmentation. Popliteal Vein: No evidence of thrombus. Normal compressibility, respiratory phasicity and response to augmentation. Calf Veins: No evidence of thrombus. Normal compressibility and flow on color Doppler imaging. Limited visualization of the peroneal veins. Superficial Great Saphenous Vein: No evidence of thrombus. Normal compressibility. Other Findings:  Soft tissue edema. LEFT LOWER EXTREMITY Common Femoral Vein: No evidence of thrombus. Normal compressibility, respiratory phasicity and response to augmentation. Saphenofemoral Junction: No evidence of thrombus. Normal compressibility and flow on color  Doppler imaging. Profunda Femoral Vein: No evidence of thrombus. Normal compressibility and flow on color Doppler imaging. Femoral Vein: No evidence of thrombus. Normal compressibility, respiratory phasicity and response to augmentation. Popliteal Vein: No evidence of thrombus. Normal compressibility, respiratory phasicity and response to augmentation. Calf Veins: No evidence of thrombus. Normal compressibility and flow on color Doppler imaging. Superficial Great Saphenous Vein: No evidence of thrombus. Normal compressibility. Other Findings:  Soft tissue edema. IMPRESSION: 1. No evidence of deep venous thrombosis. 2. Lower extremity soft tissue edema. Electronically Signed   By: Marcello Moores  Register   On: 09/21/2017 14:52     Microbiology: Recent Results (from the past 240 hour(s))  Urine culture     Status: Abnormal   Collection Time: 09/20/17  7:35 PM  Result Value Ref Range Status   Specimen Description URINE, RANDOM  Final   Special Requests NONE  Final   Culture 40,000 COLONIES/mL ESCHERICHIA COLI (A)  Final   Report Status 09/23/2017 FINAL  Final   Organism ID, Bacteria ESCHERICHIA COLI (A)  Final      Susceptibility   Escherichia coli - MIC*    AMPICILLIN 8 SENSITIVE Sensitive     CEFAZOLIN <=4 SENSITIVE Sensitive     CEFTRIAXONE <=1 SENSITIVE Sensitive     CIPROFLOXACIN >=4 RESISTANT Resistant     GENTAMICIN <=1 SENSITIVE Sensitive     IMIPENEM <=0.25 SENSITIVE Sensitive     NITROFURANTOIN 32 SENSITIVE Sensitive     TRIMETH/SULFA >=320 RESISTANT Resistant     AMPICILLIN/SULBACTAM 4 SENSITIVE Sensitive     PIP/TAZO <=4 SENSITIVE Sensitive     Extended ESBL NEGATIVE Sensitive     * 40,000 COLONIES/mL ESCHERICHIA COLI     Labs: Basic Metabolic Panel:  Recent Labs Lab 09/20/17 1906 09/21/17 0424 09/22/17 0504 09/23/17 1005 09/24/17 0739  NA 140 140 140 135 141  K 5.4* 4.8 5.2* 5.4* 4.6  CL 109 108 108 101 102  CO2 21* 24 19* 24 26  GLUCOSE 228* 101* 98 175* 163*  BUN 51*  52* 53* 54* 49*  CREATININE 3.68* 3.55* 3.38* 3.60* 3.30*  CALCIUM 9.8 9.8 10.1 9.9 10.6*   Liver Function Tests:  Recent Labs Lab 09/21/17 0424  AST 10*  ALT 8*  ALKPHOS 41  BILITOT 0.5  PROT 6.6  ALBUMIN 3.5   No results for input(s): LIPASE, AMYLASE in the last 168 hours. No results for input(s): AMMONIA in the last 168 hours. CBC:  Recent Labs Lab 09/18/17 1632 09/19/17 1304 09/20/17 1910 09/21/17 0424 09/22/17 0504 09/23/17 1005  WBC 3.2*  --  3.9* 2.7* 3.3* 2.6*  NEUTROABS 1.8  --  2.7 1.3*  --   --   HGB 9.0* 9.0* 9.5* 9.0* 9.0* 7.9*  HCT 27.3*  --  30.6* 28.4* 28.7* 25.3*  MCV 89  --  93.9 93.1 93.5 94.1  PLT 147*  --  100* 128* 137* 141*   Cardiac Enzymes:  Recent Labs Lab 09/20/17 2244  09/21/17 0424 09/21/17 1050 09/22/17 0946  TROPONINI <0.03 <0.03 <0.03 <0.03   BNP: Invalid input(s): POCBNP CBG:  Recent Labs Lab 09/23/17 0747 09/23/17 1237 09/23/17 1640 09/23/17 2141 09/24/17 0751  GLUCAP 99 211* 152* 175* 153*    Time coordinating discharge:  Greater than 30 minutes  Signed:  Phares Zaccone, DO [facility] Pager: [phone] 09/24/2017, 10:43 AM

## 2017-09-24 LAB — BASIC METABOLIC PANEL
ANION GAP: 13 (ref 5–15)
BUN: 49 mg/dL — ABNORMAL HIGH (ref 6–20)
CALCIUM: 10.6 mg/dL — AB (ref 8.9–10.3)
CO2: 26 mmol/L (ref 22–32)
Chloride: 102 mmol/L (ref 101–111)
Creatinine, Ser: 3.3 mg/dL — ABNORMAL HIGH (ref 0.44–1.00)
GFR, EST AFRICAN AMERICAN: 14 mL/min — AB (ref >60)
GFR, EST NON AFRICAN AMERICAN: 12 mL/min — AB (ref >60)
GLUCOSE: 163 mg/dL — AB (ref 65–99)
POTASSIUM: 4.6 mmol/L (ref 3.5–5.1)
Sodium: 141 mmol/L (ref 135–145)

## 2017-09-24 LAB — GLUCOSE, CAPILLARY
GLUCOSE-CAPILLARY: 153 mg/dL — AB (ref 65–99)
Glucose-Capillary: 121 mg/dL — ABNORMAL HIGH (ref 65–99)

## 2017-09-24 MED ORDER — CARVEDILOL 25 MG PO TABS: 25.0000 mg | ORAL_TABLET | Freq: Two times a day (BID) | ORAL | 1 refills | Status: DC

## 2017-09-24 MED ORDER — TORSEMIDE 20 MG PO TABS: 20.0000 mg | ORAL_TABLET | Freq: Two times a day (BID) | ORAL | 1 refills | Status: DC

## 2017-09-24 MED ORDER — CARVEDILOL 12.5 MG PO TABS
12.5000 mg | ORAL_TABLET | Freq: Once | ORAL | Status: AC
  Administered 2017-09-24: 12.5 mg via ORAL
  Filled 2017-09-24: qty 1

## 2017-09-24 MED ORDER — PANTOPRAZOLE SODIUM 40 MG PO TBEC
40.0000 mg | DELAYED_RELEASE_TABLET | Freq: Two times a day (BID) | ORAL | Status: DC
  Administered 2017-09-24: 40 mg via ORAL
  Filled 2017-09-24: qty 1

## 2017-09-24 MED ORDER — PANTOPRAZOLE SODIUM 40 MG PO TBEC: 40.0000 mg | DELAYED_RELEASE_TABLET | Freq: Two times a day (BID) | ORAL | 0 refills | Status: DC

## 2017-09-24 MED ORDER — CARVEDILOL 12.5 MG PO TABS: 25.0000 mg | ORAL_TABLET | Freq: Two times a day (BID) | ORAL | Status: DC

## 2017-09-24 MED ORDER — HYDRALAZINE HCL 50 MG PO TABS: 50.0000 mg | ORAL_TABLET | Freq: Two times a day (BID) | ORAL | 1 refills | Status: DC

## 2017-09-24 MED ORDER — AMLODIPINE BESYLATE 5 MG PO TABS: 5.0000 mg | ORAL_TABLET | Freq: Every day | ORAL | 1 refills | Status: DC

## 2017-09-24 MED ORDER — TORSEMIDE 20 MG PO TABS: 20.0000 mg | ORAL_TABLET | Freq: Two times a day (BID) | ORAL | Status: DC

## 2017-09-24 NOTE — Progress Notes (Signed)
Pt had accidentally removed IV this AM, site is clean and dry.  Reviewed discharge instructions with pt and daughter at bedside.  Answered questions at this time.

## 2017-09-25 DIAGNOSIS — I5033 Acute on chronic diastolic (congestive) heart failure: Secondary | ICD-10-CM | POA: Diagnosis not present

## 2017-09-25 DIAGNOSIS — N183 Chronic kidney disease, stage 3 (moderate): Secondary | ICD-10-CM | POA: Diagnosis not present

## 2017-09-26 ENCOUNTER — Other Ambulatory Visit: Payer: Self-pay

## 2017-09-27 DIAGNOSIS — I2510 Atherosclerotic heart disease of native coronary artery without angina pectoris: Secondary | ICD-10-CM | POA: Diagnosis not present

## 2017-09-27 DIAGNOSIS — Z7982 Long term (current) use of aspirin: Secondary | ICD-10-CM | POA: Diagnosis not present

## 2017-09-27 DIAGNOSIS — I130 Hypertensive heart and chronic kidney disease with heart failure and stage 1 through stage 4 chronic kidney disease, or unspecified chronic kidney disease: Secondary | ICD-10-CM | POA: Diagnosis not present

## 2017-09-27 DIAGNOSIS — E1122 Type 2 diabetes mellitus with diabetic chronic kidney disease: Secondary | ICD-10-CM | POA: Diagnosis not present

## 2017-09-27 DIAGNOSIS — Z794 Long term (current) use of insulin: Secondary | ICD-10-CM | POA: Diagnosis not present

## 2017-09-27 DIAGNOSIS — N184 Chronic kidney disease, stage 4 (severe): Secondary | ICD-10-CM | POA: Diagnosis not present

## 2017-09-27 DIAGNOSIS — I5023 Acute on chronic systolic (congestive) heart failure: Secondary | ICD-10-CM | POA: Diagnosis not present

## 2017-09-27 DIAGNOSIS — E785 Hyperlipidemia, unspecified: Secondary | ICD-10-CM | POA: Diagnosis not present

## 2017-09-30 DIAGNOSIS — Z794 Long term (current) use of insulin: Secondary | ICD-10-CM | POA: Diagnosis not present

## 2017-09-30 DIAGNOSIS — N184 Chronic kidney disease, stage 4 (severe): Secondary | ICD-10-CM | POA: Diagnosis not present

## 2017-09-30 DIAGNOSIS — I2510 Atherosclerotic heart disease of native coronary artery without angina pectoris: Secondary | ICD-10-CM | POA: Diagnosis not present

## 2017-09-30 DIAGNOSIS — I130 Hypertensive heart and chronic kidney disease with heart failure and stage 1 through stage 4 chronic kidney disease, or unspecified chronic kidney disease: Secondary | ICD-10-CM | POA: Diagnosis not present

## 2017-09-30 DIAGNOSIS — I5023 Acute on chronic systolic (congestive) heart failure: Secondary | ICD-10-CM | POA: Diagnosis not present

## 2017-09-30 DIAGNOSIS — E785 Hyperlipidemia, unspecified: Secondary | ICD-10-CM | POA: Diagnosis not present

## 2017-09-30 DIAGNOSIS — Z7982 Long term (current) use of aspirin: Secondary | ICD-10-CM | POA: Diagnosis not present

## 2017-09-30 DIAGNOSIS — E1122 Type 2 diabetes mellitus with diabetic chronic kidney disease: Secondary | ICD-10-CM | POA: Diagnosis not present

## 2017-10-02 DIAGNOSIS — I5023 Acute on chronic systolic (congestive) heart failure: Secondary | ICD-10-CM | POA: Diagnosis not present

## 2017-10-02 DIAGNOSIS — I2510 Atherosclerotic heart disease of native coronary artery without angina pectoris: Secondary | ICD-10-CM | POA: Diagnosis not present

## 2017-10-02 DIAGNOSIS — N184 Chronic kidney disease, stage 4 (severe): Secondary | ICD-10-CM | POA: Diagnosis not present

## 2017-10-02 DIAGNOSIS — I130 Hypertensive heart and chronic kidney disease with heart failure and stage 1 through stage 4 chronic kidney disease, or unspecified chronic kidney disease: Secondary | ICD-10-CM | POA: Diagnosis not present

## 2017-10-02 DIAGNOSIS — E1122 Type 2 diabetes mellitus with diabetic chronic kidney disease: Secondary | ICD-10-CM | POA: Diagnosis not present

## 2017-10-02 DIAGNOSIS — E785 Hyperlipidemia, unspecified: Secondary | ICD-10-CM | POA: Diagnosis not present

## 2017-10-02 DIAGNOSIS — Z7982 Long term (current) use of aspirin: Secondary | ICD-10-CM | POA: Diagnosis not present

## 2017-10-02 DIAGNOSIS — Z794 Long term (current) use of insulin: Secondary | ICD-10-CM | POA: Diagnosis not present

## 2017-10-02 DIAGNOSIS — G4489 Other headache syndrome: Secondary | ICD-10-CM | POA: Diagnosis not present

## 2017-10-03 ENCOUNTER — Encounter (HOSPITAL_COMMUNITY)
Admission: RE | Admit: 2017-10-03 | Discharge: 2017-10-03 | Disposition: A | Discharge status: Home / Self Care | Payer: Medicare Other | Source: Ambulatory Visit | Attending: Nephrology | Admitting: Nephrology

## 2017-10-03 ENCOUNTER — Encounter (HOSPITAL_COMMUNITY): Payer: Self-pay

## 2017-10-03 DIAGNOSIS — N184 Chronic kidney disease, stage 4 (severe): Secondary | ICD-10-CM | POA: Diagnosis not present

## 2017-10-03 DIAGNOSIS — D631 Anemia in chronic kidney disease: Secondary | ICD-10-CM | POA: Insufficient documentation

## 2017-10-03 LAB — POCT HEMOGLOBIN-HEMACUE: Hemoglobin: 8.3 g/dL — ABNORMAL LOW (ref 12.0–15.0)

## 2017-10-03 MED ORDER — EPOETIN ALFA 3000 UNIT/ML IJ SOLN
3000.0000 [IU] | Freq: Once | INTRAMUSCULAR | Status: AC
  Administered 2017-10-03: 3000 [IU] via SUBCUTANEOUS

## 2017-10-03 MED ORDER — EPOETIN ALFA 3000 UNIT/ML IJ SOLN
INTRAMUSCULAR | Status: AC
  Filled 2017-10-03: qty 2

## 2017-10-03 NOTE — Progress Notes (Signed)
Results for LESHAY, DESAULNIERS (MRN [id number]) as of 10/03/2017 12:31  6000 units of Procrit given as indicated, next appointment 10/17/2017 @ 1315   Ref. Range 10/03/2017 12:31  Hemoglobin Latest Ref Range: 12.0 - 15.0 g/dL 8.3 (L)

## 2017-10-04 ENCOUNTER — Other Ambulatory Visit: Payer: Self-pay | Admitting: Family Medicine

## 2017-10-04 ENCOUNTER — Other Ambulatory Visit: Payer: Self-pay | Admitting: *Deleted

## 2017-10-04 NOTE — Patient Outreach (Signed)
[facility]) Care Management  10/04/2017  [name] [birth year] [id number]  Referral via Patient Engagement Tool:  [place] [employer]:  Telephone call to patient who was advised of [facility] care management services. HIPPA verification received from patient.   Patient voices that she currently lives with her daughter who is her caregiver and also is a Automotive engineer.  Patient voices that she is independent with her personal care & uses a walker to get around.  States her daughter manages her medications & makes sure she is taking medications as prescribed by her MD. States she no problems obtaining her medications.  States her daughter takes her to all of her doctors' appointments. Next appointment is in several days with primary care provider. States she was in hospital recently with pneumonia. States she is doing well & working to get stronger. States she has home health services working with her. States she is not using oxygen & has not had trouble breathing. States if she has health problems her daughter knows what to do and will call MD office. If problem is severe states daughter will call 911.   Voices that she weighs daily & daughter/caregiver knows when to call MD office.  States she currently does not have health care concerns. States daughter takes care of things if there are concerns.   Patient declined [facility] care management services. Consents to receive [facility] contact information to use if needed.   Plan: Send [facility] contact information to patient. Send to care management assistant to close case.   Sherrin Daisy, RN BSN [place] Management Coordinator [facility] Care Management  [phone]

## 2017-10-05 DIAGNOSIS — N184 Chronic kidney disease, stage 4 (severe): Secondary | ICD-10-CM | POA: Diagnosis not present

## 2017-10-05 DIAGNOSIS — E1122 Type 2 diabetes mellitus with diabetic chronic kidney disease: Secondary | ICD-10-CM | POA: Diagnosis not present

## 2017-10-05 DIAGNOSIS — E785 Hyperlipidemia, unspecified: Secondary | ICD-10-CM | POA: Diagnosis not present

## 2017-10-05 DIAGNOSIS — I5023 Acute on chronic systolic (congestive) heart failure: Secondary | ICD-10-CM | POA: Diagnosis not present

## 2017-10-05 DIAGNOSIS — I130 Hypertensive heart and chronic kidney disease with heart failure and stage 1 through stage 4 chronic kidney disease, or unspecified chronic kidney disease: Secondary | ICD-10-CM | POA: Diagnosis not present

## 2017-10-05 DIAGNOSIS — Z794 Long term (current) use of insulin: Secondary | ICD-10-CM | POA: Diagnosis not present

## 2017-10-05 DIAGNOSIS — I2510 Atherosclerotic heart disease of native coronary artery without angina pectoris: Secondary | ICD-10-CM | POA: Diagnosis not present

## 2017-10-05 DIAGNOSIS — Z7982 Long term (current) use of aspirin: Secondary | ICD-10-CM | POA: Diagnosis not present

## 2017-10-06 DIAGNOSIS — E785 Hyperlipidemia, unspecified: Secondary | ICD-10-CM | POA: Diagnosis not present

## 2017-10-06 DIAGNOSIS — N184 Chronic kidney disease, stage 4 (severe): Secondary | ICD-10-CM | POA: Diagnosis not present

## 2017-10-06 DIAGNOSIS — Z7982 Long term (current) use of aspirin: Secondary | ICD-10-CM | POA: Diagnosis not present

## 2017-10-06 DIAGNOSIS — Z794 Long term (current) use of insulin: Secondary | ICD-10-CM | POA: Diagnosis not present

## 2017-10-06 DIAGNOSIS — I130 Hypertensive heart and chronic kidney disease with heart failure and stage 1 through stage 4 chronic kidney disease, or unspecified chronic kidney disease: Secondary | ICD-10-CM | POA: Diagnosis not present

## 2017-10-06 DIAGNOSIS — I5023 Acute on chronic systolic (congestive) heart failure: Secondary | ICD-10-CM | POA: Diagnosis not present

## 2017-10-06 DIAGNOSIS — I2510 Atherosclerotic heart disease of native coronary artery without angina pectoris: Secondary | ICD-10-CM | POA: Diagnosis not present

## 2017-10-06 DIAGNOSIS — E1122 Type 2 diabetes mellitus with diabetic chronic kidney disease: Secondary | ICD-10-CM | POA: Diagnosis not present

## 2017-10-09 ENCOUNTER — Ambulatory Visit (INDEPENDENT_AMBULATORY_CARE_PROVIDER_SITE_OTHER): Payer: Medicare Other | Admitting: Family Medicine

## 2017-10-09 ENCOUNTER — Encounter: Payer: Self-pay | Admitting: *Deleted

## 2017-10-09 ENCOUNTER — Encounter: Payer: Self-pay | Admitting: Family Medicine

## 2017-10-09 VITALS — BP 134/59 | HR 53 | Temp 96.5°F | Ht 63.0 in | Wt 143.2 lb

## 2017-10-09 DIAGNOSIS — Z79899 Other long term (current) drug therapy: Secondary | ICD-10-CM | POA: Diagnosis not present

## 2017-10-09 DIAGNOSIS — R443 Hallucinations, unspecified: Secondary | ICD-10-CM | POA: Diagnosis not present

## 2017-10-09 DIAGNOSIS — I5032 Chronic diastolic (congestive) heart failure: Secondary | ICD-10-CM | POA: Diagnosis not present

## 2017-10-09 DIAGNOSIS — M159 Polyosteoarthritis, unspecified: Secondary | ICD-10-CM

## 2017-10-09 DIAGNOSIS — D509 Iron deficiency anemia, unspecified: Secondary | ICD-10-CM | POA: Diagnosis not present

## 2017-10-09 DIAGNOSIS — M150 Primary generalized (osteo)arthritis: Secondary | ICD-10-CM

## 2017-10-09 DIAGNOSIS — I10 Essential (primary) hypertension: Secondary | ICD-10-CM | POA: Diagnosis not present

## 2017-10-09 DIAGNOSIS — N183 Chronic kidney disease, stage 3 (moderate): Secondary | ICD-10-CM | POA: Diagnosis not present

## 2017-10-09 DIAGNOSIS — E559 Vitamin D deficiency, unspecified: Secondary | ICD-10-CM | POA: Diagnosis not present

## 2017-10-09 MED ORDER — HYDROCODONE-ACETAMINOPHEN 5-325 MG PO TABS: 0.5000 | ORAL_TABLET | Freq: Four times a day (QID) | ORAL | 0 refills | Status: DC | PRN

## 2017-10-09 NOTE — Progress Notes (Signed)
   HPI  Patient presents today for hospital follow-up  Patient explains that she feels somewhat better since leaving the hospital.  Daughter meets me outside of the room and states that she has had more problems recently with seeing things when she first wakes up especially. She explains that she has been saying that the baby is their grandchildren are there that are not she has not noticed any memory loss.  She states that she has been a little bit better since leaving the hospital.  Doing well with the fluid restriction.  She reports good diuresis with torsemide. She states breathing is a little bit better.  She reports good medication compliance, although she complains of taking so many pills.  She requests a refill of hydrocodone for right low back pain.  This is been going on for a while.  Her last prescription was in January of this year, 11 months ago.  She takes 1/2-1 pill occasionally.  PMH: Smoking status noted ROS: Per HPI  Objective: BP (!) 134/59   Pulse (!) 53   Temp (!) 96.5 F (35.8 C) (Oral)   Ht '5\' 3"'$  (1.6 m)   Wt 143 lb 3.2 oz (65 kg)   BMI 25.37 kg/m  Gen: NAD, alert, cooperative with exam HEENT: NCAT CV: RRR, good S1/S2, no murmur Resp: CTABL, no wheezes, non-labored Ext: Trace to 1+ pitting edema laterally Neuro: Alert and oriented, No gross deficits  Assessment and plan:  #Diastolic CHF Volume status slightly up, however close to her dry weight of 140 pounds. Good diuresis, good clients with fluid restriction. Continue torsemide at current dose Repeat BMP, has follow-up with nephro tomorrow Balancing diuresis with chronic kidney disease  #Hypertension Well-controlled No changes to medications, meds titrated in the hospital Appreciate Hospitalists' extra attention.   #Hallucinations Possibly acute illness related, patient has improved since leaving the hospital. Discussed possible medications and discussed that with antipsychotics or atypical  antipsychotics there is an increased risk of all cause mortality. Continue to monitor  #Osteoarthritis of multiple joints Patient complaining primarily of low back pain today, she states that this is not much worse than usual, however hydrocodone has been helpful to have on hand for severe episodes. Refilled    Orders Placed This Encounter  Procedures  . CMP14+EGFR  . CBC with Differential/Platelet    Meds ordered this encounter  Medications  . HYDROcodone-acetaminophen (NORCO) 5-325 MG tablet    Sig: Take 0.5-1 tablets every 6 (six) hours as needed by mouth for moderate pain.    Dispense:  30 tablet    Refill:  0    For chronic back pain    Laroy Apple, MD [place] 10/09/2017, 4:38 PM

## 2017-10-09 NOTE — Patient Instructions (Signed)
Great to see you!   

## 2017-10-10 DIAGNOSIS — I2510 Atherosclerotic heart disease of native coronary artery without angina pectoris: Secondary | ICD-10-CM | POA: Diagnosis not present

## 2017-10-10 DIAGNOSIS — E785 Hyperlipidemia, unspecified: Secondary | ICD-10-CM | POA: Diagnosis not present

## 2017-10-10 DIAGNOSIS — N184 Chronic kidney disease, stage 4 (severe): Secondary | ICD-10-CM | POA: Diagnosis not present

## 2017-10-10 DIAGNOSIS — E1122 Type 2 diabetes mellitus with diabetic chronic kidney disease: Secondary | ICD-10-CM | POA: Diagnosis not present

## 2017-10-10 DIAGNOSIS — Z794 Long term (current) use of insulin: Secondary | ICD-10-CM | POA: Diagnosis not present

## 2017-10-10 DIAGNOSIS — I130 Hypertensive heart and chronic kidney disease with heart failure and stage 1 through stage 4 chronic kidney disease, or unspecified chronic kidney disease: Secondary | ICD-10-CM | POA: Diagnosis not present

## 2017-10-10 DIAGNOSIS — Z7982 Long term (current) use of aspirin: Secondary | ICD-10-CM | POA: Diagnosis not present

## 2017-10-10 DIAGNOSIS — I5023 Acute on chronic systolic (congestive) heart failure: Secondary | ICD-10-CM | POA: Diagnosis not present

## 2017-10-10 LAB — CMP14+EGFR
ALBUMIN: 3.8 g/dL (ref 3.5–4.7)
ALK PHOS: 47 IU/L (ref 39–117)
ALT: 8 IU/L (ref 0–32)
AST: 9 IU/L (ref 0–40)
Albumin/Globulin Ratio: 1.6 (ref 1.2–2.2)
BUN/Creatinine Ratio: 13 (ref 12–28)
BUN: 58 mg/dL — AB (ref 8–27)
Bilirubin Total: 0.2 mg/dL (ref 0.0–1.2)
CO2: 23 mmol/L (ref 20–29)
CREATININE: 4.39 mg/dL — AB (ref 0.57–1.00)
Calcium: 9.5 mg/dL (ref 8.7–10.3)
Chloride: 103 mmol/L (ref 96–106)
GFR calc Af Amer: 10 mL/min/{1.73_m2} — ABNORMAL LOW (ref >59)
GFR, EST NON AFRICAN AMERICAN: 9 mL/min/{1.73_m2} — AB (ref >59)
GLOBULIN, TOTAL: 2.4 g/dL (ref 1.5–4.5)
GLUCOSE: 161 mg/dL — AB (ref 65–99)
Potassium: 4.9 mmol/L (ref 3.5–5.2)
SODIUM: 143 mmol/L (ref 134–144)
Total Protein: 6.2 g/dL (ref 6.0–8.5)

## 2017-10-10 LAB — CBC WITH DIFFERENTIAL/PLATELET
BASOS: 1 %
Basophils Absolute: 0 10*3/uL (ref 0.0–0.2)
EOS (ABSOLUTE): 0.2 10*3/uL (ref 0.0–0.4)
EOS: 5 %
HEMATOCRIT: 24.6 % — AB (ref 34.0–46.6)
HEMOGLOBIN: 8.2 g/dL — AB (ref 11.1–15.9)
IMMATURE GRANS (ABS): 0 10*3/uL (ref 0.0–0.1)
IMMATURE GRANULOCYTES: 1 %
Lymphocytes Absolute: 1 10*3/uL (ref 0.7–3.1)
Lymphs: 27 %
MCH: 30 pg (ref 26.6–33.0)
MCHC: 33.3 g/dL (ref 31.5–35.7)
MCV: 90 fL (ref 79–97)
MONOCYTES: 10 %
Monocytes Absolute: 0.4 10*3/uL (ref 0.1–0.9)
NEUTROS PCT: 56 %
Neutrophils Absolute: 2 10*3/uL (ref 1.4–7.0)
Platelets: 209 10*3/uL (ref 150–379)
RBC: 2.73 x10E6/uL — AB (ref 3.77–5.28)
RDW: 15 % (ref 12.3–15.4)
WBC: 3.5 10*3/uL (ref 3.4–10.8)

## 2017-10-11 DIAGNOSIS — I130 Hypertensive heart and chronic kidney disease with heart failure and stage 1 through stage 4 chronic kidney disease, or unspecified chronic kidney disease: Secondary | ICD-10-CM | POA: Diagnosis not present

## 2017-10-11 DIAGNOSIS — I5023 Acute on chronic systolic (congestive) heart failure: Secondary | ICD-10-CM | POA: Diagnosis not present

## 2017-10-11 DIAGNOSIS — Z7982 Long term (current) use of aspirin: Secondary | ICD-10-CM | POA: Diagnosis not present

## 2017-10-11 DIAGNOSIS — E785 Hyperlipidemia, unspecified: Secondary | ICD-10-CM | POA: Diagnosis not present

## 2017-10-11 DIAGNOSIS — Z794 Long term (current) use of insulin: Secondary | ICD-10-CM | POA: Diagnosis not present

## 2017-10-11 DIAGNOSIS — N184 Chronic kidney disease, stage 4 (severe): Secondary | ICD-10-CM | POA: Diagnosis not present

## 2017-10-11 DIAGNOSIS — I2510 Atherosclerotic heart disease of native coronary artery without angina pectoris: Secondary | ICD-10-CM | POA: Diagnosis not present

## 2017-10-11 DIAGNOSIS — E1122 Type 2 diabetes mellitus with diabetic chronic kidney disease: Secondary | ICD-10-CM | POA: Diagnosis not present

## 2017-10-12 ENCOUNTER — Other Ambulatory Visit: Payer: Self-pay | Admitting: Family Medicine

## 2017-10-12 DIAGNOSIS — I5023 Acute on chronic systolic (congestive) heart failure: Secondary | ICD-10-CM | POA: Diagnosis not present

## 2017-10-12 DIAGNOSIS — N184 Chronic kidney disease, stage 4 (severe): Secondary | ICD-10-CM | POA: Diagnosis not present

## 2017-10-12 DIAGNOSIS — Z794 Long term (current) use of insulin: Secondary | ICD-10-CM | POA: Diagnosis not present

## 2017-10-12 DIAGNOSIS — E785 Hyperlipidemia, unspecified: Secondary | ICD-10-CM | POA: Diagnosis not present

## 2017-10-12 DIAGNOSIS — I2510 Atherosclerotic heart disease of native coronary artery without angina pectoris: Secondary | ICD-10-CM | POA: Diagnosis not present

## 2017-10-12 DIAGNOSIS — I130 Hypertensive heart and chronic kidney disease with heart failure and stage 1 through stage 4 chronic kidney disease, or unspecified chronic kidney disease: Secondary | ICD-10-CM | POA: Diagnosis not present

## 2017-10-12 DIAGNOSIS — Z7982 Long term (current) use of aspirin: Secondary | ICD-10-CM | POA: Diagnosis not present

## 2017-10-12 DIAGNOSIS — E1122 Type 2 diabetes mellitus with diabetic chronic kidney disease: Secondary | ICD-10-CM | POA: Diagnosis not present

## 2017-10-15 ENCOUNTER — Encounter (HOSPITAL_COMMUNITY): Payer: Self-pay

## 2017-10-15 ENCOUNTER — Emergency Department (HOSPITAL_COMMUNITY): Payer: Medicare Other

## 2017-10-15 ENCOUNTER — Other Ambulatory Visit: Payer: Self-pay

## 2017-10-15 ENCOUNTER — Inpatient Hospital Stay (HOSPITAL_COMMUNITY)
Admission: EM | Admit: 2017-10-15 | Discharge: 2017-10-18 | DRG: 291 | Disposition: A | Discharge status: Home Health | Payer: Medicare Other | Attending: Internal Medicine | Admitting: Internal Medicine

## 2017-10-15 DIAGNOSIS — R51 Headache: Secondary | ICD-10-CM | POA: Diagnosis not present

## 2017-10-15 DIAGNOSIS — K224 Dyskinesia of esophagus: Secondary | ICD-10-CM | POA: Diagnosis present

## 2017-10-15 DIAGNOSIS — R519 Headache, unspecified: Secondary | ICD-10-CM

## 2017-10-15 DIAGNOSIS — Z9861 Coronary angioplasty status: Secondary | ICD-10-CM

## 2017-10-15 DIAGNOSIS — B9620 Unspecified Escherichia coli [E. coli] as the cause of diseases classified elsewhere: Secondary | ICD-10-CM | POA: Diagnosis present

## 2017-10-15 DIAGNOSIS — E039 Hypothyroidism, unspecified: Secondary | ICD-10-CM | POA: Diagnosis not present

## 2017-10-15 DIAGNOSIS — N179 Acute kidney failure, unspecified: Secondary | ICD-10-CM | POA: Diagnosis present

## 2017-10-15 DIAGNOSIS — Z79899 Other long term (current) drug therapy: Secondary | ICD-10-CM

## 2017-10-15 DIAGNOSIS — Z8673 Personal history of transient ischemic attack (TIA), and cerebral infarction without residual deficits: Secondary | ICD-10-CM | POA: Diagnosis not present

## 2017-10-15 DIAGNOSIS — I509 Heart failure, unspecified: Secondary | ICD-10-CM | POA: Diagnosis not present

## 2017-10-15 DIAGNOSIS — Z794 Long term (current) use of insulin: Secondary | ICD-10-CM

## 2017-10-15 DIAGNOSIS — B961 Klebsiella pneumoniae [K. pneumoniae] as the cause of diseases classified elsewhere: Secondary | ICD-10-CM | POA: Diagnosis present

## 2017-10-15 DIAGNOSIS — N184 Chronic kidney disease, stage 4 (severe): Secondary | ICD-10-CM | POA: Diagnosis not present

## 2017-10-15 DIAGNOSIS — E1122 Type 2 diabetes mellitus with diabetic chronic kidney disease: Secondary | ICD-10-CM | POA: Diagnosis not present

## 2017-10-15 DIAGNOSIS — I2510 Atherosclerotic heart disease of native coronary artery without angina pectoris: Secondary | ICD-10-CM | POA: Diagnosis not present

## 2017-10-15 DIAGNOSIS — Z66 Do not resuscitate: Secondary | ICD-10-CM | POA: Diagnosis present

## 2017-10-15 DIAGNOSIS — I132 Hypertensive heart and chronic kidney disease with heart failure and with stage 5 chronic kidney disease, or end stage renal disease: Secondary | ICD-10-CM | POA: Diagnosis not present

## 2017-10-15 DIAGNOSIS — M1990 Unspecified osteoarthritis, unspecified site: Secondary | ICD-10-CM | POA: Diagnosis not present

## 2017-10-15 DIAGNOSIS — R112 Nausea with vomiting, unspecified: Secondary | ICD-10-CM

## 2017-10-15 DIAGNOSIS — N3000 Acute cystitis without hematuria: Secondary | ICD-10-CM | POA: Diagnosis present

## 2017-10-15 DIAGNOSIS — G9340 Encephalopathy, unspecified: Secondary | ICD-10-CM | POA: Diagnosis not present

## 2017-10-15 DIAGNOSIS — N185 Chronic kidney disease, stage 5: Secondary | ICD-10-CM | POA: Diagnosis not present

## 2017-10-15 DIAGNOSIS — E118 Type 2 diabetes mellitus with unspecified complications: Secondary | ICD-10-CM | POA: Diagnosis not present

## 2017-10-15 DIAGNOSIS — I5032 Chronic diastolic (congestive) heart failure: Secondary | ICD-10-CM | POA: Diagnosis present

## 2017-10-15 DIAGNOSIS — I480 Paroxysmal atrial fibrillation: Secondary | ICD-10-CM | POA: Diagnosis not present

## 2017-10-15 DIAGNOSIS — I5033 Acute on chronic diastolic (congestive) heart failure: Secondary | ICD-10-CM | POA: Diagnosis not present

## 2017-10-15 DIAGNOSIS — I4729 Other ventricular tachycardia: Secondary | ICD-10-CM

## 2017-10-15 DIAGNOSIS — E7800 Pure hypercholesterolemia, unspecified: Secondary | ICD-10-CM | POA: Diagnosis present

## 2017-10-15 DIAGNOSIS — F0390 Unspecified dementia without behavioral disturbance: Secondary | ICD-10-CM | POA: Diagnosis present

## 2017-10-15 DIAGNOSIS — E860 Dehydration: Secondary | ICD-10-CM

## 2017-10-15 DIAGNOSIS — Z5320 Procedure and treatment not carried out because of patient's decision for unspecified reasons: Secondary | ICD-10-CM | POA: Diagnosis not present

## 2017-10-15 DIAGNOSIS — IMO0001 Reserved for inherently not codable concepts without codable children: Secondary | ICD-10-CM

## 2017-10-15 DIAGNOSIS — D631 Anemia in chronic kidney disease: Secondary | ICD-10-CM

## 2017-10-15 DIAGNOSIS — N390 Urinary tract infection, site not specified: Secondary | ICD-10-CM | POA: Diagnosis present

## 2017-10-15 DIAGNOSIS — Z7982 Long term (current) use of aspirin: Secondary | ICD-10-CM | POA: Diagnosis not present

## 2017-10-15 DIAGNOSIS — R079 Chest pain, unspecified: Secondary | ICD-10-CM | POA: Diagnosis not present

## 2017-10-15 DIAGNOSIS — R1310 Dysphagia, unspecified: Secondary | ICD-10-CM | POA: Diagnosis not present

## 2017-10-15 DIAGNOSIS — R1314 Dysphagia, pharyngoesophageal phase: Secondary | ICD-10-CM | POA: Diagnosis not present

## 2017-10-15 DIAGNOSIS — I472 Ventricular tachycardia: Secondary | ICD-10-CM | POA: Diagnosis present

## 2017-10-15 DIAGNOSIS — M150 Primary generalized (osteo)arthritis: Secondary | ICD-10-CM

## 2017-10-15 LAB — CBC WITH DIFFERENTIAL/PLATELET
BASOS ABS: 0 10*3/uL (ref 0.0–0.1)
Basophils Relative: 0 %
EOS PCT: 4 %
Eosinophils Absolute: 0.1 10*3/uL (ref 0.0–0.7)
HCT: 27.5 % — ABNORMAL LOW (ref 36.0–46.0)
HEMOGLOBIN: 8.3 g/dL — AB (ref 12.0–15.0)
LYMPHS ABS: 0.9 10*3/uL (ref 0.7–4.0)
LYMPHS PCT: 28 %
MCH: 29.2 pg (ref 26.0–34.0)
MCHC: 30.2 g/dL (ref 30.0–36.0)
MCV: 96.8 fL (ref 78.0–100.0)
MONOS PCT: 10 %
Monocytes Absolute: 0.3 10*3/uL (ref 0.1–1.0)
NEUTROS PCT: 58 %
Neutro Abs: 1.9 10*3/uL (ref 1.7–7.7)
Platelets: 159 10*3/uL (ref 150–400)
RBC: 2.84 MIL/uL — AB (ref 3.87–5.11)
RDW: 14.5 % (ref 11.5–15.5)
WBC: 3.2 10*3/uL — AB (ref 4.0–10.5)

## 2017-10-15 LAB — COMPREHENSIVE METABOLIC PANEL
ALBUMIN: 3.9 g/dL (ref 3.5–5.0)
ALT: 8 U/L — AB (ref 14–54)
AST: 9 U/L — AB (ref 15–41)
Alkaline Phosphatase: 41 U/L (ref 38–126)
Anion gap: 11 (ref 5–15)
BUN: 64 mg/dL — AB (ref 6–20)
CHLORIDE: 106 mmol/L (ref 101–111)
CO2: 23 mmol/L (ref 22–32)
CREATININE: 4.43 mg/dL — AB (ref 0.44–1.00)
Calcium: 9.9 mg/dL (ref 8.9–10.3)
GFR calc Af Amer: 10 mL/min — ABNORMAL LOW (ref >60)
GFR, EST NON AFRICAN AMERICAN: 8 mL/min — AB (ref >60)
Glucose, Bld: 143 mg/dL — ABNORMAL HIGH (ref 65–99)
POTASSIUM: 4.7 mmol/L (ref 3.5–5.1)
SODIUM: 140 mmol/L (ref 135–145)
Total Bilirubin: 0.4 mg/dL (ref 0.3–1.2)
Total Protein: 6.7 g/dL (ref 6.5–8.1)

## 2017-10-15 LAB — URINALYSIS, ROUTINE W REFLEX MICROSCOPIC
BILIRUBIN URINE: NEGATIVE
Glucose, UA: NEGATIVE mg/dL
HGB URINE DIPSTICK: NEGATIVE
Ketones, ur: NEGATIVE mg/dL
NITRITE: NEGATIVE
PH: 5 (ref 5.0–8.0)
Protein, ur: 100 mg/dL — AB
SPECIFIC GRAVITY, URINE: 1.012 (ref 1.005–1.030)

## 2017-10-15 LAB — GLUCOSE, CAPILLARY
GLUCOSE-CAPILLARY: 95 mg/dL (ref 65–99)
Glucose-Capillary: 120 mg/dL — ABNORMAL HIGH (ref 65–99)

## 2017-10-15 LAB — TROPONIN I

## 2017-10-15 MED ORDER — MORPHINE SULFATE (PF) 4 MG/ML IV SOLN
4.0000 mg | Freq: Once | INTRAVENOUS | Status: AC
  Administered 2017-10-15: 4 mg via INTRAVENOUS
  Filled 2017-10-15: qty 1

## 2017-10-15 MED ORDER — HYDROMORPHONE HCL 1 MG/ML IJ SOLN
1.0000 mg | Freq: Once | INTRAMUSCULAR | Status: AC
  Administered 2017-10-15: 1 mg via INTRAVENOUS
  Filled 2017-10-15: qty 1

## 2017-10-15 MED ORDER — CARVEDILOL 12.5 MG PO TABS
25.0000 mg | ORAL_TABLET | Freq: Two times a day (BID) | ORAL | Status: DC
  Administered 2017-10-15 – 2017-10-18 (×6): 25 mg via ORAL
  Filled 2017-10-15 (×5): qty 2

## 2017-10-15 MED ORDER — PANTOPRAZOLE SODIUM 40 MG PO TBEC
40.0000 mg | DELAYED_RELEASE_TABLET | Freq: Two times a day (BID) | ORAL | Status: DC
  Administered 2017-10-15 – 2017-10-17 (×5): 40 mg via ORAL
  Filled 2017-10-15 (×7): qty 1

## 2017-10-15 MED ORDER — SODIUM CHLORIDE 0.9 % IV SOLN: Freq: Once | INTRAVENOUS | Status: DC

## 2017-10-15 MED ORDER — FUROSEMIDE 10 MG/ML IJ SOLN
40.0000 mg | Freq: Two times a day (BID) | INTRAMUSCULAR | Status: DC
  Administered 2017-10-15 – 2017-10-17 (×5): 40 mg via INTRAVENOUS
  Filled 2017-10-15 (×5): qty 4

## 2017-10-15 MED ORDER — HEPARIN SODIUM (PORCINE) 5000 UNIT/ML IJ SOLN
5000.0000 [IU] | Freq: Three times a day (TID) | INTRAMUSCULAR | Status: DC
  Administered 2017-10-15 – 2017-10-17 (×7): 5000 [IU] via SUBCUTANEOUS
  Filled 2017-10-15 (×7): qty 1

## 2017-10-15 MED ORDER — PROMETHAZINE HCL 25 MG/ML IJ SOLN
12.5000 mg | Freq: Once | INTRAMUSCULAR | Status: AC
  Administered 2017-10-15: 12.5 mg via INTRAVENOUS
  Filled 2017-10-15: qty 1

## 2017-10-15 MED ORDER — CARVEDILOL 12.5 MG PO TABS: 25.0000 mg | ORAL_TABLET | Freq: Two times a day (BID) | ORAL | Status: DC

## 2017-10-15 MED ORDER — LEVOTHYROXINE SODIUM 50 MCG PO TABS
50.0000 ug | ORAL_TABLET | Freq: Every day | ORAL | Status: DC
  Administered 2017-10-16 – 2017-10-18 (×3): 50 ug via ORAL
  Filled 2017-10-15 (×3): qty 1

## 2017-10-15 MED ORDER — INSULIN ASPART 100 UNIT/ML ~~LOC~~ SOLN
0.0000 [IU] | Freq: Three times a day (TID) | SUBCUTANEOUS | Status: DC
  Administered 2017-10-16: 2 [IU] via SUBCUTANEOUS
  Administered 2017-10-16 – 2017-10-17 (×2): 1 [IU] via SUBCUTANEOUS
  Administered 2017-10-17: 2 [IU] via SUBCUTANEOUS
  Administered 2017-10-17 – 2017-10-18 (×2): 1 [IU] via SUBCUTANEOUS

## 2017-10-15 MED ORDER — ONDANSETRON HCL 4 MG/2ML IJ SOLN
4.0000 mg | Freq: Once | INTRAMUSCULAR | Status: AC
  Administered 2017-10-15: 4 mg via INTRAVENOUS
  Filled 2017-10-15: qty 2

## 2017-10-15 MED ORDER — ISOSORBIDE MONONITRATE ER 60 MG PO TB24
120.0000 mg | ORAL_TABLET | Freq: Every day | ORAL | Status: DC
  Administered 2017-10-16 – 2017-10-18 (×3): 120 mg via ORAL
  Filled 2017-10-15 (×4): qty 2

## 2017-10-15 MED ORDER — HYDRALAZINE HCL 25 MG PO TABS
50.0000 mg | ORAL_TABLET | Freq: Two times a day (BID) | ORAL | Status: DC
  Administered 2017-10-15 – 2017-10-17 (×5): 50 mg via ORAL
  Filled 2017-10-15 (×6): qty 2

## 2017-10-15 MED ORDER — INSULIN ASPART 100 UNIT/ML ~~LOC~~ SOLN: 0.0000 [IU] | Freq: Every day | SUBCUTANEOUS | Status: DC

## 2017-10-15 MED ORDER — DEXTROSE 5 % IV SOLN
2.0000 g | Freq: Once | INTRAVENOUS | Status: AC
  Administered 2017-10-15: 2 g via INTRAVENOUS
  Filled 2017-10-15: qty 2

## 2017-10-15 MED ORDER — DEXTROSE 5 % IV SOLN
1.0000 g | INTRAVENOUS | Status: DC
  Administered 2017-10-16 – 2017-10-17 (×2): 1 g via INTRAVENOUS
  Filled 2017-10-15 (×5): qty 10

## 2017-10-15 NOTE — ED Notes (Signed)
Unable to walk patient at this time due to drowsiness caused by pain medication. EDP made aware.

## 2017-10-15 NOTE — H&P (Addendum)
History and Physical    [name]:[id number] DOB: 1932-07-14 DOA: 10/15/2017    PCP: Timmothy Euler, MD  Patient coming from: home  Chief Complaint: headache  HPI: [name] is a 83 y.o. female with medical history of CAD s/p PCI, grade 2 dCHF, HTN, IDDM 2,CKD 4 who was in the hospital from 10/24-10/28 for acute d CHF comes in for a headache. The patient is a bit confused and unable to tell me why she is here. Per history obtained by ER doctor, she has not been able to sleep due to the headache. I have spoken with the daughter over the phone and she tells me that she has also not been eating and drinking much and had some chest pain yesterday. Her daughter states to me that she ate better today.   Per notes in Epic, she was seen on 11/12 by her PCP at [facility]. Weight was 140 lb. A prescription for Hydrocodone was given for osteoarthritis which she has used in the past. There was a discussion in regards to her having hallucinations which had occurred while in the hospital and had since resolved. Her daughter states that her confusion and occasional hallucinations are nothing new. She sometimes sees her mother sitting in the room and is often more confused after she wakes up.  ED Course:   UA + for UTI, BUN/ CR 64 and 4.43  Wt is 142 (136 when last discharged) CXR > Pulmonary edema  Review of Systems:  All other systems reviewed and apart from HPI, are negative.  Past Medical History:  Diagnosis Date  . CAD (coronary artery disease) 2003   Last catheterization 2008. Two-vessel PCI of the first OM branch and mid LAD.  Marland Kitchen CKD (chronic kidney disease)   . Congestive heart failure ([place]) 02/2017   grade 2 diastolic dysfunction  . CVA (cerebral vascular accident) ([place]) [place]   . Dementia   . DJD (degenerative joint disease) of lumbar spine   . Hypercholesteremia   . Hypertension   . IDDM (insulin dependent diabetes mellitus) ([place])     Past Surgical History:   Procedure Laterality Date  . APPENDECTOMY    . KNEE SURGERY      Social History:   reports that  has never smoked. she has never used smokeless tobacco. She reports that she does not drink alcohol or use drugs.  Allergies  Allergen Reactions  . Propoxyphene N-Acetaminophen     Pt does not know reaction  . Simvastatin Other (See Comments)    headache    Family History  Problem Relation Age of Onset  . Heart attack Mother 65  . Heart attack Father 20  . Diabetes Brother        RETINOPATHY   . Drug abuse Brother   . Liver cancer Brother   . Breast cancer Daughter   . Diabetes Sister      Prior to Admission medications   Medication Sig Start Date End Date Taking? Authorizing Provider  ACCU-CHEK AVIVA PLUS test strip CHECK BLOOD SUGER UP TO 3 TIMES A DAY 09/20/17   Timmothy Euler, MD  amLODipine (NORVASC) 5 MG tablet Take 1 tablet (5 mg total) by mouth daily. 09/24/17 09/24/18  Orson Eva, MD  aspirin EC 81 MG EC tablet Take 1 tablet (81 mg total) by mouth daily. 10/08/16   Bhagat, Crista Luria, PA  carvedilol (COREG) 25 MG tablet Take 1 tablet (25 mg total) by mouth 2 (  two) times daily with a meal. 09/24/17   Tat, Shanon Brow, MD  cholecalciferol (VITAMIN D) 1000 units tablet Take 1,000 Units by mouth daily.    [provider]  diclofenac sodium (VOLTAREN) 1 % GEL Apply 4 g topically 4 (four) times daily. Patient taking differently: Apply 4 g topically 4 (four) times daily as needed (FOR PAIN).  07/29/16   Timmothy Euler, MD  hydrALAZINE (APRESOLINE) 50 MG tablet Take 1 tablet (50 mg total) by mouth 2 (two) times daily. 09/24/17   Orson Eva, MD  HYDROcodone-acetaminophen (NORCO) 5-325 MG tablet Take 0.5-1 tablets every 6 (six) hours as needed by mouth for moderate pain. 10/09/17   Timmothy Euler, MD  insulin NPH Human (HUMULIN N) 100 UNIT/ML injection Inject 0.1 mLs (10 Units total) into the skin 2 (two) times daily before a meal. Patient taking differently: Inject  10 Units into the skin 2 (two) times daily before a meal. GIVES BASED ON BLOOD SUGAR LEVELS. IF UNDER 120-NO INSULIN IS GIVEN 02/03/17   Timmothy Euler, MD  Insulin Syringe-Needle U-100 27G X 1/2" 1 ML MISC Use as needed 02/03/17   Timmothy Euler, MD  isosorbide mononitrate (IMDUR) 120 MG 24 hr tablet TAKE 1 TABLET DAILY 07/07/17   Timmothy Euler, MD  levothyroxine (SYNTHROID, LEVOTHROID) 50 MCG tablet TAKE 1 TABLET DAILY 04/03/17   Timmothy Euler, MD  nitroGLYCERIN (NITROSTAT) 0.4 MG SL tablet PLACE 1 TABLET UNDER THE TONGUE AT ONSET OF CHEST PAIN EVERY 5 MINTUES UP TO 3 TIMES AS NEEDED 02/27/17   Timmothy Euler, MD  pantoprazole (PROTONIX) 40 MG tablet Take 1 tablet (40 mg total) by mouth 2 (two) times daily. X one month, then once daily 09/24/17   Tat, David, MD  polyethylene glycol powder (GLYCOLAX/MIRALAX) powder MIX 1 CAPFUL (17 GRAMS) WITH 8OZ OF WATER OR JUICE DAILY. Patient taking differently: MIX 1 CAPFUL (17 GRAMS) WITH 8OZ OF WATER OR JUICE DAILY AS NEEDED FOR CONSTIPATION 12/13/16   Timmothy Euler, MD  rosuvastatin (CRESTOR) 10 MG tablet TAKE 1 TABLET DAILY 10/13/17   Timmothy Euler, MD  sodium polystyrene (KAYEXALATE) 15 GM/60ML suspension Take 60 mLs (15 g total) by mouth daily. Patient taking differently: Take 15 g by mouth daily as needed.  03/17/17   Timmothy Euler, MD  sucralfate (CARAFATE) 1 GM/10ML suspension Take 10 mLs (1 g total) by mouth every 6 (six) hours. Patient taking differently: Take 1 g by mouth daily as needed.  03/27/17   Timmothy Euler, MD  torsemide (DEMADEX) 20 MG tablet Take 1 tablet (20 mg total) by mouth 2 (two) times daily. 09/24/17   Orson Eva, MD    Physical Exam: Wt Readings from Last 3 Encounters:  10/15/17 64.7 kg (142 lb 9.6 oz)  10/09/17 65 kg (143 lb 3.2 oz)  10/03/17 62.6 kg (138 lb)   Vitals:   10/15/17 1000 10/15/17 1028 10/15/17 1030 10/15/17 1100  BP: (!) 150/67 (!) 150/67 (!) 123/50 107/85  Pulse:  60  (!) 51    Resp: (!) 9 12 (!) 8 12  Temp:      TempSrc:      SpO2:  94%  95%  Weight:      Height:          Constitutional: NAD, calm, comfortable- sleepy- falling asleep sitting up Eyes: PERTLA, lids and conjunctivae normal ENMT: Mucous membranes are moist. Posterior pharynx clear of any exudate or lesions. Normal dentition.  Neck: normal,  supple, no masses, no thyromegaly Respiratory: Crackles in RLL, no wheezing, Normal respiratory effort. No accessory muscle use.  Cardiovascular: S1 & S2 heard, regular rate and rhythm, no murmurs / rubs / gallops. No extremity edema. 2+ pedal pulses. No carotid bruits.  Abdomen: No distension, no tenderness, no masses palpated. No hepatosplenomegaly. Bowel sounds normal.  Musculoskeletal: no clubbing / cyanosis. No joint deformity upper and lower extremities. Good ROM, no contractures. Normal muscle tone.  Skin: no rashes, lesions, ulcers. No induration Neurologic: CN 2-12 grossly intact. Sensation intact, DTR normal. Strength 5/5 in all 4 limbs.  Psychiatric: Alert enough to talk but falls asleep easily- knows she is in the hospital but not which hospital or which town.Thinks she is 83 y/o.   Poor insight into why she is here.     Labs on Admission: I have personally reviewed following labs and imaging studies  CBC: Recent Labs  Lab 10/09/17 1624 10/15/17 0714  WBC 3.5 3.2*  NEUTROABS 2.0 1.9  HGB 8.2* 8.3*  HCT 24.6* 27.5*  MCV 90 96.8  PLT 209 786   Basic Metabolic Panel: Recent Labs  Lab 10/09/17 1624 10/15/17 0714  NA 143 140  K 4.9 4.7  CL 103 106  CO2 23 23  GLUCOSE 161* 143*  BUN 58* 64*  CREATININE 4.39* 4.43*  CALCIUM 9.5 9.9   GFR: Estimated Creatinine Clearance: 8.7 mL/min (A) (by C-G formula based on SCr of 4.43 mg/dL (H)). Liver Function Tests: Recent Labs  Lab 10/09/17 1624 10/15/17 0714  AST 9 9*  ALT 8 8*  ALKPHOS 47 41  BILITOT 0.2 0.4  PROT 6.2 6.7  ALBUMIN 3.8 3.9   No results for input(s): LIPASE,  AMYLASE in the last 168 hours. No results for input(s): AMMONIA in the last 168 hours. Coagulation Profile: No results for input(s): INR, PROTIME in the last 168 hours. Cardiac Enzymes: Recent Labs  Lab 10/15/17 0714  TROPONINI <0.03   BNP (last 3 results) No results for input(s): PROBNP in the last 8760 hours. HbA1C: No results for input(s): HGBA1C in the last 72 hours. CBG: No results for input(s): GLUCAP in the last 168 hours. Lipid Profile: No results for input(s): CHOL, HDL, LDLCALC, TRIG, CHOLHDL, LDLDIRECT in the last 72 hours. Thyroid Function Tests: No results for input(s): TSH, T4TOTAL, FREET4, T3FREE, THYROIDAB in the last 72 hours. Anemia Panel: No results for input(s): VITAMINB12, FOLATE, FERRITIN, TIBC, IRON, RETICCTPCT in the last 72 hours. Urine analysis:    Component Value Date/Time   COLORURINE YELLOW 10/15/2017 0910   APPEARANCEUR CLOUDY (A) 10/15/2017 0910   APPEARANCEUR Cloudy (A) 07/18/2017 1338   LABSPEC 1.012 10/15/2017 0910   PHURINE 5.0 10/15/2017 0910   GLUCOSEU NEGATIVE 10/15/2017 0910   HGBUR NEGATIVE 10/15/2017 0910   BILIRUBINUR NEGATIVE 10/15/2017 0910   BILIRUBINUR Negative 07/18/2017 1338   KETONESUR NEGATIVE 10/15/2017 0910   PROTEINUR 100 (A) 10/15/2017 0910   UROBILINOGEN negative 07/02/2015 1505   UROBILINOGEN 0.2 05/10/2015 1245   NITRITE NEGATIVE 10/15/2017 0910   LEUKOCYTESUR LARGE (A) 10/15/2017 0910   LEUKOCYTESUR 3+ (A) 07/18/2017 1338   Sepsis Labs: @LABRCNTIP (procalcitonin:4,lacticidven:4) )No results found for this or any previous visit (from the past 240 hour(s)).   Radiological Exams on Admission: Dg Chest 2 View  Result Date: 10/15/2017 CLINICAL DATA:  Chest pain. EXAM: CHEST  2 VIEW COMPARISON:  Two-view chest x-ray 09/20/2017 FINDINGS: The heart size is normal. Bilateral pleural effusions are increasing. Bibasilar airspace disease is associated. Pulmonary vascular congestion is  increasing. Scarring is again noted at  the lung apices bilaterally. Aortic atherosclerosis is present. IMPRESSION: 1. Cardiomegaly with increasing pulmonary vascular congestion and bilateral effusions compatible with congestive heart failure. 2. Bibasilar airspace disease likely reflects atelectasis. 3. Aortic atherosclerosis. Electronically Signed   By: San Morelle M.D.   On: 10/15/2017 08:49   Ct Head Wo Contrast  Result Date: 10/15/2017 CLINICAL DATA:  Posterior headache EXAM: CT HEAD WITHOUT CONTRAST TECHNIQUE: Contiguous axial images were obtained from the base of the skull through the vertex without intravenous contrast. COMPARISON:  05/14/2013 FINDINGS: Brain: Mild age related volume loss. No acute intracranial abnormality. Specifically, no hemorrhage, hydrocephalus, mass lesion, acute infarction, or significant intracranial injury. Vascular: No hyperdense vessel or unexpected calcification. Skull: No acute calvarial abnormality. Sinuses/Orbits: Visualized paranasal sinuses and mastoids clear. Orbital soft tissues unremarkable. Other: None IMPRESSION: No acute intracranial abnormality. Electronically Signed   By: Rolm Baptise M.D.   On: 10/15/2017 08:29    EKG: Independently reviewed. Sinus rhythm with RBBB  Assessment/Plan Principal Problem:   Acute on chronic diastolic CHF (congestive heart failure) (HCC)   Acute renal failure superimposed on stage 4 chronic kidney disease (HCC) Essential HTN - dry weight was 136 last month - baseline Cr about 3.3- now 4.43 - give IV Lasix and follow  - cont Carvedilol, Hydralazine, Imdur     Active Problems:  UTI - send U culture- please note that she has been given Ceftriaxone at 10 AM in the ER - has grown out E coli in the past resistant to Flouroquinolones - do I and O cath for clean culture and to assess for U retention   - cont Ceftriaxone  Confusion - per daughter, this is not a new issue- she suspects dementia which I agree with based on history - CT head  unrevealing -given Dilaudid in the ER-  hold narcotics  OA (osteoarthritis) - Tylenol    NSVT (nonsustained ventricular tachycardia)  - Carvedilol    CAD S/P PCI '03 and '08 - ASA, Crestor    Insulin dependent diabetes mellitus with complications  - hold NPH- give SSI    Hypothyroid - cont synthroid   Anemia - last anemia panel in 03/04/17 suggestive of AOCD    DVT prophylaxis: Heparin Code Status: DNR Family Communication: daughter Iline Oven whom patient lives with Disposition Plan: follow on med/surg  Consults called: none  Admission status: observation    Debbe Odea MD [facility] Pager: [email or web address] Password [email or web address] 7PM-7AM, please contact night-coverage   10/15/2017, 1:13 PM

## 2017-10-15 NOTE — ED Provider Notes (Signed)
[facility] EMERGENCY DEPARTMENT Provider Note   CSN: [id number] Arrival date & time: 10/15/17  6720     History   Chief Complaint Chief Complaint  Patient presents with  . Headache    HPI [name] is a 83 y.o. female.  Pt presents to the ED today with headache.  Pt said it started yesterday and got worse over night.  She was unable to sleep due to the pain.  Her family said she has not been eating or drinking much.  Yesterday, pt had cp.  No pain today.  Breathing is ok.  The pt has had nausea, but no vomiting.  The pt was admitted from 10/24-28 for CHF exacerbation.  She did f/u with her doctor on 11/12 and was given a rx for hydrocodone for back pain.  No other med changes.  Pt's daughter said she's been confused as well.      Past Medical History:  Diagnosis Date  . CAD (coronary artery disease) 2003   Last catheterization 2008. Two-vessel PCI of the first OM branch and mid LAD.  Marland Kitchen CKD (chronic kidney disease)   . Congestive heart failure ([place]) 02/2017   grade 2 diastolic dysfunction  . CVA (cerebral vascular accident) ([place]) [place]   . Dementia   . DJD (degenerative joint disease) of lumbar spine   . Hypercholesteremia   . Hypertension   . IDDM (insulin dependent diabetes mellitus) [facility])     Patient Active Problem List   Diagnosis Date Noted  . CHF exacerbation ([place]) 09/23/2017  . Acute renal failure superimposed on stage 4 chronic kidney disease ([place]) 09/22/2017  . CAD (coronary artery disease), native coronary artery 09/21/2017  . Pleural effusion, bilateral   . Acute on chronic diastolic CHF (congestive heart failure) ([place]) 09/20/2017  . Thrombocytopenia ([place]) 09/20/2017  . Abnormal CT scan of lung   . Chest pain 03/04/2017  . Esophagitis   . Occult blood in stools 11/29/2016  . UTI (urinary tract infection) 11/29/2016  . Urinary retention 11/29/2016  . PAF (paroxysmal atrial fibrillation) ([place])   . Pressure injury of skin 11/22/2016    . Aortic valve sclerosis-2D June 2016 10/05/2016  . Chest pain with high risk of acute coronary syndrome 10/05/2016  . 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 Pulmonary hypertension ([place])   . Insulin dependent diabetes mellitus with complications ([place])   . Chronic kidney disease, stage IV (severe) ([place])   . Other constipation   . Failure to thrive in adult   . Hypomagnesemia   . CAD S/P PCI '03 and '08 05/11/2015  . Anemia in chronic kidney disease 05/11/2015  . Benign paroxysmal positional vertigo 03/21/2014  . HTN (hypertension) 08/09/2013  . Lumbar spondylosis 05/16/2013  . Overweight 05/15/2013  . Varicose veins of lower extremities with other complications 94/70/9628  . Normocytic anemia 10/29/2012  . Hypoglycemia 10/29/2012  . NSVT (nonsustained ventricular tachycardia) ([place]) 10/28/2012  . OA (osteoarthritis) 10/27/2012  . DIASTOLIC HEART FAILURE, CHRONIC 07/20/2010  . Cerebrovascular disease 03/09/2010  . Hyperlipidemia 12/05/2008    Past Surgical History:  Procedure Laterality Date  . APPENDECTOMY    . KNEE SURGERY      OB History    No data available       Home Medications    Prior to Admission medications   Medication Sig Start Date End Date Taking? Authorizing Provider  ACCU-CHEK AVIVA PLUS test strip CHECK BLOOD SUGER UP TO 3 TIMES A DAY 09/20/17  Timmothy Euler, MD  amLODipine (NORVASC) 5 MG tablet Take 1 tablet (5 mg total) by mouth daily. 09/24/17 09/24/18  Orson Eva, MD  aspirin EC 81 MG EC tablet Take 1 tablet (81 mg total) by mouth daily. 10/08/16   Bhagat, Crista Luria, PA  carvedilol (COREG) 25 MG tablet Take 1 tablet (25 mg total) by mouth 2 (two) times daily with a meal. 09/24/17   Tat, Shanon Brow, MD  cholecalciferol (VITAMIN D) 1000 units tablet Take 1,000 Units by mouth daily.    [provider]  diclofenac sodium (VOLTAREN) 1 % GEL Apply 4 g topically 4 (four) times daily. Patient taking differently: Apply 4 g topically 4 (four) times  daily as needed (FOR PAIN).  07/29/16   Timmothy Euler, MD  hydrALAZINE (APRESOLINE) 50 MG tablet Take 1 tablet (50 mg total) by mouth 2 (two) times daily. 09/24/17   Orson Eva, MD  HYDROcodone-acetaminophen (NORCO) 5-325 MG tablet Take 0.5-1 tablets every 6 (six) hours as needed by mouth for moderate pain. 10/09/17   Timmothy Euler, MD  insulin NPH Human (HUMULIN N) 100 UNIT/ML injection Inject 0.1 mLs (10 Units total) into the skin 2 (two) times daily before a meal. Patient taking differently: Inject 10 Units into the skin 2 (two) times daily before a meal. GIVES BASED ON BLOOD SUGAR LEVELS. IF UNDER 120-NO INSULIN IS GIVEN 02/03/17   Timmothy Euler, MD  Insulin Syringe-Needle U-100 27G X 1/2" 1 ML MISC Use as needed 02/03/17   Timmothy Euler, MD  isosorbide mononitrate (IMDUR) 120 MG 24 hr tablet TAKE 1 TABLET DAILY 07/07/17   Timmothy Euler, MD  levothyroxine (SYNTHROID, LEVOTHROID) 50 MCG tablet TAKE 1 TABLET DAILY 04/03/17   Timmothy Euler, MD  nitroGLYCERIN (NITROSTAT) 0.4 MG SL tablet PLACE 1 TABLET UNDER THE TONGUE AT ONSET OF CHEST PAIN EVERY 5 MINTUES UP TO 3 TIMES AS NEEDED 02/27/17   Timmothy Euler, MD  pantoprazole (PROTONIX) 40 MG tablet Take 1 tablet (40 mg total) by mouth 2 (two) times daily. X one month, then once daily 09/24/17   Tat, David, MD  polyethylene glycol powder (GLYCOLAX/MIRALAX) powder MIX 1 CAPFUL (17 GRAMS) WITH 8OZ OF WATER OR JUICE DAILY. Patient taking differently: MIX 1 CAPFUL (17 GRAMS) WITH 8OZ OF WATER OR JUICE DAILY AS NEEDED FOR CONSTIPATION 12/13/16   Timmothy Euler, MD  rosuvastatin (CRESTOR) 10 MG tablet TAKE 1 TABLET DAILY 10/13/17   Timmothy Euler, MD  sodium polystyrene (KAYEXALATE) 15 GM/60ML suspension Take 60 mLs (15 g total) by mouth daily. Patient taking differently: Take 15 g by mouth daily as needed.  03/17/17   Timmothy Euler, MD  sucralfate (CARAFATE) 1 GM/10ML suspension Take 10 mLs (1 g total) by mouth every 6 (six)  hours. Patient taking differently: Take 1 g by mouth daily as needed.  03/27/17   Timmothy Euler, MD  torsemide (DEMADEX) 20 MG tablet Take 1 tablet (20 mg total) by mouth 2 (two) times daily. 09/24/17   Orson Eva, MD    Family History Family History  Problem Relation Age of Onset  . Heart attack Mother 41  . Heart attack Father 37  . Diabetes Brother        RETINOPATHY   . Drug abuse Brother   . Liver cancer Brother   . Breast cancer Daughter   . Diabetes Sister     Social History Social History   Tobacco Use  . Smoking status: Never  Smoker  . Smokeless tobacco: Never Used  Substance Use Topics  . Alcohol use: No  . Drug use: No     Allergies   Propoxyphene n-acetaminophen and Simvastatin   Review of Systems Review of Systems  Constitutional: Positive for appetite change.  Neurological: Positive for headaches.  All other systems reviewed and are negative.    Physical Exam Updated Vital Signs BP (!) 150/67 (BP Location: Left Arm)   Pulse 60   Temp 98 F (36.7 C)   Resp 12   Ht 5\' 3"  (1.6 m)   Wt 64.7 kg (142 lb 9.6 oz)   SpO2 94%   BMI 25.26 kg/m   Physical Exam  Constitutional: She appears well-developed and well-nourished.  HENT:  Head: Normocephalic and atraumatic.  Mouth/Throat: Oropharynx is clear and moist.  Eyes: EOM are normal. Pupils are equal, round, and reactive to light.  Neck: Normal range of motion. Neck supple.  Cardiovascular: Normal rate, regular rhythm, normal heart sounds and intact distal pulses.  Pulmonary/Chest: Effort normal. She has rales.  Abdominal: Soft. Bowel sounds are normal.  Musculoskeletal: Normal range of motion.  Neurological: She is alert. She has normal strength.  Skin: Skin is warm and dry. Capillary refill takes less than 2 seconds.  Psychiatric: She has a normal mood and affect. Her behavior is normal.  Nursing note and vitals reviewed.    ED Treatments / Results  Labs (all labs ordered are listed,  but only abnormal results are displayed) Labs Reviewed  COMPREHENSIVE METABOLIC PANEL - Abnormal; Notable for the following components:      Result Value   Glucose, Bld 143 (*)    BUN 64 (*)    Creatinine, Ser 4.43 (*)    AST 9 (*)    ALT 8 (*)    GFR calc non Af Amer 8 (*)    GFR calc Af Amer 10 (*)    All other components within normal limits  CBC WITH DIFFERENTIAL/PLATELET - Abnormal; Notable for the following components:   WBC 3.2 (*)    RBC 2.84 (*)    Hemoglobin 8.3 (*)    HCT 27.5 (*)    All other components within normal limits  URINALYSIS, ROUTINE W REFLEX MICROSCOPIC - Abnormal; Notable for the following components:   APPearance CLOUDY (*)    Protein, ur 100 (*)    Leukocytes, UA LARGE (*)    Bacteria, UA MANY (*)    Squamous Epithelial / LPF 6-30 (*)    Non Squamous Epithelial 0-5 (*)    All other components within normal limits  TROPONIN I    EKG  EKG Interpretation  Date/Time:  Sunday October 15 2017 07:12:07 EST Ventricular Rate:  60 PR Interval:    QRS Duration: 120 QT Interval:  471 QTC Calculation: 471 R Axis:   14 Text Interpretation:  Sinus rhythm Nonspecific intraventricular conduction delay Anteroseptal infarct, old Borderline repolarization abnormality Confirmed by Isla Pence [phone]) on 10/15/2017 7:26:08 AM       Radiology Dg Chest 2 View  Result Date: 10/15/2017 CLINICAL DATA:  Chest pain. EXAM: CHEST  2 VIEW COMPARISON:  Two-view chest x-ray 09/20/2017 FINDINGS: The heart size is normal. Bilateral pleural effusions are increasing. Bibasilar airspace disease is associated. Pulmonary vascular congestion is increasing. Scarring is again noted at the lung apices bilaterally. Aortic atherosclerosis is present. IMPRESSION: 1. Cardiomegaly with increasing pulmonary vascular congestion and bilateral effusions compatible with congestive heart failure. 2. Bibasilar airspace disease likely reflects atelectasis. 3. Aortic  atherosclerosis.  Electronically Signed   By: San Morelle M.D.   On: 10/15/2017 08:49   Ct Head Wo Contrast  Result Date: 10/15/2017 CLINICAL DATA:  Posterior headache EXAM: CT HEAD WITHOUT CONTRAST TECHNIQUE: Contiguous axial images were obtained from the base of the skull through the vertex without intravenous contrast. COMPARISON:  05/14/2013 FINDINGS: Brain: Mild age related volume loss. No acute intracranial abnormality. Specifically, no hemorrhage, hydrocephalus, mass lesion, acute infarction, or significant intracranial injury. Vascular: No hyperdense vessel or unexpected calcification. Skull: No acute calvarial abnormality. Sinuses/Orbits: Visualized paranasal sinuses and mastoids clear. Orbital soft tissues unremarkable. Other: None IMPRESSION: No acute intracranial abnormality. Electronically Signed   By: Rolm Baptise M.D.   On: 10/15/2017 08:29    Procedures Procedures (including critical care time)  Medications Ordered in ED Medications  cefTRIAXone (ROCEPHIN) 2 g in dextrose 5 % 50 mL IVPB (2 g Intravenous New Bag/Given 10/15/17 1035)  carvedilol (COREG) tablet 25 mg (not administered)  hydrALAZINE (APRESOLINE) tablet 50 mg (not administered)  isosorbide mononitrate (IMDUR) 24 hr tablet 120 mg (not administered)  levothyroxine (SYNTHROID, LEVOTHROID) tablet 50 mcg (not administered)  pantoprazole (PROTONIX) EC tablet 40 mg (not administered)  insulin aspart (novoLOG) injection 0-9 Units (not administered)  insulin aspart (novoLOG) injection 0-5 Units (not administered)  ondansetron (ZOFRAN) injection 4 mg (4 mg Intravenous Given 10/15/17 0739)  morphine 4 MG/ML injection 4 mg (4 mg Intravenous Given 10/15/17 0738)  HYDROmorphone (DILAUDID) injection 1 mg (1 mg Intravenous Given 10/15/17 0919)  ondansetron (ZOFRAN) injection 4 mg (4 mg Intravenous Given 10/15/17 0919)  promethazine (PHENERGAN) injection 12.5 mg (12.5 mg Intravenous Given 10/15/17 0943)     Initial Impression /  Assessment and Plan / ED Course  I have reviewed the triage vital signs and the nursing notes.  Pertinent labs & imaging results that were available during my care of the patient were reviewed by me and considered in my medical decision making (see chart for details).    Pt's headache finally feeling better.  Nausea is finally better after 8 mg of zofran and 12.5 mg of phenergan.  Pt is not doing well at home, so I discussed her with Dr. Wynelle Cleveland (triad) who will see her for admission.  Final Clinical Impressions(s) / ED Diagnoses   Final diagnoses:  Acute cystitis without hematuria  Acute nonintractable headache, unspecified headache type  Non-intractable vomiting with nausea, unspecified vomiting type  Acute on chronic congestive heart failure, unspecified heart failure type ([place])  Dehydration  CRI (chronic renal insufficiency), stage 5 (HCC)  Anemia associated with stage 5 chronic renal failure [facility])    ED Discharge Orders    None       Isla Pence, MD 10/15/17 1037

## 2017-10-15 NOTE — ED Notes (Signed)
Patient nauseated and "dry heaving" due to pain medication. EDP aware.

## 2017-10-15 NOTE — ED Triage Notes (Signed)
Pt reports pain in back of head since yesterday and states, "my head feels foolish."  Pt unable to elaborate.  Pt and family denies confusion.  Reports pt recently admitted for chf.  Daughter says pt gained 2lb today.

## 2017-10-16 DIAGNOSIS — G9340 Encephalopathy, unspecified: Secondary | ICD-10-CM

## 2017-10-16 DIAGNOSIS — R1310 Dysphagia, unspecified: Secondary | ICD-10-CM

## 2017-10-16 LAB — GLUCOSE, CAPILLARY
GLUCOSE-CAPILLARY: 91 mg/dL (ref 65–99)
Glucose-Capillary: 143 mg/dL — ABNORMAL HIGH (ref 65–99)
Glucose-Capillary: 159 mg/dL — ABNORMAL HIGH (ref 65–99)
Glucose-Capillary: 155 mg/dL — ABNORMAL HIGH (ref 65–99)

## 2017-10-16 LAB — CBC
HEMATOCRIT: 28.2 % — AB (ref 36.0–46.0)
Hemoglobin: 8.4 g/dL — ABNORMAL LOW (ref 12.0–15.0)
MCH: 29 pg (ref 26.0–34.0)
MCHC: 29.8 g/dL — AB (ref 30.0–36.0)
MCV: 97.2 fL (ref 78.0–100.0)
PLATELETS: 155 10*3/uL (ref 150–400)
RBC: 2.9 MIL/uL — ABNORMAL LOW (ref 3.87–5.11)
RDW: 14.4 % (ref 11.5–15.5)
WBC: 3 10*3/uL — AB (ref 4.0–10.5)

## 2017-10-16 LAB — BASIC METABOLIC PANEL
Anion gap: 10 (ref 5–15)
BUN: 62 mg/dL — AB (ref 6–20)
CALCIUM: 9.6 mg/dL (ref 8.9–10.3)
CO2: 25 mmol/L (ref 22–32)
CREATININE: 4.39 mg/dL — AB (ref 0.44–1.00)
Chloride: 102 mmol/L (ref 101–111)
GFR calc Af Amer: 10 mL/min — ABNORMAL LOW (ref >60)
GFR, EST NON AFRICAN AMERICAN: 8 mL/min — AB (ref >60)
GLUCOSE: 98 mg/dL (ref 65–99)
POTASSIUM: 4.7 mmol/L (ref 3.5–5.1)
SODIUM: 137 mmol/L (ref 135–145)

## 2017-10-16 NOTE — Progress Notes (Signed)
Patient states she was having difficulty swallowing her breakfast. Patient states she felt like she was getting choked. MD notified, speech therapy consult put in.

## 2017-10-16 NOTE — Evaluation (Signed)
Clinical/Bedside Swallow Evaluation Patient Details  Name: [name] MRN: [id number] Date of Birth: 16-Jul-1932  Today's Date: 10/16/2017 Time: SLP Start Time (ACUTE ONLY): 1645 SLP Stop Time (ACUTE ONLY): 1710 SLP Time Calculation (min) (ACUTE ONLY): 25 min  Past Medical History:  Past Medical History:  Diagnosis Date  . CAD (coronary artery disease) 2003   Last catheterization 2008. Two-vessel PCI of the first OM branch and mid LAD.  Marland Kitchen CKD (chronic kidney disease)   . Congestive heart failure ([place]) 02/2017   grade 2 diastolic dysfunction  . CVA (cerebral vascular accident) ([place]) [place]   . Dementia   . DJD (degenerative joint disease) of lumbar spine   . Hypercholesteremia   . Hypertension   . IDDM (insulin dependent diabetes mellitus) ([place])    Past Surgical History:  Past Surgical History:  Procedure Laterality Date  . APPENDECTOMY    . KNEE SURGERY     HPI:  Pt presents to the ED today with headache.  Pt said it started yesterday and got worse over night.  She was unable to sleep due to the pain.  Her family said she has not been eating or drinking much.  Yesterday, pt had cp.  No pain today.  Breathing is ok.  The pt has had nausea, but no vomiting.  The pt was admitted from 10/24-28 for CHF exacerbation.  She did f/u with her doctor on 11/12 and was given a rx for hydrocodone for back pain.  No other med changes.  Pt's daughter said she's been confused as well. BSE ordered due to Pt with reported difficulty swallowing solids earlier today (Pt states this has been going on for several months).   Assessment / Plan / Recommendation Clinical Impression  Oropharyngeal swallow appears WNL in setting of edentulous status. Pt shows no overt signs of symptoms aspiration on 3 oz water challenge or over straw sips thin and puree. Pt able to self feed graham crackers slowly and tolerated without incident. Pt's report of difficulty swallowing solids for several months and  reports of white frothy expectorations appear more consistent with esophageal dysphagia. Chart review reveals that Pt had esophageal thickening on CT chest in April and then had BaSw which showed dysmotility and possible spasm. No GI involvement noted in chart. Consider GI consult given ongoing c/o globus sensation and difficulty swallowing solids. Recommend advancing to D2 and thin liquids. SLP will follow up tomorrow for diet tolerance and education as needed. Above to Dr. Dyann Kief.  SLP Visit Diagnosis: Dysphagia, oropharyngeal phase (R13.12)    Aspiration Risk  Mild aspiration risk    Diet Recommendation Dysphagia 2 (Fine chop);Thin liquid   Liquid Administration via: Cup;Straw Medication Administration: Whole meds with liquid Supervision: Patient able to self feed Postural Changes: Seated upright at 90 degrees;Remain upright for at least 30 minutes after po intake    Other  Recommendations Recommended Consults: Consider GI evaluation Oral Care Recommendations: Oral care BID;Staff/trained caregiver to provide oral care Other Recommendations: Clarify dietary restrictions   Follow up Recommendations None      Frequency and Duration min 2x/week  1 week       Prognosis Prognosis for Safe Diet Advancement: Good Barriers to Reach Goals: Cognitive deficits      Swallow Study   General Date of Onset: 10/15/17 HPI: Pt presents to the ED today with headache.  Pt said it started yesterday and got worse over night.  She was unable to sleep due to the  pain.  Her family said she has not been eating or drinking much.  Yesterday, pt had cp.  No pain today.  Breathing is ok.  The pt has had nausea, but no vomiting.  The pt was admitted from 10/24-28 for CHF exacerbation.  She did f/u with her doctor on 11/12 and was given a rx for hydrocodone for back pain.  No other med changes.  Pt's daughter said she's been confused as well. BSE ordered due to Pt with reported difficulty swallowing solids earlier  today (Pt states this has been going on for several months). Type of Study: Bedside Swallow Evaluation Previous Swallow Assessment: BaSw April 2018-dysmotility and ?spasm Diet Prior to this Study: (full liquids) Temperature Spikes Noted: No Respiratory Status: Room air History of Recent Intubation: No Behavior/Cognition: Alert;Cooperative;Pleasant mood Oral Cavity Assessment: Within Functional Limits Oral Care Completed by SLP: No Oral Cavity - Dentition: Edentulous(Pt lost dentures) Vision: Functional for self-feeding Self-Feeding Abilities: Able to feed self Patient Positioning: Upright in bed Baseline Vocal Quality: Normal Volitional Cough: Strong Volitional Swallow: Able to elicit    Oral/Motor/Sensory Function Overall Oral Motor/Sensory Function: Within functional limits   Ice Chips Ice chips: Within functional limits Presentation: Spoon   Thin Liquid Thin Liquid: Within functional limits Presentation: Cup;Self Fed;Straw    Nectar Thick Nectar Thick Liquid: Not tested   Honey Thick Honey Thick Liquid: Not tested   Puree Puree: Within functional limits Presentation: Spoon   Solid   Thank you,  Genene Churn, CCC-SLP [phone]    Solid: Within functional limits Presentation: Self Fed        PORTER,DABNEY 10/16/2017,5:21 PM

## 2017-10-16 NOTE — Progress Notes (Signed)
[facility] PROGRESS NOTE  [name] WCB:[id number] DOB: 03-31-1932 DOA: 10/15/2017 PCP: Timmothy Euler, MD  Interim summary and HPI  83 year old female with medical history of coronary artery disease status post PCI, grade 2 diastolic congestive heart failure, hypertension insulin-dependent diabetes and chronic kidney disease stage IV; admitted secondary to increased confusion, headaches, acute on chronic congestive heart failure and UTI.  Assessment/Plan: 1-acute on chronic diastolic heart failure -Breathing improving and patient no complaining of chest pain. -Will continue IV Lasix -Follow closely basic metabolic panel to assess renal function -No swelling appreciated in her lower extremity her lungs are clear to auscultation -Increase increase urine output appreciated. -Continue daily weights, low-sodium diet, strict intake and output. -Continue carvedilol, hydralazine and imdur.  2-acute renal failure superimposed on stage IV chronic kidney disease -Most likely secondary to decreased perfusion in the presence of acute on chronic diastolic heart failure. -Will continue IV Lasix -Follow closely renal function trend -Creatinine baseline 3.3.  3-UTI -Follow urine culture -Continue Rocephin  4-history of dementia/confusion -CT head unrevealing -UTI most likely playing a role -Continue supportive care, continue antibiotics, follow improvement in mentation.  5-history of coronary artery disease status post PCI in 2003 and 2008 -Continue aspirin, continue Crestor, continue the carvedilol. -Currently no complaining of chest pain or shortness of breath.  6-insulin-dependent diabetes with nephropathy -Will continue sliding scale insulin  7-dysphasia I am complaining symptoms of food being stuck in her chest -According to patient she has been experiencing this problem seems April of this year. -Hb therapy has evaluated the patient and felt that he is not  oropharyngeal in nature most likely esophageal dysmotility as the cause. -Reviewing patient's records CT scan and barium tablet has already been done on previous admission demonstrated thickening of her esophagus wall. -We will add PPI and consult GI for further recommendations (patient might benefit of at least EGD as an outpatient).  Diet has been adjusted to dysphasia 2 -With thin liquids.  Code Status: DNR Family Communication: No family at bedside. Disposition Plan: Home when medically stable.  For now remain inpatient, continue IV antibiotics, continue Lasix.   Consultants:  GI  Speech therapy  Procedures:  See below for x-ray report  Antibiotics:  Rocephin 11/18  HPI/Subjective: Patient is afebrile, denies chest pain, shortness of breath, abdominal pain or dysuria at this moment.  She was oriented x2 (even with an orbital poor insight into her condition).  Complaining of choking sensation while eating and feeling food stuck in her chest.  Objective: Vitals:   10/16/17 0606 10/16/17 1328  BP: (!) 175/53 (!) 133/56  Pulse: 69 68  Resp: 18 18  Temp: 99.2 F (37.3 C) 98 F (36.7 C)  SpO2: 96% 96%    Intake/Output Summary (Last 24 hours) at 10/16/2017 1736 Last data filed at 10/16/2017 1500 Gross per 24 hour  Intake 410 ml  Output 2000 ml  Net -1590 ml   Filed Weights   10/15/17 0706 10/15/17 1200  Weight: 64.7 kg (142 lb 9.6 oz) 65.3 kg (143 lb 14.4 oz)    Exam:   General: Afebrile, patient oriented x2 (even with overall poor insight on her conditions), denying chest pain, shortness of breath and dysuria.  Patient biggest complaints involved nausea and choking sensation.  Cardiovascular: S1 and S2, no rubs, no gallops; positive systolic ejection murmur.  Respiratory: Good air movement bilaterally, no wheezing, no crackles  Abdomen: Soft, nontender, nondistended, positive bowel sounds  Musculoskeletal: No edema, no cyanosis, no clubbing.  Data  Reviewed: Basic Metabolic Panel: Recent Labs  Lab 10/15/17 0714 10/16/17 0539  NA 140 137  K 4.7 4.7  CL 106 102  CO2 23 25  GLUCOSE 143* 98  BUN 64* 62*  CREATININE 4.43* 4.39*  CALCIUM 9.9 9.6   Liver Function Tests: Recent Labs  Lab 10/15/17 0714  AST 9*  ALT 8*  ALKPHOS 41  BILITOT 0.4  PROT 6.7  ALBUMIN 3.9   CBC: Recent Labs  Lab 10/15/17 0714 10/16/17 0539  WBC 3.2* 3.0*  NEUTROABS 1.9  --   HGB 8.3* 8.4*  HCT 27.5* 28.2*  MCV 96.8 97.2  PLT 159 155   Cardiac Enzymes: Recent Labs  Lab 10/15/17 0714  TROPONINI <0.03   BNP (last 3 results) Recent Labs    11/17/16 0900 09/20/17 1940  BNP 551.1* 1,243.0*   CBG: Recent Labs  Lab 10/15/17 1543 10/15/17 2149 10/16/17 0723 10/16/17 1126 10/16/17 1630  GLUCAP 95 120* 91 143* 159*   Studies: Dg Chest 2 View  Result Date: 10/15/2017 CLINICAL DATA:  Chest pain. EXAM: CHEST  2 VIEW COMPARISON:  Two-view chest x-ray 09/20/2017 FINDINGS: The heart size is normal. Bilateral pleural effusions are increasing. Bibasilar airspace disease is associated. Pulmonary vascular congestion is increasing. Scarring is again noted at the lung apices bilaterally. Aortic atherosclerosis is present. IMPRESSION: 1. Cardiomegaly with increasing pulmonary vascular congestion and bilateral effusions compatible with congestive heart failure. 2. Bibasilar airspace disease likely reflects atelectasis. 3. Aortic atherosclerosis. Electronically Signed   By: San Morelle M.D.   On: 10/15/2017 08:49   Ct Head Wo Contrast  Result Date: 10/15/2017 CLINICAL DATA:  Posterior headache EXAM: CT HEAD WITHOUT CONTRAST TECHNIQUE: Contiguous axial images were obtained from the base of the skull through the vertex without intravenous contrast. COMPARISON:  05/14/2013 FINDINGS: Brain: Mild age related volume loss. No acute intracranial abnormality. Specifically, no hemorrhage, hydrocephalus, mass lesion, acute infarction, or significant  intracranial injury. Vascular: No hyperdense vessel or unexpected calcification. Skull: No acute calvarial abnormality. Sinuses/Orbits: Visualized paranasal sinuses and mastoids clear. Orbital soft tissues unremarkable. Other: None IMPRESSION: No acute intracranial abnormality. Electronically Signed   By: Rolm Baptise M.D.   On: 10/15/2017 08:29    Scheduled Meds: . carvedilol  25 mg Oral BID WC  . furosemide  40 mg Intravenous BID  . heparin subcutaneous  5,000 Units Subcutaneous Q8H  . hydrALAZINE  50 mg Oral BID  . insulin aspart  0-5 Units Subcutaneous QHS  . insulin aspart  0-9 Units Subcutaneous TID WC  . isosorbide mononitrate  120 mg Oral Daily  . levothyroxine  50 mcg Oral QAC breakfast  . pantoprazole  40 mg Oral BID   Continuous Infusions: . cefTRIAXone (ROCEPHIN)  IV Stopped (10/16/17 1417)    Time spent: 30 minutes    [place] [facility] Pager [phone]. If 7PM-7AM, please contact night-coverage at [email or web address], password [facility] 10/16/2017, 5:36 PM  LOS: 1 day

## 2017-10-17 ENCOUNTER — Encounter (HOSPITAL_COMMUNITY): Payer: Medicare Other

## 2017-10-17 LAB — GLUCOSE, CAPILLARY
GLUCOSE-CAPILLARY: 145 mg/dL — AB (ref 65–99)
Glucose-Capillary: 144 mg/dL — ABNORMAL HIGH (ref 65–99)
Glucose-Capillary: 153 mg/dL — ABNORMAL HIGH (ref 65–99)
Glucose-Capillary: 196 mg/dL — ABNORMAL HIGH (ref 65–99)

## 2017-10-17 MED ORDER — POLYETHYLENE GLYCOL 3350 17 G PO PACK
17.0000 g | PACK | Freq: Every day | ORAL | Status: DC
  Administered 2017-10-17: 17 g via ORAL
  Filled 2017-10-17 (×2): qty 1

## 2017-10-17 MED ORDER — FENTANYL CITRATE (PF) 100 MCG/2ML IJ SOLN
12.5000 ug | INTRAMUSCULAR | Status: DC | PRN
  Administered 2017-10-17: 12.5 ug via INTRAVENOUS
  Filled 2017-10-17: qty 2

## 2017-10-17 MED ORDER — DOCUSATE SODIUM 100 MG PO CAPS
100.0000 mg | ORAL_CAPSULE | Freq: Two times a day (BID) | ORAL | Status: DC
  Administered 2017-10-17 (×2): 100 mg via ORAL
  Filled 2017-10-17 (×2): qty 1

## 2017-10-17 MED ORDER — TORSEMIDE 20 MG PO TABS
30.0000 mg | ORAL_TABLET | Freq: Two times a day (BID) | ORAL | Status: DC
  Filled 2017-10-17: qty 2

## 2017-10-17 NOTE — Plan of Care (Signed)
Sitting up in chair.  C/o  abd pain

## 2017-10-17 NOTE — Care Management Note (Signed)
Case Management Note  Patient Details  Name: [name] MRN: [id number] Date of Birth: [birth year]  Subjective/Objective:          Admitted with encephelopathy. Pt is from home, lives with her daughter who works during the day. Patient ambulates with RW, has BSC if needed. Her daughter prepares meals and manages medications. dtr transports to appointments. Pt active with [facility] for RN/OT services. Plans to cont services at DC. Patient wants to go home.            Action/Plan: DC home, possibly today with resumption of [place] services. [facility] rep, aware of admission and will pull orders from chart.   Expected Discharge Date:      10/17/2017            Expected Discharge Plan:  [place]  In-House Referral:  NA  Discharge planning Services  CM Consult  Post Acute Care Choice:  Home Health Choice offered to:  Patient  HH Arranged:  RN, OT [facility] Agency:  [place]  Status of Service:  Completed, signed off  Sherald Barge, RN 10/17/2017, 2:25 PM

## 2017-10-17 NOTE — Progress Notes (Signed)
Complaint of abd pain.  Abd soft and active bs and denies nausea.  Stated last bm was Friday.  Epaged Dr. Dyann Kief.

## 2017-10-17 NOTE — Progress Notes (Signed)
[facility]  Patient Status: Active (receiving services up to time of hospitalization)  [facility] is providing the following services: RN and OT  If patient discharges after hours, please call [phone].   [name] 10/17/2017, 11:57 AM

## 2017-10-17 NOTE — Progress Notes (Signed)
Order for colace and miralax given which yielded bm.  Stated abd feeling better.

## 2017-10-17 NOTE — Progress Notes (Signed)
[facility] PROGRESS NOTE  [name] GQQ:[id number] DOB: [birth year] DOA: 10/15/2017 PCP: Timmothy Euler, MD  Interim summary and HPI  83 year old female with medical history of coronary artery disease status post PCI, grade 2 diastolic congestive heart failure, hypertension insulin-dependent diabetes and chronic kidney disease stage IV; admitted secondary to increased confusion, headaches, acute on chronic congestive heart failure and UTI.  Assessment/Plan: 1-acute on chronic diastolic heart failure -Breathing improved and back to baseline -no CP and with good O2 sat on RA -will transition lasix to PO -continue follow closely BMET to assess renal function and electrolytes -continue daily weights and strict intake and output. -continue heart healthy diet -Continue carvedilol, hydralazine and imdur.  2-acute renal failure superimposed on stage IV chronic kidney disease -Most likely secondary to decreased perfusion in the presence of acute on chronic diastolic heart failure. -Follow closely renal function trend -Creatinine baseline 3.3. -patient decline blood work this morning  3-UTI -Continue Rocephin  4-history of dementia/confusion -CT head unrevealing -UTI most likely playing a role -Continue supportive care, continue antibiotics -per daughter at bedside; patient's mentation back to baseline  5-history of coronary artery disease status post PCI in 2003 and 2008 -Continue aspirin, continue Crestor, continue the carvedilol. -Currently no complaining of chest pain or shortness of breath. -will monitor   6-insulin-dependent diabetes with nephropathy -continue SSI  7-dysphasia and complaining symptoms of food being stuck in her chest -According to patient she has been experiencing this problem since April of this year. -speech therapy has evaluated the patient and felt that her problem is not oropharyngeal in nature most likely esophageal dysmotility as the  cause. -Reviewing patient's records CT scan and barium tablet has already been done and demonstrated thickening of her esophagus wall. -continue PPI twice a day -case discussed with GI and plans is for EGD in am. -Diet has been adjusted to dysphasia 2, With thin liquids. -will follow findings and clinical response.  8-hypothyroidism -continue synthroid   Code Status: DNR Family Communication: daughter at bedside. Disposition Plan: Home when medically stable.  For now remain inpatient, continue IV antibiotics, continue diuretics, but transition to PO . EGD in am.   Consultants:  GI  Speech therapy  Procedures:  See below for x-ray report  Antibiotics:  Rocephin 11/18  HPI/Subjective: No fever, no CP, no nausea, no vomiting. Tolerated dysphagia 2 diet and was asking to be discharge. Mentation is back to baseline.  Objective: Vitals:   10/17/17 1300 10/17/17 1652  BP: (!) 119/46 (!) 173/59  Pulse: 72 74  Resp: 16 16  Temp: 98.4 F (36.9 C)   SpO2: 99%     Intake/Output Summary (Last 24 hours) at 10/17/2017 1856 Last data filed at 10/17/2017 1200 Gross per 24 hour  Intake 290 ml  Output 1150 ml  Net -860 ml   Filed Weights   10/15/17 0706 10/15/17 1200  Weight: 64.7 kg (142 lb 9.6 oz) 65.3 kg (143 lb 14.4 oz)    Exam:   General: no fever, no CP and no SOB. Good O2 sat on RA. Patient's mentation back to baseline.  Cardiovascular: S1 and S2, no rubs, no gallops, positive SEM, no JVD on exam.   Respiratory: good air movement, no wheezing, no frank crackles, no using accessory muscles.   Abdomen: soft, NT, ND, positive BS  Musculoskeletal: no LE edema, no cyanosis, no clubbing.  Data Reviewed: Basic Metabolic Panel: Recent Labs  Lab 10/15/17 0714 10/16/17 0539  NA 140 137  K 4.7  4.7  CL 106 102  CO2 23 25  GLUCOSE 143* 98  BUN 64* 62*  CREATININE 4.43* 4.39*  CALCIUM 9.9 9.6   Liver Function Tests: Recent Labs  Lab 10/15/17 0714  AST 9*   ALT 8*  ALKPHOS 41  BILITOT 0.4  PROT 6.7  ALBUMIN 3.9   CBC: Recent Labs  Lab 10/15/17 0714 10/16/17 0539  WBC 3.2* 3.0*  NEUTROABS 1.9  --   HGB 8.3* 8.4*  HCT 27.5* 28.2*  MCV 96.8 97.2  PLT 159 155   Cardiac Enzymes: Recent Labs  Lab 10/15/17 0714  TROPONINI <0.03   BNP (last 3 results) Recent Labs    11/17/16 0900 09/20/17 1940  BNP 551.1* 1,243.0*   CBG: Recent Labs  Lab 10/16/17 1630 10/16/17 2020 10/17/17 0730 10/17/17 1118 10/17/17 1633  GLUCAP 159* 155* 145* 144* 153*   Studies: No results found.  Scheduled Meds: . carvedilol  25 mg Oral BID WC  . docusate sodium  100 mg Oral BID  . furosemide  40 mg Intravenous BID  . heparin subcutaneous  5,000 Units Subcutaneous Q8H  . hydrALAZINE  50 mg Oral BID  . insulin aspart  0-5 Units Subcutaneous QHS  . insulin aspart  0-9 Units Subcutaneous TID WC  . isosorbide mononitrate  120 mg Oral Daily  . levothyroxine  50 mcg Oral QAC breakfast  . pantoprazole  40 mg Oral BID  . polyethylene glycol  17 g Oral Daily   Continuous Infusions: . cefTRIAXone (ROCEPHIN)  IV Stopped (10/17/17 1300)    Time spent: 25 minutes    [place] [facility] Pager [phone]. If 7PM-7AM, please contact night-coverage at [email or web address], password [facility] 10/17/2017, 6:56 PM  LOS: 2 days

## 2017-10-17 NOTE — Care Management Important Message (Signed)
Important Message  Patient Details  Name: [name] MRN: [id number] Date of Birth: 10/15/1932   Medicare Important Message Given:  Yes    Sherald Barge, RN 10/17/2017, 2:31 PM

## 2017-10-18 DIAGNOSIS — Z5320 Procedure and treatment not carried out because of patient's decision for unspecified reasons: Secondary | ICD-10-CM

## 2017-10-18 DIAGNOSIS — R1314 Dysphagia, pharyngoesophageal phase: Secondary | ICD-10-CM

## 2017-10-18 LAB — BASIC METABOLIC PANEL
Anion gap: 10 (ref 5–15)
BUN: 58 mg/dL — AB (ref 6–20)
CHLORIDE: 99 mmol/L — AB (ref 101–111)
CO2: 27 mmol/L (ref 22–32)
CREATININE: 4.23 mg/dL — AB (ref 0.44–1.00)
Calcium: 10 mg/dL (ref 8.9–10.3)
GFR calc Af Amer: 10 mL/min — ABNORMAL LOW (ref >60)
GFR calc non Af Amer: 9 mL/min — ABNORMAL LOW (ref >60)
GLUCOSE: 166 mg/dL — AB (ref 65–99)
Potassium: 4.4 mmol/L (ref 3.5–5.1)
Sodium: 136 mmol/L (ref 135–145)

## 2017-10-18 LAB — URINE CULTURE: SPECIAL REQUESTS: NORMAL

## 2017-10-18 LAB — GLUCOSE, CAPILLARY
GLUCOSE-CAPILLARY: 139 mg/dL — AB (ref 65–99)
Glucose-Capillary: 147 mg/dL — ABNORMAL HIGH (ref 65–99)

## 2017-10-18 MED ORDER — CEFDINIR 125 MG/5ML PO SUSR
300.0000 mg | Freq: Every day | ORAL | Status: DC
  Administered 2017-10-18: 300 mg via ORAL
  Filled 2017-10-18 (×3): qty 15

## 2017-10-18 MED ORDER — CEFDINIR 125 MG/5ML PO SUSR: 300.0000 mg | Freq: Every day | ORAL | 0 refills | Status: DC

## 2017-10-18 MED ORDER — TORSEMIDE 20 MG PO TABS: 30.0000 mg | ORAL_TABLET | Freq: Two times a day (BID) | ORAL | Status: DC

## 2017-10-18 NOTE — Progress Notes (Signed)
Multiple attempts made to give patient her morning medications.   Pt continues to refuse meds.

## 2017-10-18 NOTE — Progress Notes (Signed)
  Speech Language Pathology Treatment: Dysphagia  Patient Details Name: [name] MRN: [id number] DOB: [birth year] Today's Date: 10/18/2017 Time: 2549-8264 SLP Time Calculation (min) (ACUTE ONLY): 45 min  Assessment / Plan / Recommendation Clinical Impression  Upon SLP arrival, Pt was visibly upset yelling that she wanted to go home. Pt eventually calmed with distraction and reassurance (she has a cat named [name] at home) and accepted po medications. Dr. Laural [name] stopped in room and verbally agreed to allowing a snack for Pt due to Pt being upset. Pt consumed regular graham crackers, orange sherbet, water via straw sips, and all medications without overt signs or symptoms of aspiration. Pt verbalizing repeatedly that she wants to go home. GI work up could be completed as outpatient if Pt chooses to pursue. Recommend D2/chopped and thin liquids with standard aspiration and reflux precautions. Pt's symptoms of difficulty swallowing are vague and not replicated during my visit, however she tolerated D2 diet since admission. Above to Dr. Allyson [name]. SLP will sign off. Reconsult if indicated.    HPI HPI: Pt presents to the ED today with headache.  Pt said it started yesterday and got worse over night.  She was unable to sleep due to the pain.  Her family said she has not been eating or drinking much.  Yesterday, pt had cp.  No pain today.  Breathing is ok.  The pt has had nausea, but no vomiting.  The pt was admitted from 10/24-28 for CHF exacerbation.  She did f/u with her doctor on 11/12 and was given a rx for hydrocodone for back pain.  No other med changes.  Pt's daughter said she's been confused as well. BSE ordered due to Pt with reported difficulty swallowing solids earlier today (Pt states this has been going on for several months).      SLP Plan  All goals met;Discharge SLP treatment due to (comment)       Recommendations  Diet recommendations: Dysphagia 2 (fine chop);Thin liquid Liquids  provided via: Cup;Straw Medication Administration: Whole meds with liquid Supervision: Patient able to self feed Postural Changes and/or Swallow Maneuvers: Out of bed for meals;Seated upright 90 degrees;Upright 30-60 min after meal                Oral Care Recommendations: Oral care BID;Staff/trained caregiver to provide oral care Follow up Recommendations: None Plan: All goals met;Discharge SLP treatment due to (comment)       Thank you,  [name], [place]                 [place] 10/18/2017, 12:59 PM

## 2017-10-18 NOTE — Progress Notes (Addendum)
[facility] PROGRESS NOTE  [name] TKZ:[id number] DOB: 1932-04-13 DOA: 10/15/2017 PCP: Timmothy Euler, MD  Interim summary and HPI  83 year old female with medical history of coronary artery disease status post PCI, grade 2 diastolic congestive heart failure, hypertension insulin-dependent diabetes and chronic kidney disease stage IV; admitted secondary to increased confusion, headaches, acute on chronic congestive heart failure and UTI.  Assessment/Plan: 1-acute on chronic diastolic heart failure -Currently not requiring oxygen, denies any chest pain or shortness of breath Continue   torsemide   -continue follow closely BMET to assess renal function and electrolytes, renal function stable since 11/12 -continue daily weights and strict intake and output. -continue heart healthy diet -Continue carvedilol, hydralazine and imdur.  2-acute renal failure superimposed on stage IV chronic kidney disease Baseline creatinine is about 3.3, she presented with a creatinine of 4.39, now 4.23 -Most likely secondary to decreased perfusion in the presence of acute on chronic diastolic heart failure. -Follow closely renal function trend      3-UTI  Urine culture positive for Klebsiella pneumoniae, Escherichia coli Will change to Omnicef and discontinue Rocephin  4-history of dementia/confusion -CT head unrevealing -UTI most likely playing a role -Continue supportive care, continue antibiotics -per daughter at bedside; patient's mentation back to baseline  5-history of coronary artery disease status post PCI in 2003 and 2008 -Continue aspirin, continue Crestor, continue the carvedilol. -Currently no complaining of chest pain or shortness of breath. -will monitor   6-insulin-dependent diabetes with nephropathy -continue SSI  7-dysphagia and complaining symptoms of food being stuck in throat unable to eat eggs -According to patient she has been experiencing this problem since  April of this year. -speech therapy has evaluated the patient and felt that her problem is not oropharyngeal in nature most likely esophageal dysmotility as the cause. -Reviewing patient's records CT scan and barium tablet has already been done and demonstrated thickening of her esophagus wall. -continue PPI twice a day -case discussed with GI and plans is for EGD, Dr Shelva Majestic to evaluate and decide ,will keep NPO pending this decision -Diet has been adjusted to dysphasia 2, With thin liquids. -will follow findings and clinical response.  8-hypothyroidism -continue synthroid   Code Status: DNR Family Communication: daughter at bedside. Disposition Plan:  Anticipate discharge soon.   Consultants:  GI  Speech therapy  Procedures:  See below for x-ray report  Antibiotics:  Rocephin 11/18  HPI/Subjective:  Daughter feel that patient's mental status is at baseline, she is refusing EGD. No chest pain or shortness of breath  Objective: Vitals:   10/17/17 2130 10/18/17 0528  BP:  (!) 169/48  Pulse:  71  Resp:  16  Temp:  98.3 F (36.8 C)  SpO2: 94% 97%    Intake/Output Summary (Last 24 hours) at 10/18/2017 1045 Last data filed at 10/18/2017 0715 Gross per 24 hour  Intake 530 ml  Output 1627 ml  Net -1097 ml   Filed Weights   10/15/17 0706 10/15/17 1200 10/18/17 0528  Weight: 64.7 kg (142 lb 9.6 oz) 65.3 kg (143 lb 14.4 oz) 61.6 kg (135 lb 11.2 oz)    Exam:   General: no fever, no CP and no SOB. Good O2 sat on RA. Patient's mentation back to baseline.  Cardiovascular: S1 and S2, no rubs, no gallops, positive SEM, no JVD on exam.   Respiratory: good air movement, no wheezing, no frank crackles, no using accessory muscles.   Abdomen: soft, NT, ND, positive BS  Musculoskeletal: no LE  edema, no cyanosis, no clubbing.  Data Reviewed: Basic Metabolic Panel: Recent Labs  Lab 10/15/17 0714 10/16/17 0539 10/18/17 0407  NA 140 137 136  K 4.7 4.7 4.4  CL 106  102 99*  CO2 23 25 27   GLUCOSE 143* 98 166*  BUN 64* 62* 58*  CREATININE 4.43* 4.39* 4.23*  CALCIUM 9.9 9.6 10.0   Liver Function Tests: Recent Labs  Lab 10/15/17 0714  AST 9*  ALT 8*  ALKPHOS 41  BILITOT 0.4  PROT 6.7  ALBUMIN 3.9   CBC: Recent Labs  Lab 10/15/17 0714 10/16/17 0539  WBC 3.2* 3.0*  NEUTROABS 1.9  --   HGB 8.3* 8.4*  HCT 27.5* 28.2*  MCV 96.8 97.2  PLT 159 155   Cardiac Enzymes: Recent Labs  Lab 10/15/17 0714  TROPONINI <0.03   BNP (last 3 results) Recent Labs    11/17/16 0900 09/20/17 1940  BNP 551.1* 1,243.0*   CBG: Recent Labs  Lab 10/17/17 0730 10/17/17 1118 10/17/17 1633 10/17/17 2023 10/18/17 0849  GLUCAP 145* 144* 153* 196* 147*   Studies: No results found.  Scheduled Meds: . carvedilol  25 mg Oral BID WC  . docusate sodium  100 mg Oral BID  . heparin subcutaneous  5,000 Units Subcutaneous Q8H  . hydrALAZINE  50 mg Oral BID  . insulin aspart  0-5 Units Subcutaneous QHS  . insulin aspart  0-9 Units Subcutaneous TID WC  . isosorbide mononitrate  120 mg Oral Daily  . levothyroxine  50 mcg Oral QAC breakfast  . pantoprazole  40 mg Oral BID  . polyethylene glycol  17 g Oral Daily  . torsemide  30 mg Oral BID   Continuous Infusions: . cefTRIAXone (ROCEPHIN)  IV Stopped (10/17/17 1300)    Time spent: 25 minutes    Reyne Dumas  [facility] Pager [phone] . If 7PM-7AM, please contact night-coverage at [email or web address], password [facility] 10/18/2017, 10:45 AM  LOS: 3 days

## 2017-10-18 NOTE — Progress Notes (Signed)
IV removed.  Site clean, dry and intact.  Pt taken down by tech.  Pt stable upon discharge.

## 2017-10-18 NOTE — Consult Note (Signed)
Reason for Consult: Referring Physician:   [name] is an 83 y.o. female.  HPI: Admitted thru the ED with headache, confusion per ED record. Patient cannot tell me why she was admitted. States she feels okay. Slept good last night.  Has not lost weight per patient. Her appetite is okay. States no foods in particular bother her. BMs have been normal.  She is edentulous.  She was seen by me in May of this year.  Admitted to [facility] in April with N,V and mid sternal chest pain.  She underwent a CT which revealed walls of the esophagus appear somewhat thickened thru out suggesting a diffuse esophagitis iof infectious or inflammatory nature.     Underwent a Beside Swallow Evaluation on 11/19 which revealed.  Clinical Impression  Oropharyngeal swallow appears WNL in setting of edentulous status. Pt shows no overt signs of symptoms aspiration on 3 oz water challenge or over straw sips thin and puree. Pt able to self feed graham crackers slowly and tolerated without incident. Pt's report of difficulty swallowing solids for several months and reports of white frothy expectorations appear more consistent with esophageal dysphagia. Chart review reveals that Pt had esophageal thickening on CT chest in April and then had BaSw which showed dysmotility and possible spasm. No GI involvement noted in chart. Consider GI consult given ongoing c/o globus sensation and difficulty swallowing solids. Recommend advancing to D2 and thin liquids. SLP will follow up tomorrow for diet tolerance and education as needed. Above to Dr. Dyann Kief.    DGD esophagram 03/06/2017 IMPRESSION: Poor esophageal motility with esophageal spasm. No evidence of stricture or mass. No evidence of mucosal irregularity. Barium tablet progressed to the distal esophagus but not into the stomach, felt to be due to poor peristalsis. No stricture.       Past Medical History:  Diagnosis Date  . CAD (coronary artery disease) 2003   Last  catheterization 2008. Two-vessel PCI of the first OM branch and mid LAD.  Marland Kitchen CKD (chronic kidney disease)   . Congestive heart failure ([place]) 02/2017   grade 2 diastolic dysfunction  . CVA (cerebral vascular accident) ([place]) [place]   . Dementia   . DJD (degenerative joint disease) of lumbar spine   . Hypercholesteremia   . Hypertension   . IDDM (insulin dependent diabetes mellitus) ([place])     Past Surgical History:  Procedure Laterality Date  . APPENDECTOMY    . KNEE SURGERY      Family History  Problem Relation Age of Onset  . Heart attack Mother 70  . Heart attack Father 27  . Diabetes Brother        RETINOPATHY   . Drug abuse Brother   . Liver cancer Brother   . Breast cancer Daughter   . Diabetes Sister     Social History:  reports that  has never smoked. she has never used smokeless tobacco. She reports that she does not drink alcohol or use drugs.  Allergies:  Allergies  Allergen Reactions  . Propoxyphene N-Acetaminophen     Pt does not know reaction  . Simvastatin Other (See Comments)    headache    Medications: I have reviewed the patient's current medications.  Results for orders placed or performed during the hospital encounter of 10/15/17 (from the past 48 hour(s))  Glucose, capillary     Status: Abnormal   Collection Time: 10/16/17 11:26 AM  Result Value Ref Range   Glucose-Capillary 143 (H) 65 -  99 mg/dL   Comment 1 Notify RN    Comment 2 Document in Chart   Glucose, capillary     Status: Abnormal   Collection Time: 10/16/17  4:30 PM  Result Value Ref Range   Glucose-Capillary 159 (H) 65 - 99 mg/dL   Comment 1 Notify RN    Comment 2 Document in Chart   Glucose, capillary     Status: Abnormal   Collection Time: 10/16/17  8:20 PM  Result Value Ref Range   Glucose-Capillary 155 (H) 65 - 99 mg/dL   Comment 1 Notify RN    Comment 2 Document in Chart   Glucose, capillary     Status: Abnormal   Collection Time: 10/17/17  7:30 AM  Result  Value Ref Range   Glucose-Capillary 145 (H) 65 - 99 mg/dL   Comment 1 Notify RN    Comment 2 Document in Chart   Glucose, capillary     Status: Abnormal   Collection Time: 10/17/17 11:18 AM  Result Value Ref Range   Glucose-Capillary 144 (H) 65 - 99 mg/dL   Comment 1 Notify RN    Comment 2 Document in Chart   Glucose, capillary     Status: Abnormal   Collection Time: 10/17/17  4:33 PM  Result Value Ref Range   Glucose-Capillary 153 (H) 65 - 99 mg/dL   Comment 1 Notify RN    Comment 2 Document in Chart   Glucose, capillary     Status: Abnormal   Collection Time: 10/17/17  8:23 PM  Result Value Ref Range   Glucose-Capillary 196 (H) 65 - 99 mg/dL  Basic metabolic panel     Status: Abnormal   Collection Time: 10/18/17  4:07 AM  Result Value Ref Range   Sodium 136 135 - 145 mmol/L   Potassium 4.4 3.5 - 5.1 mmol/L   Chloride 99 (L) 101 - 111 mmol/L   CO2 27 22 - 32 mmol/L   Glucose, Bld 166 (H) 65 - 99 mg/dL   BUN 58 (H) 6 - 20 mg/dL   Creatinine, Ser 4.23 (H) 0.44 - 1.00 mg/dL   Calcium 10.0 8.9 - 10.3 mg/dL   GFR calc non Af Amer 9 (L) >60 mL/min   GFR calc Af Amer 10 (L) >60 mL/min    Comment: (NOTE) The eGFR has been calculated using the CKD EPI equation. This calculation has not been validated in all clinical situations. eGFR's persistently <60 mL/min signify possible Chronic Kidney Disease.    Anion gap 10 5 - 15    No results found.  ROS Blood pressure (!) 169/48, pulse 71, temperature 98.3 F (36.8 C), temperature source Oral, resp. rate 16, height _0  (1.6 m), weight 135 lb 11.2 oz (61.6 kg), SpO2 97 %. Physical Exam Alert and oriented. Skin warm and dry. Edentulous  Oral mucosa is moist.   . Sclera anicteric, conjunctivae is pink. Thyroid not enlarged. No cervical lymphadenopathy. Lungs clear. Heart regular rate and rhythm.loud murmur heard  Abdomen is soft. Bowel sounds are positive. No hepatomegaly. No abdominal masses felt. No tenderness.  No edema to lower  extremities.    Assessment/Plan: ? Dysphagia. Will discuss with Dr. Rosalin Hawking 10/18/2017, 8:25 AM   GI attending note. Patient's records reviewed including barium study from March 06, 2017. I went to see patient but she was not interested in any evaluation and wanted to go home. I talked with her daughter who was at bedside. Suspect patient has esophageal  motility disorder but she could also have low-grade esophageal stricture. We will be glad to evaluate the patient on an outpatient basis if she changes her mind.

## 2017-10-18 NOTE — Discharge Summary (Addendum)
Physician Discharge Summary  MACI EICKHOLT MRN: [id number] DOB/AGE: May 13, 1932 83 y.o.  PCP: Timmothy Euler, MD   Admit date: 10/15/2017 Discharge date: 10/18/2017  Discharge Diagnoses:    Principal Problem:   Acute encephalopathy Active Problems:   OA (osteoarthritis)   NSVT (nonsustained ventricular tachycardia) (HCC)   CAD S/P PCI '03 and '08   Insulin dependent diabetes mellitus with complications (HCC)   Chronic kidney disease, stage IV (severe) (HCC)   PAF (paroxysmal atrial fibrillation) (HCC)   UTI (urinary tract infection)   Acute on chronic diastolic CHF (congestive heart failure) (HCC)   Acute renal failure superimposed on stage 4 chronic kidney disease ([place])    Follow-up recommendations Follow-up with PCP in 3-5 days , including all  additional recommended appointments as below Follow-up CBC, CMP in 3-5 days Follow up with Dr Lucianne Muss for outpatient EGD  Diet has been adjusted to Dysphagia 2 (fine chop);Thin liquid     Current Discharge Medication List    START taking these medications   Details  cefdinir (OMNICEF) 125 MG/5ML suspension Take 12 mLs (300 mg total) by mouth daily for 5 days. Qty: 60 mL, Refills: 0      CONTINUE these medications which have NOT CHANGED   Details  aspirin EC 81 MG EC tablet Take 1 tablet (81 mg total) by mouth daily. Qty: 30 tablet, Refills: 11    carvedilol (COREG) 25 MG tablet Take 1 tablet (25 mg total) by mouth 2 (two) times daily with a meal. Qty: 60 tablet, Refills: 1    cholecalciferol (VITAMIN D) 1000 units tablet Take 1,000 Units by mouth daily.    diclofenac sodium (VOLTAREN) 1 % GEL Apply 4 g topically 4 (four) times daily. Qty: 100 g, Refills: 2    hydrALAZINE (APRESOLINE) 50 MG tablet Take 1 tablet (50 mg total) by mouth 2 (two) times daily. Qty: 60 tablet, Refills: 1    HYDROcodone-acetaminophen (NORCO) 5-325 MG tablet Take 0.5-1 tablets every 6 (six) hours as needed by mouth for moderate pain. Qty:  30 tablet, Refills: 0    insulin NPH Human (HUMULIN N) 100 UNIT/ML injection Inject 0.1 mLs (10 Units total) into the skin 2 (two) times daily before a meal. Qty: 10 mL, Refills: 1    isosorbide mononitrate (IMDUR) 120 MG 24 hr tablet TAKE 1 TABLET DAILY Qty: 30 tablet, Refills: 4    levothyroxine (SYNTHROID, LEVOTHROID) 50 MCG tablet TAKE 1 TABLET DAILY Qty: 90 tablet, Refills: 3    nitroGLYCERIN (NITROSTAT) 0.4 MG SL tablet PLACE 1 TABLET UNDER THE TONGUE AT ONSET OF CHEST PAIN EVERY 5 MINTUES UP TO 3 TIMES AS NEEDED Qty: 25 tablet, Refills: 1    pantoprazole (PROTONIX) 40 MG tablet Take 1 tablet (40 mg total) by mouth 2 (two) times daily. X one month, then once daily Qty: 60 tablet, Refills: 0    polyethylene glycol powder (GLYCOLAX/MIRALAX) powder MIX 1 CAPFUL (17 GRAMS) WITH 8OZ OF WATER OR JUICE DAILY. Qty: 255 g, Refills: 5    rosuvastatin (CRESTOR) 10 MG tablet TAKE 1 TABLET DAILY Qty: 30 tablet, Refills: 0    sodium polystyrene (KAYEXALATE) 15 GM/60ML suspension Take 60 mLs (15 g total) by mouth daily. Qty: 500 mL, Refills: 2    sucralfate (CARAFATE) 1 GM/10ML suspension Take 10 mLs (1 g total) by mouth every 6 (six) hours. Qty: 420 mL, Refills: 0    torsemide (DEMADEX) 20 MG tablet Take 1 tablet (20 mg total) by mouth 2 (two) times daily.  Qty: 60 tablet, Refills: 1      STOP taking these medications     amLODipine (NORVASC) 5 MG tablet          Discharge Condition: *stable  Discharge Instructions Get Medicines reviewed and adjusted: Please take all your medications with you for your next visit with your Primary MD  Please request your Primary MD to go over all hospital tests and procedure/radiological results at the follow up, please ask your Primary MD to get all Hospital records sent to his/her office.  If you experience worsening of your admission symptoms, develop shortness of breath, life threatening emergency, suicidal or homicidal thoughts you must  seek medical attention immediately by calling 911 or calling your MD immediately if symptoms less severe.  You must read complete instructions/literature along with all the possible adverse reactions/side effects for all the Medicines you take and that have been prescribed to you. Take any new Medicines after you have completely understood and accpet all the possible adverse reactions/side effects.   Do not drive when taking Pain medications.   Do not take more than prescribed Pain, Sleep and Anxiety Medications  Special Instructions: If you have smoked or chewed Tobacco in the last 2 yrs please stop smoking, stop any regular Alcohol and or any Recreational drug use.  Wear Seat belts while driving.  Please note  You were cared for by a hospitalist during your hospital stay. Once you are discharged, your primary care physician will handle any further medical issues. Please note that NO REFILLS for any discharge medications will be authorized once you are discharged, as it is imperative that you return to your primary care physician (or establish a relationship with a primary care physician if you do not have one) for your aftercare needs so that they can reassess your need for medications and monitor your lab values.  Discharge Instructions    Diet - low sodium heart healthy   Complete by:  As directed    Increase activity slowly   Complete by:  As directed        Allergies  Allergen Reactions  . Propoxyphene N-Acetaminophen     Pt does not know reaction  . Simvastatin Other (See Comments)    headache      Disposition: 06-Home-Health Care Svc   Consults: * GI    Significant Diagnostic Studies:  Dg Chest 2 View  Result Date: 10/15/2017 CLINICAL DATA:  Chest pain. EXAM: CHEST  2 VIEW COMPARISON:  Two-view chest x-ray 09/20/2017 FINDINGS: The heart size is normal. Bilateral pleural effusions are increasing. Bibasilar airspace disease is associated. Pulmonary vascular  congestion is increasing. Scarring is again noted at the lung apices bilaterally. Aortic atherosclerosis is present. IMPRESSION: 1. Cardiomegaly with increasing pulmonary vascular congestion and bilateral effusions compatible with congestive heart failure. 2. Bibasilar airspace disease likely reflects atelectasis. 3. Aortic atherosclerosis. Electronically Signed   By: San Morelle M.D.   On: 10/15/2017 08:49   Dg Chest 2 View  Result Date: 09/20/2017 CLINICAL DATA:  Acute chest pain for 2 days and cough for 3 weeks. EXAM: CHEST  2 VIEW COMPARISON:  09/03/2017 and prior radiograph FINDINGS: The cardiomediastinal silhouette is unchanged. Slightly increasing small bilateral pleural effusions and bibasilar atelectasis noted. No pneumothorax.  No interval change identified. No acute bony abnormalities noted. IMPRESSION: Small bilateral pleural effusions and bibasilar atelectasis, slightly increased from prior study. Electronically Signed   By: Margarette Canada M.D.   On: 09/20/2017 19:03   Ct Head  Wo Contrast  Result Date: 10/15/2017 CLINICAL DATA:  Posterior headache EXAM: CT HEAD WITHOUT CONTRAST TECHNIQUE: Contiguous axial images were obtained from the base of the skull through the vertex without intravenous contrast. COMPARISON:  05/14/2013 FINDINGS: Brain: Mild age related volume loss. No acute intracranial abnormality. Specifically, no hemorrhage, hydrocephalus, mass lesion, acute infarction, or significant intracranial injury. Vascular: No hyperdense vessel or unexpected calcification. Skull: No acute calvarial abnormality. Sinuses/Orbits: Visualized paranasal sinuses and mastoids clear. Orbital soft tissues unremarkable. Other: None IMPRESSION: No acute intracranial abnormality. Electronically Signed   By: Rolm Baptise M.D.   On: 10/15/2017 08:29   US Venous Img Lower Bilateral  Result Date: 09/21/2017 CLINICAL DATA:  Bilateral lower extremity edema. EXAM: BILATERAL LOWER EXTREMITY VENOUS  DOPPLER ULTRASOUND TECHNIQUE: Gray-scale sonography with graded compression, as well as color Doppler and duplex ultrasound were performed to evaluate the lower extremity deep venous systems from the level of the common femoral vein and including the common femoral, femoral, profunda femoral, popliteal and calf veins including the posterior tibial, peroneal and gastrocnemius veins when visible. The superficial great saphenous vein was also interrogated. Spectral Doppler was utilized to evaluate flow at rest and with distal augmentation maneuvers in the common femoral, femoral and popliteal veins. COMPARISON:  CT 03/04/2017. FINDINGS: RIGHT LOWER EXTREMITY Common Femoral Vein: No evidence of thrombus. Normal compressibility, respiratory phasicity and response to augmentation. Saphenofemoral Junction: No evidence of thrombus. Normal compressibility and flow on color Doppler imaging. Profunda Femoral Vein: No evidence of thrombus. Normal compressibility and flow on color Doppler imaging. Femoral Vein: No evidence of thrombus. Normal compressibility, respiratory phasicity and response to augmentation. Popliteal Vein: No evidence of thrombus. Normal compressibility, respiratory phasicity and response to augmentation. Calf Veins: No evidence of thrombus. Normal compressibility and flow on color Doppler imaging. Limited visualization of the peroneal veins. Superficial Great Saphenous Vein: No evidence of thrombus. Normal compressibility. Other Findings:  Soft tissue edema. LEFT LOWER EXTREMITY Common Femoral Vein: No evidence of thrombus. Normal compressibility, respiratory phasicity and response to augmentation. Saphenofemoral Junction: No evidence of thrombus. Normal compressibility and flow on color Doppler imaging. Profunda Femoral Vein: No evidence of thrombus. Normal compressibility and flow on color Doppler imaging. Femoral Vein: No evidence of thrombus. Normal compressibility, respiratory phasicity and response to  augmentation. Popliteal Vein: No evidence of thrombus. Normal compressibility, respiratory phasicity and response to augmentation. Calf Veins: No evidence of thrombus. Normal compressibility and flow on color Doppler imaging. Superficial Great Saphenous Vein: No evidence of thrombus. Normal compressibility. Other Findings:  Soft tissue edema. IMPRESSION: 1. No evidence of deep venous thrombosis. 2. Lower extremity soft tissue edema. Electronically Signed   By: Marcello Moores  Register   On: 09/21/2017 14:52        Filed Weights   10/15/17 0706 10/15/17 1200 10/18/17 0528  Weight: 64.7 kg (142 lb 9.6 oz) 65.3 kg (143 lb 14.4 oz) 61.6 kg (135 lb 11.2 oz)     Microbiology: Recent Results (from the past 240 hour(s))  Culture, Urine     Status: Abnormal   Collection Time: 10/15/17  9:10 AM  Result Value Ref Range Status   Specimen Description URINE, CATHETERIZED  Final   Special Requests Normal  Final   Culture (A)  Final    >=100,000 COLONIES/mL KLEBSIELLA PNEUMONIAE >=100,000 COLONIES/mL ESCHERICHIA COLI    Report Status 10/18/2017 FINAL  Final   Organism ID, Bacteria KLEBSIELLA PNEUMONIAE (A)  Final   Organism ID, Bacteria ESCHERICHIA COLI (A)  Final  Susceptibility   Escherichia coli - MIC*    AMPICILLIN <=2 SENSITIVE Sensitive     CEFAZOLIN <=4 SENSITIVE Sensitive     CEFTRIAXONE <=1 SENSITIVE Sensitive     CIPROFLOXACIN >=4 RESISTANT Resistant     GENTAMICIN <=1 SENSITIVE Sensitive     IMIPENEM <=0.25 SENSITIVE Sensitive     NITROFURANTOIN <=16 SENSITIVE Sensitive     TRIMETH/SULFA >=320 RESISTANT Resistant     AMPICILLIN/SULBACTAM <=2 SENSITIVE Sensitive     PIP/TAZO <=4 SENSITIVE Sensitive     Extended ESBL NEGATIVE Sensitive     * >=100,000 COLONIES/mL ESCHERICHIA COLI   Klebsiella pneumoniae - MIC*    AMPICILLIN RESISTANT Resistant     CEFAZOLIN <=4 SENSITIVE Sensitive     CEFTRIAXONE <=1 SENSITIVE Sensitive     CIPROFLOXACIN <=0.25 SENSITIVE Sensitive     GENTAMICIN <=1  SENSITIVE Sensitive     IMIPENEM <=0.25 SENSITIVE Sensitive     NITROFURANTOIN 64 INTERMEDIATE Intermediate     TRIMETH/SULFA <=20 SENSITIVE Sensitive     AMPICILLIN/SULBACTAM <=2 SENSITIVE Sensitive     PIP/TAZO <=4 SENSITIVE Sensitive     Extended ESBL NEGATIVE Sensitive     * >=100,000 COLONIES/mL KLEBSIELLA PNEUMONIAE       Blood Culture    Component Value Date/Time   SDES URINE, CATHETERIZED 10/15/2017 0910   SPECREQUEST Normal 10/15/2017 0910   CULT (A) 10/15/2017 0910    >=100,000 COLONIES/mL KLEBSIELLA PNEUMONIAE >=100,000 COLONIES/mL ESCHERICHIA COLI    REPTSTATUS 10/18/2017 FINAL 10/15/2017 0910      Labs: Results for orders placed or performed during the hospital encounter of 10/15/17 (from the past 48 hour(s))  Glucose, capillary     Status: Abnormal   Collection Time: 10/16/17  4:30 PM  Result Value Ref Range   Glucose-Capillary 159 (H) 65 - 99 mg/dL   Comment 1 Notify RN    Comment 2 Document in Chart   Glucose, capillary     Status: Abnormal   Collection Time: 10/16/17  8:20 PM  Result Value Ref Range   Glucose-Capillary 155 (H) 65 - 99 mg/dL   Comment 1 Notify RN    Comment 2 Document in Chart   Glucose, capillary     Status: Abnormal   Collection Time: 10/17/17  7:30 AM  Result Value Ref Range   Glucose-Capillary 145 (H) 65 - 99 mg/dL   Comment 1 Notify RN    Comment 2 Document in Chart   Glucose, capillary     Status: Abnormal   Collection Time: 10/17/17 11:18 AM  Result Value Ref Range   Glucose-Capillary 144 (H) 65 - 99 mg/dL   Comment 1 Notify RN    Comment 2 Document in Chart   Glucose, capillary     Status: Abnormal   Collection Time: 10/17/17  4:33 PM  Result Value Ref Range   Glucose-Capillary 153 (H) 65 - 99 mg/dL   Comment 1 Notify RN    Comment 2 Document in Chart   Glucose, capillary     Status: Abnormal   Collection Time: 10/17/17  8:23 PM  Result Value Ref Range   Glucose-Capillary 196 (H) 65 - 99 mg/dL  Basic metabolic  panel     Status: Abnormal   Collection Time: 10/18/17  4:07 AM  Result Value Ref Range   Sodium 136 135 - 145 mmol/L   Potassium 4.4 3.5 - 5.1 mmol/L   Chloride 99 (L) 101 - 111 mmol/L   CO2 27 22 - 32 mmol/L   Glucose,  Bld 166 (H) 65 - 99 mg/dL   BUN 58 (H) 6 - 20 mg/dL   Creatinine, Ser 4.23 (H) 0.44 - 1.00 mg/dL   Calcium 10.0 8.9 - 10.3 mg/dL   GFR calc non Af Amer 9 (L) >60 mL/min   GFR calc Af Amer 10 (L) >60 mL/min    Comment: (NOTE) The eGFR has been calculated using the CKD EPI equation. This calculation has not been validated in all clinical situations. eGFR's persistently <60 mL/min signify possible Chronic Kidney Disease.    Anion gap 10 5 - 15  Glucose, capillary     Status: Abnormal   Collection Time: 10/18/17  8:49 AM  Result Value Ref Range   Glucose-Capillary 147 (H) 65 - 99 mg/dL  Glucose, capillary     Status: Abnormal   Collection Time: 10/18/17 11:12 AM  Result Value Ref Range   Glucose-Capillary 139 (H) 65 - 99 mg/dL   Comment 1 Notify RN    Comment 2 Document in Chart      Lipid Panel     Component Value Date/Time   CHOL 106 03/05/2017 0406   CHOL 174 09/30/2014 0946   CHOL 177 06/21/2013 1016   TRIG 150 (H) 03/05/2017 0406   TRIG 175 (H) 03/21/2014 1559   TRIG 240 (H) 06/21/2013 1016   HDL 36 (L) 03/05/2017 0406   HDL 66 09/30/2014 0946   HDL 50 03/21/2014 1559   HDL 42 06/21/2013 1016   CHOLHDL 2.9 03/05/2017 0406   VLDL 30 03/05/2017 0406   LDLCALC 40 03/05/2017 0406   LDLCALC 82 09/30/2014 0946   LDLCALC 73 03/21/2014 1559   LDLCALC 87 06/21/2013 1016     Lab Results  Component Value Date   HGBA1C 5.7 (H) 09/21/2017   HGBA1C 6.6 (H) 03/06/2017   HGBA1C 6.1 (H) 11/17/2016     Lab Results  Component Value Date   MICROALBUR 100 12/26/2014   LDLCALC 40 03/05/2017   CREATININE 4.23 (H) 10/18/2017     HPI : 83 year old female with medical history of coronary artery disease status post PCI, grade 2 diastolic congestive heart  failure, hypertension insulin-dependent diabetes and chronic kidney disease stage IV; admitted secondary to increased confusion, headaches, acute on chronic congestive heart failure and UTI.    HOSPITAL COURSE:  1-acute on chronic diastolic heart failure Currently not requiring oxygen, denies any chest pain or shortness of breath Continue   torsemide   continue follow closely BMET to assess renal function and electrolytes, renal function stable since 11/12 -continue daily weights and strict intake and output. -continue heart healthy diet -Continue carvedilol, hydralazine and imdur. Initial sx seem to have resolved    2-acute renal failure superimposed on stage IV chronic kidney disease Baseline creatinine is about 3.3, she presented with a creatinine of 4.39, now 4.23 Most likely secondary to decreased perfusion in the presence of acute on chronic diastolic heart failure. Follow closely renal function trend      3-UTI Urine culture positive for Klebsiella pneumoniae, Escherichia coli Will change to Omnicef and discontinued Rocephin, continue omnicef for another 5 days    4-history of dementia/confusion -CT head unrevealing -UTI most likely playing a role -Continue supportive care, continue antibiotics -per daughter at bedside; patient's mentation back to baseline  5-history of coronary artery disease status post PCI in 2003 and 2008 -Continue aspirin, continue Crestor, continue the carvedilol. -Currently no complaining of chest pain or shortness of breath. -will monitor   6-insulin-dependent diabetes with nephropathy -continue  SSI  7-dysphagia and complaining symptoms of food being stuck in throat unable to eat eggs -According to patient she has been experiencing this problem since April of this year. -speech therapy has evaluated the patient and felt that her problem is not oropharyngeal in nature most likely esophageal dysmotility as the cause. -Reviewing patient's  records CT scan and barium tablet has already been done and demonstrated thickening of her esophagus wall. -continue PPI twice a day -case discussed with GI and  EGD was considered , Dr Shelva Majestic offered EGD and patient declined  , did well with SLP on 11/21 Diet has been adjusted to Dysphagia 2 (fine chop);Thin liquid    8-hypothyroidism -continue synthroid    Discharge Exam:  Blood pressure (!) 169/48, pulse 71, temperature 98.3 F (36.8 C), temperature source Oral, resp. rate 16, height 5' 3" (1.6 m), weight 61.6 kg (135 lb 11.2 oz), SpO2 97 %.   General: no fever, no CP and no SOB. Good O2 sat on RA. Patient's mentation back to baseline.  Cardiovascular: S1 and S2, no rubs, no gallops, positive SEM, no JVD on exam.   Respiratory: good air movement, no wheezing, no frank crackles, no using accessory muscles.   Abdomen: soft, NT, ND, positive BS  Musculoskeletal: no LE edema, no cyanosis, no clubbing.         SignedReyne Dumas 10/18/2017, 2:02 PM        Time spent >1 hour

## 2017-10-18 NOTE — Evaluation (Signed)
Physical Therapy Evaluation Patient Details Name: AME HEAGLE MRN: [id number] DOB: 23-Mar-1932 Today's Date: 10/18/2017   History of Present Illness  Petronella Shuford Curro is a 83 y.o. female with medical history of CAD s/p PCI, grade 2 dCHF, HTN, IDDM 2,CKD 4  Clinical Impression  Patient functioning at baseline for functional mobility and gait and in the process of discharging to home.  Patient discharged from physical therapy to care of nursing for ambulation as tolerated for length of stay.    Follow Up Recommendations No PT follow up;Supervision/Assistance - 24 hour    Equipment Recommendations  None recommended by PT    Recommendations for Other Services       Precautions / Restrictions Precautions Precautions: Fall Restrictions Weight Bearing Restrictions: No      Mobility  Bed Mobility               General bed mobility comments: Patient presents up in chair   Transfers Overall transfer level: Modified independent Equipment used: Rolling walker (2 wheeled) Transfers: Sit to/from [facility] Sit to Stand: Modified independent (Device/Increase time) Stand pivot transfers: Modified independent (Device/Increase time)          Ambulation/Gait Ambulation/Gait assistance: Modified independent (Device/Increase time) Ambulation Distance (Feet): 50 Feet Assistive device: Rolling walker (2 wheeled) Gait Pattern/deviations: WFL(Within Functional Limits)   Gait velocity interpretation: Below normal speed for age/gender General Gait Details: grossly WFLs except slightly labored slower than normal cadence  Stairs            Wheelchair Mobility    Modified Rankin (Stroke Patients Only)       Balance Overall balance assessment: Needs assistance Sitting-balance support: No upper extremity supported;Feet supported Sitting balance-Leahy Scale: Good     Standing balance support: Bilateral upper extremity supported;During functional  activity Standing balance-Leahy Scale: Good                               Pertinent Vitals/Pain Pain Assessment: No/denies pain    Home Living Family/patient expects to be discharged to:: Private residence Living Arrangements: Children Available Help at Discharge: Family;Available 24 hours/day(her daughter) Type of Home: House       Home Layout: One level Home Equipment: Walker - 2 wheels;Bedside commode;Shower seat      Prior Function Level of Independence: Needs assistance   Gait / Transfers Assistance Needed: uses front wheeled walker for short distances in home and stand pivot transfer  ADL's / Homemaking Assistance Needed: patients daughter performs         Hand Dominance   Dominant Hand: Right    Extremity/Trunk Assessment   Upper Extremity Assessment Upper Extremity Assessment: Overall WFL for tasks assessed    Lower Extremity Assessment Lower Extremity Assessment: Overall WFL for tasks assessed       Communication   Communication: No difficulties  Cognition Arousal/Alertness: Awake/alert Behavior During Therapy: WFL for tasks assessed/performed Overall Cognitive Status: Within Functional Limits for tasks assessed                                        General Comments      Exercises     Assessment/Plan    PT Assessment Patent does not need any further PT services  PT Problem List         PT Treatment Interventions  PT Goals (Current goals can be found in the Care Plan section)  Acute Rehab PT Goals Patient Stated Goal: return home with daughter to assist PT Goal Formulation: With patient/family Time For Goal Achievement: 10/18/17 Potential to Achieve Goals: Good    Frequency     Barriers to discharge        Co-evaluation               AM-PAC PT "6 Clicks" Daily Activity  Outcome Measure Difficulty turning over in bed (including adjusting bedclothes, sheets and blankets)?: None Difficulty  moving from lying on back to sitting on the side of the bed? : A Little Difficulty sitting down on and standing up from a chair with arms (e.g., wheelchair, bedside commode, etc,.)?: A Little Help needed moving to and from a bed to chair (including a wheelchair)?: None Help needed walking in hospital room?: None Help needed climbing 3-5 steps with a railing? : A Little 6 Click Score: 21    End of Session   Activity Tolerance: Patient tolerated treatment well Patient left: in chair;with call bell/phone within reach;with family/visitor present;with nursing/sitter in room Nurse Communication: Mobility status;Other (comment) PT Visit Diagnosis: Unsteadiness on feet (R26.81);Other abnormalities of gait and mobility (R26.89);Muscle weakness (generalized) (M62.81)    Time: 4627-0350 PT Time Calculation (min) (ACUTE ONLY): 18 min   Charges:   PT Evaluation $PT Eval Low Complexity: 1 Low PT Treatments $Therapeutic Activity: 8-22 mins   PT G Codes:   PT G-Codes **NOT FOR INPATIENT CLASS** Functional Assessment Tool Used: AM-PAC 6 Clicks Basic Mobility Functional Limitation: Mobility: Walking and moving around Mobility: Walking and Moving Around Current Status (K9381): At least 20 percent but less than 40 percent impaired, limited or restricted Mobility: Walking and Moving Around Goal Status [phone]): At least 20 percent but less than 40 percent impaired, limited or restricted Mobility: Walking and Moving Around Discharge Status [phone]): At least 20 percent but less than 40 percent impaired, limited or restricted    2:48 PM, 10/18/17 Lonell Grandchild, MPT Physical Therapist with [facility] [phone] office [phone] mobile phone

## 2017-10-18 NOTE — Care Management (Signed)
[facility] rep, Romualdo Bolk, aware of DC home today.

## 2017-10-20 ENCOUNTER — Telehealth: Payer: Self-pay | Admitting: Family Medicine

## 2017-10-20 DIAGNOSIS — I2510 Atherosclerotic heart disease of native coronary artery without angina pectoris: Secondary | ICD-10-CM | POA: Diagnosis not present

## 2017-10-20 DIAGNOSIS — Z794 Long term (current) use of insulin: Secondary | ICD-10-CM | POA: Diagnosis not present

## 2017-10-20 DIAGNOSIS — E1122 Type 2 diabetes mellitus with diabetic chronic kidney disease: Secondary | ICD-10-CM | POA: Diagnosis not present

## 2017-10-20 DIAGNOSIS — I130 Hypertensive heart and chronic kidney disease with heart failure and stage 1 through stage 4 chronic kidney disease, or unspecified chronic kidney disease: Secondary | ICD-10-CM | POA: Diagnosis not present

## 2017-10-20 DIAGNOSIS — N184 Chronic kidney disease, stage 4 (severe): Secondary | ICD-10-CM | POA: Diagnosis not present

## 2017-10-20 DIAGNOSIS — E785 Hyperlipidemia, unspecified: Secondary | ICD-10-CM | POA: Diagnosis not present

## 2017-10-20 DIAGNOSIS — Z7982 Long term (current) use of aspirin: Secondary | ICD-10-CM | POA: Diagnosis not present

## 2017-10-20 DIAGNOSIS — I5023 Acute on chronic systolic (congestive) heart failure: Secondary | ICD-10-CM | POA: Diagnosis not present

## 2017-10-20 NOTE — Telephone Encounter (Signed)
Pt given hospital f/u appt with Dr [name] 11/26 at 1:55.

## 2017-10-23 ENCOUNTER — Ambulatory Visit (INDEPENDENT_AMBULATORY_CARE_PROVIDER_SITE_OTHER): Payer: Medicare Other | Admitting: Family Medicine

## 2017-10-23 ENCOUNTER — Encounter: Payer: Self-pay | Admitting: Family Medicine

## 2017-10-23 VITALS — BP 140/57 | HR 53 | Temp 97.2°F | Ht 63.0 in | Wt 139.4 lb

## 2017-10-23 DIAGNOSIS — I5023 Acute on chronic systolic (congestive) heart failure: Secondary | ICD-10-CM | POA: Diagnosis not present

## 2017-10-23 DIAGNOSIS — E1122 Type 2 diabetes mellitus with diabetic chronic kidney disease: Secondary | ICD-10-CM | POA: Diagnosis not present

## 2017-10-23 DIAGNOSIS — Z794 Long term (current) use of insulin: Secondary | ICD-10-CM | POA: Diagnosis not present

## 2017-10-23 DIAGNOSIS — I2510 Atherosclerotic heart disease of native coronary artery without angina pectoris: Secondary | ICD-10-CM | POA: Diagnosis not present

## 2017-10-23 DIAGNOSIS — N3000 Acute cystitis without hematuria: Secondary | ICD-10-CM | POA: Diagnosis not present

## 2017-10-23 DIAGNOSIS — E785 Hyperlipidemia, unspecified: Secondary | ICD-10-CM | POA: Diagnosis not present

## 2017-10-23 DIAGNOSIS — Z7982 Long term (current) use of aspirin: Secondary | ICD-10-CM | POA: Diagnosis not present

## 2017-10-23 DIAGNOSIS — N184 Chronic kidney disease, stage 4 (severe): Secondary | ICD-10-CM | POA: Diagnosis not present

## 2017-10-23 DIAGNOSIS — I130 Hypertensive heart and chronic kidney disease with heart failure and stage 1 through stage 4 chronic kidney disease, or unspecified chronic kidney disease: Secondary | ICD-10-CM | POA: Diagnosis not present

## 2017-10-23 LAB — URINALYSIS, COMPLETE
BILIRUBIN UA: NEGATIVE
GLUCOSE, UA: NEGATIVE
KETONES UA: NEGATIVE
NITRITE UA: NEGATIVE
RBC UA: NEGATIVE
SPEC GRAV UA: 1.025 (ref 1.005–1.030)
Urobilinogen, Ur: 0.2 mg/dL (ref 0.2–1.0)
pH, UA: 6.5 (ref 5.0–7.5)

## 2017-10-23 LAB — MICROSCOPIC EXAMINATION: Epithelial Cells (non renal): >10 /hpf — AB (ref 0–10)

## 2017-10-23 MED ORDER — CEFDINIR 300 MG PO CAPS: 300.0000 mg | ORAL_CAPSULE | Freq: Two times a day (BID) | ORAL | 0 refills | Status: DC

## 2017-10-23 NOTE — Progress Notes (Signed)
   HPI  Patient presents today for hospital follow-up  Patient was hospitalized with headache, confusion, and decreased p.o. intake found to have UTI.  She was treated with IV antibiotics and discharged on liquid Omnicef which she refused to take.  She requests Omnicef pills today.  She states overall she feels better.  She has some paperwork from the [place] to get home care which we filled out today.  She denies any chest pain or shortness of breath.   PMH: Smoking status noted ROS: Per HPI  Objective: BP (!) 140/57   Pulse (!) 53   Temp (!) 97.2 F (36.2 C) (Oral)   Ht _0  (1.6 m)   Wt 139 lb 6.4 oz (63.2 kg)   BMI 24.69 kg/m  Gen: NAD, alert, cooperative with exam HEENT: NCAT CV: Regular rhythm, bradycardia Resp: CTABL, no wheezes, non-labored Ext: No edema, warm Neuro: Alert and oriented, No gross deficits  Assessment and plan:  #UTI Patient treated for UTI and in the hospital, she was to finish 5 additional days of Omnicef which she did not do, this was about 5 days ago. I have given her another course of Omnicef, however explained that she could have inadequate treatment due to the lapse in treatment, repeat urine culture today. Repeat labs today as well.    Multiple diagnoses reviewed for her paper work, lumbar spondylosis, cervical spine DDD, right shoulder arthritis, dementia with MMSE of 23/30, CAD, CHF, CKD stage IV, type 2 diabetes    Orders Placed This Encounter  Procedures  . Urine Culture  . CBC with Differential/Platelet  . CMP14+EGFR  . Urinalysis, Complete    Meds ordered this encounter  Medications  . cefdinir (OMNICEF) 300 MG capsule    Sig: Take 1 capsule (300 mg total) by mouth 2 (two) times daily. 1 po BID    Dispense:  14 capsule    Refill:  0    Laroy Apple, MD [facility] 10/23/2017, 2:57 PM

## 2017-10-24 ENCOUNTER — Encounter (HOSPITAL_COMMUNITY)
Admission: RE | Admit: 2017-10-24 | Discharge: 2017-10-24 | Disposition: A | Discharge status: Home / Self Care | Payer: Medicare Other | Source: Ambulatory Visit | Attending: Nephrology | Admitting: Nephrology

## 2017-10-24 ENCOUNTER — Encounter (HOSPITAL_COMMUNITY): Payer: Self-pay

## 2017-10-24 DIAGNOSIS — R809 Proteinuria, unspecified: Secondary | ICD-10-CM | POA: Diagnosis not present

## 2017-10-24 DIAGNOSIS — I10 Essential (primary) hypertension: Secondary | ICD-10-CM | POA: Diagnosis not present

## 2017-10-24 DIAGNOSIS — N184 Chronic kidney disease, stage 4 (severe): Secondary | ICD-10-CM | POA: Diagnosis not present

## 2017-10-24 DIAGNOSIS — I509 Heart failure, unspecified: Secondary | ICD-10-CM | POA: Diagnosis not present

## 2017-10-24 DIAGNOSIS — D631 Anemia in chronic kidney disease: Secondary | ICD-10-CM | POA: Diagnosis not present

## 2017-10-24 DIAGNOSIS — E1129 Type 2 diabetes mellitus with other diabetic kidney complication: Secondary | ICD-10-CM | POA: Diagnosis not present

## 2017-10-24 LAB — CMP14+EGFR
A/G RATIO: 2.2 (ref 1.2–2.2)
ALBUMIN: 4.1 g/dL (ref 3.5–4.7)
ALT: 8 IU/L (ref 0–32)
AST: 10 IU/L (ref 0–40)
Alkaline Phosphatase: 43 IU/L (ref 39–117)
BUN/Creatinine Ratio: 12 (ref 12–28)
BUN: 52 mg/dL — AB (ref 8–27)
CO2: 24 mmol/L (ref 20–29)
CREATININE: 4.38 mg/dL — AB (ref 0.57–1.00)
Calcium: 9.3 mg/dL (ref 8.7–10.3)
Chloride: 99 mmol/L (ref 96–106)
GFR calc non Af Amer: 9 mL/min/{1.73_m2} — ABNORMAL LOW (ref >59)
GFR, EST AFRICAN AMERICAN: 10 mL/min/{1.73_m2} — AB (ref >59)
GLOBULIN, TOTAL: 1.9 g/dL (ref 1.5–4.5)
GLUCOSE: 139 mg/dL — AB (ref 65–99)
Potassium: 5 mmol/L (ref 3.5–5.2)
SODIUM: 139 mmol/L (ref 134–144)
TOTAL PROTEIN: 6 g/dL (ref 6.0–8.5)

## 2017-10-24 LAB — CBC WITH DIFFERENTIAL/PLATELET
BASOS: 1 %
Basophils Absolute: 0 10*3/uL (ref 0.0–0.2)
EOS (ABSOLUTE): 0.1 10*3/uL (ref 0.0–0.4)
Eos: 4 %
HEMATOCRIT: 25.7 % — AB (ref 34.0–46.6)
HEMOGLOBIN: 8.3 g/dL — AB (ref 11.1–15.9)
Immature Grans (Abs): 0 10*3/uL (ref 0.0–0.1)
Immature Granulocytes: 0 %
LYMPHS: 35 %
Lymphocytes Absolute: 1.2 10*3/uL (ref 0.7–3.1)
MCH: 29 pg (ref 26.6–33.0)
MCHC: 32.3 g/dL (ref 31.5–35.7)
MCV: 90 fL (ref 79–97)
MONOCYTES: 9 %
Monocytes Absolute: 0.3 10*3/uL (ref 0.1–0.9)
NEUTROS ABS: 1.7 10*3/uL (ref 1.4–7.0)
Neutrophils: 51 %
Platelets: 175 10*3/uL (ref 150–379)
RBC: 2.86 x10E6/uL — ABNORMAL LOW (ref 3.77–5.28)
RDW: 15.1 % (ref 12.3–15.4)
WBC: 3.3 10*3/uL — ABNORMAL LOW (ref 3.4–10.8)

## 2017-10-24 LAB — POCT HEMOGLOBIN-HEMACUE: HEMOGLOBIN: 8.9 g/dL — AB (ref 12.0–15.0)

## 2017-10-24 MED ORDER — EPOETIN ALFA 3000 UNIT/ML IJ SOLN
4000.0000 [IU] | Freq: Once | INTRAMUSCULAR | Status: AC
  Administered 2017-10-24: 4000 [IU] via SUBCUTANEOUS

## 2017-10-24 NOTE — Progress Notes (Signed)
Results for [name], [name] (MRN [id number]) as of 10/24/2017 14:30  Ref. Range 10/24/2017 13:48  Hemoglobin Latest Ref Range: 12.0 - 15.0 g/dL 8.9 (L)

## 2017-10-25 ENCOUNTER — Other Ambulatory Visit: Payer: Self-pay | Admitting: Family Medicine

## 2017-10-25 DIAGNOSIS — I5023 Acute on chronic systolic (congestive) heart failure: Secondary | ICD-10-CM | POA: Diagnosis not present

## 2017-10-25 DIAGNOSIS — E785 Hyperlipidemia, unspecified: Secondary | ICD-10-CM | POA: Diagnosis not present

## 2017-10-25 DIAGNOSIS — Z7982 Long term (current) use of aspirin: Secondary | ICD-10-CM | POA: Diagnosis not present

## 2017-10-25 DIAGNOSIS — E1122 Type 2 diabetes mellitus with diabetic chronic kidney disease: Secondary | ICD-10-CM | POA: Diagnosis not present

## 2017-10-25 DIAGNOSIS — N184 Chronic kidney disease, stage 4 (severe): Secondary | ICD-10-CM | POA: Diagnosis not present

## 2017-10-25 DIAGNOSIS — I2510 Atherosclerotic heart disease of native coronary artery without angina pectoris: Secondary | ICD-10-CM | POA: Diagnosis not present

## 2017-10-25 DIAGNOSIS — I130 Hypertensive heart and chronic kidney disease with heart failure and stage 1 through stage 4 chronic kidney disease, or unspecified chronic kidney disease: Secondary | ICD-10-CM | POA: Diagnosis not present

## 2017-10-25 DIAGNOSIS — Z794 Long term (current) use of insulin: Secondary | ICD-10-CM | POA: Diagnosis not present

## 2017-10-25 MED ORDER — CIPROFLOXACIN HCL 250 MG PO TABS: 250.0000 mg | ORAL_TABLET | Freq: Two times a day (BID) | ORAL | 0 refills | Status: DC

## 2017-10-26 DIAGNOSIS — I2510 Atherosclerotic heart disease of native coronary artery without angina pectoris: Secondary | ICD-10-CM | POA: Diagnosis not present

## 2017-10-26 DIAGNOSIS — N183 Chronic kidney disease, stage 3 (moderate): Secondary | ICD-10-CM | POA: Diagnosis not present

## 2017-10-26 DIAGNOSIS — I5023 Acute on chronic systolic (congestive) heart failure: Secondary | ICD-10-CM | POA: Diagnosis not present

## 2017-10-26 DIAGNOSIS — Z7982 Long term (current) use of aspirin: Secondary | ICD-10-CM | POA: Diagnosis not present

## 2017-10-26 DIAGNOSIS — E1122 Type 2 diabetes mellitus with diabetic chronic kidney disease: Secondary | ICD-10-CM | POA: Diagnosis not present

## 2017-10-26 DIAGNOSIS — N184 Chronic kidney disease, stage 4 (severe): Secondary | ICD-10-CM | POA: Diagnosis not present

## 2017-10-26 DIAGNOSIS — I5033 Acute on chronic diastolic (congestive) heart failure: Secondary | ICD-10-CM | POA: Diagnosis not present

## 2017-10-26 DIAGNOSIS — I130 Hypertensive heart and chronic kidney disease with heart failure and stage 1 through stage 4 chronic kidney disease, or unspecified chronic kidney disease: Secondary | ICD-10-CM | POA: Diagnosis not present

## 2017-10-26 DIAGNOSIS — E785 Hyperlipidemia, unspecified: Secondary | ICD-10-CM | POA: Diagnosis not present

## 2017-10-26 DIAGNOSIS — Z794 Long term (current) use of insulin: Secondary | ICD-10-CM | POA: Diagnosis not present

## 2017-10-26 LAB — URINE CULTURE

## 2017-10-27 DIAGNOSIS — E1122 Type 2 diabetes mellitus with diabetic chronic kidney disease: Secondary | ICD-10-CM | POA: Diagnosis not present

## 2017-10-27 DIAGNOSIS — N184 Chronic kidney disease, stage 4 (severe): Secondary | ICD-10-CM | POA: Diagnosis not present

## 2017-10-27 DIAGNOSIS — I5023 Acute on chronic systolic (congestive) heart failure: Secondary | ICD-10-CM | POA: Diagnosis not present

## 2017-10-27 DIAGNOSIS — E785 Hyperlipidemia, unspecified: Secondary | ICD-10-CM | POA: Diagnosis not present

## 2017-10-27 DIAGNOSIS — Z7982 Long term (current) use of aspirin: Secondary | ICD-10-CM | POA: Diagnosis not present

## 2017-10-27 DIAGNOSIS — Z794 Long term (current) use of insulin: Secondary | ICD-10-CM | POA: Diagnosis not present

## 2017-10-27 DIAGNOSIS — I2510 Atherosclerotic heart disease of native coronary artery without angina pectoris: Secondary | ICD-10-CM | POA: Diagnosis not present

## 2017-10-27 DIAGNOSIS — I130 Hypertensive heart and chronic kidney disease with heart failure and stage 1 through stage 4 chronic kidney disease, or unspecified chronic kidney disease: Secondary | ICD-10-CM | POA: Diagnosis not present

## 2017-10-31 DIAGNOSIS — E1122 Type 2 diabetes mellitus with diabetic chronic kidney disease: Secondary | ICD-10-CM | POA: Diagnosis not present

## 2017-10-31 DIAGNOSIS — I130 Hypertensive heart and chronic kidney disease with heart failure and stage 1 through stage 4 chronic kidney disease, or unspecified chronic kidney disease: Secondary | ICD-10-CM | POA: Diagnosis not present

## 2017-10-31 DIAGNOSIS — N184 Chronic kidney disease, stage 4 (severe): Secondary | ICD-10-CM | POA: Diagnosis not present

## 2017-10-31 DIAGNOSIS — Z794 Long term (current) use of insulin: Secondary | ICD-10-CM | POA: Diagnosis not present

## 2017-10-31 DIAGNOSIS — Z7982 Long term (current) use of aspirin: Secondary | ICD-10-CM | POA: Diagnosis not present

## 2017-10-31 DIAGNOSIS — I2510 Atherosclerotic heart disease of native coronary artery without angina pectoris: Secondary | ICD-10-CM | POA: Diagnosis not present

## 2017-10-31 DIAGNOSIS — E785 Hyperlipidemia, unspecified: Secondary | ICD-10-CM | POA: Diagnosis not present

## 2017-10-31 DIAGNOSIS — I5023 Acute on chronic systolic (congestive) heart failure: Secondary | ICD-10-CM | POA: Diagnosis not present

## 2017-11-01 DIAGNOSIS — I5023 Acute on chronic systolic (congestive) heart failure: Secondary | ICD-10-CM | POA: Diagnosis not present

## 2017-11-01 DIAGNOSIS — Z794 Long term (current) use of insulin: Secondary | ICD-10-CM | POA: Diagnosis not present

## 2017-11-01 DIAGNOSIS — E785 Hyperlipidemia, unspecified: Secondary | ICD-10-CM | POA: Diagnosis not present

## 2017-11-01 DIAGNOSIS — N184 Chronic kidney disease, stage 4 (severe): Secondary | ICD-10-CM | POA: Diagnosis not present

## 2017-11-01 DIAGNOSIS — I130 Hypertensive heart and chronic kidney disease with heart failure and stage 1 through stage 4 chronic kidney disease, or unspecified chronic kidney disease: Secondary | ICD-10-CM | POA: Diagnosis not present

## 2017-11-01 DIAGNOSIS — Z7982 Long term (current) use of aspirin: Secondary | ICD-10-CM | POA: Diagnosis not present

## 2017-11-01 DIAGNOSIS — E1122 Type 2 diabetes mellitus with diabetic chronic kidney disease: Secondary | ICD-10-CM | POA: Diagnosis not present

## 2017-11-01 DIAGNOSIS — I2510 Atherosclerotic heart disease of native coronary artery without angina pectoris: Secondary | ICD-10-CM | POA: Diagnosis not present

## 2017-11-03 DIAGNOSIS — Z794 Long term (current) use of insulin: Secondary | ICD-10-CM | POA: Diagnosis not present

## 2017-11-03 DIAGNOSIS — I5023 Acute on chronic systolic (congestive) heart failure: Secondary | ICD-10-CM | POA: Diagnosis not present

## 2017-11-03 DIAGNOSIS — I2510 Atherosclerotic heart disease of native coronary artery without angina pectoris: Secondary | ICD-10-CM | POA: Diagnosis not present

## 2017-11-03 DIAGNOSIS — N184 Chronic kidney disease, stage 4 (severe): Secondary | ICD-10-CM | POA: Diagnosis not present

## 2017-11-03 DIAGNOSIS — E1122 Type 2 diabetes mellitus with diabetic chronic kidney disease: Secondary | ICD-10-CM | POA: Diagnosis not present

## 2017-11-03 DIAGNOSIS — I130 Hypertensive heart and chronic kidney disease with heart failure and stage 1 through stage 4 chronic kidney disease, or unspecified chronic kidney disease: Secondary | ICD-10-CM | POA: Diagnosis not present

## 2017-11-03 DIAGNOSIS — E785 Hyperlipidemia, unspecified: Secondary | ICD-10-CM | POA: Diagnosis not present

## 2017-11-03 DIAGNOSIS — Z7982 Long term (current) use of aspirin: Secondary | ICD-10-CM | POA: Diagnosis not present

## 2017-11-07 ENCOUNTER — Encounter (HOSPITAL_COMMUNITY): Payer: Medicare Other

## 2017-11-07 ENCOUNTER — Encounter (HOSPITAL_COMMUNITY): Admission: RE | Admit: 2017-11-07 | Payer: Medicare Other | Source: Ambulatory Visit

## 2017-11-07 DIAGNOSIS — Z7982 Long term (current) use of aspirin: Secondary | ICD-10-CM | POA: Diagnosis not present

## 2017-11-07 DIAGNOSIS — E785 Hyperlipidemia, unspecified: Secondary | ICD-10-CM | POA: Diagnosis not present

## 2017-11-07 DIAGNOSIS — N184 Chronic kidney disease, stage 4 (severe): Secondary | ICD-10-CM | POA: Diagnosis not present

## 2017-11-07 DIAGNOSIS — I130 Hypertensive heart and chronic kidney disease with heart failure and stage 1 through stage 4 chronic kidney disease, or unspecified chronic kidney disease: Secondary | ICD-10-CM | POA: Diagnosis not present

## 2017-11-07 DIAGNOSIS — Z794 Long term (current) use of insulin: Secondary | ICD-10-CM | POA: Diagnosis not present

## 2017-11-07 DIAGNOSIS — I5023 Acute on chronic systolic (congestive) heart failure: Secondary | ICD-10-CM | POA: Diagnosis not present

## 2017-11-07 DIAGNOSIS — I2510 Atherosclerotic heart disease of native coronary artery without angina pectoris: Secondary | ICD-10-CM | POA: Diagnosis not present

## 2017-11-07 DIAGNOSIS — E1122 Type 2 diabetes mellitus with diabetic chronic kidney disease: Secondary | ICD-10-CM | POA: Diagnosis not present

## 2017-11-09 ENCOUNTER — Telehealth: Payer: Self-pay | Admitting: Family Medicine

## 2017-11-09 NOTE — Telephone Encounter (Signed)
lmtcb

## 2017-11-09 NOTE — Telephone Encounter (Signed)
Daughter states that patient states she "feels foolish". States that she can not get her to eat and she has been in and out of it all day. This has been going on today- states she is not acting herself. Patient was fine yesterday but started acting different today.  Advised that she go to the ER to get evaluated. Daughter verbalizes understanding.

## 2017-11-10 ENCOUNTER — Encounter (HOSPITAL_COMMUNITY)
Admission: RE | Admit: 2017-11-10 | Discharge: 2017-11-10 | Disposition: A | Discharge status: Home / Self Care | Payer: Medicare Other | Source: Ambulatory Visit | Attending: Nephrology | Admitting: Nephrology

## 2017-11-10 ENCOUNTER — Encounter (HOSPITAL_COMMUNITY): Payer: Self-pay

## 2017-11-10 DIAGNOSIS — D631 Anemia in chronic kidney disease: Secondary | ICD-10-CM | POA: Diagnosis not present

## 2017-11-10 DIAGNOSIS — N184 Chronic kidney disease, stage 4 (severe): Secondary | ICD-10-CM | POA: Diagnosis not present

## 2017-11-10 MED ORDER — EPOETIN ALFA 4000 UNIT/ML IJ SOLN
8000.0000 [IU] | INTRAMUSCULAR | Status: DC
  Administered 2017-11-10: 8000 [IU] via SUBCUTANEOUS

## 2017-11-10 MED ORDER — EPOETIN ALFA 4000 UNIT/ML IJ SOLN
INTRAMUSCULAR | Status: AC
  Filled 2017-11-10: qty 2

## 2017-11-13 ENCOUNTER — Telehealth: Payer: Self-pay | Admitting: Family Medicine

## 2017-11-13 DIAGNOSIS — I2510 Atherosclerotic heart disease of native coronary artery without angina pectoris: Secondary | ICD-10-CM | POA: Diagnosis not present

## 2017-11-13 DIAGNOSIS — I130 Hypertensive heart and chronic kidney disease with heart failure and stage 1 through stage 4 chronic kidney disease, or unspecified chronic kidney disease: Secondary | ICD-10-CM | POA: Diagnosis not present

## 2017-11-13 DIAGNOSIS — Z794 Long term (current) use of insulin: Secondary | ICD-10-CM | POA: Diagnosis not present

## 2017-11-13 DIAGNOSIS — N184 Chronic kidney disease, stage 4 (severe): Secondary | ICD-10-CM | POA: Diagnosis not present

## 2017-11-13 DIAGNOSIS — E1122 Type 2 diabetes mellitus with diabetic chronic kidney disease: Secondary | ICD-10-CM | POA: Diagnosis not present

## 2017-11-13 DIAGNOSIS — E785 Hyperlipidemia, unspecified: Secondary | ICD-10-CM | POA: Diagnosis not present

## 2017-11-13 DIAGNOSIS — I5023 Acute on chronic systolic (congestive) heart failure: Secondary | ICD-10-CM | POA: Diagnosis not present

## 2017-11-13 DIAGNOSIS — Z7982 Long term (current) use of aspirin: Secondary | ICD-10-CM | POA: Diagnosis not present

## 2017-11-13 LAB — POCT HEMOGLOBIN-HEMACUE: Hemoglobin: 9.6 g/dL — ABNORMAL LOW (ref 12.0–15.0)

## 2017-11-13 NOTE — Telephone Encounter (Signed)
RC from [place] w/ [facility] Pt is not taking Hydralazine or carvedilol since this weekend Says she 'bobbles' and is dizzy since the hydralazine has been changed to 50 mg BID She did not go to ED last week/weekend Please advise

## 2017-11-13 NOTE — Progress Notes (Signed)
Results for [name], [name] (MRN [id number]) as of 11/13/2017 10:27  Ref. Range 11/10/2017 13:08  Hemoglobin Latest Ref Range: 12.0 - 15.0 g/dL 9.6 (L)

## 2017-11-13 NOTE — Telephone Encounter (Signed)
Home health is going out to check patient and daughter will call if appointment is still needed after that.

## 2017-11-13 NOTE — Telephone Encounter (Signed)
lmtcb

## 2017-11-13 NOTE — Telephone Encounter (Signed)
Would recommend taking at least one or the other and we can titrate back down based on her BP.  From Nurse comments: Patient has discontinued the Coreg and hydralazine due to dizziness.  States that since hydralazine was changed to 50 mg she has had persistent dizziness.  Laroy Apple, MD [facility] 11/13/2017, 5:05 PM

## 2017-11-14 NOTE — Telephone Encounter (Signed)
Kathy aware and verbalizes understanding.

## 2017-11-16 ENCOUNTER — Ambulatory Visit (INDEPENDENT_AMBULATORY_CARE_PROVIDER_SITE_OTHER): Payer: Medicare Other | Admitting: Family Medicine

## 2017-11-16 ENCOUNTER — Encounter: Payer: Self-pay | Admitting: Family Medicine

## 2017-11-16 VITALS — BP 176/68 | HR 61 | Temp 96.7°F | Ht 63.0 in | Wt 136.6 lb

## 2017-11-16 DIAGNOSIS — I10 Essential (primary) hypertension: Secondary | ICD-10-CM | POA: Diagnosis not present

## 2017-11-16 DIAGNOSIS — R410 Disorientation, unspecified: Secondary | ICD-10-CM | POA: Diagnosis not present

## 2017-11-16 LAB — URINALYSIS, COMPLETE
Bilirubin, UA: NEGATIVE
Ketones, UA: NEGATIVE
NITRITE UA: NEGATIVE
RBC, UA: NEGATIVE
Specific Gravity, UA: 1.025 (ref 1.005–1.030)
UUROB: 0.2 mg/dL (ref 0.2–1.0)
pH, UA: 5.5 (ref 5.0–7.5)

## 2017-11-16 LAB — MICROSCOPIC EXAMINATION
BACTERIA UA: NONE SEEN
RBC, UA: NONE SEEN /hpf (ref 0–?)
Renal Epithel, UA: NONE SEEN /hpf

## 2017-11-16 MED ORDER — HYDRALAZINE HCL 25 MG PO TABS: 25.0000 mg | ORAL_TABLET | Freq: Three times a day (TID) | ORAL | 5 refills | Status: DC

## 2017-11-16 NOTE — Patient Instructions (Signed)
Great to see you!  Take 25 mg of hydrlazine 3 times a day ( not 10 mg or 50 mg).

## 2017-11-16 NOTE — Progress Notes (Signed)
   HPI  Patient presents today for follow-up hypertension.  We received a call from home health about 3 days ago stating that she felt dizzy whenever she took 50 mg of hydralazine and she was refusing to take hydralazine or Coreg. Her daughter states that on most days she has been taking all of her medications except they have changed hydralazine to 10 mg twice daily. Her blood pressure was elevated last week when she went to oncology  2 or 3 episodes of confusion recently.  She states that her "head feels funny". No behavior issues except for her accusing her daughter of wanting to kill her with her pills. Her daughter states that she set her pills out and had to go home, the patient states that she took her pills. She also states that she feels fine today and was aware that her "head felt funny".  PMH: Smoking status noted ROS: Per HPI  Objective: BP (!) 176/68   Pulse 61   Temp (!) 96.7 F (35.9 C) (Oral)   Ht 5\' 3"  (1.6 m)   Wt 136 lb 9.6 oz (62 kg)   BMI 24.20 kg/m  Gen: NAD, alert, cooperative with exam HEENT: NCAT CV: RRR, good S1/S2, no murmur Resp: CTABL, no wheezes, non-labored Ext: No edema, warm Neuro: Alert and oriented, No gross deficits  Assessment and plan:  #Hypertension Very elevated here today, they have changed hydralazine from 50 mg down to 10 mg. Titrate back up to 25 TID I believe she is taking her beta-blocker due to her heart rate.  # confusion 2-3 episodes lasting a few hours recently UA, culture, no signs of infection.  Discussed with daughter can treat with seroquel or haldol if becomes persistent but hopefully on temporary.   Orders Placed This Encounter  Procedures  . Urine Culture  . Urinalysis, Complete    Meds ordered this encounter  Medications  . hydrALAZINE (APRESOLINE) 25 MG tablet    Sig: Take 1 tablet (25 mg total) by mouth 3 (three) times daily.    Dispense:  90 tablet    Refill:  5    Please discontinue 50 mg and 10 mg  doses of hydralazine.    Laroy Apple, MD [facility] 11/16/2017, 12:08 PM

## 2017-11-17 LAB — URINE CULTURE

## 2017-11-20 ENCOUNTER — Other Ambulatory Visit: Payer: Self-pay | Admitting: Family Medicine

## 2017-11-22 DIAGNOSIS — N184 Chronic kidney disease, stage 4 (severe): Secondary | ICD-10-CM | POA: Diagnosis not present

## 2017-11-22 DIAGNOSIS — I5023 Acute on chronic systolic (congestive) heart failure: Secondary | ICD-10-CM | POA: Diagnosis not present

## 2017-11-22 DIAGNOSIS — I2510 Atherosclerotic heart disease of native coronary artery without angina pectoris: Secondary | ICD-10-CM | POA: Diagnosis not present

## 2017-11-22 DIAGNOSIS — I130 Hypertensive heart and chronic kidney disease with heart failure and stage 1 through stage 4 chronic kidney disease, or unspecified chronic kidney disease: Secondary | ICD-10-CM | POA: Diagnosis not present

## 2017-11-22 DIAGNOSIS — Z7982 Long term (current) use of aspirin: Secondary | ICD-10-CM | POA: Diagnosis not present

## 2017-11-22 DIAGNOSIS — E1122 Type 2 diabetes mellitus with diabetic chronic kidney disease: Secondary | ICD-10-CM | POA: Diagnosis not present

## 2017-11-22 DIAGNOSIS — Z794 Long term (current) use of insulin: Secondary | ICD-10-CM | POA: Diagnosis not present

## 2017-11-22 DIAGNOSIS — E785 Hyperlipidemia, unspecified: Secondary | ICD-10-CM | POA: Diagnosis not present

## 2017-11-24 ENCOUNTER — Encounter (HOSPITAL_COMMUNITY): Payer: Self-pay

## 2017-11-24 ENCOUNTER — Emergency Department (HOSPITAL_COMMUNITY): Payer: Medicare Other

## 2017-11-24 ENCOUNTER — Encounter (HOSPITAL_COMMUNITY)
Admission: RE | Admit: 2017-11-24 | Discharge: 2017-11-24 | Disposition: A | Discharge status: Home / Self Care | Payer: Medicare Other | Source: Ambulatory Visit | Attending: Nephrology | Admitting: Nephrology

## 2017-11-24 ENCOUNTER — Other Ambulatory Visit: Payer: Self-pay

## 2017-11-24 ENCOUNTER — Inpatient Hospital Stay (HOSPITAL_COMMUNITY): Payer: Medicare Other

## 2017-11-24 ENCOUNTER — Inpatient Hospital Stay (HOSPITAL_COMMUNITY)
Admission: EM | Admit: 2017-11-24 | Discharge: 2017-11-27 | DRG: 305 | Disposition: A | Discharge status: Home / Self Care | Payer: Medicare Other | Attending: Internal Medicine | Admitting: Internal Medicine

## 2017-11-24 ENCOUNTER — Encounter (HOSPITAL_COMMUNITY): Payer: Self-pay | Admitting: Emergency Medicine

## 2017-11-24 DIAGNOSIS — Z8249 Family history of ischemic heart disease and other diseases of the circulatory system: Secondary | ICD-10-CM

## 2017-11-24 DIAGNOSIS — Z66 Do not resuscitate: Secondary | ICD-10-CM | POA: Diagnosis present

## 2017-11-24 DIAGNOSIS — I5032 Chronic diastolic (congestive) heart failure: Secondary | ICD-10-CM | POA: Diagnosis not present

## 2017-11-24 DIAGNOSIS — I272 Pulmonary hypertension, unspecified: Secondary | ICD-10-CM | POA: Diagnosis not present

## 2017-11-24 DIAGNOSIS — I248 Other forms of acute ischemic heart disease: Secondary | ICD-10-CM | POA: Diagnosis present

## 2017-11-24 DIAGNOSIS — Z800 Family history of malignant neoplasm of digestive organs: Secondary | ICD-10-CM

## 2017-11-24 DIAGNOSIS — M19019 Primary osteoarthritis, unspecified shoulder: Secondary | ICD-10-CM | POA: Diagnosis not present

## 2017-11-24 DIAGNOSIS — E1122 Type 2 diabetes mellitus with diabetic chronic kidney disease: Secondary | ICD-10-CM | POA: Diagnosis not present

## 2017-11-24 DIAGNOSIS — I480 Paroxysmal atrial fibrillation: Secondary | ICD-10-CM | POA: Diagnosis not present

## 2017-11-24 DIAGNOSIS — R0602 Shortness of breath: Secondary | ICD-10-CM | POA: Diagnosis not present

## 2017-11-24 DIAGNOSIS — Z803 Family history of malignant neoplasm of breast: Secondary | ICD-10-CM

## 2017-11-24 DIAGNOSIS — I2510 Atherosclerotic heart disease of native coronary artery without angina pectoris: Secondary | ICD-10-CM | POA: Diagnosis present

## 2017-11-24 DIAGNOSIS — L8990 Pressure ulcer of unspecified site, unspecified stage: Secondary | ICD-10-CM | POA: Diagnosis present

## 2017-11-24 DIAGNOSIS — Z7982 Long term (current) use of aspirin: Secondary | ICD-10-CM

## 2017-11-24 DIAGNOSIS — I10 Essential (primary) hypertension: Secondary | ICD-10-CM | POA: Diagnosis present

## 2017-11-24 DIAGNOSIS — I132 Hypertensive heart and chronic kidney disease with heart failure and with stage 5 chronic kidney disease, or end stage renal disease: Secondary | ICD-10-CM | POA: Diagnosis not present

## 2017-11-24 DIAGNOSIS — Z833 Family history of diabetes mellitus: Secondary | ICD-10-CM | POA: Diagnosis not present

## 2017-11-24 DIAGNOSIS — N183 Chronic kidney disease, stage 3 (moderate): Secondary | ICD-10-CM | POA: Diagnosis not present

## 2017-11-24 DIAGNOSIS — IMO0001 Reserved for inherently not codable concepts without codable children: Secondary | ICD-10-CM

## 2017-11-24 DIAGNOSIS — R079 Chest pain, unspecified: Secondary | ICD-10-CM | POA: Diagnosis not present

## 2017-11-24 DIAGNOSIS — Z79890 Hormone replacement therapy (postmenopausal): Secondary | ICD-10-CM | POA: Diagnosis not present

## 2017-11-24 DIAGNOSIS — I160 Hypertensive urgency: Principal | ICD-10-CM | POA: Diagnosis present

## 2017-11-24 DIAGNOSIS — M47816 Spondylosis without myelopathy or radiculopathy, lumbar region: Secondary | ICD-10-CM | POA: Diagnosis not present

## 2017-11-24 DIAGNOSIS — I5033 Acute on chronic diastolic (congestive) heart failure: Secondary | ICD-10-CM | POA: Diagnosis not present

## 2017-11-24 DIAGNOSIS — D631 Anemia in chronic kidney disease: Secondary | ICD-10-CM | POA: Diagnosis present

## 2017-11-24 DIAGNOSIS — Z9861 Coronary angioplasty status: Secondary | ICD-10-CM

## 2017-11-24 DIAGNOSIS — Z794 Long term (current) use of insulin: Secondary | ICD-10-CM | POA: Diagnosis not present

## 2017-11-24 DIAGNOSIS — Z8673 Personal history of transient ischemic attack (TIA), and cerebral infarction without residual deficits: Secondary | ICD-10-CM | POA: Diagnosis not present

## 2017-11-24 DIAGNOSIS — R51 Headache: Secondary | ICD-10-CM | POA: Diagnosis not present

## 2017-11-24 DIAGNOSIS — E118 Type 2 diabetes mellitus with unspecified complications: Secondary | ICD-10-CM

## 2017-11-24 DIAGNOSIS — F0390 Unspecified dementia without behavioral disturbance: Secondary | ICD-10-CM | POA: Diagnosis present

## 2017-11-24 DIAGNOSIS — L89312 Pressure ulcer of right buttock, stage 2: Secondary | ICD-10-CM | POA: Diagnosis present

## 2017-11-24 DIAGNOSIS — N185 Chronic kidney disease, stage 5: Secondary | ICD-10-CM | POA: Diagnosis present

## 2017-11-24 DIAGNOSIS — L89322 Pressure ulcer of left buttock, stage 2: Secondary | ICD-10-CM | POA: Diagnosis present

## 2017-11-24 DIAGNOSIS — N184 Chronic kidney disease, stage 4 (severe): Secondary | ICD-10-CM | POA: Diagnosis not present

## 2017-11-24 DIAGNOSIS — Z888 Allergy status to other drugs, medicaments and biological substances status: Secondary | ICD-10-CM | POA: Diagnosis not present

## 2017-11-24 LAB — CBC WITH DIFFERENTIAL/PLATELET
BASOS ABS: 0 10*3/uL (ref 0.0–0.1)
BASOS PCT: 0 %
Eosinophils Absolute: 0.1 10*3/uL (ref 0.0–0.7)
Eosinophils Relative: 3 %
HEMATOCRIT: 32.1 % — AB (ref 36.0–46.0)
HEMOGLOBIN: 9.9 g/dL — AB (ref 12.0–15.0)
LYMPHS PCT: 24 %
Lymphs Abs: 0.9 10*3/uL (ref 0.7–4.0)
MCH: 29.2 pg (ref 26.0–34.0)
MCHC: 30.8 g/dL (ref 30.0–36.0)
MCV: 94.7 fL (ref 78.0–100.0)
MONO ABS: 0.5 10*3/uL (ref 0.1–1.0)
Monocytes Relative: 13 %
NEUTROS ABS: 2.3 10*3/uL (ref 1.7–7.7)
NEUTROS PCT: 60 %
Platelets: 172 10*3/uL (ref 150–400)
RBC: 3.39 MIL/uL — ABNORMAL LOW (ref 3.87–5.11)
RDW: 13.8 % (ref 11.5–15.5)
WBC: 3.7 10*3/uL — ABNORMAL LOW (ref 4.0–10.5)

## 2017-11-24 LAB — TROPONIN I
TROPONIN I: 0.12 ng/mL — AB (ref <0.03)
TROPONIN I: 0.14 ng/mL — AB (ref <0.03)
Troponin I: 0.11 ng/mL (ref <0.03)

## 2017-11-24 LAB — CBG MONITORING, ED: Glucose-Capillary: 140 mg/dL — ABNORMAL HIGH (ref 65–99)

## 2017-11-24 LAB — COMPREHENSIVE METABOLIC PANEL
ALBUMIN: 3.8 g/dL (ref 3.5–5.0)
ALT: 8 U/L — ABNORMAL LOW (ref 14–54)
ANION GAP: 12 (ref 5–15)
AST: 10 U/L — ABNORMAL LOW (ref 15–41)
Alkaline Phosphatase: 50 U/L (ref 38–126)
BILIRUBIN TOTAL: 0.6 mg/dL (ref 0.3–1.2)
BUN: 53 mg/dL — ABNORMAL HIGH (ref 6–20)
CO2: 24 mmol/L (ref 22–32)
Calcium: 9.9 mg/dL (ref 8.9–10.3)
Chloride: 104 mmol/L (ref 101–111)
Creatinine, Ser: 3.4 mg/dL — ABNORMAL HIGH (ref 0.44–1.00)
GFR calc non Af Amer: 12 mL/min — ABNORMAL LOW (ref >60)
GFR, EST AFRICAN AMERICAN: 13 mL/min — AB (ref >60)
GLUCOSE: 146 mg/dL — AB (ref 65–99)
POTASSIUM: 4.3 mmol/L (ref 3.5–5.1)
SODIUM: 140 mmol/L (ref 135–145)
TOTAL PROTEIN: 6.8 g/dL (ref 6.5–8.1)

## 2017-11-24 LAB — PROTIME-INR
INR: 1.03
PROTHROMBIN TIME: 13.4 s (ref 11.4–15.2)

## 2017-11-24 LAB — APTT: APTT: 34 s (ref 24–36)

## 2017-11-24 LAB — HEPARIN LEVEL (UNFRACTIONATED): HEPARIN UNFRACTIONATED: 0.51 [IU]/mL (ref 0.30–0.70)

## 2017-11-24 LAB — POCT HEMOGLOBIN-HEMACUE: HEMOGLOBIN: 11.5 g/dL — AB (ref 12.0–15.0)

## 2017-11-24 MED ORDER — HEPARIN BOLUS VIA INFUSION
4000.0000 [IU] | Freq: Once | INTRAVENOUS | Status: AC
  Administered 2017-11-24: 4000 [IU] via INTRAVENOUS

## 2017-11-24 MED ORDER — LORAZEPAM 2 MG/ML IJ SOLN
1.0000 mg | Freq: Once | INTRAMUSCULAR | Status: AC
  Administered 2017-11-24: 1 mg via INTRAVENOUS

## 2017-11-24 MED ORDER — HEPARIN (PORCINE) IN NACL 100-0.45 UNIT/ML-% IJ SOLN
750.0000 [IU]/h | INTRAMUSCULAR | Status: DC
  Administered 2017-11-24: 750 [IU]/h via INTRAVENOUS
  Filled 2017-11-24: qty 250

## 2017-11-24 MED ORDER — NITROGLYCERIN IN D5W 200-5 MCG/ML-% IV SOLN
5.0000 ug/min | Freq: Once | INTRAVENOUS | Status: AC
  Administered 2017-11-24: 5 ug/min via INTRAVENOUS
  Filled 2017-11-24: qty 250

## 2017-11-24 MED ORDER — LORAZEPAM 2 MG/ML IJ SOLN
INTRAMUSCULAR | Status: AC
  Filled 2017-11-24: qty 1

## 2017-11-24 MED ORDER — ASPIRIN 81 MG PO CHEW
324.0000 mg | CHEWABLE_TABLET | Freq: Once | ORAL | Status: AC
  Administered 2017-11-24: 324 mg via ORAL
  Filled 2017-11-24: qty 4

## 2017-11-24 MED ORDER — BACITRACIN ZINC 500 UNIT/GM EX OINT
TOPICAL_OINTMENT | CUTANEOUS | Status: AC
  Administered 2017-11-25: 02:00:00
  Filled 2017-11-24: qty 0.9

## 2017-11-24 MED ORDER — NICARDIPINE HCL IN NACL 20-0.86 MG/200ML-% IV SOLN
3.0000 mg/h | INTRAVENOUS | Status: DC
  Administered 2017-11-24: 5 mg/h via INTRAVENOUS
  Administered 2017-11-25: 7.5 mg/h via INTRAVENOUS
  Administered 2017-11-25: 2.5 mg/h via INTRAVENOUS
  Filled 2017-11-24 (×3): qty 200

## 2017-11-24 MED ORDER — HALOPERIDOL LACTATE 5 MG/ML IJ SOLN
2.0000 mg | Freq: Once | INTRAMUSCULAR | Status: AC
  Administered 2017-11-24: 2 mg via INTRAVENOUS
  Filled 2017-11-24: qty 1

## 2017-11-24 NOTE — ED Notes (Signed)
Pt began getting extremely agitated. Was trying to leave and becoming aggressive

## 2017-11-24 NOTE — ED Provider Notes (Signed)
[facility] EMERGENCY DEPARTMENT Provider Note   CSN: [id number] Arrival date & time: 11/24/17  1153     History   Chief Complaint Chief Complaint  Patient presents with  . Chest Pain    HPI [name] is a 83 y.o. female.  She presents for evaluation of chest pain and shortness of breath, which started yesterday.  She is taking all of her usual medications, but did not eat today.  She reports being nauseated today but has not vomited.  She denies diaphoresis, shortness of breath, weakness or dizziness.  She does not recall having problems like this before.  The patient's daughter talk to me outside of the patient's room, and stated that she was concerned that the patient has had some episodes of confusion, last week.  There was a time when the patient accused the daughter of poisoning her, because the patient thought that the medicine that the daughter was helping her take, was poison.  Also she has been wandering in the house, and being overly suspicious.  The daughter has spoken with the patient's primary care provider about the possibility of her mother having "dementia."  There have been no other known behavioral changes.  Last week the patient told her physician that when she takes hydralazine 50 mg, it makes her dizzy so he changed her hydralazine dose to 25 mg 3 times daily.  She also stopped taking her Coreg last week according to a note from her PCP.  The patient had an admission, to the hospital 1 month ago during which time she was diagnosed with acute encephalopathy which improved after treatment for UTI, and stabilization of her medical condition.  Other active issues at that time included metabolic instability, acute renal failure, and dysphasia.  Today the patient went to the outpatient area of [facility], for lab check, hemoglobin.  It returned 11.5.  She was noted to have high blood pressure, and her daughter chose to bring the patient here for evaluation  rather than go to her primary care doctor.  According to the daughter who states that she is "a CNA," the patient is currently taking hydralazine to 10 mg tablets twice a day, and carvedilol twice a day.  The daughter states that she is "trying to use up some of the old medication."  The daughter was noted to be somewhat confused during discussions of the recent medication changes.  HPI  Past Medical History:  Diagnosis Date  . CAD (coronary artery disease) 2003   Last catheterization 2008. Two-vessel PCI of the first OM branch and mid LAD.  Marland Kitchen CKD (chronic kidney disease)   . Congestive heart failure ([place]) 02/2017   grade 2 diastolic dysfunction  . CVA (cerebral vascular accident) ([place]) [place]   . Dementia   . DJD (degenerative joint disease) of lumbar spine   . Hypercholesteremia   . Hypertension   . IDDM (insulin dependent diabetes mellitus) [facility])     Patient Active Problem List   Diagnosis Date Noted  . Acute encephalopathy 10/15/2017  . CHF exacerbation ([place]) 09/23/2017  . Acute renal failure superimposed on stage 4 chronic kidney disease ([place]) 09/22/2017  . CAD (coronary artery disease), native coronary artery 09/21/2017  . Pleural effusion, bilateral   . Acute on chronic diastolic CHF (congestive heart failure) ([place]) 09/20/2017  . Thrombocytopenia ([place]) 09/20/2017  . Abnormal CT scan of lung   . Chest pain 03/04/2017  . Esophagitis   . Occult blood  in stools 11/29/2016  . UTI (urinary tract infection) 11/29/2016  . Urinary retention 11/29/2016  . PAF (paroxysmal atrial fibrillation) ([place])   . Pressure injury of skin 11/22/2016  . Aortic valve sclerosis-2D June 2016 10/05/2016  . Chest pain with high risk of acute coronary syndrome 10/05/2016  . 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 Pulmonary hypertension ([place])   . Insulin dependent diabetes mellitus with complications ([place])   . Chronic kidney disease, stage IV (severe) ([place])   . Other constipation   .  Failure to thrive in adult   . Hypomagnesemia   . CAD S/P PCI '03 and '08 05/11/2015  . Anemia in chronic kidney disease 05/11/2015  . Benign paroxysmal positional vertigo 03/21/2014  . HTN (hypertension) 08/09/2013  . Lumbar spondylosis 05/16/2013  . Overweight 05/15/2013  . Varicose veins of lower extremities with other complications 16/08/9603  . Normocytic anemia 10/29/2012  . Hypoglycemia 10/29/2012  . NSVT (nonsustained ventricular tachycardia) ([place]) 10/28/2012  . OA (osteoarthritis) 10/27/2012  . Cerebrovascular disease 03/09/2010  . Hyperlipidemia 12/05/2008    Past Surgical History:  Procedure Laterality Date  . APPENDECTOMY    . KNEE SURGERY      OB History    Gravida Para Term Preterm AB Living             5   SAB TAB Ectopic Multiple Live Births                   Home Medications    Prior to Admission medications   Medication Sig Start Date End Date Taking? Authorizing Provider  aspirin EC 81 MG EC tablet Take 1 tablet (81 mg total) by mouth daily. 10/08/16  Yes Bhagat, Bhavinkumar, PA  carvedilol (COREG) 25 MG tablet Take 1 tablet (25 mg total) by mouth 2 (two) times daily with a meal. 09/24/17  Yes Tat, David, MD  cholecalciferol (VITAMIN D) 1000 units tablet Take 1,000 Units by mouth daily.   Yes [provider]  cloNIDine (CATAPRES) 0.1 MG tablet Take 1 tablet by mouth 2 (two) times daily. 09/22/14  Yes [provider]  diclofenac sodium (VOLTAREN) 1 % GEL Apply 4 g topically 4 (four) times daily. Patient taking differently: Apply 4 g topically 4 (four) times daily as needed (FOR PAIN).  07/29/16  Yes Timmothy Euler, MD  hydrALAZINE (APRESOLINE) 10 MG tablet Take 10 mg by mouth 2 (two) times daily.   Yes [provider]  HYDROcodone-acetaminophen (NORCO) 5-325 MG tablet Take 0.5-1 tablets every 6 (six) hours as needed by mouth for moderate pain. 10/09/17  Yes Timmothy Euler, MD  insulin NPH Human (HUMULIN N) 100 UNIT/ML  injection Inject 0.1 mLs (10 Units total) into the skin 2 (two) times daily before a meal. Patient taking differently: Inject 10 Units into the skin 2 (two) times daily before a meal. GIVES BASED ON BLOOD SUGAR LEVELS. IF UNDER 120-NO INSULIN IS GIVEN 02/03/17  Yes Timmothy Euler, MD  isosorbide mononitrate (IMDUR) 120 MG 24 hr tablet TAKE 1 TABLET DAILY 07/07/17  Yes Timmothy Euler, MD  levothyroxine (SYNTHROID, LEVOTHROID) 50 MCG tablet TAKE 1 TABLET DAILY 04/03/17  Yes Timmothy Euler, MD  nitroGLYCERIN (NITROSTAT) 0.4 MG SL tablet PLACE 1 TABLET UNDER THE TONGUE AT ONSET OF CHEST PAIN EVERY 5 MINTUES UP TO 3 TIMES AS NEEDED 02/27/17  Yes Timmothy Euler, MD  pantoprazole (PROTONIX) 40 MG tablet Take 1 tablet (40 mg total) by mouth 2 (two) times daily. X  one month, then once daily Patient taking differently: Take 40 mg by mouth daily. X one month, then once daily 09/24/17  Yes Tat, David, MD  polyethylene glycol powder (GLYCOLAX/MIRALAX) powder MIX 1 CAPFUL (17 GRAMS) WITH 8OZ OF WATER OR JUICE DAILY. Patient taking differently: MIX 1 CAPFUL (17 GRAMS) WITH 8OZ OF WATER OR JUICE DAILY AS NEEDED FOR CONSTIPATION 12/13/16  Yes Timmothy Euler, MD  rosuvastatin (CRESTOR) 10 MG tablet TAKE 1 TABLET DAILY 11/22/17  Yes Timmothy Euler, MD  sodium polystyrene (KAYEXALATE) 15 GM/60ML suspension Take 60 mLs (15 g total) by mouth daily. Patient taking differently: Take 15 g by mouth daily as needed.  03/17/17  Yes Timmothy Euler, MD  sucralfate (CARAFATE) 1 GM/10ML suspension Take 10 mLs (1 g total) by mouth every 6 (six) hours. Patient taking differently: Take 1 g by mouth daily as needed.  03/27/17  Yes Timmothy Euler, MD  torsemide (DEMADEX) 20 MG tablet Take 1 tablet (20 mg total) by mouth 2 (two) times daily. 09/24/17  Yes TatShanon Brow, MD    Family History Family History  Problem Relation Age of Onset  . Heart attack Mother 26  . Heart attack Father 53  . Diabetes Brother         RETINOPATHY   . Drug abuse Brother   . Liver cancer Brother   . Breast cancer Daughter   . Diabetes Sister     Social History Social History   Tobacco Use  . Smoking status: Never Smoker  . Smokeless tobacco: Never Used  Substance Use Topics  . Alcohol use: No  . Drug use: No     Allergies   Propoxyphene n-acetaminophen and Simvastatin   Review of Systems Review of Systems  All other systems reviewed and are negative.    Physical Exam Updated Vital Signs BP (!) 186/62   Pulse 68   Temp 97.9 F (36.6 C) (Oral)   Resp (!) 9   Ht 5\' 6"  (1.676 m)   Wt 61.2 kg (135 lb)   SpO2 97%   BMI 21.79 kg/m   Physical Exam  Constitutional: She is oriented to person, place, and time. She appears well-developed. She does not appear ill.  Elderly, frail  HENT:  Head: Normocephalic and atraumatic.  Eyes: Conjunctivae and EOM are normal. Pupils are equal, round, and reactive to light.  Neck: Normal range of motion and phonation normal. Neck supple.  Cardiovascular: Normal rate and regular rhythm.  Pulmonary/Chest: Effort normal and breath sounds normal. She exhibits no tenderness.  Abdominal: Soft. She exhibits no distension. There is no tenderness. There is no guarding.  Musculoskeletal: Normal range of motion.  Neurological: She is alert and oriented to person, place, and time. She exhibits normal muscle tone.  Skin: Skin is warm and dry.  Psychiatric: She has a normal mood and affect. Her behavior is normal. Judgment and thought content normal.  Nursing note and vitals reviewed.    ED Treatments / Results  Labs (all labs ordered are listed, but only abnormal results are displayed) Labs Reviewed  COMPREHENSIVE METABOLIC PANEL - Abnormal; Notable for the following components:      Result Value   Glucose, Bld 146 (*)    BUN 53 (*)    Creatinine, Ser 3.40 (*)    AST 10 (*)    ALT 8 (*)    GFR calc non Af Amer 12 (*)    GFR calc Af Amer 13 (*)    All  other  components within normal limits  CBC WITH DIFFERENTIAL/PLATELET - Abnormal; Notable for the following components:   WBC 3.7 (*)    RBC 3.39 (*)    Hemoglobin 9.9 (*)    HCT 32.1 (*)    All other components within normal limits  TROPONIN I - Abnormal; Notable for the following components:   Troponin I 0.11 (*)    All other components within normal limits  CBG MONITORING, ED - Abnormal; Notable for the following components:   Glucose-Capillary 140 (*)    All other components within normal limits  APTT  PROTIME-INR  HEPARIN LEVEL (UNFRACTIONATED)  TROPONIN I    EKG  EKG Interpretation  Date/Time:  Friday November 24 2017 11:55:00 EST Ventricular Rate:  80 PR Interval:    QRS Duration: 120 QT Interval:  445 QTC Calculation: 514 R Axis:   -32 Text Interpretation:  Sinus rhythm Prolonged PR interval Incomplete left bundle branch block LVH with secondary repolarization abnormality Anterior Q waves, possibly due to LVH Prolonged QT interval Baseline wander in lead(s) III aVF Since last tracing QT has lengthened Confirmed by Daleen Bo [phone]) on 11/24/2017 1:59:57 PM          Radiology Dg Chest 2 View  Result Date: 11/24/2017 CLINICAL DATA:  Mid chest pain for 1 day EXAM: CHEST  2 VIEW COMPARISON:  10/15/2017 FINDINGS: Cardiac shadow is stable. Tortuosity of the aorta with calcification is noted but stable. The lungs are well aerated bilaterally. Minimal scarring is noted in the bases. Previously seen effusions have resolved. No new focal abnormality is noted. IMPRESSION: No active cardiopulmonary disease. Electronically Signed   By: Inez Catalina M.D.   On: 11/24/2017 12:27    Procedures .Critical Care Performed by: Daleen Bo, MD Authorized by: Daleen Bo, MD   Critical care provider statement:    Critical care time (minutes):  70   Critical care start time:  11/24/2017 4:04 PM   Critical care end time:  11/24/2017 3:59 PM   Critical care time was exclusive  of:  Separately billable procedures and treating other patients   Critical care was necessary to treat or prevent imminent or life-threatening deterioration of the following conditions:  Cardiac failure   Critical care was time spent personally by me on the following activities:  Blood draw for specimens, development of treatment plan with patient or surrogate, discussions with consultants, evaluation of patient's response to treatment, examination of patient, obtaining history from patient or surrogate, ordering and performing treatments and interventions, ordering and review of laboratory studies, ordering and review of radiographic studies, pulse oximetry, re-evaluation of patient's condition and review of old charts   (including critical care time)  Medications Ordered in ED Medications  bacitracin 500 UNIT/GM ointment (not administered)  heparin ADULT infusion 100 units/mL (25000 units/280mL sodium chloride 0.45%) (750 Units/hr Intravenous New Bag/Given 11/24/17 1524)  aspirin chewable tablet 324 mg (324 mg Oral Given 11/24/17 1400)  nitroGLYCERIN 50 mg in dextrose 5 % 250 mL (0.2 mg/mL) infusion (35 mcg/min Intravenous Rate/Dose Change 11/24/17 1528)  heparin bolus via infusion 4,000 Units (4,000 Units Intravenous Bolus from Bag 11/24/17 1524)     Initial Impression / Assessment and Plan / ED Course  I have reviewed the triage vital signs and the nursing notes.  Pertinent labs & imaging results that were available during my care of the patient were reviewed by me and considered in my medical decision making (see chart for details).  Clinical Course as of Nov 24 1558  Fri Nov 24, 2017  1404 First troponin is elevated at 0.11.  Oral aspirin given.  Charting review indicates also elevated creatinine, close to baseline.  Prior troponin levels were normal.  She has not had recent cardiology intervention or evaluation.  Will repeat EKG.  [EW]  2956 Charting indicates prior coronary disease,  remotely.  Patient continues to be hypertensive.  Will initiate treatment for ACS with IV nitroglycerin and heparin.  [EW]  1524 At this time she is feeling better and states that she has less headache.  She does not complain of chest pain at this time.  Blood pressure now 186/62, on nitroglycerin drip at 30 mcg/min.  [EW]  1529 Elevated Creatinine: (!) 3.40 [EW]  1529 Elevated Troponin I: (!!) 0.11 [EW]  1530 Low Hemoglobin: (!) 9.9 [EW]  1530 Normal DG Chest 2 View [EW]  1556 Case details discussed in depth with cardiology, Dr. Domenic Polite.  He agrees with primary treatment for hypertensive urgency, with cycling troponins, and possibly repeating her cardiac echo.  At this time there is no indication for transfer and/or cardiac catheterization.  [EW]    Clinical Course User Index [EW] Daleen Bo, MD   Hemoglobin  Date Value Ref Range Status  11/24/2017 9.9 (L) 12.0 - 15.0 g/dL Final  11/24/2017 11.5 (L) 12.0 - 15.0 g/dL Final  11/10/2017 9.6 (L) 12.0 - 15.0 g/dL Final  10/24/2017 8.9 (L) 12.0 - 15.0 g/dL Final  10/23/2017 8.3 (<) 11.1 - 15.9 g/dL Final    Comment:                      Client Requested Flag  10/09/2017 8.2 (<) 11.1 - 15.9 g/dL Final    Comment:                      Client Requested Flag  09/18/2017 9.0 (L) 11.1 - 15.9 g/dL Final  07/18/2017 9.1 (L) 11.1 - 15.9 g/dL Final    Patient Vitals for the past 24 hrs:  BP Temp Temp src Pulse Resp SpO2 Height Weight  11/24/17 1500 (!) 186/62 - - 68 (!) 9 97 % - -  11/24/17 1430 (!) 200/71 - - 74 16 98 % - -  11/24/17 1400 (!) 207/71 - - 80 14 96 % - -  11/24/17 1330 (!) 214/85 - - 81 12 99 % - -  11/24/17 1300 (!) 209/79 - - 85 17 98 % - -  11/24/17 1246 (!) 213/74 - - 78 19 98 % - -  11/24/17 1231 (!) 213/74 - - 78 11 99 % - -  11/24/17 1200 - - - 75 12 98 % - -  11/24/17 1156 (!) 217/72 97.9 F (36.6 C) Oral 77 18 100 % 5\' 6"  (1.676 m) 61.2 kg (135 lb)    3:58 PM Reevaluation with update and discussion. After  initial assessment and treatment, an updated evaluation reveals patient remains comfortable, findings discussed they are agreeable with the plan. Daleen Bo    4:00 PM-Consult complete with Hospitalist. Patient case explained and discussed. She agrees to admit patient for further evaluation and treatment. Call ended at 16:26  Final Clinical Impressions(s) / ED Diagnoses   Final diagnoses:  Hypertensive urgency   Nonspecific headache and chest pain with elevated blood pressure, and elevated initial troponin.  Evaluation most consistent with hypertensive urgency, with troponin leak.  Patient had stress test, April 2018 with cardiac echo, which were both reassuring  for low risk cardiac status.  Recently she has had difficulty taking her regular blood pressure medications, due to her confusion and mistrust.    Nursing Notes Reviewed/ Care Coordinated Applicable Imaging Reviewed Interpretation of Laboratory Data incorporated into ED treatment  Plan: [place]  ED Discharge Orders    None       Daleen Bo, MD 11/24/17 [phone]

## 2017-11-24 NOTE — ED Notes (Signed)
Pt is moaning and upset that nobody can sit with her in her room. Have notified her that I will be checking on her frequently but am not able to sit in her room the whole time due to having other patients

## 2017-11-24 NOTE — Progress Notes (Signed)
Results for MURNA, BACKER (MRN [id number]) as of 11/24/2017 11:52  No procrit given. Patient is sent to the ER for chest pain and headache. Desires to be seen in ER due to primary  medical MD unable to see pt today. BP elevated 228/62 HR 83 Resp. 20 SAO@ 99% No appointment made for follow up at this time.   Ref. Range 11/24/2017 11:28  Hemoglobin Latest Ref Range: 12.0 - 15.0 g/dL 11.5 (L)

## 2017-11-24 NOTE — H&P (Signed)
History and Physical    [name] YKD:[id number] DOB: 09-Jun-1932 DOA: 11/24/2017  PCP: Timmothy Euler, MD   Patient coming from: Home  Chief Complaint: chest pain  HPI: [name] is a 83 y.o. female with medical history significant for HTN, pulmonary hypertension, PAF dm, CHF- diastolic,  CKD 4.  Patient was brought to the ED today with complaints of chest pain and headache.  As of the time of my evaluation patient reports her headache and chest pain had resolved.  Chest pain is nonradiating.  She denied associated back or arm pain.  Patient denied associated shortness of breath no nausea vomiting no diaphoresis. In the ED patient's blood pressure was markedly elevated to 217/72.  Patient's daughter reports compliance with home blood pressure medications Imdur, coreg,  Hydralazine 25 BID.  Daughter also reports intermittent  chest pains and headaches are not new.  Per daughter for the past months patient has had intermittent confusion, asking about "a baby or child", is paranoid, says daughter "is poisoning her and giving her too many medications.  She has been evaluated by her PCP for this.  ED Course: Blood pressure elevated 217/72, EKG without significant change from prior.  Creatinine elevated at 3.4, about patient's baseline.  Troponin elevated at 0.1.  Chest x-ray negative for acute abnormality. EDP consulted [place] cardiology- Dr. Conni Elliot, who felt troponin elevation likely related to hypertensive urgency.  Pt was started on heparin drip and nitroglycerin drip in the ED. Hospitalist was called to admit for hypertensive urgency.  Review of Systems: As per HPI otherwise 10 point review of systems negative.  Past Medical History:  Diagnosis Date  . CAD (coronary artery disease) 2003   Last catheterization 2008. Two-vessel PCI of the first OM branch and mid LAD.  Marland Kitchen CKD (chronic kidney disease)   . Congestive heart failure ([place]) 02/2017   grade 2 diastolic dysfunction    . CVA (cerebral vascular accident) ([place]) [place]   . Dementia   . DJD (degenerative joint disease) of lumbar spine   . Hypercholesteremia   . Hypertension   . IDDM (insulin dependent diabetes mellitus) ([place])     Past Surgical History:  Procedure Laterality Date  . APPENDECTOMY    . KNEE SURGERY       reports that  has never smoked. she has never used smokeless tobacco. She reports that she does not drink alcohol or use drugs.  Allergies  Allergen Reactions  . Propoxyphene N-Acetaminophen     Pt does not know reaction  . Simvastatin Other (See Comments)    headache    Family History  Problem Relation Age of Onset  . Heart attack Mother 29  . Heart attack Father 75  . Diabetes Brother        RETINOPATHY   . Drug abuse Brother   . Liver cancer Brother   . Breast cancer Daughter   . Diabetes Sister     Prior to Admission medications   Medication Sig Start Date End Date Taking? Authorizing Provider  aspirin EC 81 MG EC tablet Take 1 tablet (81 mg total) by mouth daily. 10/08/16  Yes Bhagat, Bhavinkumar, PA  carvedilol (COREG) 25 MG tablet Take 1 tablet (25 mg total) by mouth 2 (two) times daily with a meal. 09/24/17  Yes Tat, David, MD  cholecalciferol (VITAMIN D) 1000 units tablet Take 1,000 Units by mouth daily.   Yes [provider]  cloNIDine (CATAPRES) 0.1 MG tablet Take  1 tablet by mouth 2 (two) times daily. 09/22/14  Yes [provider]  diclofenac sodium (VOLTAREN) 1 % GEL Apply 4 g topically 4 (four) times daily. Patient taking differently: Apply 4 g topically 4 (four) times daily as needed (FOR PAIN).  07/29/16  Yes Timmothy Euler, MD  hydrALAZINE (APRESOLINE) 10 MG tablet Take 10 mg by mouth 2 (two) times daily.   Yes [provider]  HYDROcodone-acetaminophen (NORCO) 5-325 MG tablet Take 0.5-1 tablets every 6 (six) hours as needed by mouth for moderate pain. 10/09/17  Yes Timmothy Euler, MD  insulin NPH Human (HUMULIN  N) 100 UNIT/ML injection Inject 0.1 mLs (10 Units total) into the skin 2 (two) times daily before a meal. Patient taking differently: Inject 10 Units into the skin 2 (two) times daily before a meal. GIVES BASED ON BLOOD SUGAR LEVELS. IF UNDER 120-NO INSULIN IS GIVEN 02/03/17  Yes Timmothy Euler, MD  isosorbide mononitrate (IMDUR) 120 MG 24 hr tablet TAKE 1 TABLET DAILY 07/07/17  Yes Timmothy Euler, MD  levothyroxine (SYNTHROID, LEVOTHROID) 50 MCG tablet TAKE 1 TABLET DAILY 04/03/17  Yes Timmothy Euler, MD  nitroGLYCERIN (NITROSTAT) 0.4 MG SL tablet PLACE 1 TABLET UNDER THE TONGUE AT ONSET OF CHEST PAIN EVERY 5 MINTUES UP TO 3 TIMES AS NEEDED 02/27/17  Yes Timmothy Euler, MD  pantoprazole (PROTONIX) 40 MG tablet Take 1 tablet (40 mg total) by mouth 2 (two) times daily. X one month, then once daily Patient taking differently: Take 40 mg by mouth daily. X one month, then once daily 09/24/17  Yes Tat, David, MD  polyethylene glycol powder (GLYCOLAX/MIRALAX) powder MIX 1 CAPFUL (17 GRAMS) WITH 8OZ OF WATER OR JUICE DAILY. Patient taking differently: MIX 1 CAPFUL (17 GRAMS) WITH 8OZ OF WATER OR JUICE DAILY AS NEEDED FOR CONSTIPATION 12/13/16  Yes Timmothy Euler, MD  rosuvastatin (CRESTOR) 10 MG tablet TAKE 1 TABLET DAILY 11/22/17  Yes Timmothy Euler, MD  sodium polystyrene (KAYEXALATE) 15 GM/60ML suspension Take 60 mLs (15 g total) by mouth daily. Patient taking differently: Take 15 g by mouth daily as needed.  03/17/17  Yes Timmothy Euler, MD  sucralfate (CARAFATE) 1 GM/10ML suspension Take 10 mLs (1 g total) by mouth every 6 (six) hours. Patient taking differently: Take 1 g by mouth daily as needed.  03/27/17  Yes Timmothy Euler, MD  torsemide (DEMADEX) 20 MG tablet Take 1 tablet (20 mg total) by mouth 2 (two) times daily. 09/24/17  Deveron Furlong, MD    Physical Exam: Vitals:   11/24/17 1600 11/24/17 1630 11/24/17 1700 11/24/17 1730  BP: (!) 206/63 (!) 195/73 (!) 194/57 (!)  204/90  Pulse: 74 72 73 89  Resp: 14 15 19 15   Temp:      TempSrc:      SpO2: 97% 97% 98% 97%  Weight:      Height:        Constitutional: NAD, calm, comfortable Vitals:   11/24/17 1600 11/24/17 1630 11/24/17 1700 11/24/17 1730  BP: (!) 206/63 (!) 195/73 (!) 194/57 (!) 204/90  Pulse: 74 72 73 89  Resp: 14 15 19 15   Temp:      TempSrc:      SpO2: 97% 97% 98% 97%  Weight:      Height:       Eyes: PERRL, lids and conjunctivae normal ENMT: Mucous membranes are moist. Posterior pharynx clear of any exudate or lesions.Normal dentition.  Neck: normal, supple,  no masses, no thyromegaly Respiratory: clear to auscultation bilaterally, no wheezing, no crackles. Normal respiratory effort. No accessory muscle use.  Cardiovascular: Regular rate and rhythm, 3/6 Systolic murmur- left upper sternal border. Trace to +1 pitting pedal edema- left leg. 2+ pedal pulses. No carotid bruits.  Abdomen: no tenderness, no masses palpated. No hepatosplenomegaly. Bowel sounds positive.  Musculoskeletal: no clubbing / cyanosis. No joint deformity upper and lower extremities. Good ROM, no contractures. Normal muscle tone.  Skin: no rashes, lesions, ulcers. No induration Neurologic: CN 2-12 grossly intact. Strength 4/5- Right upper and lower extremity- chronic per pt, 5/5 other extremities.  Psychiatric: Normal judgment and insight. Alert and oriented x 3. Normal mood.   Labs on Admission: I have personally reviewed following labs and imaging studies  CBC: Recent Labs  Lab 11/24/17 1128 11/24/17 1245  WBC  --  3.7*  NEUTROABS  --  2.3  HGB 11.5* 9.9*  HCT  --  32.1*  MCV  --  94.7  PLT  --  176   Basic Metabolic Panel: Recent Labs  Lab 11/24/17 1245  NA 140  K 4.3  CL 104  CO2 24  GLUCOSE 146*  BUN 53*  CREATININE 3.40*  CALCIUM 9.9   GFR: Estimated Creatinine Clearance: 11.7 mL/min (A) (by C-G formula based on SCr of 3.4 mg/dL (H)). Liver Function Tests: Recent Labs  Lab  11/24/17 1245  AST 10*  ALT 8*  ALKPHOS 50  BILITOT 0.6  PROT 6.8  ALBUMIN 3.8   Coagulation Profile: Recent Labs  Lab 11/24/17 1254  INR 1.03   Cardiac Enzymes: Recent Labs  Lab 11/24/17 1245 11/24/17 1551  TROPONINI 0.11* 0.12*   CBG: Recent Labs  Lab 11/24/17 1200  GLUCAP 140*   Urine analysis:    Component Value Date/Time   COLORURINE YELLOW 10/15/2017 0910   APPEARANCEUR Clear 11/16/2017 1213   LABSPEC 1.012 10/15/2017 0910   PHURINE 5.0 10/15/2017 0910   GLUCOSEU Trace (A) 11/16/2017 1213   HGBUR NEGATIVE 10/15/2017 0910   BILIRUBINUR Negative 11/16/2017 1213   KETONESUR NEGATIVE 10/15/2017 0910   PROTEINUR 3+ (A) 11/16/2017 1213   PROTEINUR 100 (A) 10/15/2017 0910   UROBILINOGEN negative 07/02/2015 1505   UROBILINOGEN 0.2 05/10/2015 1245   NITRITE Negative 11/16/2017 1213   NITRITE NEGATIVE 10/15/2017 0910   LEUKOCYTESUR Trace (A) 11/16/2017 1213   Radiological Exams on Admission: Dg Chest 2 View  Result Date: 11/24/2017 CLINICAL DATA:  Mid chest pain for 1 day EXAM: CHEST  2 VIEW COMPARISON:  10/15/2017 FINDINGS: Cardiac shadow is stable. Tortuosity of the aorta with calcification is noted but stable. The lungs are well aerated bilaterally. Minimal scarring is noted in the bases. Previously seen effusions have resolved. No new focal abnormality is noted. IMPRESSION: No active cardiopulmonary disease. Electronically Signed   By: Inez Catalina M.D.   On: 11/24/2017 12:27   EKG: Sinus Rhythm, change from prior  Assessment/Plan Principal Problem:   Hypertensive urgency Active Problems:   HTN (hypertension)   CAD S/P PCI '03 and '08   Pulmonary hypertension (HCC)   Insulin dependent diabetes mellitus with complications (HCC)   Chronic kidney disease, stage IV (severe) (HCC)   PAF (paroxysmal atrial fibrillation) (HCC)    Hypertensive urgency -blood pressure 217/72.  Chronically elevated. started on nitroglycerin drip in ED. troponin 0.1 likely  troponin leak 2/2 elevated BP.  Chest x-ray negative for dissection.  Chest pain unchanged from prior, resolved.  -Admit to stepdown, and to be transferred to  [facility], as no beds at [facility]. -Will switch to nicardipine drip -Resume home blood pressure medications Coreg, hydralazine, imdur, clonidine -Patient initially complained of severe headache, will get head CT, rule out IC hemorrhage as patient is on anticoagulation -Check blood pressure in both arms  Chest pain with CAD history-troponin 0.1, EKG no significant change from prior.  Hx of CAD- PCI- 2003, 2008.  Last echo 08/2017- Ef 60-65% G2DD,  -Started on heparin drip in the ED, will continue for now -EKG a.m -Trend troponin - NPO midnight - Cont home statin, aspirin  Paroxysmal A. Fib-not aware of this diagnosis patient is not on anticoagulation -Continue Coreg  Diastolic CHF-appears stable. Last echo 08/2017- Ef 60-65% G2DD. -Medications torsemide 20 mg twice daily, continue -Continue Coreg  DM-  - SSi -Resume home NPH at reduced dose 7u BID  CKD4-creatinine 3.4.  At baseline.  No signs of volume overload at this time. -Continue home torsemide   DVT prophylaxis: Heparin Code Status: DNR, confirmed with patient's daughter Iline Oven, (who is Pt decision maker, but no signed documents) on the phone.  Per daughter, was on hospice at some point. Family Communication: daughter Iline Oven Disposition Plan: To be determined Consults called: None Admission status: inpt, stepdown   Bethena Roys MD [facility] Pager [phone]  If 11PM-7AM, please contact night-coverage [email or web address] Password [facility]  11/24/2017, 6:26 PM

## 2017-11-24 NOTE — Progress Notes (Signed)
ANTICOAGULATION CONSULT NOTE - Initial Consult  Pharmacy Consult for HEPARIN Indication: chest pain/ACS  Allergies  Allergen Reactions  . Propoxyphene N-Acetaminophen     Pt does not know reaction  . Simvastatin Other (See Comments)    headache   Patient Measurements: Height: 5\' 6"  (167.6 cm) Weight: 135 lb (61.2 kg) IBW/kg (Calculated) : 59.3 HEPARIN DW (KG): 61.2  Vital Signs: Temp: 97.9 F (36.6 C) (12/28 1156) Temp Source: Oral (12/28 1156) BP: 186/62 (12/28 1500) Pulse Rate: 68 (12/28 1500)  Labs: Recent Labs    11/24/17 1128 11/24/17 1245  HGB 11.5* 9.9*  HCT  --  32.1*  PLT  --  172  CREATININE  --  3.40*  TROPONINI  --  0.11*   Estimated Creatinine Clearance: 11.7 mL/min (A) (by C-G formula based on SCr of 3.4 mg/dL (H)).  Medical History: Past Medical History:  Diagnosis Date  . CAD (coronary artery disease) 2003   Last catheterization 2008. Two-vessel PCI of the first OM branch and mid LAD.  Marland Kitchen CKD (chronic kidney disease)   . Congestive heart failure ([place]) 02/2017   grade 2 diastolic dysfunction  . CVA (cerebral vascular accident) ([place]) [place]   . Dementia   . DJD (degenerative joint disease) of lumbar spine   . Hypercholesteremia   . Hypertension   . IDDM (insulin dependent diabetes mellitus) ([place])    Medications:   (Not in a hospital admission)  Assessment: 83yo female c/o CP and headache associated with nausea.  Pharmacy asked to initiate Heparin for ACS  Goal of Therapy:  Heparin level 0.3-0.7 units/ml Monitor platelets by anticoagulation protocol: Yes   Plan:   Heparin 4000 units IV now x 1  Heparin infusion at 750 units/hr  Heparin level in 6-8 hrs then daily  CBC daily while on Heparin   [place] Crane, Dontavia Brand A 11/24/2017,3:23 PM

## 2017-11-24 NOTE — ED Notes (Signed)
Per Dr. Eulis Foster, start changing Nitro drip q15

## 2017-11-24 NOTE — ED Notes (Signed)
Verbal order to d/c Nitro per Dr. Denton Brick

## 2017-11-24 NOTE — Progress Notes (Signed)
Arrived today for a lab work, patient complains of chest pain with no radiation and severe headache. Pain given between a 8-9 on pain scale. Patient has a non productive cough, denies shortness of breath. BP elevated 228/62 HR 83 Respirations 20. Patient and daughter wish to be seen in the ER were unable to see primary MD this am when symptoms started.

## 2017-11-24 NOTE — ED Triage Notes (Signed)
PT c/o aching central chest pain with headache and nausea x1 day. PT was brought over to ED by Short Stay staff today. PT had hgb check today of 11 and no need for Procrit injection today.

## 2017-11-24 NOTE — ED Notes (Signed)
Pt off unit at this time via [facility]

## 2017-11-24 NOTE — ED Notes (Signed)
CRITICAL VALUE ALERT  Critical Value: Troponin 0.12   Date & Time Notied:  11/24/17 2025  Provider Notified: Notified MD   Orders Received/Actions taken: Notified Dr. Eulis Foster

## 2017-11-24 NOTE — ED Notes (Signed)
BP left arm: 200/73 BP right arm: 213/70

## 2017-11-24 NOTE — ED Notes (Signed)
[place]   [phone]

## 2017-11-24 NOTE — ED Notes (Signed)
Pt back from CT at this time 

## 2017-11-24 NOTE — ED Notes (Signed)
Report to [facility] 

## 2017-11-24 NOTE — ED Notes (Signed)
MD at bedside. 

## 2017-11-24 NOTE — ED Notes (Signed)
CRITICAL VALUE ALERT  Critical Value:  Troponin 0.11  Date & Time Notied:  11/24/17 1340  Provider Notified: Notified MD  Orders Received/Actions taken: Notified Dr. Eulis Foster

## 2017-11-24 NOTE — ED Notes (Signed)
Report to Isabella, RN @ [facility]

## 2017-11-24 NOTE — ED Notes (Signed)
Pt off unit for CT at this time.

## 2017-11-24 NOTE — ED Notes (Signed)
DNR armband placed on pt ?

## 2017-11-25 ENCOUNTER — Other Ambulatory Visit: Payer: Self-pay

## 2017-11-25 LAB — GLUCOSE, CAPILLARY
GLUCOSE-CAPILLARY: 125 mg/dL — AB (ref 65–99)
GLUCOSE-CAPILLARY: 157 mg/dL — AB (ref 65–99)
GLUCOSE-CAPILLARY: 70 mg/dL (ref 65–99)
Glucose-Capillary: 155 mg/dL — ABNORMAL HIGH (ref 65–99)
Glucose-Capillary: 102 mg/dL — ABNORMAL HIGH (ref 65–99)
Glucose-Capillary: 148 mg/dL — ABNORMAL HIGH (ref 65–99)

## 2017-11-25 LAB — CBC
HEMATOCRIT: 28.7 % — AB (ref 36.0–46.0)
HEMOGLOBIN: 9.1 g/dL — AB (ref 12.0–15.0)
MCH: 29.9 pg (ref 26.0–34.0)
MCHC: 31.7 g/dL (ref 30.0–36.0)
MCV: 94.4 fL (ref 78.0–100.0)
Platelets: 152 10*3/uL (ref 150–400)
RBC: 3.04 MIL/uL — AB (ref 3.87–5.11)
RDW: 13.9 % (ref 11.5–15.5)
WBC: 3.4 10*3/uL — AB (ref 4.0–10.5)

## 2017-11-25 LAB — BASIC METABOLIC PANEL WITH GFR
Anion gap: 11 (ref 5–15)
BUN: 52 mg/dL — ABNORMAL HIGH (ref 6–20)
CO2: 23 mmol/L (ref 22–32)
Calcium: 9.4 mg/dL (ref 8.9–10.3)
Chloride: 106 mmol/L (ref 101–111)
Creatinine, Ser: 3.33 mg/dL — ABNORMAL HIGH (ref 0.44–1.00)
GFR calc Af Amer: 14 mL/min — ABNORMAL LOW
GFR calc non Af Amer: 12 mL/min — ABNORMAL LOW
Glucose, Bld: 173 mg/dL — ABNORMAL HIGH (ref 65–99)
Potassium: 4.6 mmol/L (ref 3.5–5.1)
Sodium: 140 mmol/L (ref 135–145)

## 2017-11-25 LAB — HEPARIN LEVEL (UNFRACTIONATED): Heparin Unfractionated: 0.34 [IU]/mL (ref 0.30–0.70)

## 2017-11-25 LAB — MRSA PCR SCREENING: MRSA BY PCR: NEGATIVE

## 2017-11-25 MED ORDER — INSULIN NPH (HUMAN) (ISOPHANE) 100 UNIT/ML ~~LOC~~ SUSP
7.0000 [IU] | Freq: Two times a day (BID) | SUBCUTANEOUS | Status: DC
  Administered 2017-11-25 – 2017-11-27 (×5): 7 [IU] via SUBCUTANEOUS
  Filled 2017-11-25: qty 10

## 2017-11-25 MED ORDER — ISOSORBIDE MONONITRATE ER 60 MG PO TB24
120.0000 mg | ORAL_TABLET | Freq: Every day | ORAL | Status: DC
  Administered 2017-11-25 – 2017-11-27 (×3): 120 mg via ORAL
  Filled 2017-11-25 (×4): qty 2

## 2017-11-25 MED ORDER — LEVOTHYROXINE SODIUM 50 MCG PO TABS
50.0000 ug | ORAL_TABLET | Freq: Every day | ORAL | Status: DC
  Administered 2017-11-25 – 2017-11-27 (×3): 50 ug via ORAL
  Filled 2017-11-25 (×3): qty 1

## 2017-11-25 MED ORDER — POLYETHYLENE GLYCOL 3350 17 G PO PACK: 17.0000 g | PACK | Freq: Every day | ORAL | Status: DC | PRN

## 2017-11-25 MED ORDER — PANTOPRAZOLE SODIUM 40 MG PO TBEC
40.0000 mg | DELAYED_RELEASE_TABLET | Freq: Every day | ORAL | Status: DC
  Administered 2017-11-25 – 2017-11-27 (×3): 40 mg via ORAL
  Filled 2017-11-25 (×3): qty 1

## 2017-11-25 MED ORDER — CLONIDINE HCL 0.1 MG PO TABS
0.1000 mg | ORAL_TABLET | Freq: Two times a day (BID) | ORAL | Status: DC
  Administered 2017-11-25 – 2017-11-27 (×6): 0.1 mg via ORAL
  Filled 2017-11-25 (×6): qty 1

## 2017-11-25 MED ORDER — TORSEMIDE 20 MG PO TABS
20.0000 mg | ORAL_TABLET | Freq: Two times a day (BID) | ORAL | Status: DC
  Administered 2017-11-25 – 2017-11-27 (×6): 20 mg via ORAL
  Filled 2017-11-25 (×6): qty 1

## 2017-11-25 MED ORDER — HYDRALAZINE HCL 25 MG PO TABS
25.0000 mg | ORAL_TABLET | Freq: Two times a day (BID) | ORAL | Status: DC
  Administered 2017-11-25 (×3): 25 mg via ORAL
  Filled 2017-11-25 (×3): qty 1

## 2017-11-25 MED ORDER — CARVEDILOL 12.5 MG PO TABS
25.0000 mg | ORAL_TABLET | Freq: Two times a day (BID) | ORAL | Status: DC
  Administered 2017-11-25 (×2): 25 mg via ORAL
  Filled 2017-11-25 (×2): qty 2

## 2017-11-25 MED ORDER — ACETAMINOPHEN 325 MG PO TABS
650.0000 mg | ORAL_TABLET | Freq: Four times a day (QID) | ORAL | Status: DC | PRN
  Filled 2017-11-25: qty 2

## 2017-11-25 MED ORDER — CARVEDILOL 12.5 MG PO TABS
12.5000 mg | ORAL_TABLET | Freq: Two times a day (BID) | ORAL | Status: DC
  Administered 2017-11-25: 12.5 mg via ORAL
  Filled 2017-11-25: qty 1

## 2017-11-25 MED ORDER — INSULIN ASPART 100 UNIT/ML ~~LOC~~ SOLN
0.0000 [IU] | Freq: Three times a day (TID) | SUBCUTANEOUS | Status: DC
  Administered 2017-11-25: 2 [IU] via SUBCUTANEOUS
  Administered 2017-11-26: 3 [IU] via SUBCUTANEOUS

## 2017-11-25 MED ORDER — ONDANSETRON HCL 4 MG/2ML IJ SOLN
4.0000 mg | Freq: Four times a day (QID) | INTRAMUSCULAR | Status: DC | PRN
  Administered 2017-11-25: 4 mg via INTRAVENOUS
  Filled 2017-11-25: qty 2

## 2017-11-25 NOTE — Progress Notes (Signed)
ANTICOAGULATION CONSULT NOTE -f/u Consult  Pharmacy Consult for IV heparin Indication: chest pain/ACS  Allergies  Allergen Reactions  . Propoxyphene N-Acetaminophen     Pt does not know reaction  . Simvastatin Other (See Comments)    headache    Patient Measurements: Height: 5\' 5"  (165.1 cm) Weight: 132 lb 0.9 oz (59.9 kg) IBW/kg (Calculated) : 57 Heparin Dosing Weight: 61.2 kg  Vital Signs: Temp: 97.9 F (36.6 C) (12/29 0400) Temp Source: Axillary (12/29 0400) BP: 100/32 (12/29 0400) Pulse Rate: 57 (12/29 0400)  Labs: Recent Labs    11/24/17 1245 11/24/17 1254 11/24/17 1551 11/24/17 2039 11/24/17 2158 11/25/17 0330  HGB 9.9*  --   --   --   --  9.1*  HCT 32.1*  --   --   --   --  28.7*  PLT 172  --   --   --   --  152  APTT  --  34  --   --   --   --   LABPROT  --  13.4  --   --   --   --   INR  --  1.03  --   --   --   --   HEPARINUNFRC  --   --   --  0.51  --  0.34  CREATININE 3.40*  --   --   --   --   --   TROPONINI 0.11*  --  0.12*  --  0.14*  --     Estimated Creatinine Clearance: 11.3 mL/min (A) (by C-G formula based on SCr of 3.4 mg/dL (H)).   Medical History: Past Medical History:  Diagnosis Date  . CAD (coronary artery disease) 2003   Last catheterization 2008. Two-vessel PCI of the first OM branch and mid LAD.  Marland Kitchen CKD (chronic kidney disease)   . Congestive heart failure ([place]) 02/2017   grade 2 diastolic dysfunction  . CVA (cerebral vascular accident) ([place]) [place]   . Dementia   . DJD (degenerative joint disease) of lumbar spine   . Hypercholesteremia   . Hypertension   . IDDM (insulin dependent diabetes mellitus) (HCC)     Medications:  Scheduled:  . carvedilol  25 mg Oral BID WC  . cloNIDine  0.1 mg Oral BID  . hydrALAZINE  25 mg Oral BID  . insulin aspart  0-9 Units Subcutaneous TID WC  . insulin NPH Human  7 Units Subcutaneous BID AC & HS  . isosorbide mononitrate  120 mg Oral Daily  . levothyroxine  50 mcg Oral Daily   . pantoprazole  40 mg Oral Daily  . torsemide  20 mg Oral BID   Infusions:  . heparin 750 Units/hr (11/25/17 0400)  . niCARDipine 2.5 mg/hr (11/25/17 0428)    Assessment: 68 yoM c/o CP.  IV heparin for ACS. Trop 0.12>0.14  12/28 2039 HL=0.51 at goal 0330 HL=0.34 at goal x2, no infusion issues, some bleeding at skin tears per RN.   Goal of Therapy:  Heparin level 0.3-0.7 units/ml Monitor platelets by anticoagulation protocol: Yes   Plan:  Continue heparin drip at 750 units/hr Daily CBC/HL  Lawana Pai R 11/25/2017,5:00 AM

## 2017-11-25 NOTE — Progress Notes (Signed)
PROGRESS NOTE    [name]  ZOX:[id number] DOB: 1932/01/15 DOA: 11/24/2017 PCP: Timmothy Euler, MD     Brief Narrative:  [name] is a 83 yo female with past medical history significant for hypertension, pulmonary hypertension, paroxysmal atrial fibrillation, diabetes mellitus, diastolic heart failure, chronic kidney disease stage IV.  Patient was brought into the emergency department with chief complaint of chest pain and headache.  At the time of admission, her symptoms had resolved.  In the emergency department, patient's blood pressure was markedly elevated to 217/72.  Patient's daughter reports compliance with home blood pressure medications, although there have been multiple changes with her medication regimen recently.  Patient had been taken off of her Coreg, hydralazine was decreased to 25 mg 3 times daily due to dizziness.  Daughter reports that patient is currently taking hydralazine 10 mg tablets twice daily, carvedilol twice daily.  Patient's troponin was mildly elevated at 0.1, EDP spoke with [facility] cardiology Dr. Domenic Polite who felt troponin elevation was likely related to hypertensive urgency.  Patient was started on Cardene drip, heparin drip and admitted to [facility] long stepdown unit.  Assessment & Plan:   Principal Problem:   Hypertensive urgency Active Problems:   HTN (hypertension)   CAD S/P PCI '03 and '08   Pulmonary hypertension (HCC)   Insulin dependent diabetes mellitus with complications (HCC)   Chronic kidney disease, stage IV (severe) (HCC)   Pressure injury of skin   PAF (paroxysmal atrial fibrillation) (HCC)   Hypertensive urgency -Blood pressure 217/72 in the emergency department.  Was started on nitro drip, then Cardene drip. -Blood pressure this morning much improved 135/35.  Cardene drip stopped -Resume home blood pressure medications including Coreg, hydralazine, Imdur, clonidine  Chest pain with history of CAD -Troponin with flat  trend 0.11 --> 0.12 --> 0.14.  EDP spoke with [facility] cardiology Dr. Domenic Polite.  This likely represent troponin leak due to demand ischemia.  Patient denies chest pain today.  Patient had an echocardiogram 2 months ago so will not repeat.  Paroxysmal atrial fibrillation -CHADSVASC 6. Was deemed to be not a candidate for anticoagulation in the past by Cardiology  -Continue Coreg  Chronic diastolic heart failure -Stable without exacerbation -Continue home torsemide  Diabetes mellitus -Continue home NPH  Chronic kidney disease stage V -Baseline creatinine 4.3-4.4 in November 2018 -Stable    DVT prophylaxis: Subcu heparin Code Status: DNR Family Communication: Daughter at bedside Disposition Plan: PT OT evaluation, transfer to telemetry later today as long as patient's blood pressure remained stable off Cardene drip   Consultants:   None  Procedures:   None  Antimicrobials:  Anti-infectives (From admission, onward)   None        Subjective: Patient complains of back pain this morning, no chest pain, no headache. Breathing stable.   Objective: Vitals:   11/25/17 0600 11/25/17 0700 11/25/17 0735 11/25/17 0800  BP: (!) 143/49 (!) 141/35  (!) 135/35  Pulse: 66 60  60  Resp: 15 11  11   Temp:   97.6 F (36.4 C)   TempSrc:   Oral   SpO2: 98% 98%  97%  Weight:      Height:        Intake/Output Summary (Last 24 hours) at 11/25/2017 0904 Last data filed at 11/25/2017 0700 Gross per 24 hour  Intake 716.97 ml  Output 450 ml  Net 266.97 ml   Filed Weights   11/24/17 1156 11/25/17 0000  Weight: 61.2 kg (135 lb)  59.9 kg (132 lb 0.9 oz)    Examination:  General exam: Appears calm and comfortable  Respiratory system: Clear to auscultation. Respiratory effort normal. Cardiovascular system: S1 & S2 heard, RRR. No JVD, murmurs, rubs, gallops or clicks. No pedal edema. Gastrointestinal system: Abdomen is nondistended, soft and nontender. No organomegaly or masses  felt. Normal bowel sounds heard. Central nervous system: Alert. No focal neurological deficits. Extremities: Symmetric 5 x 5 power. Skin: No rashes, lesions or ulcers Psychiatry: Judgement and insight appear normal. Mood & affect appropriate.   Data Reviewed: I have personally reviewed following labs and imaging studies  CBC: Recent Labs  Lab 11/24/17 1128 11/24/17 1245 11/25/17 0330  WBC  --  3.7* 3.4*  NEUTROABS  --  2.3  --   HGB 11.5* 9.9* 9.1*  HCT  --  32.1* 28.7*  MCV  --  94.7 94.4  PLT  --  172 539   Basic Metabolic Panel: Recent Labs  Lab 11/24/17 1245 11/25/17 0330  NA 140 140  K 4.3 4.6  CL 104 106  CO2 24 23  GLUCOSE 146* 173*  BUN 53* 52*  CREATININE 3.40* 3.33*  CALCIUM 9.9 9.4   GFR: Estimated Creatinine Clearance: 11.5 mL/min (A) (by C-G formula based on SCr of 3.33 mg/dL (H)). Liver Function Tests: Recent Labs  Lab 11/24/17 1245  AST 10*  ALT 8*  ALKPHOS 50  BILITOT 0.6  PROT 6.8  ALBUMIN 3.8   No results for input(s): LIPASE, AMYLASE in the last 168 hours. No results for input(s): AMMONIA in the last 168 hours. Coagulation Profile: Recent Labs  Lab 11/24/17 1254  INR 1.03   Cardiac Enzymes: Recent Labs  Lab 11/24/17 1245 11/24/17 1551 11/24/17 2158  TROPONINI 0.11* 0.12* 0.14*   BNP (last 3 results) No results for input(s): PROBNP in the last 8760 hours. HbA1C: No results for input(s): HGBA1C in the last 72 hours. CBG: Recent Labs  Lab 11/24/17 1200 11/25/17 0039 11/25/17 0737  GLUCAP 140* 157* 155*   Lipid Profile: No results for input(s): CHOL, HDL, LDLCALC, TRIG, CHOLHDL, LDLDIRECT in the last 72 hours. Thyroid Function Tests: No results for input(s): TSH, T4TOTAL, FREET4, T3FREE, THYROIDAB in the last 72 hours. Anemia Panel: No results for input(s): VITAMINB12, FOLATE, FERRITIN, TIBC, IRON, RETICCTPCT in the last 72 hours. Sepsis Labs: No results for input(s): PROCALCITON, LATICACIDVEN in the last 168  hours.  Recent Results (from the past 240 hour(s))  Microscopic Examination     Status: None   Collection Time: 11/16/17 12:13 PM  Result Value Ref Range Status   WBC, UA 0-5 0 - 5 /hpf Final   RBC, UA None seen 0 - 2 /hpf Final   Epithelial Cells (non renal) 0-10 0 - 10 /hpf Final   Renal Epithel, UA None seen None seen /hpf Final   Mucus, UA Present Not Estab. Final   Bacteria, UA None seen None seen/Few Final   Yeast, UA Present None seen Final  Urine Culture     Status: None   Collection Time: 11/16/17 12:37 PM  Result Value Ref Range Status   Urine Culture, Routine Final report  Final   Organism ID, Bacteria Comment  Final    Comment: Mixed urogenital flora Less than 10,000 colonies/mL   MRSA PCR Screening     Status: None   Collection Time: 11/25/17 12:25 AM  Result Value Ref Range Status   MRSA by PCR NEGATIVE NEGATIVE Final    Comment:  The GeneXpert MRSA Assay (FDA approved for NASAL specimens only), is one component of a comprehensive MRSA colonization surveillance program. It is not intended to diagnose MRSA infection nor to guide or monitor treatment for MRSA infections.        Radiology Studies: Dg Chest 2 View  Result Date: 11/24/2017 CLINICAL DATA:  Mid chest pain for 1 day EXAM: CHEST  2 VIEW COMPARISON:  10/15/2017 FINDINGS: Cardiac shadow is stable. Tortuosity of the aorta with calcification is noted but stable. The lungs are well aerated bilaterally. Minimal scarring is noted in the bases. Previously seen effusions have resolved. No new focal abnormality is noted. IMPRESSION: No active cardiopulmonary disease. Electronically Signed   By: Inez Catalina M.D.   On: 11/24/2017 12:27   Ct Head Wo Contrast  Result Date: 11/24/2017 CLINICAL DATA:  83 year old female with headache. EXAM: CT HEAD WITHOUT CONTRAST TECHNIQUE: Contiguous axial images were obtained from the base of the skull through the vertex without intravenous contrast. COMPARISON:  Head  CT dated 10/15/2017 FINDINGS: Brain: There is mild age-related atrophy and chronic microvascular ischemic changes. No acute intracranial hemorrhage. No mass or midline shift. No extra-axial fluid collection. Vascular: No hyperdense vessel or unexpected calcification. Skull: Normal. Negative for fracture or focal lesion. Sinuses/Orbits: No acute finding. Other: None IMPRESSION: 1. No acute intracranial pathology. 2. Mild age-related atrophy and chronic microvascular ischemic changes. Electronically Signed   By: Anner Crete M.D.   On: 11/24/2017 23:47      Scheduled Meds: . carvedilol  25 mg Oral BID WC  . cloNIDine  0.1 mg Oral BID  . hydrALAZINE  25 mg Oral BID  . insulin aspart  0-9 Units Subcutaneous TID WC  . insulin NPH Human  7 Units Subcutaneous BID AC & HS  . isosorbide mononitrate  120 mg Oral Daily  . levothyroxine  50 mcg Oral Daily  . pantoprazole  40 mg Oral Daily  . torsemide  20 mg Oral BID   Continuous Infusions: . niCARDipine 2.5 mg/hr (11/25/17 0700)     LOS: 1 day    Time spent: 40 minutes   Dessa Phi, DO [facility] [email or web address] Password [email or web address] 11/25/2017, 9:04 AM

## 2017-11-25 NOTE — Progress Notes (Signed)
ANTICOAGULATION CONSULT NOTE -f/u Consult  Pharmacy Consult for IV heparin Indication: chest pain/ACS  Allergies  Allergen Reactions  . Propoxyphene N-Acetaminophen     Pt does not know reaction  . Simvastatin Other (See Comments)    headache    Patient Measurements: Height: 5\' 5"  (165.1 cm) Weight: 132 lb 0.9 oz (59.9 kg) IBW/kg (Calculated) : 57 Heparin Dosing Weight: 61.2 kg  Vital Signs: Temp: 98 F (36.7 C) (12/28 2248) Temp Source: Oral (12/28 2248) BP: 165/58 (12/28 2315) Pulse Rate: 82 (12/28 2248)  Labs: Recent Labs    11/24/17 1128 11/24/17 1245 11/24/17 1254 11/24/17 1551 11/24/17 2039 11/24/17 2158  HGB 11.5* 9.9*  --   --   --   --   HCT  --  32.1*  --   --   --   --   PLT  --  172  --   --   --   --   APTT  --   --  34  --   --   --   LABPROT  --   --  13.4  --   --   --   INR  --   --  1.03  --   --   --   HEPARINUNFRC  --   --   --   --  0.51  --   CREATININE  --  3.40*  --   --   --   --   TROPONINI  --  0.11*  --  0.12*  --  0.14*    Estimated Creatinine Clearance: 11.3 mL/min (A) (by C-G formula based on SCr of 3.4 mg/dL (H)).   Medical History: Past Medical History:  Diagnosis Date  . CAD (coronary artery disease) 2003   Last catheterization 2008. Two-vessel PCI of the first OM branch and mid LAD.  Marland Kitchen CKD (chronic kidney disease)   . Congestive heart failure ([place]) 02/2017   grade 2 diastolic dysfunction  . CVA (cerebral vascular accident) ([place]) [place]   . Dementia   . DJD (degenerative joint disease) of lumbar spine   . Hypercholesteremia   . Hypertension   . IDDM (insulin dependent diabetes mellitus) ([place])     Medications:  Scheduled:  . bacitracin      . carvedilol  25 mg Oral BID WC  . cloNIDine  0.1 mg Oral BID  . hydrALAZINE  25 mg Oral BID  . insulin aspart  0-9 Units Subcutaneous TID WC  . insulin NPH Human  7 Units Subcutaneous BID AC & HS  . isosorbide mononitrate  120 mg Oral Daily  . levothyroxine  50  mcg Oral Daily  . pantoprazole  40 mg Oral Daily  . torsemide  20 mg Oral BID   Infusions:  . heparin 750 Units/hr (11/24/17 1524)  . niCARDipine 7.5 mg/hr (11/24/17 2247)    Assessment: 42 yoM c/o CP.  IV heparin for ACS. Trop 0.12>0.14  12/28 2039 HL=0.51 at goal  Goal of Therapy:  Heparin level 0.3-0.7 units/ml Monitor platelets by anticoagulation protocol: Yes   Plan:  Continue heparin drip at 750 units/hr Daily CBC/HL  Lawana Pai R 11/25/2017,12:33 AM

## 2017-11-25 NOTE — Progress Notes (Signed)
Assumed care of patient at 1800. Agree with previous RN assessment. Will continue to monitor closely

## 2017-11-26 LAB — GLUCOSE, CAPILLARY
GLUCOSE-CAPILLARY: 205 mg/dL — AB (ref 65–99)
GLUCOSE-CAPILLARY: 105 mg/dL — AB (ref 65–99)
GLUCOSE-CAPILLARY: 105 mg/dL — AB (ref 65–99)
Glucose-Capillary: 84 mg/dL (ref 65–99)

## 2017-11-26 LAB — BASIC METABOLIC PANEL
Anion gap: 7 (ref 5–15)
BUN: 56 mg/dL — ABNORMAL HIGH (ref 6–20)
CALCIUM: 9.3 mg/dL (ref 8.9–10.3)
CO2: 24 mmol/L (ref 22–32)
CREATININE: 3.59 mg/dL — AB (ref 0.44–1.00)
Chloride: 108 mmol/L (ref 101–111)
GFR, EST AFRICAN AMERICAN: 13 mL/min — AB (ref >60)
GFR, EST NON AFRICAN AMERICAN: 11 mL/min — AB (ref >60)
Glucose, Bld: 107 mg/dL — ABNORMAL HIGH (ref 65–99)
Potassium: 4.6 mmol/L (ref 3.5–5.1)
SODIUM: 139 mmol/L (ref 135–145)

## 2017-11-26 LAB — CBC
HEMATOCRIT: 25.7 % — AB (ref 36.0–46.0)
Hemoglobin: 8.1 g/dL — ABNORMAL LOW (ref 12.0–15.0)
MCH: 29.7 pg (ref 26.0–34.0)
MCHC: 31.5 g/dL (ref 30.0–36.0)
MCV: 94.1 fL (ref 78.0–100.0)
PLATELETS: 139 10*3/uL — AB (ref 150–400)
RBC: 2.73 MIL/uL — ABNORMAL LOW (ref 3.87–5.11)
RDW: 13.7 % (ref 11.5–15.5)
WBC: 2.8 10*3/uL — ABNORMAL LOW (ref 4.0–10.5)

## 2017-11-26 MED ORDER — HYDRALAZINE HCL 20 MG/ML IJ SOLN: 5.0000 mg | INTRAMUSCULAR | Status: DC | PRN

## 2017-11-26 MED ORDER — HYDRALAZINE HCL 50 MG PO TABS
50.0000 mg | ORAL_TABLET | Freq: Two times a day (BID) | ORAL | Status: DC
  Administered 2017-11-26 – 2017-11-27 (×3): 50 mg via ORAL
  Filled 2017-11-26 (×3): qty 1

## 2017-11-26 MED ORDER — CARVEDILOL 6.25 MG PO TABS
6.2500 mg | ORAL_TABLET | Freq: Two times a day (BID) | ORAL | Status: DC
Start: 2017-11-26 — End: 2017-11-27
  Administered 2017-11-26 – 2017-11-27 (×3): 6.25 mg via ORAL
  Filled 2017-11-26 (×3): qty 1

## 2017-11-26 NOTE — Progress Notes (Signed)
PROGRESS NOTE    [name]  JAS:[id number] DOB: 01/28/1932 DOA: 11/24/2017 PCP: Timmothy Euler, MD     Brief Narrative:  [name] is a 83 yo female with past medical history significant for hypertension, pulmonary hypertension, paroxysmal atrial fibrillation, diabetes mellitus, diastolic heart failure, chronic kidney disease stage IV.  Patient was brought into the emergency department with chief complaint of chest pain and headache.  At the time of admission, her symptoms had resolved.  In the emergency department, patient's blood pressure was markedly elevated to 217/72.  Patient's daughter reports compliance with home blood pressure medications, although there have been multiple changes with her medication regimen recently.  Patient had been taken off of her Coreg, hydralazine was decreased to 25 mg 3 times daily due to dizziness.  Daughter reports that patient is currently taking hydralazine 10 mg tablets twice daily, carvedilol twice daily.  Patient's troponin was mildly elevated at 0.1, EDP spoke with [facility] cardiology Dr. Domenic Polite who felt troponin elevation was likely related to hypertensive urgency.  Patient was started on Cardene drip, heparin drip and admitted to [facility] long stepdown unit.  Assessment & Plan:   Principal Problem:   Hypertensive urgency Active Problems:   HTN (hypertension)   CAD S/P PCI '03 and '08   Pulmonary hypertension (HCC)   Insulin dependent diabetes mellitus with complications (HCC)   Chronic kidney disease, stage IV (severe) (HCC)   Pressure injury of skin   PAF (paroxysmal atrial fibrillation) (HCC)   Hypertensive urgency -Blood pressure 217/72 in the emergency department.  Was started on nitro drip, then Cardene drip. Blood pressure improved and cardene drip stopped -Resume home blood pressure medications including Coreg, hydralazine, Imdur, clonidine  Chest pain with history of CAD -Troponin with flat trend 0.11 --> 0.12 -->  0.14.  EDP spoke with [facility] cardiology Dr. Domenic Polite.  This likely represent troponin leak due to demand ischemia.  Patient denies chest pain today.  Patient had an echocardiogram 2 months ago so will not repeat.  Paroxysmal atrial fibrillation -CHADSVASC 6. Was deemed to be not a candidate for anticoagulation in the past by Cardiology  -Continue Coreg  Chronic diastolic heart failure -Stable without exacerbation -Continue home torsemide  Diabetes mellitus -Continue home NPH  Chronic kidney disease stage V -Baseline creatinine 4.3-4.4 in November 2018 -Stable    DVT prophylaxis: Subcu heparin Code Status: DNR Family Communication: Daughter over the phone  Disposition Plan: PT OT evaluation. Discharge next 24-48 hours depending on blood pressure stabilization and further med dosing.    Consultants:   None  Procedures:   None  Antimicrobials:  Anti-infectives (From admission, onward)   None       Subjective: No chest pain. Had mild headache that has now resolved.   Objective: Vitals:   11/25/17 1600 11/25/17 1839 11/25/17 2020 11/26/17 0421  BP: (!) 126/38 (!) 123/48 (!) 176/55 (!) 165/59  Pulse: (!) 58 (!) 59 61 (!) 59  Resp: 11 16 18 20   Temp:  97.8 F (36.6 C) 97.6 F (36.4 C) 97.7 F (36.5 C)  TempSrc:  Axillary Oral Oral  SpO2: 97% 100% 100% 99%  Weight:  60.9 kg (134 lb 4.2 oz)    Height:  5\' 5"  (1.651 m)      Intake/Output Summary (Last 24 hours) at 11/26/2017 1244 Last data filed at 11/25/2017 1700 Gross per 24 hour  Intake -  Output 175 ml  Net -175 ml   Filed Weights   11/24/17 1156  11/25/17 0000 11/25/17 1839  Weight: 61.2 kg (135 lb) 59.9 kg (132 lb 0.9 oz) 60.9 kg (134 lb 4.2 oz)    Examination:  General exam: Appears calm and comfortable  Respiratory system: Clear to auscultation. Respiratory effort normal. Cardiovascular system: S1 & S2 heard, RRR. No JVD, murmurs, rubs, gallops or clicks. No pedal edema. Gastrointestinal  system: Abdomen is nondistended, soft and nontender. No organomegaly or masses felt. Normal bowel sounds heard. Central nervous system: Alert. No focal neurological deficits. Extremities: Symmetric 5 x 5 power. Skin: No rashes, lesions or ulcers Psychiatry: Judgement and insight appear normal. Mood & affect appropriate.   Data Reviewed: I have personally reviewed following labs and imaging studies  CBC: Recent Labs  Lab 11/24/17 1128 11/24/17 1245 11/25/17 0330 11/26/17 0443  WBC  --  3.7* 3.4* 2.8*  NEUTROABS  --  2.3  --   --   HGB 11.5* 9.9* 9.1* 8.1*  HCT  --  32.1* 28.7* 25.7*  MCV  --  94.7 94.4 94.1  PLT  --  172 152 591*   Basic Metabolic Panel: Recent Labs  Lab 11/24/17 1245 11/25/17 0330 11/26/17 0443  NA 140 140 139  K 4.3 4.6 4.6  CL 104 106 108  CO2 24 23 24   GLUCOSE 146* 173* 107*  BUN 53* 52* 56*  CREATININE 3.40* 3.33* 3.59*  CALCIUM 9.9 9.4 9.3   GFR: Estimated Creatinine Clearance: 10.7 mL/min (A) (by C-G formula based on SCr of 3.59 mg/dL (H)). Liver Function Tests: Recent Labs  Lab 11/24/17 1245  AST 10*  ALT 8*  ALKPHOS 50  BILITOT 0.6  PROT 6.8  ALBUMIN 3.8   No results for input(s): LIPASE, AMYLASE in the last 168 hours. No results for input(s): AMMONIA in the last 168 hours. Coagulation Profile: Recent Labs  Lab 11/24/17 1254  INR 1.03   Cardiac Enzymes: Recent Labs  Lab 11/24/17 1245 11/24/17 1551 11/24/17 2158  TROPONINI 0.11* 0.12* 0.14*   BNP (last 3 results) No results for input(s): PROBNP in the last 8760 hours. HbA1C: No results for input(s): HGBA1C in the last 72 hours. CBG: Recent Labs  Lab 11/25/17 1256 11/25/17 1643 11/25/17 2107 11/26/17 0740 11/26/17 1156  GLUCAP 125* 102* 148* 84 205*   Lipid Profile: No results for input(s): CHOL, HDL, LDLCALC, TRIG, CHOLHDL, LDLDIRECT in the last 72 hours. Thyroid Function Tests: No results for input(s): TSH, T4TOTAL, FREET4, T3FREE, THYROIDAB in the last 72  hours. Anemia Panel: No results for input(s): VITAMINB12, FOLATE, FERRITIN, TIBC, IRON, RETICCTPCT in the last 72 hours. Sepsis Labs: No results for input(s): PROCALCITON, LATICACIDVEN in the last 168 hours.  Recent Results (from the past 240 hour(s))  MRSA PCR Screening     Status: None   Collection Time: 11/25/17 12:25 AM  Result Value Ref Range Status   MRSA by PCR NEGATIVE NEGATIVE Final    Comment:        The GeneXpert MRSA Assay (FDA approved for NASAL specimens only), is one component of a comprehensive MRSA colonization surveillance program. It is not intended to diagnose MRSA infection nor to guide or monitor treatment for MRSA infections.        Radiology Studies: Ct Head Wo Contrast  Result Date: 11/24/2017 CLINICAL DATA:  83 year old female with headache. EXAM: CT HEAD WITHOUT CONTRAST TECHNIQUE: Contiguous axial images were obtained from the base of the skull through the vertex without intravenous contrast. COMPARISON:  Head CT dated 10/15/2017 FINDINGS: Brain: There is mild age-related  atrophy and chronic microvascular ischemic changes. No acute intracranial hemorrhage. No mass or midline shift. No extra-axial fluid collection. Vascular: No hyperdense vessel or unexpected calcification. Skull: Normal. Negative for fracture or focal lesion. Sinuses/Orbits: No acute finding. Other: None IMPRESSION: 1. No acute intracranial pathology. 2. Mild age-related atrophy and chronic microvascular ischemic changes. Electronically Signed   By: Anner Crete M.D.   On: 11/24/2017 23:47      Scheduled Meds: . carvedilol  6.25 mg Oral BID WC  . cloNIDine  0.1 mg Oral BID  . hydrALAZINE  50 mg Oral BID  . insulin aspart  0-9 Units Subcutaneous TID WC  . insulin NPH Human  7 Units Subcutaneous BID AC & HS  . isosorbide mononitrate  120 mg Oral Daily  . levothyroxine  50 mcg Oral Daily  . pantoprazole  40 mg Oral Daily  . torsemide  20 mg Oral BID   Continuous  Infusions:    LOS: 2 days    Time spent: 30 minutes   Dessa Phi, DO [facility] [email or web address] Password [email or web address] 11/26/2017, 12:44 PM

## 2017-11-27 DIAGNOSIS — Z794 Long term (current) use of insulin: Secondary | ICD-10-CM

## 2017-11-27 DIAGNOSIS — E118 Type 2 diabetes mellitus with unspecified complications: Secondary | ICD-10-CM

## 2017-11-27 DIAGNOSIS — I2510 Atherosclerotic heart disease of native coronary artery without angina pectoris: Secondary | ICD-10-CM

## 2017-11-27 DIAGNOSIS — Z9861 Coronary angioplasty status: Secondary | ICD-10-CM

## 2017-11-27 LAB — BASIC METABOLIC PANEL
ANION GAP: 5 (ref 5–15)
BUN: 62 mg/dL — AB (ref 6–20)
CHLORIDE: 106 mmol/L (ref 101–111)
CO2: 27 mmol/L (ref 22–32)
Calcium: 9.3 mg/dL (ref 8.9–10.3)
Creatinine, Ser: 4.14 mg/dL — ABNORMAL HIGH (ref 0.44–1.00)
GFR calc Af Amer: 11 mL/min — ABNORMAL LOW (ref >60)
GFR, EST NON AFRICAN AMERICAN: 9 mL/min — AB (ref >60)
Glucose, Bld: 145 mg/dL — ABNORMAL HIGH (ref 65–99)
POTASSIUM: 4.8 mmol/L (ref 3.5–5.1)
SODIUM: 138 mmol/L (ref 135–145)

## 2017-11-27 LAB — CBC
HEMATOCRIT: 24.1 % — AB (ref 36.0–46.0)
HEMOGLOBIN: 7.8 g/dL — AB (ref 12.0–15.0)
MCH: 30.5 pg (ref 26.0–34.0)
MCHC: 32.4 g/dL (ref 30.0–36.0)
MCV: 94.1 fL (ref 78.0–100.0)
Platelets: 130 10*3/uL — ABNORMAL LOW (ref 150–400)
RBC: 2.56 MIL/uL — ABNORMAL LOW (ref 3.87–5.11)
RDW: 13.9 % (ref 11.5–15.5)
WBC: 2.6 10*3/uL — AB (ref 4.0–10.5)

## 2017-11-27 LAB — GLUCOSE, CAPILLARY
GLUCOSE-CAPILLARY: 106 mg/dL — AB (ref 65–99)
Glucose-Capillary: 102 mg/dL — ABNORMAL HIGH (ref 65–99)

## 2017-11-27 MED ORDER — HYDRALAZINE HCL 50 MG PO TABS: 50.0000 mg | ORAL_TABLET | Freq: Two times a day (BID) | ORAL | 0 refills | Status: DC

## 2017-11-27 MED ORDER — CARVEDILOL 6.25 MG PO TABS
6.2500 mg | ORAL_TABLET | Freq: Two times a day (BID) | ORAL | 0 refills | Status: DC
Start: 2017-11-27 — End: 2017-12-25

## 2017-11-27 MED ORDER — ISOSORBIDE MONONITRATE ER 120 MG PO TB24: 120.0000 mg | ORAL_TABLET | Freq: Every day | ORAL | 0 refills | Status: AC

## 2017-11-27 NOTE — Discharge Summary (Signed)
Physician Discharge Summary  [name] WCB:[id number] DOB: 07/16/1932 DOA: 11/24/2017  PCP: Timmothy Euler, MD  Admit date: 11/24/2017 Discharge date: 11/27/2017  Admitted From: Home Disposition:  Home  Recommendations for Outpatient Follow-up:  1. Follow up with PCP in 1-2 weeks 2. Please obtain BMP/CBC in one week  Home Health:No Equipment/Devices:None  Discharge Condition:stable CODE STATUS:full Diet recommendation: Heart Healthy   Brief/Interim Summary: 83 yo female with past medical history significant for hypertension, pulmonary hypertension, paroxysmal atrial fibrillation, diabetes mellitus, diastolic heart failure, chronic kidney disease stage IV.  Patient was brought into the emergency department with chief complaint of chest pain and headache.   In the emergency department, patient's blood pressure was markedly elevated to 217/72.  Patient's daughter reports compliance with home blood pressure medications, although there have been multiple changes with her medication regimen recently.  P  Patient's troponin was mildly elevated at 0.1, EDP spoke with [facility] cardiology Dr. Domenic Polite who felt troponin elevation was likely related to hypertensive urgency.  Patient was started on Cardene drip, heparin drip and admitted to [facility] long stepdown unit.    Discharge Diagnoses:  Principal Problem:   Hypertensive urgency Active Problems:   HTN (hypertension)   CAD S/P PCI '03 and '08   Pulmonary hypertension (HCC)   Insulin dependent diabetes mellitus with complications (HCC)   Chronic kidney disease, stage IV (severe) (HCC)   Pressure injury of skin   PAF (paroxysmal atrial fibrillation) ([place])  Hypertensive urgency: On admission her blood pressure was 217/72 she was started on a IV blood pressure medication her blood pressure home regimen was resumed and her blood pressure improved her hydralazine was increased.  Her blood pressure has been ranging from 122/31  -175/55 after resuming her antihypertensive medication. I am concerned about compliance although her daughter admits that she has been taking it regularly.  She will need to follow-up with her primary care doctor in 2 weeks check a basic metabolic panel then.  Chest pain without a history of coronary artery disease: The ED spoke with the cardiologist who seems to think that the elevation in cardiac biomarkers is due to demand ischemia.  Paroxysmal atrial fibrillation: Continue Coreg heart rate controlled CHADSVASC 6. Was deemed to be not a candidate for anticoagulation in the past   Chronic diastolic heart failure: Seems to be euvolemic no changes made to her medication.  Diabetes mellitus type 2: No changes made to her medication.  Chronic kidney disease stage V: Creatinine at baseline.  Discharge Instructions  Discharge Instructions    Diet - low sodium heart healthy   Complete by:  As directed    Increase activity slowly   Complete by:  As directed      Allergies as of 11/27/2017      Reactions   Propoxyphene N-acetaminophen    Pt does not know reaction   Simvastatin Other (See Comments)   headache      Medication List    TAKE these medications   aspirin 81 MG EC tablet Take 1 tablet (81 mg total) by mouth daily.   carvedilol 6.25 MG tablet Commonly known as:  COREG Take 1 tablet (6.25 mg total) by mouth 2 (two) times daily with a meal. What changed:    medication strength  how much to take   cholecalciferol 1000 units tablet Commonly known as:  VITAMIN D Take 1,000 Units by mouth daily.   cloNIDine 0.1 MG tablet Commonly known as:  CATAPRES Take 1 tablet by mouth  2 (two) times daily.   diclofenac sodium 1 % Gel Commonly known as:  VOLTAREN Apply 4 g topically 4 (four) times daily. What changed:    when to take this  reasons to take this   hydrALAZINE 50 MG tablet Commonly known as:  APRESOLINE Take 1 tablet (50 mg total) by mouth 2 (two) times  daily. What changed:    medication strength  how much to take   HYDROcodone-acetaminophen 5-325 MG tablet Commonly known as:  NORCO Take 0.5-1 tablets every 6 (six) hours as needed by mouth for moderate pain.   insulin NPH Human 100 UNIT/ML injection Commonly known as:  HUMULIN N Inject 0.1 mLs (10 Units total) into the skin 2 (two) times daily before a meal. What changed:  additional instructions   isosorbide mononitrate 120 MG 24 hr tablet Commonly known as:  IMDUR Take 1 tablet (120 mg total) by mouth daily.   levothyroxine 50 MCG tablet Commonly known as:  SYNTHROID, LEVOTHROID TAKE 1 TABLET DAILY   nitroGLYCERIN 0.4 MG SL tablet Commonly known as:  NITROSTAT PLACE 1 TABLET UNDER THE TONGUE AT ONSET OF CHEST PAIN EVERY 5 MINTUES UP TO 3 TIMES AS NEEDED   pantoprazole 40 MG tablet Commonly known as:  PROTONIX Take 1 tablet (40 mg total) by mouth 2 (two) times daily. X one month, then once daily What changed:    when to take this  additional instructions   polyethylene glycol powder powder Commonly known as:  GLYCOLAX/MIRALAX MIX 1 CAPFUL (17 GRAMS) WITH 8OZ OF WATER OR JUICE DAILY. What changed:  See the new instructions.   rosuvastatin 10 MG tablet Commonly known as:  CRESTOR TAKE 1 TABLET DAILY   sodium polystyrene 15 GM/60ML suspension Commonly known as:  KAYEXALATE Take 60 mLs (15 g total) by mouth daily. What changed:    when to take this  reasons to take this   sucralfate 1 GM/10ML suspension Commonly known as:  CARAFATE Take 10 mLs (1 g total) by mouth every 6 (six) hours. What changed:    when to take this  reasons to take this   torsemide 20 MG tablet Commonly known as:  DEMADEX Take 1 tablet (20 mg total) by mouth 2 (two) times daily.       Allergies  Allergen Reactions  . Propoxyphene N-Acetaminophen     Pt does not know reaction  . Simvastatin Other (See Comments)    headache     Consultations:  None   Procedures/Studies: Dg Chest 2 View  Result Date: 11/24/2017 CLINICAL DATA:  Mid chest pain for 1 day EXAM: CHEST  2 VIEW COMPARISON:  10/15/2017 FINDINGS: Cardiac shadow is stable. Tortuosity of the aorta with calcification is noted but stable. The lungs are well aerated bilaterally. Minimal scarring is noted in the bases. Previously seen effusions have resolved. No new focal abnormality is noted. IMPRESSION: No active cardiopulmonary disease. Electronically Signed   By: Inez Catalina M.D.   On: 11/24/2017 12:27   Ct Head Wo Contrast  Result Date: 11/24/2017 CLINICAL DATA:  83 year old female with headache. EXAM: CT HEAD WITHOUT CONTRAST TECHNIQUE: Contiguous axial images were obtained from the base of the skull through the vertex without intravenous contrast. COMPARISON:  Head CT dated 10/15/2017 FINDINGS: Brain: There is mild age-related atrophy and chronic microvascular ischemic changes. No acute intracranial hemorrhage. No mass or midline shift. No extra-axial fluid collection. Vascular: No hyperdense vessel or unexpected calcification. Skull: Normal. Negative for fracture or focal lesion. Sinuses/Orbits: No  acute finding. Other: None IMPRESSION: 1. No acute intracranial pathology. 2. Mild age-related atrophy and chronic microvascular ischemic changes. Electronically Signed   By: Anner Crete M.D.   On: 11/24/2017 23:47     Subjective: No complaints.  Discharge Exam: Vitals:   11/27/17 0428 11/27/17 0821  BP: (!) 143/47 (!) 187/46  Pulse: (!) 59 63  Resp: 18   Temp: 98.7 F (37.1 C)   SpO2: 97%    Vitals:   11/26/17 1538 11/26/17 2015 11/27/17 0428 11/27/17 0821  BP: (!) 142/50 (!) 151/51 (!) 143/47 (!) 187/46  Pulse: (!) 59 61 (!) 59 63  Resp: 19 20 18    Temp: 98 F (36.7 C) 98.1 F (36.7 C) 98.7 F (37.1 C)   TempSrc: Oral Oral Oral   SpO2: 99% 99% 97%   Weight:      Height:        General: Pt is alert, awake, not in acute  distress Cardiovascular: RRR, S1/S2 +, no rubs, no gallops Respiratory: CTA bilaterally, no wheezing, no rhonchi Abdominal: Soft, NT, ND, bowel sounds + Extremities: no edema, no cyanosis    The results of significant diagnostics from this hospitalization (including imaging, microbiology, ancillary and laboratory) are listed below for reference.     Microbiology: Recent Results (from the past 240 hour(s))  MRSA PCR Screening     Status: None   Collection Time: 11/25/17 12:25 AM  Result Value Ref Range Status   MRSA by PCR NEGATIVE NEGATIVE Final    Comment:        The GeneXpert MRSA Assay (FDA approved for NASAL specimens only), is one component of a comprehensive MRSA colonization surveillance program. It is not intended to diagnose MRSA infection nor to guide or monitor treatment for MRSA infections.      Labs: BNP (last 3 results) Recent Labs    09/20/17 1940  BNP 2,993.7*   Basic Metabolic Panel: Recent Labs  Lab 11/24/17 1245 11/25/17 0330 11/26/17 0443 11/27/17 0441  NA 140 140 139 138  K 4.3 4.6 4.6 4.8  CL 104 106 108 106  CO2 24 23 24 27   GLUCOSE 146* 173* 107* 145*  BUN 53* 52* 56* 62*  CREATININE 3.40* 3.33* 3.59* 4.14*  CALCIUM 9.9 9.4 9.3 9.3   Liver Function Tests: Recent Labs  Lab 11/24/17 1245  AST 10*  ALT 8*  ALKPHOS 50  BILITOT 0.6  PROT 6.8  ALBUMIN 3.8   No results for input(s): LIPASE, AMYLASE in the last 168 hours. No results for input(s): AMMONIA in the last 168 hours. CBC: Recent Labs  Lab 11/24/17 1128 11/24/17 1245 11/25/17 0330 11/26/17 0443 11/27/17 0441  WBC  --  3.7* 3.4* 2.8* 2.6*  NEUTROABS  --  2.3  --   --   --   HGB 11.5* 9.9* 9.1* 8.1* 7.8*  HCT  --  32.1* 28.7* 25.7* 24.1*  MCV  --  94.7 94.4 94.1 94.1  PLT  --  172 152 139* 130*   Cardiac Enzymes: Recent Labs  Lab 11/24/17 1245 11/24/17 1551 11/24/17 2158  TROPONINI 0.11* 0.12* 0.14*   BNP: Invalid input(s): POCBNP CBG: Recent Labs  Lab  11/26/17 0740 11/26/17 1156 11/26/17 1618 11/26/17 2034 11/27/17 0735  GLUCAP 84 205* 105* 105* 102*   D-Dimer No results for input(s): DDIMER in the last 72 hours. Hgb A1c No results for input(s): HGBA1C in the last 72 hours. Lipid Profile No results for input(s): CHOL, HDL, LDLCALC, TRIG, CHOLHDL, LDLDIRECT  in the last 72 hours. Thyroid function studies No results for input(s): TSH, T4TOTAL, T3FREE, THYROIDAB in the last 72 hours.  Invalid input(s): FREET3 Anemia work up No results for input(s): VITAMINB12, FOLATE, FERRITIN, TIBC, IRON, RETICCTPCT in the last 72 hours. Urinalysis    Component Value Date/Time   COLORURINE YELLOW 10/15/2017 0910   APPEARANCEUR Clear 11/16/2017 1213   LABSPEC 1.012 10/15/2017 0910   PHURINE 5.0 10/15/2017 0910   GLUCOSEU Trace (A) 11/16/2017 1213   HGBUR NEGATIVE 10/15/2017 0910   BILIRUBINUR Negative 11/16/2017 1213   KETONESUR NEGATIVE 10/15/2017 0910   PROTEINUR 3+ (A) 11/16/2017 1213   PROTEINUR 100 (A) 10/15/2017 0910   UROBILINOGEN negative 07/02/2015 1505   UROBILINOGEN 0.2 05/10/2015 1245   NITRITE Negative 11/16/2017 1213   NITRITE NEGATIVE 10/15/2017 0910   LEUKOCYTESUR Trace (A) 11/16/2017 1213   Sepsis Labs Invalid input(s): PROCALCITONIN,  WBC,  LACTICIDVEN Microbiology Recent Results (from the past 240 hour(s))  MRSA PCR Screening     Status: None   Collection Time: 11/25/17 12:25 AM  Result Value Ref Range Status   MRSA by PCR NEGATIVE NEGATIVE Final    Comment:        The GeneXpert MRSA Assay (FDA approved for NASAL specimens only), is one component of a comprehensive MRSA colonization surveillance program. It is not intended to diagnose MRSA infection nor to guide or monitor treatment for MRSA infections.      Time coordinating discharge: Over 30 minutes  SIGNED:   Charlynne Cousins, MD  [facility] 11/27/2017, 10:32 AM Pager   If 7PM-7AM, please contact  night-coverage [email or web address] Password [email or web address]

## 2017-11-27 NOTE — Progress Notes (Signed)
PT DISCHARGED TO HOME. INSTRUCTIONS COMPLETED WITH PATIENT. IV DISCONTINUED DAUGHTER AND PATIENT VERBALIZES UNDERSTANDING.

## 2017-12-01 ENCOUNTER — Encounter: Payer: Self-pay | Admitting: Family Medicine

## 2017-12-01 ENCOUNTER — Ambulatory Visit (INDEPENDENT_AMBULATORY_CARE_PROVIDER_SITE_OTHER): Payer: Medicare Other | Admitting: Family Medicine

## 2017-12-01 VITALS — BP 195/69 | HR 70 | Temp 97.7°F | Ht 65.0 in | Wt 133.8 lb

## 2017-12-01 DIAGNOSIS — I2510 Atherosclerotic heart disease of native coronary artery without angina pectoris: Secondary | ICD-10-CM

## 2017-12-01 DIAGNOSIS — I10 Essential (primary) hypertension: Secondary | ICD-10-CM

## 2017-12-01 DIAGNOSIS — J181 Lobar pneumonia, unspecified organism: Secondary | ICD-10-CM

## 2017-12-01 DIAGNOSIS — J189 Pneumonia, unspecified organism: Secondary | ICD-10-CM

## 2017-12-01 MED ORDER — CLONIDINE 0.3 MG/24HR TD PTWK: 0.3000 mg | MEDICATED_PATCH | TRANSDERMAL | 12 refills | Status: DC

## 2017-12-01 MED ORDER — LEVOFLOXACIN 250 MG PO TABS: 250.0000 mg | ORAL_TABLET | ORAL | 0 refills | Status: DC

## 2017-12-01 NOTE — Progress Notes (Signed)
   HPI  Patient presents today   For shortness of breath, chest pain, elevated blood pressure, for ER follow-up.  Patient was seen in the emergency room a few days ago for elevated blood pressure headache and chest pain. Her medications were adjusted, CT of the head was clear, and she was discharged home. She has not taken her medications yet today but states that for the last 2 days she has had cough and chest pain.  She states the chest pain gets better when she rests, it has improved with 1 dose of nitroglycerin.  Is adamant that she does not want to go to the emergency room or hospital.  She states that she is comfortable with dying if this process leads to a natural death.  He reports cough, mild shortness of breath. She states that she has not taken her medications today because she was going to take them before breakfast when she goes out after this.   PMH: Smoking status noted ROS: Per HPI  Objective: BP (!) 195/69   Pulse 70   Temp 97.7 F (36.5 C) (Oral)   Ht 5\' 5"  (1.651 m)   Wt 133 lb 12.8 oz (60.7 kg)   SpO2 99%   BMI 22.27 kg/m  Gen: NAD, alert, cooperative with exam HEENT: NCAT CV: RRR, good S1/S2, no murmur Resp: Unlabored, persistent rhonchi in the left upper lung field with good air movement Ext: No edema, warm Neuro: Alert and oriented, No gross deficits  Assessment and plan:  #CAP Cough and dyspnea are likely multifactorial with CAD, CKD. However her lung exam Is concerning for CAP with peristent added sounds in the LU lung field.  Tx with renally dosed levaquin, CrCl 10  # HTN Hypertensive emergency today with CP and HA, however she refuses to seek emergency medical care. She states that she is comfortable with a natural death if this process leads to it.  She denies SI Change BID clonidine to 0.3 mg clonidine patch PT has not taken meds today, encouraged her to take them ASAP.   #CAD Patient has known CAD, she did have improvement in chest pain  with nitroglycerin last night, I am confident that her chest pain is at least partially caused by heart disease.  It could be multifactorial with concern for pneumonia today. I recommended emergency room evaluation which she refuses today. Treating pneumonia, encouraged her to take her medications more consistently.    Meds ordered this encounter  Medications  . levofloxacin (LEVAQUIN) 250 MG tablet    Sig: Take 1 tablet (250 mg total) by mouth every other day.    Dispense:  4 tablet    Refill:  0  . cloNIDine (CATAPRES - DOSED IN MG/24 HR) 0.3 mg/24hr patch    Sig: Place 1 patch (0.3 mg total) onto the skin once a week.    Dispense:  4 patch    Refill:  [place], MD [facility] 12/01/2017, 10:52 AM

## 2017-12-01 NOTE — Patient Instructions (Signed)
Great to see you!  Start levaquin 1 pill every other day.  Stop clonidine, start the clonidine patch

## 2017-12-12 ENCOUNTER — Other Ambulatory Visit: Payer: Self-pay | Admitting: Family Medicine

## 2017-12-13 ENCOUNTER — Other Ambulatory Visit: Payer: Self-pay | Admitting: *Deleted

## 2017-12-22 ENCOUNTER — Emergency Department (HOSPITAL_COMMUNITY): Payer: Medicare Other

## 2017-12-22 ENCOUNTER — Encounter (HOSPITAL_COMMUNITY): Payer: Self-pay

## 2017-12-22 ENCOUNTER — Inpatient Hospital Stay (HOSPITAL_COMMUNITY)
Admission: EM | Admit: 2017-12-22 | Discharge: 2017-12-25 | DRG: 281 | Disposition: A | Discharge status: Home Health | Payer: Medicare Other | Attending: Internal Medicine | Admitting: Internal Medicine

## 2017-12-22 DIAGNOSIS — E7800 Pure hypercholesterolemia, unspecified: Secondary | ICD-10-CM | POA: Diagnosis not present

## 2017-12-22 DIAGNOSIS — E118 Type 2 diabetes mellitus with unspecified complications: Secondary | ICD-10-CM | POA: Diagnosis not present

## 2017-12-22 DIAGNOSIS — Z9119 Patient's noncompliance with other medical treatment and regimen: Secondary | ICD-10-CM | POA: Diagnosis not present

## 2017-12-22 DIAGNOSIS — Z9861 Coronary angioplasty status: Secondary | ICD-10-CM | POA: Diagnosis not present

## 2017-12-22 DIAGNOSIS — I214 Non-ST elevation (NSTEMI) myocardial infarction: Secondary | ICD-10-CM | POA: Diagnosis not present

## 2017-12-22 DIAGNOSIS — I160 Hypertensive urgency: Secondary | ICD-10-CM | POA: Diagnosis present

## 2017-12-22 DIAGNOSIS — Z66 Do not resuscitate: Secondary | ICD-10-CM | POA: Diagnosis present

## 2017-12-22 DIAGNOSIS — E039 Hypothyroidism, unspecified: Secondary | ICD-10-CM | POA: Diagnosis present

## 2017-12-22 DIAGNOSIS — Z79890 Hormone replacement therapy (postmenopausal): Secondary | ICD-10-CM

## 2017-12-22 DIAGNOSIS — E1122 Type 2 diabetes mellitus with diabetic chronic kidney disease: Secondary | ICD-10-CM | POA: Diagnosis present

## 2017-12-22 DIAGNOSIS — I340 Nonrheumatic mitral (valve) insufficiency: Secondary | ICD-10-CM | POA: Diagnosis not present

## 2017-12-22 DIAGNOSIS — F0390 Unspecified dementia without behavioral disturbance: Secondary | ICD-10-CM | POA: Diagnosis present

## 2017-12-22 DIAGNOSIS — Z8673 Personal history of transient ischemic attack (TIA), and cerebral infarction without residual deficits: Secondary | ICD-10-CM | POA: Diagnosis not present

## 2017-12-22 DIAGNOSIS — N189 Chronic kidney disease, unspecified: Secondary | ICD-10-CM | POA: Diagnosis present

## 2017-12-22 DIAGNOSIS — IMO0001 Reserved for inherently not codable concepts without codable children: Secondary | ICD-10-CM

## 2017-12-22 DIAGNOSIS — Z7982 Long term (current) use of aspirin: Secondary | ICD-10-CM

## 2017-12-22 DIAGNOSIS — Z79899 Other long term (current) drug therapy: Secondary | ICD-10-CM

## 2017-12-22 DIAGNOSIS — Z9114 Patient's other noncompliance with medication regimen: Secondary | ICD-10-CM

## 2017-12-22 DIAGNOSIS — Z794 Long term (current) use of insulin: Secondary | ICD-10-CM | POA: Diagnosis not present

## 2017-12-22 DIAGNOSIS — N184 Chronic kidney disease, stage 4 (severe): Secondary | ICD-10-CM | POA: Diagnosis not present

## 2017-12-22 DIAGNOSIS — I5032 Chronic diastolic (congestive) heart failure: Secondary | ICD-10-CM | POA: Diagnosis not present

## 2017-12-22 DIAGNOSIS — Z888 Allergy status to other drugs, medicaments and biological substances status: Secondary | ICD-10-CM

## 2017-12-22 DIAGNOSIS — R079 Chest pain, unspecified: Secondary | ICD-10-CM | POA: Diagnosis not present

## 2017-12-22 DIAGNOSIS — I2510 Atherosclerotic heart disease of native coronary artery without angina pectoris: Secondary | ICD-10-CM | POA: Diagnosis not present

## 2017-12-22 DIAGNOSIS — R0602 Shortness of breath: Secondary | ICD-10-CM | POA: Diagnosis not present

## 2017-12-22 DIAGNOSIS — I161 Hypertensive emergency: Secondary | ICD-10-CM | POA: Diagnosis not present

## 2017-12-22 DIAGNOSIS — D631 Anemia in chronic kidney disease: Secondary | ICD-10-CM | POA: Diagnosis present

## 2017-12-22 DIAGNOSIS — R5383 Other fatigue: Secondary | ICD-10-CM | POA: Diagnosis not present

## 2017-12-22 DIAGNOSIS — I132 Hypertensive heart and chronic kidney disease with heart failure and with stage 5 chronic kidney disease, or end stage renal disease: Secondary | ICD-10-CM | POA: Diagnosis present

## 2017-12-22 DIAGNOSIS — Z885 Allergy status to narcotic agent status: Secondary | ICD-10-CM | POA: Diagnosis not present

## 2017-12-22 DIAGNOSIS — I10 Essential (primary) hypertension: Secondary | ICD-10-CM | POA: Diagnosis present

## 2017-12-22 LAB — CBC WITH DIFFERENTIAL/PLATELET
BASOS ABS: 0 10*3/uL (ref 0.0–0.1)
Basophils Relative: 0 %
Eosinophils Absolute: 0.1 10*3/uL (ref 0.0–0.7)
Eosinophils Relative: 2 %
HEMATOCRIT: 28.7 % — AB (ref 36.0–46.0)
Hemoglobin: 8.8 g/dL — ABNORMAL LOW (ref 12.0–15.0)
LYMPHS ABS: 0.8 10*3/uL (ref 0.7–4.0)
LYMPHS PCT: 14 %
MCH: 29.4 pg (ref 26.0–34.0)
MCHC: 30.7 g/dL (ref 30.0–36.0)
MCV: 96 fL (ref 78.0–100.0)
MONO ABS: 0.4 10*3/uL (ref 0.1–1.0)
MONOS PCT: 7 %
NEUTROS ABS: 4.2 10*3/uL (ref 1.7–7.7)
Neutrophils Relative %: 76 %
Platelets: 168 10*3/uL (ref 150–400)
RBC: 2.99 MIL/uL — ABNORMAL LOW (ref 3.87–5.11)
RDW: 13.9 % (ref 11.5–15.5)
WBC: 5.6 10*3/uL (ref 4.0–10.5)

## 2017-12-22 LAB — GLUCOSE, CAPILLARY
Glucose-Capillary: 161 mg/dL — ABNORMAL HIGH (ref 65–99)
Glucose-Capillary: 148 mg/dL — ABNORMAL HIGH (ref 65–99)

## 2017-12-22 LAB — TROPONIN I
TROPONIN I: 4.47 ng/mL — AB (ref <0.03)
Troponin I: 0.23 ng/mL (ref <0.03)
Troponin I: 2.32 ng/mL (ref <0.03)

## 2017-12-22 LAB — BASIC METABOLIC PANEL
ANION GAP: 12 (ref 5–15)
BUN: 58 mg/dL — AB (ref 6–20)
CALCIUM: 9.5 mg/dL (ref 8.9–10.3)
CO2: 21 mmol/L — AB (ref 22–32)
Chloride: 106 mmol/L (ref 101–111)
Creatinine, Ser: 3.3 mg/dL — ABNORMAL HIGH (ref 0.44–1.00)
GFR calc Af Amer: 14 mL/min — ABNORMAL LOW (ref >60)
GFR calc non Af Amer: 12 mL/min — ABNORMAL LOW (ref >60)
GLUCOSE: 190 mg/dL — AB (ref 65–99)
Potassium: 4.9 mmol/L (ref 3.5–5.1)
Sodium: 139 mmol/L (ref 135–145)

## 2017-12-22 LAB — I-STAT CHEM 8, ED
BUN: 51 mg/dL — AB (ref 6–20)
CALCIUM ION: 1.2 mmol/L (ref 1.15–1.40)
CREATININE: 3.5 mg/dL — AB (ref 0.44–1.00)
Chloride: 110 mmol/L (ref 101–111)
GLUCOSE: 177 mg/dL — AB (ref 65–99)
HCT: 24 % — ABNORMAL LOW (ref 36.0–46.0)
Hemoglobin: 8.2 g/dL — ABNORMAL LOW (ref 12.0–15.0)
Potassium: 4.9 mmol/L (ref 3.5–5.1)
Sodium: 141 mmol/L (ref 135–145)
TCO2: 21 mmol/L — ABNORMAL LOW (ref 22–32)

## 2017-12-22 LAB — MRSA PCR SCREENING: MRSA BY PCR: NEGATIVE

## 2017-12-22 LAB — I-STAT TROPONIN, ED: Troponin i, poc: 0.03 ng/mL (ref 0.00–0.08)

## 2017-12-22 MED ORDER — CLONIDINE HCL 0.2 MG PO TABS: 0.2000 mg | ORAL_TABLET | Freq: Two times a day (BID) | ORAL | Status: DC

## 2017-12-22 MED ORDER — FENTANYL CITRATE (PF) 100 MCG/2ML IJ SOLN
30.0000 ug | INTRAMUSCULAR | Status: DC | PRN
  Administered 2017-12-22 (×2): 30 ug via INTRAVENOUS
  Filled 2017-12-22 (×2): qty 2

## 2017-12-22 MED ORDER — HEPARIN BOLUS VIA INFUSION
3000.0000 [IU] | Freq: Once | INTRAVENOUS | Status: AC
  Administered 2017-12-22: 3000 [IU] via INTRAVENOUS
  Filled 2017-12-22: qty 3000

## 2017-12-22 MED ORDER — LABETALOL HCL 5 MG/ML IV SOLN: 10.0000 mg | INTRAVENOUS | Status: DC | PRN

## 2017-12-22 MED ORDER — HYDRALAZINE HCL 20 MG/ML IJ SOLN
10.0000 mg | Freq: Four times a day (QID) | INTRAMUSCULAR | Status: DC | PRN
  Administered 2017-12-22 (×2): 10 mg via INTRAVENOUS
  Filled 2017-12-22 (×2): qty 1

## 2017-12-22 MED ORDER — HYDRALAZINE HCL 25 MG PO TABS
50.0000 mg | ORAL_TABLET | Freq: Two times a day (BID) | ORAL | Status: DC
  Administered 2017-12-22: 50 mg via ORAL
  Filled 2017-12-22: qty 2

## 2017-12-22 MED ORDER — ASPIRIN 81 MG PO CHEW
324.0000 mg | CHEWABLE_TABLET | Freq: Once | ORAL | Status: AC
  Administered 2017-12-22: 324 mg via ORAL
  Filled 2017-12-22: qty 4

## 2017-12-22 MED ORDER — NITROGLYCERIN 0.4 MG SL SUBL
0.4000 mg | SUBLINGUAL_TABLET | SUBLINGUAL | Status: DC | PRN
  Administered 2017-12-22 (×2): 0.4 mg via SUBLINGUAL
  Filled 2017-12-22: qty 1

## 2017-12-22 MED ORDER — INSULIN NPH (HUMAN) (ISOPHANE) 100 UNIT/ML ~~LOC~~ SUSP
10.0000 [IU] | Freq: Two times a day (BID) | SUBCUTANEOUS | Status: DC
  Administered 2017-12-22 – 2017-12-25 (×6): 10 [IU] via SUBCUTANEOUS
  Filled 2017-12-22: qty 10

## 2017-12-22 MED ORDER — NITROGLYCERIN 0.4 MG SL SUBL
0.4000 mg | SUBLINGUAL_TABLET | SUBLINGUAL | Status: DC | PRN
  Administered 2017-12-23 (×3): 0.4 mg via SUBLINGUAL
  Filled 2017-12-22 (×3): qty 1

## 2017-12-22 MED ORDER — ISOSORBIDE MONONITRATE ER 60 MG PO TB24
120.0000 mg | ORAL_TABLET | Freq: Every day | ORAL | Status: DC
  Administered 2017-12-23 – 2017-12-25 (×3): 120 mg via ORAL
  Filled 2017-12-22 (×3): qty 2

## 2017-12-22 MED ORDER — LEVOTHYROXINE SODIUM 25 MCG PO TABS
50.0000 ug | ORAL_TABLET | ORAL | Status: DC
  Administered 2017-12-23 – 2017-12-25 (×3): 50 ug via ORAL
  Filled 2017-12-22 (×3): qty 2

## 2017-12-22 MED ORDER — HYDROCODONE-ACETAMINOPHEN 5-325 MG PO TABS
0.5000 | ORAL_TABLET | Freq: Four times a day (QID) | ORAL | Status: DC | PRN
  Administered 2017-12-22: 1 via ORAL
  Filled 2017-12-22: qty 1

## 2017-12-22 MED ORDER — ENSURE ENLIVE PO LIQD
237.0000 mL | Freq: Two times a day (BID) | ORAL | Status: DC
  Administered 2017-12-23 – 2017-12-25 (×3): 237 mL via ORAL

## 2017-12-22 MED ORDER — HEPARIN (PORCINE) IN NACL 100-0.45 UNIT/ML-% IJ SOLN
1000.0000 [IU]/h | INTRAMUSCULAR | Status: AC
  Administered 2017-12-22 – 2017-12-24 (×2): 700 [IU]/h via INTRAVENOUS
  Filled 2017-12-22 (×2): qty 250

## 2017-12-22 MED ORDER — LABETALOL HCL 5 MG/ML IV SOLN
10.0000 mg | Freq: Once | INTRAVENOUS | Status: AC
  Administered 2017-12-22: 10 mg via INTRAVENOUS
  Filled 2017-12-22: qty 4

## 2017-12-22 MED ORDER — GUAIFENESIN-DM 100-10 MG/5ML PO SYRP
5.0000 mL | ORAL_SOLUTION | ORAL | Status: DC | PRN
  Administered 2017-12-22: 5 mL via ORAL
  Filled 2017-12-22: qty 5

## 2017-12-22 MED ORDER — NITROGLYCERIN 2 % TD OINT
1.0000 [in_us] | TOPICAL_OINTMENT | Freq: Once | TRANSDERMAL | Status: AC
  Administered 2017-12-22: 1 [in_us] via TOPICAL
  Filled 2017-12-22: qty 1

## 2017-12-22 MED ORDER — ISOSORBIDE MONONITRATE ER 60 MG PO TB24
120.0000 mg | ORAL_TABLET | Freq: Once | ORAL | Status: AC
  Administered 2017-12-22: 120 mg via ORAL
  Filled 2017-12-22: qty 2

## 2017-12-22 MED ORDER — HEPARIN SODIUM (PORCINE) 5000 UNIT/ML IJ SOLN
5000.0000 [IU] | Freq: Three times a day (TID) | INTRAMUSCULAR | Status: DC
  Administered 2017-12-22: 5000 [IU] via SUBCUTANEOUS
  Filled 2017-12-22: qty 1

## 2017-12-22 MED ORDER — LEVOFLOXACIN 250 MG PO TABS
250.0000 mg | ORAL_TABLET | ORAL | Status: DC
  Administered 2017-12-22: 250 mg via ORAL
  Filled 2017-12-22: qty 1

## 2017-12-22 MED ORDER — ONDANSETRON HCL 4 MG/2ML IJ SOLN
INTRAMUSCULAR | Status: AC
  Administered 2017-12-22: 4 mg via INTRAVENOUS
  Filled 2017-12-22: qty 2

## 2017-12-22 MED ORDER — SUCRALFATE 1 GM/10ML PO SUSP
1.0000 g | Freq: Four times a day (QID) | ORAL | Status: DC
  Administered 2017-12-22 – 2017-12-25 (×12): 1 g via ORAL
  Filled 2017-12-22 (×12): qty 10

## 2017-12-22 MED ORDER — TORSEMIDE 20 MG PO TABS
20.0000 mg | ORAL_TABLET | Freq: Two times a day (BID) | ORAL | Status: DC
  Administered 2017-12-22 – 2017-12-25 (×6): 20 mg via ORAL
  Filled 2017-12-22 (×6): qty 1

## 2017-12-22 MED ORDER — VITAMIN D 1000 UNITS PO TABS
1000.0000 [IU] | ORAL_TABLET | Freq: Every day | ORAL | Status: DC
  Administered 2017-12-23 – 2017-12-25 (×3): 1000 [IU] via ORAL
  Filled 2017-12-22 (×3): qty 1

## 2017-12-22 MED ORDER — CARVEDILOL 3.125 MG PO TABS
6.2500 mg | ORAL_TABLET | Freq: Two times a day (BID) | ORAL | Status: DC
  Administered 2017-12-22 – 2017-12-23 (×2): 6.25 mg via ORAL
  Filled 2017-12-22 (×2): qty 2

## 2017-12-22 MED ORDER — ROSUVASTATIN CALCIUM 10 MG PO TABS
10.0000 mg | ORAL_TABLET | Freq: Every day | ORAL | Status: DC
  Administered 2017-12-23 – 2017-12-25 (×3): 10 mg via ORAL
  Filled 2017-12-22 (×3): qty 1

## 2017-12-22 MED ORDER — ONDANSETRON HCL 4 MG/2ML IJ SOLN
4.0000 mg | Freq: Four times a day (QID) | INTRAMUSCULAR | Status: DC | PRN
  Administered 2017-12-23: 4 mg via INTRAVENOUS
  Filled 2017-12-22: qty 2

## 2017-12-22 MED ORDER — ONDANSETRON HCL 4 MG/2ML IJ SOLN
4.0000 mg | Freq: Once | INTRAMUSCULAR | Status: AC
  Administered 2017-12-22: 4 mg via INTRAVENOUS

## 2017-12-22 MED ORDER — PANTOPRAZOLE SODIUM 40 MG PO TBEC
40.0000 mg | DELAYED_RELEASE_TABLET | Freq: Two times a day (BID) | ORAL | Status: DC
  Administered 2017-12-22 – 2017-12-25 (×6): 40 mg via ORAL
  Filled 2017-12-22 (×6): qty 1

## 2017-12-22 MED ORDER — ACETAMINOPHEN 325 MG PO TABS
650.0000 mg | ORAL_TABLET | ORAL | Status: DC | PRN
  Administered 2017-12-22: 650 mg via ORAL
  Filled 2017-12-22: qty 2

## 2017-12-22 MED ORDER — ASPIRIN EC 81 MG PO TBEC
81.0000 mg | DELAYED_RELEASE_TABLET | Freq: Every day | ORAL | Status: DC
  Administered 2017-12-23 – 2017-12-25 (×3): 81 mg via ORAL
  Filled 2017-12-22 (×3): qty 1

## 2017-12-22 MED ORDER — CLONIDINE HCL 0.3 MG/24HR TD PTWK
0.3000 mg | MEDICATED_PATCH | TRANSDERMAL | Status: DC
  Administered 2017-12-22: 0.3 mg via TRANSDERMAL
  Filled 2017-12-22: qty 1

## 2017-12-22 NOTE — Progress Notes (Signed)
Nutrition Brief Note  Patient identified on the Malnutrition Screening Tool (MST) Report. She had reported being unsure as to whether or not she had lost any weight  Wt Readings from Last 15 Encounters:  12/22/17 133 lb (60.3 kg)  12/01/17 133 lb 12.8 oz (60.7 kg)  11/25/17 134 lb 4.2 oz (60.9 kg)  11/24/17 136 lb (61.7 kg)  11/16/17 136 lb 9.6 oz (62 kg)  11/10/17 139 lb (63 kg)  10/24/17 139 lb (63 kg)  10/23/17 139 lb 6.4 oz (63.2 kg)  10/18/17 135 lb 11.2 oz (61.6 kg)  10/09/17 143 lb 3.2 oz (65 kg)  10/03/17 138 lb (62.6 kg)  09/24/17 136 lb 11.2 oz (62 kg)  09/19/17 150 lb (68 kg)  09/18/17 150 lb 3.2 oz (68.1 kg)  09/04/17 142 lb 6.4 oz (64.6 kg)   Body mass index is 23.56 kg/m. Patient meets criteria for healthy wt for ht based on current BMI.   Patient seen up in bed. Her lunch tray is in front of her, completely untouched. She is moaning and does not verbalize much. She says she is hungry, but she doesn't want any of whats on her tray.   She says her UBW is "somewhere around" 130 lbs. Per review of chart, her weights are quite variable. She has dxs of HF and CKD and is on diuretics. She may exhibit a slight negative trend in her weight.   However, pt reports a good intake and PTA, when asked about her appetite and eating, she says "there is no problem with that".   RD asked what she wanted to eat, but she couldn't think of anything. RD listed items. A cheeseburger was the only thing she thought sounded good. She sounds to eat fast food frequently outpatient. Given her complete refusal of lunch, feel it is appropriate for her to have a burger tonight. RD gave approval to dietary ambassador.   Current diet order is heart healthy. Labs and medications reviewed.   Patient says she has had no problems eating and has a good appetite, just doesn't like the food she was served. No nutrition interventions warranted at this time. If nutrition issues arise, please consult RD.    Burtis Junes RD, LDN, CNSC Clinical Nutrition Pager: [phone] 12/22/2017 3:37 PM

## 2017-12-22 NOTE — H&P (Signed)
Patient Demographics:    [name], is a 84 y.o. female  MRN: [id number]   DOB - March 10, 1932  Admit Date - 12/22/2017  Outpatient Primary MD for the patient is Timmothy Euler, MD   Assessment & Plan:    Principal Problem:   NSTEMI (non-ST elevated myocardial infarction) [facility]) Active Problems:   HTN (hypertension)   CAD S/P PCI '03 and '08   Anemia in chronic kidney disease   Insulin dependent diabetes mellitus with complications (HCC)   Chronic kidney disease, stage IV (severe) (HCC)   Chest pain   Hypertensive urgency    1)NSTEMI-  troponin trended up above 2.3, conference at bedside with patient, 2 daughters and son-in-law, they declined transfer to [facility] for cardiology evaluation for possible left heart catheterization and possible angioplasty with stent placement.  In the past with similar situations patient has opted for medical management of ACS/CAD due to concerns about kidney failure.  Please note the patient had angioplasty and stent placement previously in 2003 in 2008, currently she does have CKD stage IV-V.  Admit to ICU. Anemia of CKD noted with hemoglobin below 9 but no evidence of ongoing bleeding, patient and family okay with trial of IV heparin, continuation of Coreg, aspirin and statin, echo pending to evaluate wall motion abnormalities and EF  2)CKD stage IV-V-avoid nephrotoxic agents, creatinine is between 3.3-3.5  3)Anemia of CKD-patient was getting Procrit shots until recently, hemoglobin is 8.8 check stool for occult blood, watch for any bleeding, transfuse as clinically indicated given ACS will keep hemoglobin close to 9 or above 9.   4)Social/Ethics-advanced directives and CODE STATUS discussed with patient, 2 daughters and son-in-law at bedside, RN Olean Ree at bedside  as well, patient is a DNR/DNI, patient wants medical management for ACS and STEMI  5)HTN-stage II hypertension, initially systolic blood pressure was over 200 partly due to noncompliance, give Coreg 6.25 mg twice daily, clonidine patch 0.3 mg q. Weekly, isosorbide 120 mg daily and hydralazine 50 mg 3 times daily,   may use IV labetalol 10 mg  Every 4 hours Prn for systolic blood pressure over 160 mmhg  6) hypothyroidism-continue levothyroxine, check TSH   With History of - Reviewed by me  Past Medical History:  Diagnosis Date  . CAD (coronary artery disease) 2003   Last catheterization 2008. Two-vessel PCI of the first OM branch and mid LAD.  Marland Kitchen CKD (chronic kidney disease)   . Congestive heart failure ([place]) 02/2017   grade 2 diastolic dysfunction  . CVA (cerebral vascular accident) ([place]) [place]   . Dementia   . DJD (degenerative joint disease) of lumbar spine   . Hypercholesteremia   . Hypertension   . IDDM (insulin dependent diabetes mellitus) ([place])       Past Surgical History:  Procedure Laterality Date  . APPENDECTOMY    . KNEE SURGERY        Chief  Complaint  Patient presents with  . Chest Pain      HPI:    [name]  is a 84 y.o. female with past medical history relevant for previously known CAD (PCI 2003 and 2008), CKD IV, chronic diastolic dysfunction CHF (EF 60 %), h/o CVA and hypertension who presents to the ED with presents with anterior chest pain, radiating down both arms since 330 am.  Despite aspirin and nitroglycerin chest discomfort persisted.  Chest pain persisted at rest, patient did not really do much activity  Some nausea but no emesis, no neck pains no pleuritic symptoms no fever no chills no productive cough.  No diaphoresis, no palpitations and no dizziness  In ED...... patient found to have very elevated blood pressures, she admits to noncompliance with clonidine and other medications  Later in the day her troponin trended up above  2.3, conference at bedside with patient, 2 daughters and son-in-law, they declined transfer to [facility] for cardiology evaluation for possible left heart catheterization and possible angioplasty with stent placement.  In the past with similar situations patient has opted for medical management of ACS/CAD due to concerns about kidney failure.  Please note the patient had angioplasty and stent placement previously in 2003 in 2008, currently she does have CKD stage IV-V.  EKG without acute ST elevation  Anemia of CKD noted with hemoglobin below 9 but no evidence of ongoing bleeding, patient and family okay with trial of IV heparin, continuation of beta-blockers, aspirin and statin  After nitroglycerin paste in the ED patient's chest pain subsided and actually resolved, when the troponin was trending up patient remained chest pain-free    Review of systems:    In addition to the HPI above,   A full 12 point Review of 10 Systems was done, except as stated above, all other Review of 10 Systems were negative.    Social History:  Reviewed by me    Social History   Tobacco Use  . Smoking status: Never Smoker  . Smokeless tobacco: Never Used  Substance Use Topics  . Alcohol use: No       Family History :  Reviewed by me    Family History  Problem Relation Age of Onset  . Heart attack Mother 69  . Heart attack Father 49  . Diabetes Brother        RETINOPATHY   . Drug abuse Brother   . Liver cancer Brother   . Breast cancer Daughter   . Diabetes Sister      Home Medications:   Prior to Admission medications   Medication Sig Start Date End Date Taking? Authorizing Provider  aspirin EC 81 MG EC tablet Take 1 tablet (81 mg total) by mouth daily. 10/08/16  Yes Bhagat, Bhavinkumar, PA  carvedilol (COREG) 6.25 MG tablet Take 1 tablet (6.25 mg total) by mouth 2 (two) times daily with a meal. 11/27/17  Yes Charlynne Cousins, MD  cholecalciferol (VITAMIN D) 1000 units  tablet Take 1,000 Units by mouth daily.   Yes [provider]  cloNIDine (CATAPRES) 0.1 MG tablet TAKE (1) TABLET TWICE A DAY. 12/12/17  Yes Timmothy Euler, MD  diclofenac sodium (VOLTAREN) 1 % GEL Apply 4 g topically 4 (four) times daily. Patient taking differently: Apply 4 g topically 4 (four) times daily as needed (FOR PAIN).  07/29/16  Yes Timmothy Euler, MD  hydrALAZINE (APRESOLINE) 50 MG tablet Take 1 tablet (50 mg total) by mouth 2 (two) times  daily. Patient taking differently: Take 50 mg by mouth daily.  11/27/17  Yes Charlynne Cousins, MD  HYDROcodone-acetaminophen (NORCO) 5-325 MG tablet Take 0.5-1 tablets every 6 (six) hours as needed by mouth for moderate pain. 10/09/17  Yes Timmothy Euler, MD  insulin NPH Human (HUMULIN N) 100 UNIT/ML injection Inject 0.1 mLs (10 Units total) into the skin 2 (two) times daily before a meal. Patient taking differently: Inject 10 Units into the skin 2 (two) times daily before a meal. GIVES BASED ON BLOOD SUGAR LEVELS. IF UNDER 120-NO INSULIN IS GIVEN 02/03/17  Yes Timmothy Euler, MD  isosorbide mononitrate (IMDUR) 120 MG 24 hr tablet Take 1 tablet (120 mg total) by mouth daily. 11/27/17  Yes Charlynne Cousins, MD  levothyroxine (SYNTHROID, LEVOTHROID) 50 MCG tablet TAKE 1 TABLET DAILY 04/03/17  Yes Timmothy Euler, MD  nitroGLYCERIN (NITROSTAT) 0.4 MG SL tablet PLACE 1 TABLET UNDER THE TONGUE AT ONSET OF CHEST PAIN EVERY 5 MINTUES UP TO 3 TIMES AS NEEDED 02/27/17  Yes Timmothy Euler, MD  pantoprazole (PROTONIX) 40 MG tablet Take 1 tablet (40 mg total) by mouth 2 (two) times daily. X one month, then once daily Patient taking differently: Take 40 mg by mouth daily. X one month, then once daily 09/24/17  Yes Tat, David, MD  polyethylene glycol powder (GLYCOLAX/MIRALAX) powder MIX 1 CAPFUL (17 GRAMS) WITH 8OZ OF WATER OR JUICE DAILY. Patient taking differently: MIX 1 CAPFUL (17 GRAMS) WITH 8OZ OF WATER OR JUICE DAILY AS NEEDED FOR  CONSTIPATION 12/13/16  Yes Timmothy Euler, MD  rosuvastatin (CRESTOR) 10 MG tablet TAKE 1 TABLET DAILY 11/22/17  Yes Timmothy Euler, MD  sodium polystyrene (KAYEXALATE) 15 GM/60ML suspension Take 60 mLs (15 g total) by mouth daily. Patient taking differently: Take 15 g by mouth daily as needed.  03/17/17  Yes Timmothy Euler, MD  sucralfate (CARAFATE) 1 GM/10ML suspension Take 10 mLs (1 g total) by mouth every 6 (six) hours. Patient taking differently: Take 1 g by mouth daily as needed.  03/27/17  Yes Timmothy Euler, MD  torsemide (DEMADEX) 20 MG tablet Take 1 tablet (20 mg total) by mouth 2 (two) times daily. 09/24/17  Yes Orson Eva, MD     Allergies:     Allergies  Allergen Reactions  . Propoxyphene N-Acetaminophen     Pt does not know reaction  . Simvastatin Other (See Comments)    headache     Physical Exam:   Vitals  Blood pressure (!) 167/59, pulse 81, temperature 98.4 F (36.9 C), temperature source Oral, resp. rate 18, height 5\' 3"  (1.6 m), weight 60.3 kg (133 lb), SpO2 100 %.  Physical Examination: General appearance - alert, well appearing, and in no distress and  Mental status - alert, oriented to person, place, and time,  Eyes - sclera anicteric Neck - supple, no JVD elevation , Chest - clear  to auscultation bilaterally, symmetrical air movement,  Heart - S1 and S2 normal,  Abdomen - soft, nontender, nondistended, no masses or organomegaly Neurological - screening mental status exam normal, neck supple without rigidity, cranial nerves II through XII intact, DTR's normal and symmetric Extremities - no pedal edema noted, intact peripheral pulses  Skin - warm, dry Psych-affect is appropriate   Data Review:    CBC Recent Labs  Lab 12/22/17 0709 12/22/17 0733  WBC  --  5.6  HGB 8.2* 8.8*  HCT 24.0* 28.7*  PLT  --  168  MCV  --  96.0  MCH  --  29.4  MCHC  --  30.7  RDW  --  13.9  LYMPHSABS  --  0.8  MONOABS  --  0.4  EOSABS  --  0.1    BASOSABS  --  0.0   ------------------------------------------------------------------------------------------------------------------  Chemistries  Recent Labs  Lab 12/22/17 0709 12/22/17 0733  NA 141 139  K 4.9 4.9  CL 110 106  CO2  --  21*  GLUCOSE 177* 190*  BUN 51* 58*  CREATININE 3.50* 3.30*  CALCIUM  --  9.5   ------------------------------------------------------------------------------------------------------------------ estimated creatinine clearance is 10.7 mL/min (A) (by C-G formula based on SCr of 3.3 mg/dL (H)). ------------------------------------------------------------------------------------------------------------------ No results for input(s): TSH, T4TOTAL, T3FREE, THYROIDAB in the last 72 hours.  Invalid input(s): FREET3   Coagulation profile No results for input(s): INR, PROTIME in the last 168 hours. ------------------------------------------------------------------------------------------------------------------- No results for input(s): DDIMER in the last 72 hours. -------------------------------------------------------------------------------------------------------------------  Cardiac Enzymes Recent Labs  Lab 12/22/17 1116 12/22/17 1631  TROPONINI 0.23* 2.32*   ------------------------------------------------------------------------------------------------------------------    Component Value Date/Time   BNP 1,243.0 (H) 09/20/2017 1940     ---------------------------------------------------------------------------------------------------------------  Urinalysis    Component Value Date/Time   COLORURINE YELLOW 10/15/2017 0910   APPEARANCEUR Clear 11/16/2017 1213   LABSPEC 1.012 10/15/2017 0910   PHURINE 5.0 10/15/2017 0910   GLUCOSEU Trace (A) 11/16/2017 1213   HGBUR NEGATIVE 10/15/2017 0910   BILIRUBINUR Negative 11/16/2017 1213   KETONESUR NEGATIVE 10/15/2017 0910   PROTEINUR 3+ (A) 11/16/2017 1213   PROTEINUR 100 (A)  10/15/2017 0910   UROBILINOGEN negative 07/02/2015 1505   UROBILINOGEN 0.2 05/10/2015 1245   NITRITE Negative 11/16/2017 1213   NITRITE NEGATIVE 10/15/2017 0910   LEUKOCYTESUR Trace (A) 11/16/2017 1213    ----------------------------------------------------------------------------------------------------------------   Imaging Results:    Dg Chest Portable 1 View  Result Date: 12/22/2017 CLINICAL DATA:  Mid sternal chest pain EXAM: PORTABLE CHEST 1 VIEW COMPARISON:  11/24/2017 FINDINGS: Heart is borderline in size. Bibasilar opacities likely reflect atelectasis. No overt edema. No acute bony abnormality IMPRESSION: Bibasilar opacities, likely atelectasis. Electronically Signed   By: Rolm Baptise M.D.   On: 12/22/2017 07:52    Radiological Exams on Admission: Dg Chest Portable 1 View  Result Date: 12/22/2017 CLINICAL DATA:  Mid sternal chest pain EXAM: PORTABLE CHEST 1 VIEW COMPARISON:  11/24/2017 FINDINGS: Heart is borderline in size. Bibasilar opacities likely reflect atelectasis. No overt edema. No acute bony abnormality IMPRESSION: Bibasilar opacities, likely atelectasis. Electronically Signed   By: Rolm Baptise M.D.   On: 12/22/2017 07:52    DVT Prophylaxis - iv Heaprin AM Labs Ordered, also please review Full Orders  Family Communication: Admission, patients condition and plan of care including tests being ordered have been discussed with the patient and daughters who indicate understanding and agree with the plan   Code Status - Full Code  Likely DC to  home  Condition   Fair, overall prognosis is poor  Roxan Hockey M.D on 12/22/2017 at 6:32 PM   Between 7am to 7pm - Pager - [phone] After 7pm go to [email or web address] - password [email or web address]  [facility] - Office  [phone]  Voice Recognition Viviann Spare dictation system was used to create this note, attempts have been made to correct errors. Please contact the author with questions and/or clarifications.

## 2017-12-22 NOTE — Progress Notes (Signed)
Mid level notified of critical troponin 4.47 at 2330. Also informed of heparin infusion

## 2017-12-22 NOTE — ED Notes (Signed)
Placed pt on 2L via 02, 02 dropped to 88% on room air, pt sleeping after pain medication

## 2017-12-22 NOTE — ED Triage Notes (Signed)
Mid sternal chest pain onset approx 0330, had a total of 2 ntg with minimal relief.

## 2017-12-22 NOTE — Progress Notes (Signed)
ANTICOAGULATION CONSULT NOTE - Initial Consult  Pharmacy Consult for HEPARIN Indication: chest pain/ACS  Allergies  Allergen Reactions  . Propoxyphene N-Acetaminophen     Pt does not know reaction  . Simvastatin Other (See Comments)    headache    Patient Measurements: Height: 5\' 3"  (160 cm) Weight: 133 lb (60.3 kg) IBW/kg (Calculated) : 52.4 HEPARIN DW (KG): 60.3  Vital Signs: Temp: 98.7 F (37.1 C) (01/25 1830) Temp Source: Oral (01/25 1830) BP: 201/65 (01/25 1830) Pulse Rate: 85 (01/25 1830)  Labs: Recent Labs    12/22/17 0709 12/22/17 0733 12/22/17 1116 12/22/17 1631  HGB 8.2* 8.8*  --   --   HCT 24.0* 28.7*  --   --   PLT  --  168  --   --   CREATININE 3.50* 3.30*  --   --   TROPONINI  --   --  0.23* 2.32*   Estimated Creatinine Clearance: 10.7 mL/min (A) (by C-G formula based on SCr of 3.3 mg/dL (H)).  Medical History: Past Medical History:  Diagnosis Date  . CAD (coronary artery disease) 2003   Last catheterization 2008. Two-vessel PCI of the first OM branch and mid LAD.  Marland Kitchen CKD (chronic kidney disease)   . Congestive heart failure ([place]) 02/2017   grade 2 diastolic dysfunction  . CVA (cerebral vascular accident) ([place]) [place]   . Dementia   . DJD (degenerative joint disease) of lumbar spine   . Hypercholesteremia   . Hypertension   . IDDM (insulin dependent diabetes mellitus) ([place])    Medications:  Medications Prior to Admission  Medication Sig Dispense Refill Last Dose  . aspirin EC 81 MG EC tablet Take 1 tablet (81 mg total) by mouth daily. 30 tablet 11 12/22/2017  . carvedilol (COREG) 6.25 MG tablet Take 1 tablet (6.25 mg total) by mouth 2 (two) times daily with a meal. 60 tablet 0 12/22/2017  . cholecalciferol (VITAMIN D) 1000 units tablet Take 1,000 Units by mouth daily.   12/22/2017  . cloNIDine (CATAPRES) 0.1 MG tablet TAKE (1) TABLET TWICE A DAY. 180 tablet 1 12/22/2017  . diclofenac sodium (VOLTAREN) 1 % GEL Apply 4 g topically 4  (four) times daily. (Patient taking differently: Apply 4 g topically 4 (four) times daily as needed (FOR PAIN). ) 100 g 2 12/22/2017  . hydrALAZINE (APRESOLINE) 50 MG tablet Take 1 tablet (50 mg total) by mouth 2 (two) times daily. (Patient taking differently: Take 50 mg by mouth daily. ) 30 tablet 0 12/22/2017  . HYDROcodone-acetaminophen (NORCO) 5-325 MG tablet Take 0.5-1 tablets every 6 (six) hours as needed by mouth for moderate pain. 30 tablet 0 12/22/2017  . insulin NPH Human (HUMULIN N) 100 UNIT/ML injection Inject 0.1 mLs (10 Units total) into the skin 2 (two) times daily before a meal. (Patient taking differently: Inject 10 Units into the skin 2 (two) times daily before a meal. GIVES BASED ON BLOOD SUGAR LEVELS. IF UNDER 120-NO INSULIN IS GIVEN) 10 mL 1 12/22/2017  . isosorbide mononitrate (IMDUR) 120 MG 24 hr tablet Take 1 tablet (120 mg total) by mouth daily. 30 tablet 0 12/22/2017  . levothyroxine (SYNTHROID, LEVOTHROID) 50 MCG tablet TAKE 1 TABLET DAILY 90 tablet 3 12/22/2017  . nitroGLYCERIN (NITROSTAT) 0.4 MG SL tablet PLACE 1 TABLET UNDER THE TONGUE AT ONSET OF CHEST PAIN EVERY 5 MINTUES UP TO 3 TIMES AS NEEDED 25 tablet 1 12/22/2017  . pantoprazole (PROTONIX) 40 MG tablet Take 1 tablet (40  mg total) by mouth 2 (two) times daily. X one month, then once daily (Patient taking differently: Take 40 mg by mouth daily. X one month, then once daily) 60 tablet 0 12/22/2017  . polyethylene glycol powder (GLYCOLAX/MIRALAX) powder MIX 1 CAPFUL (17 GRAMS) WITH 8OZ OF WATER OR JUICE DAILY. (Patient taking differently: MIX 1 CAPFUL (17 GRAMS) WITH 8OZ OF WATER OR JUICE DAILY AS NEEDED FOR CONSTIPATION) 255 g 5 12/22/2017  . rosuvastatin (CRESTOR) 10 MG tablet TAKE 1 TABLET DAILY 30 tablet 0 12/22/2017  . sodium polystyrene (KAYEXALATE) 15 GM/60ML suspension Take 60 mLs (15 g total) by mouth daily. (Patient taking differently: Take 15 g by mouth daily as needed. ) 500 mL 2 12/22/2017  . sucralfate (CARAFATE) 1  GM/10ML suspension Take 10 mLs (1 g total) by mouth every 6 (six) hours. (Patient taking differently: Take 1 g by mouth daily as needed. ) 420 mL 0 12/22/2017  . torsemide (DEMADEX) 20 MG tablet Take 1 tablet (20 mg total) by mouth 2 (two) times daily. 60 tablet 1 unknown    Assessment: 84yo female with h/o cardiac dz.  Pharmacy asked to initiate Heparin for ACS.   Goal of Therapy:  Heparin level 0.3-0.7 units/ml Monitor platelets by anticoagulation protocol: Yes   Plan:   Heparin 3000 units IV now x 1  Heparin infusion at 700 units/hr  Heparin level daily  CBC daily while on Heparin   Hart Robinsons A 12/22/2017,7:24 PM

## 2017-12-22 NOTE — ED Notes (Signed)
Report to Houston Siren , RN

## 2017-12-22 NOTE — ED Provider Notes (Addendum)
[facility] EMERGENCY DEPARTMENT Provider Note   CSN: [id number] Arrival date & time: 12/22/17  2831     History   Chief Complaint Chief Complaint  Patient presents with  . Chest Pain    HPI [name] is a 84 y.o. female.  Patient with history of CAD, CKD, CHF,  CVA presents with anterior chest pain, radiating down both arms since 330 am today.  Pt had nitro with minimal relief.  Pt on asa at home.  Patient does not know details of her medical history well.patient does have a cardiologist. Patient is compliant with her blood pressure medications. Patient does not know last cardiac testing that was done and per family was told she needed a stent however due to kidney function was unable to have catheterization. Patient denies blood clot history in the lung or leg, recent surgery. Patient is not very active due to all her medical history.      Past Medical History:  Diagnosis Date  . CAD (coronary artery disease) 2003   Last catheterization 2008. Two-vessel PCI of the first OM branch and mid LAD.  Marland Kitchen CKD (chronic kidney disease)   . Congestive heart failure ([place]) 02/2017   grade 2 diastolic dysfunction  . CVA (cerebral vascular accident) ([place]) [place]   . Dementia   . DJD (degenerative joint disease) of lumbar spine   . Hypercholesteremia   . Hypertension   . IDDM (insulin dependent diabetes mellitus) [facility])     Patient Active Problem List   Diagnosis Date Noted  . Hypertensive urgency 11/24/2017  . Acute encephalopathy 10/15/2017  . CHF exacerbation ([place]) 09/23/2017  . Acute renal failure superimposed on stage 4 chronic kidney disease ([place]) 09/22/2017  . CAD (coronary artery disease), native coronary artery 09/21/2017  . Pleural effusion, bilateral   . Acute on chronic diastolic CHF (congestive heart failure) ([place]) 09/20/2017  . Thrombocytopenia ([place]) 09/20/2017  . Abnormal CT scan of lung   . Chest pain 03/04/2017  . Esophagitis   . Occult blood in  stools 11/29/2016  . UTI (urinary tract infection) 11/29/2016  . Urinary retention 11/29/2016  . PAF (paroxysmal atrial fibrillation) ([place])   . Pressure injury of skin 11/22/2016  . Aortic valve sclerosis-2D June 2016 10/05/2016  . Chest pain with high risk of acute coronary syndrome 10/05/2016  . 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 Pulmonary hypertension ([place])   . Insulin dependent diabetes mellitus with complications ([place])   . Chronic kidney disease, stage IV (severe) ([place])   . Other constipation   . Failure to thrive in adult   . Hypomagnesemia   . CAD S/P PCI '03 and '08 05/11/2015  . Anemia in chronic kidney disease 05/11/2015  . Benign paroxysmal positional vertigo 03/21/2014  . HTN (hypertension) 08/09/2013  . Lumbar spondylosis 05/16/2013  . Overweight 05/15/2013  . Varicose veins of lower extremities with other complications 51/76/1607  . Normocytic anemia 10/29/2012  . Hypoglycemia 10/29/2012  . NSVT (nonsustained ventricular tachycardia) ([place]) 10/28/2012  . OA (osteoarthritis) 10/27/2012  . Cerebrovascular disease 03/09/2010  . Hyperlipidemia 12/05/2008    Past Surgical History:  Procedure Laterality Date  . APPENDECTOMY    . KNEE SURGERY      OB History    Gravida Para Term Preterm AB Living             5   SAB TAB Ectopic Multiple Live Births  Home Medications    Prior to Admission medications   Medication Sig Start Date End Date Taking? Authorizing Provider  aspirin EC 81 MG EC tablet Take 1 tablet (81 mg total) by mouth daily. 10/08/16   Leanor Kail, PA  carvedilol (COREG) 6.25 MG tablet Take 1 tablet (6.25 mg total) by mouth 2 (two) times daily with a meal. 11/27/17   Charlynne Cousins, MD  cholecalciferol (VITAMIN D) 1000 units tablet Take 1,000 Units by mouth daily.    [provider]  cloNIDine (CATAPRES - DOSED IN MG/24 HR) 0.3 mg/24hr patch Place 1 patch (0.3 mg total) onto the skin once a week. 12/01/17    Timmothy Euler, MD  cloNIDine (CATAPRES) 0.1 MG tablet TAKE (1) TABLET TWICE A DAY. 12/12/17   Timmothy Euler, MD  diclofenac sodium (VOLTAREN) 1 % GEL Apply 4 g topically 4 (four) times daily. Patient taking differently: Apply 4 g topically 4 (four) times daily as needed (FOR PAIN).  07/29/16   Timmothy Euler, MD  hydrALAZINE (APRESOLINE) 50 MG tablet Take 1 tablet (50 mg total) by mouth 2 (two) times daily. 11/27/17   Charlynne Cousins, MD  HYDROcodone-acetaminophen (NORCO) 5-325 MG tablet Take 0.5-1 tablets every 6 (six) hours as needed by mouth for moderate pain. 10/09/17   Timmothy Euler, MD  insulin NPH Human (HUMULIN N) 100 UNIT/ML injection Inject 0.1 mLs (10 Units total) into the skin 2 (two) times daily before a meal. Patient taking differently: Inject 10 Units into the skin 2 (two) times daily before a meal. GIVES BASED ON BLOOD SUGAR LEVELS. IF UNDER 120-NO INSULIN IS GIVEN 02/03/17   Timmothy Euler, MD  isosorbide mononitrate (IMDUR) 120 MG 24 hr tablet Take 1 tablet (120 mg total) by mouth daily. 11/27/17   Charlynne Cousins, MD  levofloxacin (LEVAQUIN) 250 MG tablet Take 1 tablet (250 mg total) by mouth every other day. 12/01/17   Timmothy Euler, MD  levothyroxine (SYNTHROID, LEVOTHROID) 50 MCG tablet TAKE 1 TABLET DAILY 04/03/17   Timmothy Euler, MD  nitroGLYCERIN (NITROSTAT) 0.4 MG SL tablet PLACE 1 TABLET UNDER THE TONGUE AT ONSET OF CHEST PAIN EVERY 5 MINTUES UP TO 3 TIMES AS NEEDED 02/27/17   Timmothy Euler, MD  pantoprazole (PROTONIX) 40 MG tablet Take 1 tablet (40 mg total) by mouth 2 (two) times daily. X one month, then once daily Patient taking differently: Take 40 mg by mouth daily. X one month, then once daily 09/24/17   Tat, David, MD  polyethylene glycol powder (GLYCOLAX/MIRALAX) powder MIX 1 CAPFUL (17 GRAMS) WITH 8OZ OF WATER OR JUICE DAILY. Patient taking differently: MIX 1 CAPFUL (17 GRAMS) WITH 8OZ OF WATER OR JUICE DAILY AS NEEDED FOR  CONSTIPATION 12/13/16   Timmothy Euler, MD  rosuvastatin (CRESTOR) 10 MG tablet TAKE 1 TABLET DAILY 11/22/17   Timmothy Euler, MD  sodium polystyrene (KAYEXALATE) 15 GM/60ML suspension Take 60 mLs (15 g total) by mouth daily. Patient taking differently: Take 15 g by mouth daily as needed.  03/17/17   Timmothy Euler, MD  sucralfate (CARAFATE) 1 GM/10ML suspension Take 10 mLs (1 g total) by mouth every 6 (six) hours. Patient taking differently: Take 1 g by mouth daily as needed.  03/27/17   Timmothy Euler, MD  torsemide (DEMADEX) 20 MG tablet Take 1 tablet (20 mg total) by mouth 2 (two) times daily. 09/24/17   Orson Eva, MD    Family History Family History  Problem Relation Age of Onset  . Heart attack Mother 60  . Heart attack Father 23  . Diabetes Brother        RETINOPATHY   . Drug abuse Brother   . Liver cancer Brother   . Breast cancer Daughter   . Diabetes Sister     Social History Social History   Tobacco Use  . Smoking status: Never Smoker  . Smokeless tobacco: Never Used  Substance Use Topics  . Alcohol use: No  . Drug use: No     Allergies   Propoxyphene n-acetaminophen and Simvastatin   Review of Systems Review of Systems  Constitutional: Positive for fatigue. Negative for chills and fever.  HENT: Negative for congestion.   Eyes: Negative for visual disturbance.  Respiratory: Positive for shortness of breath.   Cardiovascular: Positive for chest pain.  Gastrointestinal: Negative for abdominal pain and vomiting.  Genitourinary: Negative for dysuria and flank pain.  Musculoskeletal: Negative for back pain, neck pain and neck stiffness.  Skin: Negative for rash.  Neurological: Negative for light-headedness and headaches.     Physical Exam Updated Vital Signs BP (!) 191/70   Pulse 87   Temp (!) 97.5 F (36.4 C) (Oral)   Resp 13   Ht 5\' 3"  (1.6 m)   Wt 60.3 kg (133 lb)   SpO2 96%   BMI 23.56 kg/m   Physical Exam  Constitutional: She  is oriented to person, place, and time. She appears well-developed and well-nourished.  HENT:  Head: Normocephalic and atraumatic.  Eyes: Conjunctivae are normal. Right eye exhibits no discharge. Left eye exhibits no discharge.  Neck: Normal range of motion. Neck supple. No tracheal deviation present.  Cardiovascular: Normal rate and regular rhythm.  Pulmonary/Chest: Effort normal and breath sounds normal.  Abdominal: Soft. She exhibits no distension. There is no tenderness. There is no guarding.  Musculoskeletal: She exhibits no edema.       Right lower leg: She exhibits no edema.       Left lower leg: She exhibits no edema.  Neurological: She is alert and oriented to person, place, and time.  Skin: Skin is warm. No rash noted.  Psychiatric: She has a normal mood and affect.  Nursing note and vitals reviewed.    ED Treatments / Results  Labs (all labs ordered are listed, but only abnormal results are displayed) Labs Reviewed  CBC WITH DIFFERENTIAL/PLATELET - Abnormal; Notable for the following components:      Result Value   RBC 2.99 (*)    Hemoglobin 8.8 (*)    HCT 28.7 (*)    All other components within normal limits  BASIC METABOLIC PANEL - Abnormal; Notable for the following components:   CO2 21 (*)    Glucose, Bld 190 (*)    BUN 58 (*)    Creatinine, Ser 3.30 (*)    GFR calc non Af Amer 12 (*)    GFR calc Af Amer 14 (*)    All other components within normal limits  I-STAT CHEM 8, ED - Abnormal; Notable for the following components:   BUN 51 (*)    Creatinine, Ser 3.50 (*)    Glucose, Bld 177 (*)    TCO2 21 (*)    Hemoglobin 8.2 (*)    HCT 24.0 (*)    All other components within normal limits  I-STAT TROPONIN, ED    EKG  EKG Interpretation None     EKG reviewed heart rate 110, left bundle branch block,  LVH, nonspecific changes to previous, normal QT.  Radiology Dg Chest Portable 1 View  Result Date: 12/22/2017 CLINICAL DATA:  Mid sternal chest pain EXAM:  PORTABLE CHEST 1 VIEW COMPARISON:  11/24/2017 FINDINGS: Heart is borderline in size. Bibasilar opacities likely reflect atelectasis. No overt edema. No acute bony abnormality IMPRESSION: Bibasilar opacities, likely atelectasis. Electronically Signed   By: Rolm Baptise M.D.   On: 12/22/2017 07:52    Procedures .Critical Care Performed by: Elnora Morrison, MD Authorized by: Elnora Morrison, MD   Critical care provider statement:    Critical care time (minutes):  35   Critical care start time:  12/22/2017 8:00 AM   Critical care end time:  12/22/2017 8:35 AM   Critical care time was exclusive of:  Separately billable procedures and treating other patients   Critical care was necessary to treat or prevent imminent or life-threatening deterioration of the following conditions:  Cardiac failure   Critical care was time spent personally by me on the following activities:  Examination of patient, ordering and review of radiographic studies and ordering and review of laboratory studies   I assumed direction of critical care for this patient from another provider in my specialty: no     (including critical care time)  Medications Ordered in ED Medications  nitroGLYCERIN (NITROSTAT) SL tablet 0.4 mg (0.4 mg Sublingual Given 12/22/17 0743)  fentaNYL (SUBLIMAZE) injection 30 mcg (30 mcg Intravenous Given 12/22/17 0806)  labetalol (NORMODYNE,TRANDATE) injection 10 mg (not administered)  aspirin chewable tablet 324 mg (324 mg Oral Given 12/22/17 0734)  nitroGLYCERIN (NITROGLYN) 2 % ointment 1 inch (1 inch Topical Given 12/22/17 0808)  ondansetron (ZOFRAN) injection 4 mg (4 mg Intravenous Given 12/22/17 0849)     Initial Impression / Assessment and Plan / ED Course  I have reviewed the triage vital signs and the nursing notes.  Pertinent labs & imaging results that were available during my care of the patient were reviewed by me and considered in my medical decision making (see chart for details).      Patient presents with chest pain and uncontrolled blood pressure. On exam patient has persistent discomfort nature ordered and fentanyl. Mild improvement ER. Troponin negative. EKG had nonspecific changes. Nitro paste used. Reviewed past medical records and discuss with family and they prefer to stay at [facility] for medical management. Patient was told she needed a stent however due to her kidney function medical history unable to. Plan for IV bp medicines and admission to stepdown/telemetry. Will use nitro drip if required. Discussed with hospitalist.  The patients results and plan were reviewed and discussed.   Any x-rays performed were independently reviewed by myself.   Differential diagnosis were considered with the presenting HPI.  Medications  nitroGLYCERIN (NITROSTAT) SL tablet 0.4 mg (0.4 mg Sublingual Given 12/22/17 0743)  fentaNYL (SUBLIMAZE) injection 30 mcg (30 mcg Intravenous Given 12/22/17 0806)  labetalol (NORMODYNE,TRANDATE) injection 10 mg (not administered)  aspirin chewable tablet 324 mg (324 mg Oral Given 12/22/17 0734)  nitroGLYCERIN (NITROGLYN) 2 % ointment 1 inch (1 inch Topical Given 12/22/17 0808)  ondansetron (ZOFRAN) injection 4 mg (4 mg Intravenous Given 12/22/17 0849)    Vitals:   12/22/17 0740 12/22/17 0800 12/22/17 0830 12/22/17 0900  BP: (!) 202/80 (!) 197/77 (!) 202/82 (!) 191/70  Pulse: 96 96 95 87  Resp: 15 18 12 13   Temp:      TempSrc:      SpO2: 97% 98% 94% 96%  Weight:  Height:        Final diagnoses:  Hypertensive emergency  Acute chest pain    Admission/ observation were discussed with the admitting physician, patient and/or family and they are comfortable with the plan.    Final Clinical Impressions(s) / ED Diagnoses   Final diagnoses:  Hypertensive emergency  Acute chest pain    ED Discharge Orders    None       Elnora Morrison, MD 12/22/17 0930    Elnora Morrison, MD 12/26/17 [phone]

## 2017-12-22 NOTE — ED Notes (Signed)
MD at the bedside, family present

## 2017-12-22 NOTE — Progress Notes (Signed)
CRITICAL VALUE ALERT  Critical Value:  Troponin 0.23  Date & Time Notied:  12/22/2017 1211  Provider Notified: Dr. Denton Brick Orders Received/Actions taken: Patient denies pain, shortness of breath.  Doctor notified via text page.

## 2017-12-22 NOTE — Progress Notes (Signed)
CRITICAL VALUE ALERT  Critical Value:  Troponin 2.32  Date & Time Notied:  12/22/2017 1740  Provider Notified: Dr. Denton Brick  Orders Received/Actions taken: Notified via text page.  Dr. Denton Brick on unit to see patient.  Patient denies pain, shortness of breath.

## 2017-12-22 NOTE — Progress Notes (Signed)
Inpatient Diabetes Program Recommendations  AACE/ADA: New Consensus Statement on Inpatient Glycemic Control (2015)  Target Ranges:  Prepandial:   less than 140 mg/dL      Peak postprandial:   less than 180 mg/dL (1-2 hours)      Critically ill patients:  140 - 180 mg/dL   Lab Results  Component Value Date   GLUCAP 106 (H) 11/27/2017   HGBA1C 5.7 (H) 09/21/2017    Review of Glycemic Control  Diabetes history: DM 2 Outpatient Diabetes medications: NPH 10 units BID Current orders for Inpatient glycemic control: NPH 10 units BID  Inpatient Diabetes Program Recommendations:    While inpatient please consider Novolog Sensitive Correction 0-9 units tid.  Thanks,  Tama Headings RN, MSN, [facility] Inpatient Diabetes Coordinator Team Pager [phone] (8a-5p)

## 2017-12-22 NOTE — ED Notes (Signed)
Call for report- RN unaware of pt coming - will call back

## 2017-12-22 NOTE — ED Notes (Signed)
hospitalist in to assess  

## 2017-12-22 NOTE — ED Notes (Signed)
Med student at the bedside.

## 2017-12-22 NOTE — ED Notes (Signed)
Requesting more pain med,

## 2017-12-23 ENCOUNTER — Other Ambulatory Visit (HOSPITAL_COMMUNITY): Payer: Medicare Other

## 2017-12-23 ENCOUNTER — Other Ambulatory Visit: Payer: Self-pay

## 2017-12-23 LAB — CBC
HEMATOCRIT: 26.4 % — AB (ref 36.0–46.0)
Hemoglobin: 7.7 g/dL — ABNORMAL LOW (ref 12.0–15.0)
MCH: 28.4 pg (ref 26.0–34.0)
MCHC: 29.2 g/dL — AB (ref 30.0–36.0)
MCV: 97.4 fL (ref 78.0–100.0)
PLATELETS: 173 10*3/uL (ref 150–400)
RBC: 2.71 MIL/uL — ABNORMAL LOW (ref 3.87–5.11)
RDW: 13.6 % (ref 11.5–15.5)
WBC: 4 10*3/uL (ref 4.0–10.5)

## 2017-12-23 LAB — PREPARE RBC (CROSSMATCH)

## 2017-12-23 LAB — GLUCOSE, CAPILLARY
GLUCOSE-CAPILLARY: 126 mg/dL — AB (ref 65–99)
GLUCOSE-CAPILLARY: 113 mg/dL — AB (ref 65–99)
Glucose-Capillary: 144 mg/dL — ABNORMAL HIGH (ref 65–99)
Glucose-Capillary: 103 mg/dL — ABNORMAL HIGH (ref 65–99)

## 2017-12-23 LAB — HEPARIN LEVEL (UNFRACTIONATED): HEPARIN UNFRACTIONATED: 0.34 [IU]/mL (ref 0.30–0.70)

## 2017-12-23 MED ORDER — CALCIUM CARBONATE ANTACID 500 MG PO CHEW
1.0000 | CHEWABLE_TABLET | Freq: Two times a day (BID) | ORAL | Status: DC
  Administered 2017-12-23 – 2017-12-25 (×3): 200 mg via ORAL
  Filled 2017-12-23 (×4): qty 1

## 2017-12-23 MED ORDER — HYDRALAZINE HCL 25 MG PO TABS
50.0000 mg | ORAL_TABLET | Freq: Three times a day (TID) | ORAL | Status: DC
  Administered 2017-12-23 – 2017-12-25 (×7): 50 mg via ORAL
  Filled 2017-12-23 (×7): qty 2

## 2017-12-23 MED ORDER — CARVEDILOL 12.5 MG PO TABS
12.5000 mg | ORAL_TABLET | Freq: Two times a day (BID) | ORAL | Status: DC
  Administered 2017-12-23 – 2017-12-25 (×4): 12.5 mg via ORAL
  Filled 2017-12-23 (×4): qty 1

## 2017-12-23 MED ORDER — CARVEDILOL 3.125 MG PO TABS
6.2500 mg | ORAL_TABLET | Freq: Once | ORAL | Status: AC
  Administered 2017-12-23: 6.25 mg via ORAL
  Filled 2017-12-23: qty 2

## 2017-12-23 MED ORDER — FUROSEMIDE 10 MG/ML IJ SOLN
40.0000 mg | Freq: Once | INTRAMUSCULAR | Status: AC
  Administered 2017-12-23: 40 mg via INTRAVENOUS
  Filled 2017-12-23: qty 4

## 2017-12-23 MED ORDER — SODIUM CHLORIDE 0.9 % IV SOLN
Freq: Once | INTRAVENOUS | Status: AC
  Administered 2017-12-23: 16:00:00 via INTRAVENOUS

## 2017-12-23 NOTE — Progress Notes (Signed)
Pt was sitting up eating breakfast when she started c/o chest pain 10/10. I gave her 1 nitro at 833am and another at 840am along with an aspirin that was scheduled. Pain relieved in her chest completely after 2nd nitro but is now complaining of upper mid gastric pain. EKG now completed and provider reviewed and assessed pt. Adjustments to medicines have been made since pt will be medically managed for her condition.   Journee Kohen Rica Mote, RN

## 2017-12-23 NOTE — Progress Notes (Signed)
Patient Demographics:    [name], is a 84 y.o. female, DOB - 02-14-1932, QAS:[id number]  Admit date - 12/22/2017   Admitting Physician Arn Mcomber Denton Brick, MD  Outpatient Primary MD for the patient is Timmothy Euler, MD  LOS - 1   Chief Complaint  Patient presents with  . Chest Pain        Subjective:    [name] today has no fevers, complains of nausea, intermittent chest discomfort, relieved with nitro, no productive cough  Assessment  & Plan :    Principal Problem:   NSTEMI (non-ST elevated myocardial infarction) ([place]) Active Problems:   HTN (hypertension)   CAD S/P PCI '03 and '08   Anemia in chronic kidney disease   Insulin dependent diabetes mellitus with complications (HCC)   Chronic kidney disease, stage IV (severe) (HCC)   Chest pain   Hypertensive urgency   Brief Summary:- 83 year old female with history of known CAD, prior PCI in 2003 into thousand and 8, CKD stage IV-V and chronic anemia of CKD who was admitted on 12/22/2017 with chest pains and findings of NSTEMI.  Patient and family declined transfer to [facility] for possible angiography and revascularization due to concerns about renal complications and overall comorbid conditions.  Patient and family requested medical management   Plan:- 1)NSTEMI-  Troponin > 4.4, recurrent episodes of chest discomfort,  patient, 2 daughters and son-in-law, they declined transfer to [facility] for cardiology evaluation for possible left heart catheterization and possible angioplasty with stent placement.  In the past with similar situations patient has opted for medical management of ACS/CAD due to concerns about kidney failure.  Please note the patient had angioplasty and stent placement previously in 2003 in 2008, currently she does have CKD stage IV-V. c/n  IV heparin for ACS x 48 hrs, increase Coreg to 12.5 mg twice  daily, c/n  aspirin and Rosuvastatin, echo pending to evaluate wall motion abnormalities and EF  2)CKD stage IV-V-avoid nephrotoxic agents, creatinine is between 3.3-3.5  3)Anemia of CKD-patient was getting Procrit shots until recently, hemoglobin is down to 7.7, no evidence of ongoing bleeding, due to CAD/ACS will transfuse to keep hemoglobin above 9, to this end give 2 units of packed cells with Lasix in between on 12/23/2017 check stool for occult blood, watch for any bleeding. Risk, benefits and alternatives to transfusion of blood products discussed. Indication for transfusion discussed. Consent obtained  4)Social/Ethics-advanced directives and CODE STATUS discussed with patient, 2 daughters and son-in-law at bedside, RN Olean Ree at bedside as well, patient is a DNR/DNI, patient wants only medical management for ACS and STEMI  5)HTN-stage II hypertension, improved BP control, but BP still not at goal, increase Coreg to 12.5 mg twice daily, c/n  clonidine patch 0.3 mg q. Weekly, isosorbide 120 mg daily and hydralazine 50 mg 3 times daily,   may use IV labetalol 10 mg  Every 4 hours Prn for systolic blood pressure over 160 mmhg  6)Hypothyroidism-continue levothyroxine, check TSH  Code Status : DNR/DNI  Disposition Plan  : TBD  Consults  :  Cardiology pending   DVT Prophylaxis  :   Iv  Heparin    Lab Results  Component Value Date   PLT 173  12/23/2017    Inpatient Medications  Scheduled Meds: . aspirin EC  81 mg Oral Daily  . calcium carbonate  1 tablet Oral BID  . carvedilol  12.5 mg Oral BID WC  . cholecalciferol  1,000 Units Oral Daily  . cloNIDine  0.3 mg Transdermal Weekly  . feeding supplement (ENSURE ENLIVE)  237 mL Oral BID BM  . hydrALAZINE  50 mg Oral TID  . insulin NPH Human  10 Units Subcutaneous BID AC  . isosorbide mononitrate  120 mg Oral Daily  . levothyroxine  50 mcg Oral BH-[id number]  . pantoprazole  40 mg Oral BID  . rosuvastatin  10 mg Oral Daily  .  sucralfate  1 g Oral Q6H  . torsemide  20 mg Oral BID   Continuous Infusions: . sodium chloride    . heparin 700 Units/hr (12/22/17 2028)   PRN Meds:.acetaminophen, fentaNYL (SUBLIMAZE) injection, guaiFENesin-dextromethorphan, hydrALAZINE, HYDROcodone-acetaminophen, labetalol, nitroGLYCERIN, ondansetron (ZOFRAN) IV    Anti-infectives (From admission, onward)   Start     Dose/Rate Route Frequency Ordered Stop   12/22/17 1500  levofloxacin (LEVAQUIN) tablet 250 mg  Status:  Discontinued     250 mg Oral Every 48 hours 12/22/17 1301 12/22/17 1838        Objective:   Vitals:   12/23/17 0800 12/23/17 0900 12/23/17 1000 12/23/17 1100  BP: (!) 182/71 (!) 166/66 (!) 152/58 (!) 126/51  Pulse: 87 75 71 64  Resp: 13 13 15 14   Temp: 98.8 F (37.1 C)     TempSrc: Oral     SpO2: 97% 97% 98% 97%  Weight:      Height:        Wt Readings from Last 3 Encounters:  12/23/17 62.2 kg (137 lb 2 oz)  12/01/17 60.7 kg (133 lb 12.8 oz)  11/25/17 60.9 kg (134 lb 4.2 oz)     Intake/Output Summary (Last 24 hours) at 12/23/2017 1145 Last data filed at 12/23/2017 0930 Gross per 24 hour  Intake 676.73 ml  Output 300 ml  Net 376.73 ml     Physical Exam  Gen:- Awake Alert,  In no apparent distress  HEENT:- [place].AT, No sclera icterus Nose- [place] 2 L/min Neck-Supple Neck,No JVD,.  Lungs-  CTAB  CV- S1, S2 normal Abd-  +ve B.Sounds, Abd Soft, No tenderness,    Extremity/Skin:- No  edema,    Psych-affect is appropriate Neuro-no tremors, no new focal neuro deficits   Data Review:   Micro Results Recent Results (from the past 240 hour(s))  MRSA PCR Screening     Status: None   Collection Time: 12/22/17  7:55 PM  Result Value Ref Range Status   MRSA by PCR NEGATIVE NEGATIVE Final    Comment:        The GeneXpert MRSA Assay (FDA approved for NASAL specimens only), is one component of a comprehensive MRSA colonization surveillance program. It is not intended to diagnose MRSA infection nor  to guide or monitor treatment for MRSA infections.     Radiology Reports Dg Chest 2 View  Result Date: 11/24/2017 CLINICAL DATA:  Mid chest pain for 1 day EXAM: CHEST  2 VIEW COMPARISON:  10/15/2017 FINDINGS: Cardiac shadow is stable. Tortuosity of the aorta with calcification is noted but stable. The lungs are well aerated bilaterally. Minimal scarring is noted in the bases. Previously seen effusions have resolved. No new focal abnormality is noted. IMPRESSION: No active cardiopulmonary disease. Electronically Signed   By: Linus Mako.D.  On: 11/24/2017 12:27   Ct Head Wo Contrast  Result Date: 11/24/2017 CLINICAL DATA:  84 year old female with headache. EXAM: CT HEAD WITHOUT CONTRAST TECHNIQUE: Contiguous axial images were obtained from the base of the skull through the vertex without intravenous contrast. COMPARISON:  Head CT dated 10/15/2017 FINDINGS: Brain: There is mild age-related atrophy and chronic microvascular ischemic changes. No acute intracranial hemorrhage. No mass or midline shift. No extra-axial fluid collection. Vascular: No hyperdense vessel or unexpected calcification. Skull: Normal. Negative for fracture or focal lesion. Sinuses/Orbits: No acute finding. Other: None IMPRESSION: 1. No acute intracranial pathology. 2. Mild age-related atrophy and chronic microvascular ischemic changes. Electronically Signed   By: Anner Crete M.D.   On: 11/24/2017 23:47   Dg Chest Portable 1 View  Result Date: 12/22/2017 CLINICAL DATA:  Mid sternal chest pain EXAM: PORTABLE CHEST 1 VIEW COMPARISON:  11/24/2017 FINDINGS: Heart is borderline in size. Bibasilar opacities likely reflect atelectasis. No overt edema. No acute bony abnormality IMPRESSION: Bibasilar opacities, likely atelectasis. Electronically Signed   By: Rolm Baptise M.D.   On: 12/22/2017 07:52     CBC Recent Labs  Lab 12/22/17 0709 12/22/17 0733 12/23/17 0435  WBC  --  5.6 4.0  HGB 8.2* 8.8* 7.7*  HCT 24.0* 28.7*  26.4*  PLT  --  168 173  MCV  --  96.0 97.4  MCH  --  29.4 28.4  MCHC  --  30.7 29.2*  RDW  --  13.9 13.6  LYMPHSABS  --  0.8  --   MONOABS  --  0.4  --   EOSABS  --  0.1  --   BASOSABS  --  0.0  --     Chemistries  Recent Labs  Lab 12/22/17 0709 12/22/17 0733  NA 141 139  K 4.9 4.9  CL 110 106  CO2  --  21*  GLUCOSE 177* 190*  BUN 51* 58*  CREATININE 3.50* 3.30*  CALCIUM  --  9.5   ------------------------------------------------------------------------------------------------------------------ No results for input(s): CHOL, HDL, LDLCALC, TRIG, CHOLHDL, LDLDIRECT in the last 72 hours.  Lab Results  Component Value Date   HGBA1C 5.7 (H) 09/21/2017   ------------------------------------------------------------------------------------------------------------------ No results for input(s): TSH, T4TOTAL, T3FREE, THYROIDAB in the last 72 hours.  Invalid input(s): FREET3 ------------------------------------------------------------------------------------------------------------------ No results for input(s): VITAMINB12, FOLATE, FERRITIN, TIBC, IRON, RETICCTPCT in the last 72 hours.  Coagulation profile No results for input(s): INR, PROTIME in the last 168 hours.  No results for input(s): DDIMER in the last 72 hours.  Cardiac Enzymes Recent Labs  Lab 12/22/17 1116 12/22/17 1631 12/22/17 2225  TROPONINI 0.23* 2.32* 4.47*   ------------------------------------------------------------------------------------------------------------------    Component Value Date/Time   BNP 1,243.0 (H) 09/20/2017 1940     Shironda Kain Denton Brick M.D on 12/23/2017 at 11:45 AM  Between 7am to 7pm - Pager - [phone]  After 7pm go to [email or web address] - password [email or web address]  [facility] -  Office  [phone]   Voice Recognition Viviann Spare dictation system was used to create this note, attempts have been made to correct errors. Please contact the author with questions and/or  clarifications.

## 2017-12-23 NOTE — Progress Notes (Signed)
[place] for HEPARIN Indication: chest pain/ACS  Allergies  Allergen Reactions  . Propoxyphene N-Acetaminophen     Pt does not know reaction  . Simvastatin Other (See Comments)    headache   Patient Measurements: Height: 5\' 3"  (160 cm) Weight: 137 lb 2 oz (62.2 kg) IBW/kg (Calculated) : 52.4 HEPARIN DW (KG): 60.3  Vital Signs: Temp: 98.7 F (37.1 C) (01/26 0400) Temp Source: Oral (01/26 0400) BP: 170/67 (01/26 0600) Pulse Rate: 78 (01/26 0600)  Labs: Recent Labs    12/22/17 0709 12/22/17 0733 12/22/17 1116 12/22/17 1631 12/22/17 2225 12/23/17 0434 12/23/17 0435  HGB 8.2* 8.8*  --   --   --   --  7.7*  HCT 24.0* 28.7*  --   --   --   --  26.4*  PLT  --  168  --   --   --   --  173  HEPARINUNFRC  --   --   --   --   --  0.34  --   CREATININE 3.50* 3.30*  --   --   --   --   --   TROPONINI  --   --  0.23* 2.32* 4.47*  --   --    Estimated Creatinine Clearance: 10.7 mL/min (A) (by C-G formula based on SCr of 3.3 mg/dL (H)).  Medical History: Past Medical History:  Diagnosis Date  . CAD (coronary artery disease) 2003   Last catheterization 2008. Two-vessel PCI of the first OM branch and mid LAD.  Marland Kitchen CKD (chronic kidney disease)   . Congestive heart failure ([place]) 02/2017   grade 2 diastolic dysfunction  . CVA (cerebral vascular accident) ([place]) [place]   . Dementia   . DJD (degenerative joint disease) of lumbar spine   . Hypercholesteremia   . Hypertension   . IDDM (insulin dependent diabetes mellitus) ([place])    Medications:  Medications Prior to Admission  Medication Sig Dispense Refill Last Dose  . aspirin EC 81 MG EC tablet Take 1 tablet (81 mg total) by mouth daily. 30 tablet 11 12/22/2017  . carvedilol (COREG) 6.25 MG tablet Take 1 tablet (6.25 mg total) by mouth 2 (two) times daily with a meal. 60 tablet 0 12/22/2017  . cholecalciferol (VITAMIN D) 1000 units tablet Take 1,000 Units by mouth daily.   12/22/2017   . cloNIDine (CATAPRES) 0.1 MG tablet TAKE (1) TABLET TWICE A DAY. 180 tablet 1 12/22/2017  . diclofenac sodium (VOLTAREN) 1 % GEL Apply 4 g topically 4 (four) times daily. (Patient taking differently: Apply 4 g topically 4 (four) times daily as needed (FOR PAIN). ) 100 g 2 12/22/2017  . hydrALAZINE (APRESOLINE) 50 MG tablet Take 1 tablet (50 mg total) by mouth 2 (two) times daily. (Patient taking differently: Take 50 mg by mouth daily. ) 30 tablet 0 12/22/2017  . HYDROcodone-acetaminophen (NORCO) 5-325 MG tablet Take 0.5-1 tablets every 6 (six) hours as needed by mouth for moderate pain. 30 tablet 0 12/22/2017  . insulin NPH Human (HUMULIN N) 100 UNIT/ML injection Inject 0.1 mLs (10 Units total) into the skin 2 (two) times daily before a meal. (Patient taking differently: Inject 10 Units into the skin 2 (two) times daily before a meal. GIVES BASED ON BLOOD SUGAR LEVELS. IF UNDER 120-NO INSULIN IS GIVEN) 10 mL 1 12/22/2017  . isosorbide mononitrate (IMDUR) 120 MG 24 hr tablet Take 1 tablet (120 mg total) by mouth daily.  30 tablet 0 12/22/2017  . levothyroxine (SYNTHROID, LEVOTHROID) 50 MCG tablet TAKE 1 TABLET DAILY 90 tablet 3 12/22/2017  . nitroGLYCERIN (NITROSTAT) 0.4 MG SL tablet PLACE 1 TABLET UNDER THE TONGUE AT ONSET OF CHEST PAIN EVERY 5 MINTUES UP TO 3 TIMES AS NEEDED 25 tablet 1 12/22/2017  . pantoprazole (PROTONIX) 40 MG tablet Take 1 tablet (40 mg total) by mouth 2 (two) times daily. X one month, then once daily (Patient taking differently: Take 40 mg by mouth daily. X one month, then once daily) 60 tablet 0 12/22/2017  . polyethylene glycol powder (GLYCOLAX/MIRALAX) powder MIX 1 CAPFUL (17 GRAMS) WITH 8OZ OF WATER OR JUICE DAILY. (Patient taking differently: MIX 1 CAPFUL (17 GRAMS) WITH 8OZ OF WATER OR JUICE DAILY AS NEEDED FOR CONSTIPATION) 255 g 5 12/22/2017  . rosuvastatin (CRESTOR) 10 MG tablet TAKE 1 TABLET DAILY 30 tablet 0 12/22/2017  . sodium polystyrene (KAYEXALATE) 15 GM/60ML suspension Take  60 mLs (15 g total) by mouth daily. (Patient taking differently: Take 15 g by mouth daily as needed. ) 500 mL 2 12/22/2017  . sucralfate (CARAFATE) 1 GM/10ML suspension Take 10 mLs (1 g total) by mouth every 6 (six) hours. (Patient taking differently: Take 1 g by mouth daily as needed. ) 420 mL 0 12/22/2017  . torsemide (DEMADEX) 20 MG tablet Take 1 tablet (20 mg total) by mouth 2 (two) times daily. 60 tablet 1 unknown   Assessment: 84yo female with h/o cardiac dz.  Pharmacy asked to initiate Heparin for ACS.  Heparin level is at goal.    Goal of Therapy:  Heparin level 0.3-0.7 units/ml Monitor platelets by anticoagulation protocol: Yes   Plan:   Heparin infusion at 700 units/hr  Heparin level daily  CBC daily while on Heparin  [place] Crane, Ellanore Vanhook A 12/23/2017,9:03 AM

## 2017-12-23 NOTE — Progress Notes (Addendum)
Pt is alert and oriented x 4. Dr communicated to pt family member over the phone for the need of a blood transfusion. Pt was educated by nurse and RN is trying to get blood transfusion consent but patient is refusing at this time. Pt will talk to family again about and discuss it over lunch and RN to revisit the situation. At this time pt states that she has had 3 units over the past year and it has not helped her nor made her feel any better. She also states she doesn't know how we are going to get our money for all this blood. Rn will continue to check on pt and educate towards medical advice. Pt is aware of her right to refuse.   Ericka Pontiff, RN   MD came by to talk to patient directly and to family member. Patient seemed to understand what was being told to her, she repeated it back if asked what was said but there seems to be a faint concern of whether there is some forgetfulness from pt but no confusion is apparent currently. Pt states "if it's my time to go then God can take me home and I'm ready to meet my maker".She is still adamant about not receiving blood even when she has been told by the doctor that her body truly needs it and without could increase her risk of having a bigger heart attack. We will revisit the situation again after while after family and patient has had time to discuss further treatments and pathways for medical regimen.  Yuvia Plant Rica Mote, RN 12:46 PM

## 2017-12-23 NOTE — Progress Notes (Signed)
Patient has had conversations with other family members and has agreed to take the blood transfusion. She states it is her choice and nobody forced her into the decision but she just needed time to think about it but now she is ready and still wants to be medically treated. She also states that she is ready to go meet her savior when he calls her name she just wants it to be known she will be ready. She understands that she really does need the blood right now. Signed blood consent in chart.  Brookie Wayment Rica Mote, RN 1:52 PM

## 2017-12-24 ENCOUNTER — Inpatient Hospital Stay (HOSPITAL_COMMUNITY): Payer: Medicare Other

## 2017-12-24 DIAGNOSIS — I340 Nonrheumatic mitral (valve) insufficiency: Secondary | ICD-10-CM

## 2017-12-24 LAB — GLUCOSE, CAPILLARY
Glucose-Capillary: 70 mg/dL (ref 65–99)
Glucose-Capillary: 81 mg/dL (ref 65–99)
Glucose-Capillary: 89 mg/dL (ref 65–99)
Glucose-Capillary: 96 mg/dL (ref 65–99)

## 2017-12-24 LAB — CBC
HEMATOCRIT: 30.4 % — AB (ref 36.0–46.0)
HEMOGLOBIN: 9.7 g/dL — AB (ref 12.0–15.0)
MCH: 29.7 pg (ref 26.0–34.0)
MCHC: 31.9 g/dL (ref 30.0–36.0)
MCV: 93 fL (ref 78.0–100.0)
Platelets: 130 10*3/uL — ABNORMAL LOW (ref 150–400)
RBC: 3.27 MIL/uL — ABNORMAL LOW (ref 3.87–5.11)
RDW: 14.9 % (ref 11.5–15.5)
WBC: 3.3 10*3/uL — AB (ref 4.0–10.5)

## 2017-12-24 LAB — BASIC METABOLIC PANEL
ANION GAP: 10 (ref 5–15)
BUN: 59 mg/dL — ABNORMAL HIGH (ref 6–20)
CALCIUM: 9.8 mg/dL (ref 8.9–10.3)
CO2: 24 mmol/L (ref 22–32)
Chloride: 107 mmol/L (ref 101–111)
Creatinine, Ser: 3.58 mg/dL — ABNORMAL HIGH (ref 0.44–1.00)
GFR calc Af Amer: 13 mL/min — ABNORMAL LOW (ref >60)
GFR calc non Af Amer: 11 mL/min — ABNORMAL LOW (ref >60)
GLUCOSE: 60 mg/dL — AB (ref 65–99)
Potassium: 4.8 mmol/L (ref 3.5–5.1)
Sodium: 141 mmol/L (ref 135–145)

## 2017-12-24 LAB — ECHOCARDIOGRAM COMPLETE
Height: 63 in
Weight: 2183.44 oz

## 2017-12-24 LAB — HEMOGLOBIN A1C
Hgb A1c MFr Bld: 6.6 % — ABNORMAL HIGH (ref 4.8–5.6)
Mean Plasma Glucose: 142.72 mg/dL

## 2017-12-24 LAB — HEPARIN LEVEL (UNFRACTIONATED): Heparin Unfractionated: 0.12 IU/mL — ABNORMAL LOW (ref 0.30–0.70)

## 2017-12-24 LAB — TSH: TSH: 3.793 u[IU]/mL (ref 0.350–4.500)

## 2017-12-24 MED ORDER — HEPARIN SODIUM (PORCINE) 5000 UNIT/ML IJ SOLN
5000.0000 [IU] | Freq: Three times a day (TID) | INTRAMUSCULAR | Status: DC
  Administered 2017-12-25: 5000 [IU] via SUBCUTANEOUS
  Filled 2017-12-24: qty 1

## 2017-12-24 MED ORDER — INSULIN ASPART 100 UNIT/ML ~~LOC~~ SOLN
0.0000 [IU] | Freq: Three times a day (TID) | SUBCUTANEOUS | Status: DC
  Administered 2017-12-25: 2 [IU] via SUBCUTANEOUS

## 2017-12-24 NOTE — Progress Notes (Signed)
[place] for HEPARIN Indication: chest pain/ACS  Allergies  Allergen Reactions  . Propoxyphene N-Acetaminophen     Pt does not know reaction  . Simvastatin Other (See Comments)    headache   Patient Measurements: Height: 5\' 3"  (160 cm) Weight: 136 lb 7.4 oz (61.9 kg) IBW/kg (Calculated) : 52.4 HEPARIN DW (KG): 60.3  Vital Signs: Temp: 99.4 F (37.4 C) (01/27 0400) Temp Source: Oral (01/27 0400) BP: 129/50 (01/27 0600) Pulse Rate: 53 (01/27 0600)  Labs: Recent Labs    12/22/17 0709 12/22/17 0733 12/22/17 1116 12/22/17 1631 12/22/17 2225 12/23/17 0434 12/23/17 0435 12/24/17 0433  HGB 8.2* 8.8*  --   --   --   --  7.7* 9.7*  HCT 24.0* 28.7*  --   --   --   --  26.4* 30.4*  PLT  --  168  --   --   --   --  173 130*  HEPARINUNFRC  --   --   --   --   --  0.34  --  0.12*  CREATININE 3.50* 3.30*  --   --   --   --   --  3.58*  TROPONINI  --   --  0.23* 2.32* 4.47*  --   --   --    Estimated Creatinine Clearance: 9.8 mL/min (A) (by C-G formula based on SCr of 3.58 mg/dL (H)).  Medical History: Past Medical History:  Diagnosis Date  . CAD (coronary artery disease) 2003   Last catheterization 2008. Two-vessel PCI of the first OM branch and mid LAD.  Marland Kitchen CKD (chronic kidney disease)   . Congestive heart failure ([place]) 02/2017   grade 2 diastolic dysfunction  . CVA (cerebral vascular accident) ([place]) [place]   . Dementia   . DJD (degenerative joint disease) of lumbar spine   . Hypercholesteremia   . Hypertension   . IDDM (insulin dependent diabetes mellitus) ([place])    Medications:  Medications Prior to Admission  Medication Sig Dispense Refill Last Dose  . aspirin EC 81 MG EC tablet Take 1 tablet (81 mg total) by mouth daily. 30 tablet 11 12/22/2017  . carvedilol (COREG) 6.25 MG tablet Take 1 tablet (6.25 mg total) by mouth 2 (two) times daily with a meal. 60 tablet 0 12/22/2017  . cholecalciferol (VITAMIN D) 1000 units  tablet Take 1,000 Units by mouth daily.   12/22/2017  . cloNIDine (CATAPRES) 0.1 MG tablet TAKE (1) TABLET TWICE A DAY. 180 tablet 1 12/22/2017  . diclofenac sodium (VOLTAREN) 1 % GEL Apply 4 g topically 4 (four) times daily. (Patient taking differently: Apply 4 g topically 4 (four) times daily as needed (FOR PAIN). ) 100 g 2 12/22/2017  . hydrALAZINE (APRESOLINE) 50 MG tablet Take 1 tablet (50 mg total) by mouth 2 (two) times daily. (Patient taking differently: Take 50 mg by mouth daily. ) 30 tablet 0 12/22/2017  . HYDROcodone-acetaminophen (NORCO) 5-325 MG tablet Take 0.5-1 tablets every 6 (six) hours as needed by mouth for moderate pain. 30 tablet 0 12/22/2017  . insulin NPH Human (HUMULIN N) 100 UNIT/ML injection Inject 0.1 mLs (10 Units total) into the skin 2 (two) times daily before a meal. (Patient taking differently: Inject 10 Units into the skin 2 (two) times daily before a meal. GIVES BASED ON BLOOD SUGAR LEVELS. IF UNDER 120-NO INSULIN IS GIVEN) 10 mL 1 12/22/2017  . isosorbide mononitrate (IMDUR) 120 MG 24 hr  tablet Take 1 tablet (120 mg total) by mouth daily. 30 tablet 0 12/22/2017  . levothyroxine (SYNTHROID, LEVOTHROID) 50 MCG tablet TAKE 1 TABLET DAILY 90 tablet 3 12/22/2017  . nitroGLYCERIN (NITROSTAT) 0.4 MG SL tablet PLACE 1 TABLET UNDER THE TONGUE AT ONSET OF CHEST PAIN EVERY 5 MINTUES UP TO 3 TIMES AS NEEDED 25 tablet 1 12/22/2017  . pantoprazole (PROTONIX) 40 MG tablet Take 1 tablet (40 mg total) by mouth 2 (two) times daily. X one month, then once daily (Patient taking differently: Take 40 mg by mouth daily. X one month, then once daily) 60 tablet 0 12/22/2017  . polyethylene glycol powder (GLYCOLAX/MIRALAX) powder MIX 1 CAPFUL (17 GRAMS) WITH 8OZ OF WATER OR JUICE DAILY. (Patient taking differently: MIX 1 CAPFUL (17 GRAMS) WITH 8OZ OF WATER OR JUICE DAILY AS NEEDED FOR CONSTIPATION) 255 g 5 12/22/2017  . rosuvastatin (CRESTOR) 10 MG tablet TAKE 1 TABLET DAILY 30 tablet 0 12/22/2017  . sodium  polystyrene (KAYEXALATE) 15 GM/60ML suspension Take 60 mLs (15 g total) by mouth daily. (Patient taking differently: Take 15 g by mouth daily as needed. ) 500 mL 2 12/22/2017  . sucralfate (CARAFATE) 1 GM/10ML suspension Take 10 mLs (1 g total) by mouth every 6 (six) hours. (Patient taking differently: Take 1 g by mouth daily as needed. ) 420 mL 0 12/22/2017  . torsemide (DEMADEX) 20 MG tablet Take 1 tablet (20 mg total) by mouth 2 (two) times daily. 60 tablet 1 unknown   Assessment: 84yo female with h/o cardiac dz.  Pharmacy asked to initiate Heparin for ACS.  Heparin level is below goal today.   Goal of Therapy:  Heparin level 0.3-0.7 units/ml Monitor platelets by anticoagulation protocol: Yes   Plan:   IncreaseHeparin infusion to 1000 units/hr  Heparin level in 6-8 hours then daily  CBC daily while on Heparin  [place] Crane, Ami Mally A 12/24/2017,8:20 AM

## 2017-12-24 NOTE — Progress Notes (Signed)
  Echocardiogram 2D Echocardiogram has been performed.  Alexyia Guarino L Androw 12/24/2017, 8:33 AM

## 2017-12-24 NOTE — Progress Notes (Addendum)
Patient Demographics:    [name], is a 84 y.o. female, DOB - 18-Nov-1932, ALP:[id number]  Admit date - 12/22/2017   Admitting Physician Shelby Loughry Denton Brick, MD  Outpatient Primary MD for the patient is Shelby Euler, MD  LOS - 2   Chief Complaint  Patient presents with  . Chest Pain        Subjective:    [name] today has no fevers, complains of nausea, no further chest pains, eating and drinking well, patient's daughter [name]) at bedside, patient continues to be intermittently forgetful  Assessment  & Plan :    Principal Problem:   NSTEMI (non-ST elevated myocardial infarction) [facility]) Active Problems:   HTN (hypertension)   CAD S/P PCI '03 and '08   Anemia in chronic kidney disease   Insulin dependent diabetes mellitus with complications (HCC)   Chronic kidney disease, stage IV (severe) (HCC)   Chest pain   Hypertensive urgency   Brief Summary:- 84 year old female with history of known CAD, prior PCI in 2003 into thousand and 2008, CKD stage IV-V and chronic anemia of CKD who was admitted on 12/22/2017 with chest pains and findings of NSTEMI.  Patient and family declined transfer to [facility] for possible angiography and revascularization due to concerns about renal complications and overall comorbid conditions.  Patient and family requested medical management here at [place]:- 1)NSTEMI-  Troponin was > 4.4, no further chest pains, patient, her 2 daughters and son-in-law declined transfer to [facility] for cardiology evaluation for possible left heart catheterization and possible angioplasty with stent placement.  In the past with similar situations patient has opted for medical management of ACS/CAD partly due to concerns about potential kidney failure with LHC.   Please note the patient had angioplasty and stent placement previously in 2003 in 2008,  currently she does have CKD stage IV-V.   We will stop IV heparin after  48 hrs (12/24/17 at 2100), continue Coreg to 12.5 mg twice daily, c/n  aspirin and Rosuvastatin, echo pending to evaluate wall motion abnormalities and EF, cardiology consult on 12/25/2017, ???  If she needs Plavix and/or Ranexa added to her regimen, we defer decision to cardiologist  2)CKD stage IV-V-avoid nephrotoxic agents, creatinine is stable between 3.3-3.5  3)Anemia of CKD-patient was getting Procrit shots until recently, hemoglobin is up to 9.7 from  7.7 after transfusion of 2 units of packed cells on 12/23/2017.  no evidence of ongoing bleeding, due to CAD/ACS will  Try to  keep hemoglobin above 9, check stool for occult blood.  4)Social/Ethics-advanced directives and CODE STATUS discussed with patient, 2 daughters and son-in-law , patient is a DNR/DNI, patient wants only medical management for ACS and STEMI.  Patient does not have a designated POA  5)HTN-stage II hypertension, improved BP control, c/n Coreg  12.5 mg twice daily, c/n  clonidine patch 0.3 mg q. Weekly, isosorbide 120 mg daily and hydralazine 50 mg 3 times daily,   may use IV labetalol 10 mg  Every 4 hours Prn for systolic blood pressure over 160 mmhg  6)Hypothyroidism-continue levothyroxine,  TSH pending  7)NeuroPsych-despite being alert and oriented, patient does have some cognitive deficits and memory deficits as well, patient's daughters have been  helping her with decision-making,   8)DM-continue NPH insulin 10 units twice a day along with sliding scale coverage, A1c was 5.7 in October 2018, given age and rather low A1c we will  avoid over-aggressive diabetic control in order to reduce possible life-threatening hypoglycemic events  9)Disposition Plan  : home when ok with cardiologist, may need home health RN for medication compliance and cardiopulmonary assessment, please note medication changes upon discharge home, Coreg dosage has been increased  to 12.5 twice daily, also please note that for improved compliance clonidine patch will work better for patient rather than clonidine pills, patient will also need to get Procrit shots as outpatient for her chronic anemia of CKD. Plan of care d/w Daughter (Ms [name])  Code Status : DNR/DNI  Consults  :  Cardiology pending on 12/25/17  DVT Prophylaxis  : Start subcu heparin once IV heparin is discontinued     Lab Results  Component Value Date   PLT 130 (L) 12/24/2017   Lab Results  Component Value Date   HGBA1C 5.7 (H) 09/21/2017     Inpatient Medications  Scheduled Meds: . aspirin EC  81 mg Oral Daily  . calcium carbonate  1 tablet Oral BID  . carvedilol  12.5 mg Oral BID WC  . cholecalciferol  1,000 Units Oral Daily  . cloNIDine  0.3 mg Transdermal Weekly  . feeding supplement (ENSURE ENLIVE)  237 mL Oral BID BM  . hydrALAZINE  50 mg Oral TID  . insulin NPH Human  10 Units Subcutaneous BID AC  . isosorbide mononitrate  120 mg Oral Daily  . levothyroxine  50 mcg Oral BH-q7a  . pantoprazole  40 mg Oral BID  . rosuvastatin  10 mg Oral Daily  . sucralfate  1 g Oral Q6H  . torsemide  20 mg Oral BID   Continuous Infusions: . heparin 1,000 Units/hr (12/24/17 0834)   PRN Meds:.acetaminophen, fentaNYL (SUBLIMAZE) injection, guaiFENesin-dextromethorphan, hydrALAZINE, HYDROcodone-acetaminophen, labetalol, nitroGLYCERIN, ondansetron (ZOFRAN) IV    Anti-infectives (From admission, onward)   Start     Dose/Rate Route Frequency Ordered Stop   12/22/17 1500  levofloxacin (LEVAQUIN) tablet 250 mg  Status:  Discontinued     250 mg Oral Every 48 hours 12/22/17 1301 12/22/17 1838        Objective:   Vitals:   12/24/17 0700 12/24/17 0800 12/24/17 0829 12/24/17 0922  BP: (!) 151/48 (!) 130/100  (!) 141/91  Pulse: (!) 57 60    Resp: 13 18    Temp:   98 F (36.7 C)   TempSrc:   Oral   SpO2: 97% 99%    Weight:      Height:        Wt Readings from Last 3 Encounters:    12/24/17 61.9 kg (136 lb 7.4 oz)  12/01/17 60.7 kg (133 lb 12.8 oz)  11/25/17 60.9 kg (134 lb 4.2 oz)     Intake/Output Summary (Last 24 hours) at 12/24/2017 1215 Last data filed at 12/24/2017 0800 Gross per 24 hour  Intake 932 ml  Output 825 ml  Net 107 ml     Physical Exam  Gen:- Awake Alert,  In no apparent distress  HEENT:- [place].AT, No sclera icterus Nose- [place] 2 L/min Neck-Supple Neck,No JVD,.  Lungs-  CTAB  CV- S1, S2 normal Abd-  +ve B.Sounds, Abd Soft, No tenderness,    Extremity/Skin:- No  edema,    Psych-affect is appropriate, forgetful at times with some cognitive deficits Neuro-no tremors, no new  focal neuro deficits   Data Review:   Micro Results Recent Results (from the past 240 hour(s))  MRSA PCR Screening     Status: None   Collection Time: 12/22/17  7:55 PM  Result Value Ref Range Status   MRSA by PCR NEGATIVE NEGATIVE Final    Comment:        The GeneXpert MRSA Assay (FDA approved for NASAL specimens only), is one component of a comprehensive MRSA colonization surveillance program. It is not intended to diagnose MRSA infection nor to guide or monitor treatment for MRSA infections.     Radiology Reports Dg Chest 2 View  Result Date: 11/24/2017 CLINICAL DATA:  Mid chest pain for 1 day EXAM: CHEST  2 VIEW COMPARISON:  10/15/2017 FINDINGS: Cardiac shadow is stable. Tortuosity of the aorta with calcification is noted but stable. The lungs are well aerated bilaterally. Minimal scarring is noted in the bases. Previously seen effusions have resolved. No new focal abnormality is noted. IMPRESSION: No active cardiopulmonary disease. Electronically Signed   By: Inez Catalina M.D.   On: 11/24/2017 12:27   Ct Head Wo Contrast  Result Date: 11/24/2017 CLINICAL DATA:  84 year old female with headache. EXAM: CT HEAD WITHOUT CONTRAST TECHNIQUE: Contiguous axial images were obtained from the base of the skull through the vertex without intravenous contrast.  COMPARISON:  Head CT dated 10/15/2017 FINDINGS: Brain: There is mild age-related atrophy and chronic microvascular ischemic changes. No acute intracranial hemorrhage. No mass or midline shift. No extra-axial fluid collection. Vascular: No hyperdense vessel or unexpected calcification. Skull: Normal. Negative for fracture or focal lesion. Sinuses/Orbits: No acute finding. Other: None IMPRESSION: 1. No acute intracranial pathology. 2. Mild age-related atrophy and chronic microvascular ischemic changes. Electronically Signed   By: Anner Crete M.D.   On: 11/24/2017 23:47   Dg Chest Portable 1 View  Result Date: 12/22/2017 CLINICAL DATA:  Mid sternal chest pain EXAM: PORTABLE CHEST 1 VIEW COMPARISON:  11/24/2017 FINDINGS: Heart is borderline in size. Bibasilar opacities likely reflect atelectasis. No overt edema. No acute bony abnormality IMPRESSION: Bibasilar opacities, likely atelectasis. Electronically Signed   By: Rolm Baptise M.D.   On: 12/22/2017 07:52     CBC Recent Labs  Lab 12/22/17 0709 12/22/17 0733 12/23/17 0435 12/24/17 0433  WBC  --  5.6 4.0 3.3*  HGB 8.2* 8.8* 7.7* 9.7*  HCT 24.0* 28.7* 26.4* 30.4*  PLT  --  168 173 130*  MCV  --  96.0 97.4 93.0  MCH  --  29.4 28.4 29.7  MCHC  --  30.7 29.2* 31.9  RDW  --  13.9 13.6 14.9  LYMPHSABS  --  0.8  --   --   MONOABS  --  0.4  --   --   EOSABS  --  0.1  --   --   BASOSABS  --  0.0  --   --     Chemistries  Recent Labs  Lab 12/22/17 0709 12/22/17 0733 12/24/17 0433  NA 141 139 141  K 4.9 4.9 4.8  CL 110 106 107  CO2  --  21* 24  GLUCOSE 177* 190* 60*  BUN 51* 58* 59*  CREATININE 3.50* 3.30* 3.58*  CALCIUM  --  9.5 9.8   ------------------------------------------------------------------------------------------------------------------ No results for input(s): CHOL, HDL, LDLCALC, TRIG, CHOLHDL, LDLDIRECT in the last 72 hours.  Lab Results  Component Value Date   HGBA1C 5.7 (H) 09/21/2017    ------------------------------------------------------------------------------------------------------------------ No results for input(s): TSH, T4TOTAL, T3FREE, THYROIDAB in  the last 72 hours.  Invalid input(s): FREET3 ------------------------------------------------------------------------------------------------------------------ No results for input(s): VITAMINB12, FOLATE, FERRITIN, TIBC, IRON, RETICCTPCT in the last 72 hours.  Coagulation profile No results for input(s): INR, PROTIME in the last 168 hours.  No results for input(s): DDIMER in the last 72 hours.  Cardiac Enzymes Recent Labs  Lab 12/22/17 1116 12/22/17 1631 12/22/17 2225  TROPONINI 0.23* 2.32* 4.47*   ------------------------------------------------------------------------------------------------------------------    Component Value Date/Time   BNP 1,243.0 (H) 09/20/2017 1940     Javoris Star Denton [name] M.D on 12/24/2017 at 12:15 PM  Between 7am to 7pm - Pager - [phone]  After 7pm go to [email or web address] - password [email or web address]  [facility] -  Office  [phone]   Voice Recognition Viviann Spare dictation system was used to create this note, attempts have been made to correct errors. Please contact the author with questions and/or clarifications.

## 2017-12-25 DIAGNOSIS — D631 Anemia in chronic kidney disease: Secondary | ICD-10-CM

## 2017-12-25 DIAGNOSIS — N184 Chronic kidney disease, stage 4 (severe): Secondary | ICD-10-CM

## 2017-12-25 DIAGNOSIS — I214 Non-ST elevation (NSTEMI) myocardial infarction: Principal | ICD-10-CM

## 2017-12-25 DIAGNOSIS — I160 Hypertensive urgency: Secondary | ICD-10-CM

## 2017-12-25 DIAGNOSIS — Z794 Long term (current) use of insulin: Secondary | ICD-10-CM

## 2017-12-25 DIAGNOSIS — E118 Type 2 diabetes mellitus with unspecified complications: Secondary | ICD-10-CM

## 2017-12-25 LAB — CBC
HEMATOCRIT: 28.9 % — AB (ref 36.0–46.0)
Hemoglobin: 9 g/dL — ABNORMAL LOW (ref 12.0–15.0)
MCH: 29.1 pg (ref 26.0–34.0)
MCHC: 31.1 g/dL (ref 30.0–36.0)
MCV: 93.5 fL (ref 78.0–100.0)
PLATELETS: 122 10*3/uL — AB (ref 150–400)
RBC: 3.09 MIL/uL — ABNORMAL LOW (ref 3.87–5.11)
RDW: 14.5 % (ref 11.5–15.5)
WBC: 3 10*3/uL — ABNORMAL LOW (ref 4.0–10.5)

## 2017-12-25 LAB — BPAM RBC
BLOOD PRODUCT EXPIRATION DATE: 201902162359
Blood Product Expiration Date: 201902162359
ISSUE DATE / TIME: 201901261628
ISSUE DATE / TIME: 201901262036
UNIT TYPE AND RH: 6200
Unit Type and Rh: 6200

## 2017-12-25 LAB — TYPE AND SCREEN
ABO/RH(D): A POS
Antibody Screen: NEGATIVE
Unit division: 0
Unit division: 0

## 2017-12-25 LAB — BASIC METABOLIC PANEL
ANION GAP: 11 (ref 5–15)
BUN: 65 mg/dL — AB (ref 6–20)
CALCIUM: 9.5 mg/dL (ref 8.9–10.3)
CO2: 24 mmol/L (ref 22–32)
Chloride: 103 mmol/L (ref 101–111)
Creatinine, Ser: 3.88 mg/dL — ABNORMAL HIGH (ref 0.44–1.00)
GFR calc Af Amer: 11 mL/min — ABNORMAL LOW (ref >60)
GFR, EST NON AFRICAN AMERICAN: 10 mL/min — AB (ref >60)
GLUCOSE: 94 mg/dL (ref 65–99)
Potassium: 4.9 mmol/L (ref 3.5–5.1)
Sodium: 138 mmol/L (ref 135–145)

## 2017-12-25 LAB — GLUCOSE, CAPILLARY
Glucose-Capillary: 116 mg/dL — ABNORMAL HIGH (ref 65–99)
Glucose-Capillary: 158 mg/dL — ABNORMAL HIGH (ref 65–99)

## 2017-12-25 MED ORDER — CLOPIDOGREL BISULFATE 75 MG PO TABS: 75.0000 mg | ORAL_TABLET | Freq: Every day | ORAL | 0 refills | Status: DC

## 2017-12-25 MED ORDER — CLOPIDOGREL BISULFATE 75 MG PO TABS
75.0000 mg | ORAL_TABLET | Freq: Every day | ORAL | Status: DC
  Administered 2017-12-25: 75 mg via ORAL
  Filled 2017-12-25: qty 1

## 2017-12-25 MED ORDER — CLONIDINE 0.3 MG/24HR TD PTWK: 0.3000 mg | MEDICATED_PATCH | TRANSDERMAL | 0 refills | Status: DC

## 2017-12-25 MED ORDER — CARVEDILOL 6.25 MG PO TABS: 12.5000 mg | ORAL_TABLET | Freq: Two times a day (BID) | ORAL | 0 refills | Status: DC

## 2017-12-25 MED ORDER — ENSURE ENLIVE PO LIQD: 237.0000 mL | Freq: Two times a day (BID) | ORAL | 12 refills | Status: DC

## 2017-12-25 NOTE — Care Management Important Message (Signed)
Important Message  Patient Details  Name: [name] MRN: [id number] Date of Birth: February 10, 1932   Medicare Important Message Given:  Yes    Martena Emanuele, Chauncey Reading, RN 12/25/2017, 2:17 PM

## 2017-12-25 NOTE — Discharge Summary (Signed)
Physician Discharge Summary  MYRACLE FEBRES TDD:[id number] DOB: 1932-01-20 DOA: 12/22/2017  PCP: Timmothy Euler, MD  Admit date: 12/22/2017 Discharge date: 12/25/2017  Admitted From: Home Disposition: Home  Recommendations for Outpatient Follow-up:  1. Follow up with PCP in 1-2 weeks 2. Follow-up with cardiology in 3-4 weeks (office will arrange appointment) 3. Follow-up with her nephrologist in 2 weeks.  Home Health: Arrange home health based on PT evaluation Equipment/Devices: Has a rolling walker at home  Discharge Condition: Fair CODE STATUS: DO NOT RESUSCITATE Diet recommendation: Heart Healthy / Carb Modified     Discharge Diagnoses:  Principal Problem:   NSTEMI (non-ST elevated myocardial infarction) ([place])  Active Problems:  Hypertensive urgency   CAD S/P PCI '03 and '08   Anemia in chronic kidney disease   Insulin dependent diabetes mellitus with complications (HCC)   Chronic kidney disease, stage IV (severe) ([place])   Brief narrative/history of present illness 84 year old female with history of known CAD, prior PCI in 2003 into thousand and 2008, CKD stage IV-V and chronic anemia of CKD who was admitted on 12/22/2017 with chest pains and findings of NSTEMI.  Patient and family declined transfer to [facility] for possible angiography and revascularization due to concerns about renal complications and overall comorbid conditions.    Hospital course: Non ST elevation MI ([place]) -Troponin  peaked at 4.4., no further chest pains. patient, her 2 daughters and son-in-law declined transfer to [facility] for cardiology evaluation for possible left heart catheterization and possible angioplasty with stent placement due to her advanced renal disease. 2-D echo with normal EF of 60-65%, normal wall motion abnormality with grade 2 diastolic dysfunction. Patient received IV heparin for 48 hours. Cardiology consult appreciated. Recommend to continue aspirin,  statin and added Plavix. Increased carvedilol dose. Cardiology will arrange outpatient follow-up.   CKDstage IV-V creatine stable at baseline (3.5) . Follow-up with her nephrologist as outpatient.  Anemia of CKD patient was getting Procritshots untilrecently, hemoglobin is up to 9.7 from  7.7 after transfusion of 2 units of packed cells on 12/23/2017.  no evidence of ongoing bleeding. Recommend keeping hemoglobin >9.    Essential hypertension Increased carvedilol dose to 12.5 mg twice a day. Switched clonidine to clonidine patch once weekly. Continue Imdur and hydralazine. Cannot use ACE inhibitor/ARB due to her renal function.   Hypothyroidism continue levothyroxine    Diabetes mellitus type 2 cbg stable. continue home dose insulin    Disposition Plan  : home    Consults  :  Cardiology    Family communication: Friend at bedside.      Discharge Instructions   Allergies as of 12/25/2017      Reactions   Propoxyphene N-acetaminophen    Pt does not know reaction   Simvastatin Other (See Comments)   headache      Medication List    STOP taking these medications   cloNIDine 0.1 MG tablet Commonly known as:  CATAPRES     TAKE these medications   aspirin 81 MG EC tablet Take 1 tablet (81 mg total) by mouth daily.   carvedilol 6.25 MG tablet Commonly known as:  COREG Take 2 tablets (12.5 mg total) by mouth 2 (two) times daily with a meal. What changed:  how much to take   cholecalciferol 1000 units tablet Commonly known as:  VITAMIN D Take 1,000 Units by mouth daily.   cloNIDine 0.3 mg/24hr patch Commonly known as:  CATAPRES - Dosed in mg/24 hr Place 1  patch (0.3 mg total) onto the skin once a week. Start taking on:  12/29/2017   clopidogrel 75 MG tablet Commonly known as:  PLAVIX Take 1 tablet (75 mg total) by mouth daily. Start taking on:  12/26/2017   diclofenac sodium 1 % Gel Commonly known as:  VOLTAREN Apply 4 g topically 4  (four) times daily. What changed:    when to take this  reasons to take this   feeding supplement (ENSURE ENLIVE) Liqd Take 237 mLs by mouth 2 (two) times daily between meals.   hydrALAZINE 50 MG tablet Commonly known as:  APRESOLINE Take 1 tablet (50 mg total) by mouth 2 (two) times daily. What changed:  when to take this   HYDROcodone-acetaminophen 5-325 MG tablet Commonly known as:  NORCO Take 0.5-1 tablets every 6 (six) hours as needed by mouth for moderate pain.   insulin NPH Human 100 UNIT/ML injection Commonly known as:  HUMULIN N Inject 0.1 mLs (10 Units total) into the skin 2 (two) times daily before a meal. What changed:  additional instructions   isosorbide mononitrate 120 MG 24 hr tablet Commonly known as:  IMDUR Take 1 tablet (120 mg total) by mouth daily.   levothyroxine 50 MCG tablet Commonly known as:  SYNTHROID, LEVOTHROID TAKE 1 TABLET DAILY   nitroGLYCERIN 0.4 MG SL tablet Commonly known as:  NITROSTAT PLACE 1 TABLET UNDER THE TONGUE AT ONSET OF CHEST PAIN EVERY 5 MINTUES UP TO 3 TIMES AS NEEDED   pantoprazole 40 MG tablet Commonly known as:  PROTONIX Take 1 tablet (40 mg total) by mouth 2 (two) times daily. X one month, then once daily What changed:    when to take this  additional instructions   polyethylene glycol powder powder Commonly known as:  GLYCOLAX/MIRALAX MIX 1 CAPFUL (17 GRAMS) WITH 8OZ OF WATER OR JUICE DAILY. What changed:  See the new instructions.   rosuvastatin 10 MG tablet Commonly known as:  CRESTOR TAKE 1 TABLET DAILY   sodium polystyrene 15 GM/60ML suspension Commonly known as:  KAYEXALATE Take 60 mLs (15 g total) by mouth daily. What changed:    when to take this  reasons to take this   sucralfate 1 GM/10ML suspension Commonly known as:  CARAFATE Take 10 mLs (1 g total) by mouth every 6 (six) hours. What changed:    when to take this  reasons to take this   torsemide 20 MG tablet Commonly known as:   DEMADEX Take 1 tablet (20 mg total) by mouth 2 (two) times daily.      Follow-up Information    Timmothy Euler, MD. Schedule an appointment as soon as possible for a visit in 1 week(s).   Specialty:  Family Medicine Contact information: [place] [address] [phone]        Arnoldo Lenis, MD Follow up in 3 week(s).   Specialty:  Cardiology Why:  office will call for appt Contact information: [place] [address] [phone]          Allergies  Allergen Reactions  . Propoxyphene N-Acetaminophen     Pt does not know reaction  . Simvastatin Other (See Comments)    headache        Procedures/Studies: Dg Chest Portable 1 View  Result Date: 12/22/2017 CLINICAL DATA:  Mid sternal chest pain EXAM: PORTABLE CHEST 1 VIEW COMPARISON:  11/24/2017 FINDINGS: Heart is borderline in size. Bibasilar opacities likely reflect atelectasis. No overt edema. No  acute bony abnormality IMPRESSION: Bibasilar opacities, likely atelectasis. Electronically Signed   By: Rolm Baptise M.D.   On: 12/22/2017 07:52    2-D echo Study Conclusions  - Left ventricle: The cavity size was normal. There was moderate   concentric hypertrophy. Systolic function was normal. The   estimated ejection fraction was in the range of 60% to 65%. Wall   motion was normal; there were no regional wall motion   abnormalities. Features are consistent with a pseudonormal left   ventricular filling pattern, with concomitant abnormal relaxation   and increased filling pressure (grade 2 diastolic dysfunction).   Doppler parameters are consistent with high ventricular filling   pressure. - Aortic valve: Severe diffuse thickening and calcification   involving the noncoronary cusp. Noncoronary cusp mobility was   moderately restricted. There was very mild stenosis. Valve area   (VTI): 1.78 cm^2. Valve area (Vmax): 1.81 cm^2. Valve area   (Vmean): 1.56 cm^2. - Mitral valve:  Calcified annulus. There was mild regurgitation.   Valve area by continuity equation (using LVOT flow): 1.53 cm^2. - Left atrium: The atrium was severely dilated. - Right atrium: The atrium was mildly dilated. - Pulmonary arteries: Systolic pressure could not be accurately   estimated.  Subjective: Denies any chest pain symptoms. Feels better overall.  Discharge Exam: Vitals:   12/25/17 0917 12/25/17 1115  BP:    Pulse:    Resp: 15 17  Temp:  98 F (36.7 C)  SpO2:     Vitals:   12/25/17 0725 12/25/17 0800 12/25/17 0917 12/25/17 1115  BP:      Pulse: (!) 53 66    Resp: 14 (!) 28 15 17   Temp: 98.9 F (37.2 C)   98 F (36.7 C)  TempSrc: Oral   Oral  SpO2: 99% 99%    Weight:      Height:        General: Elderly female not in distress HEENT: Moist mucosa, supple neck Chest: Clear to auscultation bilaterally CVS: Normal S1 and S2, no murmurs rub or gallop GI: Soft, nondistended, nontender Culture: Warm, no edema CNS: Alert and oriented    The results of significant diagnostics from this hospitalization (including imaging, microbiology, ancillary and laboratory) are listed below for reference.     Microbiology: Recent Results (from the past 240 hour(s))  MRSA PCR Screening     Status: None   Collection Time: 12/22/17  7:55 PM  Result Value Ref Range Status   MRSA by PCR NEGATIVE NEGATIVE Final    Comment:        The GeneXpert MRSA Assay (FDA approved for NASAL specimens only), is one component of a comprehensive MRSA colonization surveillance program. It is not intended to diagnose MRSA infection nor to guide or monitor treatment for MRSA infections.      Labs: BNP (last 3 results) Recent Labs    09/20/17 1940  BNP 0,814.4*   Basic Metabolic Panel: Recent Labs  Lab 12/22/17 0709 12/22/17 0733 12/24/17 0433 12/25/17 0444  NA 141 139 141 138  K 4.9 4.9 4.8 4.9  CL 110 106 107 103  CO2  --  21* 24 24  GLUCOSE 177* 190* 60* 94  BUN 51* 58* 59*  65*  CREATININE 3.50* 3.30* 3.58* 3.88*  CALCIUM  --  9.5 9.8 9.5   Liver Function Tests: No results for input(s): AST, ALT, ALKPHOS, BILITOT, PROT, ALBUMIN in the last 168 hours. No results for input(s): LIPASE, AMYLASE in the last 168  hours. No results for input(s): AMMONIA in the last 168 hours. CBC: Recent Labs  Lab 12/22/17 0709 12/22/17 0733 12/23/17 0435 12/24/17 0433 12/25/17 0444  WBC  --  5.6 4.0 3.3* 3.0*  NEUTROABS  --  4.2  --   --   --   HGB 8.2* 8.8* 7.7* 9.7* 9.0*  HCT 24.0* 28.7* 26.4* 30.4* 28.9*  MCV  --  96.0 97.4 93.0 93.5  PLT  --  168 173 130* 122*   Cardiac Enzymes: Recent Labs  Lab 12/22/17 1116 12/22/17 1631 12/22/17 2225  TROPONINI 0.23* 2.32* 4.47*   BNP: Invalid input(s): POCBNP CBG: Recent Labs  Lab 12/24/17 1155 12/24/17 1626 12/24/17 2104 12/25/17 0724 12/25/17 1115  GLUCAP 81 89 96 116* 158*   D-Dimer No results for input(s): DDIMER in the last 72 hours. Hgb A1c Recent Labs    12/24/17 0556  HGBA1C 6.6*   Lipid Profile No results for input(s): CHOL, HDL, LDLCALC, TRIG, CHOLHDL, LDLDIRECT in the last 72 hours. Thyroid function studies No results for input(s): TSH, T4TOTAL, T3FREE, THYROIDAB in the last 72 hours.  Invalid input(s): FREET3 Anemia work up No results for input(s): VITAMINB12, FOLATE, FERRITIN, TIBC, IRON, RETICCTPCT in the last 72 hours. Urinalysis    Component Value Date/Time   COLORURINE YELLOW 10/15/2017 0910   APPEARANCEUR Clear 11/16/2017 1213   LABSPEC 1.012 10/15/2017 0910   PHURINE 5.0 10/15/2017 0910   GLUCOSEU Trace (A) 11/16/2017 1213   HGBUR NEGATIVE 10/15/2017 0910   BILIRUBINUR Negative 11/16/2017 1213   KETONESUR NEGATIVE 10/15/2017 0910   PROTEINUR 3+ (A) 11/16/2017 1213   PROTEINUR 100 (A) 10/15/2017 0910   UROBILINOGEN negative 07/02/2015 1505   UROBILINOGEN 0.2 05/10/2015 1245   NITRITE Negative 11/16/2017 1213   NITRITE NEGATIVE 10/15/2017 0910   LEUKOCYTESUR Trace (A)  11/16/2017 1213   Sepsis Labs Invalid input(s): PROCALCITONIN,  WBC,  LACTICIDVEN Microbiology Recent Results (from the past 240 hour(s))  MRSA PCR Screening     Status: None   Collection Time: 12/22/17  7:55 PM  Result Value Ref Range Status   MRSA by PCR NEGATIVE NEGATIVE Final    Comment:        The GeneXpert MRSA Assay (FDA approved for NASAL specimens only), is one component of a comprehensive MRSA colonization surveillance program. It is not intended to diagnose MRSA infection nor to guide or monitor treatment for MRSA infections.      Time coordinating discharge: Over 30 minutes  SIGNED:   Louellen Molder, MD  [facility] 12/25/2017, 12:58 PM Pager   If 7PM-7AM, please contact night-coverage [email or web address] Password [email or web address]

## 2017-12-25 NOTE — Care Management Note (Signed)
Case Management Note  Patient Details  Name: [name] MRN: [id number] Date of Birth: 03-07-1932     Expected Discharge Date:  12/25/17               Expected Discharge Plan:  [place]  In-House Referral:     Discharge planning Services  CM Consult  Post Acute Care Choice:    Choice offered to:  Patient, Adult Children  DME Arranged:    DME Agency:     HH Arranged:  RN, PT [place] Agency:  [place]  Status of Service:  Completed, signed off  If discussed at [place] of Stay Meetings, dates discussed:    Additional Comments: Patient discharging today . Has RW, lives iwht daughter. Has PCP, daughter transports to appointments.  Will need HH RN and PT. Would like [facility]. Juliann Pulse of [facility] notified and will obtain orders from [place].   Abigaile Rossie, Chauncey Reading, RN 12/25/2017, 2:15 PM

## 2017-12-25 NOTE — Discharge Instructions (Signed)
Heart Attack A heart attack (myocardial infarction, MI) causes damage to the heart that cannot be fixed. A heart attack often happens when a blood clot or other blockage cuts blood flow to the heart. When this happens, certain areas of the heart begin to die. This causes the pain you feel during a heart attack. Follow these instructions at home:  Take medicine as told by your doctor. You may need medicine to: ? Keep your blood from clotting too easily. ? Control your blood pressure. ? Lower your cholesterol. ? Control abnormal heart rhythms.  Change certain behaviors as told by your doctor. This may include: ? Quitting smoking. ? Being active. ? Eating a heart-healthy diet. Ask your doctor for help with this diet. ? Keeping a healthy weight. ? Keeping your diabetes under control. ? Lessening stress. ? Limiting how much alcohol you drink. Do not take these medicines unless your doctor says that you can:  Nonsteroidal anti-inflammatory drugs (NSAIDs). These include: ? Ibuprofen. ? Naproxen. ? Celecoxib.  Vitamin supplements that have vitamin A, vitamin E, or both.  Hormone therapy that contains estrogen with or without progestin.  Get help right away if:  You have sudden chest discomfort.  You have sudden discomfort in your: ? Arms. ? Back. ? Neck. ? Jaw.  You have shortness of breath at any time.  You have sudden sweating or clammy skin.  You feel sick to your stomach (nauseous) or throw up (vomit).  You suddenly get light-headed or dizzy.  You feel your heart beating fast or skipping beats. These symptoms may be an emergency. Do not wait to see if the symptoms will go away. Get medical help right away. Call your local emergency services (911 in the [place] Do not drive yourself to the hospital. This information is not intended to replace advice given to you by your health care provider. Make sure you discuss any questions you have with your health care  provider. Document Released: 05/15/2012 Document Revised: 04/21/2016 Document Reviewed: 01/17/2014 Elsevier Interactive Patient Education  2017 Elsevier Inc.  

## 2017-12-25 NOTE — Care Management Important Message (Signed)
Important Message  Patient Details  Name: [name] MRN: [id number] Date of Birth: [birth year]   Medicare Important Message Given:  Yes    Montrae Braithwaite, Chauncey Reading, RN 12/25/2017, 2:18 PM

## 2017-12-25 NOTE — Consult Note (Signed)
Cardiology Consultation:   Patient ID: [name]; [id number]; [birth year]   Admit date: 12/22/2017 Date of Consult: 12/25/2017  Primary Care Provider: Timmothy Euler, MD Primary Cardiologist: Dr Percival Spanish Primary Electrophysiologist:  n/a  Patient Profile:   [name] is a 84 y.o. [name] with a hx of CAD who is being seen today for the evaluation of NSTEMI at the request of Dr Clementeen Graham.  History of Present Illness:   [name] 84 yo [name] history of CAD with DES to LAD and OM, HTN, CKD, dementia, chronic diastolic HF, presented with chest pain. Prior admissions with chest, some in setting of severe HTN. 02/2017 lexiscan without ischemia. At that time chest pain may be related to esophagitis noted on CT. From notes she also has refused cath in the past multiple times.   Admitted with chest pain. Severe HTN in ER due to medication noncompliance including clonidine. Trop up to 4.5, family and patient refused transfer to [facility] for cath.   K 4.9, Cr 3.88, WBC 4, Hgb 7.7, Plt 173 CXR bibasilar opacties Echo VEF 60-65%, no WWAs, grade II diastolic dysfunction    Past Medical History:  Diagnosis Date  . CAD (coronary artery disease) 2003   Last catheterization 2008. Two-vessel PCI of the first OM branch and mid LAD.  Marland Kitchen CKD (chronic kidney disease)   . Congestive heart failure ([place]) 02/2017   grade 2 diastolic dysfunction  . CVA (cerebral vascular accident) ([place]) [place]   . Dementia   . DJD (degenerative joint disease) of lumbar spine   . Hypercholesteremia   . Hypertension   . IDDM (insulin dependent diabetes mellitus) ([place])     Past Surgical History:  Procedure Laterality Date  . APPENDECTOMY    . KNEE SURGERY        Inpatient Medications: Scheduled Meds: . aspirin EC  81 mg Oral Daily  . calcium carbonate  1 tablet Oral BID  . carvedilol  12.5 mg Oral BID WC  . cholecalciferol  1,000 Units Oral Daily  . cloNIDine  0.3 mg Transdermal  Weekly  . feeding supplement (ENSURE ENLIVE)  237 mL Oral BID BM  . heparin injection (subcutaneous)  5,000 Units Subcutaneous Q8H  . hydrALAZINE  50 mg Oral TID  . insulin aspart  0-9 Units Subcutaneous TID WC  . insulin NPH Human  10 Units Subcutaneous BID AC  . isosorbide mononitrate  120 mg Oral Daily  . levothyroxine  50 mcg Oral BH-q7a  . pantoprazole  40 mg Oral BID  . rosuvastatin  10 mg Oral Daily  . sucralfate  1 g Oral Q6H  . torsemide  20 mg Oral BID   Continuous Infusions:  PRN Meds: acetaminophen, fentaNYL (SUBLIMAZE) injection, guaiFENesin-dextromethorphan, hydrALAZINE, HYDROcodone-acetaminophen, labetalol, nitroGLYCERIN, ondansetron (ZOFRAN) IV  Allergies:    Allergies  Allergen Reactions  . Propoxyphene N-Acetaminophen     Pt does not know reaction  . Simvastatin Other (See Comments)    headache    Social History:   Social History   Socioeconomic History  . Marital status: Widowed    Spouse name: Not on file  . Number of children: Not on file  . Years of education: Not on file  . Highest education level: Not on file  Social Needs  . Financial resource strain: Not on file  . Food insecurity - worry: Not on file  . Food insecurity - inability: Not on file  . Transportation needs - medical: Not on  file  . Transportation needs - non-medical: Not on file  Occupational History  . Occupation: Retired  Tobacco Use  . Smoking status: Never Smoker  . Smokeless tobacco: Never Used  Substance and Sexual Activity  . Alcohol use: No  . Drug use: No  . Sexual activity: No  Other Topics Concern  . Not on file  Social History Narrative      Lives with daughter     Family History:    Family History  Problem Relation Age of Onset  . Heart attack Mother 77  . Heart attack Father 26  . Diabetes Brother        RETINOPATHY   . Drug abuse Brother   . Liver cancer Brother   . Breast cancer Daughter   . Diabetes Sister      ROS:  Please see the history  of present illness.   All other ROS reviewed and negative.     Physical Exam/Data:   Vitals:   12/25/17 0400 12/25/17 0500 12/25/17 0600 12/25/17 0725  BP: (!) 157/50 (!) 156/46 (!) 152/45   Pulse: (!) 52 (!) 52 (!) 52 (!) 53  Resp: 13 13 15 14   Temp: 97.8 F (36.6 C)   98.9 F (37.2 C)  TempSrc: Oral   Oral  SpO2: 98% 98% 98% 99%  Weight:  137 lb 2 oz (62.2 kg)    Height:        Intake/Output Summary (Last 24 hours) at 12/25/2017 0752 Last data filed at 12/25/2017 0500 Gross per 24 hour  Intake 365.3 ml  Output 1000 ml  Net -634.7 ml   Filed Weights   12/23/17 0400 12/24/17 0500 12/25/17 0500  Weight: 137 lb 2 oz (62.2 kg) 136 lb 7.4 oz (61.9 kg) 137 lb 2 oz (62.2 kg)   Body mass index is 24.29 kg/m.  General:  Well nourished, well developed, in no acute distress HEENT: normal Lymph: no adenopathy Neck: no JVD Endocrine:  No thryomegaly Cardiac:  normal S1, S2; RRR; no murmur  Lungs:  clear to auscultation bilaterally, no wheezing, rhonchi or rales  Abd: soft, nontender, no hepatomegaly  Ext: no edema Musculoskeletal:  No deformities, BUE and BLE strength normal and equal Skin: warm and dry  Neuro:  CNs 2-12 intact, no focal abnormalities noted Psych:  Normal affect     Laboratory Data:  Chemistry Recent Labs  Lab 12/22/17 0733 12/24/17 0433 12/25/17 0444  NA 139 141 138  K 4.9 4.8 4.9  CL 106 107 103  CO2 21* 24 24  GLUCOSE 190* 60* 94  BUN 58* 59* 65*  CREATININE 3.30* 3.58* 3.88*  CALCIUM 9.5 9.8 9.5  GFRNONAA 12* 11* 10*  GFRAA 14* 13* 11*  ANIONGAP 12 10 11     No results for input(s): PROT, ALBUMIN, AST, ALT, ALKPHOS, BILITOT in the last 168 hours. Hematology Recent Labs  Lab 12/23/17 0435 12/24/17 0433 12/25/17 0444  WBC 4.0 3.3* 3.0*  RBC 2.71* 3.27* 3.09*  HGB 7.7* 9.7* 9.0*  HCT 26.4* 30.4* 28.9*  MCV 97.4 93.0 93.5  MCH 28.4 29.7 29.1  MCHC 29.2* 31.9 31.1  RDW 13.6 14.9 14.5  PLT 173 130* 122*   Cardiac Enzymes Recent  Labs  Lab 12/22/17 1116 12/22/17 1631 12/22/17 2225  TROPONINI 0.23* 2.32* 4.47*    Recent Labs  Lab 12/22/17 0707  TROPIPOC 0.03    BNPNo results for input(s): BNP, PROBNP in the last 168 hours.  DDimer No results for input(s): DDIMER  in the last 168 hours.  Radiology/Studies:  Dg Chest Portable 1 View  Result Date: 12/22/2017 CLINICAL DATA:  Mid sternal chest pain EXAM: PORTABLE CHEST 1 VIEW COMPARISON:  11/24/2017 FINDINGS: Heart is borderline in size. Bibasilar opacities likely reflect atelectasis. No overt edema. No acute bony abnormality IMPRESSION: Bibasilar opacities, likely atelectasis. Electronically Signed   By: Rolm Baptise M.D.   On: 12/22/2017 07:52    Assessment and Plan:   1. NSTEMI - patient has refused invasive management, consistent with her prior refusals - trop up to 4.5, unclear peak. Echo with normal LVEF, no WMAs. EKG inferior and lateral TWIs.  - medical therapy with ASA 81, coreg, hep gtt, crestor 10. No ACE/ARB due to poor renal function.   - she tells me under no condition would she consider cath at any point - has completed 48hrs of heparin, ok to d/c. We will start plavix 75mg  daily, will need cbc moniotring as ouptatient.   2. Anemia - hgb to 7.7 this admit, received 2 units prbcs - hx of chronic anemia on procrit  3. HTN - agree with change to clonidine patch. As outpatient consider weaning clonidine all together.    We will arrange outpt f/u, we will signoff inpt care.  For questions or updates, please contact [facility] Please consult [email or web address] for contact info under Cardiology/STEMI.   Shelby Pew, MD  12/25/2017 7:52 AM

## 2017-12-26 DIAGNOSIS — N183 Chronic kidney disease, stage 3 (moderate): Secondary | ICD-10-CM | POA: Diagnosis not present

## 2017-12-26 DIAGNOSIS — Z794 Long term (current) use of insulin: Secondary | ICD-10-CM | POA: Diagnosis not present

## 2017-12-26 DIAGNOSIS — N184 Chronic kidney disease, stage 4 (severe): Secondary | ICD-10-CM | POA: Diagnosis not present

## 2017-12-26 DIAGNOSIS — I2510 Atherosclerotic heart disease of native coronary artery without angina pectoris: Secondary | ICD-10-CM | POA: Diagnosis not present

## 2017-12-26 DIAGNOSIS — E1122 Type 2 diabetes mellitus with diabetic chronic kidney disease: Secondary | ICD-10-CM | POA: Diagnosis not present

## 2017-12-26 DIAGNOSIS — Z791 Long term (current) use of non-steroidal anti-inflammatories (NSAID): Secondary | ICD-10-CM | POA: Diagnosis not present

## 2017-12-26 DIAGNOSIS — I214 Non-ST elevation (NSTEMI) myocardial infarction: Secondary | ICD-10-CM | POA: Diagnosis not present

## 2017-12-26 DIAGNOSIS — I5033 Acute on chronic diastolic (congestive) heart failure: Secondary | ICD-10-CM | POA: Diagnosis not present

## 2017-12-26 DIAGNOSIS — I129 Hypertensive chronic kidney disease with stage 1 through stage 4 chronic kidney disease, or unspecified chronic kidney disease: Secondary | ICD-10-CM | POA: Diagnosis not present

## 2017-12-26 DIAGNOSIS — Z7982 Long term (current) use of aspirin: Secondary | ICD-10-CM | POA: Diagnosis not present

## 2017-12-26 DIAGNOSIS — Z79891 Long term (current) use of opiate analgesic: Secondary | ICD-10-CM | POA: Diagnosis not present

## 2017-12-26 DIAGNOSIS — D631 Anemia in chronic kidney disease: Secondary | ICD-10-CM | POA: Diagnosis not present

## 2017-12-28 DIAGNOSIS — I129 Hypertensive chronic kidney disease with stage 1 through stage 4 chronic kidney disease, or unspecified chronic kidney disease: Secondary | ICD-10-CM | POA: Diagnosis not present

## 2017-12-28 DIAGNOSIS — Z791 Long term (current) use of non-steroidal anti-inflammatories (NSAID): Secondary | ICD-10-CM | POA: Diagnosis not present

## 2017-12-28 DIAGNOSIS — Z79891 Long term (current) use of opiate analgesic: Secondary | ICD-10-CM | POA: Diagnosis not present

## 2017-12-28 DIAGNOSIS — I2510 Atherosclerotic heart disease of native coronary artery without angina pectoris: Secondary | ICD-10-CM | POA: Diagnosis not present

## 2017-12-28 DIAGNOSIS — D631 Anemia in chronic kidney disease: Secondary | ICD-10-CM | POA: Diagnosis not present

## 2017-12-28 DIAGNOSIS — Z794 Long term (current) use of insulin: Secondary | ICD-10-CM | POA: Diagnosis not present

## 2017-12-28 DIAGNOSIS — E1122 Type 2 diabetes mellitus with diabetic chronic kidney disease: Secondary | ICD-10-CM | POA: Diagnosis not present

## 2017-12-28 DIAGNOSIS — Z7982 Long term (current) use of aspirin: Secondary | ICD-10-CM | POA: Diagnosis not present

## 2017-12-28 DIAGNOSIS — I214 Non-ST elevation (NSTEMI) myocardial infarction: Secondary | ICD-10-CM | POA: Diagnosis not present

## 2017-12-28 DIAGNOSIS — N184 Chronic kidney disease, stage 4 (severe): Secondary | ICD-10-CM | POA: Diagnosis not present

## 2018-01-01 ENCOUNTER — Other Ambulatory Visit: Payer: Self-pay | Admitting: Family Medicine

## 2018-01-01 DIAGNOSIS — I129 Hypertensive chronic kidney disease with stage 1 through stage 4 chronic kidney disease, or unspecified chronic kidney disease: Secondary | ICD-10-CM | POA: Diagnosis not present

## 2018-01-01 DIAGNOSIS — E1122 Type 2 diabetes mellitus with diabetic chronic kidney disease: Secondary | ICD-10-CM | POA: Diagnosis not present

## 2018-01-01 DIAGNOSIS — D631 Anemia in chronic kidney disease: Secondary | ICD-10-CM | POA: Diagnosis not present

## 2018-01-01 DIAGNOSIS — I2510 Atherosclerotic heart disease of native coronary artery without angina pectoris: Secondary | ICD-10-CM | POA: Diagnosis not present

## 2018-01-01 DIAGNOSIS — Z794 Long term (current) use of insulin: Secondary | ICD-10-CM | POA: Diagnosis not present

## 2018-01-01 DIAGNOSIS — N184 Chronic kidney disease, stage 4 (severe): Secondary | ICD-10-CM | POA: Diagnosis not present

## 2018-01-01 DIAGNOSIS — Z791 Long term (current) use of non-steroidal anti-inflammatories (NSAID): Secondary | ICD-10-CM | POA: Diagnosis not present

## 2018-01-01 DIAGNOSIS — I214 Non-ST elevation (NSTEMI) myocardial infarction: Secondary | ICD-10-CM | POA: Diagnosis not present

## 2018-01-01 DIAGNOSIS — Z79891 Long term (current) use of opiate analgesic: Secondary | ICD-10-CM | POA: Diagnosis not present

## 2018-01-01 DIAGNOSIS — Z7982 Long term (current) use of aspirin: Secondary | ICD-10-CM | POA: Diagnosis not present

## 2018-01-02 ENCOUNTER — Encounter: Payer: Self-pay | Admitting: Family Medicine

## 2018-01-02 ENCOUNTER — Ambulatory Visit (INDEPENDENT_AMBULATORY_CARE_PROVIDER_SITE_OTHER): Payer: Medicare Other | Admitting: Family Medicine

## 2018-01-02 VITALS — BP 134/60 | HR 60 | Temp 96.8°F | Ht 63.0 in | Wt 137.0 lb

## 2018-01-02 DIAGNOSIS — I10 Essential (primary) hypertension: Secondary | ICD-10-CM | POA: Diagnosis not present

## 2018-01-02 DIAGNOSIS — I2510 Atherosclerotic heart disease of native coronary artery without angina pectoris: Secondary | ICD-10-CM

## 2018-01-02 DIAGNOSIS — D631 Anemia in chronic kidney disease: Secondary | ICD-10-CM

## 2018-01-02 DIAGNOSIS — N184 Chronic kidney disease, stage 4 (severe): Secondary | ICD-10-CM

## 2018-01-02 MED ORDER — CARVEDILOL 12.5 MG PO TABS
12.5000 mg | ORAL_TABLET | Freq: Two times a day (BID) | ORAL | 3 refills | Status: DC
Start: 2018-01-02 — End: 2018-05-01

## 2018-01-02 MED ORDER — CARVEDILOL 6.25 MG PO TABS: 12.5000 mg | ORAL_TABLET | Freq: Two times a day (BID) | ORAL | 5 refills | Status: DC

## 2018-01-02 MED ORDER — ROSUVASTATIN CALCIUM 10 MG PO TABS: 10.0000 mg | ORAL_TABLET | Freq: Every day | ORAL | 3 refills | Status: DC

## 2018-01-02 MED ORDER — CLONIDINE HCL 0.1 MG PO TABS: 0.1000 mg | ORAL_TABLET | Freq: Three times a day (TID) | ORAL | 3 refills | Status: DC

## 2018-01-02 MED ORDER — GLUCOSE BLOOD VI STRP: ORAL_STRIP | 3 refills | Status: AC
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
## 2018-01-02 NOTE — Patient Instructions (Signed)
Great to see you!  Come back in 1 month unless you need us sooner.    

## 2018-01-03 ENCOUNTER — Ambulatory Visit: Payer: Medicare Other

## 2018-01-03 DIAGNOSIS — I214 Non-ST elevation (NSTEMI) myocardial infarction: Secondary | ICD-10-CM | POA: Diagnosis not present

## 2018-01-03 DIAGNOSIS — Z79891 Long term (current) use of opiate analgesic: Secondary | ICD-10-CM | POA: Diagnosis not present

## 2018-01-03 DIAGNOSIS — N184 Chronic kidney disease, stage 4 (severe): Secondary | ICD-10-CM | POA: Diagnosis not present

## 2018-01-03 DIAGNOSIS — Z7982 Long term (current) use of aspirin: Secondary | ICD-10-CM | POA: Diagnosis not present

## 2018-01-03 DIAGNOSIS — I2510 Atherosclerotic heart disease of native coronary artery without angina pectoris: Secondary | ICD-10-CM | POA: Diagnosis not present

## 2018-01-03 DIAGNOSIS — D631 Anemia in chronic kidney disease: Secondary | ICD-10-CM | POA: Diagnosis not present

## 2018-01-03 DIAGNOSIS — Z794 Long term (current) use of insulin: Secondary | ICD-10-CM | POA: Diagnosis not present

## 2018-01-03 DIAGNOSIS — Z791 Long term (current) use of non-steroidal anti-inflammatories (NSAID): Secondary | ICD-10-CM | POA: Diagnosis not present

## 2018-01-03 DIAGNOSIS — I129 Hypertensive chronic kidney disease with stage 1 through stage 4 chronic kidney disease, or unspecified chronic kidney disease: Secondary | ICD-10-CM | POA: Diagnosis not present

## 2018-01-03 DIAGNOSIS — E1122 Type 2 diabetes mellitus with diabetic chronic kidney disease: Secondary | ICD-10-CM | POA: Diagnosis not present

## 2018-01-04 ENCOUNTER — Ambulatory Visit (INDEPENDENT_AMBULATORY_CARE_PROVIDER_SITE_OTHER): Payer: Medicare Other

## 2018-01-04 DIAGNOSIS — I129 Hypertensive chronic kidney disease with stage 1 through stage 4 chronic kidney disease, or unspecified chronic kidney disease: Secondary | ICD-10-CM

## 2018-01-04 DIAGNOSIS — I2510 Atherosclerotic heart disease of native coronary artery without angina pectoris: Secondary | ICD-10-CM

## 2018-01-04 DIAGNOSIS — I214 Non-ST elevation (NSTEMI) myocardial infarction: Secondary | ICD-10-CM | POA: Diagnosis not present

## 2018-01-04 DIAGNOSIS — E1122 Type 2 diabetes mellitus with diabetic chronic kidney disease: Secondary | ICD-10-CM

## 2018-01-07 NOTE — Progress Notes (Deleted)
Cardiology Office Note    Date:  01/07/2018   ID:  [name], DOB [birth year], MRN [id number]  PCP:  Timmothy Euler, MD  Cardiologist: Dr. Percival Spanish  No chief complaint on file.   History of Present Illness:    [name] is a 84 y.o. female with past medical history of CAD (s/p DES to OM and DES to mid-LAD in 2003), chronic diastolic CHF, HTN, HLD, Stage 4-5 CKD, and IDDM who presents to the office today for hospital follow-up.   She was recently admitted to [name] on 12/22/2017 for evaluation of chest pain and found to have an NSTEMI with peak troponin of 4.47. She refused transfer to [facility] for a cardiac catheterization in the setting of her advanced CKD and was therefore treated medically with heparin for 48 hours. Echocardiogram showed a preserved EF of 60-65% with Grade 2 DD and no regional WMA. Did have mild AS and mild MR with severe dilation of the LA. Cardiology was consulted and she was continued on ASA, Carvedilol, and statin therapy with Plavix 75 mg daily being added to her medication regimen.  She was found to be anemic with a hemoglobin of 7.7 during admission and received 2 units PRBCs. Hgb was stable at 9.0 on 1/28 at the time of discharge.     Past Medical History:  Diagnosis Date  . CAD (coronary artery disease) 2003   Last catheterization 2008. Two-vessel PCI of the first OM branch and mid LAD.  [name] CKD (chronic kidney disease)   . Congestive heart failure ([place]) 02/2017   grade 2 diastolic dysfunction  . CVA (cerebral vascular accident) ([place]) [name]   . Dementia   . DJD (degenerative joint disease) of lumbar spine   . Hypercholesteremia   . Hypertension   . IDDM (insulin dependent diabetes mellitus) ([name])     Past Surgical History:  Procedure Laterality Date  . APPENDECTOMY    . KNEE SURGERY      Current Medications: Outpatient Medications Prior to Visit  Medication Sig Dispense Refill  . aspirin EC 81 MG EC tablet Take 1  tablet (81 mg total) by mouth daily. 30 tablet 11  . carvedilol (COREG) 12.5 MG tablet Take 1 tablet (12.5 mg total) by mouth 2 (two) times daily with a meal. 60 tablet 3  . cholecalciferol (VITAMIN D) 1000 units tablet Take 1,000 Units by mouth daily.    . cloNIDine (CATAPRES) 0.1 MG tablet Take 1 tablet (0.1 mg total) by mouth 3 (three) times daily. 90 tablet 3  . clopidogrel (PLAVIX) 75 MG tablet Take 1 tablet (75 mg total) by mouth daily. 30 tablet 0  . diclofenac sodium (VOLTAREN) 1 % GEL Apply 4 g topically 4 (four) times daily. (Patient taking differently: Apply 4 g topically 4 (four) times daily as needed (FOR PAIN). ) 100 g 2  . feeding supplement, ENSURE ENLIVE, (ENSURE ENLIVE) LIQD Take 237 mLs by mouth 2 (two) times daily between meals. 237 mL 12  . glucose blood (ACCU-CHEK AVIVA PLUS) test strip CHECK BLOOD SUGER UP TO 3 TIMES A DAY 300 each 3  . hydrALAZINE (APRESOLINE) 50 MG tablet Take 1 tablet (50 mg total) by mouth 2 (two) times daily. (Patient taking differently: Take 50 mg by mouth daily. ) 30 tablet 0  . HYDROcodone-acetaminophen (NORCO) 5-325 MG tablet Take 0.5-1 tablets every 6 (six) hours as needed by mouth for moderate pain. 30 tablet 0  . insulin  NPH Human (HUMULIN N) 100 UNIT/ML injection Inject 0.1 mLs (10 Units total) into the skin 2 (two) times daily before a meal. (Patient taking differently: Inject 10 Units into the skin 2 (two) times daily before a meal. GIVES BASED ON BLOOD SUGAR LEVELS. IF UNDER 120-NO INSULIN IS GIVEN) 10 mL 1  . isosorbide mononitrate (IMDUR) 120 MG 24 hr tablet Take 1 tablet (120 mg total) by mouth daily. 30 tablet 0  . levothyroxine (SYNTHROID, LEVOTHROID) 50 MCG tablet TAKE 1 TABLET DAILY 90 tablet 3  . nitroGLYCERIN (NITROSTAT) 0.4 MG SL tablet PLACE 1 TABLET UNDER THE TONGUE AT ONSET OF CHEST PAIN EVERY 5 MINTUES UP TO 3 TIMES AS NEEDED 25 tablet 1  . pantoprazole (PROTONIX) 40 MG tablet Take 1 tablet (40 mg total) by mouth 2 (two) times daily.  X one month, then once daily (Patient taking differently: Take 40 mg by mouth daily. X one month, then once daily) 60 tablet 0  . polyethylene glycol powder (GLYCOLAX/MIRALAX) powder MIX 1 CAPFUL (17 GRAMS) WITH 8OZ OF WATER OR JUICE DAILY. (Patient taking differently: MIX 1 CAPFUL (17 GRAMS) WITH 8OZ OF WATER OR JUICE DAILY AS NEEDED FOR CONSTIPATION) 255 g 5  . rosuvastatin (CRESTOR) 10 MG tablet Take 1 tablet (10 mg total) by mouth daily. 90 tablet 3  . sodium polystyrene (KAYEXALATE) 15 GM/60ML suspension Take 60 mLs (15 g total) by mouth daily. (Patient taking differently: Take 15 g by mouth daily as needed. ) 500 mL 2  . sucralfate (CARAFATE) 1 GM/10ML suspension Take 10 mLs (1 g total) by mouth every 6 (six) hours. (Patient taking differently: Take 1 g by mouth daily as needed. ) 420 mL 0  . torsemide (DEMADEX) 20 MG tablet Take 1 tablet (20 mg total) by mouth 2 (two) times daily. 60 tablet 1   No facility-administered medications prior to visit.      Allergies:   Propoxyphene n-acetaminophen and Simvastatin   Social History   Socioeconomic History  . Marital status: Widowed    Spouse name: Not on file  . Number of children: Not on file  . Years of education: Not on file  . Highest education level: Not on file  Social Needs  . Financial resource strain: Not on file  . Food insecurity - worry: Not on file  . Food insecurity - inability: Not on file  . Transportation needs - medical: Not on file  . Transportation needs - non-medical: Not on file  Occupational History  . Occupation: Retired  Tobacco Use  . Smoking status: Never Smoker  . Smokeless tobacco: Never Used  Substance and Sexual Activity  . Alcohol use: No  . Drug use: No  . Sexual activity: No  Other Topics Concern  . Not on file  Social History Narrative      Lives with daughter      Family History:  The patient's ***family history includes Breast cancer in her daughter; Diabetes in her brother and sister;  Drug abuse in her brother; Heart attack (age of onset: 40) in her father; Heart attack (age of onset: 40) in her mother; Liver cancer in her brother.   Review of Systems:   Please see the history of present illness.     General:  No chills, fever, night sweats or weight changes.  Cardiovascular:  No chest pain, dyspnea on exertion, edema, orthopnea, palpitations, paroxysmal nocturnal dyspnea. Dermatological: No rash, lesions/masses Respiratory: No cough, dyspnea Urologic: No hematuria, dysuria Abdominal:  No nausea, vomiting, diarrhea, bright red blood per rectum, melena, or hematemesis Neurologic:  No visual changes, wkns, changes in mental status. All other systems reviewed and are otherwise negative except as noted above.   Physical Exam:    VS:  There were no vitals taken for this visit.   General: Well developed, well nourished,female appearing in no acute distress. Head: Normocephalic, atraumatic, sclera non-icteric, no xanthomas, nares are without discharge.  Neck: No carotid bruits. JVD not elevated.  Lungs: Respirations regular and unlabored, without wheezes or rales.  Heart: ***Regular rate and rhythm. No S3 or S4.  No murmur, no rubs, or gallops appreciated. Abdomen: Soft, non-tender, non-distended with normoactive bowel sounds. No hepatomegaly. No rebound/guarding. No obvious abdominal masses. Msk:  Strength and tone appear normal for age. No joint deformities or effusions. Extremities: No clubbing or cyanosis. No edema.  Distal pedal pulses are 2+ bilaterally. Neuro: Alert and oriented X 3. Moves all extremities spontaneously. No focal deficits noted. Psych:  Responds to questions appropriately with a normal affect. Skin: No rashes or lesions noted  Wt Readings from Last 3 Encounters:  01/02/18 137 lb (62.1 kg)  12/25/17 137 lb 2 oz (62.2 kg)  12/01/17 133 lb 12.8 oz (60.7 kg)        Studies/Labs Reviewed:   EKG:  EKG is*** ordered today.  The ekg ordered today  demonstrates ***  Recent Labs: 03/04/2017: Magnesium 2.3 09/20/2017: B Natriuretic Peptide 1,243.0 11/24/2017: ALT 8 12/22/2017: TSH 3.793 12/25/2017: BUN 65; Creatinine, Ser 3.88; Hemoglobin 9.0; Platelets 122; Potassium 4.9; Sodium 138   Lipid Panel    Component Value Date/Time   CHOL 106 03/05/2017 0406   CHOL 174 09/30/2014 0946   CHOL 177 06/21/2013 1016   TRIG 150 (H) 03/05/2017 0406   TRIG 175 (H) 03/21/2014 1559   TRIG 240 (H) 06/21/2013 1016   HDL 36 (L) 03/05/2017 0406   HDL 66 09/30/2014 0946   HDL 50 03/21/2014 1559   HDL 42 06/21/2013 1016   CHOLHDL 2.9 03/05/2017 0406   VLDL 30 03/05/2017 0406   LDLCALC 40 03/05/2017 0406   LDLCALC 82 09/30/2014 0946   LDLCALC 73 03/21/2014 1559   LDLCALC 87 06/21/2013 1016    Additional studies/ records that were reviewed today include:   Echocardiogram: 12/24/2017 Study Conclusions  - Left ventricle: The cavity size was normal. There was moderate   concentric hypertrophy. Systolic function was normal. The   estimated ejection fraction was in the range of 60% to 65%. Wall   motion was normal; there were no regional wall motion   abnormalities. Features are consistent with a pseudonormal left   ventricular filling pattern, with concomitant abnormal relaxation   and increased filling pressure (grade 2 diastolic dysfunction).   Doppler parameters are consistent with high ventricular filling   pressure. - Aortic valve: Severe diffuse thickening and calcification   involving the noncoronary cusp. Noncoronary cusp mobility was   moderately restricted. There was very mild stenosis. Valve area   (VTI): 1.78 cm^2. Valve area (Vmax): 1.81 cm^2. Valve area   (Vmean): 1.56 cm^2. - Mitral valve: Calcified annulus. There was mild regurgitation.   Valve area by continuity equation (using LVOT flow): 1.53 cm^2. - Left atrium: The atrium was severely dilated. - Right atrium: The atrium was mildly dilated. - Pulmonary arteries: Systolic  pressure could not be accurately   estimated.  Assessment:    No diagnosis found.   Plan:   In order of problems listed above:  1. ***    Medication Adjustments/Labs and Tests Ordered: Current medicines are reviewed at length with the patient today.  Concerns regarding medicines are outlined above.  Medication changes, Labs and Tests ordered today are listed in the Patient Instructions below. There are no Patient Instructions on file for this visit.   Signed, Erma Heritage, PA-C  01/07/2018 6:09 PM    [facility] [address] [place], [place] [address] Phone: [phone]

## 2018-01-08 ENCOUNTER — Ambulatory Visit: Payer: Medicare Other | Admitting: Student

## 2018-01-10 DIAGNOSIS — N184 Chronic kidney disease, stage 4 (severe): Secondary | ICD-10-CM | POA: Diagnosis not present

## 2018-01-10 DIAGNOSIS — Z7982 Long term (current) use of aspirin: Secondary | ICD-10-CM | POA: Diagnosis not present

## 2018-01-10 DIAGNOSIS — Z791 Long term (current) use of non-steroidal anti-inflammatories (NSAID): Secondary | ICD-10-CM | POA: Diagnosis not present

## 2018-01-10 DIAGNOSIS — I214 Non-ST elevation (NSTEMI) myocardial infarction: Secondary | ICD-10-CM | POA: Diagnosis not present

## 2018-01-10 DIAGNOSIS — Z79891 Long term (current) use of opiate analgesic: Secondary | ICD-10-CM | POA: Diagnosis not present

## 2018-01-10 DIAGNOSIS — I2510 Atherosclerotic heart disease of native coronary artery without angina pectoris: Secondary | ICD-10-CM | POA: Diagnosis not present

## 2018-01-10 DIAGNOSIS — Z794 Long term (current) use of insulin: Secondary | ICD-10-CM | POA: Diagnosis not present

## 2018-01-10 DIAGNOSIS — E1122 Type 2 diabetes mellitus with diabetic chronic kidney disease: Secondary | ICD-10-CM | POA: Diagnosis not present

## 2018-01-10 DIAGNOSIS — I129 Hypertensive chronic kidney disease with stage 1 through stage 4 chronic kidney disease, or unspecified chronic kidney disease: Secondary | ICD-10-CM | POA: Diagnosis not present

## 2018-01-10 DIAGNOSIS — D631 Anemia in chronic kidney disease: Secondary | ICD-10-CM | POA: Diagnosis not present

## 2018-01-11 DIAGNOSIS — D631 Anemia in chronic kidney disease: Secondary | ICD-10-CM | POA: Diagnosis not present

## 2018-01-11 DIAGNOSIS — E1122 Type 2 diabetes mellitus with diabetic chronic kidney disease: Secondary | ICD-10-CM | POA: Diagnosis not present

## 2018-01-11 DIAGNOSIS — Z794 Long term (current) use of insulin: Secondary | ICD-10-CM | POA: Diagnosis not present

## 2018-01-11 DIAGNOSIS — N184 Chronic kidney disease, stage 4 (severe): Secondary | ICD-10-CM | POA: Diagnosis not present

## 2018-01-11 DIAGNOSIS — Z79891 Long term (current) use of opiate analgesic: Secondary | ICD-10-CM | POA: Diagnosis not present

## 2018-01-11 DIAGNOSIS — I2510 Atherosclerotic heart disease of native coronary artery without angina pectoris: Secondary | ICD-10-CM | POA: Diagnosis not present

## 2018-01-11 DIAGNOSIS — Z7982 Long term (current) use of aspirin: Secondary | ICD-10-CM | POA: Diagnosis not present

## 2018-01-11 DIAGNOSIS — Z791 Long term (current) use of non-steroidal anti-inflammatories (NSAID): Secondary | ICD-10-CM | POA: Diagnosis not present

## 2018-01-11 DIAGNOSIS — I129 Hypertensive chronic kidney disease with stage 1 through stage 4 chronic kidney disease, or unspecified chronic kidney disease: Secondary | ICD-10-CM | POA: Diagnosis not present

## 2018-01-11 DIAGNOSIS — I214 Non-ST elevation (NSTEMI) myocardial infarction: Secondary | ICD-10-CM | POA: Diagnosis not present

## 2018-01-12 DIAGNOSIS — Z79891 Long term (current) use of opiate analgesic: Secondary | ICD-10-CM | POA: Diagnosis not present

## 2018-01-12 DIAGNOSIS — Z7982 Long term (current) use of aspirin: Secondary | ICD-10-CM | POA: Diagnosis not present

## 2018-01-12 DIAGNOSIS — Z794 Long term (current) use of insulin: Secondary | ICD-10-CM | POA: Diagnosis not present

## 2018-01-12 DIAGNOSIS — N184 Chronic kidney disease, stage 4 (severe): Secondary | ICD-10-CM | POA: Diagnosis not present

## 2018-01-12 DIAGNOSIS — Z791 Long term (current) use of non-steroidal anti-inflammatories (NSAID): Secondary | ICD-10-CM | POA: Diagnosis not present

## 2018-01-12 DIAGNOSIS — D631 Anemia in chronic kidney disease: Secondary | ICD-10-CM | POA: Diagnosis not present

## 2018-01-12 DIAGNOSIS — I214 Non-ST elevation (NSTEMI) myocardial infarction: Secondary | ICD-10-CM | POA: Diagnosis not present

## 2018-01-12 DIAGNOSIS — E1122 Type 2 diabetes mellitus with diabetic chronic kidney disease: Secondary | ICD-10-CM | POA: Diagnosis not present

## 2018-01-12 DIAGNOSIS — I129 Hypertensive chronic kidney disease with stage 1 through stage 4 chronic kidney disease, or unspecified chronic kidney disease: Secondary | ICD-10-CM | POA: Diagnosis not present

## 2018-01-12 DIAGNOSIS — I2510 Atherosclerotic heart disease of native coronary artery without angina pectoris: Secondary | ICD-10-CM | POA: Diagnosis not present

## 2018-01-13 ENCOUNTER — Other Ambulatory Visit: Payer: Self-pay | Admitting: Family Medicine

## 2018-01-15 ENCOUNTER — Other Ambulatory Visit: Payer: Self-pay

## 2018-01-15 MED ORDER — TORSEMIDE 20 MG PO TABS: 20.0000 mg | ORAL_TABLET | Freq: Two times a day (BID) | ORAL | 5 refills | Status: AC

## 2018-01-16 DIAGNOSIS — D631 Anemia in chronic kidney disease: Secondary | ICD-10-CM | POA: Diagnosis not present

## 2018-01-16 DIAGNOSIS — I129 Hypertensive chronic kidney disease with stage 1 through stage 4 chronic kidney disease, or unspecified chronic kidney disease: Secondary | ICD-10-CM | POA: Diagnosis not present

## 2018-01-16 DIAGNOSIS — E1122 Type 2 diabetes mellitus with diabetic chronic kidney disease: Secondary | ICD-10-CM | POA: Diagnosis not present

## 2018-01-16 DIAGNOSIS — I214 Non-ST elevation (NSTEMI) myocardial infarction: Secondary | ICD-10-CM | POA: Diagnosis not present

## 2018-01-16 DIAGNOSIS — Z794 Long term (current) use of insulin: Secondary | ICD-10-CM | POA: Diagnosis not present

## 2018-01-16 DIAGNOSIS — N184 Chronic kidney disease, stage 4 (severe): Secondary | ICD-10-CM | POA: Diagnosis not present

## 2018-01-16 DIAGNOSIS — Z791 Long term (current) use of non-steroidal anti-inflammatories (NSAID): Secondary | ICD-10-CM | POA: Diagnosis not present

## 2018-01-16 DIAGNOSIS — Z79891 Long term (current) use of opiate analgesic: Secondary | ICD-10-CM | POA: Diagnosis not present

## 2018-01-16 DIAGNOSIS — Z7982 Long term (current) use of aspirin: Secondary | ICD-10-CM | POA: Diagnosis not present

## 2018-01-16 DIAGNOSIS — I2510 Atherosclerotic heart disease of native coronary artery without angina pectoris: Secondary | ICD-10-CM | POA: Diagnosis not present

## 2018-01-17 ENCOUNTER — Telehealth: Payer: Self-pay | Admitting: *Deleted

## 2018-01-17 DIAGNOSIS — Z794 Long term (current) use of insulin: Secondary | ICD-10-CM | POA: Diagnosis not present

## 2018-01-17 DIAGNOSIS — I2510 Atherosclerotic heart disease of native coronary artery without angina pectoris: Secondary | ICD-10-CM | POA: Diagnosis not present

## 2018-01-17 DIAGNOSIS — N184 Chronic kidney disease, stage 4 (severe): Secondary | ICD-10-CM | POA: Diagnosis not present

## 2018-01-17 DIAGNOSIS — I214 Non-ST elevation (NSTEMI) myocardial infarction: Secondary | ICD-10-CM | POA: Diagnosis not present

## 2018-01-17 DIAGNOSIS — Z791 Long term (current) use of non-steroidal anti-inflammatories (NSAID): Secondary | ICD-10-CM | POA: Diagnosis not present

## 2018-01-17 DIAGNOSIS — Z79891 Long term (current) use of opiate analgesic: Secondary | ICD-10-CM | POA: Diagnosis not present

## 2018-01-17 DIAGNOSIS — D631 Anemia in chronic kidney disease: Secondary | ICD-10-CM | POA: Diagnosis not present

## 2018-01-17 DIAGNOSIS — Z7982 Long term (current) use of aspirin: Secondary | ICD-10-CM | POA: Diagnosis not present

## 2018-01-17 DIAGNOSIS — I129 Hypertensive chronic kidney disease with stage 1 through stage 4 chronic kidney disease, or unspecified chronic kidney disease: Secondary | ICD-10-CM | POA: Diagnosis not present

## 2018-01-17 DIAGNOSIS — E1122 Type 2 diabetes mellitus with diabetic chronic kidney disease: Secondary | ICD-10-CM | POA: Diagnosis not present

## 2018-01-17 NOTE — Telephone Encounter (Signed)
I recommend ED eval for chest pain.   Laroy Apple, MD [facility] 01/17/2018, 11:55 AM

## 2018-01-17 NOTE — Telephone Encounter (Signed)
Daughter aware per dpr. States that patient will not go to the ER but that she was going to try to get her to go.

## 2018-01-17 NOTE — Telephone Encounter (Signed)
TC from [facility] nurse Pt having chest pain, has taken 1 nitro does not want to take anymore, still having chest pains drowsy, did not sleep well d/t pains Has gained 4lbs Some congestion w/ clear sputum when she can get it up o2 96 BP 152/54 p62 Pt refusing to go to ED states 'all they do is ask for money'

## 2018-01-25 DIAGNOSIS — N183 Chronic kidney disease, stage 3 (moderate): Secondary | ICD-10-CM | POA: Diagnosis not present

## 2018-01-25 DIAGNOSIS — I5033 Acute on chronic diastolic (congestive) heart failure: Secondary | ICD-10-CM | POA: Diagnosis not present

## 2018-01-30 ENCOUNTER — Ambulatory Visit (INDEPENDENT_AMBULATORY_CARE_PROVIDER_SITE_OTHER): Payer: Medicare Other | Admitting: Family Medicine

## 2018-01-30 ENCOUNTER — Encounter: Payer: Self-pay | Admitting: Family Medicine

## 2018-01-30 ENCOUNTER — Ambulatory Visit (INDEPENDENT_AMBULATORY_CARE_PROVIDER_SITE_OTHER): Payer: Medicare Other

## 2018-01-30 VITALS — BP 167/59 | HR 69 | Temp 96.6°F | Ht 63.0 in | Wt 141.8 lb

## 2018-01-30 DIAGNOSIS — I10 Essential (primary) hypertension: Secondary | ICD-10-CM | POA: Diagnosis not present

## 2018-01-30 DIAGNOSIS — R05 Cough: Secondary | ICD-10-CM

## 2018-01-30 DIAGNOSIS — R059 Cough, unspecified: Secondary | ICD-10-CM

## 2018-01-30 MED ORDER — CLONIDINE HCL 0.1 MG PO TABS: 0.2000 mg | ORAL_TABLET | Freq: Three times a day (TID) | ORAL | 3 refills | Status: DC

## 2018-01-30 NOTE — Progress Notes (Signed)
   HPI  Patient presents today here for follow-up hypertension.  Patient and her daughter states that she is taking her medications just as prescribed except for she is taking 0.2 mg of clonidine in the morning and point 1 at night.  They cannot afford clonidine patches.  MH: Smoking status noted ROS: Per HPI  Objective: BP (!) 167/59   Pulse 69   Temp (!) 96.6 F (35.9 C) (Oral)   Ht 5\' 3"  (1.6 m)   Wt 141 lb 12.8 oz (64.3 kg)   BMI 25.12 kg/m  Gen: NAD, alert, cooperative with exam HEENT: NCAT CV: RRR, good S1/S2, 2/6 to 3/6 systolic murmur Resp: Nonlabored, good air movement, coarse breath sounds in right lower lung field persistently Ext: No edema, warm Neuro: Alert and oriented, No gross deficits  Assessment and plan:  #Hypertension Difficult to control, I do wonder about medication compliance given multiple medications and large fluctuations in blood pressure on visits. Titrate clonidine slightly to 0.2 mg 3 times daily Continue hydralazine, Coreg, or some mild, Imdur Follow-up 1 month  #Cough Prolonged cough, actually improving, right lower lung field coarse breath sounds observed today Plain film- appears to be clear to me, radiology read pending    Orders Placed This Encounter  Procedures  . DG Chest 2 View    Standing Status:   Future    Number of Occurrences:   1    Standing Expiration Date:   04/01/2019    Order Specific Question:   Reason for Exam (SYMPTOM  OR DIAGNOSIS REQUIRED)    Answer:   cough    Order Specific Question:   Preferred imaging location?    Answer:   Internal    Meds ordered this encounter  Medications  . cloNIDine (CATAPRES) 0.1 MG tablet    Sig: Take 2 tablets (0.2 mg total) by mouth 3 (three) times daily.    Dispense:  90 tablet    Refill:  [place], MD [facility] 01/30/2018, 9:26 AM

## 2018-02-07 ENCOUNTER — Other Ambulatory Visit: Payer: Self-pay | Admitting: *Deleted

## 2018-02-07 MED ORDER — CLOPIDOGREL BISULFATE 75 MG PO TABS: 75.0000 mg | ORAL_TABLET | Freq: Every day | ORAL | 0 refills | Status: DC

## 2018-02-07 MED ORDER — HYDRALAZINE HCL 50 MG PO TABS: 50.0000 mg | ORAL_TABLET | Freq: Every day | ORAL | 2 refills | Status: DC

## 2018-02-07 NOTE — Addendum Note (Signed)
Addended by: Antonietta Barcelona D on: 02/07/2018 09:40 AM   Modules accepted: Orders

## 2018-02-12 ENCOUNTER — Other Ambulatory Visit: Payer: Self-pay | Admitting: Family Medicine

## 2018-02-20 ENCOUNTER — Other Ambulatory Visit: Payer: Self-pay | Admitting: Family Medicine

## 2018-02-23 DIAGNOSIS — I5033 Acute on chronic diastolic (congestive) heart failure: Secondary | ICD-10-CM | POA: Diagnosis not present

## 2018-02-23 DIAGNOSIS — N183 Chronic kidney disease, stage 3 (moderate): Secondary | ICD-10-CM | POA: Diagnosis not present

## 2018-02-28 ENCOUNTER — Other Ambulatory Visit: Payer: Self-pay | Admitting: Family Medicine

## 2018-02-28 NOTE — Telephone Encounter (Signed)
Patient is not out of insulin - states she has three days left. Sending to PCP to advise

## 2018-02-28 NOTE — Telephone Encounter (Signed)
Is she out of meds? If yes, okay the change. If no, can wait until tomorrow for Dr. [name]

## 2018-02-28 NOTE — Telephone Encounter (Signed)
Covering PCP- please advise and send back to pools.  

## 2018-03-01 MED ORDER — INSULIN NPH (HUMAN) (ISOPHANE) 100 UNIT/ML ~~LOC~~ SUSP: 10.0000 [IU] | Freq: Two times a day (BID) | SUBCUTANEOUS | 5 refills | Status: DC

## 2018-03-01 NOTE — Telephone Encounter (Signed)
Rx sent and updated, changed to novolin N from humalin N due to cost/coverage.   Laroy Apple, MD [facility] 03/01/2018, 7:42 AM

## 2018-03-02 ENCOUNTER — Ambulatory Visit: Payer: Medicare Other | Admitting: Family Medicine

## 2018-03-02 ENCOUNTER — Telehealth: Payer: Self-pay | Admitting: Family Medicine

## 2018-03-02 NOTE — Telephone Encounter (Signed)
I would recommend discussing with her nephrologist as she has severe renal disease and that is likely the problem.   I think it owuld be ok to increase her torsemide to two tabs twice daily for 3 days.   Laroy Apple, MD [facility] 03/02/2018, 3:31 PM

## 2018-03-02 NOTE — Telephone Encounter (Signed)
lmtcb

## 2018-03-02 NOTE — Telephone Encounter (Signed)
No answer, voicemail full 

## 2018-03-02 NOTE — Telephone Encounter (Signed)
Spoke to [employer], Therapist, sports with [employer]. States that she spoke to patient and patient has been having bilateral ankle swelling.  States that she is not having any cough or SOB.  Patient had apt today but canceled it. Spoke with daughter and re schedule patient to come in 4/11 at 9:10am to have her ankles looked at.

## 2018-03-05 ENCOUNTER — Emergency Department (HOSPITAL_COMMUNITY): Payer: Medicare Other

## 2018-03-05 ENCOUNTER — Observation Stay (HOSPITAL_COMMUNITY)
Admission: EM | Admit: 2018-03-05 | Discharge: 2018-03-07 | Disposition: A | Discharge status: Home / Self Care | Payer: Medicare Other | Attending: Internal Medicine | Admitting: Internal Medicine

## 2018-03-05 ENCOUNTER — Encounter (HOSPITAL_COMMUNITY): Payer: Self-pay | Admitting: Emergency Medicine

## 2018-03-05 ENCOUNTER — Other Ambulatory Visit: Payer: Self-pay

## 2018-03-05 DIAGNOSIS — S81802A Unspecified open wound, left lower leg, initial encounter: Secondary | ICD-10-CM | POA: Diagnosis not present

## 2018-03-05 DIAGNOSIS — N184 Chronic kidney disease, stage 4 (severe): Secondary | ICD-10-CM | POA: Diagnosis present

## 2018-03-05 DIAGNOSIS — E1122 Type 2 diabetes mellitus with diabetic chronic kidney disease: Secondary | ICD-10-CM | POA: Diagnosis not present

## 2018-03-05 DIAGNOSIS — D649 Anemia, unspecified: Secondary | ICD-10-CM | POA: Diagnosis not present

## 2018-03-05 DIAGNOSIS — R079 Chest pain, unspecified: Secondary | ICD-10-CM | POA: Diagnosis not present

## 2018-03-05 DIAGNOSIS — S81812A Laceration without foreign body, left lower leg, initial encounter: Secondary | ICD-10-CM

## 2018-03-05 DIAGNOSIS — I5032 Chronic diastolic (congestive) heart failure: Secondary | ICD-10-CM | POA: Insufficient documentation

## 2018-03-05 DIAGNOSIS — Z794 Long term (current) use of insulin: Secondary | ICD-10-CM | POA: Insufficient documentation

## 2018-03-05 DIAGNOSIS — M79661 Pain in right lower leg: Secondary | ICD-10-CM | POA: Diagnosis present

## 2018-03-05 DIAGNOSIS — M79662 Pain in left lower leg: Secondary | ICD-10-CM | POA: Diagnosis not present

## 2018-03-05 DIAGNOSIS — D631 Anemia in chronic kidney disease: Secondary | ICD-10-CM | POA: Diagnosis present

## 2018-03-05 DIAGNOSIS — Y92238 Other place in hospital as the place of occurrence of the external cause: Secondary | ICD-10-CM | POA: Diagnosis not present

## 2018-03-05 DIAGNOSIS — IMO0001 Reserved for inherently not codable concepts without codable children: Secondary | ICD-10-CM

## 2018-03-05 DIAGNOSIS — W228XXA Striking against or struck by other objects, initial encounter: Secondary | ICD-10-CM | POA: Insufficient documentation

## 2018-03-05 DIAGNOSIS — I130 Hypertensive heart and chronic kidney disease with heart failure and stage 1 through stage 4 chronic kidney disease, or unspecified chronic kidney disease: Secondary | ICD-10-CM | POA: Diagnosis not present

## 2018-03-05 DIAGNOSIS — R072 Precordial pain: Secondary | ICD-10-CM | POA: Insufficient documentation

## 2018-03-05 DIAGNOSIS — R600 Localized edema: Secondary | ICD-10-CM | POA: Diagnosis not present

## 2018-03-05 DIAGNOSIS — R609 Edema, unspecified: Secondary | ICD-10-CM

## 2018-03-05 DIAGNOSIS — N189 Chronic kidney disease, unspecified: Secondary | ICD-10-CM

## 2018-03-05 DIAGNOSIS — Y998 Other external cause status: Secondary | ICD-10-CM | POA: Diagnosis not present

## 2018-03-05 DIAGNOSIS — Y9389 Activity, other specified: Secondary | ICD-10-CM | POA: Insufficient documentation

## 2018-03-05 DIAGNOSIS — E118 Type 2 diabetes mellitus with unspecified complications: Secondary | ICD-10-CM

## 2018-03-05 DIAGNOSIS — I10 Essential (primary) hypertension: Secondary | ICD-10-CM | POA: Diagnosis present

## 2018-03-05 LAB — BASIC METABOLIC PANEL
Anion gap: 14 (ref 5–15)
BUN: 64 mg/dL — AB (ref 6–20)
CALCIUM: 9.5 mg/dL (ref 8.9–10.3)
CO2: 22 mmol/L (ref 22–32)
CREATININE: 3.44 mg/dL — AB (ref 0.44–1.00)
Chloride: 103 mmol/L (ref 101–111)
GFR, EST AFRICAN AMERICAN: 13 mL/min — AB (ref >60)
GFR, EST NON AFRICAN AMERICAN: 11 mL/min — AB (ref >60)
Glucose, Bld: 161 mg/dL — ABNORMAL HIGH (ref 65–99)
Potassium: 4.5 mmol/L (ref 3.5–5.1)
SODIUM: 139 mmol/L (ref 135–145)

## 2018-03-05 LAB — CBC WITH DIFFERENTIAL/PLATELET
BASOS ABS: 0 10*3/uL (ref 0.0–0.1)
Basophils Relative: 0 %
EOS ABS: 0.1 10*3/uL (ref 0.0–0.7)
Eosinophils Relative: 4 %
HCT: 22 % — ABNORMAL LOW (ref 36.0–46.0)
Hemoglobin: 6.6 g/dL — CL (ref 12.0–15.0)
LYMPHS ABS: 0.6 10*3/uL — AB (ref 0.7–4.0)
Lymphocytes Relative: 18 %
MCH: 29.2 pg (ref 26.0–34.0)
MCHC: 30 g/dL (ref 30.0–36.0)
MCV: 97.3 fL (ref 78.0–100.0)
MONO ABS: 0.3 10*3/uL (ref 0.1–1.0)
Monocytes Relative: 10 %
NEUTROS PCT: 68 %
Neutro Abs: 2.4 10*3/uL (ref 1.7–7.7)
PLATELETS: 171 10*3/uL (ref 150–400)
RBC: 2.26 MIL/uL — AB (ref 3.87–5.11)
RDW: 13.2 % (ref 11.5–15.5)
WBC: 3.4 10*3/uL — AB (ref 4.0–10.5)

## 2018-03-05 LAB — RETICULOCYTES
RBC.: 2.33 MIL/uL — ABNORMAL LOW (ref 3.87–5.11)
RETIC CT PCT: 2.3 % (ref 0.4–3.1)
Retic Count, Absolute: 53.6 10*3/uL (ref 19.0–186.0)

## 2018-03-05 LAB — GLUCOSE, CAPILLARY: GLUCOSE-CAPILLARY: 258 mg/dL — AB (ref 65–99)

## 2018-03-05 LAB — POC OCCULT BLOOD, ED: FECAL OCCULT BLD: NEGATIVE

## 2018-03-05 LAB — TROPONIN I

## 2018-03-05 LAB — PROTIME-INR
INR: 1.1
PROTHROMBIN TIME: 14.1 s (ref 11.4–15.2)

## 2018-03-05 LAB — CBG MONITORING, ED: Glucose-Capillary: 158 mg/dL — ABNORMAL HIGH (ref 65–99)

## 2018-03-05 LAB — PREPARE RBC (CROSSMATCH)

## 2018-03-05 LAB — TSH: TSH: 3.825 u[IU]/mL (ref 0.350–4.500)

## 2018-03-05 MED ORDER — HYDRALAZINE HCL 25 MG PO TABS
50.0000 mg | ORAL_TABLET | Freq: Every day | ORAL | Status: DC
  Administered 2018-03-05 – 2018-03-06 (×2): 50 mg via ORAL
  Filled 2018-03-05 (×2): qty 2

## 2018-03-05 MED ORDER — ISOSORBIDE MONONITRATE ER 60 MG PO TB24
120.0000 mg | ORAL_TABLET | Freq: Every day | ORAL | Status: DC
  Administered 2018-03-05 – 2018-03-07 (×3): 120 mg via ORAL
  Filled 2018-03-05 (×6): qty 2

## 2018-03-05 MED ORDER — LEVOTHYROXINE SODIUM 50 MCG PO TABS
50.0000 ug | ORAL_TABLET | Freq: Every day | ORAL | Status: DC
Start: 2018-03-06 — End: 2018-03-07
  Administered 2018-03-06 – 2018-03-07 (×2): 50 ug via ORAL
  Filled 2018-03-05 (×2): qty 1

## 2018-03-05 MED ORDER — SODIUM CHLORIDE 0.9% FLUSH
3.0000 mL | Freq: Two times a day (BID) | INTRAVENOUS | Status: DC
  Administered 2018-03-05 – 2018-03-07 (×4): 3 mL via INTRAVENOUS

## 2018-03-05 MED ORDER — ROSUVASTATIN CALCIUM 10 MG PO TABS
10.0000 mg | ORAL_TABLET | Freq: Every day | ORAL | Status: DC
  Administered 2018-03-06: 10 mg via ORAL
  Filled 2018-03-05: qty 1

## 2018-03-05 MED ORDER — CARVEDILOL 12.5 MG PO TABS
12.5000 mg | ORAL_TABLET | Freq: Two times a day (BID) | ORAL | Status: DC
  Administered 2018-03-05 – 2018-03-07 (×4): 12.5 mg via ORAL
  Filled 2018-03-05 (×4): qty 1

## 2018-03-05 MED ORDER — ONDANSETRON HCL 4 MG/2ML IJ SOLN: 4.0000 mg | Freq: Four times a day (QID) | INTRAMUSCULAR | Status: DC | PRN

## 2018-03-05 MED ORDER — CLONIDINE HCL 0.2 MG PO TABS
0.2000 mg | ORAL_TABLET | Freq: Three times a day (TID) | ORAL | Status: DC
  Administered 2018-03-05 – 2018-03-07 (×5): 0.2 mg via ORAL
  Filled 2018-03-05 (×5): qty 1

## 2018-03-05 MED ORDER — TORSEMIDE 20 MG PO TABS
20.0000 mg | ORAL_TABLET | Freq: Two times a day (BID) | ORAL | Status: DC
  Administered 2018-03-06 – 2018-03-07 (×3): 20 mg via ORAL
  Filled 2018-03-05 (×3): qty 1

## 2018-03-05 MED ORDER — HYDRALAZINE HCL 20 MG/ML IJ SOLN
10.0000 mg | Freq: Three times a day (TID) | INTRAMUSCULAR | Status: DC | PRN
  Administered 2018-03-07: 10 mg via INTRAVENOUS
  Filled 2018-03-05: qty 1

## 2018-03-05 MED ORDER — PANTOPRAZOLE SODIUM 40 MG PO TBEC
40.0000 mg | DELAYED_RELEASE_TABLET | Freq: Every day | ORAL | Status: DC
  Administered 2018-03-05 – 2018-03-07 (×3): 40 mg via ORAL
  Filled 2018-03-05 (×3): qty 1

## 2018-03-05 MED ORDER — ACETAMINOPHEN 325 MG PO TABS: 650.0000 mg | ORAL_TABLET | Freq: Four times a day (QID) | ORAL | Status: DC | PRN

## 2018-03-05 MED ORDER — SODIUM CHLORIDE 0.9 % IV SOLN
Freq: Once | INTRAVENOUS | Status: AC
  Administered 2018-03-05: via INTRAVENOUS

## 2018-03-05 MED ORDER — NITROGLYCERIN 0.4 MG SL SUBL: 0.4000 mg | SUBLINGUAL_TABLET | SUBLINGUAL | Status: DC | PRN

## 2018-03-05 NOTE — ED Provider Notes (Addendum)
[facility] EMERGENCY DEPARTMENT Provider Note   CSN: [id number] Arrival date & time: 03/05/18  1426     History   Chief Complaint Chief Complaint  Patient presents with  . Leg Pain  . Chest Pain    HPI [name] is a 84 y.o. female.  She is here complaining of weakness in her legs and inability to walk.  She states for the last 3 weeks her ankles and feet have been swollen and painful.  She also shows me a bleeding skin tear on her left lower leg.  She does not recall any trauma.  She states she has had some chest discomfort with activity intermittently but none now.  She denies any fevers or cough or lightheadedness or abdominal pain or urinary symptoms.  Is to go to her PCP on Thursday but could not wait.  She denies any vomiting of blood or passing any blood from her bottom or urinating.  The history is provided by the patient.  Leg Pain   The current episode started more than 1 week ago. The problem occurs constantly. The problem has been gradually worsening. The pain is present in the left lower leg and right lower leg. The pain is moderate. Associated symptoms include stiffness. The symptoms are aggravated by standing. She has tried nothing for the symptoms. The treatment provided no relief. There has been no history of extremity trauma.  Chest Pain   This is a recurrent problem. The current episode started 3 to 5 hours ago. The problem occurs daily. The problem has not changed since onset.The pain is associated with exertion. The pain is present in the substernal region. The pain is mild. The quality of the pain is described as brief. The pain does not radiate. Associated symptoms include leg pain and weakness. Pertinent negatives include no abdominal pain, no dizziness, no fever, no hemoptysis, no shortness of breath and no vomiting. She has tried nothing for the symptoms.    Past Medical History:  Diagnosis Date  . CAD (coronary artery disease) 2003   Last catheterization  2008. Two-vessel PCI of the first OM branch and mid LAD.  Marland Kitchen CKD (chronic kidney disease)   . Congestive heart failure ([place]) 02/2017   grade 2 diastolic dysfunction  . CVA (cerebral vascular accident) ([place]) [place]   . Dementia   . DJD (degenerative joint disease) of lumbar spine   . Hypercholesteremia   . Hypertension   . IDDM (insulin dependent diabetes mellitus) [facility])     Patient Active Problem List   Diagnosis Date Noted  . NSTEMI (non-ST elevated myocardial infarction) ([place]) 12/22/2017  . Acute encephalopathy 10/15/2017  . Acute renal failure superimposed on stage 4 chronic kidney disease ([place]) 09/22/2017  . CAD (coronary artery disease), native coronary artery 09/21/2017  . Pleural effusion, bilateral   . Thrombocytopenia ([place]) 09/20/2017  . Abnormal CT scan of lung   . Chest pain 03/04/2017  . Esophagitis   . Occult blood in stools 11/29/2016  . UTI (urinary tract infection) 11/29/2016  . Urinary retention 11/29/2016  . PAF (paroxysmal atrial fibrillation) ([place])   . Pressure injury of skin 11/22/2016  . Aortic valve sclerosis-2D June 2016 10/05/2016  . Chest pain with high risk of acute coronary syndrome 10/05/2016  . 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 Pulmonary hypertension ([place])   . Insulin dependent diabetes mellitus with complications ([place])   . Chronic kidney disease, stage IV (severe) ([place])   . Other constipation   .  Failure to thrive in adult   . Hypomagnesemia   . CAD S/P PCI '03 and '08 05/11/2015  . Anemia in chronic kidney disease 05/11/2015  . Benign paroxysmal positional vertigo 03/21/2014  . HTN (hypertension) 08/09/2013  . Lumbar spondylosis 05/16/2013  . Overweight 05/15/2013  . Varicose veins of lower extremities with other complications 78/58/8502  . Normocytic anemia 10/29/2012  . Hypoglycemia 10/29/2012  . NSVT (nonsustained ventricular tachycardia) ([place]) 10/28/2012  . OA (osteoarthritis) 10/27/2012  . Cerebrovascular disease  03/09/2010  . Hyperlipidemia 12/05/2008    Past Surgical History:  Procedure Laterality Date  . APPENDECTOMY    . KNEE SURGERY       OB History    Gravida      Para      Term      Preterm      AB      Living  5     SAB      TAB      Ectopic      Multiple      Live Births               Home Medications    Prior to Admission medications   Medication Sig Start Date End Date Taking? Authorizing Provider  aspirin EC 81 MG EC tablet Take 1 tablet (81 mg total) by mouth daily. 10/08/16   Leanor Kail, PA  carvedilol (COREG) 12.5 MG tablet Take 1 tablet (12.5 mg total) by mouth 2 (two) times daily with a meal. 01/02/18   Timmothy Euler, MD  cholecalciferol (VITAMIN D) 1000 units tablet Take 1,000 Units by mouth daily.    [provider]  cloNIDine (CATAPRES) 0.1 MG tablet Take 2 tablets (0.2 mg total) by mouth 3 (three) times daily. 01/30/18   Timmothy Euler, MD  clopidogrel (PLAVIX) 75 MG tablet Take 1 tablet (75 mg total) by mouth daily. 02/07/18   Timmothy Euler, MD  diclofenac sodium (VOLTAREN) 1 % GEL Apply 4 g topically 4 (four) times daily. Patient taking differently: Apply 4 g topically 4 (four) times daily as needed (FOR PAIN).  07/29/16   Timmothy Euler, MD  feeding supplement, ENSURE ENLIVE, (ENSURE ENLIVE) LIQD Take 237 mLs by mouth 2 (two) times daily between meals. 12/25/17   Dhungel, Nishant, MD  glucose blood (ACCU-CHEK AVIVA PLUS) test strip CHECK BLOOD SUGER UP TO 3 TIMES A DAY 01/02/18   Timmothy Euler, MD  hydrALAZINE (APRESOLINE) 50 MG tablet Take 1 tablet (50 mg total) by mouth daily. 02/07/18   Timmothy Euler, MD  HYDROcodone-acetaminophen (NORCO) 5-325 MG tablet Take 0.5-1 tablets every 6 (six) hours as needed by mouth for moderate pain. 10/09/17   Timmothy Euler, MD  insulin NPH Human (NOVOLIN N) 100 UNIT/ML injection Inject 0.1 mLs (10 Units total) into the skin 2 (two) times daily before a meal. 03/01/18    Timmothy Euler, MD  isosorbide mononitrate (IMDUR) 120 MG 24 hr tablet Take 1 tablet (120 mg total) by mouth daily. 11/27/17   Charlynne Cousins, MD  isosorbide mononitrate (IMDUR) 120 MG 24 hr tablet TAKE 1 TABLET DAILY 02/12/18   Timmothy Euler, MD  levothyroxine (SYNTHROID, LEVOTHROID) 50 MCG tablet TAKE 1 TABLET DAILY 04/03/17   Timmothy Euler, MD  nitroGLYCERIN (NITROSTAT) 0.4 MG SL tablet PLACE 1 TABLET UNDER THE TONGUE AT ONSET OF CHEST PAIN EVERY 5 MINTUES UP TO 3 TIMES AS NEEDED 02/27/17   Timmothy Euler, MD  pantoprazole (PROTONIX) 40 MG tablet TAKE 1 TABLET DAILY 01/15/18   Timmothy Euler, MD  polyethylene glycol powder (GLYCOLAX/MIRALAX) powder MIX 1 CAPFUL (17 GRAMS) WITH 8OZ OF WATER OR JUICE DAILY. Patient taking differently: MIX 1 CAPFUL (17 GRAMS) WITH 8OZ OF WATER OR JUICE DAILY AS NEEDED FOR CONSTIPATION 12/13/16   Timmothy Euler, MD  rosuvastatin (CRESTOR) 10 MG tablet Take 1 tablet (10 mg total) by mouth daily. 01/02/18   Timmothy Euler, MD  sodium polystyrene (KAYEXALATE) 15 GM/60ML suspension Take 60 mLs (15 g total) by mouth daily. Patient taking differently: Take 15 g by mouth daily as needed.  03/17/17   Timmothy Euler, MD  sucralfate (CARAFATE) 1 GM/10ML suspension Take 10 mLs (1 g total) by mouth every 6 (six) hours. Patient taking differently: Take 1 g by mouth daily as needed.  03/27/17   Timmothy Euler, MD  torsemide (DEMADEX) 20 MG tablet Take 1 tablet (20 mg total) by mouth 2 (two) times daily. 01/15/18   Timmothy Euler, MD    Family History Family History  Problem Relation Age of Onset  . Heart attack Mother 71  . Heart attack Father 14  . Diabetes Brother        RETINOPATHY   . Drug abuse Brother   . Liver cancer Brother   . Breast cancer Daughter   . Diabetes Sister     Social History Social History   Tobacco Use  . Smoking status: Never Smoker  . Smokeless tobacco: Never Used  Substance Use Topics  . Alcohol use:  No  . Drug use: No     Allergies   Propoxyphene n-acetaminophen and Simvastatin   Review of Systems Review of Systems  Constitutional: Negative for chills and fever.  HENT: Negative for sore throat.   Eyes: Negative for pain and visual disturbance.  Respiratory: Negative for hemoptysis and shortness of breath.   Cardiovascular: Positive for chest pain.  Gastrointestinal: Negative for abdominal pain and vomiting.  Genitourinary: Negative for dysuria.  Musculoskeletal: Positive for gait problem, joint swelling and stiffness.  Skin: Negative for rash.  Neurological: Positive for weakness. Negative for dizziness and speech difficulty.     Physical Exam Updated Vital Signs BP (!) 169/51 (BP Location: Right Arm)   Pulse (!) 58   Temp 98.5 F (36.9 C) (Oral)   Resp 18   SpO2 96%   Physical Exam  Constitutional: She appears well-developed and well-nourished. No distress.  HENT:  Head: Normocephalic and atraumatic.  Eyes: Conjunctivae are normal.  Neck: Neck supple.  Cardiovascular: Normal rate and regular rhythm.  Murmur heard.  Diastolic murmur is present with a grade of 3/6. Pulmonary/Chest: Effort normal and breath sounds normal. No respiratory distress.  Abdominal: Soft. There is no tenderness.  Musculoskeletal: Normal range of motion. She exhibits edema (3+ bilat to mid calf) and tenderness. She exhibits no deformity.  Neurological: She is alert.  Skin: Skin is warm and dry.  She is got some bruising on her lower extremities and a skin tear with some oozing blood on the left anterior shin.  Psychiatric: She has a normal mood and affect.  Nursing note and vitals reviewed.    ED Treatments / Results  Labs (all labs ordered are listed, but only abnormal results are displayed) Labs Reviewed  BASIC METABOLIC PANEL - Abnormal; Notable for the following components:      Result Value   Glucose, Bld 161 (*)    BUN 64 (*)  Creatinine, Ser 3.44 (*)    GFR calc non Af  Amer 11 (*)    GFR calc Af Amer 13 (*)    All other components within normal limits  CBC WITH DIFFERENTIAL/PLATELET - Abnormal; Notable for the following components:   WBC 3.4 (*)    RBC 2.26 (*)    Hemoglobin 6.6 (*)    HCT 22.0 (*)    All other components within normal limits  CBG MONITORING, ED - Abnormal; Notable for the following components:   Glucose-Capillary 158 (*)    All other components within normal limits  TROPONIN I    EKG EKG Interpretation  Date/Time:  Monday March 05 2018 14:53:38 EDT Ventricular Rate:  56 PR Interval:  220 QRS Duration: 114 QT Interval:  476 QTC Calculation: 459 R Axis:   0 Text Interpretation:  Sinus bradycardia with 1st degree A-V block Septal infarct , age undetermined Abnormal ECG improved t wave inversions from prior 1/19 Confirmed by Aletta Edouard [phone]) on 03/05/2018 5:04:49 PM   Radiology Dg Chest 2 View  Result Date: 03/05/2018 CLINICAL DATA:  Intermittent chest pain for month.  History of CHF. EXAM: CHEST - 2 VIEW COMPARISON:  Chest radiograph January 30, 2017 and CT chest March 04, 2017 FINDINGS: Cardiac silhouette is upper limits of normal in size. Calcified aortic arch. Mild chronic interstitial changes with bibasilar strandy densities in tiny pleural effusions. No focal consolidation. Similar nodular scarring versus confluence of vascular shadows projecting RIGHT upper lobe. No pneumothorax. Soft tissue planes included osseous structures are nonsuspicious. IMPRESSION: Stable borderline cardiomegaly. Stable chronic interstitial changes with tiny pleural effusions. Aortic Atherosclerosis (ICD10-I70.0). Electronically Signed   By: Elon Alas M.D.   On: 03/05/2018 15:12    Procedures .Critical Care Performed by: Hayden Rasmussen, MD Authorized by: Hayden Rasmussen, MD   Critical care provider statement:    Critical care time (minutes):  30   Critical care was necessary to treat or prevent imminent or life-threatening  deterioration of the following conditions:  Circulatory failure   Critical care was time spent personally by me on the following activities:  Development of treatment plan with patient or surrogate, discussions with consultants, evaluation of patient's response to treatment, examination of patient, obtaining history from patient or surrogate, ordering and performing treatments and interventions, ordering and review of laboratory studies, ordering and review of radiographic studies, pulse oximetry, re-evaluation of patient's condition and review of old charts   I assumed direction of critical care for this patient from another provider in my specialty: no     (including critical care time)  Medications Ordered in ED Medications - No data to display   Initial Impression / Assessment and Plan / ED Course  I have reviewed the triage vital signs and the nursing notes.  Pertinent labs & imaging results that were available during my care of the patient were reviewed by me and considered in my medical decision making (see chart for details).  Clinical Course as of Mar 07 1022  Mon Mar 05, 2018  1824 Discussed with the patient regarding her anemia.  She states she has had multiple transfusions in the past.  She is unclear why she needs transfusion dense and does not recall any overt bleeding.  I discussed with the triad hospitalist Dr. Aggie Moats who will accept his service.   [MB]    Clinical Course User Index [MB] Hayden Rasmussen, MD     Final Clinical Impressions(s) / ED Diagnoses  Final diagnoses:  Anemia, unspecified type  Peripheral edema  Skin tear of left lower leg without complication, initial encounter    ED Discharge Orders    None       Hayden Rasmussen, MD 03/07/18 1026    Hayden Rasmussen, MD 03/16/18 1050

## 2018-03-05 NOTE — ED Notes (Signed)
EDP at bedside  

## 2018-03-05 NOTE — H&P (Signed)
[facility] History and Physical  PRIYANA MCCAREY DJT:[id number] DOB: [birth year] DOA: 03/05/2018  Referring physician:  PCP: Timmothy Euler, MD   Chief Complaint: Weakness  HPI: [name] is a 84 y.o. female past medical history significant for CKD, CVA and dementia presents with weakness. States she has felt weak for a couple of days.  No cough, fever, nausea, vomiting, diarrhea.  Denies having history of bleed.  Denies any melena, hematuria, rectal bleed.  No easy bruising.  She is not on iron supplements.  ED Course: To be anemic.  Guaiac negative to the emergency room.  Hospitalist consulted for admission.  Review of Systems:  As per HPI otherwise 10 point review of systems negative.    Past Medical History:  Diagnosis Date  . CAD (coronary artery disease) 2003   Last catheterization 2008. Two-vessel PCI of the first OM branch and mid LAD.  Marland Kitchen CKD (chronic kidney disease)   . Congestive heart failure ([place]) 02/2017   grade 2 diastolic dysfunction  . CVA (cerebral vascular accident) ([place]) [place]   . Dementia   . DJD (degenerative joint disease) of lumbar spine   . Hypercholesteremia   . Hypertension   . IDDM (insulin dependent diabetes mellitus) ([place])    Past Surgical History:  Procedure Laterality Date  . APPENDECTOMY    . KNEE SURGERY     Social History:  reports that she has never smoked. She has never used smokeless tobacco. She reports that she does not drink alcohol or use drugs.  Allergies  Allergen Reactions  . Propoxyphene N-Acetaminophen     Pt does not know reaction  . Simvastatin Other (See Comments)    headache    Family History  Problem Relation Age of Onset  . Heart attack Mother 22  . Heart attack Father 66  . Diabetes Brother        RETINOPATHY   . Drug abuse Brother   . Liver cancer Brother   . Breast cancer Daughter   . Diabetes Sister      Prior to Admission medications   Medication Sig Start Date End Date  Taking? Authorizing Provider  aspirin EC 81 MG EC tablet Take 1 tablet (81 mg total) by mouth daily. 10/08/16   Leanor Kail, PA  carvedilol (COREG) 12.5 MG tablet Take 1 tablet (12.5 mg total) by mouth 2 (two) times daily with a meal. 01/02/18   Timmothy Euler, MD  cholecalciferol (VITAMIN D) 1000 units tablet Take 1,000 Units by mouth daily.    [provider]  cloNIDine (CATAPRES) 0.1 MG tablet Take 2 tablets (0.2 mg total) by mouth 3 (three) times daily. 01/30/18   Timmothy Euler, MD  clopidogrel (PLAVIX) 75 MG tablet Take 1 tablet (75 mg total) by mouth daily. 02/07/18   Timmothy Euler, MD  diclofenac sodium (VOLTAREN) 1 % GEL Apply 4 g topically 4 (four) times daily. Patient taking differently: Apply 4 g topically 4 (four) times daily as needed (FOR PAIN).  07/29/16   Timmothy Euler, MD  feeding supplement, ENSURE ENLIVE, (ENSURE ENLIVE) LIQD Take 237 mLs by mouth 2 (two) times daily between meals. 12/25/17   Dhungel, Nishant, MD  glucose blood (ACCU-CHEK AVIVA PLUS) test strip CHECK BLOOD SUGER UP TO 3 TIMES A DAY 01/02/18   Timmothy Euler, MD  hydrALAZINE (APRESOLINE) 50 MG tablet Take 1 tablet (50 mg total) by mouth daily. 02/07/18   Timmothy Euler, MD  HYDROcodone-acetaminophen (NORCO) 5-325 MG tablet Take 0.5-1 tablets every 6 (six) hours as needed by mouth for moderate pain. 10/09/17   Timmothy Euler, MD  insulin NPH Human (NOVOLIN N) 100 UNIT/ML injection Inject 0.1 mLs (10 Units total) into the skin 2 (two) times daily before a meal. 03/01/18   Timmothy Euler, MD  isosorbide mononitrate (IMDUR) 120 MG 24 hr tablet Take 1 tablet (120 mg total) by mouth daily. 11/27/17   Charlynne Cousins, MD  isosorbide mononitrate (IMDUR) 120 MG 24 hr tablet TAKE 1 TABLET DAILY 02/12/18   Timmothy Euler, MD  levothyroxine (SYNTHROID, LEVOTHROID) 50 MCG tablet TAKE 1 TABLET DAILY 04/03/17   Timmothy Euler, MD  nitroGLYCERIN (NITROSTAT) 0.4 MG SL tablet PLACE 1  TABLET UNDER THE TONGUE AT ONSET OF CHEST PAIN EVERY 5 MINTUES UP TO 3 TIMES AS NEEDED 02/27/17   Timmothy Euler, MD  pantoprazole (PROTONIX) 40 MG tablet TAKE 1 TABLET DAILY 01/15/18   Timmothy Euler, MD  polyethylene glycol powder (GLYCOLAX/MIRALAX) powder MIX 1 CAPFUL (17 GRAMS) WITH 8OZ OF WATER OR JUICE DAILY. Patient taking differently: MIX 1 CAPFUL (17 GRAMS) WITH 8OZ OF WATER OR JUICE DAILY AS NEEDED FOR CONSTIPATION 12/13/16   Timmothy Euler, MD  rosuvastatin (CRESTOR) 10 MG tablet Take 1 tablet (10 mg total) by mouth daily. 01/02/18   Timmothy Euler, MD  sodium polystyrene (KAYEXALATE) 15 GM/60ML suspension Take 60 mLs (15 g total) by mouth daily. Patient taking differently: Take 15 g by mouth daily as needed.  03/17/17   Timmothy Euler, MD  sucralfate (CARAFATE) 1 GM/10ML suspension Take 10 mLs (1 g total) by mouth every 6 (six) hours. Patient taking differently: Take 1 g by mouth daily as needed.  03/27/17   Timmothy Euler, MD  torsemide (DEMADEX) 20 MG tablet Take 1 tablet (20 mg total) by mouth 2 (two) times daily. 01/15/18   Timmothy Euler, MD   Physical Exam: Vitals:   03/05/18 1754 03/05/18 1849 03/05/18 1900 03/05/18 1930  BP: (!) 182/54 (!) 196/56 (!) 187/54 (!) 194/55  Pulse: (!) 57 (!) 56 (!) 55 (!) 55  Resp: 12 15 12 12   Temp:      TempSrc:      SpO2: 98% 96% 97% 98%    Wt Readings from Last 3 Encounters:  01/30/18 64.3 kg (141 lb 12.8 oz)  01/02/18 62.1 kg (137 lb)  12/25/17 62.2 kg (137 lb 2 oz)    General:  Appears calm and comfortable; a&Ox3 Eyes:  PERRL, EOMI, normal lids, iris ENT:  grossly normal hearing, lips & tongue Neck:  no LAD, masses or thyromegaly Cardiovascular:  RRR, no m/r/g. No LE edema.  Respiratory:  CTA bilaterally, no w/r/r. Normal respiratory effort. Abdomen:  soft, ntnd Skin:  no rash or induration seen on limited exam Musculoskeletal:  grossly normal tone BUE/BLE Psychiatric:  grossly normal mood and affect,  speech fluent and appropriate Neurologic:  CN 2-12 grossly intact, moves all extremities in coordinated fashion.          Labs on Admission:  Basic Metabolic Panel: Recent Labs  Lab 03/05/18 1532  NA 139  K 4.5  CL 103  CO2 22  GLUCOSE 161*  BUN 64*  CREATININE 3.44*  CALCIUM 9.5   Liver Function Tests: No results for input(s): AST, ALT, ALKPHOS, BILITOT, PROT, ALBUMIN in the last 168 hours. No results for input(s): LIPASE, AMYLASE in the last 168 hours. No results for  input(s): AMMONIA in the last 168 hours. CBC: Recent Labs  Lab 03/05/18 1532  WBC 3.4*  NEUTROABS 2.4  HGB 6.6*  HCT 22.0*  MCV 97.3  PLT 171   Cardiac Enzymes: Recent Labs  Lab 03/05/18 1532  TROPONINI <0.03    BNP (last 3 results) Recent Labs    09/20/17 1940  BNP 1,243.0*    ProBNP (last 3 results) No results for input(s): PROBNP in the last 8760 hours.   Creatinine clearance cannot be calculated (Unknown ideal weight.)  CBG: Recent Labs  Lab 03/05/18 1453  GLUCAP 158*    Radiological Exams on Admission: Dg Chest 2 View  Result Date: 03/05/2018 CLINICAL DATA:  Intermittent chest pain for month.  History of CHF. EXAM: CHEST - 2 VIEW COMPARISON:  Chest radiograph January 30, 2017 and CT chest March 04, 2017 FINDINGS: Cardiac silhouette is upper limits of normal in size. Calcified aortic arch. Mild chronic interstitial changes with bibasilar strandy densities in tiny pleural effusions. No focal consolidation. Similar nodular scarring versus confluence of vascular shadows projecting RIGHT upper lobe. No pneumothorax. Soft tissue planes included osseous structures are nonsuspicious. IMPRESSION: Stable borderline cardiomegaly. Stable chronic interstitial changes with tiny pleural effusions. Aortic Atherosclerosis (ICD10-I70.0). Electronically Signed   By: Elon Alas M.D.   On: 03/05/2018 15:12    EKG: Independently reviewed. 1st Deg AV block.  Assessment/Plan Active Problems:    Acute anemia  Anemia Most recent hemoglobin Pos History of anemia Serial H&H IV fluids History of bleed no GI consult not ordered Avoid NSAIDs Hold ASA, plavix Anemia panel This is likely due ot pt's renal diz which is not followed by nephro per pt due to transportation issues  CHF Cont Coreg, imdur Hold demadex  HTN Cont clonidine PO hydralaizine Prn IV hydralazine  Hypothyroidism Cont OP synthroid 50 mcg qd  CAD Prn ntg sl Hold asa and plavix  CKD Monitor Cr daily Cr at baseline,  3.4-3.8 Consider nephro consult in AM  Hyperlipidemia Continue statin   Code Status: FC DVT Prophylaxis: SCD Family Communication: none available Disposition Plan: Pending Improvement  Status: tele, obs  Elwin Mocha, MD Family Medicine [facility] [email or web address] Password [email or web address]

## 2018-03-05 NOTE — ED Notes (Signed)
Veranda (pt's daughter)  H: [phone] C: [phone]

## 2018-03-05 NOTE — ED Notes (Signed)
CRITICAL VALUE ALERT  Critical Value:  HGB 6.6  Date & Time Notied:  03-05-18 1645  Provider Notified: Wentz,MD  Orders Received/Actions taken: monitor

## 2018-03-05 NOTE — ED Notes (Signed)
Patient has skin tear to lower left leg, patient stated she obtain it while in x-ray, cleaned leg and applied dressing.

## 2018-03-05 NOTE — ED Triage Notes (Signed)
Pt complaining of bilateral leg swelling for one month along with chest pain intermittent for one month ago.

## 2018-03-06 ENCOUNTER — Ambulatory Visit: Payer: Medicare Other | Admitting: Family Medicine

## 2018-03-06 DIAGNOSIS — E118 Type 2 diabetes mellitus with unspecified complications: Secondary | ICD-10-CM | POA: Diagnosis not present

## 2018-03-06 DIAGNOSIS — D649 Anemia, unspecified: Secondary | ICD-10-CM | POA: Diagnosis not present

## 2018-03-06 DIAGNOSIS — I10 Essential (primary) hypertension: Secondary | ICD-10-CM

## 2018-03-06 DIAGNOSIS — N184 Chronic kidney disease, stage 4 (severe): Secondary | ICD-10-CM | POA: Diagnosis not present

## 2018-03-06 DIAGNOSIS — D631 Anemia in chronic kidney disease: Secondary | ICD-10-CM | POA: Diagnosis not present

## 2018-03-06 DIAGNOSIS — Z794 Long term (current) use of insulin: Secondary | ICD-10-CM | POA: Diagnosis not present

## 2018-03-06 DIAGNOSIS — E1129 Type 2 diabetes mellitus with other diabetic kidney complication: Secondary | ICD-10-CM | POA: Diagnosis not present

## 2018-03-06 LAB — CBC
HCT: 27.4 % — ABNORMAL LOW (ref 36.0–46.0)
Hemoglobin: 8.6 g/dL — ABNORMAL LOW (ref 12.0–15.0)
MCH: 29.4 pg (ref 26.0–34.0)
MCHC: 31.4 g/dL (ref 30.0–36.0)
MCV: 93.5 fL (ref 78.0–100.0)
PLATELETS: 130 10*3/uL — AB (ref 150–400)
RBC: 2.93 MIL/uL — AB (ref 3.87–5.11)
RDW: 14.3 % (ref 11.5–15.5)
WBC: 3.4 10*3/uL — AB (ref 4.0–10.5)

## 2018-03-06 LAB — BASIC METABOLIC PANEL
Anion gap: 12 (ref 5–15)
BUN: 61 mg/dL — ABNORMAL HIGH (ref 6–20)
CHLORIDE: 102 mmol/L (ref 101–111)
CO2: 25 mmol/L (ref 22–32)
CREATININE: 3.35 mg/dL — AB (ref 0.44–1.00)
Calcium: 9.3 mg/dL (ref 8.9–10.3)
GFR calc non Af Amer: 12 mL/min — ABNORMAL LOW (ref >60)
GFR, EST AFRICAN AMERICAN: 14 mL/min — AB (ref >60)
Glucose, Bld: 211 mg/dL — ABNORMAL HIGH (ref 65–99)
Potassium: 4.2 mmol/L (ref 3.5–5.1)
SODIUM: 139 mmol/L (ref 135–145)

## 2018-03-06 LAB — GLUCOSE, CAPILLARY
GLUCOSE-CAPILLARY: 238 mg/dL — AB (ref 65–99)
Glucose-Capillary: 186 mg/dL — ABNORMAL HIGH (ref 65–99)
Glucose-Capillary: 113 mg/dL — ABNORMAL HIGH (ref 65–99)

## 2018-03-06 LAB — IRON AND TIBC
IRON: 63 ug/dL (ref 28–170)
Saturation Ratios: 35 % — ABNORMAL HIGH (ref 10.4–31.8)
TIBC: 182 ug/dL — ABNORMAL LOW (ref 250–450)
UIBC: 119 ug/dL

## 2018-03-06 LAB — VITAMIN B12: Vitamin B-12: 268 pg/mL (ref 180–914)

## 2018-03-06 LAB — FERRITIN: FERRITIN: 359 ng/mL — AB (ref 11–307)

## 2018-03-06 LAB — FOLATE: FOLATE: 12.1 ng/mL (ref >5.9)

## 2018-03-06 MED ORDER — INSULIN ASPART 100 UNIT/ML ~~LOC~~ SOLN
0.0000 [IU] | Freq: Three times a day (TID) | SUBCUTANEOUS | Status: DC
  Administered 2018-03-06: 2 [IU] via SUBCUTANEOUS
  Administered 2018-03-07: 3 [IU] via SUBCUTANEOUS

## 2018-03-06 MED ORDER — INSULIN ASPART 100 UNIT/ML ~~LOC~~ SOLN
0.0000 [IU] | Freq: Every day | SUBCUTANEOUS | Status: DC
  Administered 2018-03-06: 2 [IU] via SUBCUTANEOUS

## 2018-03-06 MED ORDER — INSULIN GLARGINE 100 UNIT/ML ~~LOC~~ SOLN
10.0000 [IU] | Freq: Every day | SUBCUTANEOUS | Status: DC
  Administered 2018-03-06: 10 [IU] via SUBCUTANEOUS
  Filled 2018-03-06 (×2): qty 0.1

## 2018-03-06 NOTE — Care Management Note (Addendum)
Case Management Note  Patient Details  Name: [name] MRN: [id number] Date of Birth: 1932/02/11  Subjective/Objective:       Admitted with anemia. Pt from home, lives with daughter who works but checks on patient throughout the day. Pt has walker, bsc, lift chair and hospital bed. She is able to bath and dress herself but daughter prepares meals, manages medications and provides transportation to appointments.  She has used [facility] in the past, is not active PTA.            Action/Plan: DC home tomorrow with referral for [facility] services through [facility]. [facility] rep aware of referral and will pull pt info from chart. Daughter aware HH has 48 hrs to make first visit. CM has also made referral for [facility] CM services as pt has had 5 admissions in 6 months.   Expected Discharge Date:     03/07/2018             Expected Discharge Plan:  [place]  In-House Referral:  NA  Discharge planning Services  CM Consult  Post Acute Care Choice:  Home Health Choice offered to:  Adult Children  HH Arranged:  RN, PT [place] Agency:  [place]  Status of Service:  Completed, signed off  Sherald Barge, RN 03/06/2018, 1:58 PM

## 2018-03-06 NOTE — Consult Note (Signed)
   [facility] CM Inpatient Consult   03/06/2018  Renna Kilmer Lampe [birth year] [id number]   Referral received by inpatient case manager to assess for [facility] services. Spoke with inpatient care manger who advised this liaison to contact the daughter Iline Oven who is the patient's caregiver. Call placed to patient's daughter, no answer and unable to leave a voicemail. Placed a call to inpatient RN Horris Latino who confirmed daughter not at bedside, but wrote this liaison's number down to give to daughter when she returns. Will follow up tomorrow. For questions or concerns please contact:  Daxson Reffett RN, [facility] Liaison  [phone]) [employer] [phone]) Toll free office

## 2018-03-06 NOTE — Care Management Obs Status (Signed)
[place] NOTIFICATION   Patient Details  Name: [name] MRN: [id number] Date of Birth: Jan 26, 1932   Medicare Observation Status Notification Given:  Yes    Sherald Barge, RN 03/06/2018, 1:16 PM

## 2018-03-06 NOTE — Progress Notes (Addendum)
Progress Note    [name]  ASN:[id number] DOB: 1932-10-19  DOA: 03/05/2018 PCP: Timmothy Euler, MD    Brief Narrative:   Chief complaint: Follow-up weakness  Medical records reviewed and are as summarized below:  [name] is an 84 y.o. female with a PMH of stage IV CKD, GFR less than 20 at baseline, insulin-dependent diabetes, hypertension, HLD, dementia, history of stroke, CAD, and chronic diastolic CHF (last echo done 12/22/17 which showed an EF of 60-65% and grade 2 diastolic dysfunction) who was admitted 03/05/18 for evaluation of a 2-day history of weakness.  Hemoglobin was found to be 6.6 mg/dL upon workup in the ED.  Assessment/Plan:   Principal Problem:   Acute on chronic anemia/anemia of chronic kidney disease/symptomatic anemia Hemoglobin 6.6 mg/dL on admission and patient symptomatic with weakness.  Aspirin and Plavix held.  Given 2 units of PRBCs.  Posttransfusion hemoglobin 8.6 mg/dL.  Active Problems:   Hypothyroidism Continue Synthroid.    HLD Continue Crestor.    HTN (hypertension) Currently being managed with Coreg, Catapres, demadex, hydralazine, imdur.  Despite multiple medications, blood pressure suboptimally controlled.  Chest x-ray personally reviewed.  Relatively clear with some chronic interstitial changes, hyperexpanded lungs with flattened diaphragms.  Tiny pleural effusions.     Insulin dependent diabetes mellitus with complications (HCC) Takes NPH insulin at home.  This was not ordered on admission.  CBG is 158-258. Will start SSI, insulin sensitive scale and 10 units of Lantus at HS.    Chronic kidney disease, stage IV (severe) (HCC) Has advanced CKD.  Nephrology consultation requested and is pending at this time.  Body mass index is 23.69 kg/m.   Family Communication/Anticipated D/C date and plan/Code Status   DVT prophylaxis: SCDs ordered. Code Status: Full Code.  Family Communication: No family currently the  bedside. Disposition Plan: Home once cleared by nephrology.   Medical Consultants:    Nephrology   Anti-Infectives:    None  Subjective:   Patient reports that her presenting complaint was swelling to her feet and lower extremities.  She seems to have very poor insight into her medical condition and indicates that her family stopped taking her for her Aranesp injections as an outpatient.  Is not interested in dialysis, and complains about having to take too many medications.  Does not feel any different than she did yesterday per her report.  Objective:    Vitals:   03/06/18 0232 03/06/18 0306 03/06/18 0311 03/06/18 0525  BP: (!) 157/51 (!) 180/50 (!) 164/48 (!) 172/56  Pulse: (!) 52 (!) 53 (!) 51 (!) 54  Resp: 16 17 16 16   Temp: (!) 97.4 F (36.3 C) (!) 97.5 F (36.4 C)  98.2 F (36.8 C)  TempSrc: Oral Oral  Oral  SpO2: 98% 98% 99% 100%  Weight:      Height:        Intake/Output Summary (Last 24 hours) at 03/06/2018 0820 Last data filed at 03/06/2018 0523 Gross per 24 hour  Intake 1310.33 ml  Output 150 ml  Net 1160.33 ml   Filed Weights   03/05/18 2303  Weight: 68.6 kg (151 lb 3.8 oz)    Exam: General: No acute distress. Cardiovascular: Heart sounds show a regular rhythm with mild bradycardia. No gallops or rubs. No murmurs. No JVD. Lungs: Clear to auscultation bilaterally with good air movement. No rales, rhonchi or wheezes. Abdomen: Soft, nontender, nondistended with normal active bowel sounds. No masses. No hepatosplenomegaly. Neurological: Alert and  oriented 2. Moves all extremities 4 with equal strength. Cranial nerves II through XII grossly intact. Skin: Warm and dry. No rashes or lesions. Extremities: No clubbing or cyanosis. 2+ pitting pedal/LE edema. Pedal pulses 2+. Psychiatric: Mood and affect are depressed/anxious. Insight and judgment are poor.   Data Reviewed:   I have personally reviewed following labs and imaging studies:  Labs: Labs  show the following:   Basic Metabolic Panel: Recent Labs  Lab 03/05/18 1532 03/06/18 0550  NA 139 139  K 4.5 4.2  CL 103 102  CO2 22 25  GLUCOSE 161* 211*  BUN 64* 61*  CREATININE 3.44* 3.35*  CALCIUM 9.5 9.3   GFR Estimated Creatinine Clearance: 12.4 mL/min (A) (by C-G formula based on SCr of 3.35 mg/dL (H)).  Coagulation profile Recent Labs  Lab 03/05/18 1732  INR 1.10    CBC: Recent Labs  Lab 03/05/18 1532 03/06/18 0550  WBC 3.4* 3.4*  NEUTROABS 2.4  --   HGB 6.6* 8.6*  HCT 22.0* 27.4*  MCV 97.3 93.5  PLT 171 130*   Cardiac Enzymes: Recent Labs  Lab 03/05/18 1532  TROPONINI <0.03   CBG: Recent Labs  Lab 03/05/18 1453 03/05/18 2306  GLUCAP 158* 258*   Thyroid function studies: Recent Labs    03/05/18 1532  TSH 3.825   Anemia work up: Recent Labs    03/05/18 1953  RETICCTPCT 2.3   Microbiology No results found for this or any previous visit (from the past 240 hour(s)).  Procedures and diagnostic studies:  Dg Chest 2 View  Result Date: 03/05/2018 CLINICAL DATA:  Intermittent chest pain for month.  History of CHF. EXAM: CHEST - 2 VIEW COMPARISON:  Chest radiograph January 30, 2017 and CT chest March 04, 2017 FINDINGS: Cardiac silhouette is upper limits of normal in size. Calcified aortic arch. Mild chronic interstitial changes with bibasilar strandy densities in tiny pleural effusions. No focal consolidation. Similar nodular scarring versus confluence of vascular shadows projecting RIGHT upper lobe. No pneumothorax. Soft tissue planes included osseous structures are nonsuspicious. IMPRESSION: Stable borderline cardiomegaly. Stable chronic interstitial changes with tiny pleural effusions. Aortic Atherosclerosis (ICD10-I70.0). Electronically Signed   By: Elon Alas M.D.   On: 03/05/2018 15:12    Medications:   . carvedilol  12.5 mg Oral BID WC  . cloNIDine  0.2 mg Oral TID  . hydrALAZINE  50 mg Oral Daily  . isosorbide mononitrate  120 mg  Oral Daily  . levothyroxine  50 mcg Oral QAC breakfast  . pantoprazole  40 mg Oral Daily  . rosuvastatin  10 mg Oral q1800  . sodium chloride flush  3 mL Intravenous Q12H  . torsemide  20 mg Oral BID   Continuous Infusions:   LOS: 0 days   Jacquelynn Cree  [facility] Pager [phone]. If unable to reach me by pager, please call my cell phone at [phone].  *Please refer to [email or web address], password [email or web address] to get updated schedule on who will round on this patient, as hospitalists switch teams weekly. If 7PM-7AM, please contact night-coverage at [email or web address], password [email or web address] for any overnight needs.  03/06/2018, 8:20 AM

## 2018-03-06 NOTE — Consult Note (Signed)
Reason for Consult: Chronic renal failure Referring Physician: [facility]  [name] is an 84 y.o. female.  HPI: She is a patient who has history of coronary artery disease, diabetes, hypertension, chronic renal failure stage IV presently came with complaints of weakness, feeling tired and she was found to have anemia.  Patient has received blood transfusion and at this moment seems to be feeling better.  Patient denies any nausea or vomiting.  She has some change in color of her stool few days ago otherwise no significant change.  Past Medical History:  Diagnosis Date  . CAD (coronary artery disease) 2003   Last catheterization 2008. Two-vessel PCI of the first OM branch and mid LAD.  Marland Kitchen CKD (chronic kidney disease)   . Congestive heart failure ([place]) 02/2017   grade 2 diastolic dysfunction  . CVA (cerebral vascular accident) ([place]) [place]   . Dementia   . DJD (degenerative joint disease) of lumbar spine   . Hypercholesteremia   . Hypertension   . IDDM (insulin dependent diabetes mellitus) ([place])     Past Surgical History:  Procedure Laterality Date  . APPENDECTOMY    . KNEE SURGERY      Family History  Problem Relation Age of Onset  . Heart attack Mother 5  . Heart attack Father 67  . Diabetes Brother        RETINOPATHY   . Drug abuse Brother   . Liver cancer Brother   . Breast cancer Daughter   . Diabetes Sister     Social History:  reports that she has never smoked. She has never used smokeless tobacco. She reports that she does not drink alcohol or use drugs.  Allergies:  Allergies  Allergen Reactions  . Propoxyphene N-Acetaminophen     Pt does not know reaction  . Simvastatin Other (See Comments)    headache    Medications: I have reviewed the patient's current medications.  Results for orders placed or performed during the hospital encounter of 03/05/18 (from the past 48 hour(s))  CBG monitoring, ED     Status: Abnormal    Collection Time: 03/05/18  2:53 PM  Result Value Ref Range   Glucose-Capillary 158 (H) 65 - 99 mg/dL   Comment 1 Notify RN   Basic metabolic panel     Status: Abnormal   Collection Time: 03/05/18  3:32 PM  Result Value Ref Range   Sodium 139 135 - 145 mmol/L   Potassium 4.5 3.5 - 5.1 mmol/L   Chloride 103 101 - 111 mmol/L   CO2 22 22 - 32 mmol/L   Glucose, Bld 161 (H) 65 - 99 mg/dL   BUN 64 (H) 6 - 20 mg/dL   Creatinine, Ser 3.44 (H) 0.44 - 1.00 mg/dL   Calcium 9.5 8.9 - 10.3 mg/dL   GFR calc non Af Amer 11 (L) >60 mL/min   GFR calc Af Amer 13 (L) >60 mL/min    Comment: (NOTE) The eGFR has been calculated using the CKD EPI equation. This calculation has not been validated in all clinical situations. eGFR's persistently <60 mL/min signify possible Chronic Kidney Disease.    Anion gap 14 5 - 15    Comment: Performed at [facility], [address] [place], [place] [address]  CBC with Differential     Status: Abnormal   Collection Time: 03/05/18  3:32 PM  Result Value Ref Range   WBC 3.4 (L) 4.0 - 10.5 K/uL  RBC 2.26 (L) 3.87 - 5.11 MIL/uL   Hemoglobin 6.6 (LL) 12.0 - 15.0 g/dL    Comment: CRITICAL RESULT CALLED TO, READ BACK BY AND VERIFIED WITH: EASTWOOD J. AT 1645 ON [id number] BY THOMPSON S.    HCT 22.0 (L) 36.0 - 46.0 %   MCV 97.3 78.0 - 100.0 fL   MCH 29.2 26.0 - 34.0 pg   MCHC 30.0 30.0 - 36.0 g/dL   RDW 13.2 11.5 - 15.5 %   Platelets 171 150 - 400 K/uL   Neutrophils Relative % 68 %   Lymphocytes Relative 18 %   Monocytes Relative 10 %   Eosinophils Relative 4 %   Basophils Relative 0 %   Neutro Abs 2.4 1.7 - 7.7 K/uL   Lymphs Abs 0.6 (L) 0.7 - 4.0 K/uL   Monocytes Absolute 0.3 0.1 - 1.0 K/uL   Eosinophils Absolute 0.1 0.0 - 0.7 K/uL   Basophils Absolute 0.0 0.0 - 0.1 K/uL   WBC Morphology WHITE COUNT CONFIRMED ON SMEAR     Comment: Performed at [facility], [address], [place], [place] [address]  Troponin I     Status: None   Collection Time: 03/05/18  3:32  PM  Result Value Ref Range   Troponin I <0.03 <0.03 ng/mL    Comment: Performed at [facility], [address] [place], [place] [address]  TSH     Status: None   Collection Time: 03/05/18  3:32 PM  Result Value Ref Range   TSH 3.825 0.350 - 4.500 uIU/mL    Comment: Performed by a 3rd Generation assay with a functional sensitivity of <=0.01 uIU/mL. Performed at [facility], [address], [place], [place] [address]   Type and screen [facility]     Status: None (Preliminary result)   Collection Time: 03/05/18  5:31 PM  Result Value Ref Range   ABO/RH(D) A POS    Antibody Screen NEG    Sample Expiration 03/08/2018    Unit Number [id number]    Blood Component Type RED CELLS,LR    Unit division 00    Status of Unit ISSUED    Transfusion Status OK TO TRANSFUSE    Crossmatch Result COMPATIBLE    Unit Number [id number]    Blood Component Type RED CELLS,LR    Unit division 00    Status of Unit ISSUED,FINAL    Transfusion Status OK TO TRANSFUSE    Crossmatch Result COMPATIBLE   Protime-INR     Status: None   Collection Time: 03/05/18  5:32 PM  Result Value Ref Range   Prothrombin Time 14.1 11.4 - 15.2 seconds   INR 1.10     Comment: Performed at [facility], [address], [place], [place] [address]  POC occult blood, ED Provider will collect     Status: None   Collection Time: 03/05/18  6:12 PM  Result Value Ref Range   Fecal Occult Bld NEGATIVE NEGATIVE  Reticulocytes     Status: Abnormal   Collection Time: 03/05/18  7:53 PM  Result Value Ref Range   Retic Ct Pct 2.3 0.4 - 3.1 %   RBC. 2.33 (L) 3.87 - 5.11 MIL/uL   Retic Count, Absolute 53.6 19.0 - 186.0 K/uL    Comment: Performed at [facility], [address] [place], [place] [address]  Prepare RBC     Status: None   Collection Time: 03/05/18  8:13 PM  Result Value Ref Range   Order Confirmation  ORDER PROCESSED BY BLOOD BANK Performed at [facility], [address] [place], [place] [address]    Glucose, capillary     Status: Abnormal   Collection Time: 03/05/18 11:06 PM  Result Value Ref Range   Glucose-Capillary 258 (H) 65 - 99 mg/dL  Basic metabolic panel     Status: Abnormal   Collection Time: 03/06/18  5:50 AM  Result Value Ref Range   Sodium 139 135 - 145 mmol/L   Potassium 4.2 3.5 - 5.1 mmol/L   Chloride 102 101 - 111 mmol/L   CO2 25 22 - 32 mmol/L   Glucose, Bld 211 (H) 65 - 99 mg/dL   BUN 61 (H) 6 - 20 mg/dL   Creatinine, Ser 3.35 (H) 0.44 - 1.00 mg/dL   Calcium 9.3 8.9 - 10.3 mg/dL   GFR calc non Af Amer 12 (L) >60 mL/min   GFR calc Af Amer 14 (L) >60 mL/min    Comment: (NOTE) The eGFR has been calculated using the CKD EPI equation. This calculation has not been validated in all clinical situations. eGFR's persistently <60 mL/min signify possible Chronic Kidney Disease.    Anion gap 12 5 - 15    Comment: Performed at [facility], [address] [place], [place] [address]  CBC     Status: Abnormal   Collection Time: 03/06/18  5:50 AM  Result Value Ref Range   WBC 3.4 (L) 4.0 - 10.5 K/uL   RBC 2.93 (L) 3.87 - 5.11 MIL/uL   Hemoglobin 8.6 (L) 12.0 - 15.0 g/dL    Comment: DELTA CHECK NOTED POST TRANSFUSION SPECIMEN    HCT 27.4 (L) 36.0 - 46.0 %   MCV 93.5 78.0 - 100.0 fL   MCH 29.4 26.0 - 34.0 pg   MCHC 31.4 30.0 - 36.0 g/dL   RDW 14.3 11.5 - 15.5 %   Platelets 130 (L) 150 - 400 K/uL    Comment: Performed at [facility], [address] [place], [place] [address]    Dg Chest 2 View  Result Date: 03/05/2018 CLINICAL DATA:  Intermittent chest pain for month.  History of CHF. EXAM: CHEST - 2 VIEW COMPARISON:  Chest radiograph January 30, 2017 and CT chest March 04, 2017 FINDINGS: Cardiac silhouette is upper limits of normal in size. Calcified aortic arch. Mild chronic interstitial changes with bibasilar strandy densities in tiny pleural effusions. No focal consolidation. Similar nodular scarring versus confluence of vascular shadows projecting RIGHT upper lobe.  No pneumothorax. Soft tissue planes included osseous structures are nonsuspicious. IMPRESSION: Stable borderline cardiomegaly. Stable chronic interstitial changes with tiny pleural effusions. Aortic Atherosclerosis (ICD10-I70.0). Electronically Signed   By: Elon Alas M.D.   On: 03/05/2018 15:12    Review of Systems  Constitutional: Positive for malaise/fatigue.  Cardiovascular: Positive for chest pain and leg swelling.  Gastrointestinal: Negative for abdominal pain, blood in stool, nausea and vomiting.   Blood pressure (!) 172/56, pulse (!) 54, temperature 98.2 F (36.8 C), temperature source Oral, resp. rate 16, height _0  (1.702 m), weight 68.6 kg (151 lb 3.8 oz), SpO2 100 %. Physical Exam  Cardiovascular:  Murmur heard. Respiratory: She has no wheezes.    Assessment/Plan: Problem #1 renal failure: Chronic.  Stage IV.  Etiology was thought to be secondary to diabetes/hypertension/recurrent acute kidney injury.  Presently she is a symptomatic.  Potassium is normal. Problem #2 anemia: This is also recurrent problem.  Possibly a combination of anemia of chronic disease and iron deficiency anemia.  Patient  declined any workup from before.  Presently she has received blood transfusion and feeling better. 3] bone and mineral disorder: Her calcium and phosphorus is range 4] hypertension: Her blood pressure is reasonably controlled 5] diabetes 6] fluid management: No significant sign of fluid overload 7] history of CVA Plan: We will follow patient.  Presently she does not need dialysis but the patient still now seems to be declining having fistula placement. 2] we will continue to follow her as an outpatient and possibly make a decision about access placement if she decides to go visit.  Delonte Musich S 03/06/2018, 8:06 AM

## 2018-03-07 DIAGNOSIS — D649 Anemia, unspecified: Secondary | ICD-10-CM

## 2018-03-07 DIAGNOSIS — E1129 Type 2 diabetes mellitus with other diabetic kidney complication: Secondary | ICD-10-CM | POA: Diagnosis not present

## 2018-03-07 DIAGNOSIS — I10 Essential (primary) hypertension: Secondary | ICD-10-CM | POA: Diagnosis not present

## 2018-03-07 DIAGNOSIS — N184 Chronic kidney disease, stage 4 (severe): Secondary | ICD-10-CM | POA: Diagnosis not present

## 2018-03-07 LAB — RENAL FUNCTION PANEL
ANION GAP: 11 (ref 5–15)
Albumin: 3 g/dL — ABNORMAL LOW (ref 3.5–5.0)
BUN: 63 mg/dL — ABNORMAL HIGH (ref 6–20)
CALCIUM: 9.4 mg/dL (ref 8.9–10.3)
CO2: 23 mmol/L (ref 22–32)
Chloride: 106 mmol/L (ref 101–111)
Creatinine, Ser: 3.53 mg/dL — ABNORMAL HIGH (ref 0.44–1.00)
GFR, EST AFRICAN AMERICAN: 13 mL/min — AB (ref >60)
GFR, EST NON AFRICAN AMERICAN: 11 mL/min — AB (ref >60)
Glucose, Bld: 147 mg/dL — ABNORMAL HIGH (ref 65–99)
PHOSPHORUS: 5.2 mg/dL — AB (ref 2.5–4.6)
POTASSIUM: 4.3 mmol/L (ref 3.5–5.1)
Sodium: 140 mmol/L (ref 135–145)

## 2018-03-07 LAB — TYPE AND SCREEN
ABO/RH(D): A POS
ANTIBODY SCREEN: NEGATIVE
Unit division: 0
Unit division: 0

## 2018-03-07 LAB — BPAM RBC
BLOOD PRODUCT EXPIRATION DATE: 201905072359
Blood Product Expiration Date: 201905082359
ISSUE DATE / TIME: 201904090241
ISSUE DATE / TIME: 201904082338
UNIT TYPE AND RH: 6200
Unit Type and Rh: 6200

## 2018-03-07 LAB — GLUCOSE, CAPILLARY
GLUCOSE-CAPILLARY: 146 mg/dL — AB (ref 65–99)
Glucose-Capillary: 201 mg/dL — ABNORMAL HIGH (ref 65–99)

## 2018-03-07 MED ORDER — HYDRALAZINE HCL 50 MG PO TABS: 50.0000 mg | ORAL_TABLET | Freq: Three times a day (TID) | ORAL | 0 refills | Status: DC

## 2018-03-07 MED ORDER — HYDRALAZINE HCL 25 MG PO TABS
50.0000 mg | ORAL_TABLET | Freq: Three times a day (TID) | ORAL | Status: DC
  Administered 2018-03-07 (×2): 50 mg via ORAL
  Filled 2018-03-07 (×2): qty 2

## 2018-03-07 NOTE — Consult Note (Signed)
   [facility] CM Inpatient Consult   03/07/2018  [name] [birth year] [id number]   Referral received from inpatient case manger for [place] [facility] services and post hospital discharge follow up related to a diagnosis of CKD, DM and 5 admits in the last 6 months . Patient was evaluated for community based chronic disease management services with [facility] care Management Program as a benefit of patient's [employer]. Spoke with inpatient case manager and was advised to speak with her daughter who is also her caregiver. Spoke with patient's daughter [name] this am to explain [place] [facility] services. Daughter was familiar with [facility] and stated Jacqlyn Larsen RN had assisted her mother in the past and was extremely helpful. Daughter explained her mom was sometimes non adherent with her medications although she sets it out for her in the am and pm for her. Daughter also stated patient does not want to take her insulin correctly at times which has caused a low blood sugar in the past. Daughter stated because of these adherence issues the Md wanted patient on a clonidine patch to control her b/p but it has not been affordable for her mother. Daughter states her mother lives with her at [address] [place], [place].  Verbal consent recieved.  Daughter [name] gave [phone] as the best number to reach her mother. She also gave permission to call her her cell number [phone] if her mom  can not be reached. Patient will receive post hospital discharge calls and be evaluated for monthly home visits. Patient will also receive a call from [facility] Pharmacist to address medication issues.  [facility] Care Management services does not interfere with or replace any services arranged by the inpatient care management team. RNCM left contact information and [facility] literature at the bedside. Made inpatient RNCM aware  [facility] will be following for care management. For additional  questions please contact:   Deannie Resetar RN, [facility] Liaison  [phone]) Business Mobile [phone]) Toll free office

## 2018-03-07 NOTE — Progress Notes (Signed)
Blood pressure was found to be high on routine vitals this morning, 197/57, rechecked by nurse at 186/56.  Patient given PRN Hydralazine 10mg .

## 2018-03-07 NOTE — Discharge Summary (Signed)
Physician Discharge Summary  [name] HWE:[id number] DOB: 08-28-1932 DOA: 03/05/2018  PCP: Timmothy Euler, MD  Admit date: 03/05/2018  Discharge date: 03/07/2018  Admitted From:Home  Disposition:  Home  Recommendations for Outpatient Follow-up:  1. Follow up with PCP in 1-2 weeks to recheck BP  Home Health:Yes  Equipment/Devices:None  Discharge Condition:Stable  CODE STATUS: Full  Diet recommendation: Heart Healthy  Brief/Interim Summary:  [name] is an 84 y.o. female with a PMH of stage IV CKD, GFR less than 20 at baseline, insulin-dependent diabetes, hypertension, HLD, dementia, history of stroke, CAD, and chronic diastolic CHF (last echo done 12/22/17 which showed an EF of 60-65% and grade 2 diastolic dysfunction) who was admitted 03/05/18 for evaluation of a 2-day history of weakness.  Hemoglobin was found to be 6.6 mg/dL upon workup in the ED. She has undergone 2 unit PRBC transfusion with appropriate increase in hemoglobin levels.  She has been seen by nephrology with no additional recommendations otherwise noted at this time.  Home hydralazine has been increased to 50 mg 3 times daily and Demadex has been reordered as per her usual home dose.  This will hopefully assist with her blood pressure management which has remained in the systolic 893-810 range.  She is otherwise asymptomatic at this time and ready for discharge with home health that has been set up for her.   Discharge Diagnoses:  Principal Problem:   Acute anemia Active Problems:   HTN (hypertension)   Anemia in chronic kidney disease   Insulin dependent diabetes mellitus with complications (HCC)   Chronic kidney disease, stage IV (severe) (HCC)  1. Generalized weakness secondary to acute on chronic anemia of CKD.  Status post 2 unit PRBCs with stability noted.  She has no overt bleeding identified.  Resume home aspirin and Plavix. 2. Hypertension.  Continue home medications with hydralazine  increased to 50 mg 3 times daily.  Notably, she was not receiving Demadex while here in the inpatient setting and this will be resumed.  She will require follow-up to her PCP in the next 1 week to ensure that blood pressure levels have improved. 3. Hypothyroidism.  Continue Synthroid. 4. Dyslipidemia.  Continue Crestor. 5. Insulin-dependent diabetes.  Continue home NPH insulin. 6. CKD stage IV.  Nephrology has seen patient with no further recommendations noted at this time.  Patient appears to be resistant to dialysis initiation.  Should be set up to follow-up in the outpatient setting.  Discharge Instructions  Discharge Instructions    AMB Referral to [facility] [facility]   Complete by:  As directed    Please assign patient for community nurse to engage for transition of care calls and evaluate for monthly home visits. Please assign [facility] Pharmacist for assistance with medication adherence, medication assistance and medication management. For questions please contact:   Janci Minor RN, [facility] Liaison [phone])   Reason for consult:  Post hospital discharge follow-up with [place] RN and [facility] Pharmacist   Diagnoses of:   Diabetes Kidney Failure     Expected date of contact:  1-3 days (reserved for hospital discharges)   Diet - low sodium heart healthy   Complete by:  As directed    Increase activity slowly   Complete by:  As directed      Allergies as of 03/07/2018      Reactions   Propoxyphene N-acetaminophen    Pt does not know reaction   Simvastatin Other (See Comments)  headache      Medication List    TAKE these medications   aspirin 81 MG EC tablet Take 1 tablet (81 mg total) by mouth daily.   carvedilol 12.5 MG tablet Commonly known as:  COREG Take 1 tablet (12.5 mg total) by mouth 2 (two) times daily with a meal.   cholecalciferol 1000 units tablet Commonly known as:  VITAMIN D Take 1,000 Units by mouth daily.   cloNIDine 0.2 MG  tablet Commonly known as:  CATAPRES Take 0.2-0.4 mg by mouth See admin instructions. Give two tablets in the morning and one tablet in the evening   clopidogrel 75 MG tablet Commonly known as:  PLAVIX Take 1 tablet (75 mg total) by mouth daily.   diclofenac sodium 1 % Gel Commonly known as:  VOLTAREN Apply 4 g topically 4 (four) times daily. What changed:    when to take this  reasons to take this   feeding supplement (ENSURE ENLIVE) Liqd Take 237 mLs by mouth 2 (two) times daily between meals.   glucose blood test strip Commonly known as:  ACCU-CHEK AVIVA PLUS CHECK BLOOD SUGER UP TO 3 TIMES A DAY   hydrALAZINE 50 MG tablet Commonly known as:  APRESOLINE Take 1 tablet (50 mg total) by mouth every 8 (eight) hours. What changed:  when to take this   HYDROcodone-acetaminophen 5-325 MG tablet Commonly known as:  NORCO Take 0.5-1 tablets every 6 (six) hours as needed by mouth for moderate pain.   insulin NPH Human 100 UNIT/ML injection Commonly known as:  NOVOLIN N Inject 0.1 mLs (10 Units total) into the skin 2 (two) times daily before a meal.   isosorbide mononitrate 120 MG 24 hr tablet Commonly known as:  IMDUR Take 1 tablet (120 mg total) by mouth daily.   levothyroxine 50 MCG tablet Commonly known as:  SYNTHROID, LEVOTHROID TAKE 1 TABLET DAILY   nitroGLYCERIN 0.4 MG SL tablet Commonly known as:  NITROSTAT PLACE 1 TABLET UNDER THE TONGUE AT ONSET OF CHEST PAIN EVERY 5 MINTUES UP TO 3 TIMES AS NEEDED   pantoprazole 40 MG tablet Commonly known as:  PROTONIX TAKE 1 TABLET DAILY   polyethylene glycol powder powder Commonly known as:  GLYCOLAX/MIRALAX MIX 1 CAPFUL (17 GRAMS) WITH 8OZ OF WATER OR JUICE DAILY. What changed:  See the new instructions.   rosuvastatin 10 MG tablet Commonly known as:  CRESTOR Take 1 tablet (10 mg total) by mouth daily.   torsemide 20 MG tablet Commonly known as:  DEMADEX Take 1 tablet (20 mg total) by mouth 2 (two) times daily.       Follow-up Information    Timmothy Euler, MD Follow up in 1 week(s).   Specialty:  Family Medicine Why:  f/u bp Contact information: [place] [address] [phone]          Allergies  Allergen Reactions  . Propoxyphene N-Acetaminophen     Pt does not know reaction  . Simvastatin Other (See Comments)    headache    Consultations:  Nephrology   Procedures/Studies: Dg Chest 2 View  Result Date: 03/05/2018 CLINICAL DATA:  Intermittent chest pain for month.  History of CHF. EXAM: CHEST - 2 VIEW COMPARISON:  Chest radiograph January 30, 2017 and CT chest March 04, 2017 FINDINGS: Cardiac silhouette is upper limits of normal in size. Calcified aortic arch. Mild chronic interstitial changes with bibasilar strandy densities in tiny pleural effusions. No focal consolidation. Similar nodular scarring versus confluence  of vascular shadows projecting RIGHT upper lobe. No pneumothorax. Soft tissue planes included osseous structures are nonsuspicious. IMPRESSION: Stable borderline cardiomegaly. Stable chronic interstitial changes with tiny pleural effusions. Aortic Atherosclerosis (ICD10-I70.0). Electronically Signed   By: Elon Alas M.D.   On: 03/05/2018 15:12      Subjective:   Discharge Exam: Vitals:   03/07/18 1200 03/07/18 1216  BP: (!) 177/55 (!) 164/62  Pulse: (!) 55   Resp: 12   Temp:    SpO2: 98%    Vitals:   03/07/18 0558 03/07/18 0710 03/07/18 1200 03/07/18 1216  BP: (!) 186/56 (!) 174/55 (!) 177/55 (!) 164/62  Pulse: (!) 56 (!) 58 (!) 55   Resp: 18 18 12    Temp:      TempSrc:      SpO2: 98% 98% 98%   Weight:      Height:        General: Pt is alert, awake, not in acute distress Cardiovascular: RRR, S1/S2 +, no rubs, no gallops Respiratory: CTA bilaterally, no wheezing, no rhonchi Abdominal: Soft, NT, ND, bowel sounds + Extremities: no edema, no cyanosis    The results of significant diagnostics from this hospitalization  (including imaging, microbiology, ancillary and laboratory) are listed below for reference.     Microbiology: No results found for this or any previous visit (from the past 240 hour(s)).   Labs: BNP (last 3 results) Recent Labs    09/20/17 1940  BNP 2,956.2*   Basic Metabolic Panel: Recent Labs  Lab 03/05/18 1532 03/06/18 0550 03/07/18 0622  NA 139 139 140  K 4.5 4.2 4.3  CL 103 102 106  CO2 22 25 23   GLUCOSE 161* 211* 147*  BUN 64* 61* 63*  CREATININE 3.44* 3.35* 3.53*  CALCIUM 9.5 9.3 9.4  PHOS  --   --  5.2*   Liver Function Tests: Recent Labs  Lab 03/07/18 0622  ALBUMIN 3.0*   No results for input(s): LIPASE, AMYLASE in the last 168 hours. No results for input(s): AMMONIA in the last 168 hours. CBC: Recent Labs  Lab 03/05/18 1532 03/06/18 0550  WBC 3.4* 3.4*  NEUTROABS 2.4  --   HGB 6.6* 8.6*  HCT 22.0* 27.4*  MCV 97.3 93.5  PLT 171 130*   Cardiac Enzymes: Recent Labs  Lab 03/05/18 1532  TROPONINI <0.03   BNP: Invalid input(s): POCBNP CBG: Recent Labs  Lab 03/06/18 1149 03/06/18 1733 03/06/18 2054 03/07/18 0754 03/07/18 1138  GLUCAP 186* 113* 238* 146* 201*   D-Dimer No results for input(s): DDIMER in the last 72 hours. Hgb A1c No results for input(s): HGBA1C in the last 72 hours. Lipid Profile No results for input(s): CHOL, HDL, LDLCALC, TRIG, CHOLHDL, LDLDIRECT in the last 72 hours. Thyroid function studies Recent Labs    03/05/18 1532  TSH 3.825   Anemia work up Recent Labs    03/05/18 1953 03/06/18 0550  VITAMINB12  --  268  FOLATE  --  12.1  FERRITIN  --  359*  TIBC  --  182*  IRON  --  63  RETICCTPCT 2.3  --    Urinalysis    Component Value Date/Time   COLORURINE YELLOW 10/15/2017 0910   APPEARANCEUR Clear 11/16/2017 1213   LABSPEC 1.012 10/15/2017 0910   PHURINE 5.0 10/15/2017 0910   GLUCOSEU Trace (A) 11/16/2017 1213   HGBUR NEGATIVE 10/15/2017 0910   BILIRUBINUR Negative 11/16/2017 1213   [place] 10/15/2017 0910   PROTEINUR 3+ (A) 11/16/2017 1213  PROTEINUR 100 (A) 10/15/2017 0910   UROBILINOGEN negative 07/02/2015 1505   UROBILINOGEN 0.2 05/10/2015 1245   NITRITE Negative 11/16/2017 1213   NITRITE NEGATIVE 10/15/2017 0910   LEUKOCYTESUR Trace (A) 11/16/2017 1213   Sepsis Labs Invalid input(s): PROCALCITONIN,  WBC,  LACTICIDVEN Microbiology No results found for this or any previous visit (from the past 240 hour(s)).   Time coordinating discharge: 35 minutes  SIGNED:   Rodena Goldmann, DO [facility] 03/07/2018, 12:18 PM Pager [phone]  If 7PM-7AM, please contact night-coverage [email or web address] Password [email or web address]

## 2018-03-07 NOTE — Progress Notes (Signed)
[place] discharged Home per MD order.  Discharge instructions reviewed and discussed with the patient and daughter, all questions and concerns answered. Copy of instructions and scripts given to patient.  Allergies as of 03/07/2018      Reactions   Propoxyphene N-acetaminophen    Pt does not know reaction   Simvastatin Other (See Comments)   headache      Medication List    TAKE these medications   aspirin 81 MG EC tablet Take 1 tablet (81 mg total) by mouth daily.   carvedilol 12.5 MG tablet Commonly known as:  COREG Take 1 tablet (12.5 mg total) by mouth 2 (two) times daily with a meal.   cholecalciferol 1000 units tablet Commonly known as:  VITAMIN D Take 1,000 Units by mouth daily.   cloNIDine 0.2 MG tablet Commonly known as:  CATAPRES Take 0.2-0.4 mg by mouth See admin instructions. Give two tablets in the morning and one tablet in the evening   clopidogrel 75 MG tablet Commonly known as:  PLAVIX Take 1 tablet (75 mg total) by mouth daily.   diclofenac sodium 1 % Gel Commonly known as:  VOLTAREN Apply 4 g topically 4 (four) times daily. What changed:    when to take this  reasons to take this   feeding supplement (ENSURE ENLIVE) Liqd Take 237 mLs by mouth 2 (two) times daily between meals.   glucose blood test strip Commonly known as:  ACCU-CHEK AVIVA PLUS CHECK BLOOD SUGER UP TO 3 TIMES A DAY   hydrALAZINE 50 MG tablet Commonly known as:  APRESOLINE Take 1 tablet (50 mg total) by mouth every 8 (eight) hours. What changed:  when to take this   HYDROcodone-acetaminophen 5-325 MG tablet Commonly known as:  NORCO Take 0.5-1 tablets every 6 (six) hours as needed by mouth for moderate pain.   insulin NPH Human 100 UNIT/ML injection Commonly known as:  NOVOLIN N Inject 0.1 mLs (10 Units total) into the skin 2 (two) times daily before a meal.   isosorbide mononitrate 120 MG 24 hr tablet Commonly known as:  IMDUR Take 1 tablet (120 mg total) by mouth  daily.   levothyroxine 50 MCG tablet Commonly known as:  SYNTHROID, LEVOTHROID TAKE 1 TABLET DAILY   nitroGLYCERIN 0.4 MG SL tablet Commonly known as:  NITROSTAT PLACE 1 TABLET UNDER THE TONGUE AT ONSET OF CHEST PAIN EVERY 5 MINTUES UP TO 3 TIMES AS NEEDED   pantoprazole 40 MG tablet Commonly known as:  PROTONIX TAKE 1 TABLET DAILY   polyethylene glycol powder powder Commonly known as:  GLYCOLAX/MIRALAX MIX 1 CAPFUL (17 GRAMS) WITH 8OZ OF WATER OR JUICE DAILY. What changed:  See the new instructions.   rosuvastatin 10 MG tablet Commonly known as:  CRESTOR Take 1 tablet (10 mg total) by mouth daily.   torsemide 20 MG tablet Commonly known as:  DEMADEX Take 1 tablet (20 mg total) by mouth 2 (two) times daily.       IV site discontinued and catheter remains intact. Site without signs and symptoms of complications. Dressing and pressure applied.  Patient escorted to car by NT in a wheelchair,  no distress noted upon discharge.  Ralene Muskrat Leatrice Parilla 03/07/2018 3:38 PM

## 2018-03-07 NOTE — Progress Notes (Signed)
[name]  MRN: [id number]  DOB/AGE: 1932/07/21 84 y.o.  Primary Care Physician:Bradshaw, Sherley Bounds, MD  Admit date: 03/05/2018  Chief Complaint:  Chief Complaint  Patient presents with  . Leg Pain  . Chest Pain    S-Pt presented on  03/05/2018 with  Chief Complaint  Patient presents with  . Leg Pain  . Chest Pain  .    Pt offers no new complaints.    Meds . carvedilol  12.5 mg Oral BID WC  . cloNIDine  0.2 mg Oral TID  . hydrALAZINE  50 mg Oral Daily  . insulin aspart  0-5 Units Subcutaneous QHS  . insulin aspart  0-9 Units Subcutaneous TID WC  . insulin glargine  10 Units Subcutaneous QHS  . isosorbide mononitrate  120 mg Oral Daily  . levothyroxine  50 mcg Oral QAC breakfast  . pantoprazole  40 mg Oral Daily  . rosuvastatin  10 mg Oral q1800  . sodium chloride flush  3 mL Intravenous Q12H  . torsemide  20 mg Oral BID      Physical Exam: Vital signs in last 24 hours: Temp:  [98.1 F (36.7 C)-98.2 F (36.8 C)] 98.2 F (36.8 C) (04/09 2049) Pulse Rate:  [54-55] 55 (04/09 2049) Resp:  [16-20] 20 (04/09 2049) BP: (134-176)/(45-56) 172/45 (04/09 2049) SpO2:  [93 %-100 %] 93 % (04/09 2102) Weight change:  Last BM Date: 03/03/18  Intake/Output from previous day: 04/09 0701 - 04/10 0700 In: 360 [P.O.:360] Out: 600 [Urine:600] No intake/output data recorded.   Physical Exam: General- pt is awake,alert, oriented to time place and person Resp- No acute REsp distress, CTA B/L NO Rhonchi CVS- S1S2 regular ij rate and rhythm GIT- BS+, soft, NT, ND EXT- NO LE Edema, Cyanosis   Lab Results: CBC Recent Labs    03/05/18 1532 03/06/18 0550  WBC 3.4* 3.4*  HGB 6.6* 8.6*  HCT 22.0* 27.4*  PLT 171 130*    BMET Recent Labs    03/05/18 1532 03/06/18 0550  NA 139 139  K 4.5 4.2  CL 103 102  CO2 22 25  GLUCOSE 161* 211*  BUN 64* 61*  CREATININE 3.44* 3.35*  CALCIUM 9.5 9.3   Creat trend 2019  3.3--3.8 2018  2.5--4.3 2017  2.0--3.7 2016   2.1--3.0 2015  2.4--3.1 2014 2.3--3.1 2013  1.9--2.7 2012  1.7--2.0 2011   1.7--2.5 2008  1.4--1.7   hgb 2019 6.6=> 8.6  Lab Results  Component Value Date   PTH 138 (H) 08/13/2015   CALCIUM 9.3 03/06/2018   CAION 1.20 12/22/2017   PHOS 3.9 08/13/2015        Impression: 1)Renal CKD stage 4 .               CKD since 2008               CKD secondary to DM/HTN/Age asso decline                Progression of CKD  Marked with multiple AKI                Proteinuria Present..                Hematuria absent .                Nephrolithiasis Hx Absent   2)HTN  Medication- On Diuretics On Alpha and beta Blockers . On Vasodilators. On [employer].   3)Anemia HGb now near to  goal (9--11) recived PRBC   4)CKD Mineral-Bone Disorder PTH elevated. Secondary Hyperparathyroidismpresent . Phosphorus at goal. Calcium is at goal.  5)Endocrine-Hx of DM                       Hx of Hypothyroidism  Primary MD following  6)Electrolytes  Normokalemic NOrmonatremic   7)Acid base Co2 at goal     Plan:   Will continue current care.     Ommie Degeorge S 03/07/2018, 5:17 AM

## 2018-03-08 ENCOUNTER — Other Ambulatory Visit: Payer: Self-pay

## 2018-03-08 ENCOUNTER — Ambulatory Visit: Payer: Self-pay | Admitting: Family Medicine

## 2018-03-08 ENCOUNTER — Other Ambulatory Visit: Payer: Self-pay | Admitting: Family Medicine

## 2018-03-08 DIAGNOSIS — M6281 Muscle weakness (generalized): Secondary | ICD-10-CM | POA: Diagnosis not present

## 2018-03-08 DIAGNOSIS — Z794 Long term (current) use of insulin: Secondary | ICD-10-CM | POA: Diagnosis not present

## 2018-03-08 DIAGNOSIS — I2510 Atherosclerotic heart disease of native coronary artery without angina pectoris: Secondary | ICD-10-CM | POA: Diagnosis not present

## 2018-03-08 DIAGNOSIS — Z7982 Long term (current) use of aspirin: Secondary | ICD-10-CM | POA: Diagnosis not present

## 2018-03-08 DIAGNOSIS — D631 Anemia in chronic kidney disease: Secondary | ICD-10-CM | POA: Diagnosis not present

## 2018-03-08 DIAGNOSIS — Z8673 Personal history of transient ischemic attack (TIA), and cerebral infarction without residual deficits: Secondary | ICD-10-CM | POA: Diagnosis not present

## 2018-03-08 DIAGNOSIS — S80922D Unspecified superficial injury of left lower leg, subsequent encounter: Secondary | ICD-10-CM | POA: Diagnosis not present

## 2018-03-08 DIAGNOSIS — I130 Hypertensive heart and chronic kidney disease with heart failure and stage 1 through stage 4 chronic kidney disease, or unspecified chronic kidney disease: Secondary | ICD-10-CM | POA: Diagnosis not present

## 2018-03-08 DIAGNOSIS — E1122 Type 2 diabetes mellitus with diabetic chronic kidney disease: Secondary | ICD-10-CM | POA: Diagnosis not present

## 2018-03-08 DIAGNOSIS — I5032 Chronic diastolic (congestive) heart failure: Secondary | ICD-10-CM | POA: Diagnosis not present

## 2018-03-08 DIAGNOSIS — Z7902 Long term (current) use of antithrombotics/antiplatelets: Secondary | ICD-10-CM | POA: Diagnosis not present

## 2018-03-08 DIAGNOSIS — N184 Chronic kidney disease, stage 4 (severe): Secondary | ICD-10-CM | POA: Diagnosis not present

## 2018-03-08 NOTE — Patient Outreach (Signed)
[facility]) Care Management  [place]   03/08/2018  [name] 11/26/1932 [id number] 84 year old female referred to [place] Management by [facility] hospital liason.  [place] services requested for medication assistance, medication management and medication adherence.Marland Kitchen  PMHx includes, but not limited to, hypertension, paroxysmal atrial fibrillation, CAD, cerebrovascular disease, diabetes mellitus, hyperlipidemia, anemia,   Successful outreach call placed to Ms. Antillon.  HIPPA identifiers verified.   Subjective: Spoke with [name] briefly and the remainder of the call was with the patient's daughter, [name].  Daughter states that she is a Automotive engineer.  States that she helps give her mom her medications daily.  She reports that her mother will often refuse her morning insulin if her morning CBG is < 100 and then later in the afternoon her glucose will "shoot up to over 300" and then she gives her mom extra insulin (15 units instead of 10 units) to get it back under control.  She states her mom checks her glucose three times daily.   She states that her mom's NPH insulin cost $160 because she hasn't met her deductible.  Reports she was told that she could buy something cheaper at [facility], but it wasn't "real insulin."  The daughter states her mom had tried Lantus once, but it was too expensive, so she continued with NPH.   Daughter reports that her mom's glucose was 107 this morning and she gave her 10 units of NPH.  While daughter was gone, the patient checked her glucose and it was 53 (patient was asymptomatic) and she panicked because she was alone.  States her mom started yelling and a painter at the house  gave her a cookie.  Daughter reports that her mom does not have any glucose tablets to use in emergencies.    She states that she also checks her blood pressure daily and that it was "177 over something" last time it was checked.  She said that  the doctor had wanted to put her mom on a clonidine patch but it cost $90, so they kept her on clonidine tablets.  States that she tried to get her mom a doctor's appointment in April when her feet swelled, but she took her to the ED because there were no available appointments.    Objective:  HgA1c 6.6% in 1/19 SCr 3.53 4/19 Hg 8.6 g/dL post transfusion on 03/06/18  Current Medications: Current Outpatient Medications  Medication Sig Dispense Refill  . aspirin EC 81 MG EC tablet Take 1 tablet (81 mg total) by mouth daily. 30 tablet 11  . carvedilol (COREG) 12.5 MG tablet Take 1 tablet (12.5 mg total) by mouth 2 (two) times daily with a meal. 60 tablet 3  . cholecalciferol (VITAMIN D) 1000 units tablet Take 1,000 Units by mouth daily.    . cloNIDine (CATAPRES) 0.2 MG tablet Take 0.2-0.4 mg by mouth See admin instructions. Give two tablets in the morning and one tablet in the evening    . diclofenac sodium (VOLTAREN) 1 % GEL Apply 4 g topically 4 (four) times daily. (Patient taking differently: Apply 4 g topically 4 (four) times daily as needed (FOR PAIN). ) 100 g 2  . glucose blood (ACCU-CHEK AVIVA PLUS) test strip CHECK BLOOD SUGER UP TO 3 TIMES A DAY 300 each 3  . hydrALAZINE (APRESOLINE) 50 MG tablet Take 1 tablet (50 mg total) by mouth every 8 (eight) hours. 90 tablet 0  . HYDROcodone-acetaminophen (NORCO) 5-325  MG tablet Take 0.5-1 tablets every 6 (six) hours as needed by mouth for moderate pain. (Patient taking differently: Take 0.5-1 tablets by mouth every 6 (six) hours as needed for moderate pain. Daughter gives her 1/2 tablet as needed. Usually takes no more than 2 doses/day.) 30 tablet 0  . insulin NPH Human (NOVOLIN N) 100 UNIT/ML injection Inject 0.1 mLs (10 Units total) into the skin 2 (two) times daily before a meal. 10 mL 5  . isosorbide mononitrate (IMDUR) 120 MG 24 hr tablet Take 1 tablet (120 mg total) by mouth daily. 30 tablet 0  . levothyroxine (SYNTHROID, LEVOTHROID) 50 MCG  tablet TAKE 1 TABLET DAILY 90 tablet 3  . nitroGLYCERIN (NITROSTAT) 0.4 MG SL tablet PLACE 1 TABLET UNDER THE TONGUE AT ONSET OF CHEST PAIN EVERY 5 MINTUES UP TO 3 TIMES AS NEEDED 25 tablet 1  . pantoprazole (PROTONIX) 40 MG tablet TAKE 1 TABLET DAILY 30 tablet 5  . rosuvastatin (CRESTOR) 10 MG tablet Take 1 tablet (10 mg total) by mouth daily. 90 tablet 3  . torsemide (DEMADEX) 20 MG tablet Take 1 tablet (20 mg total) by mouth 2 (two) times daily. 60 tablet 5  . clopidogrel (PLAVIX) 75 MG tablet TAKE 1 TABLET DAILY 30 tablet 2   No current facility-administered medications for this visit.     Functional Status: In your present state of health, do you have any difficulty performing the following activities: 03/05/2018 12/22/2017  Hearing? N N  Vision? N N  Difficulty concentrating or making decisions? N N  Walking or climbing stairs? Y Y  Dressing or bathing? Y Y  Doing errands, shopping? Y Y  Some recent data might be hidden    Fall/Depression Screening: Fall Risk  01/30/2018 01/02/2018 12/01/2017  Falls in the past year? No No No  Comment - - -  Number falls in past yr: - - -  Injury with Fall? - - -  Comment - - -  Risk Factor Category  - - -  Risk for fall due to : - - -  Risk for fall due to: Comment - - -  Follow up - - -   PHQ 2/9 Scores 01/30/2018 01/02/2018 12/01/2017 11/16/2017 10/23/2017 10/09/2017 09/18/2017  PHQ - 2 Score 0 0 3 0 0 0 1  PHQ- 9 Score - - 8 - - - -   ASSESSMENT: Date Discharged from Hospital: 03/05/18 Date Medication Reconciliation Performed: 03/08/2018  New Medications at Discharge: (delete if applicable)  Hydralazine 50 mg by mouth every 8 hours.  Patient was recently discharged from hospital and all medications have been reviewed  Drugs sorted by system:  Cardiovascular: aspirin, carvedilol, clonidine, clopidogrel, hydralazine, isosorbide mononitrate, rosuvastatin, torsemide , nitroglycerin  Gastrointestinal: pantoprazole  Endocrine: insulin NPH,  levothyroxine  Topical: diclofenac sodium gel  Pain: hydrocodone/APAP  Vitamins/Minerals: cholecalciferol   Medications to avoid in the elderly:  Per [employer], Pantoprazole, has increased risk for C. Diff infection, bone loss and fractures  In the elderly.  There is strong recommendation to avoid use > 8 weeks unless patient is at risk (oral steroids or chronic NSAID, hypersecretory condition, erosive esophagitis, etc.)  Medication Issues:  Patient's serum creatinine was 3.'53mg'$ /dL which calculates to a normalized CrCl of 13.5 ml/min.  Per Rosuvastatin manufacturer, the dose should be decreased to 5 mg daily when CrCl <64m/min.  Assessment: Medication Adherence: Ms. BCarollomay benefit from [facility] to educate her about diabetes, the importance of eating regular meals  and taking her medications as prescribed.    Patient's daughter verbalized that she would like to see if doctor would switch patient to a long acting insulin to help avoid the swings in glucose daily and to avoid multiple injections/day.     I encouraged the daughter to keep a glucose and blood pressure log, which will be very useful to the doctor as he manages her diabetes and hypertension.  I will also allow her to monitor her mother closer and may allow her to make an doctors's appointment to avoid and ED visit if she sees a change in trends. Of note, patient has been admitted for hypertensive emergencies in Dec 2018 and Jan 2019.  Encouraged daughter to buy glucose tablets for her mother for emergencies.   Medication Assistance: Patient does not qualify for Extra Help LIS.  Per [employer] she has a TROOP of $482.45.  If long acting insulin needed, suggest Basaglar from [employer] because they do not have an out-of-pocket expense that must be met to qualify for patient assistance.    Plan: Referral to [facility] health coach.  Route note to PCP, Dr. [name], and see if he feels that patient  may benefit from switching to the long acting insulin Environmental health practitioner).  Recommend decreasing rosuvastin to 5 mg daily based on patient's normalized CrCl < 13m/min.  JJoetta Manners PharmD Clinical Pharmacist TSt. [phone]

## 2018-03-09 ENCOUNTER — Other Ambulatory Visit: Payer: Self-pay | Admitting: *Deleted

## 2018-03-09 NOTE — Patient Outreach (Signed)
Referral received from hospital liason, MD office provides transition of care, pt hospitalized with anemia, has history hypertension, diabetes, CKD stage 4, CAD, telephone call to pt, spoke with pt, HIPAA verified, pt reports her daughter left for a few hours and RN CM will need to schedule home visit with her daughter and requests RN CM call daughter later today.  Pt states " I've got everything I need as far as I know"  Screening completed.  Patient's daughter Iline Oven called back, initial home visit scheduled for next week.  PLAN- see pt for initial home visit next week  Jacqlyn Larsen [facility], [place] Coordinator [phone]

## 2018-03-12 ENCOUNTER — Other Ambulatory Visit: Payer: Self-pay

## 2018-03-12 DIAGNOSIS — I130 Hypertensive heart and chronic kidney disease with heart failure and stage 1 through stage 4 chronic kidney disease, or unspecified chronic kidney disease: Secondary | ICD-10-CM | POA: Diagnosis not present

## 2018-03-12 DIAGNOSIS — Z8673 Personal history of transient ischemic attack (TIA), and cerebral infarction without residual deficits: Secondary | ICD-10-CM | POA: Diagnosis not present

## 2018-03-12 DIAGNOSIS — M6281 Muscle weakness (generalized): Secondary | ICD-10-CM | POA: Diagnosis not present

## 2018-03-12 DIAGNOSIS — S80922D Unspecified superficial injury of left lower leg, subsequent encounter: Secondary | ICD-10-CM | POA: Diagnosis not present

## 2018-03-12 DIAGNOSIS — D631 Anemia in chronic kidney disease: Secondary | ICD-10-CM | POA: Diagnosis not present

## 2018-03-12 DIAGNOSIS — Z7982 Long term (current) use of aspirin: Secondary | ICD-10-CM | POA: Diagnosis not present

## 2018-03-12 DIAGNOSIS — I5032 Chronic diastolic (congestive) heart failure: Secondary | ICD-10-CM | POA: Diagnosis not present

## 2018-03-12 DIAGNOSIS — Z794 Long term (current) use of insulin: Secondary | ICD-10-CM | POA: Diagnosis not present

## 2018-03-12 DIAGNOSIS — N184 Chronic kidney disease, stage 4 (severe): Secondary | ICD-10-CM | POA: Diagnosis not present

## 2018-03-12 DIAGNOSIS — I2510 Atherosclerotic heart disease of native coronary artery without angina pectoris: Secondary | ICD-10-CM | POA: Diagnosis not present

## 2018-03-12 DIAGNOSIS — Z7902 Long term (current) use of antithrombotics/antiplatelets: Secondary | ICD-10-CM | POA: Diagnosis not present

## 2018-03-12 DIAGNOSIS — E1122 Type 2 diabetes mellitus with diabetic chronic kidney disease: Secondary | ICD-10-CM | POA: Diagnosis not present

## 2018-03-12 NOTE — Patient Outreach (Signed)
[facility]) Care Management  03/12/2018  [name] [birth year] [id number]   Referral received from [facility].  Per chart review community nurse Jacqlyn Larsen RN is active with the patient.  Plan:  RN Health Coach will close the case due to activity with the community nurse.  Lazaro Arms RN, BSN, [place] Direct Dial:  [phone]  Fax: [phone]

## 2018-03-12 NOTE — Telephone Encounter (Signed)
Patient went to ER 4/8

## 2018-03-13 ENCOUNTER — Ambulatory Visit (INDEPENDENT_AMBULATORY_CARE_PROVIDER_SITE_OTHER): Payer: Medicare Other | Admitting: Family Medicine

## 2018-03-13 ENCOUNTER — Encounter: Payer: Self-pay | Admitting: Family Medicine

## 2018-03-13 ENCOUNTER — Telehealth: Payer: Self-pay | Admitting: Family Medicine

## 2018-03-13 ENCOUNTER — Other Ambulatory Visit: Payer: Self-pay | Admitting: Family Medicine

## 2018-03-13 VITALS — BP 186/71 | HR 71 | Temp 96.3°F | Ht 67.0 in | Wt 145.0 lb

## 2018-03-13 DIAGNOSIS — K610 Anal abscess: Secondary | ICD-10-CM | POA: Diagnosis not present

## 2018-03-13 DIAGNOSIS — E139 Other specified diabetes mellitus without complications: Secondary | ICD-10-CM | POA: Diagnosis not present

## 2018-03-13 DIAGNOSIS — S81812A Laceration without foreign body, left lower leg, initial encounter: Secondary | ICD-10-CM

## 2018-03-13 DIAGNOSIS — N184 Chronic kidney disease, stage 4 (severe): Secondary | ICD-10-CM

## 2018-03-13 DIAGNOSIS — R269 Unspecified abnormalities of gait and mobility: Secondary | ICD-10-CM | POA: Diagnosis not present

## 2018-03-13 DIAGNOSIS — D649 Anemia, unspecified: Secondary | ICD-10-CM | POA: Diagnosis not present

## 2018-03-13 DIAGNOSIS — I5033 Acute on chronic diastolic (congestive) heart failure: Secondary | ICD-10-CM | POA: Diagnosis not present

## 2018-03-13 DIAGNOSIS — N183 Chronic kidney disease, stage 3 (moderate): Secondary | ICD-10-CM | POA: Diagnosis not present

## 2018-03-13 MED ORDER — BASAGLAR KWIKPEN 100 UNIT/ML ~~LOC~~ SOPN: 10.0000 [IU] | PEN_INJECTOR | Freq: Every day | SUBCUTANEOUS | 3 refills | Status: DC

## 2018-03-13 MED ORDER — ROSUVASTATIN CALCIUM 5 MG PO TABS: 5.0000 mg | ORAL_TABLET | Freq: Every day | ORAL | 1 refills | Status: DC

## 2018-03-13 NOTE — Telephone Encounter (Signed)
Patient's grandaughter aware of medication change from Humulin N to Novolin N

## 2018-03-13 NOTE — Progress Notes (Signed)
   HPI  Patient presents today for hospital follow-up.  Patient was seen in the emergency room for fatigue and leg swelling, she was found to have acute anemia with hemoglobin of 6.6. She denies any bleeding or any signs of bleeding before that were since that time.  She has had a left lower leg skin tear which they state bled quite a bit.  She has not had any active bleeding from that recently.  Patient does not want to pursue dialysis.  She has had difficulty getting to the doctor lately due to transportation issues.  PMH: Smoking status noted ROS: Per HPI  Objective: BP (!) 186/71   Pulse 71   Temp (!) 96.3 F (35.7 C) (Oral)   Ht '5\' 7"'$  (1.702 m)   Wt 145 lb (65.8 kg)   BMI 22.71 kg/m  Gen: NAD, alert, cooperative with exam HEENT: NCAT CV: RRR, good S1/S2, no murmur Resp: Nonlabored, soft crackles bilateral bases Abd: SNTND, BS present, no guarding or organomegaly Ext: 2+ pitting edema bilateral lower extremity, left lower extremity appears less edematous through the area that was compressed with Covan for her skin tear, edema is comparable bilaterally and calf circumference is 32 on the right and 30.5 on the left. 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  Orders Placed This Encounter  Procedures  . BMP8+EGFR  . CBC with [place], MD [place]  [facility] 03/13/2018, 10:27 AM

## 2018-03-13 NOTE — Telephone Encounter (Signed)
Spoke with Anderson Malta - she has been working with patient for medical assistance.  Patient is currently on NPH BID and when Anderson Malta spoke with patients daughter she states that sometimes patient does not take both doses.  Anderson Malta is wanting to know if patient can be switched to a long acting daily insulin like Bsaglar? If aggred she would fax [place] a form to fill out and patient would hopefully get approved for the patient assistance through drug company.  Anderson Malta also noticed that the patient is on Crestor 10mg  and was wondering if she could get changed to 5mg  due to her creatine number. Please advise.

## 2018-03-13 NOTE — Telephone Encounter (Signed)
Daughter aware and verbalizes understanding. 

## 2018-03-13 NOTE — Telephone Encounter (Signed)
Working with [name], [place] from [facility]- I appreciate her recommendations.    Changing Crestor to 5 mg due to her decreased creatinine clearance.  I will ask nursing to communicate this change.  She can try half a tablet of what she has until she runs out.  Also they have recommended that she change from NPH to basal insulin, they believe she can have medication assistance which today will help facilitate, appreciate their recommendations and assistance.  Laroy Apple, MD [place] [facility] 03/13/2018, 3:47 PM

## 2018-03-14 DIAGNOSIS — N184 Chronic kidney disease, stage 4 (severe): Secondary | ICD-10-CM | POA: Diagnosis not present

## 2018-03-14 DIAGNOSIS — Z794 Long term (current) use of insulin: Secondary | ICD-10-CM | POA: Diagnosis not present

## 2018-03-14 DIAGNOSIS — I5032 Chronic diastolic (congestive) heart failure: Secondary | ICD-10-CM | POA: Diagnosis not present

## 2018-03-14 DIAGNOSIS — Z7902 Long term (current) use of antithrombotics/antiplatelets: Secondary | ICD-10-CM | POA: Diagnosis not present

## 2018-03-14 DIAGNOSIS — Z8673 Personal history of transient ischemic attack (TIA), and cerebral infarction without residual deficits: Secondary | ICD-10-CM | POA: Diagnosis not present

## 2018-03-14 DIAGNOSIS — D631 Anemia in chronic kidney disease: Secondary | ICD-10-CM | POA: Diagnosis not present

## 2018-03-14 DIAGNOSIS — I2510 Atherosclerotic heart disease of native coronary artery without angina pectoris: Secondary | ICD-10-CM | POA: Diagnosis not present

## 2018-03-14 DIAGNOSIS — M6281 Muscle weakness (generalized): Secondary | ICD-10-CM | POA: Diagnosis not present

## 2018-03-14 DIAGNOSIS — S80922D Unspecified superficial injury of left lower leg, subsequent encounter: Secondary | ICD-10-CM | POA: Diagnosis not present

## 2018-03-14 DIAGNOSIS — E1122 Type 2 diabetes mellitus with diabetic chronic kidney disease: Secondary | ICD-10-CM | POA: Diagnosis not present

## 2018-03-14 DIAGNOSIS — I130 Hypertensive heart and chronic kidney disease with heart failure and stage 1 through stage 4 chronic kidney disease, or unspecified chronic kidney disease: Secondary | ICD-10-CM | POA: Diagnosis not present

## 2018-03-14 DIAGNOSIS — Z7982 Long term (current) use of aspirin: Secondary | ICD-10-CM | POA: Diagnosis not present

## 2018-03-14 LAB — CBC WITH DIFFERENTIAL/PLATELET
BASOS ABS: 0 10*3/uL (ref 0.0–0.2)
Basos: 1 %
EOS (ABSOLUTE): 0.1 10*3/uL (ref 0.0–0.4)
EOS: 4 %
HEMATOCRIT: 31.1 % — AB (ref 34.0–46.6)
Hemoglobin: 10.1 g/dL — ABNORMAL LOW (ref 11.1–15.9)
Immature Grans (Abs): 0 10*3/uL (ref 0.0–0.1)
Immature Granulocytes: 0 %
Lymphocytes Absolute: 0.9 10*3/uL (ref 0.7–3.1)
Lymphs: 22 %
MCH: 29.7 pg (ref 26.6–33.0)
MCHC: 32.5 g/dL (ref 31.5–35.7)
MCV: 92 fL (ref 79–97)
MONOS ABS: 0.3 10*3/uL (ref 0.1–0.9)
Monocytes: 7 %
NEUTROS ABS: 2.7 10*3/uL (ref 1.4–7.0)
Neutrophils: 66 %
Platelets: 148 10*3/uL — ABNORMAL LOW (ref 150–379)
RBC: 3.4 x10E6/uL — ABNORMAL LOW (ref 3.77–5.28)
RDW: 14.4 % (ref 12.3–15.4)
WBC: 4 10*3/uL (ref 3.4–10.8)

## 2018-03-14 LAB — BMP8+EGFR
BUN / CREAT RATIO: 17 (ref 12–28)
BUN: 57 mg/dL — ABNORMAL HIGH (ref 8–27)
CHLORIDE: 105 mmol/L (ref 96–106)
CO2: 22 mmol/L (ref 20–29)
Calcium: 9.3 mg/dL (ref 8.7–10.3)
Creatinine, Ser: 3.41 mg/dL (ref 0.57–1.00)
GFR calc Af Amer: 14 mL/min/{1.73_m2} — ABNORMAL LOW (ref >59)
GFR calc non Af Amer: 12 mL/min/{1.73_m2} — ABNORMAL LOW (ref >59)
GLUCOSE: 151 mg/dL — AB (ref 65–99)
Potassium: 4.3 mmol/L (ref 3.5–5.2)
SODIUM: 143 mmol/L (ref 134–144)

## 2018-03-15 ENCOUNTER — Encounter: Payer: Self-pay | Admitting: *Deleted

## 2018-03-15 ENCOUNTER — Other Ambulatory Visit: Payer: Self-pay | Admitting: *Deleted

## 2018-03-15 DIAGNOSIS — Z7902 Long term (current) use of antithrombotics/antiplatelets: Secondary | ICD-10-CM | POA: Diagnosis not present

## 2018-03-15 DIAGNOSIS — D631 Anemia in chronic kidney disease: Secondary | ICD-10-CM | POA: Diagnosis not present

## 2018-03-15 NOTE — Patient Outreach (Signed)
[name]) Care Management   03/15/2018  [name] Aug 12, 1932 [id number]  [name] is an 84 y.o. female  Subjective: Initial home visit with pt, HIPAA verified, daughter [name] present and reports pt does not weigh daily, she has a scale but safety issue to try and weigh, pt walks "a little" per Veranda, uses walker, gave her wheelchair away because she never uses it, stays in lift chair most of day.  CBG checked 2-3 times daily, does not record, readings mostly in 100's range. Pt on 60 ounces fluid restrictions.  Veranda feels that most important issue for pt is anemia/ CKD and diet citing pt likes to "eat lots and lots of potatoes and french fries", reports bought Salt substitute to give pt.  Objective:   Vitals:   03/15/18 1406  BP: (!) 128/58  Pulse: 63  Resp: 18  SpO2: 97%  Weight: 145 lb (65.8 kg)  Height: 1.6 m (5\' 3" )   ROS  Physical Exam  Constitutional: She is oriented to person, place, and time. She appears well-developed and well-nourished.  HENT:  Head: Normocephalic.  Neck: Normal range of motion. Neck supple.  Cardiovascular: Normal rate and regular rhythm.  Respiratory: Effort normal and breath sounds normal.  GI: Soft. Bowel sounds are normal.  Musculoskeletal: Normal range of motion. She exhibits edema.  3+ RLE 1-2+ LLE  Neurological: She is alert and oriented to person, place, and time.  forgetfulness  Skin: Skin is warm and dry.  Skin tear to LLE with bandage intact  Psychiatric: She has a normal mood and affect. Her behavior is normal. Thought content normal.    Encounter Medications:   Outpatient Encounter Medications as of 03/15/2018  Medication Sig Note  . aspirin EC 81 MG EC tablet Take 1 tablet (81 mg total) by mouth daily.   . carvedilol (COREG) 12.5 MG tablet Take 1 tablet (12.5 mg total) by mouth 2 (two) times daily with a meal.   . cholecalciferol (VITAMIN D) 1000 units tablet Take 1,000 Units by mouth daily.    . cloNIDine (CATAPRES) 0.2 MG tablet Take 0.2-0.4 mg by mouth See admin instructions. Give two tablets in the morning and one tablet in the evening   . clopidogrel (PLAVIX) 75 MG tablet TAKE 1 TABLET DAILY   . diclofenac sodium (VOLTAREN) 1 % GEL Apply 4 g topically 4 (four) times daily. (Patient taking differently: Apply 4 g topically 4 (four) times daily as needed (FOR PAIN). ) 11/24/2017: Patient still has if needed but has not taken recently  . glucose blood (ACCU-CHEK AVIVA PLUS) test strip CHECK BLOOD SUGER UP TO 3 TIMES A DAY   . hydrALAZINE (APRESOLINE) 50 MG tablet Take 1 tablet (50 mg total) by mouth every 8 (eight) hours.   . insulin NPH Human (NOVOLIN N) 100 UNIT/ML injection Inject 0.1 mLs (10 Units total) into the skin 2 (two) times daily before a meal. 03/05/2018: If blood sugars are less than 100 in the mornings, insulin is not administered. Per daughter, she sometimes exceeds 10 units for evening doses due to elevated blood sugar levels  . isosorbide mononitrate (IMDUR) 120 MG 24 hr tablet Take 1 tablet (120 mg total) by mouth daily.   Marland Kitchen levothyroxine (SYNTHROID, LEVOTHROID) 50 MCG tablet TAKE 1 TABLET DAILY   . nitroGLYCERIN (NITROSTAT) 0.4 MG SL tablet PLACE 1 TABLET UNDER THE TONGUE AT ONSET OF CHEST PAIN EVERY 5 MINTUES UP TO 3 TIMES AS NEEDED   . pantoprazole (PROTONIX)  40 MG tablet TAKE 1 TABLET DAILY   . rosuvastatin (CRESTOR) 5 MG tablet Take 1 tablet (5 mg total) by mouth daily.   Marland Kitchen torsemide (DEMADEX) 20 MG tablet Take 1 tablet (20 mg total) by mouth 2 (two) times daily.   Marland Kitchen HYDROcodone-acetaminophen (NORCO) 5-325 MG tablet Take 0.5-1 tablets every 6 (six) hours as needed by mouth for moderate pain. (Patient not taking: Reported on 03/15/2018)   . Insulin Glargine (BASAGLAR KWIKPEN) 100 UNIT/ML SOPN Inject 0.1 mLs (10 Units total) into the skin at bedtime. (Patient not taking: Reported on 03/15/2018)    No facility-administered encounter medications on file as of 03/15/2018.      Functional Status:   In your present state of health, do you have any difficulty performing the following activities: 03/15/2018 03/05/2018  Hearing? N N  Vision? N N  Difficulty concentrating or making decisions? N N  Walking or climbing stairs? Y Y  Dressing or bathing? Y Y  Doing errands, shopping? Tempie Donning  Preparing Food and eating ? Y -  Using the Toilet? Y -  In the past six months, have you accidently leaked urine? Y -  Do you have problems with loss of bowel control? N -  Managing your Medications? Y -  Managing your Finances? Y -  Housekeeping or managing your Housekeeping? Y -  Some recent data might be hidden    Fall/Depression Screening:    Fall Risk  03/15/2018 03/13/2018 01/30/2018  Falls in the past year? Yes No No  Comment - - -  Number falls in past yr: 2 or more - -  Injury with Fall? No - -  Comment - - -  Risk Factor Category  High Fall Risk - -  Risk for fall due to : Impaired balance/gait;History of fall(s);Impaired mobility - -  Risk for fall due to: Comment - - -  Follow up Falls evaluation completed;Education provided - -   PHQ 2/9 Scores 03/15/2018 03/13/2018 03/09/2018 01/30/2018 01/02/2018 12/01/2017 11/16/2017  PHQ - 2 Score 0 0 0 0 0 3 0  PHQ- 9 Score - - - - - 8 -    Assessment:  RN CM reviewed medications with patient's daughter as she oversees all medications, [facility] pharmacist involved due to costs of insulin 160$ per month per daughter), gave [facility] calendar, 24 hour nurse line magnet, EMMI handouts, home health continues to work with pt. Pt saw primary care MD 03/13/18 and had bloodwork done.  RN CM reviewed safety precautions and importance of changing positions q 2 hours.  RN CM faxed initial home visit and barrier letter to primary MD Dr. Kenn File.  THN CM Care Plan Problem One     Most Recent Value  Care Plan Problem One  Difficulty managing chronic health conditions (DM, anemia, CKD)  Care Plan for Problem One  Active  THN Long Term Goal   Pt,  daughter will verbalize/ demonstrate improved self care within 60 days  THN Long Term Goal Start Date  03/15/18  [facility] CM Short Term Goal #1   Pt will choose more nutritious food (pt eats alot of french fries, potatoes), adhere to plate method within 30 days  THN CM Short Term Goal #1 Start Date  03/15/18  Interventions for Short Term Goal #1  RN CM reviewed nutritious food choices, importance of limiting carbohydrates, reviewed plate method    THN CM Care Plan Problem Two     Most Recent Value  Care Plan Problem Two  Pt non-adherent to plan of care related to CKD  Role Documenting the Problem Two  Care Management [place] for Problem Two  Active  THN CM Short Term Goal #1   Pt will adhere to medication regime, will name 2 ways to preserve kidney function, will discuss with MD use of dietary supplements such as Salt substitute within 30 days  THN CM Short Term Goal #1 Start Date  03/15/18  Interventions for Short Term Goal #2   RN CM reviewed importance of talking with MD before starting salt substitute (potassium), reviewed importance of keeping CBG and blood pressure within normal limits to preserve kidney function, importancew of taking medications as precribed.      Plan: follow up with home visit next month  Jacqlyn Larsen [facility], [place] Coordinator [phone]

## 2018-03-16 ENCOUNTER — Other Ambulatory Visit: Payer: Self-pay

## 2018-03-16 NOTE — Patient Outreach (Signed)
[place] [facility]) Care Management  03/16/2018  [name] May 09, 1932 [id number]   84 year old female referred to [place] Management by [facility] hospital liason.  [place] services requested for medication assistance, medication management and medication adherence.Marland Kitchen  PMHx includes, but not limited to, hypertension, paroxysmal atrial fibrillation, CAD, cerebrovascular disease, diabetes mellitus, hyperlipidemia, anemia,   Successful outreach call placed to [name]'s daughter, [name].   HIPPA identifiers verified.   Medication Management: Per [name] approval, patient should decrease daily rosuvastatin dose to 5 mg daily based on manufacturer's recommendation when CrCl <64ml/min.  Patient's daughter verbalized understanding of decreased dosage.    Medication Assistance: Per last note, [name] has agreed to allow switch to [place] long acting insulin, Basaglar 10 units daily, if patient medication assistance can be arranged through [employer].    [name] and patient's daughter both aware that if patient assistance application is denied, Shelby affordable option would be to remain on NPH.    Plan:  I will route patient assistance letter, to [name], [name], to begin application process for [employer].   Follow up with [name] in 2 weeks.   [name], PharmD Clinical Pharmacist [place] [phone]

## 2018-03-22 DIAGNOSIS — E1122 Type 2 diabetes mellitus with diabetic chronic kidney disease: Secondary | ICD-10-CM | POA: Diagnosis not present

## 2018-03-22 DIAGNOSIS — Z794 Long term (current) use of insulin: Secondary | ICD-10-CM | POA: Diagnosis not present

## 2018-03-22 DIAGNOSIS — D631 Anemia in chronic kidney disease: Secondary | ICD-10-CM | POA: Diagnosis not present

## 2018-03-22 DIAGNOSIS — Z7982 Long term (current) use of aspirin: Secondary | ICD-10-CM | POA: Diagnosis not present

## 2018-03-22 DIAGNOSIS — I2510 Atherosclerotic heart disease of native coronary artery without angina pectoris: Secondary | ICD-10-CM | POA: Diagnosis not present

## 2018-03-22 DIAGNOSIS — Z7902 Long term (current) use of antithrombotics/antiplatelets: Secondary | ICD-10-CM | POA: Diagnosis not present

## 2018-03-22 DIAGNOSIS — S80922D Unspecified superficial injury of left lower leg, subsequent encounter: Secondary | ICD-10-CM | POA: Diagnosis not present

## 2018-03-22 DIAGNOSIS — I130 Hypertensive heart and chronic kidney disease with heart failure and stage 1 through stage 4 chronic kidney disease, or unspecified chronic kidney disease: Secondary | ICD-10-CM | POA: Diagnosis not present

## 2018-03-22 DIAGNOSIS — M6281 Muscle weakness (generalized): Secondary | ICD-10-CM | POA: Diagnosis not present

## 2018-03-22 DIAGNOSIS — I5032 Chronic diastolic (congestive) heart failure: Secondary | ICD-10-CM | POA: Diagnosis not present

## 2018-03-22 DIAGNOSIS — Z8673 Personal history of transient ischemic attack (TIA), and cerebral infarction without residual deficits: Secondary | ICD-10-CM | POA: Diagnosis not present

## 2018-03-22 DIAGNOSIS — N184 Chronic kidney disease, stage 4 (severe): Secondary | ICD-10-CM | POA: Diagnosis not present

## 2018-03-26 DIAGNOSIS — Z7902 Long term (current) use of antithrombotics/antiplatelets: Secondary | ICD-10-CM | POA: Diagnosis not present

## 2018-03-26 DIAGNOSIS — N183 Chronic kidney disease, stage 3 (moderate): Secondary | ICD-10-CM | POA: Diagnosis not present

## 2018-03-26 DIAGNOSIS — I130 Hypertensive heart and chronic kidney disease with heart failure and stage 1 through stage 4 chronic kidney disease, or unspecified chronic kidney disease: Secondary | ICD-10-CM | POA: Diagnosis not present

## 2018-03-26 DIAGNOSIS — D631 Anemia in chronic kidney disease: Secondary | ICD-10-CM | POA: Diagnosis not present

## 2018-03-26 DIAGNOSIS — M6281 Muscle weakness (generalized): Secondary | ICD-10-CM | POA: Diagnosis not present

## 2018-03-26 DIAGNOSIS — Z794 Long term (current) use of insulin: Secondary | ICD-10-CM | POA: Diagnosis not present

## 2018-03-26 DIAGNOSIS — N184 Chronic kidney disease, stage 4 (severe): Secondary | ICD-10-CM | POA: Diagnosis not present

## 2018-03-26 DIAGNOSIS — I5033 Acute on chronic diastolic (congestive) heart failure: Secondary | ICD-10-CM | POA: Diagnosis not present

## 2018-03-26 DIAGNOSIS — I2510 Atherosclerotic heart disease of native coronary artery without angina pectoris: Secondary | ICD-10-CM | POA: Diagnosis not present

## 2018-03-26 DIAGNOSIS — I5032 Chronic diastolic (congestive) heart failure: Secondary | ICD-10-CM | POA: Diagnosis not present

## 2018-03-26 DIAGNOSIS — S80922D Unspecified superficial injury of left lower leg, subsequent encounter: Secondary | ICD-10-CM | POA: Diagnosis not present

## 2018-03-26 DIAGNOSIS — Z8673 Personal history of transient ischemic attack (TIA), and cerebral infarction without residual deficits: Secondary | ICD-10-CM | POA: Diagnosis not present

## 2018-03-26 DIAGNOSIS — E1122 Type 2 diabetes mellitus with diabetic chronic kidney disease: Secondary | ICD-10-CM | POA: Diagnosis not present

## 2018-03-26 DIAGNOSIS — Z7982 Long term (current) use of aspirin: Secondary | ICD-10-CM | POA: Diagnosis not present

## 2018-03-27 ENCOUNTER — Ambulatory Visit (INDEPENDENT_AMBULATORY_CARE_PROVIDER_SITE_OTHER): Payer: Medicare Other

## 2018-03-27 ENCOUNTER — Other Ambulatory Visit: Payer: Self-pay | Admitting: Pharmacy Technician

## 2018-03-27 DIAGNOSIS — D631 Anemia in chronic kidney disease: Secondary | ICD-10-CM

## 2018-03-27 DIAGNOSIS — S80922D Unspecified superficial injury of left lower leg, subsequent encounter: Secondary | ICD-10-CM

## 2018-03-27 DIAGNOSIS — M6281 Muscle weakness (generalized): Secondary | ICD-10-CM

## 2018-03-27 DIAGNOSIS — E1122 Type 2 diabetes mellitus with diabetic chronic kidney disease: Secondary | ICD-10-CM | POA: Diagnosis not present

## 2018-03-27 NOTE — Patient Outreach (Signed)
[place] [facility]) Care Management  03/27/2018  [name] [place] Oct 18, 1932 [id number]   Received [name] C[employer] patient assistance referral from [facility] on 04/22. Mailed patient portion out on 04/23 and faxed provider portion to Dr. [name] on 04/22.  Will follow up with patient in 7-10 days to verify receipt of application.  Maud Deed [place], [place] Management [phone]

## 2018-03-29 ENCOUNTER — Encounter: Payer: Self-pay | Admitting: Family Medicine

## 2018-03-29 ENCOUNTER — Ambulatory Visit (INDEPENDENT_AMBULATORY_CARE_PROVIDER_SITE_OTHER): Payer: Medicare Other | Admitting: Family Medicine

## 2018-03-29 VITALS — BP 149/54 | HR 60 | Temp 97.1°F | Ht 63.0 in | Wt 140.8 lb

## 2018-03-29 DIAGNOSIS — R531 Weakness: Secondary | ICD-10-CM

## 2018-03-29 DIAGNOSIS — E118 Type 2 diabetes mellitus with unspecified complications: Secondary | ICD-10-CM

## 2018-03-29 DIAGNOSIS — Z794 Long term (current) use of insulin: Secondary | ICD-10-CM | POA: Diagnosis not present

## 2018-03-29 DIAGNOSIS — IMO0001 Reserved for inherently not codable concepts without codable children: Secondary | ICD-10-CM

## 2018-03-29 LAB — BAYER DCA HB A1C WAIVED: HB A1C: 5.9 % (ref <7.0)

## 2018-03-29 NOTE — Progress Notes (Signed)
   HPI  Patient presents today for follow-up diabetes as well as continued weakness.  Patient states over the last week or so she has had weakness and fatigue.  She states her legs get tired while she is walking. She denies shortness of breath, cough, or any other new concerning symptoms.  Diabetes Using NPH once daily, usually gets second shot as well Avg fasting CBG is 150-180 She is working with the pharmacist to try to get her basaglar  PMH: Smoking status noted ROS: Per HPI  Objective: BP (!) 149/54   Pulse 60   Temp (!) 97.1 F (36.2 C) (Oral)   Ht _0  (1.6 m)   Wt 140 lb 12.8 oz (63.9 kg)   BMI 24.94 kg/m  Gen: NAD, alert, cooperative with exam HEENT: NCAT CV: RRR, good S1/S2, no murmur Resp: CTABL, no wheezes, non-labored Ext: No edema, warm Neuro: Alert and oriented, No gross deficits  Assessment and plan:  # T2DM Previously well controlled, awaiting A1C Trying to change to basaglar from NPH due to compliance, [facility] pharmacist has been working through the process Careful dosing given poor renal function Denies Hypoglycemia  # weakness Unclear etiology UA, CBC, BMP- No red flags but has not seen improvement since admission.     Orders Placed This Encounter  Procedures  . Urine Culture  . CBC with Differential/Platelet  . BMP8+EGFR  . Urinalysis, Complete  . [facility] Hb A1c Carney Bern, MD [place] [facility] 03/29/2018, 4:46 PM

## 2018-03-30 ENCOUNTER — Other Ambulatory Visit: Payer: Self-pay

## 2018-03-30 ENCOUNTER — Other Ambulatory Visit: Payer: Medicare Other

## 2018-03-30 ENCOUNTER — Other Ambulatory Visit: Payer: Self-pay | Admitting: Pharmacy Technician

## 2018-03-30 ENCOUNTER — Other Ambulatory Visit: Payer: Self-pay | Admitting: *Deleted

## 2018-03-30 DIAGNOSIS — R531 Weakness: Secondary | ICD-10-CM

## 2018-03-30 DIAGNOSIS — D649 Anemia, unspecified: Secondary | ICD-10-CM

## 2018-03-30 LAB — CBC WITH DIFFERENTIAL/PLATELET
BASOS ABS: 0 10*3/uL (ref 0.0–0.2)
Basophils Absolute: 0 10*3/uL (ref 0.0–0.2)
Basos: 1 %
Basos: 0 %
EOS (ABSOLUTE): 0.1 10*3/uL (ref 0.0–0.4)
EOS (ABSOLUTE): 0.1 10*3/uL (ref 0.0–0.4)
EOS: 4 %
Eos: 4 %
HEMATOCRIT: 25 % — AB (ref 34.0–46.6)
HEMOGLOBIN: 7.9 g/dL — AB (ref 11.1–15.9)
Hematocrit: 24.1 % — ABNORMAL LOW (ref 34.0–46.6)
Hemoglobin: 8 g/dL — CL (ref 11.1–15.9)
Immature Grans (Abs): 0 10*3/uL (ref 0.0–0.1)
Immature Granulocytes: 0 %
LYMPHS ABS: 0.6 10*3/uL — AB (ref 0.7–3.1)
LYMPHS ABS: 0.7 10*3/uL (ref 0.7–3.1)
Lymphs: 17 %
Lymphs: 21 %
MCH: 28.7 pg (ref 26.6–33.0)
MCH: 29 pg (ref 26.6–33.0)
MCHC: 31.6 g/dL (ref 31.5–35.7)
MCHC: 33.2 g/dL (ref 31.5–35.7)
MCV: 91 fL (ref 79–97)
MCV: 87 fL (ref 79–97)
MONOCYTES: 9 %
MONOS ABS: 0.3 10*3/uL (ref 0.1–0.9)
MONOS ABS: 0.3 10*3/uL (ref 0.1–0.9)
Monocytes: 11 %
NEUTROS ABS: 2.4 10*3/uL (ref 1.4–7.0)
Neutrophils Absolute: 2 10*3/uL (ref 1.4–7.0)
Neutrophils: 69 %
Neutrophils: 64 %
PLATELETS: 146 10*3/uL — AB (ref 150–379)
PLATELETS: 140 10*3/uL — AB (ref 150–379)
RBC: 2.75 x10E6/uL — AB (ref 3.77–5.28)
RBC: 2.76 x10E6/uL — AB (ref 3.77–5.28)
RDW: 13.7 % (ref 12.3–15.4)
RDW: 13.5 % (ref 12.3–15.4)
WBC: 3.5 10*3/uL (ref 3.4–10.8)
WBC: 3.1 10*3/uL — ABNORMAL LOW (ref 3.4–10.8)

## 2018-03-30 LAB — BMP8+EGFR
BUN/Creatinine Ratio: 14 (ref 12–28)
BUN: 56 mg/dL — AB (ref 8–27)
CALCIUM: 9.1 mg/dL (ref 8.7–10.3)
CHLORIDE: 104 mmol/L (ref 96–106)
CO2: 21 mmol/L (ref 20–29)
CREATININE: 3.99 mg/dL — AB (ref 0.57–1.00)
GFR, EST AFRICAN AMERICAN: 11 mL/min/{1.73_m2} — AB (ref >59)
GFR, EST NON AFRICAN AMERICAN: 10 mL/min/{1.73_m2} — AB (ref >59)
Glucose: 176 mg/dL — ABNORMAL HIGH (ref 65–99)
Potassium: 4.6 mmol/L (ref 3.5–5.2)
Sodium: 142 mmol/L (ref 134–144)

## 2018-03-30 NOTE — Patient Outreach (Signed)
[facility] [facility]) Care Management  03/30/2018  [name] December 09, 1931 [id number]   Successful outreach call ton patients daughter, [name], HIPAA identifiers verified. Veranda administers patients medications. She stated they received the [name] application that was mailed to them however she is not sure that her mother would be approved due to receiving a check from the [place] from the death of her husband. I informed her that I would clarify if that is considered a source of "income" or not.  Will contact Veranda on Monday 05/06 to clarify  [name], [place] Management [phone]

## 2018-04-02 ENCOUNTER — Encounter (HOSPITAL_COMMUNITY): Payer: Self-pay | Admitting: Emergency Medicine

## 2018-04-02 ENCOUNTER — Other Ambulatory Visit: Payer: Self-pay

## 2018-04-02 ENCOUNTER — Other Ambulatory Visit: Payer: Self-pay | Admitting: Pharmacy Technician

## 2018-04-02 ENCOUNTER — Ambulatory Visit (INDEPENDENT_AMBULATORY_CARE_PROVIDER_SITE_OTHER): Payer: Medicare Other | Admitting: Family Medicine

## 2018-04-02 ENCOUNTER — Encounter: Payer: Self-pay | Admitting: Family Medicine

## 2018-04-02 ENCOUNTER — Ambulatory Visit: Payer: Medicare Other | Admitting: *Deleted

## 2018-04-02 VITALS — BP 164/59 | HR 68 | Temp 98.0°F | Ht 63.0 in | Wt 138.4 lb

## 2018-04-02 DIAGNOSIS — D649 Anemia, unspecified: Secondary | ICD-10-CM

## 2018-04-02 NOTE — Progress Notes (Signed)
   HPI  Patient presents today here for continued weakness.  States that she feels worse than she did last week.  She feels cold, she feels weak. She states her appetite is poor.  She denies fever, chills, sweats, cough, or worsening breathing.  She had an episode of chest pain this morning and it resolved with 2 nitroglycerin tablets. No current chest pain.  Patient states that she does not want to go to the hospital. She states that she does not want chest compressions or intubation, however she does not want to go back to hospice care.    PMH: Smoking status noted ROS: Per HPI  Objective: BP (!) 164/59   Pulse 68   Temp 98 F (36.7 C) (Oral)   Ht 5\' 3"  (1.6 m)   Wt 138 lb 6.4 oz (62.8 kg)   SpO2 97%   BMI 24.52 kg/m  Gen: NAD, alert, cooperative with exam HEENT: NCAT CV: RRR, good S1/S2, loud 3/6 to 4/6 systolic murmur Resp: CTABL, no wheezes, non-labored Neuro: Alert and oriented, No gross deficits  Assessment and plan:  #Normocytic anemia Patient with anemia have burning around 8.0. Discussed transfusion threshold and reasons to seek emergency medical care Recommended that if she has another episode of chest pain that she go to the hospital. Patient is not bleeding anywhere. She does likely need erythropoietin CBC, due to her wishes I will try to get transfusion with short stay rather than emergency room tomorrow if her hemoglobin is less than 8  Refer to [facility] to consider palliative care discussion  Orders Placed This Encounter  Procedures  . CBC with Differential/Platelet  . AMB Referral to [place] Management    Referral Priority:   Routine    Referral Type:   Consultation    Referral Reason:   [facility]-Care Management    Number of Visits Requested:   Cla[place], MD [place] 04/02/2018, 2:51 PM

## 2018-04-02 NOTE — Patient Outreach (Signed)
[place] [facility]) Care Management  04/02/2018  Carman Auxier [name] [birth year] [id number]   Successful outreach call to patients daughter, [name], HIPAA identifiers verified. Informed Veranda that the check her mother receives from the [place] on behalf of her late husband does count as income. Veranda states that patient has been having issues recently with Hemoglobin and that Dr. [name] has been running tests. Patient has an appointment this evening and per Veranda "I think she might be hospitalized". She states that for right now she is going to wait to start the [name] patient assistance application process until they figure out what is going on with her mom. I informed her to just let me know when and if she wants to start the application process.  Will route note to [facility] for case closure. Will also [place] to update them on patient.  Maud Deed [place], [place] Management [phone]

## 2018-04-02 NOTE — Patient Instructions (Signed)
Great to see you!   

## 2018-04-02 NOTE — Patient Outreach (Signed)
[name]  04/02/2018  [name] 08/01/1932 [id number]  84year old femalereferred to [name] by [facility] hospital liason. [place] services requested for medication assistance, medication [name] and medication adherence.Marland Kitchen PMHx includes, but not limited to, hypertension, paroxysmal atrial fibrillation, CAD, cerebrovascular disease, diabetes mellitus, hyperlipidemia and anemia.  Patient's daughter, [name], states that she does not want to proceed with the [name] patient assistance application at this time.  She verbalized that her mom might be hospitalized by this evening and she wants to hold off with the application process.    [name], CPhT, informed daughter to notify her if/when she wishes to proceed with [name] patient assistance program.  Plan: Close [name] and route discipline closure letter to PCP, Dr. [name].   Notify [name] of Pharmacy case closure.  [name], PharmD Clinical Pharmacist [place] [phone]

## 2018-04-03 ENCOUNTER — Encounter (HOSPITAL_COMMUNITY)
Admission: RE | Admit: 2018-04-03 | Discharge: 2018-04-03 | Disposition: A | Discharge status: Home / Self Care | Payer: Medicare Other | Source: Ambulatory Visit | Attending: Family Medicine | Admitting: Family Medicine

## 2018-04-03 DIAGNOSIS — D649 Anemia, unspecified: Secondary | ICD-10-CM | POA: Insufficient documentation

## 2018-04-03 LAB — CBC WITH DIFFERENTIAL/PLATELET
BASOS ABS: 0 10*3/uL (ref 0.0–0.2)
Basos: 1 %
EOS (ABSOLUTE): 0.2 10*3/uL (ref 0.0–0.4)
Eos: 5 %
Hematocrit: 24.5 % — ABNORMAL LOW (ref 34.0–46.6)
Hemoglobin: 7.9 g/dL — CL (ref 11.1–15.9)
Immature Grans (Abs): 0 10*3/uL (ref 0.0–0.1)
Immature Granulocytes: 0 %
Lymphocytes Absolute: 0.7 10*3/uL (ref 0.7–3.1)
Lymphs: 20 %
MCH: 29.2 pg (ref 26.6–33.0)
MCHC: 32.2 g/dL (ref 31.5–35.7)
MCV: 90 fL (ref 79–97)
MONOS ABS: 0.3 10*3/uL (ref 0.1–0.9)
Monocytes: 8 %
Neutrophils Absolute: 2.1 10*3/uL (ref 1.4–7.0)
Neutrophils: 66 %
Platelets: 147 10*3/uL — ABNORMAL LOW (ref 150–379)
RBC: 2.71 x10E6/uL — AB (ref 3.77–5.28)
RDW: 13.8 % (ref 12.3–15.4)
WBC: 3.2 10*3/uL — AB (ref 3.4–10.8)

## 2018-04-03 LAB — HEMOGLOBIN AND HEMATOCRIT, BLOOD
HCT: 24.9 % — ABNORMAL LOW (ref 36.0–46.0)
Hemoglobin: 7.7 g/dL — ABNORMAL LOW (ref 12.0–15.0)

## 2018-04-03 LAB — PREPARE RBC (CROSSMATCH)

## 2018-04-03 NOTE — Progress Notes (Signed)
Here for blood to be drawn for T&C for transfusion tomorrow. Blood drawn and sent to lab for results. Consent signed. Daughter at side.

## 2018-04-04 ENCOUNTER — Encounter (HOSPITAL_COMMUNITY)
Admission: RE | Admit: 2018-04-04 | Discharge: 2018-04-04 | Disposition: A | Discharge status: Home / Self Care | Payer: Medicare Other | Source: Ambulatory Visit | Attending: Family Medicine | Admitting: Family Medicine

## 2018-04-04 DIAGNOSIS — D649 Anemia, unspecified: Secondary | ICD-10-CM | POA: Diagnosis not present

## 2018-04-04 MED ORDER — FUROSEMIDE 10 MG/ML IJ SOLN
40.0000 mg | Freq: Once | INTRAMUSCULAR | Status: AC
  Administered 2018-04-04: 40 mg via INTRAVENOUS

## 2018-04-04 MED ORDER — FUROSEMIDE 10 MG/ML IJ SOLN
INTRAMUSCULAR | Status: AC
  Filled 2018-04-04: qty 4

## 2018-04-04 MED ORDER — SODIUM CHLORIDE 0.9 % IV SOLN
Freq: Once | INTRAVENOUS | Status: AC
  Administered 2018-04-04: 250 mL via INTRAVENOUS

## 2018-04-04 NOTE — Progress Notes (Signed)
Results for ADISSON, DEAK (MRN [id number]) as of 04/04/2018 07:37  Ref. Range 04/03/2018 14:47  Hemoglobin Latest Ref Range: 12.0 - 15.0 g/dL 7.7 (L)  HCT Latest Ref Range: 36.0 - 46.0 % 24.9 (L)

## 2018-04-05 ENCOUNTER — Other Ambulatory Visit: Payer: Self-pay

## 2018-04-05 DIAGNOSIS — D649 Anemia, unspecified: Secondary | ICD-10-CM

## 2018-04-05 LAB — BPAM RBC
BLOOD PRODUCT EXPIRATION DATE: 201906052359
BLOOD PRODUCT EXPIRATION DATE: 201906052359
ISSUE DATE / TIME: 201905081004
ISSUE DATE / TIME: 201905080753
UNIT TYPE AND RH: 6200
Unit Type and Rh: 6200

## 2018-04-05 LAB — TYPE AND SCREEN
ABO/RH(D): A POS
ANTIBODY SCREEN: NEGATIVE
UNIT DIVISION: 0
UNIT DIVISION: 0

## 2018-04-06 ENCOUNTER — Other Ambulatory Visit: Payer: Medicare Other

## 2018-04-06 DIAGNOSIS — R531 Weakness: Secondary | ICD-10-CM | POA: Diagnosis not present

## 2018-04-06 DIAGNOSIS — D649 Anemia, unspecified: Secondary | ICD-10-CM

## 2018-04-06 LAB — CBC WITH DIFFERENTIAL/PLATELET
Basophils Absolute: 0 10*3/uL (ref 0.0–0.2)
Basos: 1 %
EOS (ABSOLUTE): 0.1 10*3/uL (ref 0.0–0.4)
Eos: 4 %
Hematocrit: 34.3 % (ref 34.0–46.6)
Hemoglobin: 11.5 g/dL (ref 11.1–15.9)
IMMATURE GRANULOCYTES: 0 %
Immature Grans (Abs): 0 10*3/uL (ref 0.0–0.1)
Lymphocytes Absolute: 0.9 10*3/uL (ref 0.7–3.1)
Lymphs: 26 %
MCH: 29 pg (ref 26.6–33.0)
MCHC: 33.5 g/dL (ref 31.5–35.7)
MCV: 87 fL (ref 79–97)
Monocytes Absolute: 0.4 10*3/uL (ref 0.1–0.9)
Monocytes: 10 %
NEUTROS PCT: 59 %
Neutrophils Absolute: 2.1 10*3/uL (ref 1.4–7.0)
Platelets: 162 10*3/uL (ref 150–379)
RBC: 3.96 x10E6/uL (ref 3.77–5.28)
RDW: 14.1 % (ref 12.3–15.4)
WBC: 3.6 10*3/uL (ref 3.4–10.8)

## 2018-04-09 IMAGING — DX DG CHEST 2V
2 series · 2 of 2 positions shown · non-contrast
Comparison: 11/24/2016, 11/17/2016

CLINICAL DATA: 84-year-old female with a history of chest pain

EXAM:
CHEST  2 VIEW

[chest lat]
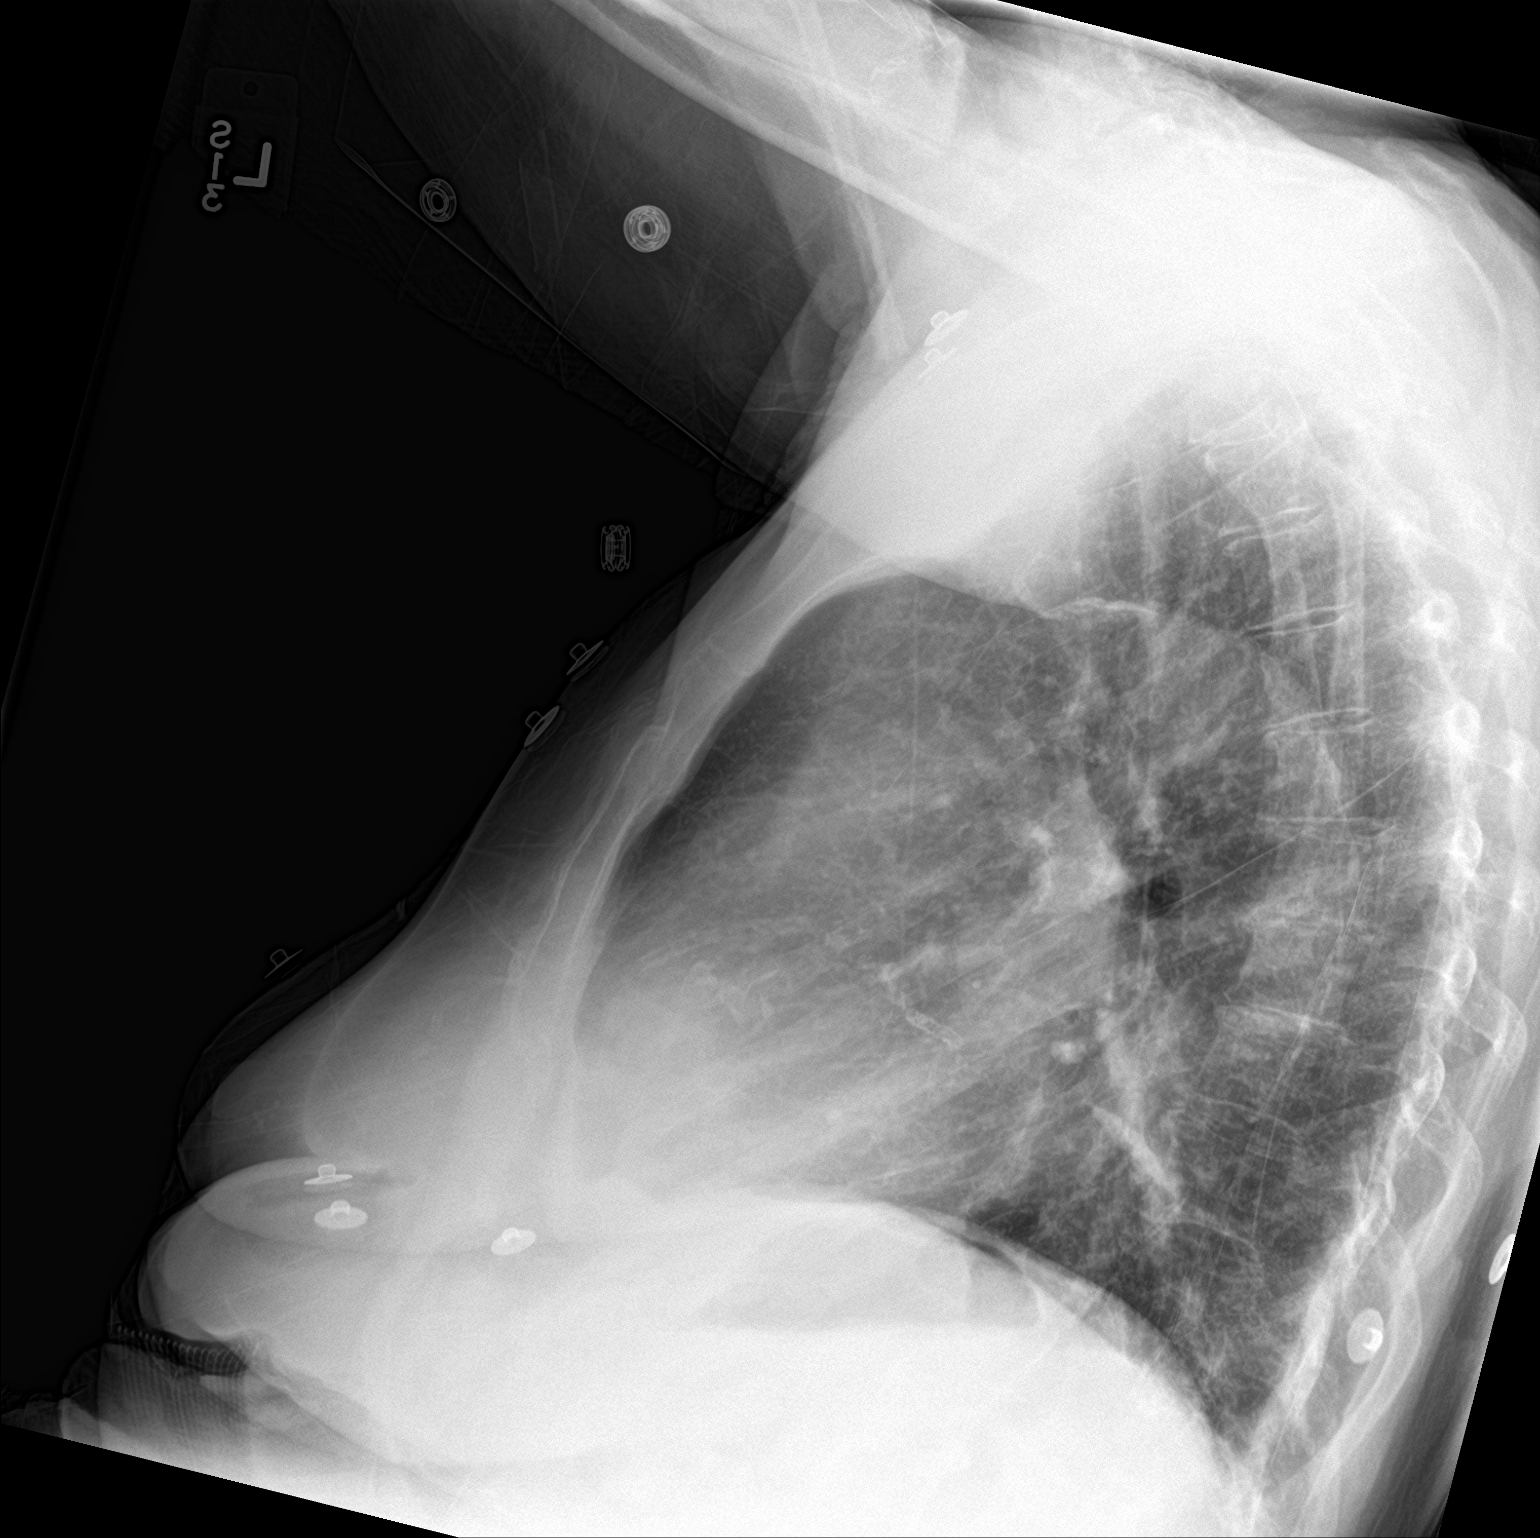

[chest ap]
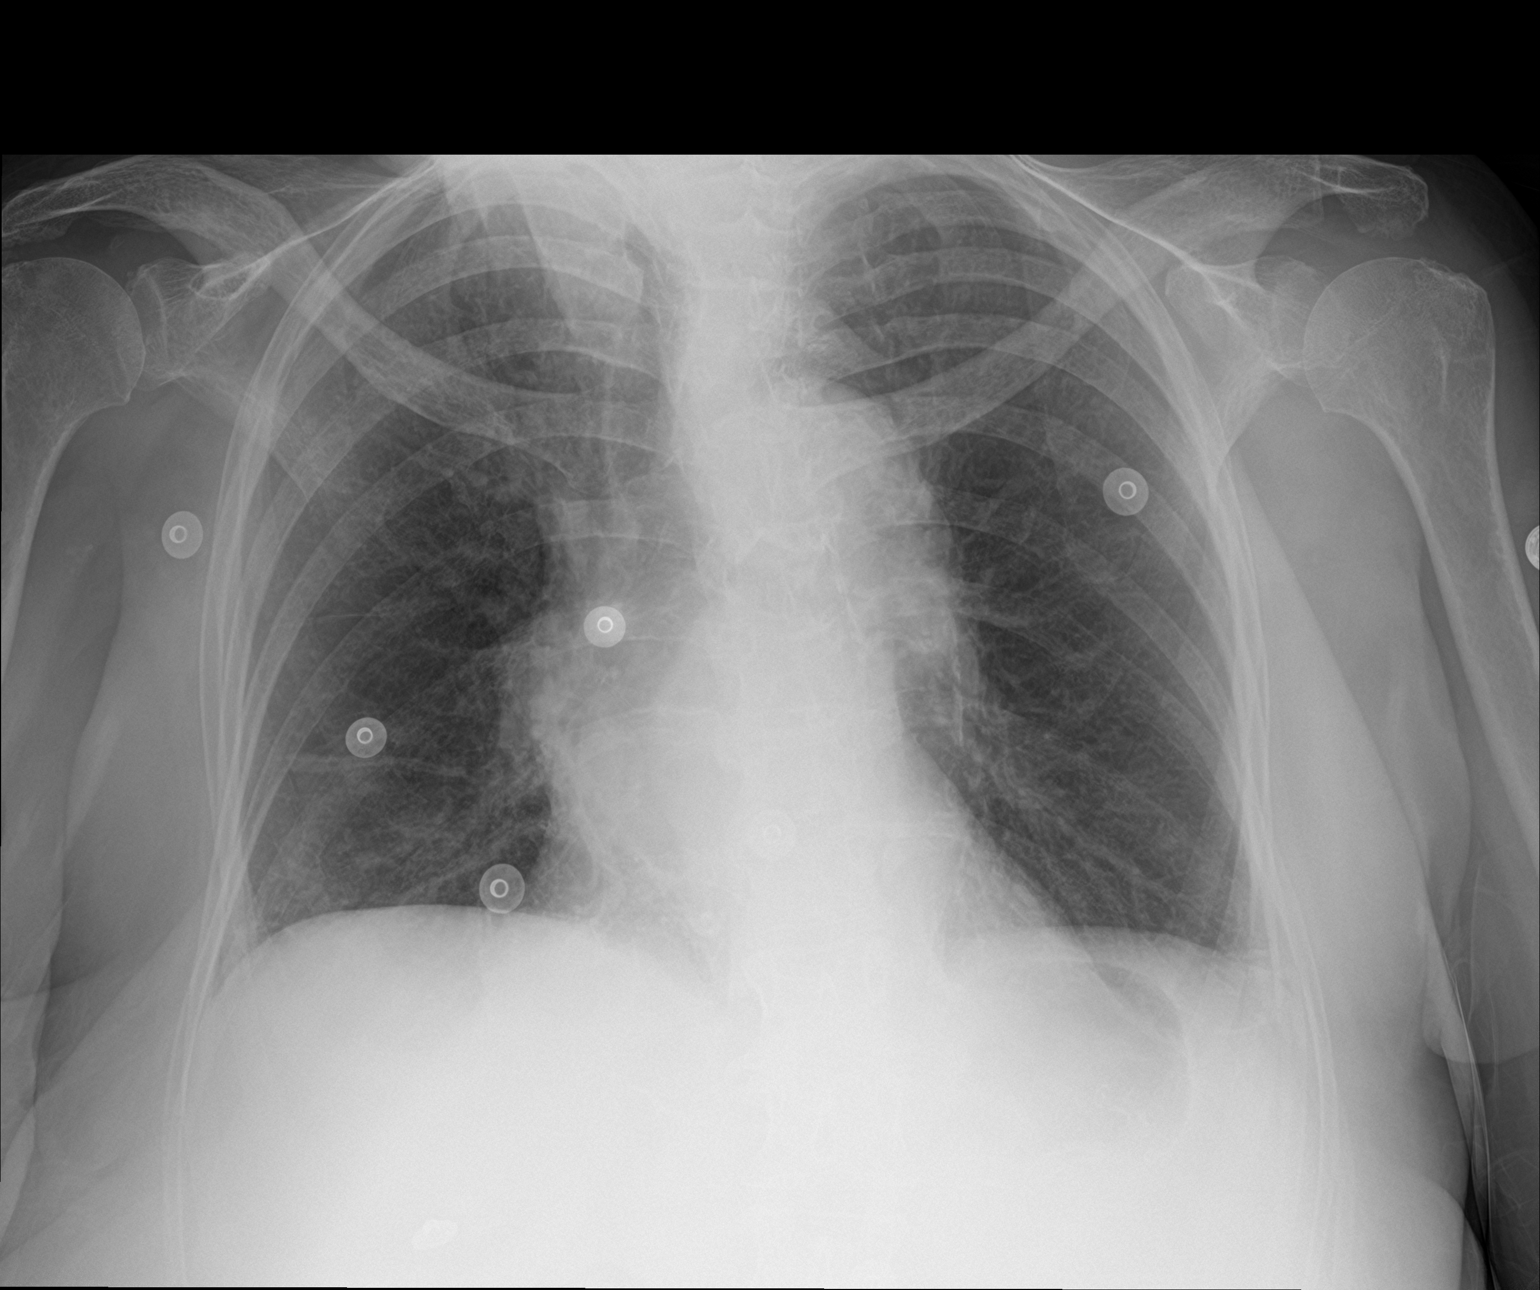

[2 of 2 positions shown; findings below may reference images not displayed]

FINDINGS: Right rotation of the patient, somewhat limits the evaluation.

Cardiomediastinal silhouette likely unchanged. Right hilar region is
accentuated likely secondary to the rotation of the patient.

Calcifications of the aorta.

Low lung volumes.

No pleural effusion or pneumothorax.

Coronary calcifications.

No confluent airspace disease. Coarsened interstitial markings,
similar to prior.
IMPRESSION: Low lung volumes and chronic lung changes without evidence of acute
cardiopulmonary disease.

Aortic atherosclerosis and coronary disease.

## 2018-04-10 ENCOUNTER — Encounter: Payer: Self-pay | Admitting: *Deleted

## 2018-04-10 ENCOUNTER — Other Ambulatory Visit: Payer: Self-pay | Admitting: *Deleted

## 2018-04-10 NOTE — Patient Outreach (Signed)
[facility]) Care Management   04/10/2018  [name] 12/09/1931 [id number]  [name] is an 84 y.o. female  Subjective: Routine home visit with pt, HIPAA verified, daughter [name] present and reports pt received 2 units blood recently for anemia, has been attending all MD appointments, pt not weighing daily as she feels cannot stand up on the scales safely, continues to check CBG daily.  Daughter states " [name] is a DNR, I've got the document here to show you"  Objective:   Vitals:   04/10/18 1350  BP: 122/64  Pulse: (!) 57  Resp: 18  SpO2: 97%  CBG today 113, all readings in 100's range per daughter, one reading of 60 ROS  Physical Exam  Constitutional: She is oriented to person, place, and time. She appears well-developed and well-nourished.  HENT:  Head: Normocephalic.  Neck: Normal range of motion. Neck supple.  Cardiovascular: Normal rate.  Respiratory: Effort normal and breath sounds normal.  GI: Soft. Bowel sounds are normal.  Musculoskeletal: Normal range of motion. She exhibits edema.  2-3+ edema RLE 1+ edema LLE  Neurological: She is alert and oriented to person, place, and time.  Skin: Skin is warm and dry.  Psychiatric: She has a normal mood and affect. Her behavior is normal. Thought content normal.    Encounter Medications:   Outpatient Encounter Medications as of 04/10/2018  Medication Sig Note  . aspirin EC 81 MG EC tablet Take 1 tablet (81 mg total) by mouth daily.   . carvedilol (COREG) 12.5 MG tablet Take 1 tablet (12.5 mg total) by mouth 2 (two) times daily with a meal.   . cholecalciferol (VITAMIN D) 1000 units tablet Take 1,000 Units by mouth daily.   . cloNIDine (CATAPRES) 0.2 MG tablet Take 0.2-0.4 mg by mouth See admin instructions. Give two tablets in the morning and one tablet in the evening   . clopidogrel (PLAVIX) 75 MG tablet TAKE 1 TABLET DAILY   . diclofenac sodium (VOLTAREN) 1 % GEL Apply 4 g topically 4 (four)  times daily. (Patient taking differently: Apply 4 g topically 4 (four) times daily as needed (FOR PAIN). ) 11/24/2017: Patient still has if needed but has not taken recently  . glucose blood (ACCU-CHEK AVIVA PLUS) test strip CHECK BLOOD SUGER UP TO 3 TIMES A DAY   . HYDROcodone-acetaminophen (NORCO) 5-325 MG tablet Take 0.5-1 tablets every 6 (six) hours as needed by mouth for moderate pain.   . Insulin Glargine (BASAGLAR KWIKPEN) 100 UNIT/ML SOPN Inject 0.1 mLs (10 Units total) into the skin at bedtime.   . insulin NPH Human (NOVOLIN N) 100 UNIT/ML injection Inject 0.1 mLs (10 Units total) into the skin 2 (two) times daily before a meal. 03/05/2018: If blood sugars are less than 100 in the mornings, insulin is not administered. Per daughter, she sometimes exceeds 10 units for evening doses due to elevated blood sugar levels  . isosorbide mononitrate (IMDUR) 120 MG 24 hr tablet Take 1 tablet (120 mg total) by mouth daily.   Marland Kitchen levothyroxine (SYNTHROID, LEVOTHROID) 50 MCG tablet TAKE 1 TABLET DAILY   . nitroGLYCERIN (NITROSTAT) 0.4 MG SL tablet PLACE 1 TABLET UNDER THE TONGUE AT ONSET OF CHEST PAIN EVERY 5 MINTUES UP TO 3 TIMES AS NEEDED   . rosuvastatin (CRESTOR) 5 MG tablet Take 1 tablet (5 mg total) by mouth daily.   Marland Kitchen torsemide (DEMADEX) 20 MG tablet Take 1 tablet (20 mg total) by mouth 2 (two) times daily.   Marland Kitchen  hydrALAZINE (APRESOLINE) 50 MG tablet Take 1 tablet (50 mg total) by mouth every 8 (eight) hours.   . pantoprazole (PROTONIX) 40 MG tablet TAKE 1 TABLET DAILY    No facility-administered encounter medications on file as of 04/10/2018.     Functional Status:   In your present state of health, do you have any difficulty performing the following activities: 03/15/2018 03/05/2018  Hearing? N N  Vision? N N  Difficulty concentrating or making decisions? N N  Walking or climbing stairs? Y Y  Dressing or bathing? Y Y  Doing errands, shopping? Tempie Donning  Preparing Food and eating ? Y -  Using the  Toilet? Y -  In the past six months, have you accidently leaked urine? Y -  Do you have problems with loss of bowel control? N -  Managing your Medications? Y -  Managing your Finances? Y -  Housekeeping or managing your Housekeeping? Y -  Some recent data might be hidden    Fall/Depression Screening:    Fall Risk  03/29/2018 03/15/2018 03/13/2018  Falls in the past year? Yes Yes No  Comment - - -  Number falls in past yr: 2 or more 2 or more -  Injury with Fall? No No -  Comment - - -  Risk Factor Category  High Fall Risk High Fall Risk -  Risk for fall due to : - Impaired balance/gait;History of fall(s);Impaired mobility -  Risk for fall due to: Comment - - -  Follow up - Falls evaluation completed;Education provided -   [facility] 2/9 Scores 03/29/2018 03/15/2018 03/13/2018 03/09/2018 01/30/2018 01/02/2018 12/01/2017  PHQ - 2 Score 0 0 0 0 0 0 3  PHQ- 9 Score - - - - - - 8    Assessment:  Pt does have DNR document with no expiration date completed when pt was previously on hospice services, RN CM ask daughter to display where visible. Per primary care MD request, RN CM discussed advanced directives/ palliative care, pt previously refused to complete any of these documents in the past, pt states she will " consider" a living will,  RN CM did not have living will document on hand but will get the documents and take by patient's home, there is not a palliative care program in [facility], there is a supportive care program and they will outreach pt.   RN CM sent in basket to primary care MD Dr. Laroy Apple with update on palliative care/ advanced directives.  THN CM Care Plan Problem One     Most Recent Value  Care Plan Problem One  Difficulty managing chronic health conditions (DM, anemia, CKD)  Role Documenting the Problem One  Care Management [place] for Problem One  Active  THN Long Term Goal   Pt, daughter will verbalize/ demonstrate improved self care within 60 days  THN Long  Term Goal Start Date  03/15/18  Interventions for Problem One Long Term Goal  RNCM reviewed advanced care planning/ palliative care, pt refuses to complete HCPOA but states she will consider a living will, pt refuses hospice services citing she has had before, is interested in supportive care program, telephone call to Supportive care program (through [facility] spoke with Olean Ree, referral given, they will contact pt.    THN CM Short Term Goal #1   Pt will choose more nutritious food (pt eats alot of french fries, potatoes), adhere to plate method within 30 days  THN CM  Short Term Goal #1 Start Date  04/10/18 [goal re-established]  Interventions for Short Term Goal #1  Pt is still not choosing wisely for food choices, she is eating alot of french fries, fast food,  RN CM ask pt to eat more home cooked meals and choose fresh and frozen if possilbe, reviewed foods high in sodium to limit/ avoid.    THN CM Care Plan Problem Two     Most Recent Value  Care Plan Problem Two  Pt non-adherent to plan of care related to CKD  Role Documenting the Problem Two  Care Management Coordinator  Care Plan for Problem Two  Active  THN CM Short Term Goal #1   Pt will adhere to medication regime, will name 2 ways to preserve kidney function, will discuss with MD use of dietary supplements such as Salt substitute within 30 days  THN CM Short Term Goal #1 Start Date  03/15/18  [facility] CM Short Term Goal #1 Met Date   04/10/18  Interventions for Short Term Goal #2   RNCM ask pt/ daughter to always discuss important issues with MD, reviewed importance of keeping blood sugar, blood pressure within normal limits help preserve kidney function.      Plan: see pt for home visit next month Take documents to patient's home for completion of living will  Jacqlyn Larsen [facility] [place] Coordinator [phone]

## 2018-04-13 ENCOUNTER — Other Ambulatory Visit: Payer: Self-pay

## 2018-04-13 ENCOUNTER — Inpatient Hospital Stay (HOSPITAL_COMMUNITY): Payer: Medicare Other | Attending: Internal Medicine | Admitting: Internal Medicine

## 2018-04-13 ENCOUNTER — Encounter (HOSPITAL_COMMUNITY): Payer: Self-pay | Admitting: Internal Medicine

## 2018-04-13 VITALS — BP 177/44 | HR 57 | Temp 98.0°F | Resp 16

## 2018-04-13 DIAGNOSIS — N184 Chronic kidney disease, stage 4 (severe): Secondary | ICD-10-CM

## 2018-04-13 DIAGNOSIS — I10 Essential (primary) hypertension: Secondary | ICD-10-CM

## 2018-04-13 DIAGNOSIS — D259 Leiomyoma of uterus, unspecified: Secondary | ICD-10-CM

## 2018-04-13 DIAGNOSIS — R1310 Dysphagia, unspecified: Secondary | ICD-10-CM | POA: Insufficient documentation

## 2018-04-13 DIAGNOSIS — D649 Anemia, unspecified: Secondary | ICD-10-CM | POA: Diagnosis not present

## 2018-04-13 DIAGNOSIS — D72819 Decreased white blood cell count, unspecified: Secondary | ICD-10-CM | POA: Insufficient documentation

## 2018-04-13 NOTE — Progress Notes (Addendum)
Referring physician:  Dr. [name] CC Dr. [name].  Diagnosis Normocytic anemia - Plan: CBC with Differential/Platelet, Comprehensive metabolic panel, Lactate dehydrogenase, Ferritin, Protein electrophoresis, serum, Vitamin B12, Folate, Haptoglobin  Staging Cancer Staging No matching staging information was found for the patient.  Assessment and Plan: 1.  Normocytic anemia.  84 year old female who was referred by Dr. [name]  for anemia.  She had labs done on 03/29/2018 that showed a white count 3.5 hemoglobin 7.9 platelets 146,000.  Chemistries at that time were within normal limits other than a creatinine of 4.  She was recently transfused on 04/03/2018.  Labs were repeated on 04/06/2018 and showed a white count 3.6 hemoglobin 11.5 MCV 87 platelets 162,000.  Had a normal differential.  She denies any blood in her stool or her urine.  She reports occasional problems swallowing solids and liquids.  She has problems with bruising easily and some neuropathy.  She denies any smoking history.  There is a reported family history of pancreatic cancer.  Patient is seen today for consultation due to anemia.  Discussion held with patient and her daughter today.  I discussed with them patient was noted to be severely anemic on labs done Mar 29, 2018 when she had a hemoglobin of 7.9.  She was transfused on 04/03/2018 and hemoglobin is improved at 11.5 on labs done Apr 06, 2018.  Reports occasional bruising.  He also reports trouble swallowing.  Review of records indicate she had a CT of the chest, abdomen and pelvis without contrast done 03/04/2017 that showed IMPRESSION: 1. Walls of the esophagus appear somewhat thickened throughout suggesting a diffuse esophagitis of infectious or inflammatory nature. 2. Mild atelectasis/scarring at each lung base. No evidence of pneumonia. 3. Diffuse coronary artery calcifications. Recommend correlation with any possible associated cardiac symptoms. 4. Aortic  atherosclerosis. 5. Colonic diverticulosis without evidence of acute diverticulitis. No bowel obstruction. 6. Uterine fibroid measuring approximately 4 cm.  Due to the significant anemia on Mar 29, 2018 when her hemoglobin was 7.9 she is recommended for GI and GYN evaluation.  This is especially due to the thickening noted in the esophagus on that CT as well as the uterine fibroid that was reported on prior imaging.  Hemoglobin is improved at 11.5 on labs done Apr 06, 2018.Marland Kitchen  She will have repeat labs in [redacted] week along with additional labs such as ferritin, haptoglobin, LDH, SPEP, B12, folate for further work-up.  She will return to clinic in 2 weeks for follow-up to go over results.  All questions answered and they expressed understanding of the information presented.  2.  Esophageal thickening.  This was reported on imaging that was done in April 2018.  She is referred to GI for evaluation especially in light of the severe anemia on labs that were done Mar 29, 2018.  3.  Uterine fibroid.  This was reported on imaging done April 2018.  She will be referred to GYN for evaluation.  4.  Renal insufficiency.  Patient has a creatinine of 4 on labs done Mar 29, 2018.  SPEP will be obtained on labs when she returns to clinic in 1 week.  She likely has chronic kidney disease and will need nephrology evaluation.  5.  Hypertension.  Blood pressure is 177/44.  She should continue to follow-up with PCP.  HPI: 84 year old female who was referred by Dr. [name] for anemia.  She had labs done on 03/29/2018 that showed a white count 3.5 hemoglobin 7.9 platelets 146,000.  Chemistries at that  time were within normal limits other than a creatinine of 4.  She was recently transfused on 04/03/2018.  Labs were repeated on 04/06/2018 and showed a white count 3.6 hemoglobin 11.5 MCV 87 platelets 162,000.  Had a normal differential.  She denies any blood in her stool or her urine.  She reports occasional problems swallowing solids  and liquids.  She has problems with bruising easily and some neuropathy.  She denies any smoking history.  There is a reported family history of pancreatic cancer. Patient is seen today for consultation due to anemia.  Problem List Patient Active Problem List   Diagnosis Date Noted  . Acute anemia [D64.9] 03/05/2018  . NSTEMI (non-ST elevated myocardial infarction) ([place]) [I21.4] 12/22/2017  . Acute encephalopathy [G93.40] 10/15/2017  . Acute renal failure superimposed on stage 4 chronic kidney disease ([place]) [N17.9, N18.4] 09/22/2017  . CAD (coronary artery disease), native coronary artery [I25.10] 09/21/2017  . Pleural effusion, bilateral [J90]   . Thrombocytopenia ([place]) [D69.6] 09/20/2017  . Abnormal CT scan of lung [R91.8]   . Chest pain [R07.9] 03/04/2017  . Esophagitis [K20.9]   . Occult blood in stools [R19.5] 11/29/2016  . UTI (urinary tract infection) [N39.0] 11/29/2016  . Urinary retention [R33.9] 11/29/2016  . PAF (paroxysmal atrial fibrillation) ([place]) [I48.0]   . Pressure injury of skin [L89.90] 11/22/2016  . Aortic valve sclerosis-2D June 2016 [I35.8] 10/05/2016  . Chest pain with high risk of acute coronary syndrome [R07.9] 10/05/2016  . 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 Pulmonary hypertension ([place]) [I27.20]   . Insulin dependent diabetes mellitus with complications (HCC) [K53.9, Z79.4]   . Chronic kidney disease, stage IV (severe) ([place]) [N18.4]   . Other constipation [K59.09]   . Failure to thrive in adult [R62.7]   . Hypomagnesemia [E83.42]   . CAD S/P PCI '03 and '08 [I25.10, Z98.61] 05/11/2015  . Anemia in chronic kidney disease [N18.9, D63.1] 05/11/2015  . Benign paroxysmal positional vertigo [H81.10] 03/21/2014  . HTN (hypertension) [I10] 08/09/2013  . Lumbar spondylosis [M47.816] 05/16/2013  . Overweight [E66.3] 05/15/2013  . Varicose veins of lower extremities with other complications [J67.341] 93/79/0240  . Normocytic anemia [D64.9] 10/29/2012   . Hypoglycemia [E16.2] 10/29/2012  . NSVT (nonsustained ventricular tachycardia) ([place]) [I47.2] 10/28/2012  . OA (osteoarthritis) [M19.90] 10/27/2012  . Cerebrovascular disease [I67.9] 03/09/2010  . Hyperlipidemia [E78.5] 12/05/2008    Past Medical History Past Medical History:  Diagnosis Date  . CAD (coronary artery disease) 2003   Last catheterization 2008. Two-vessel PCI of the first OM branch and mid LAD.  Marland Kitchen CKD (chronic kidney disease)   . Congestive heart failure ([place]) 02/2017   grade 2 diastolic dysfunction  . CVA (cerebral vascular accident) ([place]) [place]   . Dementia   . DJD (degenerative joint disease) of lumbar spine   . Hypercholesteremia   . Hypertension   . IDDM (insulin dependent diabetes mellitus) ([place])     Past Surgical History Past Surgical History:  Procedure Laterality Date  . APPENDECTOMY    . KNEE SURGERY      Family History Family History  Problem Relation Age of Onset  . Heart attack Mother 7  . Heart attack Father 29  . Diabetes Brother        RETINOPATHY   . Drug abuse Brother   . Liver cancer Brother   . Breast cancer Daughter   . Diabetes Sister      Social History  reports that she has never smoked.  She has never used smokeless tobacco. She reports that she does not drink alcohol or use drugs.  Medications  Current Outpatient Medications:  .  aspirin EC 81 MG EC tablet, Take 1 tablet (81 mg total) by mouth daily., Disp: 30 tablet, Rfl: 11 .  carvedilol (COREG) 12.5 MG tablet, Take 1 tablet (12.5 mg total) by mouth 2 (two) times daily with a meal., Disp: 60 tablet, Rfl: 3 .  cholecalciferol (VITAMIN D) 1000 units tablet, Take 1,000 Units by mouth daily., Disp: , Rfl:  .  cloNIDine (CATAPRES) 0.2 MG tablet, Take 0.2-0.4 mg by mouth See admin instructions. Give two tablets in the morning and one tablet in the evening, Disp: , Rfl:  .  clopidogrel (PLAVIX) 75 MG tablet, TAKE 1 TABLET DAILY, Disp: 30 tablet, Rfl: 2 .   diclofenac sodium (VOLTAREN) 1 % GEL, Apply 4 g topically 4 (four) times daily. (Patient taking differently: Apply 4 g topically 4 (four) times daily as needed (FOR PAIN). ), Disp: 100 g, Rfl: 2 .  glucose blood (ACCU-CHEK AVIVA PLUS) test strip, CHECK BLOOD SUGER UP TO 3 TIMES A DAY, Disp: 300 each, Rfl: 3 .  HYDROcodone-acetaminophen (NORCO) 5-325 MG tablet, Take 0.5-1 tablets every 6 (six) hours as needed by mouth for moderate pain., Disp: 30 tablet, Rfl: 0 .  Insulin Glargine (BASAGLAR KWIKPEN) 100 UNIT/ML SOPN, Inject 0.1 mLs (10 Units total) into the skin at bedtime., Disp: 3 pen, Rfl: 3 .  insulin NPH Human (NOVOLIN N) 100 UNIT/ML injection, Inject 0.1 mLs (10 Units total) into the skin 2 (two) times daily before a meal., Disp: 10 mL, Rfl: 5 .  isosorbide mononitrate (IMDUR) 120 MG 24 hr tablet, Take 1 tablet (120 mg total) by mouth daily., Disp: 30 tablet, Rfl: 0 .  levothyroxine (SYNTHROID, LEVOTHROID) 50 MCG tablet, TAKE 1 TABLET DAILY, Disp: 90 tablet, Rfl: 3 .  nitroGLYCERIN (NITROSTAT) 0.4 MG SL tablet, PLACE 1 TABLET UNDER THE TONGUE AT ONSET OF CHEST PAIN EVERY 5 MINTUES UP TO 3 TIMES AS NEEDED, Disp: 25 tablet, Rfl: 1 .  pantoprazole (PROTONIX) 40 MG tablet, TAKE 1 TABLET DAILY, Disp: 30 tablet, Rfl: 5 .  rosuvastatin (CRESTOR) 5 MG tablet, Take 1 tablet (5 mg total) by mouth daily., Disp: 90 tablet, Rfl: 1 .  torsemide (DEMADEX) 20 MG tablet, Take 1 tablet (20 mg total) by mouth 2 (two) times daily., Disp: 60 tablet, Rfl: 5 .  hydrALAZINE (APRESOLINE) 50 MG tablet, Take 1 tablet (50 mg total) by mouth every 8 (eight) hours., Disp: 90 tablet, Rfl: 0  Allergies Propoxyphene n-acetaminophen and Simvastatin  Review of Systems Review of Systems - Oncology ROS as per HPI otherwise 12 point ROS is negative other than fatigue, trouble swallowing solids and liquids, neuropathy, bleeding and bruising easily.   Physical Exam  Vitals Wt Readings from Last 3 Encounters:  04/02/18 138 lb  6.4 oz (62.8 kg)  03/29/18 140 lb 12.8 oz (63.9 kg)  03/15/18 145 lb (65.8 kg)   Temp Readings from Last 3 Encounters:  04/13/18 98 F (36.7 C) (Oral)  04/04/18 97.8 F (36.6 C) (Oral)  04/02/18 98 F (36.7 C) (Oral)   BP Readings from Last 3 Encounters:  04/13/18 (!) 177/44  04/10/18 122/64  04/04/18 (!) 163/53   Pulse Readings from Last 3 Encounters:  04/13/18 (!) 57  04/10/18 (!) 57  04/04/18 (!) 58    Constitutional: Chronically ill-appearing elderly female in no acute distress. HENT: Head: Normocephalic and atraumatic.  Mouth/Throat:  No oropharyngeal exudate. Mucosa moist. Eyes: Pupils are equal, round, and reactive to light. Conjunctivae are normal. No scleral icterus.  Neck: Normal range of motion. Neck supple. No JVD present.  Cardiovascular: Normal rate, regular rhythm and normal heart sounds.  Exam reveals no gallop and no friction rub.   No murmur heard. Pulmonary/Chest: Effort normal and breath sounds normal. No respiratory distress. No wheezes.No rales.  Abdominal: Soft. Bowel sounds are normal. No distension. There is no tenderness. There is no guarding.  Musculoskeletal: No edema or tenderness.  Patient reports inability to stand well. Lymphadenopathy: No cervical, axillary or supraclavicular adenopathy.  Neurological: Alert and oriented to person, place, and time. No cranial nerve deficit.  Skin: Skin is warm and dry. No rash noted. No erythema. No pallor.  Psychiatric: Affect and judgment normal.   Labs No visits with results within 3 Day(s) from this visit.  Latest known visit with results is:  Appointment on 04/06/2018  Component Date Value Ref Range Status  . WBC 04/06/2018 3.6  3.4 - 10.8 x10E3/uL Final  . RBC 04/06/2018 3.96  3.77 - 5.28 x10E6/uL Final  . Hemoglobin 04/06/2018 11.5  11.1 - 15.9 g/dL Final  . Hematocrit 04/06/2018 34.3  34.0 - 46.6 % Final  . MCV 04/06/2018 87  79 - 97 fL Final  . MCH 04/06/2018 29.0  26.6 - 33.0 pg Final  . MCHC  04/06/2018 33.5  31.5 - 35.7 g/dL Final  . RDW 04/06/2018 14.1  12.3 - 15.4 % Final  . Platelets 04/06/2018 162  150 - 379 x10E3/uL Final  . Neutrophils 04/06/2018 59  Not Estab. % Final  . Lymphs 04/06/2018 26  Not Estab. % Final  . Monocytes 04/06/2018 10  Not Estab. % Final  . Eos 04/06/2018 4  Not Estab. % Final  . Basos 04/06/2018 1  Not Estab. % Final  . Neutrophils Absolute 04/06/2018 2.1  1.4 - 7.0 x10E3/uL Final  . Lymphocytes Absolute 04/06/2018 0.9  0.7 - 3.1 x10E3/uL Final  . Monocytes Absolute 04/06/2018 0.4  0.1 - 0.9 x10E3/uL Final  . EOS (ABSOLUTE) 04/06/2018 0.1  0.0 - 0.4 x10E3/uL Final  . Basophils Absolute 04/06/2018 0.0  0.0 - 0.2 x10E3/uL Final  . Immature Granulocytes 04/06/2018 0  Not Estab. % Final  . Immature Grans (Abs) 04/06/2018 0.0  0.0 - 0.1 x10E3/uL Final     Pathology Orders Placed This Encounter  Procedures  . CBC with Differential/Platelet    Standing Status:   Future    Standing Expiration Date:   04/14/2019  . Comprehensive metabolic panel    Standing Status:   Future    Standing Expiration Date:   04/14/2019  . Lactate dehydrogenase    Standing Status:   Future    Standing Expiration Date:   04/14/2019  . Ferritin    Standing Status:   Future    Standing Expiration Date:   04/14/2019  . Protein electrophoresis, serum    Standing Status:   Future    Standing Expiration Date:   04/14/2019  . Vitamin B12    Standing Status:   Future    Standing Expiration Date:   04/14/2019  . Folate    Standing Status:   Future    Standing Expiration Date:   04/14/2019  . Haptoglobin    Standing Status:   Future    Standing Expiration Date:   04/14/2019       Zoila Shutter MD

## 2018-04-19 ENCOUNTER — Inpatient Hospital Stay (HOSPITAL_COMMUNITY): Payer: Medicare Other

## 2018-04-19 DIAGNOSIS — I10 Essential (primary) hypertension: Secondary | ICD-10-CM | POA: Diagnosis not present

## 2018-04-19 DIAGNOSIS — D649 Anemia, unspecified: Secondary | ICD-10-CM | POA: Diagnosis not present

## 2018-04-19 DIAGNOSIS — R1310 Dysphagia, unspecified: Secondary | ICD-10-CM | POA: Diagnosis not present

## 2018-04-19 DIAGNOSIS — D259 Leiomyoma of uterus, unspecified: Secondary | ICD-10-CM | POA: Diagnosis not present

## 2018-04-19 DIAGNOSIS — D72819 Decreased white blood cell count, unspecified: Secondary | ICD-10-CM | POA: Diagnosis not present

## 2018-04-19 DIAGNOSIS — N184 Chronic kidney disease, stage 4 (severe): Secondary | ICD-10-CM | POA: Diagnosis not present

## 2018-04-19 LAB — CBC WITH DIFFERENTIAL/PLATELET
Basophils Absolute: 0 10*3/uL (ref 0.0–0.1)
Basophils Relative: 0 %
Eosinophils Absolute: 0.1 10*3/uL (ref 0.0–0.7)
Eosinophils Relative: 4 %
HEMATOCRIT: 29.2 % — AB (ref 36.0–46.0)
HEMOGLOBIN: 9.3 g/dL — AB (ref 12.0–15.0)
LYMPHS ABS: 0.7 10*3/uL (ref 0.7–4.0)
LYMPHS PCT: 24 %
MCH: 29 pg (ref 26.0–34.0)
MCHC: 31.8 g/dL (ref 30.0–36.0)
MCV: 91 fL (ref 78.0–100.0)
Monocytes Absolute: 0.3 10*3/uL (ref 0.1–1.0)
Monocytes Relative: 10 %
NEUTROS ABS: 1.9 10*3/uL (ref 1.7–7.7)
Neutrophils Relative %: 62 %
PLATELETS: 146 10*3/uL — AB (ref 150–400)
RBC: 3.21 MIL/uL — AB (ref 3.87–5.11)
RDW: 13.5 % (ref 11.5–15.5)
WBC: 3 10*3/uL — AB (ref 4.0–10.5)

## 2018-04-19 LAB — COMPREHENSIVE METABOLIC PANEL
ALK PHOS: 40 U/L (ref 38–126)
ALT: 11 U/L — AB (ref 14–54)
AST: 11 U/L — AB (ref 15–41)
Albumin: 3.3 g/dL — ABNORMAL LOW (ref 3.5–5.0)
Anion gap: 7 (ref 5–15)
BILIRUBIN TOTAL: 0.5 mg/dL (ref 0.3–1.2)
BUN: 72 mg/dL — AB (ref 6–20)
CALCIUM: 9.3 mg/dL (ref 8.9–10.3)
CHLORIDE: 105 mmol/L (ref 101–111)
CO2: 23 mmol/L (ref 22–32)
CREATININE: 3.99 mg/dL — AB (ref 0.44–1.00)
GFR calc Af Amer: 11 mL/min — ABNORMAL LOW (ref >60)
GFR, EST NON AFRICAN AMERICAN: 10 mL/min — AB (ref >60)
Glucose, Bld: 212 mg/dL — ABNORMAL HIGH (ref 65–99)
Potassium: 6 mmol/L — ABNORMAL HIGH (ref 3.5–5.1)
Sodium: 135 mmol/L (ref 135–145)
Total Protein: 6.1 g/dL — ABNORMAL LOW (ref 6.5–8.1)

## 2018-04-19 LAB — LACTATE DEHYDROGENASE: LDH: 153 U/L (ref 98–192)

## 2018-04-20 LAB — VITAMIN B12: Vitamin B-12: 274 pg/mL (ref 180–914)

## 2018-04-20 LAB — HAPTOGLOBIN: HAPTOGLOBIN: 156 mg/dL (ref 34–200)

## 2018-04-20 LAB — FERRITIN: FERRITIN: 463 ng/mL — AB (ref 11–307)

## 2018-04-20 LAB — FOLATE: FOLATE: 12.2 ng/mL (ref >5.9)

## 2018-04-24 LAB — PROTEIN ELECTROPHORESIS, SERUM
A/G Ratio: 1.4 (ref 0.7–1.7)
ALBUMIN ELP: 3.3 g/dL (ref 2.9–4.4)
Alpha-1-Globulin: 0.2 g/dL (ref 0.0–0.4)
Alpha-2-Globulin: 0.7 g/dL (ref 0.4–1.0)
BETA GLOBULIN: 0.6 g/dL — AB (ref 0.7–1.3)
GAMMA GLOBULIN: 0.7 g/dL (ref 0.4–1.8)
Globulin, Total: 2.3 g/dL (ref 2.2–3.9)
Total Protein ELP: 5.6 g/dL — ABNORMAL LOW (ref 6.0–8.5)

## 2018-04-25 ENCOUNTER — Ambulatory Visit (INDEPENDENT_AMBULATORY_CARE_PROVIDER_SITE_OTHER): Payer: Medicare Other | Admitting: Internal Medicine

## 2018-04-25 ENCOUNTER — Other Ambulatory Visit: Payer: Self-pay | Admitting: *Deleted

## 2018-04-25 DIAGNOSIS — I5033 Acute on chronic diastolic (congestive) heart failure: Secondary | ICD-10-CM | POA: Diagnosis not present

## 2018-04-25 DIAGNOSIS — N183 Chronic kidney disease, stage 3 (moderate): Secondary | ICD-10-CM | POA: Diagnosis not present

## 2018-04-26 ENCOUNTER — Encounter (HOSPITAL_COMMUNITY): Payer: Self-pay | Admitting: Internal Medicine

## 2018-04-26 ENCOUNTER — Ambulatory Visit (INDEPENDENT_AMBULATORY_CARE_PROVIDER_SITE_OTHER): Payer: Medicare Other | Admitting: Internal Medicine

## 2018-04-26 ENCOUNTER — Inpatient Hospital Stay (HOSPITAL_BASED_OUTPATIENT_CLINIC_OR_DEPARTMENT_OTHER): Payer: Medicare Other | Admitting: Internal Medicine

## 2018-04-26 ENCOUNTER — Other Ambulatory Visit (HOSPITAL_COMMUNITY)
Admission: RE | Admit: 2018-04-26 | Discharge: 2018-04-26 | Disposition: A | Discharge status: Home / Self Care | Payer: Medicare Other | Source: Ambulatory Visit | Attending: Internal Medicine | Admitting: Internal Medicine

## 2018-04-26 ENCOUNTER — Encounter (INDEPENDENT_AMBULATORY_CARE_PROVIDER_SITE_OTHER): Payer: Self-pay | Admitting: Internal Medicine

## 2018-04-26 VITALS — BP 220/63 | HR 72 | Temp 98.1°F | Resp 20

## 2018-04-26 VITALS — BP 180/90 | HR 68 | Temp 98.0°F | Ht 61.0 in | Wt 121.0 lb

## 2018-04-26 DIAGNOSIS — D72819 Decreased white blood cell count, unspecified: Secondary | ICD-10-CM | POA: Diagnosis not present

## 2018-04-26 DIAGNOSIS — D259 Leiomyoma of uterus, unspecified: Secondary | ICD-10-CM

## 2018-04-26 DIAGNOSIS — I10 Essential (primary) hypertension: Secondary | ICD-10-CM | POA: Diagnosis not present

## 2018-04-26 DIAGNOSIS — R1310 Dysphagia, unspecified: Secondary | ICD-10-CM

## 2018-04-26 DIAGNOSIS — D649 Anemia, unspecified: Secondary | ICD-10-CM

## 2018-04-26 DIAGNOSIS — N184 Chronic kidney disease, stage 4 (severe): Secondary | ICD-10-CM | POA: Diagnosis not present

## 2018-04-26 LAB — IRON AND TIBC
Iron: 68 ug/dL (ref 28–170)
Saturation Ratios: 32 % — ABNORMAL HIGH (ref 10.4–31.8)
TIBC: 216 ug/dL — AB (ref 250–450)
UIBC: 148 ug/dL

## 2018-04-26 MED ORDER — HYDRALAZINE HCL 50 MG PO TABS: 50.0000 mg | ORAL_TABLET | Freq: Three times a day (TID) | ORAL | 0 refills | Status: AC

## 2018-04-26 MED ORDER — EPOETIN ALFA 40000 UNIT/ML IJ SOLN
INTRAMUSCULAR | Status: AC
  Filled 2018-04-26: qty 1

## 2018-04-26 MED ORDER — EPOETIN ALFA 40000 UNIT/ML IJ SOLN: 40000.0000 [IU] | Freq: Once | INTRAMUSCULAR | Status: DC

## 2018-04-26 NOTE — Progress Notes (Addendum)
Subjective:    Patient ID: [name], female    DOB: October 09, 1932, 84 y.o.   MRN: [id number]    HPI Referred by Dr. Walden Field for normocytic anemia.   Tranfused on 04/03/2018 with 2 units of blood. for hemoglobin of 7.9. Transfused in April x 2 units also.  04/19/2018 ferritin 463. Folate 12.1. 03/06/2018 Iron 63, TIBC 182, Sat 35, UIBC 119 03/05/2018 POC occult blood negative. Admitted in November of 2018 to [facility] but was not interested in an evaluation. She has ? esophageal dysmotility on DG Esophagram.  Daughter states patient has refused colonoscopy and EGD.  Daughter cannot tell me if her mother has ever had a colonoscopy. Her appetite is not good. BM daily. Normal color. No melena or BRRB CBC    Component Value Date/Time   WBC 3.0 (L) 04/19/2018 1231   RBC 3.21 (L) 04/19/2018 1231   HGB 9.3 (L) 04/19/2018 1231   HGB 11.5 04/06/2018 0838   HCT 29.2 (L) 04/19/2018 1231   HCT 34.3 04/06/2018 0838   PLT 146 (L) 04/19/2018 1231   PLT 162 04/06/2018 0838   MCV 91.0 04/19/2018 1231   MCV 87 04/06/2018 0838   MCH 29.0 04/19/2018 1231   MCHC 31.8 04/19/2018 1231   RDW 13.5 04/19/2018 1231   RDW 14.1 04/06/2018 0838   LYMPHSABS 0.7 04/19/2018 1231   LYMPHSABS 0.9 04/06/2018 0838   MONOABS 0.3 04/19/2018 1231   EOSABS 0.1 04/19/2018 1231   EOSABS 0.1 04/06/2018 0838   BASOSABS 0.0 04/19/2018 1231   BASOSABS 0.0 04/06/2018 0838      Review of Systems Past Medical History:  Diagnosis Date  . CAD (coronary artery disease) 2003   Last catheterization 2008. Two-vessel PCI of the first OM branch and mid LAD.  Marland Kitchen CKD (chronic kidney disease)   . Congestive heart failure ([place]) 02/2017   grade 2 diastolic dysfunction  . CVA (cerebral vascular accident) ([place]) [place]   . Dementia   . DJD (degenerative joint disease) of lumbar spine   . Hypercholesteremia   . Hypertension   . IDDM (insulin dependent diabetes mellitus) ([place])     Past Surgical History:  Procedure Laterality  Date  . APPENDECTOMY    . KNEE SURGERY      Allergies  Allergen Reactions  . Propoxyphene N-Acetaminophen     Pt does not know reaction  . Simvastatin Other (See Comments)    headache    Current Outpatient Medications on File Prior to Visit  Medication Sig Dispense Refill  . aspirin EC 81 MG EC tablet Take 1 tablet (81 mg total) by mouth daily. 30 tablet 11  . carvedilol (COREG) 12.5 MG tablet Take 1 tablet (12.5 mg total) by mouth 2 (two) times daily with a meal. 60 tablet 3  . cholecalciferol (VITAMIN D) 1000 units tablet Take 1,000 Units by mouth daily.    . cloNIDine (CATAPRES) 0.2 MG tablet Take 0.2-0.4 mg by mouth See admin instructions. Give two tablets in the morning and one tablet in the evening    . clopidogrel (PLAVIX) 75 MG tablet TAKE 1 TABLET DAILY 30 tablet 2  . diclofenac sodium (VOLTAREN) 1 % GEL Apply 4 g topically 4 (four) times daily. (Patient taking differently: Apply 4 g topically 4 (four) times daily as needed (FOR PAIN). ) 100 g 2  . glucose blood (ACCU-CHEK AVIVA PLUS) test strip CHECK BLOOD SUGER UP TO 3 TIMES A DAY 300 each 3  . hydrALAZINE (  APRESOLINE) 50 MG tablet Take 1 tablet (50 mg total) by mouth every 8 (eight) hours. 90 tablet 0  . HYDROcodone-acetaminophen (NORCO) 5-325 MG tablet Take 0.5-1 tablets every 6 (six) hours as needed by mouth for moderate pain. 30 tablet 0  . Insulin Glargine (BASAGLAR KWIKPEN) 100 UNIT/ML SOPN Inject 0.1 mLs (10 Units total) into the skin at bedtime. 3 pen 3  . insulin NPH Human (NOVOLIN N) 100 UNIT/ML injection Inject 0.1 mLs (10 Units total) into the skin 2 (two) times daily before a meal. 10 mL 5  . isosorbide mononitrate (IMDUR) 120 MG 24 hr tablet Take 1 tablet (120 mg total) by mouth daily. 30 tablet 0  . levothyroxine (SYNTHROID, LEVOTHROID) 50 MCG tablet TAKE 1 TABLET DAILY 90 tablet 3  . nitroGLYCERIN (NITROSTAT) 0.4 MG SL tablet PLACE 1 TABLET UNDER THE TONGUE AT ONSET OF CHEST PAIN EVERY 5 MINTUES UP TO 3 TIMES AS  NEEDED 25 tablet 1  . pantoprazole (PROTONIX) 40 MG tablet TAKE 1 TABLET DAILY 30 tablet 5  . rosuvastatin (CRESTOR) 5 MG tablet Take 1 tablet (5 mg total) by mouth daily. 90 tablet 1  . torsemide (DEMADEX) 20 MG tablet Take 1 tablet (20 mg total) by mouth 2 (two) times daily. 60 tablet 5   No current facility-administered medications on file prior to visit.         Objective:   Physical Exam There were no vitals taken for this visit. Alert and oriented. Skin warm and dry. Oral mucosa is moist.   . Sclera anicteric, conjunctivae is pink. Thyroid not enlarged. No cervical lymphadenopathy. Lungs clear. Heart regular rate and rhythm.  Abdomen is soft. Bowel sounds are positive. No hepatomegaly. No abdominal masses felt. No tenderness.  No edema to lower extremities.  Walks with a walker.  Extremely weak.  Stool brown and guaiac negative. .         Assessment & Plan:  Normocytic anemia. Patient and daughter are not interested in a colonoscopy or EGD. Will send 3 stool cards home with patient.  Further recommendations to follow.

## 2018-04-26 NOTE — Patient Instructions (Signed)
[facility] at [facility] Discharge Instructions  You were seen by Dr. Higgs today. Follow-up as scheduled   Thank you for choosing [facility] at [facility] to provide your oncology and hematology care.  To afford each patient quality time with our provider, please arrive at least 15 minutes before your scheduled appointment time.   If you have a lab appointment with the Cancer Center please come in thru the  Main Entrance and check in at the main information desk  You need to re-schedule your appointment should you arrive 10 or more minutes late.  We strive to give you quality time with our providers, and arriving late affects you and other patients whose appointments are after yours.  Also, if you no show three or more times for appointments you may be dismissed from the clinic at the providers discretion.     Again, thank you for choosing [facility].  Our hope is that these requests will decrease the amount of time that you wait before being seen by our physicians.       _____________________________________________________________  Should you have questions after your visit to  Cancer Center, please contact our office at [phone] between the hours of 8:30 a.m. and 4:30 p.m.  Voicemails left after 4:30 p.m. will not be returned until the following business day.  For prescription refill requests, have your pharmacy contact our office.       Resources For Cancer Patients and their Caregivers ? American Cancer Society: Can assist with transportation, wigs, general needs, runs Look Good Feel Better.        1-888-227-6333 ? Cancer Care: Provides financial assistance, online support groups, medication/co-pay assistance.  1-800-813-HOPE (4673) ? [facility] Assists [place] cancer patients and their families through emotional , educational and financial support.  336-427-4357 ? [place] DSS Where to apply for food stamps, Medicaid and utility assistance. 336-342-1394 ? [facility]: Transportation to medical appointments. 336-347-2287 ? Social Security Administration: May apply for disability if have a Stage IV cancer. 336-342-7796 1-800-772-1213 ? [place] Aging, Disability and Transit Services: Assists with nutrition, care and transit needs. 336-349-2343  Cancer Center Support Programs:   > Cancer Support Group  2nd Tuesday of the month 1pm-2pm, Journey Room   > Creative Journey  3rd Tuesday of the month 1130am-1pm, Journey Room    

## 2018-04-26 NOTE — Progress Notes (Signed)
Diagnosis Normocytic anemia - Plan: CBC with Differential/Platelet, Comprehensive metabolic panel, Lactate dehydrogenase, Ferritin  Staging Cancer Staging No matching staging information was found for the patient.  Assessment and Plan:  1.  Normocytic anemia.  84 year old female who was referred by Dr. [name]  for anemia.  She had labs done on 03/29/2018 that showed a white count 3.5 hemoglobin 7.9 platelets 146,000.  Chemistries at that time were within normal limits other than a creatinine of 4.  She was recently transfused on 04/03/2018.  Labs were repeated on 04/06/2018 and showed a white count 3.6 hemoglobin 11.5 MCV 87 platelets 162,000.  Had a normal differential.  She denies any blood in her stool or her urine.  She reports occasional problems swallowing solids and liquids.  She has problems with bruising easily and some neuropathy.  She denies any smoking history.  There is a reported family history of pancreatic cancer.    Previously, I discussed with them patient was noted to be severely anemic on labs done Mar 29, 2018 when she had a hemoglobin of 7.9.  She was transfused on 04/03/2018 and hemoglobin is improved at 11.5 on labs done Apr 06, 2018.  Reports occasional bruising.  She also reports trouble swallowing.  Review of records indicate she had a CT of the chest, abdomen and pelvis without contrast done 03/04/2017 that showed IMPRESSION: 1. Walls of the esophagus appear somewhat thickened throughout suggesting a diffuse esophagitis of infectious or inflammatory nature. 2. Mild atelectasis/scarring at each lung base. No evidence of pneumonia. 3. Diffuse coronary artery calcifications. Recommend correlation with any possible associated cardiac symptoms. 4. Aortic atherosclerosis. 5. Colonic diverticulosis without evidence of acute diverticulitis. No bowel obstruction. 6. Uterine fibroid measuring approximately 4 cm.  Due to the significant anemia on Mar 29, 2018 when her hemoglobin  was 7.9 she was recommended for GI and GYN evaluation.  This is especially due to the thickening noted in the esophagus on that CT as well as the uterine fibroid that was reported on prior imaging.  Hemoglobin  improved at 11.5 on labs done Apr 06, 2018.    She is here to go over labs that were done 04/19/2018 and I reviewed with the pt that her WBC was 3, HB 9.3, plts 146,000.  Haptoglobin and SPEP were normal.  Ferritin was 463 and B12 and folate were normal.  Cr 3.99.  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  She was previously on procrit through nephrology and pt and daughter were given the option of procrit today.  They left without getting medication and should follow-up with nephrology for ongoing ESA therapy.  She will have repeat labs in 05/2018 and follow-up.   2.  Esophageal thickening.  This was reported on imaging that was done in April 2018.  She was referred to GI for evaluation especially in light of the severe anemia on labs that were done Mar 29, 2018.  Follow-up with GI as recommended.    3.  Uterine fibroid.  This was reported on imaging done April 2018.  She was referred to GYN for evaluation.  4.  Renal insufficiency.  Patient has a creatinine of 3.99 on labs done 04/19/2018, K= is 6.  SPEP negative.   She has chronic kidney disease and should follow-up with nephrology as recommended.  Pt has anemia due to CKD.    5.  Hypertension.  Blood pressure is  Elevated at 220/63.  Daughter reports pt hasn't taken BP meds  in 2 days.  Follow-up with PCP, due to significant elevation in BP or go to ER.    6.  Leukopenia.  WBC 3,000.  Pt afebrile and has a normal differential  Will repeat labs in 05/2018 for follow-up.    Interval history:  84 year old female who was referred by Dr. [name] for anemia.  She had labs done on 03/29/2018 that showed a white count 3.5 hemoglobin 7.9 platelets 146,000.  Chemistries at that time were within normal limits other than a creatinine of 4.   She was recently transfused on 04/03/2018.  Labs were repeated on 04/06/2018 and showed a white count 3.6 hemoglobin 11.5 MCV 87 platelets 162,000.  Had a normal differential.  She denies any blood in her stool or her urine.  She reports occasional problems swallowing solids and liquids.  She has problems with bruising easily and some neuropathy.  She denies any smoking history.  There is a reported family history of pancreatic cancer.  Current Status:  Patient is seen today for follow-up due to anemia.  She is accompanied by her daughter and are here to go over labs.    Problem List Patient Active Problem List   Diagnosis Date Noted  . Acute anemia [D64.9] 03/05/2018  . NSTEMI (non-ST elevated myocardial infarction) ([place]) [I21.4] 12/22/2017  . Acute encephalopathy [G93.40] 10/15/2017  . Acute renal failure superimposed on stage 4 chronic kidney disease ([place]) [N17.9, N18.4] 09/22/2017  . CAD (coronary artery disease), native coronary artery [I25.10] 09/21/2017  . Pleural effusion, bilateral [J90]   . Thrombocytopenia ([place]) [D69.6] 09/20/2017  . Abnormal CT scan of lung [R91.8]   . Chest pain [R07.9] 03/04/2017  . Esophagitis [K20.9]   . Occult blood in stools [R19.5] 11/29/2016  . UTI (urinary tract infection) [N39.0] 11/29/2016  . Urinary retention [R33.9] 11/29/2016  . PAF (paroxysmal atrial fibrillation) ([place]) [I48.0]   . Pressure injury of skin [L89.90] 11/22/2016  . Aortic valve sclerosis-2D June 2016 [I35.8] 10/05/2016  . Chest pain with high risk of acute coronary syndrome [R07.9] 10/05/2016  . 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 Pulmonary hypertension ([place]) [I27.20]   . Insulin dependent diabetes mellitus with complications (HCC) [N82.9, Z79.4]   . Chronic kidney disease, stage IV (severe) ([place]) [N18.4]   . Other constipation [K59.09]   . Failure to thrive in adult [R62.7]   . Hypomagnesemia [E83.42]   . CAD S/P PCI '03 and '08 [I25.10, Z98.61] 05/11/2015  . Anemia  in chronic kidney disease [N18.9, D63.1] 05/11/2015  . Benign paroxysmal positional vertigo [H81.10] 03/21/2014  . HTN (hypertension) [I10] 08/09/2013  . Lumbar spondylosis [M47.816] 05/16/2013  . Overweight [E66.3] 05/15/2013  . Varicose veins of lower extremities with other complications [F62.130] 86/57/8469  . Normocytic anemia [D64.9] 10/29/2012  . Hypoglycemia [E16.2] 10/29/2012  . NSVT (nonsustained ventricular tachycardia) ([place]) [I47.2] 10/28/2012  . OA (osteoarthritis) [M19.90] 10/27/2012  . Cerebrovascular disease [I67.9] 03/09/2010  . Hyperlipidemia [E78.5] 12/05/2008    Past Medical History Past Medical History:  Diagnosis Date  . CAD (coronary artery disease) 2003   Last catheterization 2008. Two-vessel PCI of the first OM branch and mid LAD.  Marland Kitchen CKD (chronic kidney disease)   . Congestive heart failure ([place]) 02/2017   grade 2 diastolic dysfunction  . CVA (cerebral vascular accident) ([place]) [place]   . Dementia   . DJD (degenerative joint disease) of lumbar spine   . Hypercholesteremia   . Hypertension   . IDDM (insulin dependent diabetes mellitus) ([place])  Past Surgical History Past Surgical History:  Procedure Laterality Date  . APPENDECTOMY    . KNEE SURGERY      Family History Family History  Problem Relation Age of Onset  . Heart attack Mother 80  . Heart attack Father 90  . Diabetes Brother        RETINOPATHY   . Drug abuse Brother   . Liver cancer Brother   . Breast cancer Daughter   . Diabetes Sister      Social History  reports that she has never smoked. She has never used smokeless tobacco. She reports that she does not drink alcohol or use drugs.  Medications  Current Outpatient Medications:  .  aspirin EC 81 MG EC tablet, Take 1 tablet (81 mg total) by mouth daily., Disp: 30 tablet, Rfl: 11 .  carvedilol (COREG) 12.5 MG tablet, Take 1 tablet (12.5 mg total) by mouth 2 (two) times daily with a meal., Disp: 60 tablet, Rfl: 3 .   cholecalciferol (VITAMIN D) 1000 units tablet, Take 1,000 Units by mouth daily., Disp: , Rfl:  .  cloNIDine (CATAPRES) 0.2 MG tablet, Take 0.2-0.4 mg by mouth See admin instructions. Give two tablets in the morning and one tablet in the evening, Disp: , Rfl:  .  clopidogrel (PLAVIX) 75 MG tablet, TAKE 1 TABLET DAILY, Disp: 30 tablet, Rfl: 2 .  diclofenac sodium (VOLTAREN) 1 % GEL, Apply 4 g topically 4 (four) times daily. (Patient taking differently: Apply 4 g topically 4 (four) times daily as needed (FOR PAIN). ), Disp: 100 g, Rfl: 2 .  glucose blood (ACCU-CHEK AVIVA PLUS) test strip, CHECK BLOOD SUGER UP TO 3 TIMES A DAY, Disp: 300 each, Rfl: 3 .  hydrALAZINE (APRESOLINE) 50 MG tablet, Take 1 tablet (50 mg total) by mouth every 8 (eight) hours., Disp: 90 tablet, Rfl: 0 .  HYDROcodone-acetaminophen (NORCO) 5-325 MG tablet, Take 0.5-1 tablets every 6 (six) hours as needed by mouth for moderate pain., Disp: 30 tablet, Rfl: 0 .  Insulin Glargine (BASAGLAR KWIKPEN) 100 UNIT/ML SOPN, Inject 0.1 mLs (10 Units total) into the skin at bedtime. (Patient not taking: Reported on 04/26/2018), Disp: 3 pen, Rfl: 3 .  insulin NPH Human (NOVOLIN N) 100 UNIT/ML injection, Inject 0.1 mLs (10 Units total) into the skin 2 (two) times daily before a meal., Disp: 10 mL, Rfl: 5 .  isosorbide mononitrate (IMDUR) 120 MG 24 hr tablet, Take 1 tablet (120 mg total) by mouth daily., Disp: 30 tablet, Rfl: 0 .  levothyroxine (SYNTHROID, LEVOTHROID) 50 MCG tablet, TAKE 1 TABLET DAILY, Disp: 90 tablet, Rfl: 3 .  nitroGLYCERIN (NITROSTAT) 0.4 MG SL tablet, PLACE 1 TABLET UNDER THE TONGUE AT ONSET OF CHEST PAIN EVERY 5 MINTUES UP TO 3 TIMES AS NEEDED, Disp: 25 tablet, Rfl: 1 .  pantoprazole (PROTONIX) 40 MG tablet, TAKE 1 TABLET DAILY, Disp: 30 tablet, Rfl: 5 .  rosuvastatin (CRESTOR) 5 MG tablet, Take 1 tablet (5 mg total) by mouth daily., Disp: 90 tablet, Rfl: 1 .  torsemide (DEMADEX) 20 MG tablet, Take 1 tablet (20 mg total) by mouth  2 (two) times daily., Disp: 60 tablet, Rfl: 5  Current Facility-Administered Medications:  .  epoetin alfa (EPOGEN,PROCRIT) injection 40,000 Units, 40,000 Units, Subcutaneous, Once, Lauraann Missey, MD  Allergies Propoxyphene n-acetaminophen and Simvastatin  Review of Systems Review of Systems - Oncology ROS as per HPI otherwise 12 point ROS is negative.   Physical Exam  Vitals Wt Readings from Last 3 Encounters:  04/26/18 121 lb (54.9 kg)  04/02/18 138 lb 6.4 oz (62.8 kg)  03/29/18 140 lb 12.8 oz (63.9 kg)   Temp Readings from Last 3 Encounters:  04/26/18 98.1 F (36.7 C) (Oral)  04/26/18 98 F (36.7 C)  04/13/18 98 F (36.7 C) (Oral)   BP Readings from Last 3 Encounters:  04/26/18 (!) 220/63  04/26/18 (!) 180/90  04/13/18 (!) 177/44   Pulse Readings from Last 3 Encounters:  04/26/18 72  04/26/18 68  04/13/18 (!) 57   Constitutional: Chronically ill appearing female in NAD.   HENT: Head: Normocephalic and atraumatic.  Mouth/Throat: No oropharyngeal exudate. Mucosa moist. Eyes: Pupils are equal, round, and reactive to light. Conjunctivae are normal. No scleral icterus.  Neck: Normal range of motion. Neck supple. No JVD present.  Cardiovascular: Normal rate, regular rhythm and normal heart sounds.  Exam reveals no gallop and no friction rub.   No murmur heard. Pulmonary/Chest: Effort normal and breath sounds normal. No respiratory distress. No wheezes.No rales.  Abdominal: Soft. Bowel sounds are normal. No distension. There is no tenderness. There is no guarding.  Musculoskeletal: No edema or tenderness.  Lymphadenopathy: No cervical, axillary or supraclavicular adenopathy.  Neurological: Alert and oriented to person, place, and time. No cranial nerve deficit.  Skin: Skin is warm and dry. No rash noted. No erythema. No pallor.  Psychiatric: Affect and judgment normal.   Labs No visits with results within 3 Day(s) from this visit.  Latest known visit with results  is:  Appointment on 04/19/2018  Component Date Value Ref Range Status  . WBC 04/19/2018 3.0* 4.0 - 10.5 K/uL Final  . RBC 04/19/2018 3.21* 3.87 - 5.11 MIL/uL Final  . Hemoglobin 04/19/2018 9.3* 12.0 - 15.0 g/dL Final  . HCT 04/19/2018 29.2* 36.0 - 46.0 % Final  . MCV 04/19/2018 91.0  78.0 - 100.0 fL Final  . MCH 04/19/2018 29.0  26.0 - 34.0 pg Final  . MCHC 04/19/2018 31.8  30.0 - 36.0 g/dL Final  . RDW 04/19/2018 13.5  11.5 - 15.5 % Final  . Platelets 04/19/2018 146* 150 - 400 K/uL Final  . Neutrophils Relative % 04/19/2018 62  % Final  . Neutro Abs 04/19/2018 1.9  1.7 - 7.7 K/uL Final  . Lymphocytes Relative 04/19/2018 24  % Final  . Lymphs Abs 04/19/2018 0.7  0.7 - 4.0 K/uL Final  . Monocytes Relative 04/19/2018 10  % Final  . Monocytes Absolute 04/19/2018 0.3  0.1 - 1.0 K/uL Final  . Eosinophils Relative 04/19/2018 4  % Final  . Eosinophils Absolute 04/19/2018 0.1  0.0 - 0.7 K/uL Final  . Basophils Relative 04/19/2018 0  % Final  . Basophils Absolute 04/19/2018 0.0  0.0 - 0.1 K/uL Final   Performed at [facility], [address] [place], [place] [address]  . Sodium 04/19/2018 135  135 - 145 mmol/L Final  . Potassium 04/19/2018 6.0* 3.5 - 5.1 mmol/L Final  . Chloride 04/19/2018 105  101 - 111 mmol/L Final  . CO2 04/19/2018 23  22 - 32 mmol/L Final  . Glucose, Bld 04/19/2018 212* 65 - 99 mg/dL Final  . BUN 04/19/2018 72* 6 - 20 mg/dL Final  . Creatinine, Ser 04/19/2018 3.99* 0.44 - 1.00 mg/dL Final  . Calcium 04/19/2018 9.3  8.9 - 10.3 mg/dL Final  . Total Protein 04/19/2018 6.1* 6.5 - 8.1 g/dL Final  . Albumin 04/19/2018 3.3* 3.5 - 5.0 g/dL Final  . AST 04/19/2018 11* 15 - 41 U/L Final  .  ALT 04/19/2018 11* 14 - 54 U/L Final  . Alkaline Phosphatase 04/19/2018 40  38 - 126 U/L Final  . Total Bilirubin 04/19/2018 0.5  0.3 - 1.2 mg/dL Final  . GFR calc non Af Amer 04/19/2018 10* >60 mL/min Final  . GFR calc Af Amer 04/19/2018 11* >60 mL/min Final   Comment: (NOTE) The eGFR  has been calculated using the CKD EPI equation. This calculation has not been validated in all clinical situations. eGFR's persistently <60 mL/min signify possible Chronic Kidney Disease.   Georgiann Hahn gap 04/19/2018 7  5 - 15 Final   Performed at [facility], [address] [place], [place] [address]  . LDH 04/19/2018 153  98 - 192 U/L Final   Performed at [facility], [address], [place], [place] [address]  . Ferritin 04/19/2018 463* 11 - 307 ng/mL Final   Performed at [facility], [place] [address], [place], [place] [address]  . Total Protein ELP 04/19/2018 5.6* 6.0 - 8.5 g/dL Final  . Albumin ELP 04/19/2018 3.3  2.9 - 4.4 g/dL Final  . Alpha-1-Globulin 04/19/2018 0.2  0.0 - 0.4 g/dL Final  . Alpha-2-Globulin 04/19/2018 0.7  0.4 - 1.0 g/dL Final  . Beta Globulin 04/19/2018 0.6* 0.7 - 1.3 g/dL Final  . Gamma Globulin 04/19/2018 0.7  0.4 - 1.8 g/dL Final  . M-Spike, % 04/19/2018 Not Observed  Not Observed g/dL Final  . SPE Interp. 04/19/2018 Comment   Final   Comment: (NOTE) The SPE pattern appears essentially unremarkable. Evidence of monoclonal protein is not apparent. Performed At: [facility] [place], [place] [id number] Rush Farmer MD [id number]   . Comment 04/19/2018 Comment   Final   Comment: (NOTE) Protein electrophoresis scan will follow via computer, mail, or courier delivery.   Marland Kitchen GLOBULIN, TOTAL 04/19/2018 2.3  2.2 - 3.9 g/dL Corrected  . A/G Ratio 04/19/2018 1.4  0.7 - 1.7 Corrected   Performed at [facility], [address], [place], [place] [address]  . Vitamin B-12 04/19/2018 274  180 - 914 pg/mL Final   Comment: (NOTE) This assay is not validated for testing neonatal or myeloproliferative syndrome specimens for Vitamin B12 levels. Performed at [facility], [place] [address], [place], [place] [address]   . Folate 04/19/2018 12.2  >5.9 ng/mL Final   Performed at [facility], [place] [address] [place], [place]  [address]  . Haptoglobin 04/19/2018 156  34 - 200 mg/dL Final   Comment: (NOTE) Performed At: [facility] [place], [place] [id number] Rush Farmer MD [id number] Performed at [facility], [address] [place], [place] [address]      Pathology Orders Placed This Encounter  Procedures  . CBC with Differential/Platelet    Standing Status:   Future    Standing Expiration Date:   04/27/2019  . Comprehensive metabolic panel    Standing Status:   Future    Standing Expiration Date:   04/27/2019  . Lactate dehydrogenase    Standing Status:   Future    Standing Expiration Date:   04/27/2019  . Ferritin    Standing Status:   Future    Standing Expiration Date:   04/27/2019       Zoila Shutter MD

## 2018-04-26 NOTE — Progress Notes (Signed)
Alert and oriented. Skin warm and dry. Oral mucosa is moist.   . Sclera anicteric, conjunctivae is pink. Thyroid not enlarged. No cervical lymphadenopathy. Lungs clear. Heart regular rate and rhythm.  Abdomen is soft. Bowel sounds are positive. No hepatomegaly. No abdominal masses felt. No tenderness.  No edema to lower extremities. Patient is alert and oriented.Patient ID: [name], female   DOB: 13-Feb-1932, 84 y.o.   MRN: [id number]

## 2018-04-26 NOTE — Patient Instructions (Signed)
Iron studies today.

## 2018-05-01 ENCOUNTER — Other Ambulatory Visit: Payer: Self-pay | Admitting: Family Medicine

## 2018-05-07 ENCOUNTER — Encounter: Payer: Medicare Other | Admitting: Adult Health

## 2018-05-08 ENCOUNTER — Telehealth (HOSPITAL_COMMUNITY): Payer: Self-pay | Admitting: Internal Medicine

## 2018-05-08 ENCOUNTER — Other Ambulatory Visit: Payer: Self-pay | Admitting: *Deleted

## 2018-05-08 ENCOUNTER — Encounter: Payer: Self-pay | Admitting: *Deleted

## 2018-05-08 ENCOUNTER — Encounter (HOSPITAL_COMMUNITY): Payer: Self-pay | Admitting: Internal Medicine

## 2018-05-08 NOTE — Patient Outreach (Signed)
[facility]) Care Management   05/08/2018  [name] 08-22-1932 [id number]  [name] is an 84 y.o. female  Subjective: Routine home visit with pt, HIPAA verified, patient's daughter not present, pt has visitor in the home.  Pt states no medication changes, pt unsure if received advanced directive documents citing " my daughter would know" and pt not sure if supportive care program contacted her.  Pt states " blood sugar has been better, mostly around 100"  Pt states she is still not able to weigh due to unsteadiness.  Pt states "had a small place on my bottom and it's healed up now"  Objective:   Vitals:   05/08/18 1217  BP: (!) 118/58  Pulse: (!) 59  Resp: 18  SpO2: 97%  CBG 88 today  ROS  Physical Exam  Constitutional: She is oriented to person, place, and time. She appears well-developed and well-nourished.  HENT:  Head: Normocephalic.  Neck: Normal range of motion. Neck supple.  Respiratory: Effort normal and breath sounds normal.  GI: Soft. Bowel sounds are normal.  Musculoskeletal: Normal range of motion. She exhibits edema.  2-3+ edema RLE 1+ edema LLE  Neurological: She is alert and oriented to person, place, and time.  Skin: Skin is warm and dry.  Psychiatric: She has a normal mood and affect. Her behavior is normal. Thought content normal.    Encounter Medications:   Outpatient Encounter Medications as of 05/08/2018  Medication Sig Note  . aspirin EC 81 MG EC tablet Take 1 tablet (81 mg total) by mouth daily.   . carvedilol (COREG) 12.5 MG tablet TAKE  (1)  TABLET TWICE A DAY.   . cholecalciferol (VITAMIN D) 1000 units tablet Take 1,000 Units by mouth daily.   . cloNIDine (CATAPRES) 0.2 MG tablet Take 0.2-0.4 mg by mouth See admin instructions. Give two tablets in the morning and one tablet in the evening   . clopidogrel (PLAVIX) 75 MG tablet TAKE 1 TABLET DAILY   . diclofenac sodium (VOLTAREN) 1 % GEL Apply 4 g topically 4 (four)  times daily. (Patient taking differently: Apply 4 g topically 4 (four) times daily as needed (FOR PAIN). ) 11/24/2017: Patient still has if needed but has not taken recently  . glucose blood (ACCU-CHEK AVIVA PLUS) test strip CHECK BLOOD SUGER UP TO 3 TIMES A DAY   . hydrALAZINE (APRESOLINE) 50 MG tablet Take 1 tablet (50 mg total) by mouth every 8 (eight) hours.   . insulin NPH Human (NOVOLIN N) 100 UNIT/ML injection Inject 0.1 mLs (10 Units total) into the skin 2 (two) times daily before a meal. 03/05/2018: If blood sugars are less than 100 in the mornings, insulin is not administered. Per daughter, she sometimes exceeds 10 units for evening doses due to elevated blood sugar levels  . isosorbide mononitrate (IMDUR) 120 MG 24 hr tablet Take 1 tablet (120 mg total) by mouth daily.   Marland Kitchen levothyroxine (SYNTHROID, LEVOTHROID) 50 MCG tablet TAKE 1 TABLET DAILY   . nitroGLYCERIN (NITROSTAT) 0.4 MG SL tablet PLACE 1 TABLET UNDER THE TONGUE AT ONSET OF CHEST PAIN EVERY 5 MINTUES UP TO 3 TIMES AS NEEDED   . pantoprazole (PROTONIX) 40 MG tablet TAKE 1 TABLET DAILY   . rosuvastatin (CRESTOR) 5 MG tablet Take 1 tablet (5 mg total) by mouth daily.   Marland Kitchen torsemide (DEMADEX) 20 MG tablet Take 1 tablet (20 mg total) by mouth 2 (two) times daily.   Marland Kitchen HYDROcodone-acetaminophen (NORCO) 5-325  MG tablet Take 0.5-1 tablets every 6 (six) hours as needed by mouth for moderate pain. (Patient not taking: Reported on 05/08/2018)   . Insulin Glargine (BASAGLAR KWIKPEN) 100 UNIT/ML SOPN Inject 0.1 mLs (10 Units total) into the skin at bedtime. (Patient not taking: Reported on 04/26/2018)    No facility-administered encounter medications on file as of 05/08/2018.     Functional Status:   In your present state of health, do you have any difficulty performing the following activities: 03/15/2018 03/05/2018  Hearing? N N  Vision? N N  Difficulty concentrating or making decisions? N N  Walking or climbing stairs? Y Y  Dressing or bathing?  Y Y  Doing errands, shopping? Tempie Donning  Preparing Food and eating ? Y -  Using the Toilet? Y -  In the past six months, have you accidently leaked urine? Y -  Do you have problems with loss of bowel control? N -  Managing your Medications? Y -  Managing your Finances? Y -  Housekeeping or managing your Housekeeping? Y -  Some recent data might be hidden    Fall/Depression Screening:    Fall Risk  03/29/2018 03/15/2018 03/13/2018  Falls in the past year? Yes Yes No  Comment - - -  Number falls in past yr: 2 or more 2 or more -  Injury with Fall? No No -  Comment - - -  Risk Factor Category  High Fall Risk High Fall Risk -  Risk for fall due to : - Impaired balance/gait;History of fall(s);Impaired mobility -  Risk for fall due to: Comment - - -  Follow up - Falls evaluation completed;Education provided -   [facility] 2/9 Scores 03/29/2018 03/15/2018 03/13/2018 03/09/2018 01/30/2018 01/02/2018 12/01/2017  PHQ - 2 Score 0 0 0 0 0 0 3  PHQ- 9 Score - - - - - - 8    Assessment:  Pt continues to live with daughter [name], pt is by herself at times, has walker, RN CM reviewed safety precautions,  RN CM tried to call daughter [name] to inquire about advanced directives and supportive care program, no answer to telephone, received message voicemail not set up.  RN CM ask pt to have daughter call RN CM.    THN CM Care Plan Problem One     Most Recent Value  Care Plan Problem One  Difficulty managing chronic health conditions (DM, anemia, CKD)  Role Documenting the Problem One  Care Management Coordinator  Care Plan for Problem One  Active  THN Long Term Goal   Pt, daughter will verbalize/ demonstrate improved self care within 60 days  THN Long Term Goal Start Date  03/15/18  Interventions for Problem One Long Term Goal  Rn CM reviewed medications with pt, importance of taking as prescribed, pt still not able to weigh (safety issue per pt).  THN CM Short Term Goal #1   Pt will choose more nutritious food (pt eats  alot of french fries, potatoes), adhere to plate method within 30 days  THN CM Short Term Goal #1 Start Date  05/08/18 [name] re-established    needs reinforcement]  Interventions for Short Term Goal #1  RN CM reinforced plate method, ask pt to choose fresh and frozen fruits and vegetables    THN CM Care Plan Problem Two     Most Recent Value  Care Plan Problem Two  Pt non-adherent to plan of care related to CKD  Role Documenting the Problem Two  Care Management  Coordinator  Care Plan for Problem Two  Active  THN CM Short Term Goal #1   Pt will adhere to medication regime, will name 2 ways to preserve kidney function, will discuss with MD use of dietary supplements such as Salt substitute within 30 days  THN CM Short Term Goal #1 Start Date  03/15/18  [facility] CM Short Term Goal #1 Met Date   04/10/18    [facility] CM Care Plan Problem Three     Most Recent Value  Care Plan Problem Three  Pt high risk for skin breakdown  Role Documenting the Problem Three  Care Management Coordinator  Care Plan for Problem Three  Active  THN CM Short Term Goal #1   Pt will verbalize ways to prevent skin breakdown and preserve skin integrity within 30 days  THN CM Short Term Goal #1 Start Date  05/08/18  Interventions for Short Term Goal #1  Pt states she had " place on my butt that has now scabbed over",  RN CM observed buttocks, there is healed area, dry, flaky skin, no drainage,  RN CM ask pt to make sure protective ointment is applied, keep area clean and dry, ask pt to change positions q 2 hours and stand up with walker throughout the day.      Plan: see pt for home visit next month Assess CBG Follow up on advanced directives, supportive care program (talk with daughter) Assess skin  Jacqlyn Larsen [facility], [place] Coordinator [phone]

## 2018-05-15 NOTE — Telephone Encounter (Signed)
Erroneous note

## 2018-05-18 ENCOUNTER — Emergency Department (HOSPITAL_COMMUNITY): Payer: Medicare Other

## 2018-05-18 ENCOUNTER — Other Ambulatory Visit: Payer: Self-pay

## 2018-05-18 ENCOUNTER — Encounter (HOSPITAL_COMMUNITY): Payer: Self-pay | Admitting: *Deleted

## 2018-05-18 ENCOUNTER — Inpatient Hospital Stay (HOSPITAL_COMMUNITY)
Admission: EM | Admit: 2018-05-18 | Discharge: 2018-05-22 | DRG: 281 | Disposition: A | Discharge status: Home / Self Care | Payer: Medicare Other | Attending: Internal Medicine | Admitting: Internal Medicine

## 2018-05-18 DIAGNOSIS — N289 Disorder of kidney and ureter, unspecified: Secondary | ICD-10-CM | POA: Diagnosis not present

## 2018-05-18 DIAGNOSIS — Z7902 Long term (current) use of antithrombotics/antiplatelets: Secondary | ICD-10-CM

## 2018-05-18 DIAGNOSIS — I214 Non-ST elevation (NSTEMI) myocardial infarction: Principal | ICD-10-CM | POA: Diagnosis present

## 2018-05-18 DIAGNOSIS — I679 Cerebrovascular disease, unspecified: Secondary | ICD-10-CM | POA: Diagnosis not present

## 2018-05-18 DIAGNOSIS — E785 Hyperlipidemia, unspecified: Secondary | ICD-10-CM | POA: Diagnosis not present

## 2018-05-18 DIAGNOSIS — R072 Precordial pain: Secondary | ICD-10-CM | POA: Diagnosis not present

## 2018-05-18 DIAGNOSIS — K5730 Diverticulosis of large intestine without perforation or abscess without bleeding: Secondary | ICD-10-CM | POA: Diagnosis not present

## 2018-05-18 DIAGNOSIS — I5032 Chronic diastolic (congestive) heart failure: Secondary | ICD-10-CM | POA: Diagnosis present

## 2018-05-18 DIAGNOSIS — Z66 Do not resuscitate: Secondary | ICD-10-CM | POA: Diagnosis not present

## 2018-05-18 DIAGNOSIS — I2510 Atherosclerotic heart disease of native coronary artery without angina pectoris: Secondary | ICD-10-CM | POA: Diagnosis present

## 2018-05-18 DIAGNOSIS — Z8673 Personal history of transient ischemic attack (TIA), and cerebral infarction without residual deficits: Secondary | ICD-10-CM

## 2018-05-18 DIAGNOSIS — E875 Hyperkalemia: Secondary | ICD-10-CM | POA: Diagnosis present

## 2018-05-18 DIAGNOSIS — N184 Chronic kidney disease, stage 4 (severe): Secondary | ICD-10-CM | POA: Diagnosis not present

## 2018-05-18 DIAGNOSIS — Z885 Allergy status to narcotic agent status: Secondary | ICD-10-CM | POA: Diagnosis not present

## 2018-05-18 DIAGNOSIS — R079 Chest pain, unspecified: Secondary | ICD-10-CM | POA: Diagnosis not present

## 2018-05-18 DIAGNOSIS — E1122 Type 2 diabetes mellitus with diabetic chronic kidney disease: Secondary | ICD-10-CM | POA: Diagnosis present

## 2018-05-18 DIAGNOSIS — M47816 Spondylosis without myelopathy or radiculopathy, lumbar region: Secondary | ICD-10-CM | POA: Diagnosis present

## 2018-05-18 DIAGNOSIS — Z79890 Hormone replacement therapy (postmenopausal): Secondary | ICD-10-CM

## 2018-05-18 DIAGNOSIS — R627 Adult failure to thrive: Secondary | ICD-10-CM | POA: Diagnosis not present

## 2018-05-18 DIAGNOSIS — Z7189 Other specified counseling: Secondary | ICD-10-CM | POA: Diagnosis not present

## 2018-05-18 DIAGNOSIS — E039 Hypothyroidism, unspecified: Secondary | ICD-10-CM | POA: Diagnosis not present

## 2018-05-18 DIAGNOSIS — E11649 Type 2 diabetes mellitus with hypoglycemia without coma: Secondary | ICD-10-CM | POA: Diagnosis not present

## 2018-05-18 DIAGNOSIS — Z888 Allergy status to other drugs, medicaments and biological substances status: Secondary | ICD-10-CM | POA: Diagnosis not present

## 2018-05-18 DIAGNOSIS — I160 Hypertensive urgency: Secondary | ICD-10-CM | POA: Diagnosis not present

## 2018-05-18 DIAGNOSIS — D649 Anemia, unspecified: Secondary | ICD-10-CM

## 2018-05-18 DIAGNOSIS — I10 Essential (primary) hypertension: Secondary | ICD-10-CM | POA: Diagnosis present

## 2018-05-18 DIAGNOSIS — D509 Iron deficiency anemia, unspecified: Secondary | ICD-10-CM | POA: Diagnosis present

## 2018-05-18 DIAGNOSIS — Z79899 Other long term (current) drug therapy: Secondary | ICD-10-CM

## 2018-05-18 DIAGNOSIS — Z794 Long term (current) use of insulin: Secondary | ICD-10-CM | POA: Diagnosis not present

## 2018-05-18 DIAGNOSIS — R1084 Generalized abdominal pain: Secondary | ICD-10-CM

## 2018-05-18 DIAGNOSIS — Z7982 Long term (current) use of aspirin: Secondary | ICD-10-CM

## 2018-05-18 DIAGNOSIS — F0150 Vascular dementia without behavioral disturbance: Secondary | ICD-10-CM | POA: Diagnosis present

## 2018-05-18 DIAGNOSIS — Z515 Encounter for palliative care: Secondary | ICD-10-CM

## 2018-05-18 DIAGNOSIS — I440 Atrioventricular block, first degree: Secondary | ICD-10-CM | POA: Diagnosis not present

## 2018-05-18 DIAGNOSIS — R0789 Other chest pain: Secondary | ICD-10-CM | POA: Diagnosis not present

## 2018-05-18 DIAGNOSIS — I130 Hypertensive heart and chronic kidney disease with heart failure and stage 1 through stage 4 chronic kidney disease, or unspecified chronic kidney disease: Secondary | ICD-10-CM | POA: Diagnosis present

## 2018-05-18 DIAGNOSIS — R0689 Other abnormalities of breathing: Secondary | ICD-10-CM | POA: Diagnosis not present

## 2018-05-18 DIAGNOSIS — I4891 Unspecified atrial fibrillation: Secondary | ICD-10-CM | POA: Diagnosis not present

## 2018-05-18 DIAGNOSIS — D508 Other iron deficiency anemias: Secondary | ICD-10-CM | POA: Diagnosis not present

## 2018-05-18 LAB — TROPONIN I: TROPONIN I: 0.05 ng/mL — AB (ref <0.03)

## 2018-05-18 LAB — COMPREHENSIVE METABOLIC PANEL
ALBUMIN: 3 g/dL — AB (ref 3.5–5.0)
ALT: 7 U/L — ABNORMAL LOW (ref 14–54)
ANION GAP: 8 (ref 5–15)
AST: 12 U/L — ABNORMAL LOW (ref 15–41)
Alkaline Phosphatase: 38 U/L (ref 38–126)
BUN: 75 mg/dL — ABNORMAL HIGH (ref 6–20)
CO2: 23 mmol/L (ref 22–32)
Calcium: 9 mg/dL (ref 8.9–10.3)
Chloride: 106 mmol/L (ref 101–111)
Creatinine, Ser: 3.88 mg/dL — ABNORMAL HIGH (ref 0.44–1.00)
GFR calc non Af Amer: 10 mL/min — ABNORMAL LOW (ref >60)
GFR, EST AFRICAN AMERICAN: 11 mL/min — AB (ref >60)
GLUCOSE: 96 mg/dL (ref 65–99)
Potassium: 5.4 mmol/L — ABNORMAL HIGH (ref 3.5–5.1)
Sodium: 137 mmol/L (ref 135–145)
Total Bilirubin: 0.6 mg/dL (ref 0.3–1.2)
Total Protein: 5.6 g/dL — ABNORMAL LOW (ref 6.5–8.1)

## 2018-05-18 LAB — CBC WITH DIFFERENTIAL/PLATELET
Basophils Absolute: 0 10*3/uL (ref 0.0–0.1)
Basophils Relative: 1 %
EOS ABS: 0.1 10*3/uL (ref 0.0–0.7)
Eosinophils Relative: 4 %
HCT: 23.7 % — ABNORMAL LOW (ref 36.0–46.0)
HEMOGLOBIN: 7.6 g/dL — AB (ref 12.0–15.0)
Lymphocytes Relative: 20 %
Lymphs Abs: 0.7 10*3/uL (ref 0.7–4.0)
MCH: 29.1 pg (ref 26.0–34.0)
MCHC: 32.1 g/dL (ref 30.0–36.0)
MCV: 90.8 fL (ref 78.0–100.0)
MONOS PCT: 7 %
Monocytes Absolute: 0.3 10*3/uL (ref 0.1–1.0)
NEUTROS PCT: 70 %
Neutro Abs: 2.6 10*3/uL (ref 1.7–7.7)
Platelets: 161 10*3/uL (ref 150–400)
RBC: 2.61 MIL/uL — ABNORMAL LOW (ref 3.87–5.11)
RDW: 14.2 % (ref 11.5–15.5)
WBC: 3.7 10*3/uL — ABNORMAL LOW (ref 4.0–10.5)

## 2018-05-18 LAB — LIPASE, BLOOD: Lipase: 31 U/L (ref 11–51)

## 2018-05-18 MED ORDER — ONDANSETRON HCL 4 MG/2ML IJ SOLN
4.0000 mg | Freq: Once | INTRAMUSCULAR | Status: AC
  Administered 2018-05-18: 4 mg via INTRAVENOUS
  Filled 2018-05-18: qty 2

## 2018-05-18 MED ORDER — SODIUM CHLORIDE 0.9 % IV SOLN
INTRAVENOUS | Status: DC
Start: 2018-05-18 — End: 2018-05-19

## 2018-05-18 NOTE — ED Provider Notes (Signed)
[facility] EMERGENCY DEPARTMENT Provider Note   CSN: [id number] Arrival date & time: 05/18/18  1747     History   Chief Complaint Chief Complaint  Patient presents with  . Chest Pain    HPI [name] is a 84 y.o. female.  Patient brought in by EMS.  Patient called EMS for chest pain beginning at 2 PM this afternoon.  Patient had 3 nitro at home before EMS arrival with no relief.  Patient also had aspirin.  Patient upon arrival here was complaining of substernal chest pain as well as generalized abdominal pain.  Patient is a DNR.  Patient has a history of dementia.  Known coronary artery disease.  Patient had a non-STEMI recently family refused cardiac cath.  Patient also known to have chronic kidney disease.  Patient family refused any additional work-up on that.  Patient's family is not here currently.  The admission for the non-STEMI was in January.  Patient also known to have anemia.  Has had blood transfusions before is followed by hematology oncology for this.  GI work-up for this without any significant findings.     Past Medical History:  Diagnosis Date  . CAD (coronary artery disease) 2003   Last catheterization 2008. Two-vessel PCI of the first OM branch and mid LAD.  Marland Kitchen CKD (chronic kidney disease)   . Congestive heart failure ([place]) 02/2017   grade 2 diastolic dysfunction  . CVA (cerebral vascular accident) ([place]) [place]   . Dementia   . DJD (degenerative joint disease) of lumbar spine   . Hypercholesteremia   . Hypertension   . IDDM (insulin dependent diabetes mellitus) [facility])     Patient Active Problem List   Diagnosis Date Noted  . Acute anemia 03/05/2018  . NSTEMI (non-ST elevated myocardial infarction) ([place]) 12/22/2017  . Acute encephalopathy 10/15/2017  . Acute renal failure superimposed on stage 4 chronic kidney disease ([place]) 09/22/2017  . CAD (coronary artery disease), native coronary artery 09/21/2017  . Pleural effusion, bilateral   .  Thrombocytopenia ([place]) 09/20/2017  . Abnormal CT scan of lung   . Chest pain 03/04/2017  . Esophagitis   . Occult blood in stools 11/29/2016  . UTI (urinary tract infection) 11/29/2016  . Urinary retention 11/29/2016  . PAF (paroxysmal atrial fibrillation) ([place])   . Pressure injury of skin 11/22/2016  . Aortic valve sclerosis-2D June 2016 10/05/2016  . Chest pain with high risk of acute coronary syndrome 10/05/2016  . 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 Pulmonary hypertension ([place])   . Insulin dependent diabetes mellitus with complications ([place])   . Chronic kidney disease, stage IV (severe) ([place])   . Other constipation   . Failure to thrive in adult   . Hypomagnesemia   . CAD S/P PCI '03 and '08 05/11/2015  . Anemia in chronic kidney disease 05/11/2015  . Benign paroxysmal positional vertigo 03/21/2014  . HTN (hypertension) 08/09/2013  . Lumbar spondylosis 05/16/2013  . Overweight 05/15/2013  . Varicose veins of lower extremities with other complications 74/10/8785  . Normocytic anemia 10/29/2012  . Hypoglycemia 10/29/2012  . NSVT (nonsustained ventricular tachycardia) ([place]) 10/28/2012  . OA (osteoarthritis) 10/27/2012  . Cerebrovascular disease 03/09/2010  . Hyperlipidemia 12/05/2008    Past Surgical History:  Procedure Laterality Date  . APPENDECTOMY    . KNEE SURGERY       OB History    Gravida      Para      Term  Preterm      AB      Living  5     SAB      TAB      Ectopic      Multiple      Live Births               Home Medications    Prior to Admission medications   Medication Sig Start Date End Date Taking? Authorizing Provider  aspirin EC 81 MG EC tablet Take 1 tablet (81 mg total) by mouth daily. 10/08/16   Bhagat, Crista Luria, PA  carvedilol (COREG) 12.5 MG tablet TAKE  (1)  TABLET TWICE A DAY. 05/01/18   Timmothy Euler, MD  cholecalciferol (VITAMIN D) 1000 units tablet Take 1,000 Units by mouth daily.    [provider]  cloNIDine (CATAPRES) 0.2 MG tablet Take 0.2-0.4 mg by mouth See admin instructions. Give two tablets in the morning and one tablet in the evening 01/30/18   [provider]  clopidogrel (PLAVIX) 75 MG tablet TAKE 1 TABLET DAILY 03/08/18   Timmothy Euler, MD  diclofenac sodium (VOLTAREN) 1 % GEL Apply 4 g topically 4 (four) times daily. Patient taking differently: Apply 4 g topically 4 (four) times daily as needed (FOR PAIN).  07/29/16   Timmothy Euler, MD  glucose blood (ACCU-CHEK AVIVA PLUS) test strip CHECK BLOOD SUGER UP TO 3 TIMES A DAY 01/02/18   Timmothy Euler, MD  hydrALAZINE (APRESOLINE) 50 MG tablet Take 1 tablet (50 mg total) by mouth every 8 (eight) hours. 04/26/18 05/26/18  Timmothy Euler, MD  HYDROcodone-acetaminophen (NORCO) 5-325 MG tablet Take 0.5-1 tablets every 6 (six) hours as needed by mouth for moderate pain. Patient not taking: Reported on 05/08/2018 10/09/17   Timmothy Euler, MD  Insulin Glargine [facility]) 100 UNIT/ML SOPN Inject 0.1 mLs (10 Units total) into the skin at bedtime. Patient not taking: Reported on 04/26/2018 03/13/18   Timmothy Euler, MD  insulin NPH Human (NOVOLIN N) 100 UNIT/ML injection Inject 0.1 mLs (10 Units total) into the skin 2 (two) times daily before a meal. 03/01/18   Timmothy Euler, MD  isosorbide mononitrate (IMDUR) 120 MG 24 hr tablet Take 1 tablet (120 mg total) by mouth daily. 11/27/17   Charlynne Cousins, MD  levothyroxine (SYNTHROID, LEVOTHROID) 50 MCG tablet TAKE 1 TABLET DAILY 04/03/17   Timmothy Euler, MD  nitroGLYCERIN (NITROSTAT) 0.4 MG SL tablet PLACE 1 TABLET UNDER THE TONGUE AT ONSET OF CHEST PAIN EVERY 5 MINTUES UP TO 3 TIMES AS NEEDED 05/01/18   Timmothy Euler, MD  pantoprazole (PROTONIX) 40 MG tablet TAKE 1 TABLET DAILY 01/15/18   Timmothy Euler, MD  rosuvastatin (CRESTOR) 5 MG tablet Take 1 tablet (5 mg total) by mouth daily. 03/13/18   Timmothy Euler, MD  torsemide  (DEMADEX) 20 MG tablet Take 1 tablet (20 mg total) by mouth 2 (two) times daily. 01/15/18   Timmothy Euler, MD    Family History Family History  Problem Relation Age of Onset  . Heart attack Mother 69  . Heart attack Father 21  . Diabetes Brother        RETINOPATHY   . Drug abuse Brother   . Liver cancer Brother   . Breast cancer Daughter   . Diabetes Sister     Social History Social History   Tobacco Use  . Smoking status: Never Smoker  . Smokeless tobacco: Never  Used  Substance Use Topics  . Alcohol use: No  . Drug use: No     Allergies   Propoxyphene n-acetaminophen and Simvastatin   Review of Systems Review of Systems  Unable to perform ROS: Dementia     Physical Exam Updated Vital Signs BP (!) 162/51   Pulse (!) 53   Temp (!) 97.5 F (36.4 C) (Oral)   Resp 15   SpO2 100%   Physical Exam  Constitutional: She appears well-developed and well-nourished. No distress.  HENT:  Head: Normocephalic and atraumatic.  Eyes: Pupils are equal, round, and reactive to light. EOM are normal.  Neck: Neck supple.  Cardiovascular: Normal rate and regular rhythm.  Bradycardic  Pulmonary/Chest: Effort normal and breath sounds normal. No respiratory distress. She exhibits no tenderness.  Abdominal: Soft. Bowel sounds are normal. There is tenderness.  Musculoskeletal: Normal range of motion. She exhibits no edema, tenderness or deformity.  Neurological: She is alert. She displays normal reflexes. No cranial nerve deficit or sensory deficit. She exhibits normal muscle tone.  Patient with some bilateral weakness to both lower extremities.  Skin: Skin is warm. No rash noted. There is pallor.  Nursing note and vitals reviewed.    ED Treatments / Results  Labs (all labs ordered are listed, but only abnormal results are displayed) Labs Reviewed  COMPREHENSIVE METABOLIC PANEL - Abnormal; Notable for the following components:      Result Value   Potassium 5.4 (*)     BUN 75 (*)    Creatinine, Ser 3.88 (*)    Total Protein 5.6 (*)    Albumin 3.0 (*)    AST 12 (*)    ALT 7 (*)    GFR calc non Af Amer 10 (*)    GFR calc Af Amer 11 (*)    All other components within normal limits  CBC WITH DIFFERENTIAL/PLATELET - Abnormal; Notable for the following components:   WBC 3.7 (*)    RBC 2.61 (*)    Hemoglobin 7.6 (*)    HCT 23.7 (*)    All other components within normal limits  TROPONIN I - Abnormal; Notable for the following components:   Troponin I 0.05 (*)    All other components within normal limits  LIPASE, BLOOD  TROPONIN I    EKG EKG Interpretation  Date/Time:  Friday May 18 2018 19:11:41 EDT Ventricular Rate:  51 PR Interval:    QRS Duration: 121 QT Interval:  492 QTC Calculation: 454 R Axis:   -13 Text Interpretation:  Sinus rhythm Prolonged PR interval Left ventricular hypertrophy Anterior Q waves, possibly due to LVH Confirmed by Fredia Sorrow [phone]) on 05/18/2018 7:15:33 PM   Radiology Dg Chest 2 View  Result Date: 05/18/2018 CLINICAL DATA:  84 year old female with chest pain since 1400 hours. EXAM: CHEST - 2 VIEW COMPARISON:  Chest radiographs 03/05/2018 and earlier. FINDINGS: Upright AP and lateral views of chest. Stable lung volumes. Possible small pleural effusions. Stable pulmonary vascularity, no acute edema. Stable cardiomegaly and mediastinal contours. Calcified aortic atherosclerosis. Visualized tracheal air column is within normal limits. No pneumothorax. No acute osseous abnormality identified. Negative visible bowel gas pattern. IMPRESSION: 1. Possible small pleural effusions, not significantly changed since April. No acute cardiopulmonary abnormality. 2. Chronic cardiomegaly, Aortic Atherosclerosis (ICD10-I70.0). Electronically Signed   By: Genevie Ann M.D.   On: 05/18/2018 20:07    Procedures Procedures (including critical care time)  Medications Ordered in ED Medications  0.9 %  sodium chloride infusion ( Intravenous  New Bag/Given 05/18/18 1947)  ondansetron (ZOFRAN) injection 4 mg (4 mg Intravenous Given 05/18/18 1947)     Initial Impression / Assessment and Plan / ED Course  I have reviewed the triage vital signs and the nursing notes.  Pertinent labs & imaging results that were available during my care of the patient were reviewed by me and considered in my medical decision making (see chart for details).    Patient with an elevated troponin here first one 0.05.  Chest x-ray negative CT abdomen pending.  Patient with anemia but not significantly abnormal hemoglobin was 7.6.  Repeat troponin is pending.  As mentioned earlier patient did have a non-STEMI in January.  They refused any cardiac catheter any intervention family did patient is a DNR.  She also has renal insufficiency but this is somewhat baseline.  Feel that patient probably will warrant chest pain rule out.  Is possible with his improvement in the second troponin she could be discharged back home.  Obviously if CT abdomen has any significant abnormalities that will require admission as well.  Also the question of whether patient would benefit from blood transfusion.  Final Clinical Impressions(s) / ED Diagnoses   Final diagnoses:  Precordial pain  Anemia, unspecified type  Generalized abdominal pain  Renal insufficiency    ED Discharge Orders    None       Fredia Sorrow, MD 05/18/18 2308

## 2018-05-18 NOTE — ED Triage Notes (Signed)
Patient daughter called EMS  For chest pain beginning today at 1400.  3 Nitro at home before EMS arrival with no relief.    Aspirin of unknown amount per daughter.

## 2018-05-18 NOTE — ED Notes (Signed)
Date and time results received: 05/18/18 8:16 PM   (use smartphrase ".now" to insert current time)  Test: Troponin  Critical Value: 0.5  Name of Provider Notified: Zackowski  Orders Received? Or Actions Taken?: none at this time

## 2018-05-19 ENCOUNTER — Inpatient Hospital Stay (HOSPITAL_COMMUNITY): Payer: Medicare Other

## 2018-05-19 ENCOUNTER — Other Ambulatory Visit: Payer: Self-pay

## 2018-05-19 ENCOUNTER — Encounter (HOSPITAL_COMMUNITY): Payer: Self-pay | Admitting: Emergency Medicine

## 2018-05-19 DIAGNOSIS — M47816 Spondylosis without myelopathy or radiculopathy, lumbar region: Secondary | ICD-10-CM | POA: Diagnosis present

## 2018-05-19 DIAGNOSIS — I5032 Chronic diastolic (congestive) heart failure: Secondary | ICD-10-CM | POA: Diagnosis present

## 2018-05-19 DIAGNOSIS — N184 Chronic kidney disease, stage 4 (severe): Secondary | ICD-10-CM | POA: Diagnosis not present

## 2018-05-19 DIAGNOSIS — E039 Hypothyroidism, unspecified: Secondary | ICD-10-CM | POA: Diagnosis present

## 2018-05-19 DIAGNOSIS — Z79899 Other long term (current) drug therapy: Secondary | ICD-10-CM | POA: Diagnosis not present

## 2018-05-19 DIAGNOSIS — Z66 Do not resuscitate: Secondary | ICD-10-CM | POA: Diagnosis present

## 2018-05-19 DIAGNOSIS — Z888 Allergy status to other drugs, medicaments and biological substances status: Secondary | ICD-10-CM | POA: Diagnosis not present

## 2018-05-19 DIAGNOSIS — R627 Adult failure to thrive: Secondary | ICD-10-CM | POA: Diagnosis present

## 2018-05-19 DIAGNOSIS — Z8673 Personal history of transient ischemic attack (TIA), and cerebral infarction without residual deficits: Secondary | ICD-10-CM | POA: Diagnosis not present

## 2018-05-19 DIAGNOSIS — Z7982 Long term (current) use of aspirin: Secondary | ICD-10-CM | POA: Diagnosis not present

## 2018-05-19 DIAGNOSIS — Z7189 Other specified counseling: Secondary | ICD-10-CM | POA: Diagnosis not present

## 2018-05-19 DIAGNOSIS — I214 Non-ST elevation (NSTEMI) myocardial infarction: Secondary | ICD-10-CM | POA: Diagnosis not present

## 2018-05-19 DIAGNOSIS — Z79890 Hormone replacement therapy (postmenopausal): Secondary | ICD-10-CM | POA: Diagnosis not present

## 2018-05-19 DIAGNOSIS — D509 Iron deficiency anemia, unspecified: Secondary | ICD-10-CM | POA: Diagnosis present

## 2018-05-19 DIAGNOSIS — I130 Hypertensive heart and chronic kidney disease with heart failure and stage 1 through stage 4 chronic kidney disease, or unspecified chronic kidney disease: Secondary | ICD-10-CM | POA: Diagnosis present

## 2018-05-19 DIAGNOSIS — E1122 Type 2 diabetes mellitus with diabetic chronic kidney disease: Secondary | ICD-10-CM | POA: Diagnosis present

## 2018-05-19 DIAGNOSIS — E11649 Type 2 diabetes mellitus with hypoglycemia without coma: Secondary | ICD-10-CM | POA: Diagnosis not present

## 2018-05-19 DIAGNOSIS — Z794 Long term (current) use of insulin: Secondary | ICD-10-CM | POA: Diagnosis not present

## 2018-05-19 DIAGNOSIS — R072 Precordial pain: Secondary | ICD-10-CM | POA: Diagnosis present

## 2018-05-19 DIAGNOSIS — I2510 Atherosclerotic heart disease of native coronary artery without angina pectoris: Secondary | ICD-10-CM | POA: Diagnosis present

## 2018-05-19 DIAGNOSIS — D508 Other iron deficiency anemias: Secondary | ICD-10-CM | POA: Diagnosis not present

## 2018-05-19 DIAGNOSIS — D649 Anemia, unspecified: Secondary | ICD-10-CM | POA: Diagnosis not present

## 2018-05-19 DIAGNOSIS — Z515 Encounter for palliative care: Secondary | ICD-10-CM | POA: Diagnosis not present

## 2018-05-19 DIAGNOSIS — F0150 Vascular dementia without behavioral disturbance: Secondary | ICD-10-CM | POA: Diagnosis present

## 2018-05-19 DIAGNOSIS — Z885 Allergy status to narcotic agent status: Secondary | ICD-10-CM | POA: Diagnosis not present

## 2018-05-19 DIAGNOSIS — I679 Cerebrovascular disease, unspecified: Secondary | ICD-10-CM | POA: Diagnosis present

## 2018-05-19 DIAGNOSIS — I160 Hypertensive urgency: Secondary | ICD-10-CM | POA: Diagnosis present

## 2018-05-19 DIAGNOSIS — R079 Chest pain, unspecified: Secondary | ICD-10-CM | POA: Diagnosis not present

## 2018-05-19 DIAGNOSIS — Z7902 Long term (current) use of antithrombotics/antiplatelets: Secondary | ICD-10-CM | POA: Diagnosis not present

## 2018-05-19 DIAGNOSIS — E785 Hyperlipidemia, unspecified: Secondary | ICD-10-CM | POA: Diagnosis present

## 2018-05-19 DIAGNOSIS — E875 Hyperkalemia: Secondary | ICD-10-CM | POA: Diagnosis present

## 2018-05-19 LAB — TROPONIN I
TROPONIN I: 0.13 ng/mL — AB (ref <0.03)
Troponin I: 0.15 ng/mL (ref <0.03)
Troponin I: 0.13 ng/mL (ref <0.03)

## 2018-05-19 LAB — BASIC METABOLIC PANEL
ANION GAP: 10 (ref 5–15)
BUN: 74 mg/dL — ABNORMAL HIGH (ref 6–20)
CO2: 22 mmol/L (ref 22–32)
Calcium: 9.2 mg/dL (ref 8.9–10.3)
Chloride: 107 mmol/L (ref 101–111)
Creatinine, Ser: 3.81 mg/dL — ABNORMAL HIGH (ref 0.44–1.00)
GFR calc Af Amer: 12 mL/min — ABNORMAL LOW (ref >60)
GFR, EST NON AFRICAN AMERICAN: 10 mL/min — AB (ref >60)
GLUCOSE: 122 mg/dL — AB (ref 65–99)
POTASSIUM: 4.5 mmol/L (ref 3.5–5.1)
Sodium: 139 mmol/L (ref 135–145)

## 2018-05-19 LAB — MRSA PCR SCREENING: MRSA BY PCR: NEGATIVE

## 2018-05-19 LAB — CBC
HEMATOCRIT: 23.9 % — AB (ref 36.0–46.0)
HEMOGLOBIN: 7.8 g/dL — AB (ref 12.0–15.0)
MCH: 29.4 pg (ref 26.0–34.0)
MCHC: 32.6 g/dL (ref 30.0–36.0)
MCV: 90.2 fL (ref 78.0–100.0)
Platelets: 155 10*3/uL (ref 150–400)
RBC: 2.65 MIL/uL — ABNORMAL LOW (ref 3.87–5.11)
RDW: 14.3 % (ref 11.5–15.5)
WBC: 4.6 10*3/uL (ref 4.0–10.5)

## 2018-05-19 LAB — GLUCOSE, CAPILLARY: GLUCOSE-CAPILLARY: 96 mg/dL (ref 65–99)

## 2018-05-19 LAB — HEPARIN LEVEL (UNFRACTIONATED): HEPARIN UNFRACTIONATED: 0.56 [IU]/mL (ref 0.30–0.70)

## 2018-05-19 LAB — PROTIME-INR
INR: 1.07
PROTHROMBIN TIME: 13.8 s (ref 11.4–15.2)

## 2018-05-19 LAB — ECHOCARDIOGRAM COMPLETE
HEIGHTINCHES: 66 in
Weight: 2183.44 oz

## 2018-05-19 LAB — APTT: aPTT: 29 seconds (ref 24–36)

## 2018-05-19 MED ORDER — NITROGLYCERIN IN D5W 200-5 MCG/ML-% IV SOLN
5.0000 ug/min | Freq: Once | INTRAVENOUS | Status: DC
  Filled 2018-05-19 (×2): qty 250

## 2018-05-19 MED ORDER — ASPIRIN EC 81 MG PO TBEC
81.0000 mg | DELAYED_RELEASE_TABLET | Freq: Every day | ORAL | Status: DC
  Administered 2018-05-19 – 2018-05-22 (×4): 81 mg via ORAL
  Filled 2018-05-19 (×4): qty 1

## 2018-05-19 MED ORDER — ASPIRIN 325 MG PO TABS
325.0000 mg | ORAL_TABLET | Freq: Once | ORAL | Status: AC
  Administered 2018-05-19: 325 mg via ORAL
  Filled 2018-05-19: qty 1

## 2018-05-19 MED ORDER — LORAZEPAM 2 MG/ML IJ SOLN
1.0000 mg | Freq: Four times a day (QID) | INTRAMUSCULAR | Status: DC | PRN
  Administered 2018-05-19: 1 mg via INTRAVENOUS
  Filled 2018-05-19: qty 1

## 2018-05-19 MED ORDER — ONDANSETRON HCL 4 MG/2ML IJ SOLN: 4.0000 mg | Freq: Four times a day (QID) | INTRAMUSCULAR | Status: DC | PRN

## 2018-05-19 MED ORDER — INSULIN NPH (HUMAN) (ISOPHANE) 100 UNIT/ML ~~LOC~~ SUSP
10.0000 [IU] | Freq: Every day | SUBCUTANEOUS | Status: DC
  Administered 2018-05-20 – 2018-05-21 (×2): 10 [IU] via SUBCUTANEOUS
  Filled 2018-05-19: qty 10

## 2018-05-19 MED ORDER — CLONIDINE HCL 0.2 MG PO TABS
0.4000 mg | ORAL_TABLET | Freq: Every morning | ORAL | Status: DC
  Administered 2018-05-19 – 2018-05-20 (×2): 0.4 mg via ORAL
  Filled 2018-05-19 (×2): qty 2

## 2018-05-19 MED ORDER — HYDROCODONE-ACETAMINOPHEN 5-325 MG PO TABS: 0.5000 | ORAL_TABLET | Freq: Four times a day (QID) | ORAL | Status: DC | PRN

## 2018-05-19 MED ORDER — ONDANSETRON HCL 4 MG PO TABS: 4.0000 mg | ORAL_TABLET | Freq: Four times a day (QID) | ORAL | Status: DC | PRN

## 2018-05-19 MED ORDER — DICLOFENAC SODIUM 1 % TD GEL
4.0000 g | Freq: Four times a day (QID) | TRANSDERMAL | Status: DC | PRN
  Filled 2018-05-19: qty 100

## 2018-05-19 MED ORDER — ACETAMINOPHEN 325 MG PO TABS: 650.0000 mg | ORAL_TABLET | Freq: Four times a day (QID) | ORAL | Status: DC | PRN

## 2018-05-19 MED ORDER — HEPARIN (PORCINE) IN NACL 100-0.45 UNIT/ML-% IJ SOLN
900.0000 [IU]/h | INTRAMUSCULAR | Status: DC
  Administered 2018-05-19: 750 [IU]/h via INTRAVENOUS
  Administered 2018-05-20: 850 [IU]/h via INTRAVENOUS
  Filled 2018-05-19 (×2): qty 250

## 2018-05-19 MED ORDER — CLONIDINE HCL 0.2 MG PO TABS
0.2000 mg | ORAL_TABLET | Freq: Every day | ORAL | Status: DC
  Administered 2018-05-19 – 2018-05-21 (×3): 0.2 mg via ORAL
  Filled 2018-05-19 (×3): qty 1

## 2018-05-19 MED ORDER — ACETAMINOPHEN 650 MG RE SUPP: 650.0000 mg | Freq: Four times a day (QID) | RECTAL | Status: DC | PRN

## 2018-05-19 MED ORDER — VITAMIN D 1000 UNITS PO TABS
1000.0000 [IU] | ORAL_TABLET | Freq: Every day | ORAL | Status: DC
  Administered 2018-05-19 – 2018-05-22 (×4): 1000 [IU] via ORAL
  Filled 2018-05-19 (×9): qty 1

## 2018-05-19 MED ORDER — CARVEDILOL 12.5 MG PO TABS
12.5000 mg | ORAL_TABLET | Freq: Two times a day (BID) | ORAL | Status: DC
  Administered 2018-05-19 – 2018-05-20 (×3): 12.5 mg via ORAL
  Filled 2018-05-19 (×4): qty 1

## 2018-05-19 MED ORDER — HEPARIN BOLUS VIA INFUSION
1850.0000 [IU] | INTRAVENOUS | Status: AC
  Administered 2018-05-19: 1850 [IU] via INTRAVENOUS
  Filled 2018-05-19: qty 1850

## 2018-05-19 MED ORDER — HALOPERIDOL LACTATE 5 MG/ML IJ SOLN
2.0000 mg | Freq: Four times a day (QID) | INTRAMUSCULAR | Status: DC | PRN
  Administered 2018-05-19: 2 mg via INTRAVENOUS
  Filled 2018-05-19: qty 1

## 2018-05-19 MED ORDER — CLONIDINE HCL 0.2 MG PO TABS: 0.2000 mg | ORAL_TABLET | ORAL | Status: DC

## 2018-05-19 MED ORDER — INSULIN NPH (HUMAN) (ISOPHANE) 100 UNIT/ML ~~LOC~~ SUSP: 10.0000 [IU] | Freq: Two times a day (BID) | SUBCUTANEOUS | Status: DC

## 2018-05-19 MED ORDER — ROSUVASTATIN CALCIUM 5 MG PO TABS
5.0000 mg | ORAL_TABLET | Freq: Every day | ORAL | Status: DC
  Administered 2018-05-19 – 2018-05-22 (×4): 5 mg via ORAL
  Filled 2018-05-19 (×4): qty 1

## 2018-05-19 MED ORDER — NITROGLYCERIN IN D5W 200-5 MCG/ML-% IV SOLN
0.0000 ug/min | INTRAVENOUS | Status: DC
  Administered 2018-05-19: 100 ug/min via INTRAVENOUS
  Administered 2018-05-20: 135 ug/min via INTRAVENOUS
  Administered 2018-05-20: 120 ug/min via INTRAVENOUS
  Administered 2018-05-20 – 2018-05-21 (×2): 130 ug/min via INTRAVENOUS
  Filled 2018-05-19 (×5): qty 250

## 2018-05-19 MED ORDER — ISOSORBIDE MONONITRATE ER 60 MG PO TB24
120.0000 mg | ORAL_TABLET | Freq: Every day | ORAL | Status: DC
  Administered 2018-05-19 – 2018-05-22 (×4): 120 mg via ORAL
  Filled 2018-05-19 (×4): qty 2

## 2018-05-19 MED ORDER — LEVOTHYROXINE SODIUM 50 MCG PO TABS
50.0000 ug | ORAL_TABLET | Freq: Every day | ORAL | Status: DC
  Administered 2018-05-19 – 2018-05-22 (×4): 50 ug via ORAL
  Filled 2018-05-19: qty 1
  Filled 2018-05-19: qty 2
  Filled 2018-05-19: qty 1
  Filled 2018-05-19: qty 2
  Filled 2018-05-19: qty 1
  Filled 2018-05-19: qty 2
  Filled 2018-05-19 (×2): qty 1
  Filled 2018-05-19: qty 2

## 2018-05-19 MED ORDER — SODIUM CHLORIDE 0.9% FLUSH: 3.0000 mL | INTRAVENOUS | Status: DC | PRN

## 2018-05-19 MED ORDER — CLOPIDOGREL BISULFATE 75 MG PO TABS
75.0000 mg | ORAL_TABLET | Freq: Every day | ORAL | Status: DC
  Administered 2018-05-19 – 2018-05-22 (×4): 75 mg via ORAL
  Filled 2018-05-19 (×4): qty 1

## 2018-05-19 MED ORDER — SODIUM CHLORIDE 0.9% FLUSH
3.0000 mL | Freq: Two times a day (BID) | INTRAVENOUS | Status: DC
  Administered 2018-05-19 – 2018-05-22 (×7): 3 mL via INTRAVENOUS

## 2018-05-19 MED ORDER — SODIUM CHLORIDE 0.9 % IV SOLN
250.0000 mL | INTRAVENOUS | Status: DC | PRN
  Administered 2018-05-21: 250 mL via INTRAVENOUS

## 2018-05-19 MED ORDER — SODIUM POLYSTYRENE SULFONATE 15 GM/60ML PO SUSP
30.0000 g | Freq: Once | ORAL | Status: AC
  Administered 2018-05-19: 30 g via ORAL
  Filled 2018-05-19: qty 120

## 2018-05-19 MED ORDER — INSULIN GLARGINE 100 UNIT/ML ~~LOC~~ SOLN
10.0000 [IU] | Freq: Every day | SUBCUTANEOUS | Status: DC
  Administered 2018-05-19 – 2018-05-21 (×3): 10 [IU] via SUBCUTANEOUS
  Filled 2018-05-19 (×3): qty 0.1

## 2018-05-19 MED ORDER — INSULIN NPH (HUMAN) (ISOPHANE) 100 UNIT/ML ~~LOC~~ SUSP
10.0000 [IU] | Freq: Every day | SUBCUTANEOUS | Status: DC
  Administered 2018-05-19 – 2018-05-21 (×3): 10 [IU] via SUBCUTANEOUS
  Filled 2018-05-19: qty 10

## 2018-05-19 MED ORDER — TORSEMIDE 20 MG PO TABS
20.0000 mg | ORAL_TABLET | Freq: Two times a day (BID) | ORAL | Status: DC
  Administered 2018-05-19 – 2018-05-22 (×8): 20 mg via ORAL
  Filled 2018-05-19 (×8): qty 1

## 2018-05-19 MED ORDER — BASAGLAR KWIKPEN 100 UNIT/ML ~~LOC~~ SOPN: 10.0000 [IU] | PEN_INJECTOR | Freq: Every day | SUBCUTANEOUS | Status: DC

## 2018-05-19 MED ORDER — PANTOPRAZOLE SODIUM 40 MG PO TBEC
40.0000 mg | DELAYED_RELEASE_TABLET | Freq: Every day | ORAL | Status: DC
  Administered 2018-05-19 – 2018-05-22 (×4): 40 mg via ORAL
  Filled 2018-05-19 (×4): qty 1

## 2018-05-19 MED ORDER — HYDRALAZINE HCL 25 MG PO TABS
50.0000 mg | ORAL_TABLET | Freq: Three times a day (TID) | ORAL | Status: DC
  Administered 2018-05-19 – 2018-05-22 (×9): 50 mg via ORAL
  Filled 2018-05-19 (×11): qty 2

## 2018-05-19 NOTE — Progress Notes (Signed)
*  PRELIMINARY RESULTS* Echocardiogram 2D Echocardiogram has been performed.  Leavy Cella 05/19/2018, 9:37 AM

## 2018-05-19 NOTE — Progress Notes (Signed)
ANTICOAGULATION CONSULT NOTE - Follow Up Consult  Pharmacy Consult for : heparin dosing Indication: atrial fibrillation, NSTEMI  Allergies  Allergen Reactions  . Lovastatin   . Propoxyphene N-Acetaminophen     Pt does not know reaction  . Simvastatin Other (See Comments)    headache    Patient Measurements: Height: 5\' 6"  (167.6 cm) Weight: 136 lb 7.4 oz (61.9 kg) IBW/kg (Calculated) : 59.3 Heparin Dosing Weight: HEPARIN DW (KG): 61.9  Vital Signs: Temp: 98.2 F (36.8 C) (06/22 1119) Temp Source: Oral (06/22 1119) BP: 192/60 (06/22 1000) Pulse Rate: 70 (06/22 1119)  Labs: Recent Labs    05/18/18 1939 05/18/18 2324 05/19/18 0124 05/19/18 1053 05/19/18 1837  HGB 7.6*  --   --  7.8*  --   HCT 23.7*  --   --  23.9*  --   PLT 161  --   --  155  --   APTT  --   --  29  --   --   LABPROT  --   --  13.8  --   --   INR  --   --  1.07  --   --   HEPARINUNFRC  --   --   --  <0.10* 0.56  CREATININE 3.88*  --   --  3.81*  --   TROPONINI 0.05* 0.15*  --  0.13*  --     Estimated Creatinine Clearance: 10.5 mL/min (A) (by C-G formula based on SCr of 3.81 mg/dL (H)).    Assessment: Heparin level: 0.56 IU/mL (in desired goal range)  Goal of Therapy:  Heparin level 0.3-0.7 units/ml Monitor platelets by anticoagulation protocol: Yes   Plan:   Continue heparin infusion at 850 units/hr Check anti-Xa leveldaily while on heparin Continue to monitor H&H and platelets  Despina Pole 05/19/2018,7:25 PM

## 2018-05-19 NOTE — Progress Notes (Signed)
PROGRESS NOTE                                                                                                                                                                                                             Patient Demographics:    [name], is a 84 y.o. female, DOB - 17-Dec-1931, NKN:[id number]  Admit date - 05/18/2018   Admitting Physician Pratik Darleen Crocker, DO  Outpatient Primary MD for the patient is Timmothy Euler, MD  LOS - 0  Outpatient Specialists:  Chief Complaint  Patient presents with  . Chest Pain       Refer to admission H&P from this morning in detail  Brief Narrative   84 year old female with chronic kidney disease stage IV, insulin-dependent diabetes mellitus, hypertension, dyslipidemia, dementia, history of CVA, DM, CAD with history of MI and refused aggressive intervention including cardiac cath in the past was brought by EMS for chest pain on the afternoon of admission.  She took 3 nitroglycerin at home with minimal relief and also took a full dose of aspirin.  Reported pain to be pressure-like in the substernal area without any radiation. She was found to have elevated blood pressure readings and started on nitroglycerin drip with some improvement.  She was found to have elevated troponin initially of 0.05 then subsequent of 0.15 and was started on heparin drip.  Other labs showed potassium of 5.4.  CT of the abdomen and pelvis without contrast showed no acute findings. Patient admitted to stepdown unit for NSTEMI.   Subjective:   Patient agitated this morning wanting to go home stating she would like to die at home rather than stay in the hospital.  Also insist on not wanting any more treatment.   Assessment  & Plan :    Principal Problem:   NSTEMI (non-ST elevated myocardial infarction) ([place])  Tarted on IV heparin.  Follow 2D echo results.  Continue home dose aspirin, Coreg and  statin.  Continue nitroglycerin drip until blood pressure is improved.  Declined cardiac cath in the past and insists on not wanting any aggressive measures, agitated on wanting to go home and would like to pursue comfort measures. She will benefit from palliative care evaluation if she plans to stay back in the hospital.  She is at high risk for signing out AMA. Daughter involved  in care and once her to get adequate medical treatment.     Active Problems: Hypertensive urgency Continue on nitroglycerin drip and monitor.  Agitation Ordered for PRN Haldol and low-dose Ativan as needed.  History of CVA with mild vascular dementia Continue aspirin and statin.    Chronic kidney disease, stage IV (severe) (HCC) Was evaluated by nephrology in the past and discussed dialysis options.  Appears that patient has refused dialysis.  Insulin-dependent diabetes mellitus Continue home dose insulin and monitor on sliding scale coverage.  Chronic diastolic CHF Euvolemic.  Iron deficiency anemia Continue supplement.   Code Status : DNR  Family Communication  : Daughter at bedside  Disposition Plan  : Home pending clinical improvement  Barriers For Discharge : Active symptoms  Consults  : None  Procedures 2D echo, CT of the abdomen  DVT Prophylaxis  : Heparin  Lab Results  Component Value Date   PLT 155 05/19/2018    Antibiotics  :    Anti-infectives (From admission, onward)   None        Objective:   Vitals:   05/19/18 0800 05/19/18 0900 05/19/18 1000 05/19/18 1119  BP: (!) 188/62  (!) 192/60   Pulse: 71 76 85 70  Resp: 14 15 17 13   Temp:    98.2 F (36.8 C)  TempSrc:    Oral  SpO2: 98% 98% 98% 98%  Weight:      Height:        Wt Readings from Last 3 Encounters:  05/19/18 61.9 kg (136 lb 7.4 oz)  04/26/18 54.9 kg (121 lb)  04/02/18 62.8 kg (138 lb 6.4 oz)    No intake or output data in the 24 hours ending 05/19/18 1316   Physical Exam  Gen: not in  distress, irritable and agitated HEENT: , moist mucosa, supple neck Chest: clear b/l, no added sounds CVS: N S1&S2, no murmurs GI: soft, NT, ND, BS+ Musculoskeletal: warm, no edema     Data Review:    CBC Recent Labs  Lab 05/18/18 1939 05/19/18 1053  WBC 3.7* 4.6  HGB 7.6* 7.8*  HCT 23.7* 23.9*  PLT 161 155  MCV 90.8 90.2  MCH 29.1 29.4  MCHC 32.1 32.6  RDW 14.2 14.3  LYMPHSABS 0.7  --   MONOABS 0.3  --   EOSABS 0.1  --   BASOSABS 0.0  --     Chemistries  Recent Labs  Lab 05/18/18 1939 05/19/18 1053  NA 137 139  K 5.4* 4.5  CL 106 107  CO2 23 22  GLUCOSE 96 122*  BUN 75* 74*  CREATININE 3.88* 3.81*  CALCIUM 9.0 9.2  AST 12*  --   ALT 7*  --   ALKPHOS 38  --   BILITOT 0.6  --    ------------------------------------------------------------------------------------------------------------------ No results for input(s): CHOL, HDL, LDLCALC, TRIG, CHOLHDL, LDLDIRECT in the last 72 hours.  Lab Results  Component Value Date   HGBA1C 6.6 (H) 12/24/2017   ------------------------------------------------------------------------------------------------------------------ No results for input(s): TSH, T4TOTAL, T3FREE, THYROIDAB in the last 72 hours.  Invalid input(s): FREET3 ------------------------------------------------------------------------------------------------------------------ No results for input(s): VITAMINB12, FOLATE, FERRITIN, TIBC, IRON, RETICCTPCT in the last 72 hours.  Coagulation profile Recent Labs  Lab 05/19/18 0124  INR 1.07    No results for input(s): DDIMER in the last 72 hours.  Cardiac Enzymes Recent Labs  Lab 05/18/18 1939 05/18/18 2324 05/19/18 1053  TROPONINI 0.05* 0.15* 0.13*   ------------------------------------------------------------------------------------------------------------------    Component Value Date/Time  BNP 1,243.0 (H) 09/20/2017 1940    Inpatient Medications  Scheduled Meds: . aspirin EC  81 mg  Oral Daily  . carvedilol  12.5 mg Oral BID WC  . cholecalciferol  1,000 Units Oral Daily  . cloNIDine  0.2 mg Oral QHS  . cloNIDine  0.4 mg Oral q morning - 10a  . clopidogrel  75 mg Oral Daily  . hydrALAZINE  50 mg Oral Q8H  . insulin glargine  10 Units Subcutaneous QHS  . insulin NPH Human  10 Units Subcutaneous QAC breakfast  . insulin NPH Human  10 Units Subcutaneous QHS  . isosorbide mononitrate  120 mg Oral Daily  . levothyroxine  50 mcg Oral QAC breakfast  . pantoprazole  40 mg Oral Daily  . rosuvastatin  5 mg Oral Daily  . sodium chloride flush  3 mL Intravenous Q12H  . torsemide  20 mg Oral BID   Continuous Infusions: . sodium chloride    . heparin 750 Units/hr (05/19/18 0152)  . nitroGLYCERIN 125 mcg/min (05/19/18 1239)   PRN Meds:.sodium chloride, acetaminophen **OR** acetaminophen, diclofenac sodium, haloperidol lactate, HYDROcodone-acetaminophen, LORazepam, ondansetron **OR** ondansetron (ZOFRAN) IV, sodium chloride flush  Micro Results Recent Results (from the past 240 hour(s))  MRSA PCR Screening     Status: None   Collection Time: 05/19/18  6:22 AM  Result Value Ref Range Status   MRSA by PCR NEGATIVE NEGATIVE Final    Comment:        The GeneXpert MRSA Assay (FDA approved for NASAL specimens only), is one component of a comprehensive MRSA colonization surveillance program. It is not intended to diagnose MRSA infection nor to guide or monitor treatment for MRSA infections. Performed at [facility], [address] [place], [place] [address]     Radiology Reports Ct Abdomen Pelvis Wo Contrast  Result Date: 05/18/2018 CLINICAL DATA:  Umbilical and lower abdominal pain EXAM: CT ABDOMEN AND PELVIS WITHOUT CONTRAST TECHNIQUE: Multidetector CT imaging of the abdomen and pelvis was performed following the standard protocol without IV contrast. COMPARISON:  CT 03/04/2017, 05/14/2013 FINDINGS: Lower chest: Lung bases demonstrate patchy dependent atelectasis.  Trace pleural effusions. Dilated pulmonary trunk measuring up to 4.5 cm. Aortic atherosclerosis. Extensive coronary vascular calcification. Mild cardiomegaly. No large pericardial effusion. Hepatobiliary: No focal liver abnormality is seen. No gallstones, gallbladder wall thickening, or biliary dilatation. Pancreas: Unremarkable. No pancreatic ductal dilatation or surrounding inflammatory changes. Spleen: Normal in size without focal abnormality. Adrenals/Urinary Tract: Adrenal glands are normal. Complex cyst upper pole left kidney with peripheral calcification measuring 18 mm, no change in size but increased calcification. Atrophic kidneys. Probable parapelvic cysts on the left. Bladder normal. Stomach/Bowel: The stomach is nonenlarged. No dilated small bowel. No colon wall thickening. Status post appendectomy. Sigmoid colon diverticular disease without acute inflammation. Moderate feces in the rectum Vascular/Lymphatic: Extensive aortic atherosclerosis. No aneurysmal dilatation. No significantly enlarged lymph nodes. Reproductive: Enlarged uterus for age. Stable 4.6 cm left adnexal mass presumably an exophytic fibroid given lack of significant interval change. Other: Negative for free air or free fluid. Fat in the umbilical region. Musculoskeletal: Grade 1 anterolisthesis L3 on L4 with associated degenerative changes. No acute or suspicious abnormality. Old sternal fracture. IMPRESSION: 1. No definite CT evidence for acute intra-abdominal or pelvic abnormality. 2. Trace pleural effusions. Cardiomegaly. Dilated pulmonary arterial trunk suggesting pulmonary hypertension. 3. Sigmoid colon diverticular disease without acute inflammation 4. Atrophic kidneys 5. Enlarged uterus for age. Similar appearance of probable left exophytic fibroid. Electronically Signed   By:  Donavan Foil M.D.   On: 05/18/2018 23:12   Dg Chest 2 View  Result Date: 05/18/2018 CLINICAL DATA:  84 year old female with chest pain since 1400  hours. EXAM: CHEST - 2 VIEW COMPARISON:  Chest radiographs 03/05/2018 and earlier. FINDINGS: Upright AP and lateral views of chest. Stable lung volumes. Possible small pleural effusions. Stable pulmonary vascularity, no acute edema. Stable cardiomegaly and mediastinal contours. Calcified aortic atherosclerosis. Visualized tracheal air column is within normal limits. No pneumothorax. No acute osseous abnormality identified. Negative visible bowel gas pattern. IMPRESSION: 1. Possible small pleural effusions, not significantly changed since April. No acute cardiopulmonary abnormality. 2. Chronic cardiomegaly, Aortic Atherosclerosis (ICD10-I70.0). Electronically Signed   By: Genevie Ann M.D.   On: 05/18/2018 20:07    Time Spent in minutes  25   Tillie Viverette M.D on 05/19/2018 at 1:16 PM  Between 7am to 7pm - Pager - [phone]  After 7pm go to [email or web address] - password [facility]  [facility] -  Office  [phone]

## 2018-05-19 NOTE — H&P (Addendum)
History and Physical    [name] QMV:[id number] DOB: Aug 22, 1932 DOA: 05/18/2018  PCP: Timmothy Euler, MD   Patient coming from: Home  Chief Complaint: Chest pain  HPI: [name] is a 84 y.o. female with medical history significant for CKD stage IV, insulin-dependent diabetes, hypertension, dyslipidemia, dementia, CVA, CAD, and hypothyroidism who was brought in via EMS for chest pain that began at approximately 2 PM yesterday.  She took 3 nitroglycerin at home before EMS arrival with little relief and she also took a full dose aspirin.  She states that her pain was more of a pressure sensation that was substernal with no radiation.  She denies any particular aggravating or alleviating factors and complains more of generalized abdominal pain currently.  She has known history of CAD and prior MI and has refused any aggressive intervention to include cardiac catheterization in the past.  She states the same now and wants to be conservatively managed.  She is a DNR.   ED Course: Vital signs are stable with some mildly needed blood pressure readings.  As a result, she has been started on a nitroglycerin drip with some improvement.  Heparin drip has also been initiated.  Her hemoglobin is 7.6 compared to 9.3 almost a month prior.  Potassium is 5.4.  Troponin initial was 0.05 and repeat was 0.15.  EKG with no significant changes and is currently at sinus rhythm at 51 bpm.  CT of the abdomen and pelvis without contrast demonstrates no acute findings aside from some dilated pulmonary vessels suggestive of pulmonary hypertension and some mild cardiomegaly with trace effusions.  Review of Systems: All others reviewed and otherwise negative.  Past Medical History:  Diagnosis Date  . CAD (coronary artery disease) 2003   Last catheterization 2008. Two-vessel PCI of the first OM branch and mid LAD.  Marland Kitchen CKD (chronic kidney disease)   . Congestive heart failure ([place]) 02/2017   grade 2 diastolic  dysfunction  . CVA (cerebral vascular accident) ([place]) [place]   . Dementia   . DJD (degenerative joint disease) of lumbar spine   . Hypercholesteremia   . Hypertension   . IDDM (insulin dependent diabetes mellitus) ([place])     Past Surgical History:  Procedure Laterality Date  . APPENDECTOMY    . KNEE SURGERY       reports that she has never smoked. She has never used smokeless tobacco. She reports that she does not drink alcohol or use drugs.  Allergies  Allergen Reactions  . Propoxyphene N-Acetaminophen     Pt does not know reaction  . Simvastatin Other (See Comments)    headache    Family History  Problem Relation Age of Onset  . Heart attack Mother 9  . Heart attack Father 62  . Diabetes Brother        RETINOPATHY   . Drug abuse Brother   . Liver cancer Brother   . Breast cancer Daughter   . Diabetes Sister     Prior to Admission medications   Medication Sig Start Date End Date Taking? Authorizing Provider  aspirin EC 81 MG EC tablet Take 1 tablet (81 mg total) by mouth daily. 10/08/16   Bhagat, Crista Luria, PA  carvedilol (COREG) 12.5 MG tablet TAKE  (1)  TABLET TWICE A DAY. 05/01/18   Timmothy Euler, MD  cholecalciferol (VITAMIN D) 1000 units tablet Take 1,000 Units by mouth daily.    [provider]  cloNIDine (CATAPRES) 0.2  MG tablet Take 0.2-0.4 mg by mouth See admin instructions. Give two tablets in the morning and one tablet in the evening 01/30/18   [provider]  clopidogrel (PLAVIX) 75 MG tablet TAKE 1 TABLET DAILY 03/08/18   Timmothy Euler, MD  diclofenac sodium (VOLTAREN) 1 % GEL Apply 4 g topically 4 (four) times daily. Patient taking differently: Apply 4 g topically 4 (four) times daily as needed (FOR PAIN).  07/29/16   Timmothy Euler, MD  glucose blood (ACCU-CHEK AVIVA PLUS) test strip CHECK BLOOD SUGER UP TO 3 TIMES A DAY 01/02/18   Timmothy Euler, MD  hydrALAZINE (APRESOLINE) 50 MG tablet Take 1 tablet (50 mg  total) by mouth every 8 (eight) hours. 04/26/18 05/26/18  Timmothy Euler, MD  HYDROcodone-acetaminophen (NORCO) 5-325 MG tablet Take 0.5-1 tablets every 6 (six) hours as needed by mouth for moderate pain. Patient not taking: Reported on 05/08/2018 10/09/17   Timmothy Euler, MD  Insulin Glargine [facility]) 100 UNIT/ML SOPN Inject 0.1 mLs (10 Units total) into the skin at bedtime. Patient not taking: Reported on 04/26/2018 03/13/18   Timmothy Euler, MD  insulin NPH Human (NOVOLIN N) 100 UNIT/ML injection Inject 0.1 mLs (10 Units total) into the skin 2 (two) times daily before a meal. 03/01/18   Timmothy Euler, MD  isosorbide mononitrate (IMDUR) 120 MG 24 hr tablet Take 1 tablet (120 mg total) by mouth daily. 11/27/17   Charlynne Cousins, MD  levothyroxine (SYNTHROID, LEVOTHROID) 50 MCG tablet TAKE 1 TABLET DAILY 04/03/17   Timmothy Euler, MD  nitroGLYCERIN (NITROSTAT) 0.4 MG SL tablet PLACE 1 TABLET UNDER THE TONGUE AT ONSET OF CHEST PAIN EVERY 5 MINTUES UP TO 3 TIMES AS NEEDED 05/01/18   Timmothy Euler, MD  pantoprazole (PROTONIX) 40 MG tablet TAKE 1 TABLET DAILY 01/15/18   Timmothy Euler, MD  rosuvastatin (CRESTOR) 5 MG tablet Take 1 tablet (5 mg total) by mouth daily. 03/13/18   Timmothy Euler, MD  torsemide (DEMADEX) 20 MG tablet Take 1 tablet (20 mg total) by mouth 2 (two) times daily. 01/15/18   Timmothy Euler, MD    Physical Exam: Vitals:   05/19/18 0248 05/19/18 0251 05/19/18 0254 05/19/18 0257  BP: (!) 167/55 (!) 173/48 (!) 186/55 (!) 188/47  Pulse: (!) 57 (!) 59 (!) 57 61  Resp: 10 10 12 10   Temp:      TempSrc:      SpO2: 92% 98% 98% 98%    Constitutional: NAD, calm, comfortable Vitals:   05/19/18 0248 05/19/18 0251 05/19/18 0254 05/19/18 0257  BP: (!) 167/55 (!) 173/48 (!) 186/55 (!) 188/47  Pulse: (!) 57 (!) 59 (!) 57 61  Resp: 10 10 12 10   Temp:      TempSrc:      SpO2: 92% 98% 98% 98%   Eyes: lids and conjunctivae normal ENMT: Mucous  membranes are moist.  Neck: normal, supple Respiratory: clear to auscultation bilaterally. Normal respiratory effort. No accessory muscle use.  On nasal cannula. Cardiovascular: Regular rate and rhythm, no murmurs. No extremity edema. Abdomen: no tenderness, no distention. Bowel sounds positive.  Musculoskeletal:  No joint deformity upper and lower extremities.   Skin: no rashes, lesions, ulcers.  Yellowish skin. Psychiatric: Normal judgment and insight. Alert and oriented x 3. Normal mood.   Labs on Admission: I have personally reviewed following labs and imaging studies  CBC: Recent Labs  Lab 05/18/18 1939  WBC 3.7*  NEUTROABS 2.6  HGB 7.6*  HCT 23.7*  MCV 90.8  PLT 016   Basic Metabolic Panel: Recent Labs  Lab 05/18/18 1939  NA 137  K 5.4*  CL 106  CO2 23  GLUCOSE 96  BUN 75*  CREATININE 3.88*  CALCIUM 9.0   GFR: CrCl cannot be calculated (Unknown ideal weight.). Liver Function Tests: Recent Labs  Lab 05/18/18 1939  AST 12*  ALT 7*  ALKPHOS 38  BILITOT 0.6  PROT 5.6*  ALBUMIN 3.0*   Recent Labs  Lab 05/18/18 1939  LIPASE 31   No results for input(s): AMMONIA in the last 168 hours. Coagulation Profile: Recent Labs  Lab 05/19/18 0124  INR 1.07   Cardiac Enzymes: Recent Labs  Lab 05/18/18 1939 05/18/18 2324  TROPONINI 0.05* 0.15*   BNP (last 3 results) No results for input(s): PROBNP in the last 8760 hours. HbA1C: No results for input(s): HGBA1C in the last 72 hours. CBG: No results for input(s): GLUCAP in the last 168 hours. Lipid Profile: No results for input(s): CHOL, HDL, LDLCALC, TRIG, CHOLHDL, LDLDIRECT in the last 72 hours. Thyroid Function Tests: No results for input(s): TSH, T4TOTAL, FREET4, T3FREE, THYROIDAB in the last 72 hours. Anemia Panel: No results for input(s): VITAMINB12, FOLATE, FERRITIN, TIBC, IRON, RETICCTPCT in the last 72 hours. Urine analysis:    Component Value Date/Time   COLORURINE YELLOW 10/15/2017 0910    APPEARANCEUR Clear 11/16/2017 1213   LABSPEC 1.012 10/15/2017 0910   PHURINE 5.0 10/15/2017 0910   GLUCOSEU Trace (A) 11/16/2017 1213   HGBUR NEGATIVE 10/15/2017 0910   BILIRUBINUR Negative 11/16/2017 1213   KETONESUR NEGATIVE 10/15/2017 0910   PROTEINUR 3+ (A) 11/16/2017 1213   PROTEINUR 100 (A) 10/15/2017 0910   UROBILINOGEN negative 07/02/2015 1505   UROBILINOGEN 0.2 05/10/2015 1245   NITRITE Negative 11/16/2017 1213   NITRITE NEGATIVE 10/15/2017 0910   LEUKOCYTESUR Trace (A) 11/16/2017 1213    Radiological Exams on Admission: Ct Abdomen Pelvis Wo Contrast  Result Date: 05/18/2018 CLINICAL DATA:  Umbilical and lower abdominal pain EXAM: CT ABDOMEN AND PELVIS WITHOUT CONTRAST TECHNIQUE: Multidetector CT imaging of the abdomen and pelvis was performed following the standard protocol without IV contrast. COMPARISON:  CT 03/04/2017, 05/14/2013 FINDINGS: Lower chest: Lung bases demonstrate patchy dependent atelectasis. Trace pleural effusions. Dilated pulmonary trunk measuring up to 4.5 cm. Aortic atherosclerosis. Extensive coronary vascular calcification. Mild cardiomegaly. No large pericardial effusion. Hepatobiliary: No focal liver abnormality is seen. No gallstones, gallbladder wall thickening, or biliary dilatation. Pancreas: Unremarkable. No pancreatic ductal dilatation or surrounding inflammatory changes. Spleen: Normal in size without focal abnormality. Adrenals/Urinary Tract: Adrenal glands are normal. Complex cyst upper pole left kidney with peripheral calcification measuring 18 mm, no change in size but increased calcification. Atrophic kidneys. Probable parapelvic cysts on the left. Bladder normal. Stomach/Bowel: The stomach is nonenlarged. No dilated small bowel. No colon wall thickening. Status post appendectomy. Sigmoid colon diverticular disease without acute inflammation. Moderate feces in the rectum Vascular/Lymphatic: Extensive aortic atherosclerosis. No aneurysmal dilatation. No  significantly enlarged lymph nodes. Reproductive: Enlarged uterus for age. Stable 4.6 cm left adnexal mass presumably an exophytic fibroid given lack of significant interval change. Other: Negative for free air or free fluid. Fat in the umbilical region. Musculoskeletal: Grade 1 anterolisthesis L3 on L4 with associated degenerative changes. No acute or suspicious abnormality. Old sternal fracture. IMPRESSION: 1. No definite CT evidence for acute intra-abdominal or pelvic abnormality. 2. Trace pleural effusions. Cardiomegaly. Dilated pulmonary arterial trunk suggesting pulmonary hypertension. 3.  Sigmoid colon diverticular disease without acute inflammation 4. Atrophic kidneys 5. Enlarged uterus for age. Similar appearance of probable left exophytic fibroid. Electronically Signed   By: Donavan Foil M.D.   On: 05/18/2018 23:12   Dg Chest 2 View  Result Date: 05/18/2018 CLINICAL DATA:  84 year old female with chest pain since 1400 hours. EXAM: CHEST - 2 VIEW COMPARISON:  Chest radiographs 03/05/2018 and earlier. FINDINGS: Upright AP and lateral views of chest. Stable lung volumes. Possible small pleural effusions. Stable pulmonary vascularity, no acute edema. Stable cardiomegaly and mediastinal contours. Calcified aortic atherosclerosis. Visualized tracheal air column is within normal limits. No pneumothorax. No acute osseous abnormality identified. Negative visible bowel gas pattern. IMPRESSION: 1. Possible small pleural effusions, not significantly changed since April. No acute cardiopulmonary abnormality. 2. Chronic cardiomegaly, Aortic Atherosclerosis (ICD10-I70.0). Electronically Signed   By: Genevie Ann M.D.   On: 05/18/2018 20:07    EKG: Independently reviewed. SR at 51bpm.  Assessment/Plan Principal Problem:   NSTEMI (non-ST elevated myocardial infarction) ([place]) Active Problems:   Hyperlipidemia   Cerebrovascular disease   HTN (hypertension)   Chronic kidney disease, stage IV (severe) (HCC)   CAD  (coronary artery disease), native coronary artery    1. NSTEMI.  Patient is a DNR and favors conservative management and specifically denies any cardiac catheterization or aggressive measures.  She has decision-making capacity.  Continue to trend troponin levels and maintain on heparin drip.  Continue home aspirin, Coreg, and Crestor.  2D echocardiogram.  Continue nitroglycerin drip until blood pressure improved.  Patient will likely require palliative care evaluation for home hospice care and comfort measures in the near future. 2. Mild hyperkalemia.  Administer 1 dose of Kayexalate and follow labs. 3. CKD stage IV.  Currently at her baseline. 4. DM.  Continue home insulin regimen and add sliding scale insulin as needed.  Currently euglycemic. 5. Hypertension.  Continue home blood pressure medications. 6. Dyslipidemia.  Continue statin. 7. Hypothyroidism.  Continue Synthroid.   DVT prophylaxis: Heparin drip Code Status: DNR Family Communication: None at bedside Disposition Plan:Conservative management of NSTEMI with likely palliative care evaluation soon Consults called:None Admission status: Inpt, SDU   [facility] Pager [phone]  If 7PM-7AM, please contact night-coverage [email or web address] Password [email or web address]  05/19/2018, 3:01 AM

## 2018-05-19 NOTE — Progress Notes (Signed)
ANTICOAGULATION CONSULT NOTE - Initial Consult  Pharmacy Consult for heparin dosing Indication: atrial fibrillation/ NSTEMI  Allergies  Allergen Reactions  . Lovastatin   . Propoxyphene N-Acetaminophen     Pt does not know reaction  . Simvastatin Other (See Comments)    headache    Patient Measurements: Height: 5\' 6"  (167.6 cm) Weight: 136 lb 7.4 oz (61.9 kg) IBW/kg (Calculated) : 59.3 Heparin Dosing Weight: HEPARIN DW (KG): 61.9  Vital Signs: Temp: 98.2 F (36.8 C) (06/22 1119) Temp Source: Oral (06/22 1119) BP: 192/60 (06/22 1000) Pulse Rate: 70 (06/22 1119)  Labs: Recent Labs    05/18/18 1939 05/18/18 2324 05/19/18 0124 05/19/18 1053  HGB 7.6*  --   --  7.8*  HCT 23.7*  --   --  23.9*  PLT 161  --   --  155  APTT  --   --  29  --   LABPROT  --   --  13.8  --   INR  --   --  1.07  --   HEPARINUNFRC  --   --   --  <0.10*  CREATININE 3.88*  --   --  3.81*  TROPONINI 0.05* 0.15*  --  0.13*    Estimated Creatinine Clearance: 10.5 mL/min (A) (by C-G formula based on SCr of 3.81 mg/dL (H)).   Medical History: Past Medical History:  Diagnosis Date  . CAD (coronary artery disease) 2003   Last catheterization 2008. Two-vessel PCI of the first OM branch and mid LAD.  Marland Kitchen CKD (chronic kidney disease)   . Congestive heart failure ([place]) 02/2017   grade 2 diastolic dysfunction  . CVA (cerebral vascular accident) ([place]) [place]   . Dementia   . DJD (degenerative joint disease) of lumbar spine   . Hypercholesteremia   . Hypertension   . IDDM (insulin dependent diabetes mellitus) ([place])     Medications:  Medications Prior to Admission  Medication Sig Dispense Refill Last Dose  . aspirin EC 81 MG EC tablet Take 1 tablet (81 mg total) by mouth daily. 30 tablet 11 Past Week at Unknown time  . carvedilol (COREG) 12.5 MG tablet TAKE  (1)  TABLET TWICE A DAY. 60 tablet 2 Past Week at 7am  . cholecalciferol (VITAMIN D) 1000 units tablet Take 1,000 Units by mouth  daily.   Past Week at Unknown time  . cloNIDine (CATAPRES) 0.2 MG tablet Take 0.2 mg by mouth 3 (three) times daily.    Past Week at Unknown time  . clopidogrel (PLAVIX) 75 MG tablet TAKE 1 TABLET DAILY 30 tablet 2 Past Week at Unknown time  . hydrALAZINE (APRESOLINE) 50 MG tablet Take 1 tablet (50 mg total) by mouth every 8 (eight) hours. 90 tablet 0 Past Week at Unknown time  . insulin NPH Human (NOVOLIN N) 100 UNIT/ML injection Inject 0.1 mLs (10 Units total) into the skin 2 (two) times daily before a meal. 10 mL 5 Past Week at Unknown time  . isosorbide mononitrate (IMDUR) 120 MG 24 hr tablet Take 1 tablet (120 mg total) by mouth daily. 30 tablet 0 Past Week at Unknown time  . levothyroxine (SYNTHROID, LEVOTHROID) 50 MCG tablet TAKE 1 TABLET DAILY 90 tablet 3 Past Week at Unknown time  . nitroGLYCERIN (NITROSTAT) 0.4 MG SL tablet PLACE 1 TABLET UNDER THE TONGUE AT ONSET OF CHEST PAIN EVERY 5 MINTUES UP TO 3 TIMES AS NEEDED 25 tablet 0 Past Week at Unknown time  .  pantoprazole (PROTONIX) 40 MG tablet TAKE 1 TABLET DAILY 30 tablet 5 Past Week at Unknown time  . rosuvastatin (CRESTOR) 10 MG tablet Take 10 mg by mouth daily.   Past Week at Unknown time  . torsemide (DEMADEX) 20 MG tablet Take 1 tablet (20 mg total) by mouth 2 (two) times daily. 60 tablet 5 Past Week at Unknown time  . glucose blood (ACCU-CHEK AVIVA PLUS) test strip CHECK BLOOD SUGER UP TO 3 TIMES A DAY 300 each 3 Taking    Assessment: 87 yof with chest pain admitted to stepdown unit for NSTEMI.  Goal of Therapy:  Heparin level 0.3-0.7 units/ml Monitor platelets by anticoagulation protocol: Yes   Plan:  Heparin level: <0.1 IU/ml Give 1850 units bolus x 1 Increase  heparin infusion to 850 units/hr (8.44ml/hr) Check anti-Xa level in 6-8 hours and daily while on heparin Continue to monitor H&H and platelets  Thank You,  Despina Pole 05/19/2018,1:48 PM

## 2018-05-19 NOTE — Progress Notes (Signed)
Pt BP now 108/44 continues on Nitro gtt at 15mcg. Decreased to 126mcg. Notified Dr. Clementeen Graham.

## 2018-05-19 NOTE — ED Notes (Signed)
Spoke with Tomi Bamberger per order keep titration nitro gtt up until bp is 028D systolically

## 2018-05-19 NOTE — ED Notes (Signed)
Date and time results received: 05/19/18 12:22 AM  Test: troponin Critical Value: 0.15  Name of Provider Notified: knapp  MD to see pt

## 2018-05-19 NOTE — ED Provider Notes (Signed)
Patient left at change of shift to get delta troponin results and CT of the abdomen/pelvis result.  Patient states she felt fine when she got up this morning however about 10 PM she started having pain.  We discussed the results of her troponin and her CT scan.  We also discussed need for admission.  Patient states currently her chest discomfort is gone and she still has diffuse abdominal discomfort.  Patient had recent NSTEMI and at that time her family refused to have her be evaluated with cardiac cath.  Patient is pale with yellowish discoloration of her skin.  She does not appear to be in any respiratory distress.  Blood pressure while on the in the room was 186/124 which is much higher than previous blood pressures documented in the chart.  I am going to start her on a nitroglycerin drip.   12:55 AM Dr Manuella Ghazi, hospitalist, will admit. Wants to start heparin and will monitor her Hb. Heparin infusion ordered.   Medications  0.9 %  sodium chloride infusion ( Intravenous New Bag/Given 05/18/18 1947)  nitroGLYCERIN 50 mg in dextrose 5 % 250 mL (0.2 mg/mL) infusion (has no administration in time range)  aspirin tablet 325 mg (has no administration in time range)  heparin ADULT infusion 100 units/mL (25000 units/226mL sodium chloride 0.45%) (has no administration in time range)  ondansetron (ZOFRAN) injection 4 mg (4 mg Intravenous Given 05/18/18 1947)     Results for orders placed or performed during the hospital encounter of 05/18/18  Comprehensive metabolic panel  Result Value Ref Range   Sodium 137 135 - 145 mmol/L   Potassium 5.4 (H) 3.5 - 5.1 mmol/L   Chloride 106 101 - 111 mmol/L   CO2 23 22 - 32 mmol/L   Glucose, Bld 96 65 - 99 mg/dL   BUN 75 (H) 6 - 20 mg/dL   Creatinine, Ser 3.88 (H) 0.44 - 1.00 mg/dL   Calcium 9.0 8.9 - 10.3 mg/dL   Total Protein 5.6 (L) 6.5 - 8.1 g/dL   Albumin 3.0 (L) 3.5 - 5.0 g/dL   AST 12 (L) 15 - 41 U/L   ALT 7 (L) 14 - 54 U/L   Alkaline Phosphatase 38 38 -  126 U/L   Total Bilirubin 0.6 0.3 - 1.2 mg/dL   GFR calc non Af Amer 10 (L) >60 mL/min   GFR calc Af Amer 11 (L) >60 mL/min   Anion gap 8 5 - 15  Lipase, blood  Result Value Ref Range   Lipase 31 11 - 51 U/L  CBC with Differential/Platelet  Result Value Ref Range   WBC 3.7 (L) 4.0 - 10.5 K/uL   RBC 2.61 (L) 3.87 - 5.11 MIL/uL   Hemoglobin 7.6 (L) 12.0 - 15.0 g/dL   HCT 23.7 (L) 36.0 - 46.0 %   MCV 90.8 78.0 - 100.0 fL   MCH 29.1 26.0 - 34.0 pg   MCHC 32.1 30.0 - 36.0 g/dL   RDW 14.2 11.5 - 15.5 %   Platelets 161 150 - 400 K/uL   Neutrophils Relative % 70 %   Neutro Abs 2.6 1.7 - 7.7 K/uL   Lymphocytes Relative 20 %   Lymphs Abs 0.7 0.7 - 4.0 K/uL   Monocytes Relative 7 %   Monocytes Absolute 0.3 0.1 - 1.0 K/uL   Eosinophils Relative 4 %   Eosinophils Absolute 0.1 0.0 - 0.7 K/uL   Basophils Relative 1 %   Basophils Absolute 0.0 0.0 - 0.1 K/uL  Troponin I  Result Value Ref Range   Troponin I 0.05 (HH) <0.03 ng/mL  Troponin I  Result Value Ref Range   Troponin I 0.15 (HH) <0.03 ng/mL    Laboratory interpretation all normal except increasing rise of her troponin, malnutrition, significant anemia since early part of May when she was not anemic, stable renal insufficiency   Ct Abdomen Pelvis Wo Contrast  Result Date: 05/18/2018 CLINICAL DATA:  Umbilical and lower abdominal pain EXAM: CT ABDOMEN AND PELVIS WITHOUT CONTRAST TECHNIQUE: Multidetector CT imaging of the abdomen and pelvis was performed following the standard protocol without IV contrast. COMPARISON:  CT 03/04/2017, 05/14/2013 FINDINGS: Lower chest: Lung bases demonstrate patchy dependent atelectasis. Trace pleural effusions. Dilated pulmonary trunk measuring up to 4.5 cm. Aortic atherosclerosis. Extensive coronary vascular calcification. Mild cardiomegaly. No large pericardial effusion. Hepatobiliary: No focal liver abnormality is seen. No gallstones, gallbladder wall thickening, or biliary dilatation. Pancreas:  Unremarkable. No pancreatic ductal dilatation or surrounding inflammatory changes. Spleen: Normal in size without focal abnormality. Adrenals/Urinary Tract: Adrenal glands are normal. Complex cyst upper pole left kidney with peripheral calcification measuring 18 mm, no change in size but increased calcification. Atrophic kidneys. Probable parapelvic cysts on the left. Bladder normal. Stomach/Bowel: The stomach is nonenlarged. No dilated small bowel. No colon wall thickening. Status post appendectomy. Sigmoid colon diverticular disease without acute inflammation. Moderate feces in the rectum Vascular/Lymphatic: Extensive aortic atherosclerosis. No aneurysmal dilatation. No significantly enlarged lymph nodes. Reproductive: Enlarged uterus for age. Stable 4.6 cm left adnexal mass presumably an exophytic fibroid given lack of significant interval change. Other: Negative for free air or free fluid. Fat in the umbilical region. Musculoskeletal: Grade 1 anterolisthesis L3 on L4 with associated degenerative changes. No acute or suspicious abnormality. Old sternal fracture. IMPRESSION: 1. No definite CT evidence for acute intra-abdominal or pelvic abnormality. 2. Trace pleural effusions. Cardiomegaly. Dilated pulmonary arterial trunk suggesting pulmonary hypertension. 3. Sigmoid colon diverticular disease without acute inflammation 4. Atrophic kidneys 5. Enlarged uterus for age. Similar appearance of probable left exophytic fibroid. Electronically Signed   By: Donavan Foil M.D.   On: 05/18/2018 23:12   Dg Chest 2 View  Result Date: 05/18/2018 CLINICAL DATA:  84 year old female with chest pain since 1400 hours. EXAM: CHEST - 2 VIEW COMPARISON:  Chest radiographs 03/05/2018 and earlier. FINDINGS: Upright AP and lateral views of chest. Stable lung volumes. Possible small pleural effusions. Stable pulmonary vascularity, no acute edema. Stable cardiomegaly and mediastinal contours. Calcified aortic atherosclerosis.  Visualized tracheal air column is within normal limits. No pneumothorax. No acute osseous abnormality identified. Negative visible bowel gas pattern. IMPRESSION: 1. Possible small pleural effusions, not significantly changed since April. No acute cardiopulmonary abnormality. 2. Chronic cardiomegaly, Aortic Atherosclerosis (ICD10-I70.0). Electronically Signed   By: Genevie Ann M.D.   On: 05/18/2018 20:07    EKG Interpretation  Date/Time:  Friday May 18 2018 19:11:41 EDT Ventricular Rate:  51 PR Interval:    QRS Duration: 121 QT Interval:  492 QTC Calculation: 426 R Axis:   -13 Text Interpretation:  Sinus rhythm Prolonged PR interval Left ventricular hypertrophy Anterior Q waves, possibly due to LVH Confirmed by Fredia Sorrow [phone]) on 05/18/2018 7:15:33 PM       CRITICAL CARE Performed by: Kalisi Bevill L Azaria Bartell Total critical care time: 30 minutes Critical care time was exclusive of separately billable procedures and treating other patients. Critical care was necessary to treat or prevent imminent or life-threatening deterioration. Critical care was time spent personally by me on the  following activities: development of treatment plan with patient and/or surrogate as well as nursing, discussions with consultants, evaluation of patient's response to treatment, examination of patient, obtaining history from patient or surrogate, ordering and performing treatments and interventions, ordering and review of laboratory studies, ordering and review of radiographic studies, pulse oximetry and re-evaluation of patient's condition.   Diagnoses that have been ruled out:  None  Diagnoses that are still under consideration:  None  Final diagnoses:  Precordial pain  Anemia, unspecified type  Generalized abdominal pain  Renal insufficiency  NSTEMI (non-ST elevated myocardial infarction) [facility])   Plan admission  Rolland Porter, MD, Barbette Or, MD 05/19/18 [phone]

## 2018-05-19 NOTE — Progress Notes (Signed)
Dr. Clementeen Graham stated it was ok to hold pt's 1600 dose of Coreg due to bradycardia. He also stated to decrease Nitro gtt gradually and then d/c.

## 2018-05-20 DIAGNOSIS — I214 Non-ST elevation (NSTEMI) myocardial infarction: Principal | ICD-10-CM

## 2018-05-20 LAB — GLUCOSE, CAPILLARY
GLUCOSE-CAPILLARY: 139 mg/dL — AB (ref 65–99)
GLUCOSE-CAPILLARY: 194 mg/dL — AB (ref 65–99)
GLUCOSE-CAPILLARY: 213 mg/dL — AB (ref 65–99)
Glucose-Capillary: 285 mg/dL — ABNORMAL HIGH (ref 65–99)
Glucose-Capillary: 262 mg/dL — ABNORMAL HIGH (ref 65–99)

## 2018-05-20 LAB — HEPARIN LEVEL (UNFRACTIONATED): Heparin Unfractionated: 0.31 IU/mL (ref 0.30–0.70)

## 2018-05-20 MED ORDER — MORPHINE SULFATE (PF) 2 MG/ML IV SOLN: 1.0000 mg | INTRAVENOUS | Status: DC | PRN

## 2018-05-20 MED ORDER — CLONIDINE HCL 0.2 MG PO TABS
0.2000 mg | ORAL_TABLET | Freq: Every morning | ORAL | Status: DC
  Administered 2018-05-21 – 2018-05-22 (×2): 0.2 mg via ORAL
  Filled 2018-05-20 (×2): qty 1

## 2018-05-20 NOTE — Progress Notes (Signed)
Dr. Erlinda Hong notified of b/p 94/39 and HR 47-52 and status of NTG drip.

## 2018-05-20 NOTE — Progress Notes (Signed)
PROGRESS NOTE  [name] ZOX:[id number] DOB: 1932-07-03 DOA: 05/18/2018 PCP: Timmothy Euler, MD  HPI/Recap of past 24 hours:  Reported left arm pain, her blood pressure is elevated, she is on room air, no cough, no fever, no edema   Assessment/Plan: Principal Problem:   NSTEMI (non-ST elevated myocardial infarction) ([place]) Active Problems:   Hyperlipidemia   Cerebrovascular disease   HTN (hypertension)   Chronic kidney disease, stage IV (severe) (H[facility])   CAD (coronary artery disease), native coronary artery  NSTEMI (non-ST elevated myocardial infarction) ([place]) -Echocardiogram left ventricular ejection fraction 60%, no wall motion abnormality -Chest pain responded to nitrodrip -continue heparin drip and nitro drip, transition to Ranexa for chest pain control - Continue home dose aspirin, Coreg and statin.   - Declined cardiac cath in the past and insists on not wanting any aggressive measures, agitated on wanting to go home and would like to pursue comfort measures. -She will benefit from palliative care evaluation.  She is at high risk for signing out AMA. Daughter involved in care and once her to get adequate medical treatment.  Hypertensive urgency Continue on nitroglycerin drip and monitor. Patient has tendency of being bradycardia, will try to taper off clonidine, continue Coreg as tolerated Continue titrate hydralazine and Imdur  Chronic diastolic CHF Euvolemic.  Agitation Ordered for PRN Haldol and low-dose Ativan as needed. No agitation today  History of CVA with mild vascular dementia Continue aspirin and statin. oriented to person, know she is in the hospital but not able to state the right name, she is not oriented to time    Chronic kidney disease, stage IV (severe) ([place]) Was evaluated by nephrology in the past and discussed dialysis options.  Appears that patient has refused dialysis.  Insulin-dependent diabetes mellitus Continue home dose  insulin and monitor on sliding scale coverage.   Iron deficiency anemia Continue supplement. hgb close to baseline, no overt bleed   FTT, will benefit from palliative care consult and goals of care discussion on Monday  Code Status: DNR  Family Communication: patient   Disposition Plan: remain in stepdown, pending goals of care   Consultants:  Palliative care  Procedures:  none  Antibiotics:  none   Objective: BP (!) 194/72   Pulse 86   Temp 98 F (36.7 C) (Oral)   Resp 14   Ht 5\' 6"  (1.676 m)   Wt 62.4 kg (137 lb 9.1 oz)   SpO2 100%   BMI 22.20 kg/m   Intake/Output Summary (Last 24 hours) at 05/20/2018 8119 Last data filed at 05/20/2018 0915 Gross per 24 hour  Intake 933.16 ml  Output 300 ml  Net 633.16 ml   Filed Weights   05/19/18 0620 05/20/18 0430  Weight: 61.9 kg (136 lb 7.4 oz) 62.4 kg (137 lb 9.1 oz)    Exam: Patient is examined daily including today on 05/20/2018, exams remain the same as of yesterday except that has changed    General:  Frail elderly female, NAD  Cardiovascular: RRR, + precordial murmur  Respiratory: CTABL  Abdomen: Soft/ND/NT, positive BS  Musculoskeletal: No Edema  Neuro: alert, oriented to person, know she is in the hospital but not able to state the right name, she is not oriented to time  Data Reviewed: Basic Metabolic Panel: Recent Labs  Lab 05/18/18 1939 05/19/18 1053  NA 137 139  K 5.4* 4.5  CL 106 107  CO2 23 22  GLUCOSE 96 122*  BUN 75* 74*  CREATININE 3.88*  3.81*  CALCIUM 9.0 9.2   Liver Function Tests: Recent Labs  Lab 05/18/18 1939  AST 12*  ALT 7*  ALKPHOS 38  BILITOT 0.6  PROT 5.6*  ALBUMIN 3.0*   Recent Labs  Lab 05/18/18 1939  LIPASE 31   No results for input(s): AMMONIA in the last 168 hours. CBC: Recent Labs  Lab 05/18/18 1939 05/19/18 1053  WBC 3.7* 4.6  NEUTROABS 2.6  --   HGB 7.6* 7.8*  HCT 23.7* 23.9*  MCV 90.8 90.2  PLT 161 155   Cardiac Enzymes:     Recent Labs  Lab 05/18/18 1939 05/18/18 2324 05/19/18 1053 05/19/18 1837  TROPONINI 0.05* 0.15* 0.13* 0.13*   BNP (last 3 results) Recent Labs    09/20/17 1940  BNP 1,243.0*    ProBNP (last 3 results) No results for input(s): PROBNP in the last 8760 hours.  CBG: Recent Labs  Lab 05/19/18 2133 05/20/18 0731  GLUCAP 96 139*    Recent Results (from the past 240 hour(s))  MRSA PCR Screening     Status: None   Collection Time: 05/19/18  6:22 AM  Result Value Ref Range Status   MRSA by PCR NEGATIVE NEGATIVE Final    Comment:        The GeneXpert MRSA Assay (FDA approved for NASAL specimens only), is one component of a comprehensive MRSA colonization surveillance program. It is not intended to diagnose MRSA infection nor to guide or monitor treatment for MRSA infections. Performed at [facility], [address] [place], [place] [address]      Studies: No results found.  Scheduled Meds: . aspirin EC  81 mg Oral Daily  . carvedilol  12.5 mg Oral BID WC  . cholecalciferol  1,000 Units Oral Daily  . cloNIDine  0.2 mg Oral QHS  . cloNIDine  0.4 mg Oral q morning - 10a  . clopidogrel  75 mg Oral Daily  . hydrALAZINE  50 mg Oral Q8H  . insulin glargine  10 Units Subcutaneous QHS  . insulin NPH Human  10 Units Subcutaneous QAC breakfast  . insulin NPH Human  10 Units Subcutaneous QHS  . isosorbide mononitrate  120 mg Oral Daily  . levothyroxine  50 mcg Oral QAC breakfast  . pantoprazole  40 mg Oral Daily  . rosuvastatin  5 mg Oral Daily  . sodium chloride flush  3 mL Intravenous Q12H  . torsemide  20 mg Oral BID    Continuous Infusions: . sodium chloride    . heparin 850 Units/hr (05/20/18 0459)  . nitroGLYCERIN 130 mcg/min (05/20/18 0925)     Time spent: 57mins I have personally reviewed and interpreted on  05/20/2018 daily labs, tele strips, imagings as discussed above under date review session and assessment and plans.  I reviewed all nursing notes,  pharmacy notes,  vitals, pertinent old records  I have discussed plan of care as described above with RN , patient on 05/20/2018   Florencia Reasons MD, PhD  [facility] Pager [phone]. If 7PM-7AM, please contact night-coverage at [email or web address], password [email or web address] 05/20/2018, 9:28 AM  LOS: 1 day

## 2018-05-20 NOTE — Progress Notes (Addendum)
ANTICOAGULATION CONSULT NOTE - Follow Up Consult  Pharmacy Consult for : heparin dosing Indication: atrial fibrillation, NSTEMI Plates: 155k   Patient Measurements: Height: 5\' 6"  (167.6 cm) Weight: 137 lb 9.1 oz (62.4 kg) IBW/kg (Calculated) : 59.3 Heparin Dosing Weight: HEPARIN DW (KG): 61.9  Vital Signs: Temp: 98 F (36.7 C) (06/23 0732) Temp Source: Oral (06/23 0732) BP: 194/72 (06/23 0913) Pulse Rate: 86 (06/23 0820)  Labs: Recent Labs    05/18/18 1939 05/18/18 2324 05/19/18 0124 05/19/18 1053 05/19/18 1837 05/20/18 0447  HGB 7.6*  --   --  7.8*  --   --   HCT 23.7*  --   --  23.9*  --   --   PLT 161  --   --  155  --   --   APTT  --   --  29  --   --   --   LABPROT  --   --  13.8  --   --   --   INR  --   --  1.07  --   --   --   HEPARINUNFRC  --   --   --  <0.10* 0.56 0.31  CREATININE 3.88*  --   --  3.81*  --   --   TROPONINI 0.05* 0.15*  --  0.13* 0.13*  --     Estimated Creatinine Clearance: 10.5 mL/min (A) (by C-G formula based on SCr of 3.81 mg/dL (H)).    Assessment: Heparin level: 0.31 IU/mL (just slightly in goal range)  Goal of Therapy:  Heparin level 0.3-0.7 units/ml Monitor platelets by anticoagulation protocol: Yes   Plan:  Increase heparin infusion to 900units/hr Check anti-Xa level daily while on heparin Continue to monitor H&H and platelets  Despina Pole 05/20/2018,9:52 AM

## 2018-05-21 ENCOUNTER — Encounter (HOSPITAL_COMMUNITY): Payer: Self-pay | Admitting: Primary Care

## 2018-05-21 DIAGNOSIS — Z7189 Other specified counseling: Secondary | ICD-10-CM

## 2018-05-21 DIAGNOSIS — Z515 Encounter for palliative care: Secondary | ICD-10-CM

## 2018-05-21 DIAGNOSIS — D508 Other iron deficiency anemias: Secondary | ICD-10-CM

## 2018-05-21 LAB — CBC
HCT: 19.9 % — ABNORMAL LOW (ref 36.0–46.0)
HEMOGLOBIN: 6.4 g/dL — AB (ref 12.0–15.0)
MCH: 29.1 pg (ref 26.0–34.0)
MCHC: 32.2 g/dL (ref 30.0–36.0)
MCV: 90.5 fL (ref 78.0–100.0)
PLATELETS: 151 10*3/uL (ref 150–400)
RBC: 2.2 MIL/uL — ABNORMAL LOW (ref 3.87–5.11)
RDW: 14.3 % (ref 11.5–15.5)
WBC: 3.9 10*3/uL — ABNORMAL LOW (ref 4.0–10.5)

## 2018-05-21 LAB — GLUCOSE, CAPILLARY
GLUCOSE-CAPILLARY: 131 mg/dL — AB (ref 65–99)
GLUCOSE-CAPILLARY: 103 mg/dL — AB (ref 65–99)
GLUCOSE-CAPILLARY: 64 mg/dL — AB (ref 65–99)
GLUCOSE-CAPILLARY: 75 mg/dL (ref 65–99)
Glucose-Capillary: 97 mg/dL (ref 65–99)

## 2018-05-21 LAB — PREPARE RBC (CROSSMATCH)

## 2018-05-21 LAB — HEPARIN LEVEL (UNFRACTIONATED): HEPARIN UNFRACTIONATED: 0.45 [IU]/mL (ref 0.30–0.70)

## 2018-05-21 MED ORDER — SODIUM CHLORIDE 0.9% IV SOLUTION
Freq: Once | INTRAVENOUS | Status: AC
  Administered 2018-05-21: 10:00:00 via INTRAVENOUS

## 2018-05-21 MED ORDER — CARVEDILOL 3.125 MG PO TABS
6.2500 mg | ORAL_TABLET | Freq: Two times a day (BID) | ORAL | Status: DC
  Administered 2018-05-21 – 2018-05-22 (×2): 6.25 mg via ORAL
  Filled 2018-05-21 (×2): qty 2

## 2018-05-21 NOTE — Progress Notes (Signed)
Pt very agitated-refusing to let RN and NT change sheets. States "I'm cold". Very upset she was not allowed to go home today and stated her daughter told her the doctor told the daughter NO. Pt keeps stating she keeps ringing the call bell and no one is coming. RN adv pt that she did not ring the call bell-pt states " Well I did this morning and no one came." RN adv pt that this RN and NT were not here this morning and can only control what happens now. Pt stated she peed in the bed twice because she knew no one would come. RN and NT placed purewick on pt- changed sheets and gown, peri care provided. Will continue to monitor pt

## 2018-05-21 NOTE — Consult Note (Signed)
Consultation Note Date: 05/21/2018   Patient Name: [name]  DOB: Dec 06, 1931  MRN: [id number]  Age / Sex: 84 y.o., female  PCP: Timmothy Euler, MD Referring Physician: Louellen Molder, MD  Reason for Consultation: Establishing goals of care and Psychosocial/spiritual support  HPI/Patient Profile: 84 y.o. female  with past medical history of stage IV kidney disease, high blood pressure and cholesterol, CVA, coronary artery disease, insulin-dependent diabetes, what seems to be early stages of dementia, prior MI declines aggressive intervention such as cardiac cath admitted on 05/18/2018 with NSTEMI.   Clinical Assessment and Goals of Care: [name] is lying quietly in bed.  She will open her eyes and briefly looking my direction when I touch her and speak to her.  She appears relatively frail and weak.  Although she has known dementia, she is oriented to person and place, not asked about time.  She has no complaints at this time.  No family at bedside at this time.  Call to son, Cassidie Veiga.  He tells me that [name] lives with her daughter [name] in [place] home.  He states that [facility] Mrs. [place] health concerns and takes care of her.  Call to daughter, [name] at [phone], voicemail full, unable to leave message.  Call to [place] at [phone].  Spoke in detail with Raychel's sister, Ileene Rubens.  Vaughan Basta states that she is in town to help with [name]'s care until Wednesday.  Vaughan Basta said the goal is for Addalyn to return to [facility], they have someone that is scheduled to come in and care for Ms. Hundal while [name] is at work.  Vaughan Basta shares that Jinelle "throws a fit" when people mention going to a nursing home.  I share with Vaughan Basta that I believe the best choices for [name] to return to [place] home with support.  We talked about hospice services in home.  I  share that hospice is 100% free in [name]'s home.  Vaughan Basta states that her sister could pass/have a heart attack at any time.  I shared that this would not be surprising based on her heart history and current MI.  We talked also about her anemia chronic disease, low hemoglobin.  I shared that hospice is an extra layer of support.  Vaughan Basta states that she will reach out to [place] who was at work at this time and ask her to call.  No return phone call from daughter, will attempt to contact again tomorrow.  Conversation with hospitalist related to plan of care.  Healthcare power of attorney NEXT OF KIN - No legal HCPOA, daughter [name] is main caregiver.     SUMMARY OF RECOMMENDATIONS   24- 48 hours for outcomes, goal is return to home.   Code Status/Advance Care Planning:  DNR  Symptom Management:   Per hospitalist, no additional needs at this time.  Palliative Prophylaxis:   Aspiration, Frequent Pain Assessment and Turn Reposition  Additional Recommendations (Limitations, Scope, Preferences):  Treat the treatable but no extraordinary  measures such as CPR or intubation, no cardiac cath.  Psycho-social/Spiritual:   Desire for further Chaplaincy support:no  Additional Recommendations: Caregiving  Support/Resources and Education on Hospice  Prognosis:   < 6 months, would not be surprising based on NSTEMI requesting medical management only, CAD, anemia of chronic disease with Hgb of 6.4 requiring transfusion.   Discharge Planning: To be determined, home, question with hospice.      Primary Diagnoses: Present on Admission: . NSTEMI (non-ST elevated myocardial infarction) ([place]) . CAD (coronary artery disease), native coronary artery . Cerebrovascular disease . HTN (hypertension) . Hyperlipidemia . Chronic kidney disease, stage IV (severe) ([place])   I have reviewed the medical record, interviewed the patient and family, and examined the patient. The following aspects  are pertinent.  Past Medical History:  Diagnosis Date  . CAD (coronary artery disease) 2003   Last catheterization 2008. Two-vessel PCI of the first OM branch and mid LAD.  Marland Kitchen CKD (chronic kidney disease)   . Congestive heart failure ([place]) 02/2017   grade 2 diastolic dysfunction  . CVA (cerebral vascular accident) ([place]) [place]   . Dementia   . DJD (degenerative joint disease) of lumbar spine   . Hypercholesteremia   . Hypertension   . IDDM (insulin dependent diabetes mellitus) ([place])    Social History   Socioeconomic History  . Marital status: Widowed    Spouse name: Not on file  . Number of children: Not on file  . Years of education: Not on file  . Highest education level: Not on file  Occupational History  . Occupation: Retired  Scientific laboratory technician  . Financial resource strain: Not on file  . Food insecurity:    Worry: Not on file    Inability: Not on file  . Transportation needs:    Medical: Not on file    Non-medical: Not on file  Tobacco Use  . Smoking status: Never Smoker  . Smokeless tobacco: Never Used  Substance and Sexual Activity  . Alcohol use: No  . Drug use: No  . Sexual activity: Never  Lifestyle  . Physical activity:    Days per week: Not on file    Minutes per session: Not on file  . Stress: Not on file  Relationships  . Social connections:    Talks on phone: Not on file    Gets together: Not on file    Attends religious service: Not on file    Active member of club or organization: Not on file    Attends meetings of clubs or organizations: Not on file    Relationship status: Not on file  Other Topics Concern  . Not on file  Social History Narrative      Lives with daughter    Family History  Problem Relation Age of Onset  . Heart attack Mother 35  . Heart attack Father 7  . Diabetes Brother        RETINOPATHY   . Drug abuse Brother   . Liver cancer Brother   . Breast cancer Daughter   . Diabetes Sister    Scheduled Meds: .  aspirin EC  81 mg Oral Daily  . carvedilol  6.25 mg Oral BID WC  . cholecalciferol  1,000 Units Oral Daily  . cloNIDine  0.2 mg Oral QHS  . cloNIDine  0.2 mg Oral q morning - 10a  . clopidogrel  75 mg Oral Daily  . hydrALAZINE  50 mg Oral Q8H  .  insulin glargine  10 Units Subcutaneous QHS  . insulin NPH Human  10 Units Subcutaneous QAC breakfast  . insulin NPH Human  10 Units Subcutaneous QHS  . isosorbide mononitrate  120 mg Oral Daily  . levothyroxine  50 mcg Oral QAC breakfast  . pantoprazole  40 mg Oral Daily  . rosuvastatin  5 mg Oral Daily  . sodium chloride flush  3 mL Intravenous Q12H  . torsemide  20 mg Oral BID   Continuous Infusions: . sodium chloride 250 mL (05/21/18 1134)   PRN Meds:.sodium chloride, acetaminophen **OR** acetaminophen, diclofenac sodium, haloperidol lactate, HYDROcodone-acetaminophen, LORazepam, morphine injection, ondansetron **OR** ondansetron (ZOFRAN) IV, sodium chloride flush Medications Prior to Admission:  Prior to Admission medications   Medication Sig Start Date End Date Taking? Authorizing Provider  aspirin EC 81 MG EC tablet Take 1 tablet (81 mg total) by mouth daily. 10/08/16  Yes Bhagat, Bhavinkumar, PA  carvedilol (COREG) 12.5 MG tablet TAKE  (1)  TABLET TWICE A DAY. 05/01/18  Yes Timmothy Euler, MD  cholecalciferol (VITAMIN D) 1000 units tablet Take 1,000 Units by mouth daily.   Yes [provider]  cloNIDine (CATAPRES) 0.2 MG tablet Take 0.2 mg by mouth 3 (three) times daily.  01/30/18  Yes [provider]  clopidogrel (PLAVIX) 75 MG tablet TAKE 1 TABLET DAILY 03/08/18  Yes Timmothy Euler, MD  hydrALAZINE (APRESOLINE) 50 MG tablet Take 1 tablet (50 mg total) by mouth every 8 (eight) hours. 04/26/18 05/26/18 Yes Timmothy Euler, MD  insulin NPH Human (NOVOLIN N) 100 UNIT/ML injection Inject 0.1 mLs (10 Units total) into the skin 2 (two) times daily before a meal. 03/01/18  Yes Timmothy Euler, MD  isosorbide mononitrate  (IMDUR) 120 MG 24 hr tablet Take 1 tablet (120 mg total) by mouth daily. 11/27/17  Yes Charlynne Cousins, MD  levothyroxine (SYNTHROID, LEVOTHROID) 50 MCG tablet TAKE 1 TABLET DAILY 04/03/17  Yes Timmothy Euler, MD  nitroGLYCERIN (NITROSTAT) 0.4 MG SL tablet PLACE 1 TABLET UNDER THE TONGUE AT ONSET OF CHEST PAIN EVERY 5 MINTUES UP TO 3 TIMES AS NEEDED 05/01/18  Yes Timmothy Euler, MD  pantoprazole (PROTONIX) 40 MG tablet TAKE 1 TABLET DAILY 01/15/18  Yes Timmothy Euler, MD  rosuvastatin (CRESTOR) 10 MG tablet Take 10 mg by mouth daily.   Yes [provider]  torsemide (DEMADEX) 20 MG tablet Take 1 tablet (20 mg total) by mouth 2 (two) times daily. 01/15/18  Yes Timmothy Euler, MD  glucose blood (ACCU-CHEK AVIVA PLUS) test strip CHECK BLOOD SUGER UP TO 3 TIMES A DAY 01/02/18   Timmothy Euler, MD   Allergies  Allergen Reactions  . Lovastatin   . Propoxyphene N-Acetaminophen     Pt does not know reaction  . Simvastatin Other (See Comments)    headache   Review of Systems  Unable to perform ROS: Acuity of condition    Physical Exam  Constitutional: She is oriented to person, place, and time. She appears ill.  HENT:  Head: Atraumatic.  Cardiovascular: Regular rhythm.  Pulmonary/Chest: Effort normal. No respiratory distress.  Abdominal: Soft. She exhibits no distension.  Musculoskeletal:       Right lower leg: She exhibits no edema.       Left lower leg: She exhibits no edema.  Neurological: She is alert and oriented to person, place, and time.  Skin: Skin is warm and dry.  Psychiatric: Her mood appears not anxious. She is not agitated.  Nursing note and vitals reviewed.   Vital Signs: BP (!) 105/43   Pulse (!) 48   Temp 97.6 F (36.4 C) (Oral)   Resp 11   Ht 5\' 6"  (1.676 m)   Wt 64.4 kg (141 lb 15.6 oz)   SpO2 100%   BMI 22.92 kg/m  Pain Scale: 0-10   Pain Score: 0-No pain   SpO2: SpO2: 100 % O2 Device:SpO2: 100 % O2 Flow Rate: .   IO:  Intake/output summary:   Intake/Output Summary (Last 24 hours) at 05/21/2018 1349 Last data filed at 05/21/2018 0531 Gross per 24 hour  Intake -  Output 750 ml  Net -750 ml    LBM: Last BM Date: 05/19/18 Baseline Weight: Weight: 61.9 kg (136 lb 7.4 oz) Most recent weight: Weight: 64.4 kg (141 lb 15.6 oz)     Palliative Assessment/Data:   Flowsheet Rows     Most Recent Value  Intake Tab  Referral Department  Hospitalist  Unit at Time of Referral  Intermediate Care Unit  Palliative Care Primary Diagnosis  Cardiac  Date Notified  05/20/18  Palliative Care Type  New Palliative care  Reason for referral  Clarify Goals of Care  Date of Admission  05/18/18  Date first seen by Palliative Care  05/21/18  # of days Palliative referral response time  1 Day(s)  # of days IP prior to Palliative referral  2  Clinical Assessment  Palliative Performance Scale Score  40%  Pain Max last 24 hours  Not able to report  Pain Min Last 24 hours  Not able to report  Dyspnea Max Last 24 Hours  Not able to report  Dyspnea Min Last 24 hours  Not able to report  Psychosocial & Spiritual Assessment  Palliative Care Outcomes  Patient/Family meeting held?  Yes  Who was at the meeting?  pt at bedside, son via phone  Palliative Care Outcomes  Clarified goals of care, Provided advance care planning  Patient/Family wishes: Interventions discontinued/not started   Mechanical Ventilation      Time In: 1300 Time Out: 1350 Time Total: 50 minutes Greater than 50%  of this time was spent counseling and coordinating care related to the above assessment and plan.  Signed by: Drue Novel, NP   Please contact Palliative Medicine Team phone at [phone] for questions and concerns.  For individual provider: See Shea Evans

## 2018-05-21 NOTE — Progress Notes (Signed)
PROGRESS NOTE                                                                                                                                                                                                             Patient Demographics:    [name], is a 84 y.o. female, DOB - Feb 13, 1932, BUL:[id number]  Admit date - 05/18/2018   Admitting Physician Pratik Darleen Crocker, DO  Outpatient Primary MD for the patient is Timmothy Euler, MD  LOS - 2  Outpatient Specialists: none  Chief Complaint  Patient presents with  . Chest Pain       Brief Narrative   84 year old female with chronic kidney disease stage IV, insulin-dependent diabetes mellitus, hypertension, dyslipidemia, dementia, history of CVA, DM, CAD with history of MI and refused aggressive intervention including cardiac cath in the past was brought by EMS for chest pain on the afternoon of admission.  She took 3 nitroglycerin at home with minimal relief and also took a full dose of aspirin.  Reported pain to be pressure-like in the substernal area without any radiation. She was found to have elevated blood pressure readings and started on nitroglycerin drip with some improvement.  She was found to have elevated troponin initially of 0.05 then subsequent of 0.15 and was started on heparin drip.  Other labs showed potassium of 5.4.  CT of the abdomen and pelvis without contrast showed no acute findings. Patient admitted to stepdown unit for NSTEMI.     Subjective:   Patient not agitated but still insisting on wanting to go home.  Blood pressure fluctuating from highs to lows.  Hemoglobin dropped to 6.4 and heparin drip discontinued.  No signs of GI bleed. Denies further chest pain.   Assessment  & Plan :  Principal Problem:   NSTEMI (non-ST elevated myocardial infarction) ([place])  Placed on IV heparin, discontinued due to dropping H&H.  2D echo with normal EF and no  wall motion abnormality.  Was on nitroglycerin drip for hypertensive urgency, now discontinued.  Declined cardiac cath in the past and insists on not wanting any aggressive measures, agitated on wanting to go home and would like to pursue comfort measures. Relative care involved in goals of care discussion.  Was unable to reach her daughter on the phone.  Patient wants to go home and declines  any aggressive treatment.    Active Problems: Hypertensive urgency Fluctuating blood pressure of highs and lows.  Discontinue nitroglycerin drip.  Also had episodes of heart rate going into the high 40s.  Reduce Coreg dose.  Continue Imdur and clonidine.  Iron deficiency anemia with drop in hemoglobin. Hemoglobin dropped to 6.4, baseline around 7-8.  No signs of GI bleed.  Transfuse 1 unit PRBC.  IV heparin drip discontinued.  History of CVA with mild vascular dementia Continue aspirin, Plavix and statin.  Monitor with low H&H.    Chronic kidney disease, stage IV (severe) ([facility]) Was evaluated by nephrology in the past and discussed dialysis options.  Appears that patient has refused dialysis.  Insulin-dependent diabetes mellitus Continue home dose insulin and monitor on sliding scale coverage.  Chronic diastolic CHF Euvolemic.  Iron deficiency anemia Continue supplement.   Code Status : DNR, long-term prognosis is guarded   Family Communication  :  None at bedside  Disposition Plan  : Home possibly in the next 24-48 hours if blood pressure remains stable, improved hemoglobin and final goals of care decision.  Barriers For Discharge : Active symptoms  Consults  : None  Procedures 2D echo, CT of the abdomen     Lab Results  Component Value Date   PLT 151 05/21/2018    Antibiotics  :    Anti-infectives (From admission, onward)   None        Objective:   Vitals:   05/21/18 1115 05/21/18 1130 05/21/18 1145 05/21/18 1406  BP: (!) 122/44 (!) 105/39 (!) 105/43  (!) 158/63  Pulse: (!) 58 (!) 52 (!) 48   Resp: 12 13 11    Temp: 98.1 F (36.7 C)  97.6 F (36.4 C)   TempSrc: Oral  Oral   SpO2: 100% 100% 100%   Weight:      Height:        Wt Readings from Last 3 Encounters:  05/21/18 64.4 kg (141 lb 15.6 oz)  04/26/18 54.9 kg (121 lb)  04/02/18 62.8 kg (138 lb 6.4 oz)     Intake/Output Summary (Last 24 hours) at 05/21/2018 1609 Last data filed at 05/21/2018 0531 Gross per 24 hour  Intake -  Output 750 ml  Net -750 ml     Physical Exam  Gen: not in distress HEENT: Aller present, moist mucosa, supple neck Chest: clear b/l, no added sounds CVS: N S1&S2, no murmurs, rubs or gallop GI: soft, NT, ND, BS+ Musculoskeletal: warm, no edema     Data Review:    CBC Recent Labs  Lab 05/18/18 1939 05/19/18 1053 05/21/18 0516  WBC 3.7* 4.6 3.9*  HGB 7.6* 7.8* 6.4*  HCT 23.7* 23.9* 19.9*  PLT 161 155 151  MCV 90.8 90.2 90.5  MCH 29.1 29.4 29.1  MCHC 32.1 32.6 32.2  RDW 14.2 14.3 14.3  LYMPHSABS 0.7  --   --   MONOABS 0.3  --   --   EOSABS 0.1  --   --   BASOSABS 0.0  --   --     Chemistries  Recent Labs  Lab 05/18/18 1939 05/19/18 1053  NA 137 139  K 5.4* 4.5  CL 106 107  CO2 23 22  GLUCOSE 96 122*  BUN 75* 74*  CREATININE 3.88* 3.81*  CALCIUM 9.0 9.2  AST 12*  --   ALT 7*  --   ALKPHOS 38  --   BILITOT 0.6  --    ------------------------------------------------------------------------------------------------------------------ No results for input(s):  CHOL, HDL, LDLCALC, TRIG, CHOLHDL, LDLDIRECT in the last 72 hours.  Lab Results  Component Value Date   HGBA1C 6.6 (H) 12/24/2017   ------------------------------------------------------------------------------------------------------------------ No results for input(s): TSH, T4TOTAL, T3FREE, THYROIDAB in the last 72 hours.  Invalid input(s):  FREET3 ------------------------------------------------------------------------------------------------------------------ No results for input(s): VITAMINB12, FOLATE, FERRITIN, TIBC, IRON, RETICCTPCT in the last 72 hours.  Coagulation profile Recent Labs  Lab 05/19/18 0124  INR 1.07    No results for input(s): DDIMER in the last 72 hours.  Cardiac Enzymes Recent Labs  Lab 05/18/18 2324 05/19/18 1053 05/19/18 1837  TROPONINI 0.15* 0.13* 0.13*   ------------------------------------------------------------------------------------------------------------------    Component Value Date/Time   BNP 1,243.0 (H) 09/20/2017 1940    Inpatient Medications  Scheduled Meds: . aspirin EC  81 mg Oral Daily  . carvedilol  6.25 mg Oral BID WC  . cholecalciferol  1,000 Units Oral Daily  . cloNIDine  0.2 mg Oral QHS  . cloNIDine  0.2 mg Oral q morning - 10a  . clopidogrel  75 mg Oral Daily  . hydrALAZINE  50 mg Oral Q8H  . insulin glargine  10 Units Subcutaneous QHS  . insulin NPH Human  10 Units Subcutaneous QAC breakfast  . insulin NPH Human  10 Units Subcutaneous QHS  . isosorbide mononitrate  120 mg Oral Daily  . levothyroxine  50 mcg Oral QAC breakfast  . pantoprazole  40 mg Oral Daily  . rosuvastatin  5 mg Oral Daily  . sodium chloride flush  3 mL Intravenous Q12H  . torsemide  20 mg Oral BID   Continuous Infusions: . sodium chloride 250 mL (05/21/18 1134)   PRN Meds:.sodium chloride, acetaminophen **OR** acetaminophen, diclofenac sodium, haloperidol lactate, HYDROcodone-acetaminophen, LORazepam, morphine injection, ondansetron **OR** ondansetron (ZOFRAN) IV, sodium chloride flush  Micro Results Recent Results (from the past 240 hour(s))  MRSA PCR Screening     Status: None   Collection Time: 05/19/18  6:22 AM  Result Value Ref Range Status   MRSA by PCR NEGATIVE NEGATIVE Final    Comment:        The GeneXpert MRSA Assay (FDA approved for NASAL specimens only), is one  component of a comprehensive MRSA colonization surveillance program. It is not intended to diagnose MRSA infection nor to guide or monitor treatment for MRSA infections. Performed at [facility], [address], [place], [place] [address]     Radiology Reports Ct Abdomen Pelvis Wo Contrast  Result Date: 05/18/2018 CLINICAL DATA:  Umbilical and lower abdominal pain EXAM: CT ABDOMEN AND PELVIS WITHOUT CONTRAST TECHNIQUE: Multidetector CT imaging of the abdomen and pelvis was performed following the standard protocol without IV contrast. COMPARISON:  CT 03/04/2017, 05/14/2013 FINDINGS: Lower chest: Lung bases demonstrate patchy dependent atelectasis. Trace pleural effusions. Dilated pulmonary trunk measuring up to 4.5 cm. Aortic atherosclerosis. Extensive coronary vascular calcification. Mild cardiomegaly. No large pericardial effusion. Hepatobiliary: No focal liver abnormality is seen. No gallstones, gallbladder wall thickening, or biliary dilatation. Pancreas: Unremarkable. No pancreatic ductal dilatation or surrounding inflammatory changes. Spleen: Normal in size without focal abnormality. Adrenals/Urinary Tract: Adrenal glands are normal. Complex cyst upper pole left kidney with peripheral calcification measuring 18 mm, no change in size but increased calcification. Atrophic kidneys. Probable parapelvic cysts on the left. Bladder normal. Stomach/Bowel: The stomach is nonenlarged. No dilated small bowel. No colon wall thickening. Status post appendectomy. Sigmoid colon diverticular disease without acute inflammation. Moderate feces in the rectum Vascular/Lymphatic: Extensive aortic atherosclerosis. No aneurysmal dilatation. No significantly enlarged lymph nodes. Reproductive:  Enlarged uterus for age. Stable 4.6 cm left adnexal mass presumably an exophytic fibroid given lack of significant interval change. Other: Negative for free air or free fluid. Fat in the umbilical region. Musculoskeletal: Grade 1  anterolisthesis L3 on L4 with associated degenerative changes. No acute or suspicious abnormality. Old sternal fracture. IMPRESSION: 1. No definite CT evidence for acute intra-abdominal or pelvic abnormality. 2. Trace pleural effusions. Cardiomegaly. Dilated pulmonary arterial trunk suggesting pulmonary hypertension. 3. Sigmoid colon diverticular disease without acute inflammation 4. Atrophic kidneys 5. Enlarged uterus for age. Similar appearance of probable left exophytic fibroid. Electronically Signed   By: Donavan Foil M.D.   On: 05/18/2018 23:12   Dg Chest 2 View  Result Date: 05/18/2018 CLINICAL DATA:  84 year old female with chest pain since 1400 hours. EXAM: CHEST - 2 VIEW COMPARISON:  Chest radiographs 03/05/2018 and earlier. FINDINGS: Upright AP and lateral views of chest. Stable lung volumes. Possible small pleural effusions. Stable pulmonary vascularity, no acute edema. Stable cardiomegaly and mediastinal contours. Calcified aortic atherosclerosis. Visualized tracheal air column is within normal limits. No pneumothorax. No acute osseous abnormality identified. Negative visible bowel gas pattern. IMPRESSION: 1. Possible small pleural effusions, not significantly changed since April. No acute cardiopulmonary abnormality. 2. Chronic cardiomegaly, Aortic Atherosclerosis (ICD10-I70.0). Electronically Signed   By: Genevie Ann M.D.   On: 05/18/2018 20:07    Time Spent in minutes  25   Dam Ashraf M.D on 05/21/2018 at 4:09 PM  Between 7am to 7pm - Pager - [phone]  After 7pm go to [email or web address] - password [facility]  [facility] -  Office  [phone]

## 2018-05-21 NOTE — Progress Notes (Signed)
Inpatient Diabetes Program Recommendations  AACE/ADA: New Consensus Statement on Inpatient Glycemic Control (2019)  Target Ranges:  Prepandial:   less than 140 mg/dL      Peak postprandial:   less than 180 mg/dL (1-2 hours)      Critically ill patients:  140 - 180 mg/dL   Results for [name], [name] (MRN [id number]) as of 05/21/2018 08:05  Ref. Range 05/20/2018 07:31 05/20/2018 11:43 05/20/2018 16:16 05/20/2018 19:58 05/20/2018 23:00  Glucose-Capillary Latest Ref Range: 65 - 99 mg/dL 139 (H) 194 (H) 213 (H) 285 (H) 262 (H)  Results for [name], [name] (MRN [id number]) as of 05/21/2018 08:05  Ref. Range 09/21/2017 04:24 12/24/2017 05:56  Hemoglobin A1C Latest Ref Range: 4.8 - 5.6 % 5.7 (H) 6.6 (H)   Review of Glycemic Control  Diabetes history: DM2 Outpatient Diabetes medications: NPH 10 units BID (per home med list, if CBG less than 100 mg/dl in the mornings, insulin is not administered; pt's daughter reports that she sometimes exceeds 10 units for evening doses due to elevated blood sugar levels) Current orders for Inpatient glycemic control: NPH 10 units BID, Lantus 10 units QHS  Inpatient Diabetes Program Recommendations: Insulin - Basal: May want to consider discontinug Lantus. Correction (SSI): Please consider ordering CBGs with Novolog 0-9 units TID with meals and Novolog 0-5 units QHS.  NOTE: In reviewing chart, noted patient has NPH 10 units BID on home med list for DM control as an outpatient. Also noted PCP office visit note on 03/29/18 "Using NPH once daily, usually gets second shot as well; Avg fasting CBG is 150-180; She is working with the pharmacist to try to get her basaglar. Trying to change to basaglar from NPH due to compliance, [facility] pharmacist has been working through the process."    Thanks, Barnie Alderman, RN, MSN, CDE Diabetes Coordinator Inpatient Diabetes Program [phone] (Team Pager from 8am to 5pm)

## 2018-05-21 NOTE — Progress Notes (Signed)
Dr Clementeen Graham notified of hgb 6.4. Verbal order to stop heparin gtt.

## 2018-05-22 DIAGNOSIS — Z794 Long term (current) use of insulin: Secondary | ICD-10-CM

## 2018-05-22 DIAGNOSIS — E11649 Type 2 diabetes mellitus with hypoglycemia without coma: Secondary | ICD-10-CM

## 2018-05-22 DIAGNOSIS — D649 Anemia, unspecified: Secondary | ICD-10-CM

## 2018-05-22 DIAGNOSIS — Z515 Encounter for palliative care: Secondary | ICD-10-CM

## 2018-05-22 DIAGNOSIS — N184 Chronic kidney disease, stage 4 (severe): Secondary | ICD-10-CM

## 2018-05-22 LAB — BASIC METABOLIC PANEL
Anion gap: 8 (ref 5–15)
BUN: 70 mg/dL — AB (ref 8–23)
CHLORIDE: 104 mmol/L (ref 98–111)
CO2: 25 mmol/L (ref 22–32)
Calcium: 9.2 mg/dL (ref 8.9–10.3)
Creatinine, Ser: 3.81 mg/dL — ABNORMAL HIGH (ref 0.44–1.00)
GFR calc Af Amer: 12 mL/min — ABNORMAL LOW (ref >60)
GFR, EST NON AFRICAN AMERICAN: 10 mL/min — AB (ref >60)
GLUCOSE: 37 mg/dL — AB (ref 70–99)
POTASSIUM: 4.2 mmol/L (ref 3.5–5.1)
Sodium: 137 mmol/L (ref 135–145)

## 2018-05-22 LAB — BPAM RBC
Blood Product Expiration Date: 201907162359
ISSUE DATE / TIME: 201906241119
Unit Type and Rh: 6200

## 2018-05-22 LAB — HEMOGLOBIN AND HEMATOCRIT, BLOOD
HEMATOCRIT: 25.3 % — AB (ref 36.0–46.0)
Hemoglobin: 8.3 g/dL — ABNORMAL LOW (ref 12.0–15.0)

## 2018-05-22 LAB — TYPE AND SCREEN
ABO/RH(D): A POS
Antibody Screen: NEGATIVE
Unit division: 0

## 2018-05-22 LAB — GLUCOSE, CAPILLARY
GLUCOSE-CAPILLARY: 113 mg/dL — AB (ref 70–99)
GLUCOSE-CAPILLARY: 36 mg/dL — AB (ref 70–99)
GLUCOSE-CAPILLARY: 52 mg/dL — AB (ref 70–99)
Glucose-Capillary: 157 mg/dL — ABNORMAL HIGH (ref 70–99)
Glucose-Capillary: 29 mg/dL — CL (ref 70–99)
Glucose-Capillary: 70 mg/dL (ref 70–99)
Glucose-Capillary: 56 mg/dL — ABNORMAL LOW (ref 70–99)

## 2018-05-22 MED ORDER — INSULIN NPH (HUMAN) (ISOPHANE) 100 UNIT/ML ~~LOC~~ SUSP
8.0000 [IU] | Freq: Every day | SUBCUTANEOUS | Status: DC
  Administered 2018-05-22: 8 [IU] via SUBCUTANEOUS
  Filled 2018-05-22: qty 10

## 2018-05-22 MED ORDER — CARVEDILOL 12.5 MG PO TABS
12.5000 mg | ORAL_TABLET | Freq: Two times a day (BID) | ORAL | Status: DC
  Administered 2018-05-22: 12.5 mg via ORAL
  Filled 2018-05-22: qty 1

## 2018-05-22 MED ORDER — INSULIN NPH (HUMAN) (ISOPHANE) 100 UNIT/ML ~~LOC~~ SUSP: 8.0000 [IU] | Freq: Two times a day (BID) | SUBCUTANEOUS | 5 refills | Status: AC

## 2018-05-22 MED ORDER — INSULIN NPH (HUMAN) (ISOPHANE) 100 UNIT/ML ~~LOC~~ SUSP
8.0000 [IU] | Freq: Every day | SUBCUTANEOUS | Status: DC
  Filled 2018-05-22: qty 10

## 2018-05-22 MED ORDER — DEXTROSE 50 % IV SOLN
INTRAVENOUS | Status: AC
Start: 2018-05-22 — End: 2018-05-22
  Administered 2018-05-22: 50 mL
  Filled 2018-05-22: qty 50

## 2018-05-22 NOTE — Progress Notes (Signed)
Pt fully alert and oriented and following commands. Orange juice given and will recheck BG again. Dinner arriving soon.

## 2018-05-22 NOTE — Progress Notes (Signed)
Spoke with [name], regarding discharge orders and instructions. Daughter states she will be able to pick up pt after work around 5:30 pm.

## 2018-05-22 NOTE — Progress Notes (Signed)
Palliative: [name] is resting quietly in bed.  She greets me making and keeping eye contact.  She appears relatively frail, but is alert and oriented x3.  There is no family at bedside at this time. We talked about her hospitalization, her heart problems.  I share that she had an MI, got heparin, her blood levels got low and she also got blood.  She states understanding.  [name] and I talk about returning to the hospital if, when she gets sick again.  She states that at this point, she is unsure if she would return.  We talked about respecting what she wants, what is important to her.  I shared that I understand she is ready to return home, but I want to make sure she has the help that she needs.  I share that her sister [name] should be leaving sometime today or tomorrow, but her daughter [name] "Sarina Ill" has someone that will be coming into watch after her while she is at work.  [name] states that she lives with her daughter, who works from [employer] as a Barrister's clerk.  I share that I prefer to have conversations with family present, but I want to help make a plan so that she may leave the hospital.  We talked about an extra layer of help at home including a registered nurse to come that look after her, a CNA to help with bathing, chaplain and social worker if needed, all at no cost.  [name] states that she would like the services.  [name] states that she has had this service in the past through home health.  I share that this service is called hospice.  [name] states that she does not want to "go to hospice".  I shared with her that hospice wants to care for her in her home, not take her somewhere else.  We talked about the benefits of hospice, "doing it her way".  I shared that hospice will care for her, continue to treat the treatable, continue to give her medicines, not give her morphine to put her to sleep and kill her.  [name] starts talking about her faith.   She states that she has been going to her regular church since 1982, she has good support through "brother Eddie Dibbles".  [name] begins a life review.  She tells the story of her mother's passing.  Her mother lived in her home and died at age 70 at [facility], approximately 10 years ago.  [name] also shares that her husband died approximately 27 years ago with a heart attack, he was found asleep on the couch, peaceful, he had a heart attack.  [name] also shares that she lost a daughter approximately 3 years ago, and has another disabled child at age 12 in an ALF.  She goes further to tell them that she has funeral arrangements made and caskets paid for for both she and her disabled child.  She tells me in detail about her children, their lives, their children.  She agrees for me to call her daughter [name].  Call to [place] at [phone], voicemail full, unable to accept messages.  Return call from daughter.  Toney Rakes states that [name] has gone through a whole bottle of NTG in one month.  She also gives out quickly when walking.  Has been seeing/asking after others who have left. Daughter states that she has told her and her pastor that she is "ready  to go". We talk about re hospitalization and preferred place of death.  Veranda states that [name] was active with her [place] approximately 1-1/2 years ago.  Veranda agrees to [facility] for "treat the treatable" in home hospice.  All questions answered, no concerns.  Prognosis, not discussed with patient, 6 months or less would not be surprising based on cardiac history, functional status, chronic kidney disease, congestive heart failure, mild dementia, patient's desire to not have aggressive interventions such as cardiac cath, requesting conservative management for her health.   Consultation with hospitalist related to plan of care, disposition. Consultation with case management related to plan of care,  hospice referral, disposition.  55 minutes, extended time Quinn Axe, NP Palliative Medicine Team Team Phone # [phone]

## 2018-05-22 NOTE — Care Management (Addendum)
Per Palliative NP, referral is to be made to [facility] for in home evaluation. See NP note.   Patient discharging home today.

## 2018-05-22 NOTE — Progress Notes (Signed)
Hypoglycemic Event  CBG: 29  Treatment: Dextrose 18mL  Symptoms: Lethargic  Follow-up CBG: Time:0515 CBG Result: 157  Possible Reasons for Event:   Comments/MD notified: Dr. Gaspar Garbe

## 2018-05-22 NOTE — Care Management (Signed)
Referral has been received by [facility]. [facility] [place] will contact family to schedule an in home evaluation.

## 2018-05-22 NOTE — Discharge Summary (Signed)
Physician Discharge Summary  [name] DJM:[id number] DOB: 01-Feb-1932 DOA: 05/18/2018  PCP: Timmothy Euler, MD  Admit date: 05/18/2018 Discharge date: 05/22/2018  Admitted From: home Disposition: home  Recommendations for Outpatient Follow-up:  1. [facility] up with [facility]   Home Health: Home hospice Equipment/Devices: None  Discharge Condition: Guarded CODE STATUS: DNR Diet recommendation: Heart Healthy / Carb Modified    Discharge Diagnoses:  Principal Problem:   NSTEMI (non-ST elevated myocardial infarction) ([place])  Active Problems: Accelerated hypertension   Hyperlipidemia   Cerebrovascular disease   HTN (hypertension)   Chronic kidney disease, stage IV (severe) (HCC) Type 2 diabetes mellitus with hypoglycemia   CAD (coronary artery disease), native coronary artery   Palliative care by specialist   Goals of care, counseling/discussion   Encounter for hospice care discussion  Brief narrative/HPI Please refer to admission H&P for details, in brief,84 year old female with chronic kidney disease stage IV, insulin-dependent diabetes mellitus, hypertension, dyslipidemia, dementia, history of CVA, DM, CAD with history of MI and refused aggressive intervention including cardiac cath in the past was brought by EMS for chest pain on the afternoon of admission. She took 3 nitroglycerin at home with minimal relief and also took a full dose of aspirin. Reported pain to be pressure-like in the substernal area without any radiation. She was found to have elevated blood pressure readings and started on nitroglycerin drip with some improvement. She was found to have elevated troponin initially of 0.05 then subsequent of 0.15 and was started on heparin drip. Other labs showed potassium of 5.4. CT of the abdomen and pelvis without contrast showed no acute findings. Patient admitted to stepdown unit for NSTEMI.    Hospital course   Principal Problem: NSTEMI  (non-ST elevated myocardial infarction) ([place])  Placed on IV heparin, discontinued due to dropping H&H.  2D echo with normal EF and no wall motion abnormality.  Was on nitroglycerin drip for hypertensive urgency, now discontinued. Declined cardiac cath in the past and insists on not wanting any aggressive measures, agitated on wanting to go home and would like to pursue comfort measures.   Goals of care discussion Palliative care involved in goals of care discussion.  After detailed discussion patient wants to go home and declines any aggressive treatment.  Patient will be followed by [facility].  Does not want any aggressive tests or procedure and prefers not to be rehospitalized.  Daughter prefers treating the treatable.  Overall long-term prognosis is guarded with her multiple comorbidities.    Active Problems: Hypertensive urgency/tolerated hypertension Fluctuating blood pressure of highs and lows.  Discontinue nitroglycerin drip.    Now stable.  Continue home dose Coreg, Imdur and clonidine.   Iron deficiency anemia with drop in hemoglobin. Hemoglobin dropped to 6.4, baseline around 7-8.  No signs of GI bleed.  Transfused 1 unit PRBC with good response.  Continue iron supplement  History of CVA with mild vascular dementia Continue aspirin, Plavix and statin.    Chronic kidney disease, stage IV (severe) (HCC) Was evaluated by nephrology in the past and discussed dialysis options. Appears that patient has refused dialysis.  Insulin-dependent diabetes mellitus with hypoglycemia CBG dropped to 29 early this morning.  Asymptomatic.  Was getting NPH along with bedtime Lantus.  Lantus has been discontinued.  Reduced NPH dose to 8 units twice daily.  Chronic diastolic CHF Euvolemic.    Family Communication :  Discussed with daughter  Disposition Plan:Home  with home hospice  Consults :None  Procedures2D echo, CT of the  abdomen        Discharge Instructions   Allergies as of 05/22/2018      Reactions   Lovastatin    Propoxyphene N-acetaminophen    Pt does not know reaction   Simvastatin Other (See Comments)   headache      Medication List    TAKE these medications   aspirin 81 MG EC tablet Take 1 tablet (81 mg total) by mouth daily.   carvedilol 12.5 MG tablet Commonly known as:  COREG TAKE  (1)  TABLET TWICE A DAY.   cholecalciferol 1000 units tablet Commonly known as:  VITAMIN D Take 1,000 Units by mouth daily.   cloNIDine 0.2 MG tablet Commonly known as:  CATAPRES Take 0.2 mg by mouth 3 (three) times daily.   clopidogrel 75 MG tablet Commonly known as:  PLAVIX TAKE 1 TABLET DAILY   glucose blood test strip Commonly known as:  ACCU-CHEK AVIVA PLUS CHECK BLOOD SUGER UP TO 3 TIMES A DAY   hydrALAZINE 50 MG tablet Commonly known as:  APRESOLINE Take 1 tablet (50 mg total) by mouth every 8 (eight) hours.   insulin NPH Human 100 UNIT/ML injection Commonly known as:  NOVOLIN N Inject 0.08 mLs (8 Units total) into the skin 2 (two) times daily before a meal. What changed:  how much to take   isosorbide mononitrate 120 MG 24 hr tablet Commonly known as:  IMDUR Take 1 tablet (120 mg total) by mouth daily.   levothyroxine 50 MCG tablet Commonly known as:  SYNTHROID, LEVOTHROID TAKE 1 TABLET DAILY   nitroGLYCERIN 0.4 MG SL tablet Commonly known as:  NITROSTAT PLACE 1 TABLET UNDER THE TONGUE AT ONSET OF CHEST PAIN EVERY 5 MINTUES UP TO 3 TIMES AS NEEDED   pantoprazole 40 MG tablet Commonly known as:  PROTONIX TAKE 1 TABLET DAILY   rosuvastatin 10 MG tablet Commonly known as:  CRESTOR Take 10 mg by mouth daily.   torsemide 20 MG tablet Commonly known as:  DEMADEX Take 1 tablet (20 mg total) by mouth 2 (two) times daily.      Follow-up Information    Timmothy Euler, MD .   Specialty:  Family Medicine Contact information: [place]  [address] [phone]        [facility] Follow up.          Allergies  Allergen Reactions  . Lovastatin   . Propoxyphene N-Acetaminophen     Pt does not know reaction  . Simvastatin Other (See Comments)    headache      Procedures/Studies: Ct Abdomen Pelvis Wo Contrast  Result Date: 05/18/2018 CLINICAL DATA:  Umbilical and lower abdominal pain EXAM: CT ABDOMEN AND PELVIS WITHOUT CONTRAST TECHNIQUE: Multidetector CT imaging of the abdomen and pelvis was performed following the standard protocol without IV contrast. COMPARISON:  CT 03/04/2017, 05/14/2013 FINDINGS: Lower chest: Lung bases demonstrate patchy dependent atelectasis. Trace pleural effusions. Dilated pulmonary trunk measuring up to 4.5 cm. Aortic atherosclerosis. Extensive coronary vascular calcification. Mild cardiomegaly. No large pericardial effusion. Hepatobiliary: No focal liver abnormality is seen. No gallstones, gallbladder wall thickening, or biliary dilatation. Pancreas: Unremarkable. No pancreatic ductal dilatation or surrounding inflammatory changes. Spleen: Normal in size without focal abnormality. Adrenals/Urinary Tract: Adrenal glands are normal. Complex cyst upper pole left kidney with peripheral calcification measuring 18 mm, no change in size but increased calcification. Atrophic kidneys. Probable parapelvic cysts on the left. Bladder normal. Stomach/Bowel: The  stomach is nonenlarged. No dilated small bowel. No colon wall thickening. Status post appendectomy. Sigmoid colon diverticular disease without acute inflammation. Moderate feces in the rectum Vascular/Lymphatic: Extensive aortic atherosclerosis. No aneurysmal dilatation. No significantly enlarged lymph nodes. Reproductive: Enlarged uterus for age. Stable 4.6 cm left adnexal mass presumably an exophytic fibroid given lack of significant interval change. Other: Negative for free air or free fluid. Fat in the umbilical region. Musculoskeletal: Grade 1  anterolisthesis L3 on L4 with associated degenerative changes. No acute or suspicious abnormality. Old sternal fracture. IMPRESSION: 1. No definite CT evidence for acute intra-abdominal or pelvic abnormality. 2. Trace pleural effusions. Cardiomegaly. Dilated pulmonary arterial trunk suggesting pulmonary hypertension. 3. Sigmoid colon diverticular disease without acute inflammation 4. Atrophic kidneys 5. Enlarged uterus for age. Similar appearance of probable left exophytic fibroid. Electronically Signed   By: Donavan Foil M.D.   On: 05/18/2018 23:12   Dg Chest 2 View  Result Date: 05/18/2018 CLINICAL DATA:  84 year old female with chest pain since 1400 hours. EXAM: CHEST - 2 VIEW COMPARISON:  Chest radiographs 03/05/2018 and earlier. FINDINGS: Upright AP and lateral views of chest. Stable lung volumes. Possible small pleural effusions. Stable pulmonary vascularity, no acute edema. Stable cardiomegaly and mediastinal contours. Calcified aortic atherosclerosis. Visualized tracheal air column is within normal limits. No pneumothorax. No acute osseous abnormality identified. Negative visible bowel gas pattern. IMPRESSION: 1. Possible small pleural effusions, not significantly changed since April. No acute cardiopulmonary abnormality. 2. Chronic cardiomegaly, Aortic Atherosclerosis (ICD10-I70.0). Electronically Signed   By: Genevie Ann M.D.   On: 05/18/2018 20:07     Subjective: Denies any chest pain symptoms.  Noted for drop in CBG this morning.  Asymptomatic.  Given D50.  Discharge Exam: Vitals:   05/22/18 0948 05/22/18 1100  BP: (!) 190/60 (!) 181/51  Pulse:  (!) 56  Resp:  12  Temp:    SpO2:  100%   Vitals:   05/22/18 0800 05/22/18 0803 05/22/18 0948 05/22/18 1100  BP: (!) 189/50 (!) 189/50 (!) 190/60 (!) 181/51  Pulse: (!) 54 (!) 54  (!) 56  Resp: 12   12  Temp:  97.7 F (36.5 C)    TempSrc:  Oral    SpO2: 100%   100%  Weight:      Height:        General: Not in distress HEENT: Moist  mucosa, supple neck Chest: Clear bilaterally CVS: Normal S1-S2, no murmurs GI: Soft, nondistended, nontender Musculoskeletal: Warm, no edema   The results of significant diagnostics from this hospitalization (including imaging, microbiology, ancillary and laboratory) are listed below for reference.     Microbiology: Recent Results (from the past 240 hour(s))  MRSA PCR Screening     Status: None   Collection Time: 05/19/18  6:22 AM  Result Value Ref Range Status   MRSA by PCR NEGATIVE NEGATIVE Final    Comment:        The GeneXpert MRSA Assay (FDA approved for NASAL specimens only), is one component of a comprehensive MRSA colonization surveillance program. It is not intended to diagnose MRSA infection nor to guide or monitor treatment for MRSA infections. Performed at [facility], [address], [place], [place] [address]      Labs: BNP (last 3 results) Recent Labs    09/20/17 1940  BNP 2,831.5*   Basic Metabolic Panel: Recent Labs  Lab 05/18/18 1939 05/19/18 1053 05/22/18 0420  NA 137 139 137  K 5.4* 4.5 4.2  CL 106 107 104  CO2 23 22 25   GLUCOSE 96 122* 37*  BUN 75* 74* 70*  CREATININE 3.88* 3.81* 3.81*  CALCIUM 9.0 9.2 9.2   Liver Function Tests: Recent Labs  Lab 05/18/18 1939  AST 12*  ALT 7*  ALKPHOS 38  BILITOT 0.6  PROT 5.6*  ALBUMIN 3.0*   Recent Labs  Lab 05/18/18 1939  LIPASE 31   No results for input(s): AMMONIA in the last 168 hours. CBC: Recent Labs  Lab 05/18/18 1939 05/19/18 1053 05/21/18 0516 05/22/18 0420  WBC 3.7* 4.6 3.9*  --   NEUTROABS 2.6  --   --   --   HGB 7.6* 7.8* 6.4* 8.3*  HCT 23.7* 23.9* 19.9* 25.3*  MCV 90.8 90.2 90.5  --   PLT 161 155 151  --    Cardiac Enzymes: Recent Labs  Lab 05/18/18 1939 05/18/18 2324 05/19/18 1053 05/19/18 1837  TROPONINI 0.05* 0.15* 0.13* 0.13*   BNP: Invalid input(s): POCBNP CBG: Recent Labs  Lab 05/21/18 2110 05/22/18 0455 05/22/18 0516 05/22/18 0753  05/22/18 1133  GLUCAP 97 29* 157* 70 113*   D-Dimer No results for input(s): DDIMER in the last 72 hours. Hgb A1c No results for input(s): HGBA1C in the last 72 hours. Lipid Profile No results for input(s): CHOL, HDL, LDLCALC, TRIG, CHOLHDL, LDLDIRECT in the last 72 hours. Thyroid function studies No results for input(s): TSH, T4TOTAL, T3FREE, THYROIDAB in the last 72 hours.  Invalid input(s): FREET3 Anemia work up No results for input(s): VITAMINB12, FOLATE, FERRITIN, TIBC, IRON, RETICCTPCT in the last 72 hours. Urinalysis    Component Value Date/Time   COLORURINE YELLOW 10/15/2017 0910   APPEARANCEUR Clear 11/16/2017 1213   LABSPEC 1.012 10/15/2017 0910   PHURINE 5.0 10/15/2017 0910   GLUCOSEU Trace (A) 11/16/2017 1213   HGBUR NEGATIVE 10/15/2017 0910   BILIRUBINUR Negative 11/16/2017 1213   KETONESUR NEGATIVE 10/15/2017 0910   PROTEINUR 3+ (A) 11/16/2017 1213   PROTEINUR 100 (A) 10/15/2017 0910   UROBILINOGEN negative 07/02/2015 1505   UROBILINOGEN 0.2 05/10/2015 1245   NITRITE Negative 11/16/2017 1213   NITRITE NEGATIVE 10/15/2017 0910   LEUKOCYTESUR Trace (A) 11/16/2017 1213   Sepsis Labs Invalid input(s): PROCALCITONIN,  WBC,  LACTICIDVEN Microbiology Recent Results (from the past 240 hour(s))  MRSA PCR Screening     Status: None   Collection Time: 05/19/18  6:22 AM  Result Value Ref Range Status   MRSA by PCR NEGATIVE NEGATIVE Final    Comment:        The GeneXpert MRSA Assay (FDA approved for NASAL specimens only), is one component of a comprehensive MRSA colonization surveillance program. It is not intended to diagnose MRSA infection nor to guide or monitor treatment for MRSA infections. Performed at [facility], [address], [place], [place] [address]      Time coordinating discharge: 35 minutes  SIGNED:   Louellen Molder, MD  [facility] 05/22/2018, 1:11 PM Pager   If 7PM-7AM, please contact  night-coverage [email or web address] Password [email or web address]

## 2018-05-22 NOTE — Progress Notes (Signed)
Hypoglycemic Event  CBG: 36  Treatment: oral intake of coke cola   Symptoms: alert, oriented, following commands. Nonsymptomatic   Follow-up CBG: [id number] CBG Result: 52  Possible Reasons for Event: declined lunch  Comments/MD notified: Dr. Junie Panning Francisco Capuchin

## 2018-05-22 NOTE — Care Management (Signed)
[name], [name] ([id number]) Sex Female DOB 22-Oct-1932  Room Bed  IC06 IC06-01  Patient Demographics   Address [place] [address] Phone [phone] (Home) [phone] (Mobile) *Preferred*  Patient Ethnicity & Race   Ethnic Group Patient Race  Not Hispanic or Latino White or Caucasian  Emergency Contact(s)   Name Relation Home Work [place] Daughter [phone]  [phone]  Tokarz,Charles Son   [phone]  Documents on File    Status Date Received Description  Documents for the Patient  EMR Medication Summary Not Received    EMR Problem Summary Not Received    EMR Patient Summary Not Received    Driver's License Received 89/21/19 wrfm/epc  [place] HIPAA NOTICE OF PRIVACY - Scanned Received 08/25/11   [place] E-Signature HIPAA Notice of Privacy Received 09/15/11   [place] E-Signature HIPAA Notice of Privacy Spanish Not Received    Insurance Card Received 09/15/11   Advance Directives/Living Will/HCPOA/POA Not Received    Financial Application Not Received    [place] Not Received    Insurance Card Not Received    Insurance Card Received 12/06/12   Insurance Card Received 03/01/13 wrfm/epc  Advanced Beneficiary Notice (ABN) Not Received    [place] Not Received    AMB Patient Logs/Info Not Received    Insurance Card Not Received  wrfm/aarp  Insurance Card Received 10/10/13   Insurance Card Received 03/21/14 AARP/WRFM/MM  HIM ROI Authorization  03/28/14   HIM ROI Authorization  06/21/14   HIM ROI Authorization  09/17/14 ECS representing United [employer]  E-Signature AOB Spanish Not Received    Insurance Card Received 12/02/14 WRFM/AARP/MM  AMB Outside ED Note  01/04/15 [facility]  AMB Outside Consult Note  01/04/15 [facility] Record  01/04/15 H&P [place] Authorization  01/26/15 Faxed [phone]  AMB Outside Hospital Record  01/04/15 D/S SUMMARY [facility]  Other Photo ID Not Received    AMB Correspondence  04/14/15 REPORT [facility]HC  AMB Correspondence  05/15/15 06/16: [place] REPORT  HIM ROI Authorization  06/09/15   AMB HH/NH/Hospice  18/56/31 CERTIFICATION/POC [facility] [place]  HIM ROI Authorization (Expired) 07/02/15 Authorization for batch [facility]IOX/UnitedHealthCare     fbg     07/02/15  AMB Correspondence  07/15/15 REQUISITION ORDER [facility]  AMB HH/NH/Hospice  07/14/15 PHYSICIAN COMMUNICATION & INTERIM ORDER [facility]  AMB HH/NH/Hospice  05/26/15 PT D/S VISIT REPORT [facility] [place]  HIM ROI Authorization  11/02/15   Insurance Card Received 10/05/16 WRFM/[facility]  HIM ROI Authorization (Expired) 04/11/16 Authorization for batch [facility]X/United[employer]Care Medicare Risk Adjustment 04/11/16  fbg  AMB HH/NH/Hospice  49/70/26 CERTIFICATION/POC [facility]  Release of Information Received 12/19/16 WRFM  AMB Correspondence  07/28/16 PRIOR AUTHORIZATION [facility]  AMB Correspondence  07/29/16 NOTICE OF DENIAL DHHS  AMB Outside Hospital Record  08/15/16 D/S REPORT [facility] Record  08/12/16 H&P [place]  AMB HH/NH/Hospice  37/85/88 CERTIFICATION/POC [facility]  AMB HH/NH/Hospice  12/12/16 VERBAL ORDER [facility] [place]  AMB Provider Completed Forms  50/27/74 PHY CERTIFICATION/POC [facility]  AMB HH/NH/Hospice  12/87/86 HH CERTIFICATION/POC [facility] [place]  AMB HH/NH/Hospice  01/19/17 01/18 VERBAL ORDERS [facility] [place] E-Signature HIPAA Notice of Privacy Signed 03/04/17   AMB  HH/NH/Hospice  02/15/17 VERBAL ORDER [facility]  AMB HH/NH/Hospice  03/08/17 D/S [facility] [employer] [facility] [place]  Insurance Card Received 03/31/17 AARP  MEDICARE/RCGD/HHS  AMB HH/NH/Hospice  03/08/17 VERBAL ORDER [facility]  Release of Information Received 03/31/17 DPR/NS & CX FORM/RCGD/HHS  Insurance Card Received 09/18/17 WRFM/UHC MEDICARE  HIM ROI Authorization  10/06/17 [facility]  AMB HH/NH/Hospice Received 10/10/17 ORDER [facility]  AMB HH/NH/Hospice Received 09/29/17 PROFESSIONAL COMMUNICATION [facility]  AMB HH/NH/Hospice Received 10/25/17 11/18 ORDERS [facility]  AMB HH/NH/Hospice Received 10/04/17 PROFESSIONAL COMMUNICATION [facility]  AMB Provider Completed Forms Received 10/23/17 EXAMINATION [employer] [facility]  AMB HH/NH/Hospice Received 95/63/87 CERTIFICATION/POC [facility]  AMB HH/NH/Hospice Received 10/20/17 PROFESSIONAL COMMUNICATION [facility]  AMB HH/NH/Hospice Received 10/20/17 ORDER [facility]  HIM ROI Authorization  11/16/17 IP STAY RECORDS REQEUSTED FOR DOS 10/15/17-10/18/17  AMB HH/NH/Hospice Received 10/23/17 ORDER [facility]  Insurance Card Received 12/01/17 aarp uhc mcr/wrfm  AMB Provider Completed Forms Received 12/28/17 PROFESSIONAL COMMUNICATION [facility]  AMB HH/NH/Hospice Received 56/43/32 CERTIFICATION/POC [facility]  AMB HH/NH/Hospice Received 01/01/18 ORDER [facility]  AMB HH/NH/Hospice Received 01/15/18 ORDER [facility]  AMB HH/NH/Hospice Received 01/10/18 ORDERS [facility]  AMB HH/NH/Hospice Received 03/13/18 PROFESSIONAL COMMUNICATION [facility]  AMB HH/NH/Hospice Received 03/14/18 ORDERS [facility]  AMB HH/NH/Hospice Received 95/18/84 CERTIFICATION&POC [facility]  Insurance Card  04/13/18   HIM ROI Authorization  05/10/18 HMS  AMB Correspondence (Deleted) 01/04/15 D/S SUMMARY [facility]  AMB Correspondence (Deleted) 07/15/15 REQUISITION [facility]  AMB HH/NH/Hospice (Deleted) 07/14/15  PHYSICIAN COMMUNICATION & NTERIM ORDER [facility]  AMB Correspondence (Deleted) 08/03/16 PRIOR AUTHORIZATION [facility]  AMB Outside Hospital Record (Deleted) 12/12/16 D/S [facility] [place] [facility]  HIM Release of Information Output  10/06/17 Entire Encounter  HIM Release of Information Output  11/16/17 Entire Encounter - No Scans  HIM Release of Information Output (Deleted) 05/10/18 Requested records  Documents for the Encounter  AOB (Assignment of Insurance Benefits) Not Received    E-signature AOB Signed 05/18/18   MEDICARE RIGHTS Not Received    E-signature Medicare Rights Signed 05/18/18   ED Patient Billing Extract   ED PB Billing Extract  EMS Run Sheet Received 05/19/18   Admission Information   Attending Provider Admitting Provider Admission Type Admission Date/Time  Dhungel, Flonnie Overman, MD Rodena Goldmann, DO  05/18/18 1747  Discharge Date Hospital Service Auth/Cert Status Service Area   Internal Medicine Incomplete [facility]  Unit Room/Bed Admission Status   AP-ICCUP NURSING IC06/IC06-01 Admission (Confirmed)   Admission   Complaint  .  Hospital Account   Name Acct ID Class Status Primary Coverage  [name], [name] [id number] Inpatient Open [place]      Guarantor Account (for Hospital Account [id number])   Name Relation to Pt Service Area Active? Acct Type  Maj, [name] Self CHSA Yes Personal/Family  Address Phone    [place], [place] [address] [phone])        Coverage Information (for Hospital Account [id number])   F/O Payor/Plan Precert #  [facility] [place] #  [name], [name] [id number]  [place]  [address] [address] [phone]

## 2018-05-23 ENCOUNTER — Other Ambulatory Visit: Payer: Self-pay | Admitting: *Deleted

## 2018-05-23 ENCOUNTER — Encounter: Payer: Self-pay | Admitting: *Deleted

## 2018-05-23 NOTE — Patient Outreach (Signed)
Pt hospitalized 6/21-6/25/19 with NSTEMI, pt with history DM, CAD, CKD stage 4, HTN, per hospital notes, pt refused aggressive measures, refused cardiac cath, spoke with pt, HIPAA verified, also spoke with daughter [name] who reports pt now has hospice at home and RN saw pt today and enrolled with their services, [place] appreciative of [facility] support, reports pt has medications and all other needs met.  Close case, mailed case closure letter to patient's home and faxed case closure to primary care MD Dr. Kenn File.  PLAN Case closed today  Jacqlyn Larsen [facility], BSN [place] Coordinator [phone]

## 2018-05-24 ENCOUNTER — Other Ambulatory Visit: Payer: Self-pay

## 2018-05-24 NOTE — Patient Outreach (Signed)
[place] [facility]) Care Management  05/24/2018  [name] 17-Jun-1932 [id number]   Received EMMI campaign referral.  Patient thought to be active with community nurse.  Advised community nurse [name] of referral.  She stated that the patient is hospice now and that [facility] is no longer following the patient.  Plan: RN CM will make note of this a close case.  Shelby Baseman, RN, MSN [facility] Care Management Care Management Coordinator Direct Line [phone] Toll Free: [phone]  Fax: [phone]

## 2018-05-29 DIAGNOSIS — R402441 Other coma, without documented Glasgow coma scale score, or with partial score reported, in the field [EMT or ambulance]: Secondary | ICD-10-CM | POA: Diagnosis not present

## 2018-05-29 DIAGNOSIS — Z7401 Bed confinement status: Secondary | ICD-10-CM | POA: Diagnosis not present

## 2018-06-05 ENCOUNTER — Ambulatory Visit: Payer: Medicare Other | Admitting: *Deleted

## 2018-06-21 ENCOUNTER — Other Ambulatory Visit (HOSPITAL_COMMUNITY): Payer: Medicare Other

## 2018-06-28 ENCOUNTER — Ambulatory Visit (HOSPITAL_COMMUNITY): Payer: Medicare Other | Admitting: Internal Medicine

## 2018-06-28 DEATH — deceased

## 2019-04-10 IMAGING — DX DG CHEST 2V
2 series · 2 of 2 positions shown · non-contrast
Comparison: Chest radiograph January 30, 2017 and CT chest March 04, 2017

CLINICAL DATA: Intermittent chest pain for month.  History of CHF.

EXAM:
CHEST - 2 VIEW

[chest pa]
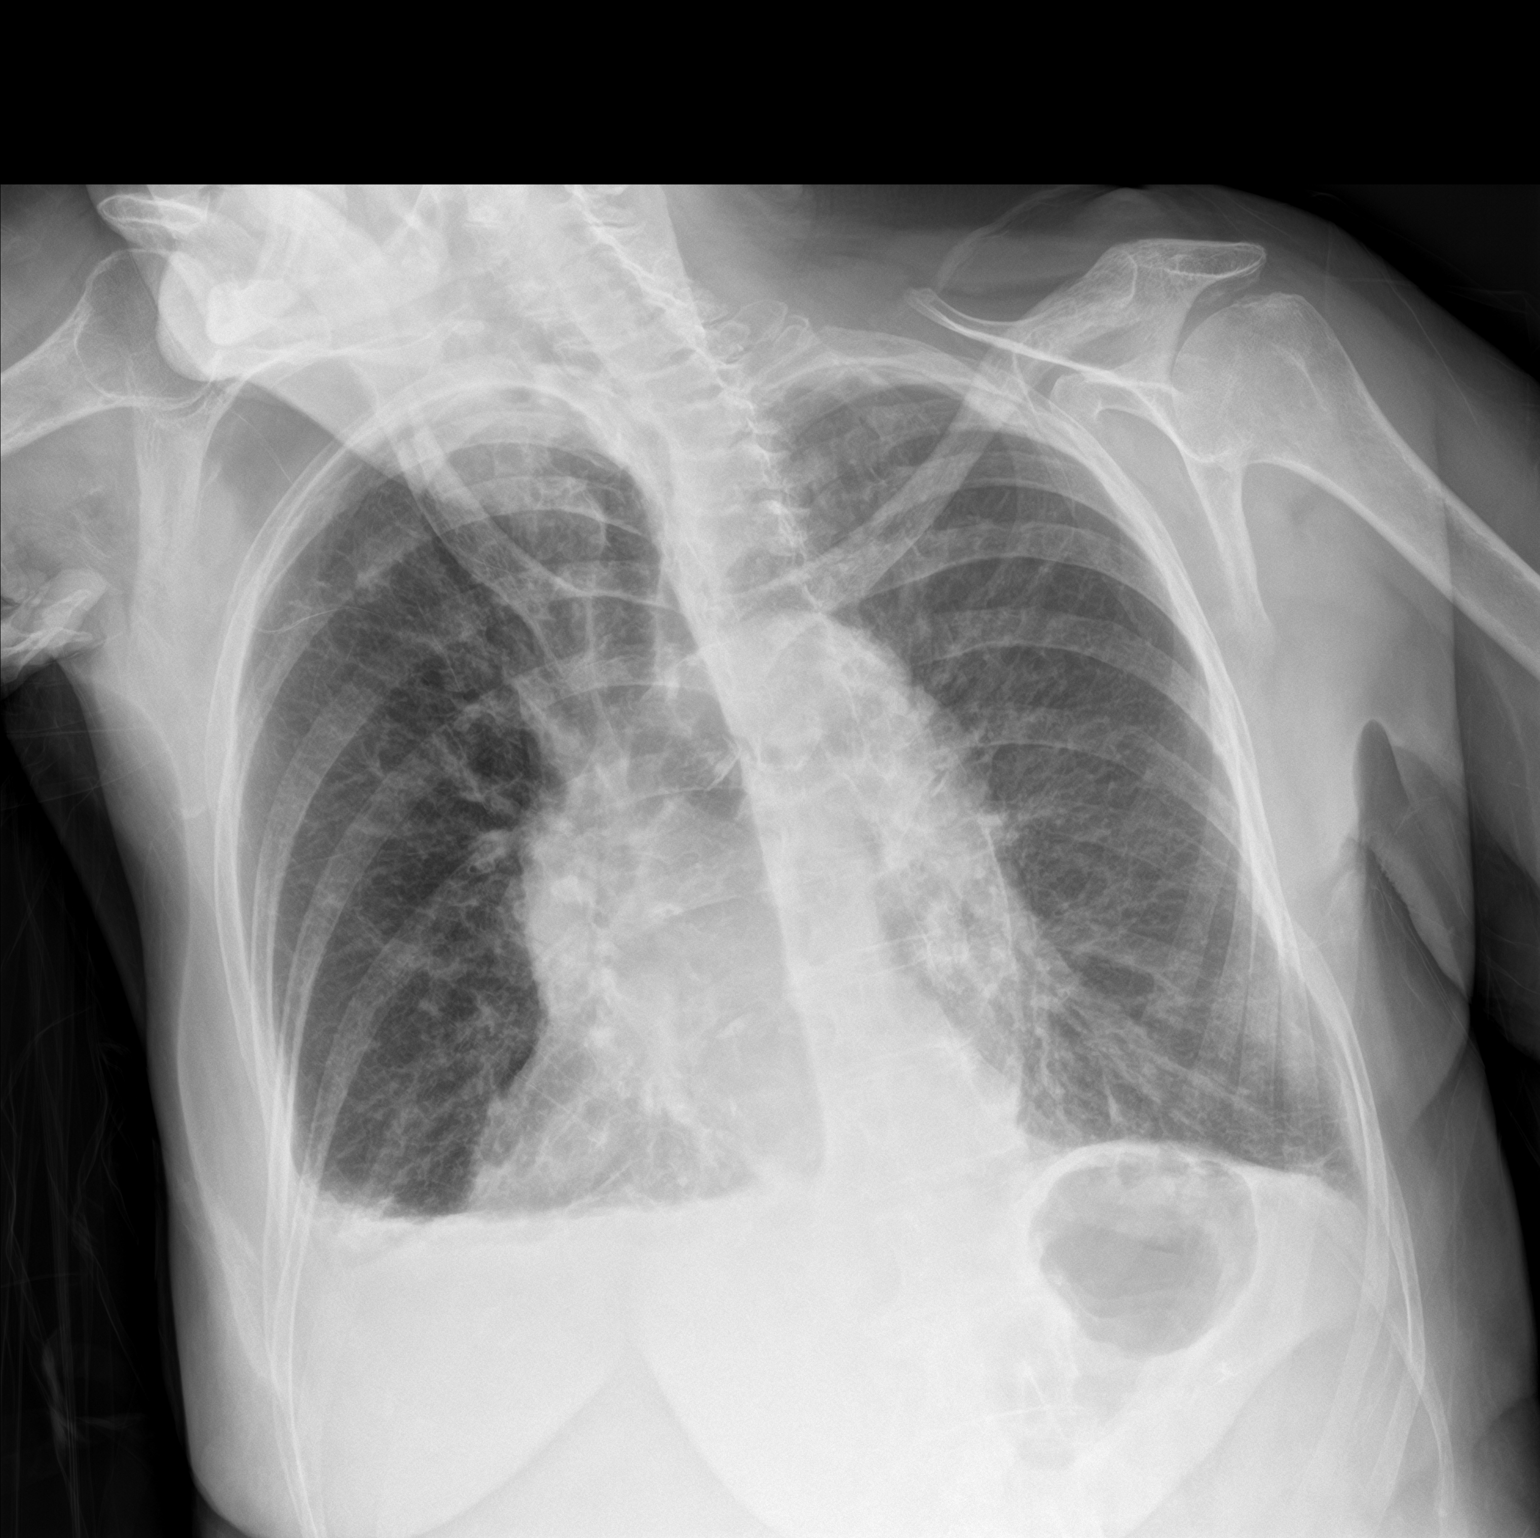

[chest lat]
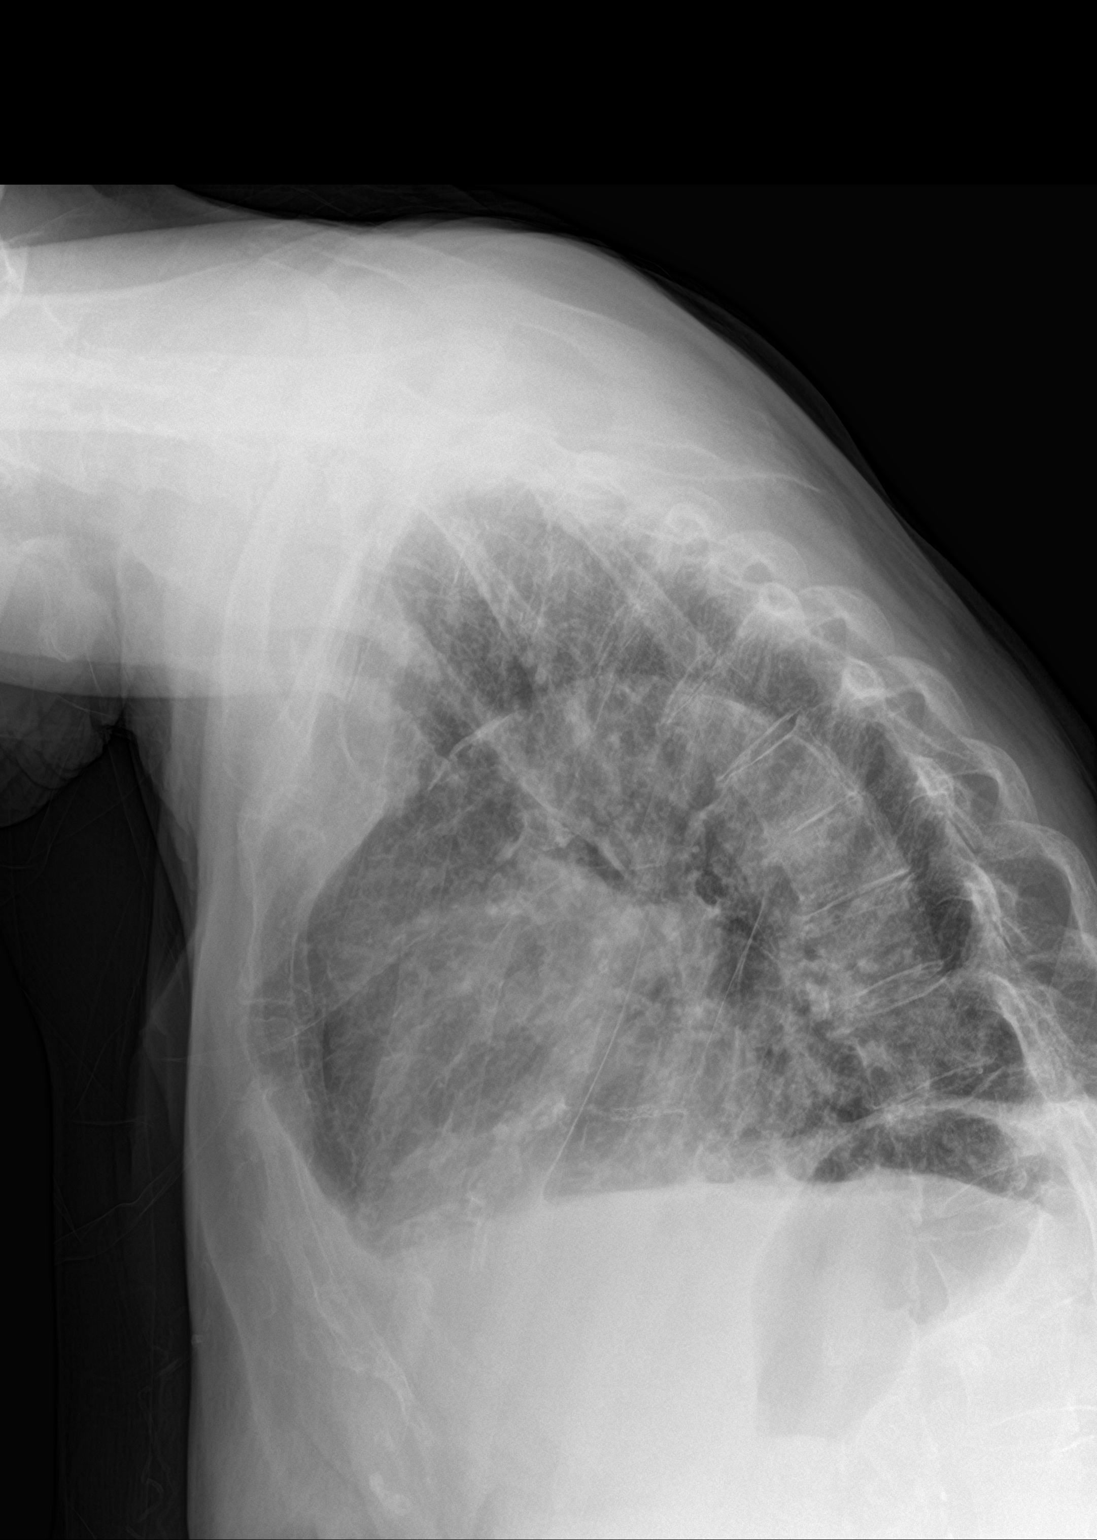

[2 of 2 positions shown; findings below may reference images not displayed]

FINDINGS: Cardiac silhouette is upper limits of normal in size. Calcified
aortic arch. Mild chronic interstitial changes with bibasilar
strandy densities in tiny pleural effusions. No focal consolidation.
Similar nodular scarring versus confluence of vascular shadows
projecting RIGHT upper lobe. No pneumothorax. Soft tissue planes
included osseous structures are nonsuspicious.
IMPRESSION: Stable borderline cardiomegaly. Stable chronic interstitial changes
with tiny pleural effusions.

Aortic Atherosclerosis ([id number]).
